# Patient Record
Sex: Male | Born: 1950
Health system: Southern US, Community
[De-identification: ages and names within clinical notes are randomized; demographics above are authoritative.]

## PROBLEM LIST (undated history)

## (undated) DIAGNOSIS — C3490 Malignant neoplasm of unspecified part of unspecified bronchus or lung: Secondary | ICD-10-CM

## (undated) DIAGNOSIS — T8859XA Other complications of anesthesia, initial encounter: Secondary | ICD-10-CM

## (undated) DIAGNOSIS — F1910 Other psychoactive substance abuse, uncomplicated: Secondary | ICD-10-CM

## (undated) DIAGNOSIS — I10 Essential (primary) hypertension: Secondary | ICD-10-CM

## (undated) DIAGNOSIS — T4145XA Adverse effect of unspecified anesthetic, initial encounter: Secondary | ICD-10-CM

## (undated) HISTORY — PX: BACK SURGERY: SHX140

## (undated) HISTORY — PX: ARM DEBRIDEMENT: SHX890

## (undated) HISTORY — DX: Malignant neoplasm of unspecified part of unspecified bronchus or lung: C34.90

---
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
## 2015-12-25 ENCOUNTER — Emergency Department
Admission: EM | Admit: 2015-12-25 | Discharge: 2015-12-25 | Disposition: A | Discharge status: Home / Self Care | Payer: Self-pay | Attending: Emergency Medicine | Admitting: Emergency Medicine

## 2015-12-25 ENCOUNTER — Encounter: Payer: Self-pay | Admitting: Emergency Medicine

## 2015-12-25 ENCOUNTER — Emergency Department: Payer: Self-pay

## 2015-12-25 DIAGNOSIS — F17200 Nicotine dependence, unspecified, uncomplicated: Secondary | ICD-10-CM | POA: Insufficient documentation

## 2015-12-25 DIAGNOSIS — L03011 Cellulitis of right finger: Secondary | ICD-10-CM | POA: Insufficient documentation

## 2015-12-25 DIAGNOSIS — R609 Edema, unspecified: Secondary | ICD-10-CM

## 2015-12-25 LAB — CBC WITH DIFFERENTIAL/PLATELET
BASOS ABS: 0 10*3/uL (ref 0–0.1)
BASOS PCT: 0 %
EOS PCT: 0 %
Eosinophils Absolute: 0 10*3/uL (ref 0–0.7)
HEMATOCRIT: 41.8 % (ref 40.0–52.0)
Hemoglobin: 14.8 g/dL (ref 13.0–18.0)
LYMPHS PCT: 9 %
Lymphs Abs: 1 10*3/uL (ref 1.0–3.6)
MCH: 34.6 pg — ABNORMAL HIGH (ref 26.0–34.0)
MCHC: 35.3 g/dL (ref 32.0–36.0)
MCV: 97.8 fL (ref 80.0–100.0)
MONO ABS: 0.9 10*3/uL (ref 0.2–1.0)
MONOS PCT: 8 %
NEUTROS ABS: 9.5 10*3/uL — AB (ref 1.4–6.5)
Neutrophils Relative %: 83 %
PLATELETS: 222 10*3/uL (ref 150–440)
RBC: 4.27 MIL/uL — ABNORMAL LOW (ref 4.40–5.90)
RDW: 12.4 % (ref 11.5–14.5)
WBC: 11.4 10*3/uL — ABNORMAL HIGH (ref 3.8–10.6)

## 2015-12-25 LAB — BASIC METABOLIC PANEL
ANION GAP: 7 (ref 5–15)
BUN: 11 mg/dL (ref 6–20)
CALCIUM: 9.3 mg/dL (ref 8.9–10.3)
CO2: 27 mmol/L (ref 22–32)
Chloride: 94 mmol/L — ABNORMAL LOW (ref 101–111)
Creatinine, Ser: 0.57 mg/dL — ABNORMAL LOW (ref 0.61–1.24)
GFR calc Af Amer: >60 mL/min (ref >60)
GLUCOSE: 106 mg/dL — AB (ref 65–99)
Potassium: 4.5 mmol/L (ref 3.5–5.1)
Sodium: 128 mmol/L — ABNORMAL LOW (ref 135–145)

## 2015-12-25 MED ORDER — IBUPROFEN 600 MG PO TABS: 600.0000 mg | ORAL_TABLET | Freq: Four times a day (QID) | ORAL | 0 refills | Status: DC | PRN

## 2015-12-25 MED ORDER — SODIUM CHLORIDE 0.9 % IV BOLUS (SEPSIS)
1000.0000 mL | Freq: Once | INTRAVENOUS | Status: AC
  Administered 2015-12-25: 1000 mL via INTRAVENOUS

## 2015-12-25 MED ORDER — CLINDAMYCIN PHOSPHATE 600 MG/50ML IV SOLN
600.0000 mg | Freq: Once | INTRAVENOUS | Status: AC
  Administered 2015-12-25: 600 mg via INTRAVENOUS

## 2015-12-25 MED ORDER — CLINDAMYCIN HCL 300 MG PO CAPS: 300.0000 mg | ORAL_CAPSULE | Freq: Three times a day (TID) | ORAL | 0 refills | Status: DC

## 2015-12-25 MED ORDER — CEPHALEXIN 500 MG PO CAPS: 500.0000 mg | ORAL_CAPSULE | Freq: Three times a day (TID) | ORAL | 0 refills | Status: AC

## 2015-12-25 MED ORDER — CLINDAMYCIN PHOSPHATE 600 MG/50ML IV SOLN
INTRAVENOUS | Status: AC
  Administered 2015-12-25: 600 mg via INTRAVENOUS
  Filled 2015-12-25: qty 50

## 2015-12-25 NOTE — ED Provider Notes (Signed)
[facility] Emergency Department Provider Note  ____________________________________________  Time seen: Approximately 10:19 AM  I have reviewed the triage vital signs and the nursing notes.   HISTORY  Chief Complaint Finger Injury    HPI Scott Jordan is a 65 y.o. male, NAD, presents to the emergency department with 1 week history of a painful, swollen, and red right middle finger.  Patient states that roughly 2 months ago he was stuck by a cactus needle at the end of the right middle digit and believes he may not have gotten the needle out.  Over the last week, he noticed that the site of where the cactus stuck him was draining pus and blood. Swelling, redness and pain about the finger increased over the last few days to the point it was impacting his work and his supervisor sent him to the ED for evaluation. Patient states that the pain decreases with elevation of his right hand.  He has tried icing, heating, and rinsing with salt water without relief of symptoms.  He reports decreased ROM of the right middle digit due to swelling, with full ROM of the surrounding digits and hand.  He denies fever, chills, body aches, abdominal pain, nausea, vomiting, numbness, weakness, tingling, chest pain, palpitations, nor shortness of breath. States that he works with his hands and has had multiple minor injuries to his hands and fingers.   History reviewed. No pertinent past medical history.  There are no active problems to display for this patient.   History reviewed. No pertinent surgical history.  Prior to Admission medications   Medication Sig Start Date End Date Taking? Authorizing Provider  cephALEXin (KEFLEX) 500 MG capsule Take 1 capsule (500 mg total) by mouth 3 (three) times daily. 12/25/15 01/04/16  Marqual Mi L Arletta Lumadue, PA-C  clindamycin (CLEOCIN) 300 MG capsule Take 1 capsule (300 mg total) by mouth 3 (three) times daily. 12/25/15   Monica Zahler L Dalanie Kisner, PA-C  ibuprofen  (ADVIL,MOTRIN) 600 MG tablet Take 1 tablet (600 mg total) by mouth every 6 (six) hours as needed. 12/25/15   Kace Hartje L Nafisa Olds, PA-C    Allergies Review of patient's allergies indicates no known allergies.  No family history on file.  Social History Social History  Substance Use Topics  . Smoking status: Current Every Day Smoker  . Smokeless tobacco: Never Used  . Alcohol use Not on file     Review of Systems  Constitutional: No fever/chills, Fatigue Cardiovascular: No chest pain. No palpitations. Respiratory: No shortness of breath.  Gastrointestinal:  No abdominal pain, nausea, vomiting Musculoskeletal: Positive for pain of the right middle digit causing decreased range of motion.  Negative for right shoulder, elbow, wrist, hand pain. Skin: Positive for redness, swelling, skin sores of right middle phalanx. No rash. Neurological: Negative for numbness, weakness, tingling. 10-point ROS otherwise negative.  ____________________________________________   PHYSICAL EXAM:  VITAL SIGNS: ED Triage Vitals  Enc Vitals Group     BP 12/25/15 0949 (!) 173/110     Pulse Rate 12/25/15 0949 63     Resp 12/25/15 0949 17     Temp 12/25/15 0949 98.4 F (36.9 C)     Temp Source 12/25/15 0949 Oral     SpO2 12/25/15 0949 99 %     Weight 12/25/15 0942 155 lb (70.3 kg)     Height 12/25/15 0942 '6\' 2"'$  (1.88 m)     Head Circumference --      Peak Flow --      Pain  Score 12/25/15 0942 10     Pain Loc --      Pain Edu? --      Excl. in [place]? --      Constitutional: Alert and oriented. Well appearing and in no acute distress. Eyes: Conjunctiva without icterus or injection bilaterally. Neck: Supple with full range of motion. Hematological/Lymphatic/Immunilogical: Tender, enlarged, mobile lymph node of right, medial elbow. No cervical, supraclavicular lymphadenopathy Cardiovascular: Good peripheral circulation with 2+ pulses noted in the right upper extremity. Capillary refill is brisk in all  digits of right hand. Respiratory: Normal respiratory effort without tachypnea or retractions.  Musculoskeletal: Tenderness on palpation of right middle digit without crepitus or bony abnormalities.  Decreased ROM of right middle digit due to pain and swelling. Full range of motion of the right middle MCP joint. Full range of motion of the right wrist and hand without pain. Full range of motion of the right elbow and shoulder without pain or difficulty. Neurologic:  Normal speech and language. No gross focal neurologic deficits are appreciated. Gait and posture are normal. Skin:  Skin of right middle digit is erythematous and edematous with 2 small hypopigmented areas near the distal phalangeal joint. Open wound with pus drainage on dorsal surface of distal right middle digit. No rash.   Psychiatric: Mood and affect are normal. Speech and behavior are normal. Patient exhibits appropriate insight and judgement.   ____________________________________________   LABS (all labs ordered are listed, but only abnormal results are displayed)  Labs Reviewed  BASIC METABOLIC PANEL - Abnormal; Notable for the following:       Result Value   Sodium 128 (*)    Chloride 94 (*)    Glucose, Bld 106 (*)    Creatinine, Ser 0.57 (*)    All other components within normal limits  CBC WITH DIFFERENTIAL/PLATELET - Abnormal; Notable for the following:    WBC 11.4 (*)    RBC 4.27 (*)    MCH 34.6 (*)    Neutro Abs 9.5 (*)    All other components within normal limits  AEROBIC CULTURE (SUPERFICIAL SPECIMEN)   ____________________________________________  EKG  None ____________________________________________  RADIOLOGY I have personally viewed and evaluated these images (plain radiographs) as part of my medical decision making, as well as reviewing the written report by the radiologist.  Dg Finger Middle Right  Result Date: 12/25/2015 CLINICAL DATA:  Right middle finger stuck with a cactus plant months  ago. Red and swollen. EXAM: RIGHT MIDDLE FINGER 2+V COMPARISON:  None. FINDINGS: There is no evidence of fracture or dislocation. There is mild osteoarthritis of the third DIP joint. Severe soft tissue swelling around the third DIP joint. IMPRESSION: Severe soft tissue swelling around the third DIP joint. No acute osseous injury of the right third digit. Electronically Signed   By: Kathreen Devoid   On: 12/25/2015 10:44    ____________________________________________    PROCEDURES  Procedure(s) performed: None   Procedures   Medications  clindamycin (CLEOCIN) IVPB 600 mg (0 mg Intravenous Stopped 12/25/15 1126)  sodium chloride 0.9 % bolus 1,000 mL (0 mLs Intravenous Stopped 12/25/15 1258)     ____________________________________________   INITIAL IMPRESSION / ASSESSMENT AND PLAN / ED COURSE  Pertinent labs & imaging results that were available during my care of the patient were reviewed by me and considered in my medical decision making (see chart for details).  Clinical Course    Patient's diagnosis is consistent with cellulitis of right middle finger. Wound culture was taken  prior to start of IV antibiotics and sent to the lab.  Patient will be discharged home with prescriptions for Keflex, clindamycin, ibuprofen to take as directed. Patient is to keep the digit elevated and limit any strong gripping with the right hand. Patient is to follow up with [facility] in 48 hours for wound recheck. Patient states that he has been seen at a medical practice and [facility]. Her first follow-up with them. I did discuss that he needs to have reevaluation in a medical facility on Sunday or Monday of this week to ensure that he is improving. Discussion was had with the patient that if he is not improving or he notes any worsening that he will need to see a hand surgeon for further treatment and he verbalized understanding of such.  Patient is given ED precautions to return to the  ED for any worsening or new symptoms.    ____________________________________________  FINAL CLINICAL IMPRESSION(S) / ED DIAGNOSES  Final diagnoses:  Swelling  Cellulitis of middle finger, right      NEW MEDICATIONS STARTED DURING THIS VISIT:  Discharge Medication List as of 12/25/2015 12:46 PM    START taking these medications   Details  cephALEXin (KEFLEX) 500 MG capsule Take 1 capsule (500 mg total) by mouth 3 (three) times daily., Starting Fri 12/25/2015, Until Mon 01/04/2016, Print    clindamycin (CLEOCIN) 300 MG capsule Take 1 capsule (300 mg total) by mouth 3 (three) times daily., Starting Fri 12/25/2015, Print    ibuprofen (ADVIL,MOTRIN) 600 MG tablet Take 1 tablet (600 mg total) by mouth every 6 (six) hours as needed., Starting Fri 12/25/2015, Print             Judithe Modest [place], PA-C 12/25/15 1416    Schuyler Amor, MD 12/25/15 1534

## 2015-12-25 NOTE — ED Notes (Signed)
Patient presents to the ED with pain and swelling to middle finger of his right hand.  Patient states he had a cactus thorn stuck in finger x 1 month.  Patient states he has attempted to remove thorn but he has not been able to fully remove it.  Finger appears very reddened and swollen.

## 2015-12-25 NOTE — ED Triage Notes (Signed)
Pain and swelling to right middle finger.  Pt states he got a cactus stuck in it recently

## 2015-12-28 LAB — AEROBIC CULTURE W GRAM STAIN (SUPERFICIAL SPECIMEN): Special Requests: NORMAL

## 2019-01-10 DIAGNOSIS — I4891 Unspecified atrial fibrillation: Secondary | ICD-10-CM

## 2019-01-10 HISTORY — DX: Unspecified atrial fibrillation: I48.91

## 2019-01-13 ENCOUNTER — Encounter: Payer: Self-pay | Admitting: Emergency Medicine

## 2019-01-13 ENCOUNTER — Other Ambulatory Visit: Payer: Self-pay

## 2019-01-13 ENCOUNTER — Observation Stay
Admission: EM | Admit: 2019-01-13 | Discharge: 2019-01-15 | Disposition: A | Discharge status: Home / Self Care | Payer: Medicare Other | Attending: Internal Medicine | Admitting: Internal Medicine

## 2019-01-13 ENCOUNTER — Emergency Department: Payer: Medicare Other

## 2019-01-13 DIAGNOSIS — I10 Essential (primary) hypertension: Secondary | ICD-10-CM | POA: Diagnosis not present

## 2019-01-13 DIAGNOSIS — T401X1A Poisoning by heroin, accidental (unintentional), initial encounter: Principal | ICD-10-CM

## 2019-01-13 DIAGNOSIS — I2489 Other forms of acute ischemic heart disease: Secondary | ICD-10-CM

## 2019-01-13 DIAGNOSIS — F17210 Nicotine dependence, cigarettes, uncomplicated: Secondary | ICD-10-CM | POA: Insufficient documentation

## 2019-01-13 DIAGNOSIS — I110 Hypertensive heart disease with heart failure: Secondary | ICD-10-CM | POA: Insufficient documentation

## 2019-01-13 DIAGNOSIS — Z7982 Long term (current) use of aspirin: Secondary | ICD-10-CM | POA: Diagnosis not present

## 2019-01-13 DIAGNOSIS — I509 Heart failure, unspecified: Secondary | ICD-10-CM | POA: Diagnosis not present

## 2019-01-13 DIAGNOSIS — Z23 Encounter for immunization: Secondary | ICD-10-CM | POA: Insufficient documentation

## 2019-01-13 DIAGNOSIS — I248 Other forms of acute ischemic heart disease: Secondary | ICD-10-CM | POA: Diagnosis not present

## 2019-01-13 DIAGNOSIS — Z681 Body mass index (BMI) 19 or less, adult: Secondary | ICD-10-CM | POA: Insufficient documentation

## 2019-01-13 DIAGNOSIS — E440 Moderate protein-calorie malnutrition: Secondary | ICD-10-CM | POA: Insufficient documentation

## 2019-01-13 DIAGNOSIS — I4892 Unspecified atrial flutter: Secondary | ICD-10-CM | POA: Diagnosis not present

## 2019-01-13 DIAGNOSIS — R000 Tachycardia, unspecified: Secondary | ICD-10-CM | POA: Diagnosis not present

## 2019-01-13 DIAGNOSIS — R0689 Other abnormalities of breathing: Secondary | ICD-10-CM | POA: Diagnosis not present

## 2019-01-13 DIAGNOSIS — T50904A Poisoning by unspecified drugs, medicaments and biological substances, undetermined, initial encounter: Secondary | ICD-10-CM | POA: Diagnosis not present

## 2019-01-13 DIAGNOSIS — I340 Nonrheumatic mitral (valve) insufficiency: Secondary | ICD-10-CM | POA: Insufficient documentation

## 2019-01-13 DIAGNOSIS — R778 Other specified abnormalities of plasma proteins: Secondary | ICD-10-CM | POA: Diagnosis not present

## 2019-01-13 DIAGNOSIS — Z7151 Drug abuse counseling and surveillance of drug abuser: Secondary | ICD-10-CM | POA: Insufficient documentation

## 2019-01-13 DIAGNOSIS — Z20828 Contact with and (suspected) exposure to other viral communicable diseases: Secondary | ICD-10-CM | POA: Diagnosis not present

## 2019-01-13 DIAGNOSIS — F1910 Other psychoactive substance abuse, uncomplicated: Secondary | ICD-10-CM | POA: Diagnosis not present

## 2019-01-13 DIAGNOSIS — T887XXA Unspecified adverse effect of drug or medicament, initial encounter: Secondary | ICD-10-CM | POA: Diagnosis not present

## 2019-01-13 HISTORY — DX: Other psychoactive substance abuse, uncomplicated: F19.10

## 2019-01-13 LAB — CBC
HCT: 40.6 % (ref 39.0–52.0)
Hemoglobin: 14 g/dL (ref 13.0–17.0)
MCH: 34.5 pg — ABNORMAL HIGH (ref 26.0–34.0)
MCHC: 34.5 g/dL (ref 30.0–36.0)
MCV: 100 fL (ref 80.0–100.0)
Platelets: 242 10*3/uL (ref 150–400)
RBC: 4.06 MIL/uL — ABNORMAL LOW (ref 4.22–5.81)
RDW: 13.2 % (ref 11.5–15.5)
WBC: 8.7 10*3/uL (ref 4.0–10.5)
nRBC: 0 % (ref 0.0–0.2)

## 2019-01-13 LAB — BASIC METABOLIC PANEL
Anion gap: 12 (ref 5–15)
BUN: 20 mg/dL (ref 8–23)
CO2: 23 mmol/L (ref 22–32)
Calcium: 9 mg/dL (ref 8.9–10.3)
Chloride: 99 mmol/L (ref 98–111)
Creatinine, Ser: 0.74 mg/dL (ref 0.61–1.24)
GFR calc Af Amer: >60 mL/min (ref >60)
GFR calc non Af Amer: >60 mL/min (ref >60)
Glucose, Bld: 139 mg/dL — ABNORMAL HIGH (ref 70–99)
Potassium: 3.6 mmol/L (ref 3.5–5.1)
Sodium: 134 mmol/L — ABNORMAL LOW (ref 135–145)

## 2019-01-13 LAB — TROPONIN I (HIGH SENSITIVITY): Troponin I (High Sensitivity): 30 ng/L — ABNORMAL HIGH (ref <18)

## 2019-01-13 MED ORDER — SODIUM CHLORIDE 0.9 % IV BOLUS
1000.0000 mL | Freq: Once | INTRAVENOUS | Status: AC
Start: 2019-01-13 — End: 2019-01-13
  Administered 2019-01-13: 22:00:00 1000 mL via INTRAVENOUS

## 2019-01-13 MED ORDER — ASPIRIN 81 MG PO CHEW
324.0000 mg | CHEWABLE_TABLET | Freq: Once | ORAL | Status: AC
  Administered 2019-01-14: 324 mg via ORAL
  Filled 2019-01-13: qty 4

## 2019-01-13 MED ORDER — METOPROLOL SUCCINATE ER 50 MG PO TB24
25.0000 mg | ORAL_TABLET | Freq: Once | ORAL | Status: AC
  Administered 2019-01-14: 25 mg via ORAL
  Filled 2019-01-13: qty 1

## 2019-01-13 MED ORDER — METOPROLOL SUCCINATE ER 25 MG PO TB24: 25.0000 mg | ORAL_TABLET | Freq: Every day | ORAL | 0 refills | Status: DC

## 2019-01-13 MED ORDER — ASPIRIN EC 81 MG PO TBEC: 81.0000 mg | DELAYED_RELEASE_TABLET | Freq: Every day | ORAL | 0 refills | Status: AC

## 2019-01-13 MED ORDER — NALOXONE HCL 4 MG/0.1ML NA LIQD: NASAL | 0 refills | Status: DC

## 2019-01-13 MED ORDER — METOPROLOL TARTRATE 5 MG/5ML IV SOLN
5.0000 mg | Freq: Once | INTRAVENOUS | Status: AC
  Administered 2019-01-13: 5 mg via INTRAVENOUS
  Filled 2019-01-13: qty 5

## 2019-01-13 MED ORDER — SODIUM CHLORIDE 0.9 % IV BOLUS
1000.0000 mL | Freq: Once | INTRAVENOUS | Status: AC
  Administered 2019-01-13: 1000 mL via INTRAVENOUS

## 2019-01-13 NOTE — Discharge Instructions (Addendum)
Opioid Overdose Opioids are drugs that are often used to treat pain. Opioids include illegal drugs, such as heroin, as well as prescription pain medicines, such as codeine, morphine, hydrocodone, oxycodone, and fentanyl. An opioid overdose happens when you take too much of an opioid. An overdose may be intentional or accidental and can happen with any type of opioid. The effects of an overdose can be mild, dangerous, or even deadly. Opioid overdose is a medical emergency. What are the causes? This condition may be caused by:  Taking too much of an opioid on purpose.  Taking too much of an opioid by accident.  Using two or more substances that contain opioids at the same time.  Taking an opioid with a substance that affects your heart, breathing, or blood pressure. These include alcohol, tranquilizers, sleeping pills, illegal drugs, and some over-the-counter medicines. This condition may also happen due to an error made by:  A health care provider who prescribes a medicine.  The pharmacist who fills the prescription order. What increases the risk? This condition is more likely in:  Children. They may be attracted to colorful pills. Because of a child's small size, even a small amount of a drug can be dangerous.  Older people. They may be taking many different drugs. Older people may have difficulty reading labels or remembering when they last took their medicine. They may also be more sensitive to the effects of opioids.  People with chronic medical conditions, especially heart, liver, kidney, or neurological diseases.  People who take an opioid for a long period of time.  People who use: ? Illegal drugs. IV heroin is especially dangerous. ? Other substances, including alcohol, while using an opioid.  People who have: ? A history of drug or alcohol abuse. ? Certain mental health conditions. ? A history of previous drug overdoses.  People who take opioids that are not  prescribed for them. What are the signs or symptoms? Symptoms of this condition depend on the type of opioid and the amount that was taken. Common symptoms include:  Sleepiness or difficulty waking from sleep.  Decrease in attention.  Confusion.  Slurred speech.  Slowed breathing and a slow pulse (bradycardia).  Nausea and vomiting.  Abnormally small pupils. Signs and symptoms that require emergency treatment include:  Cold, clammy, and pale skin.  Blue lips and fingernails.  Vomiting.  Gurgling sounds in the throat.  A pulse that is very slow or difficult to detect.  Breathing that is very irregular, slow, noisy, or difficult to detect.  Limp body.  Inability to respond to speech or be awakened from sleep (stupor).  Seizures. How is this diagnosed? This condition is diagnosed based on your symptoms and medical history. It is important to tell your health care provider:  About all of the opioids that you took.  When you took the opioids.  Whether you were drinking alcohol or using marijuana, cocaine, or other drugs. Your health care provider will do a physical exam. This exam may include:  Checking and monitoring your heart rate and rhythm, breathing rate, temperature, and blood pressure (vital signs).  Measuring oxygen levels in your blood.  Checking for abnormally small pupils. You may also have blood tests or urine tests. You may have X-rays if you are having severe breathing problems. How is this treated? This condition requires immediate medical treatment and hospitalization. Treatment is given in the hospital intensive care (ICU) setting. Supporting your vital signs and your breathing is the first step  in treating an opioid overdose. Treatment may also include:  Giving salts and minerals (electrolytes) along with fluids through an IV.  Inserting a breathing tube (endotracheal tube) in your airway to help you breathe if you cannot breathe on your own or  you are in danger of not being able to breathe on your own.  Giving oxygen through a small tube under your nose.  Passing a tube through your nose and into your stomach (nasogastric tube, or NG tube) to empty your stomach.  Giving medicines that: ? Increase your blood pressure. ? Relieve nausea and vomiting. ? Relieve abdominal pain and cramping. ? Reverse the effects of the opioid (naloxone).  Monitoring your heart and oxygen levels.  Ongoing counseling and mental health support if you intentionally overdosed or used an illegal drug. Follow these instructions at home:  Medicines  Take over-the-counter and prescription medicines only as told by your health care provider.  Always ask your health care provider about possible side effects and interactions of any new medicine that you start taking.  Keep a list of all the medicines that you take, including over-the-counter medicines. Bring this list with you to all your medical visits. General instructions  Drink enough fluid to keep your urine pale yellow.  Keep all follow-up visits as told by your health care provider. This is important. How is this prevented?  Read the drug inserts that come with your opioid pain medicines.  Take medicines only as told by your health care provider. Do not take more medicine than you are told. Do not take medicines more frequently than you are told.  Do not drink alcohol or take sedatives when taking opioids.  Do not use illegal or recreational drugs, including cocaine, ecstasy, and marijuana.  Do not take opioid medicines that are not prescribed for you.  Store all medicines in safety containers that are out of the reach of children.  Get help if you are struggling with: ? Alcohol or drug use. ? Depression or another mental health problem. ? Thoughts of hurting yourself or another person.  Keep the phone number of your local poison control center near your phone or in your mobile phone.  In the [place] the hotline of the [facility] is 2895481735.  If you were prescribed naloxone, make sure you understand how to take it. Contact a health care provider if you:  Need help understanding how to take your pain medicines.  Feel your medicines are too strong.  Are concerned that your pain medicines are not working well for your pain.  Develop new symptoms or side effects when you are taking medicines. Get help right away if:  You or someone else is having symptoms of an opioid overdose. Get help even if you are not sure.  You have serious thoughts about hurting yourself or others.  You have: ? Chest pain. ? Difficulty breathing. ? A loss of consciousness. These symptoms may represent a serious problem that is an emergency. Do not wait to see if the symptoms will go away. Get medical help right away. Call your local emergency services (911 in the [place] Do not drive yourself to the hospital. If you ever feel like you may hurt yourself or others, or have thoughts about taking your own life, get help right away. You can go to your nearest emergency department or call:  Your local emergency services (911 in the [place]  A suicide crisis helpline, such as the [facility]  at 3215916281. This is open 24 hours a day. Summary  Opioids are drugs that are often used to treat pain. Opioids include illegal drugs, such as heroin, as well as prescription pain medicines.  An opioid overdose happens when you take too much of an opioid.  Overdoses can be intentional or accidental.  Opioid overdose is very dangerous. It is a life-threatening emergency.  If you or someone you know is experiencing an opioid overdose, get help right away. This information is not intended to replace advice given to you by your health care provider. Make sure you discuss any questions you have with your health care provider. Document Released: 05/05/2004  Document Revised: 03/15/2018 Document Reviewed: 03/15/2018 Elsevier Patient Education  [employer]. It was so nice to meet you during this hospitalization!  You came into the hospital with a drug overdose. We gave you some narcan, which caused your heart rate to beat very fast. We started a medication called diltiazem to help with your heart rate.  -Please take diltiazem (cardizem) 180mg  daily  -Please take aspirin 81mg  daily  You should follow-up with the heart doctor in 1-2 weeks.  Take care, Dr. Brett Albino

## 2019-01-13 NOTE — ED Provider Notes (Addendum)
[facility] Emergency Department Provider Note  ____________________________________________   First MD Initiated Contact with Patient 01/13/19 2106     (approximate)  I have reviewed the triage vital signs and the nursing notes.  History  Chief Complaint Drug Overdose    HPI Scott Jordan is a 68 y.o. male with history of substance abuse, otherwise with no known medical history, who presents to the emergency department for a heroin overdose.  Per report, patient was with friends earlier this evening drinking and using heroin, when he became unresponsive. On EMS arrival he was apneic, and required bagging.  Strong pulse throughout.  Given 2 mg of intranasal Narcan with good response.  On arrival to the emergency department he is awake and alert, noted to be tachycardic, but has no acute complaints.  He denies any chest pain, shortness of breath, nausea, vomiting.  He denies using heroin with the intent of self-harm.  He denies any history of arrhythmia.   Past Medical Hx Past Medical History:  Diagnosis Date  . Substance abuse ([place])     Problem List There are no active problems to display for this patient.   Past Surgical Hx Past Surgical History:  Procedure Laterality Date  . BACK SURGERY      Medications Prior to Admission medications   Medication Sig Start Date End Date Taking? Authorizing Provider  aspirin 81 MG chewable tablet Chew by mouth daily.   Yes [provider]    Allergies Patient has no known allergies.  Family Hx History reviewed. No pertinent family history.  Social Hx Social History   Tobacco Use  . Smoking status: Current Every Day Smoker    Packs/day: 1.00    Types: Cigarettes  . Smokeless tobacco: Never Used  Substance Use Topics  . Alcohol use: Yes  . Drug use: Yes    Comment: heroin     Review of Systems  Constitutional: Negative for fever, chills. Eyes: Negative for visual changes. ENT: Negative  for sore throat. Cardiovascular: Negative for chest pain. Respiratory: Negative for shortness of breath. Gastrointestinal: Negative for nausea, vomiting.  Genitourinary: Negative for dysuria. Musculoskeletal: Negative for leg swelling. Skin: Negative for rash. Neurological: Negative for for headaches.   Physical Exam  Vital Signs: ED Triage Vitals  Enc Vitals Group     BP 01/13/19 2130 (!) 204/111     Pulse Rate 01/13/19 2105 (!) 135     Resp 01/13/19 2105 17     Temp 01/13/19 2105 (!) 97.4 F (36.3 C)     Temp Source 01/13/19 2105 Oral     SpO2 01/13/19 2105 99 %     Weight 01/13/19 2112 148 lb (67.1 kg)     Height 01/13/19 2112 6\' 3"  (1.905 m)     Head Circumference --      Peak Flow --      Pain Score --      Pain Loc --      Pain Edu? --      Excl. in [place]? --     Constitutional: Alert and oriented. Appears older than stated age.  Head: Normocephalic. Atraumatic. Eyes: Conjunctivae clear. Sclera anicteric. Nose: No congestion. No rhinorrhea. Mouth/Throat: Mucous membranes are slightly dry.  Neck: No stridor.   Cardiovascular: Tachycardic, irregular. Extremities well perfused. Respiratory: Normal respiratory effort.  Lungs CTAB. Gastrointestinal: Soft. Non-tender. Non-distended.  Musculoskeletal: No lower extremity edema. No deformities. Neurologic:  Normal speech and language. No gross focal neurologic deficits are appreciated.  Skin: Skin is warm, dry and intact. No rash noted. Psychiatric: Mood and affect are appropriate for situation.  EKG  Personally reviewed.   Initial EKG: Rate: 130s Rhythm: atrial flutter Axis: leftward Intervals: prolonged QTc Atrial flutter with RVR  Repeat after 5 mg IV metoprolol: Rate: 100s Rhythm: atrial flutter Axis: leftward Intervals: prolonged QTc Non-specific ST changes, more evident in inferior leads (though changes even within the lead itself), likely related to underlying flutter Atrial flutter with improvement in  HR   Radiology  XR: IMPRESSION:  Mild cardiomegaly.    Procedures  Procedure(s) performed (including critical care):  .Critical Care Performed by: Lilia Pro., MD Authorized by: Lilia Pro., MD   Critical care provider statement:    Critical care time (minutes):  45   Critical care was necessary to treat or prevent imminent or life-threatening deterioration of the following conditions: atrial flutter w/ RVR.   Critical care was time spent personally by me on the following activities:  Discussions with consultants, evaluation of patient's response to treatment, examination of patient, ordering and performing treatments and interventions, ordering and review of laboratory studies, ordering and review of radiographic studies, pulse oximetry, re-evaluation of patient's condition, obtaining history from patient or surrogate and review of old charts     Initial Impression / Assessment and Plan / ED Course  68 y.o. male who presents to the ED after heroin overdose, required IN Narcan with EMS. Arrives awake and alert in no respiratory distress, found to be in new onset atrial flutter. Asymptomatic. As above.  HDS. Will give IV fluids and dose of metoprolol and reassess. Basic labs.   After 5 mg IV metoprolol, good response in the heart rate to the 90s/low 100s.  Remains hemodynamically stable.  Discussed with cardiology.  Will initiate on metoprolol succinate and daily aspirin and plan for follow-up in the cardiology clinic.  Will elect for aspirin anticoagulation for now, given his history of non-compliance and substance use, he is not an appropriate candidate for Eliquis or other oral anticoagulation at this time. Labs without significant derangements. HS troponin minimally elevated, likely 2/2 to his overdose/apnea and tachycardia.   We will plan to observe for approximately 4 hours to ensure no further respiratory depression, if he remains stable we will plan for discharge with  metoprolol, aspirin, and cardiology follow-up.   Final Clinical Impression(s) / ED Diagnosis  Final diagnoses:  New onset atrial flutter ([place])  Accidental overdose of heroin, initial encounter [facility])       Note:  This document was prepared using Dragon voice recognition software and may include unintentional dictation errors.   Lilia Pro., MD 01/14/19 [id number]    Lilia Pro., MD 01/14/19 [phone]

## 2019-01-13 NOTE — ED Notes (Signed)
Dr Joan Mayans at bedside; pt chatty; denies pain

## 2019-01-13 NOTE — ED Notes (Signed)
Pt's niece at bedside; she tells pt she doesn't want to 2 uncles in 74 days; pt lost his brother in July; says he has been depressed, but not suicidal, since then; admits to drinking too much and using more heroin than usual;

## 2019-01-13 NOTE — ED Triage Notes (Signed)
EMS pt from home following a heroin overdose; EMS reports pt was apneic when they arrived; awakened only after Narcan intranasally; pt arrives awake and alert; talking in complete coherent sentences; pt says he's never overdosed before and is grateful for the help of the paramedics;

## 2019-01-14 ENCOUNTER — Observation Stay
Admit: 2019-01-14 | Discharge: 2019-01-14 | Disposition: A | Discharge status: Home / Self Care | Payer: Medicare Other | Attending: Internal Medicine | Admitting: Internal Medicine

## 2019-01-14 DIAGNOSIS — I10 Essential (primary) hypertension: Secondary | ICD-10-CM | POA: Diagnosis not present

## 2019-01-14 DIAGNOSIS — R778 Other specified abnormalities of plasma proteins: Secondary | ICD-10-CM | POA: Diagnosis present

## 2019-01-14 DIAGNOSIS — I4891 Unspecified atrial fibrillation: Secondary | ICD-10-CM | POA: Diagnosis not present

## 2019-01-14 DIAGNOSIS — I4892 Unspecified atrial flutter: Secondary | ICD-10-CM | POA: Diagnosis not present

## 2019-01-14 LAB — HIV ANTIBODY (ROUTINE TESTING W REFLEX): HIV Screen 4th Generation wRfx: NONREACTIVE

## 2019-01-14 LAB — ECHOCARDIOGRAM COMPLETE
Height: 75 in
Weight: 2230.4 oz

## 2019-01-14 LAB — SARS CORONAVIRUS 2 (TAT 6-24 HRS): SARS Coronavirus 2: NEGATIVE

## 2019-01-14 LAB — TROPONIN I (HIGH SENSITIVITY): Troponin I (High Sensitivity): 43 ng/L — ABNORMAL HIGH (ref <18)

## 2019-01-14 LAB — TSH: TSH: 2.285 u[IU]/mL (ref 0.350–4.500)

## 2019-01-14 LAB — MAGNESIUM: Magnesium: 2.1 mg/dL (ref 1.7–2.4)

## 2019-01-14 LAB — PHOSPHORUS: Phosphorus: 3.1 mg/dL (ref 2.5–4.6)

## 2019-01-14 MED ORDER — THIAMINE HCL 100 MG/ML IJ SOLN: 100.0000 mg | Freq: Every day | INTRAMUSCULAR | Status: DC

## 2019-01-14 MED ORDER — LORAZEPAM 2 MG/ML IJ SOLN
0.0000 mg | Freq: Four times a day (QID) | INTRAMUSCULAR | Status: DC
  Administered 2019-01-14: 1 mg via INTRAVENOUS
  Filled 2019-01-14: qty 1

## 2019-01-14 MED ORDER — INFLUENZA VAC A&B SA ADJ QUAD 0.5 ML IM PRSY
0.5000 mL | PREFILLED_SYRINGE | INTRAMUSCULAR | Status: AC
  Administered 2019-01-15: 0.5 mL via INTRAMUSCULAR
  Filled 2019-01-14: qty 0.5

## 2019-01-14 MED ORDER — SODIUM CHLORIDE 0.9 % IV SOLN
INTRAVENOUS | Status: DC
  Administered 2019-01-14: 16:00:00 via INTRAVENOUS
  Administered 2019-01-14: 1000 mL via INTRAVENOUS
  Administered 2019-01-15: 02:00:00 via INTRAVENOUS

## 2019-01-14 MED ORDER — ONDANSETRON HCL 4 MG/2ML IJ SOLN
4.0000 mg | Freq: Once | INTRAMUSCULAR | Status: AC
  Administered 2019-01-14: 4 mg via INTRAVENOUS
  Filled 2019-01-14: qty 2

## 2019-01-14 MED ORDER — LORAZEPAM 2 MG/ML IJ SOLN: 1.0000 mg | INTRAMUSCULAR | Status: DC | PRN

## 2019-01-14 MED ORDER — ENSURE ENLIVE PO LIQD
237.0000 mL | Freq: Two times a day (BID) | ORAL | Status: DC
  Administered 2019-01-15: 237 mL via ORAL

## 2019-01-14 MED ORDER — DILTIAZEM HCL 25 MG/5ML IV SOLN
15.0000 mg | Freq: Once | INTRAVENOUS | Status: AC
  Administered 2019-01-14: 15 mg via INTRAVENOUS
  Filled 2019-01-14: qty 5

## 2019-01-14 MED ORDER — ONDANSETRON HCL 4 MG PO TABS: 4.0000 mg | ORAL_TABLET | Freq: Four times a day (QID) | ORAL | Status: DC | PRN

## 2019-01-14 MED ORDER — VITAMIN B-1 100 MG PO TABS
100.0000 mg | ORAL_TABLET | Freq: Every day | ORAL | Status: DC
  Administered 2019-01-14 – 2019-01-15 (×2): 100 mg via ORAL
  Filled 2019-01-14 (×2): qty 1

## 2019-01-14 MED ORDER — DILTIAZEM HCL ER COATED BEADS 180 MG PO CP24
180.0000 mg | ORAL_CAPSULE | Freq: Once | ORAL | Status: AC
  Administered 2019-01-14: 180 mg via ORAL
  Filled 2019-01-14: qty 1

## 2019-01-14 MED ORDER — DILTIAZEM HCL ER COATED BEADS 180 MG PO CP24
180.0000 mg | ORAL_CAPSULE | Freq: Every day | ORAL | Status: DC
  Administered 2019-01-15: 180 mg via ORAL
  Filled 2019-01-14: qty 1

## 2019-01-14 MED ORDER — ACETAMINOPHEN 650 MG RE SUPP: 650.0000 mg | Freq: Four times a day (QID) | RECTAL | Status: DC | PRN

## 2019-01-14 MED ORDER — ACETAMINOPHEN 325 MG PO TABS
650.0000 mg | ORAL_TABLET | Freq: Four times a day (QID) | ORAL | Status: DC | PRN
  Administered 2019-01-15: 650 mg via ORAL
  Filled 2019-01-14: qty 2

## 2019-01-14 MED ORDER — LORAZEPAM 1 MG PO TABS
1.0000 mg | ORAL_TABLET | ORAL | Status: DC | PRN
  Administered 2019-01-15: 1 mg via ORAL
  Filled 2019-01-14: qty 1

## 2019-01-14 MED ORDER — ADULT MULTIVITAMIN W/MINERALS CH
1.0000 | ORAL_TABLET | Freq: Every day | ORAL | Status: DC
  Administered 2019-01-14 – 2019-01-15 (×2): 1 via ORAL
  Filled 2019-01-14 (×2): qty 1

## 2019-01-14 MED ORDER — LORAZEPAM 2 MG/ML IJ SOLN: 0.0000 mg | Freq: Two times a day (BID) | INTRAMUSCULAR | Status: DC

## 2019-01-14 MED ORDER — FOLIC ACID 1 MG PO TABS
1.0000 mg | ORAL_TABLET | Freq: Every day | ORAL | Status: DC
  Administered 2019-01-14 – 2019-01-15 (×2): 1 mg via ORAL
  Filled 2019-01-14 (×2): qty 1

## 2019-01-14 MED ORDER — ONDANSETRON HCL 4 MG/2ML IJ SOLN: 4.0000 mg | Freq: Four times a day (QID) | INTRAMUSCULAR | Status: DC | PRN

## 2019-01-14 MED ORDER — ENOXAPARIN SODIUM 40 MG/0.4ML ~~LOC~~ SOLN
40.0000 mg | SUBCUTANEOUS | Status: DC
  Administered 2019-01-14 – 2019-01-15 (×2): 40 mg via SUBCUTANEOUS
  Filled 2019-01-14 (×2): qty 0.4

## 2019-01-14 MED ORDER — DOCUSATE SODIUM 100 MG PO CAPS
100.0000 mg | ORAL_CAPSULE | Freq: Two times a day (BID) | ORAL | Status: DC
  Administered 2019-01-14 – 2019-01-15 (×2): 100 mg via ORAL
  Filled 2019-01-14 (×2): qty 1

## 2019-01-14 MED ORDER — LABETALOL HCL 5 MG/ML IV SOLN: 10.0000 mg | Freq: Four times a day (QID) | INTRAVENOUS | Status: DC | PRN

## 2019-01-14 MED ORDER — ASPIRIN 81 MG PO CHEW
81.0000 mg | CHEWABLE_TABLET | Freq: Every day | ORAL | Status: DC
  Administered 2019-01-14 – 2019-01-15 (×2): 81 mg via ORAL
  Filled 2019-01-14 (×2): qty 1

## 2019-01-14 MED ORDER — MAGNESIUM SULFATE 2 GM/50ML IV SOLN
2.0000 g | Freq: Once | INTRAVENOUS | Status: AC
  Administered 2019-01-14: 2 g via INTRAVENOUS
  Filled 2019-01-14: qty 50

## 2019-01-14 MED ORDER — PNEUMOCOCCAL VAC POLYVALENT 25 MCG/0.5ML IJ INJ
0.5000 mL | INJECTION | INTRAMUSCULAR | Status: AC
  Administered 2019-01-15: 0.5 mL via INTRAMUSCULAR
  Filled 2019-01-14: qty 0.5

## 2019-01-14 MED ORDER — NICOTINE 21 MG/24HR TD PT24
21.0000 mg | MEDICATED_PATCH | Freq: Every day | TRANSDERMAL | Status: DC
  Administered 2019-01-14 – 2019-01-15 (×2): 21 mg via TRANSDERMAL
  Filled 2019-01-14 (×2): qty 1

## 2019-01-14 NOTE — ED Notes (Signed)
In to draw repeat troponin; pt became nauseated and vomited 500cc;s liquid; MD aware and verbal order given

## 2019-01-14 NOTE — ED Notes (Signed)
Discussed CIWA and opiate withdrawal scale results with Dr Alfred Levins; acknowledged; no new orders given

## 2019-01-14 NOTE — Care Management Obs Status (Signed)
MEDICARE OBSERVATION STATUS NOTIFICATION   Patient Details  Name: Scott Jordan MRN: [id number] Date of Birth: 11-Jun-1950   Medicare Observation Status Notification Given:  Yes    Elza Rafter, RN 01/14/2019, 3:51 PM

## 2019-01-14 NOTE — ED Notes (Signed)
EKG ST with RBBB

## 2019-01-14 NOTE — ED Notes (Signed)
Attempted to call report on this pt. The RN explained that she was still getting report from night shift and to give her 5 mins and that she would call back.

## 2019-01-14 NOTE — ED Notes (Signed)
Pt resting in bed; given more ice chips; watching tv; no complaints

## 2019-01-14 NOTE — ED Provider Notes (Addendum)
-----------------------------------------   1:02 AM on 01/14/2019 -----------------------------------------   Blood pressure (!) 167/123, pulse (!) 130, temperature (!) 97.4 F (36.3 C), temperature source Oral, resp. rate 19, height 6\' 3"  (1.905 m), weight 67.1 kg, SpO2 95 %.  Assuming care from Dr. Joan Mayans of [place] is a 68 y.o. male with a chief complaint of Drug Overdose .    Please refer to H&P by previous MD for further details.  Patient remains in flutter with RVR after 5mg  of IV metoprolol and 25mg  of PO toprol. BP elevated. Will try IV cardizem and IV magnesium for rate control. No signs of withdrawal. Patient remains well appearing.  _________________________ 3:30 AM on 01/14/2019 -----------------------------------------  HR better controlled with cardizem, currently in the 90s. Repeat troponin went up from 30 to 43, will admit to hospitalist   ED ECG REPORT I, Rudene Re, the attending physician, personally viewed and interpreted this ECG.  Atrial flutter, rate of 69, prolonged QTC, left axis deviation, no STE. No prior for comparison   CRITICAL CARE Performed by: Rudene Re  ?  Total critical care time: 30 min  Critical care time was exclusive of separately billable procedures and treating other patients.  Critical care was necessary to treat or prevent imminent or life-threatening deterioration.  Critical care was time spent personally by me on the following activities: development of treatment plan with patient and/or surrogate as well as nursing, discussions with consultants, evaluation of patient's response to treatment, examination of patient, obtaining history from patient or surrogate, ordering and performing treatments and interventions, ordering and review of laboratory studies, ordering and review of radiographic studies, pulse oximetry and re-evaluation of patient's condition.         Alfred Levins, [place], MD 01/14/19 [place], [place], MD 01/14/19 [phone]

## 2019-01-14 NOTE — ED Notes (Signed)
Pt given urinal upon request

## 2019-01-14 NOTE — ED Notes (Signed)
EKG Atrial Flutter rate 133

## 2019-01-14 NOTE — Plan of Care (Signed)
  Problem: Education: Goal: Knowledge of General Education information will improve Description: Including pain rating scale, medication(s)/side effects and non-pharmacologic comfort measures 01/14/2019 1613 by Aubery Lapping, RN Outcome: Progressing 01/14/2019 1613 by Aubery Lapping, RN Outcome: Progressing   Problem: Health Behavior/Discharge Planning: Goal: Ability to manage health-related needs will improve 01/14/2019 1613 by Aubery Lapping, RN Outcome: Progressing 01/14/2019 1613 by Aubery Lapping, RN Outcome: Progressing   Problem: Clinical Measurements: Goal: Ability to maintain clinical measurements within normal limits will improve 01/14/2019 1613 by Aubery Lapping, RN Outcome: Progressing 01/14/2019 1613 by Aubery Lapping, RN Outcome: Progressing Goal: Will remain free from infection 01/14/2019 1613 by Aubery Lapping, RN Outcome: Progressing 01/14/2019 1613 by Aubery Lapping, RN Outcome: Progressing Goal: Diagnostic test results will improve 01/14/2019 1613 by Aubery Lapping, RN Outcome: Progressing 01/14/2019 1613 by Aubery Lapping, RN Outcome: Progressing Goal: Respiratory complications will improve 01/14/2019 1613 by Aubery Lapping, RN Outcome: Progressing 01/14/2019 1613 by Aubery Lapping, RN Outcome: Progressing Goal: Cardiovascular complication will be avoided 01/14/2019 1613 by Aubery Lapping, RN Outcome: Progressing 01/14/2019 1613 by Aubery Lapping, RN Outcome: Progressing   Problem: Activity: Goal: Risk for activity intolerance will decrease 01/14/2019 1613 by Aubery Lapping, RN Outcome: Progressing 01/14/2019 1613 by Aubery Lapping, RN Outcome: Progressing   Problem: Nutrition: Goal: Adequate nutrition will be maintained 01/14/2019 1613 by Aubery Lapping, RN Outcome: Progressing 01/14/2019 1613 by Aubery Lapping, RN Outcome: Progressing   Problem: Coping: Goal: Level  of anxiety will decrease 01/14/2019 1613 by Aubery Lapping, RN Outcome: Progressing 01/14/2019 1613 by Aubery Lapping, RN Outcome: Progressing   Problem: Elimination: Goal: Will not experience complications related to bowel motility 01/14/2019 1613 by Aubery Lapping, RN Outcome: Progressing 01/14/2019 1613 by Aubery Lapping, RN Outcome: Progressing Goal: Will not experience complications related to urinary retention 01/14/2019 1613 by Aubery Lapping, RN Outcome: Progressing 01/14/2019 1613 by Aubery Lapping, RN Outcome: Progressing   Problem: Pain Managment: Goal: General experience of comfort will improve 01/14/2019 1613 by Aubery Lapping, RN Outcome: Progressing 01/14/2019 1613 by Aubery Lapping, RN Outcome: Progressing   Problem: Safety: Goal: Ability to remain free from injury will improve 01/14/2019 1613 by Aubery Lapping, RN Outcome: Progressing 01/14/2019 1613 by Aubery Lapping, RN Outcome: Progressing   Problem: Skin Integrity: Goal: Risk for impaired skin integrity will decrease 01/14/2019 1613 by Aubery Lapping, RN Outcome: Progressing 01/14/2019 1613 by Aubery Lapping, RN Outcome: Progressing

## 2019-01-14 NOTE — H&P (Addendum)
Scott Jordan is an 68 y.o. male.   Chief Complaint: Drug overdose HPI: The patient with past medical history of substance abuse who has not seen doctors in years presents to the emergency department following a drug overdose.  He admits to drinking alcohol and using heroin.  He reportedly became unresponsive which prompted his friends to call EMS.  He was found to be apneic and given Narcan 2 mg IV.  The patient never lost a pulse and was found to be in atrial flutter with rapid ventricular rate upon arrival to the emergency department.  The patient denied chest pain but admitted to some palpitations.  He was placed on a Cardizem drip and given a dose of Cardizem orally.  His heart rate slowed but laboratory evaluation showed elevated troponins which prompted the emergency department staff to call the hospitalist service for admission.  Past Medical History:  Diagnosis Date  . Substance abuse [facility])     Past Surgical History:  Procedure Laterality Date  . BACK SURGERY      History reviewed. No pertinent family history.  Hypertension in various family members  Social History:  reports that he has been smoking cigarettes. He has been smoking about 1.00 pack per day. He has never used smokeless tobacco. He reports current alcohol use. He reports current drug use.  Allergies: No Known Allergies  Prior to Admission medications   Medication Sig Start Date End Date Taking? Authorizing Provider  aspirin 81 MG chewable tablet Chew 81 mg by mouth daily.    Yes [provider]  aspirin EC 81 MG tablet Take 1 tablet (81 mg total) by mouth daily. 01/13/19 03/14/19  Lilia Pro., MD  metoprolol succinate (TOPROL XL) 25 MG 24 hr tablet Take 1 tablet (25 mg total) by mouth daily. 01/13/19 03/14/19  Lilia Pro., MD  naloxone [facility]) nasal spray 4 mg/0.1 mL Spray in the nares for opiate overdose as needed. If you use this you MUST call EMS. 01/13/19   Lilia Pro., MD     Results for orders  placed or performed during the hospital encounter of 01/13/19 (from the past 48 hour(s))  Basic metabolic panel     Status: Abnormal   Collection Time: 01/13/19 10:26 PM  Result Value Ref Range   Sodium 134 (L) 135 - 145 mmol/L   Potassium 3.6 3.5 - 5.1 mmol/L   Chloride 99 98 - 111 mmol/L   CO2 23 22 - 32 mmol/L   Glucose, Bld 139 (H) 70 - 99 mg/dL   BUN 20 8 - 23 mg/dL   Creatinine, Ser 0.74 0.61 - 1.24 mg/dL   Calcium 9.0 8.9 - 10.3 mg/dL   GFR calc non Af Amer >60 >60 mL/min   GFR calc Af Amer >60 >60 mL/min   Anion gap 12 5 - 15    Comment: Performed at [facility], [address] [place], [place] [address]  CBC     Status: Abnormal   Collection Time: 01/13/19 10:26 PM  Result Value Ref Range   WBC 8.7 4.0 - 10.5 K/uL   RBC 4.06 (L) 4.22 - 5.81 MIL/uL   Hemoglobin 14.0 13.0 - 17.0 g/dL   HCT 40.6 39.0 - 52.0 %   MCV 100.0 80.0 - 100.0 fL   MCH 34.5 (H) 26.0 - 34.0 pg   MCHC 34.5 30.0 - 36.0 g/dL   RDW 13.2 11.5 - 15.5 %   Platelets 242 150 - 400 K/uL  nRBC 0.0 0.0 - 0.2 %    Comment: Performed at [facility], [place], [place] [address]  Troponin I (High Sensitivity)     Status: Abnormal   Collection Time: 01/13/19 10:26 PM  Result Value Ref Range   Troponin I (High Sensitivity) 30 (H) <18 ng/L    Comment: (NOTE) Elevated high sensitivity troponin I (hsTnI) values and significant  changes across serial measurements may suggest ACS but many other  chronic and acute conditions are known to elevate hsTnI results.  Refer to the "Links" section for chest pain algorithms and additional  guidance. Performed at [facility], [place], [place] [address]   Troponin I (High Sensitivity)     Status: Abnormal   Collection Time: 01/14/19  2:00 AM  Result Value Ref Range   Troponin I (High Sensitivity) 43 (H) <18 ng/L    Comment: (NOTE) Elevated high sensitivity troponin I (hsTnI) values and significant  changes  across serial measurements may suggest ACS but many other  chronic and acute conditions are known to elevate hsTnI results.  Refer to the "Links" section for chest pain algorithms and additional  guidance. Performed at [facility], [address] [place], [place] [address]    Dg Chest Port 1 View  Result Date: 01/13/2019 CLINICAL DATA:  Tachycardia EXAM: PORTABLE CHEST 1 VIEW COMPARISON:  None. FINDINGS: there is mild cardiomegaly. Both lungs are clear. The visualized skeletal structures are unremarkable. IMPRESSION: Mild cardiomegaly. Electronically Signed   By: Prudencio Pair M.D.   On: 01/13/2019 23:16    Review of Systems  Constitutional: Negative for chills and fever.  HENT: Negative for sore throat and tinnitus.   Eyes: Negative for blurred vision and redness.  Respiratory: Positive for cough. Negative for shortness of breath.   Cardiovascular: Negative for chest pain, palpitations, orthopnea and PND.  Gastrointestinal: Negative for abdominal pain, diarrhea, nausea and vomiting.  Genitourinary: Negative for dysuria, frequency and urgency.  Musculoskeletal: Negative for joint pain and myalgias.  Skin: Negative for rash.       No lesions  Neurological: Negative for speech change, focal weakness and weakness.  Endo/Heme/Allergies: Does not bruise/bleed easily.       No temperature intolerance  Psychiatric/Behavioral: Negative for depression and suicidal ideas.    Blood pressure (!) 166/87, pulse 82, temperature (!) 97.4 F (36.3 C), temperature source Oral, resp. rate 16, height 6\' 3"  (1.905 m), weight 67.1 kg, SpO2 94 %. Physical Exam  Vitals reviewed. Constitutional: He is oriented to person, place, and time. He appears well-developed and well-nourished. No distress.  HENT:  Head: Normocephalic and atraumatic.  Mouth/Throat: Oropharynx is clear and moist.  Eyes: Pupils are equal, round, and reactive to light. Conjunctivae and EOM are normal. No scleral icterus.   Neck: Normal range of motion. Neck supple. No JVD present. No tracheal deviation present. No thyromegaly present.  Cardiovascular: Normal rate, regular rhythm and normal heart sounds. Exam reveals no gallop and no friction rub.  No murmur heard. Respiratory: Effort normal and breath sounds normal. No respiratory distress.  GI: Soft. Bowel sounds are normal. He exhibits no distension. There is no abdominal tenderness.  Genitourinary:    Genitourinary Comments: Deferred   Musculoskeletal: Normal range of motion.        General: No edema.  Lymphadenopathy:    He has no cervical adenopathy.  Neurological: He is alert and oriented to person, place, and time. No cranial nerve deficit.  Skin: Skin  is warm and dry. No rash noted. No erythema.  Psychiatric: He has a normal mood and affect. His behavior is normal. Judgment and thought content normal.     Assessment/Plan This is a 68 year old male admitted for elevated troponin. 1.  Elevated troponin: Secondary to demand ischemia following prolonged tachycardia.  EKG showing atrial fibrillation without evidence of ischemia.  Continue to follow cardiac biomarkers.  Monitor telemetry.  Echocardiogram ordered.  Continue aspirin.  Consult cardiology. 2.  Atrial flutter: Rate controlled; continue Cardizem drip.  Plan as above. 3.  Hypertension: This is a new diagnosis; at some point the patient was taking metoprolol as an outpatient.  Now that he has calcium channel blocker load will defer to cardiology for optimal management of persistently elevated blood pressure. 4.  Alcohol abuse: CIWA scale.  Last drink was less than 24 hours ago.  Patient's drink of choice is small liquor. 5.  Tobacco abuse: NicoDerm patch while hospitalized 6.  DVT prophylaxis: Lovenox 7.  GI prophylaxis: None Patient is a full code.  Time spent on admission orders and patient care approximately 45 minutes  Harrie Foreman, MD 01/14/2019, 7:05 AM

## 2019-01-14 NOTE — Progress Notes (Signed)
Patient admitted early this morning.  Seen and examined by me later in the morning.  Please see H&P for further details.  Patient states he is overall feeling okay this morning.  He denies any chest pain, shortness of breath, or palpitations.  On exam, he is in no acute distress.  He has an irregularly irregular rhythm with a normal rate.  Lungs are clear to auscultation bilaterally.  New onset A. flutter with RVR-precipitated by Narcan -Received a dose of IV diltiazem in the ED, will continue diltiazem 180 mg daily -Cardiology consulted -ECHO ordered -Cardiac monitoring  Elevated troponin-likely due to demand ischemia in the setting of A. flutter with RVR.  No active chest pain. -Troponin 30 > 43 -ECHO ordered -Cardiology following  Hypertension- BP remains elevated today. Opiate and alcohol withdrawal likely contributing. -Started on diltiazem 180mg  daily -IV labetalol prn  Polysubstance abuse with heroin, alcohol, and tobacco -Continue CIWA -Nicotine patch ordered  Hyman Bible, MD

## 2019-01-14 NOTE — TOC Initial Note (Signed)
Transition of Care [facility]) - Initial/Assessment Note    Patient Details  Name: Scott Jordan MRN: [id number] Date of Birth: [birth year]  Transition of Care [facility]) CM/SW Contact:    Elza Rafter, RN Phone Number: 01/14/2019, 3:58 PM  Clinical Narrative:       From home alone.  Admitted with heroin overdose.  No PCP, Obtains medications at [facility] in [place].  Patient has [facility] Medicare.  MOON letter presented.  Will provide a list of PCP in the [place] area.  He states he knows who he wants in [address] just can't remember the name but he will find out and let this RNCM know so I can make follow up appointment.    Will also Provide substance abuse information.            Expected Discharge Plan: Home/Self Care Barriers to Discharge: Continued Medical Work up   Patient Goals and CMS Choice        Expected Discharge Plan and Services Expected Discharge Plan: Home/Self Care   Discharge Planning Services: CM Consult   Living arrangements for the past 2 months: Apartment                                      Prior Living Arrangements/Services Living arrangements for the past 2 months: Apartment Lives with:: Self                   Activities of Daily Living Home Assistive Devices/Equipment: None ADL Screening (condition at time of admission) Patient's cognitive ability adequate to safely complete daily activities?: Yes Is the patient deaf or have difficulty hearing?: No Does the patient have difficulty seeing, even when wearing glasses/contacts?: No Does the patient have difficulty concentrating, remembering, or making decisions?: No Patient able to express need for assistance with ADLs?: Yes Does the patient have difficulty dressing or bathing?: No Independently performs ADLs?: Yes (appropriate for developmental age) Does the patient have difficulty walking or climbing stairs?: No Weakness of Legs: None Weakness of Arms/Hands: None  Permission Sought/Granted                   Emotional Assessment Appearance:: Appears stated age Attitude/Demeanor/Rapport: Gracious Affect (typically observed): Accepting Orientation: : Oriented to Self, Oriented to Place, Oriented to  Time, Oriented to Situation Alcohol / Substance Use: Illicit Drugs Psych Involvement: No (comment)  Admission diagnosis:  Demand ischemia ([place]) [I24.8] Accidental overdose of heroin, initial encounter ([place]) [T40.1X1A] New onset atrial flutter ([place]) [I48.92] Patient Active Problem List   Diagnosis Date Noted  . Elevated troponin 01/14/2019   PCP:  Patient, No Pcp Per Pharmacy:   [id number] [address], [address] [place] [address] Phone: [phone] Fax: [phone]     Social Determinants of Health (SDOH) Interventions    Readmission Risk Interventions No flowsheet data found.

## 2019-01-14 NOTE — Consult Note (Signed)
Cardiology Consultation Note    Patient ID: Scott Jordan, MRN: [id number], DOB/AGE: February 29, 1951 68 y.o. Admit date: 01/13/2019   Date of Consult: 01/14/2019 Primary Physician: Patient, No Pcp Per Primary Cardiologist: NOne  Chief Complaint: afib Reason for Consultation: afib Requesting MD: Dr. Brett Albino  HPI: Scott Jordan is a 68 y.o. male with history of polysubstance abuse including IV heroin, alcohol, ethanol who presented to the emergency room with complaints that his friends found him unresponsive.  Patient denies any chest pain.  He was noted to be in atrial fibrillation rapid ventricular response.  He has no prior history of this.  He complains of left shoulder discomfort.  He is unaware of any trauma that may have occurred.  He has ruled out for myocardial infarction.  Serum troponins high-sensitivity were 30 and 43.  Telemetry currently shows transient sinus rhythm/sinus tachycardia followed by recurrent atrial fibrillation with rapid ventricular rate Scott Jordan.  He is apparently treated with aspirin and metoprolol succinate as an outpatient.  On admission he was continued with aspirin and started on thiamine and multivitamins IV.  He denies any chest pain.  He again has difficulty raising his left arm over his head but is unaware of any trauma that may have occurred resulting in this.  He has no primary care physician.  He does not have regular medical care.  Past Medical History:  Diagnosis Date  . Substance abuse [facility])       Surgical History:  Past Surgical History:  Procedure Laterality Date  . BACK SURGERY       Home Meds: Prior to Admission medications   Medication Sig Start Date End Date Taking? Authorizing Provider  aspirin 81 MG chewable tablet Chew 81 mg by mouth daily.    Yes [provider]  aspirin EC 81 MG tablet Take 1 tablet (81 mg total) by mouth daily. 01/13/19 03/14/19  Lilia Pro., MD  metoprolol succinate (TOPROL XL) 25 MG 24 hr tablet Take 1 tablet (25 mg  total) by mouth daily. 01/13/19 03/14/19  Lilia Pro., MD  naloxone [facility]) nasal spray 4 mg/0.1 mL Spray in the nares for opiate overdose as needed. If you use this you MUST call EMS. 01/13/19   Lilia Pro., MD    Inpatient Medications:  . aspirin  81 mg Oral Daily  . docusate sodium  100 mg Oral BID  . enoxaparin (LOVENOX) injection  40 mg Subcutaneous Q24H  . folic acid  1 mg Oral Daily  . LORazepam  0-4 mg Intravenous Q6H   Followed by  . [START ON 01/16/2019] LORazepam  0-4 mg Intravenous Q12H  . multivitamin with minerals  1 tablet Oral Daily  . nicotine  21 mg Transdermal Daily  . thiamine  100 mg Oral Daily   Or  . thiamine  100 mg Intravenous Daily   . sodium chloride 100 mL/hr at 01/14/19 2993    Allergies: No Known Allergies  Social History   Socioeconomic History  . Marital status: Single    Spouse name: Not on file  . Number of children: Not on file  . Years of education: Not on file  . Highest education level: Not on file  Occupational History  . Not on file  Social Needs  . Financial resource strain: Not on file  . Food insecurity    Worry: Not on file    Inability: Not on file  . Transportation needs    Medical: Not on file  Non-medical: Not on file  Tobacco Use  . Smoking status: Current Every Day Smoker    Packs/day: 1.00    Types: Cigarettes  . Smokeless tobacco: Never Used  Substance and Sexual Activity  . Alcohol use: Yes  . Drug use: Yes    Comment: heroin  . Sexual activity: Not on file  Lifestyle  . Physical activity    Days per week: Not on file    Minutes per session: Not on file  . Stress: Not on file  Relationships  . Social Herbalist on phone: Not on file    Gets together: Not on file    Attends religious service: Not on file    Active member of club or organization: Not on file    Attends meetings of clubs or organizations: Not on file    Relationship status: Not on file  . Intimate partner violence     Fear of current or ex partner: Not on file    Emotionally abused: Not on file    Physically abused: Not on file    Forced sexual activity: Not on file  Other Topics Concern  . Not on file  Social History Narrative  . Not on file     History reviewed. No pertinent family history.   Review of Systems: A 12-system review of systems was performed and is negative except as noted in the HPI.  Labs: No results for input(s): CKTOTAL, CKMB, TROPONINI in the last 72 hours. Lab Results  Component Value Date   WBC 8.7 01/13/2019   HGB 14.0 01/13/2019   HCT 40.6 01/13/2019   MCV 100.0 01/13/2019   PLT 242 01/13/2019    Recent Labs  Lab 01/13/19 2226  NA 134*  K 3.6  CL 99  CO2 23  BUN 20  CREATININE 0.74  CALCIUM 9.0  GLUCOSE 139*   No results found for: CHOL, HDL, LDLCALC, TRIG No results found for: DDIMER  Radiology/Studies:  Dg Chest Port 1 View  Result Date: 01/13/2019 CLINICAL DATA:  Tachycardia EXAM: PORTABLE CHEST 1 VIEW COMPARISON:  None. FINDINGS: there is mild cardiomegaly. Both lungs are clear. The visualized skeletal structures are unremarkable. IMPRESSION: Mild cardiomegaly. Electronically Signed   By: Prudencio Pair M.D.   On: 01/13/2019 23:16    Wt Readings from Last 3 Encounters:  01/13/19 67.1 kg  12/25/15 70.3 kg    EKG: Initial atrial fibrillation with rapid ventricular response.  Physical Exam:  Blood pressure (!) 179/94, pulse 69, temperature 98.3 F (36.8 C), temperature source Oral, resp. rate 19, height 6\' 3"  (1.905 m), weight 67.1 kg, SpO2 98 %. Body mass index is 18.5 kg/m. General: Well developed, well nourished, in no acute distress. Head: Normocephalic, atraumatic, sclera non-icteric, no xanthomas, nares are without discharge.  Neck: Negative for carotid bruits. JVD not elevated. Lungs: Clear bilaterally to auscultation without wheezes, rales, or rhonchi. Breathing is unlabored. Heart: Irregular regular rhythm Abdomen: Soft, non-tender,  non-distended with normoactive bowel sounds. No hepatomegaly. No rebound/guarding. No obvious abdominal masses. Msk:  Strength and tone appear normal for age. Extremities: No clubbing or cyanosis. No edema.  Distal pedal pulses are 2+ and equal bilaterally. Neuro: Alert and oriented X 3. No facial asymmetry. No focal deficit. Moves all extremities spontaneously. Psych:  Responds to questions appropriately with a normal affect.     Assessment and Plan  68 year old male with history of polysubstance abuse including IV heroin and alcohol who was admitted after a heroin overdose.  He was noted to be unresponsive by his friends and was brought to the emergency room.  He was noted to be in atrial fibrillation with rapid ventricular response.  Serum troponin high-sensitivity was mildly elevated at 43.  He currently complains of no chest pain.  There were no ischemic changes on his electrocardiogram.  He is hemodynamically stable.  Would continue with Cardizem following rate.  Not a candidate for anticoagulation at present given social situation.  We will follow closely for alcohol and narcotic withdrawal as patient has a strong history of this.  Will review echo when available.  Will follow heart rate.  Ivin Booty MD 01/14/2019, 8:36 AM Pager: [phone]

## 2019-01-14 NOTE — ED Notes (Signed)
Dr Alfred Levins in to check on pt

## 2019-01-14 NOTE — ED Notes (Addendum)
Attempted to call report for a second time on this pt. The RN on the floor explained that the bed were getting switched to a different bed. This RN explain that we were on the way up with the pt and that everything was in the chart and if she had any question. Charge RN made aware.

## 2019-01-14 NOTE — ED Notes (Signed)
Pt says he is having trouble lifting his left arm at the shoulder; says his arm feels weak; Dr Joan Mayans notified of same and acknowledged

## 2019-01-14 NOTE — ED Notes (Signed)
In to answer pt's call bell; accidentally pushed the button; pt a little relaxed after ativan;

## 2019-01-14 NOTE — Progress Notes (Signed)
Initial Nutrition Assessment  DOCUMENTATION CODES:   Non-severe (moderate) malnutrition in context of social or environmental circumstances, Underweight  INTERVENTION:  Monitor magnesium, potassium, and phosphorus daily for at least 3 days, MD to replete as needed, as pt is at risk for refeeding syndrome given history of polysubstance abuse.  -Ensure Enlive po BID, each supplement provides 350 kcal and 20 grams of protein (chocolate with a cup of ice)  -Magic cup TID with meals, each supplement provides 290 kcal and 9 grams of protein (chocolate)  Continue MVI with minerals daily  NUTRITION DIAGNOSIS:   Moderate Malnutrition related to social / environmental circumstances as evidenced by moderate muscle depletion, moderate fat depletion, energy intake < 75% for > or equal to 3 months.  GOAL:   Patient will meet greater than or equal to 90% of their needs  MONITOR:   PO intake, Supplement acceptance, Labs, Weight trends, I & O's, Skin  REASON FOR ASSESSMENT:   Malnutrition Screening Tool    ASSESSMENT:  68 year old male with history of polysubstance abuse including IV heroin and EtOH who presented to ED s/p friends of patient finding him unresponsive following a drug overdose   Patient finishing his second container of chocolate Magic Cup and reports feeling pretty good this morning at RD visit. Patient endorses good appetite and intake, eating 100% of meals at this time. Prior to admission, patient stated that he had been going to a church food bank once a month for food assistance and recalls 2 meals/day and snacks (assorted cereals; pancakes for breakfast, occasional sandwich or chips for lunch, vegetable soup; chicken; beans for dinner) Patient recalled drinking Ensure a few years ago when he reports taking care of his mother. He stated that she would have them delivered by the case and he would drink 1-2 every day. Patient agreeable to receiving chocolate Ensure during  admission, and requested chocolate Magic Cup with every meal.  Patient reports struggling with depression, recently having a tough time s/p losing his brother. Patient stated that he had not used heroin in a long time, but could not get over his loss of his brother and "bought a bag from someone he did not know and did it all instead of sampling it like he should have" Patient reports that he wishes to get help and "was too old to let this habit still have him"   RD unable to complete full physical exam; Patient reported that he needed to use the bedside urinal. RD observed moderate/severe fat and muscle wasting to upper body during interview. Given noted fat/muscle depletions as well as patient reported dietary recall meeting less than 75% of needs, patient meets criteria for moderate malnutrition in the context of social/environmental circumstances. Providing patient ONS to assist with calorie and protein needs.   Current wt 63.2 kg (147.6 lb) Limited wt history for review; 70.3 kg (154.7 lb) in September 2017  Medications reviewed and include: colace, ativan, MVI, thiamine, folic acid  Labs: Mg/K/Phos - WNL 10/04 Na 134 (L)  NUTRITION - FOCUSED PHYSICAL EXAM:    Most Recent Value  Orbital Region  Moderate depletion  Upper Arm Region  Moderate depletion  Thoracic and Lumbar Region  Unable to assess  Buccal Region  Severe depletion  Temple Region  Moderate depletion  Clavicle Bone Region  Severe depletion  Clavicle and Acromion Bone Region  Moderate depletion  Scapular Bone Region  Unable to assess  Dorsal Hand  Moderate depletion  Patellar Region  Unable  to assess  Anterior Thigh Region  Unable to assess  Posterior Calf Region  Unable to assess  Edema (RD Assessment)  None  Hair  Reviewed  Eyes  Reviewed  Mouth  Reviewed  Skin  Reviewed [ecchymosis,  bilateral,  forearms]  Nails  Reviewed       Diet Order:   Diet Order            Diet Heart Room service appropriate? Yes;  Fluid consistency: Thin  Diet effective now              EDUCATION NEEDS:   No education needs have been identified at this time  Skin:  Skin Assessment: Reviewed RN Assessment  Last BM:  PTA  Height:   Ht Readings from Last 1 Encounters:  01/14/19 6\' 3"  (1.905 m)    Weight:   Wt Readings from Last 1 Encounters:  01/14/19 63.2 kg    Ideal Body Weight:  89.1 kg  BMI:  Body mass index is 17.42 kg/m.  Estimated Nutritional Needs:   Kcal:  1900-2100  Protein:  95-105  Fluid:  >/= 1.9 L/day   Lajuan Lines, RD, LDN Clinical Nutrition  [phone] After Hours/Weekend Pager: [phone]

## 2019-01-14 NOTE — ED Notes (Signed)
In to let pt know a bed has been assigned but he will have to stay in the ED until after 7am; pt verbalized understanding; call bell in reach; side rails up

## 2019-01-14 NOTE — Progress Notes (Signed)
Pt arrived to room 244.  Oriented to room and safety measures.  Bed low, brakes locked, call bell within reach.

## 2019-01-14 NOTE — Progress Notes (Signed)
*  PRELIMINARY RESULTS* Echocardiogram 2D Echocardiogram has been performed.  Sherrie Sport 01/14/2019, 2:36 PM

## 2019-01-14 NOTE — ED Notes (Signed)
EKG Atrial Flutter rate 102

## 2019-01-14 NOTE — ED Notes (Signed)
EKG Atrial Flutter rate 69

## 2019-01-14 NOTE — ED Notes (Signed)
Pt says he usually drinks at least a 40 oz beer, sometimes 2, every other day or say; uses heroin "maybe twice in a week, sometimes 3"; pt admits to also doing a "bump" of cocaine last night with a friend;

## 2019-01-15 ENCOUNTER — Other Ambulatory Visit: Payer: Self-pay | Admitting: Internal Medicine

## 2019-01-15 DIAGNOSIS — R778 Other specified abnormalities of plasma proteins: Secondary | ICD-10-CM | POA: Diagnosis not present

## 2019-01-15 DIAGNOSIS — I4892 Unspecified atrial flutter: Secondary | ICD-10-CM | POA: Diagnosis not present

## 2019-01-15 DIAGNOSIS — I10 Essential (primary) hypertension: Secondary | ICD-10-CM | POA: Diagnosis not present

## 2019-01-15 DIAGNOSIS — E440 Moderate protein-calorie malnutrition: Secondary | ICD-10-CM | POA: Insufficient documentation

## 2019-01-15 LAB — CBC
HCT: 32.9 % — ABNORMAL LOW (ref 39.0–52.0)
Hemoglobin: 11.2 g/dL — ABNORMAL LOW (ref 13.0–17.0)
MCH: 34 pg (ref 26.0–34.0)
MCHC: 34 g/dL (ref 30.0–36.0)
MCV: 100 fL (ref 80.0–100.0)
Platelets: 163 10*3/uL (ref 150–400)
RBC: 3.29 MIL/uL — ABNORMAL LOW (ref 4.22–5.81)
RDW: 13 % (ref 11.5–15.5)
WBC: 6.2 10*3/uL (ref 4.0–10.5)
nRBC: 0 % (ref 0.0–0.2)

## 2019-01-15 LAB — BASIC METABOLIC PANEL
Anion gap: 5 (ref 5–15)
BUN: 12 mg/dL (ref 8–23)
CO2: 28 mmol/L (ref 22–32)
Calcium: 8.6 mg/dL — ABNORMAL LOW (ref 8.9–10.3)
Chloride: 100 mmol/L (ref 98–111)
Creatinine, Ser: 0.64 mg/dL (ref 0.61–1.24)
GFR calc Af Amer: >60 mL/min (ref >60)
GFR calc non Af Amer: >60 mL/min (ref >60)
Glucose, Bld: 109 mg/dL — ABNORMAL HIGH (ref 70–99)
Potassium: 3.7 mmol/L (ref 3.5–5.1)
Sodium: 133 mmol/L — ABNORMAL LOW (ref 135–145)

## 2019-01-15 MED ORDER — DILTIAZEM HCL ER COATED BEADS 180 MG PO CP24: 180.0000 mg | ORAL_CAPSULE | Freq: Every day | ORAL | 0 refills | Status: DC

## 2019-01-15 NOTE — TOC Transition Note (Signed)
Transition of Care [facility]) - CM/SW Discharge Note   Patient Details  Name: DENSIL OTTEY MRN: [id number] Date of Birth: [birth year]  [facility] CM/SW Contact:  Elza Rafter, RN Phone Number: 01/15/2019, 11:33 AM   Clinical Narrative:  Substance abuse resources given to patient.  Offered to make PCP appointment as he does not have a PCP.  He states he can't do that till he gets home and finds out who the doctor is-encouraged him to make appointment asap.  No further needs identified.       Final next level of care: Home/Self Care Barriers to Discharge: Continued Medical Work up   Patient Goals and CMS Choice        Discharge Placement                       Discharge Plan and Services   Discharge Planning Services: CM Consult                                 Social Determinants of Health (SDOH) Interventions     Readmission Risk Interventions No flowsheet data found.

## 2019-01-15 NOTE — Progress Notes (Signed)
Patient Name: Scott Jordan Date of Encounter: 01/15/2019  Hospital Problem List     Active Problems:   Elevated troponin   Malnutrition of moderate degree    Patient Profile     Atrial fibrillation rate has been controlled and appears to release currently being in sinus rhythm.  Hemodynamically stable.  Chads score is 1 for age greater than 30.  Subjective   Feels better.  Anxious to go home.  No chest pain or shortness of breath.  Inpatient Medications    . aspirin  81 mg Oral Daily  . diltiazem  180 mg Oral Daily  . docusate sodium  100 mg Oral BID  . enoxaparin (LOVENOX) injection  40 mg Subcutaneous Q24H  . feeding supplement (ENSURE ENLIVE)  237 mL Oral BID BM  . folic acid  1 mg Oral Daily  . influenza vaccine adjuvanted  0.5 mL Intramuscular Tomorrow-1000  . LORazepam  0-4 mg Intravenous Q6H   Followed by  . [START ON 01/16/2019] LORazepam  0-4 mg Intravenous Q12H  . multivitamin with minerals  1 tablet Oral Daily  . nicotine  21 mg Transdermal Daily  . pneumococcal 23 valent vaccine  0.5 mL Intramuscular Tomorrow-1000  . thiamine  100 mg Oral Daily   Or  . thiamine  100 mg Intravenous Daily    Vital Signs    Vitals:   01/14/19 2014 01/14/19 2100 01/15/19 0459 01/15/19 0529  BP: 136/88  (!) 153/84   Pulse: (!) 58 (!) 58 (!) 46 63  Resp: 20  20   Temp: 98 F (36.7 C)  (!) 97.5 F (36.4 C)   TempSrc: Oral  Oral   SpO2: 98%  98%   Weight:   63.4 kg   Height:        Intake/Output Summary (Last 24 hours) at 01/15/2019 0753 Last data filed at 01/15/2019 0502 Gross per 24 hour  Intake 2761.89 ml  Output 2870 ml  Net -108.11 ml   Filed Weights   01/13/19 2112 01/14/19 0834 01/15/19 0459  Weight: 67.1 kg 63.2 kg 63.4 kg    Physical Exam    GEN: Well nourished, well developed, in no acute distress.  HEENT: normal.  Neck: Supple, no JVD, carotid bruits, or masses. Cardiac: RRR, no murmurs, rubs, or gallops. No clubbing, cyanosis, edema.   Radials/DP/PT 2+ and equal bilaterally.  Respiratory:  Respirations regular and unlabored, clear to auscultation bilaterally. GI: Soft, nontender, nondistended, BS + x 4. MS: no deformity or atrophy. Skin: warm and dry, no rash. Neuro:  Strength and sensation are intact. Psych: Normal affect.  Labs    CBC Recent Labs    01/13/19 2226 01/15/19 0443  WBC 8.7 6.2  HGB 14.0 11.2*  HCT 40.6 32.9*  MCV 100.0 100.0  PLT 242 308   Basic Metabolic Panel Recent Labs    01/13/19 2226 01/14/19 0821 01/15/19 0443  NA 134*  --  133*  K 3.6  --  3.7  CL 99  --  100  CO2 23  --  28  GLUCOSE 139*  --  109*  BUN 20  --  12  CREATININE 0.74  --  0.64  CALCIUM 9.0  --  8.6*  MG  --  2.1  --   PHOS  --  3.1  --    Liver Function Tests No results for input(s): AST, ALT, ALKPHOS, BILITOT, PROT, ALBUMIN in the last 72 hours. No results for input(s): LIPASE, AMYLASE in the  last 72 hours. Cardiac Enzymes No results for input(s): CKTOTAL, CKMB, CKMBINDEX, TROPONINI in the last 72 hours. BNP No results for input(s): BNP in the last 72 hours. D-Dimer No results for input(s): DDIMER in the last 72 hours. Hemoglobin A1C No results for input(s): HGBA1C in the last 72 hours. Fasting Lipid Panel No results for input(s): CHOL, HDL, LDLCALC, TRIG, CHOLHDL, LDLDIRECT in the last 72 hours. Thyroid Function Tests Recent Labs    01/14/19 0821  TSH 2.285    Telemetry    Sinus rhythm  ECG    Atrial fibrillation with rapid ventricular response  Radiology    Dg Chest Port 1 View  Result Date: 01/13/2019 CLINICAL DATA:  Tachycardia EXAM: PORTABLE CHEST 1 VIEW COMPARISON:  None. FINDINGS: there is mild cardiomegaly. Both lungs are clear. The visualized skeletal structures are unremarkable. IMPRESSION: Mild cardiomegaly. Electronically Signed   By: Prudencio Pair M.D.   On: 01/13/2019 23:16    Assessment & Plan    Atrial fibrillation-likely secondary to polysubstance use.  Appears to have  converted back to sinus rhythm.  Chads score is 1 for age greater than 59.  Relative high risk for anticoagulation anyway.  Would continue with Tiazac 180 mg daily for rate control.  Would defer anticoagulation for now.  Continue with enteric-coated aspirin for antiplatelet therapy.  Echo showed preserved LV function with left atrial enlargement.  Ejection fraction around 50%.  No evidence of heart failure.  No further cardiac evaluation required at present. December discharge on current regimen.  We will be happy to follow as an outpatient if desired.  Polysubstance abuse-counseled at importance of cessation.  Signed, Javier Docker  MD 01/15/2019, 7:53 AM  Pager: [phone]

## 2019-01-15 NOTE — Plan of Care (Signed)
Discharge instructions provided to pt.  All questions addressed.  Understanding verified through teach back.  Awaiting transportation home via POV.   Problem: Malnutrition  (NI-5.2) Goal: Food and/or nutrient delivery Description: Individualized approach for food/nutrient provision. Outcome: Completed/Met   Problem: Education: Goal: Knowledge of General Education information will improve Description: Including pain rating scale, medication(s)/side effects and non-pharmacologic comfort measures Outcome: Completed/Met   Problem: Health Behavior/Discharge Planning: Goal: Ability to manage health-related needs will improve Outcome: Completed/Met   Problem: Clinical Measurements: Goal: Ability to maintain clinical measurements within normal limits will improve Outcome: Completed/Met Goal: Will remain free from infection Outcome: Completed/Met Goal: Diagnostic test results will improve Outcome: Completed/Met Goal: Respiratory complications will improve Outcome: Completed/Met Goal: Cardiovascular complication will be avoided Outcome: Completed/Met   Problem: Activity: Goal: Risk for activity intolerance will decrease Outcome: Completed/Met   Problem: Nutrition: Goal: Adequate nutrition will be maintained Outcome: Completed/Met   Problem: Coping: Goal: Level of anxiety will decrease Outcome: Completed/Met   Problem: Elimination: Goal: Will not experience complications related to bowel motility Outcome: Completed/Met Goal: Will not experience complications related to urinary retention Outcome: Completed/Met   Problem: Pain Managment: Goal: General experience of comfort will improve Outcome: Completed/Met   Problem: Safety: Goal: Ability to remain free from injury will improve Outcome: Completed/Met   Problem: Skin Integrity: Goal: Risk for impaired skin integrity will decrease Outcome: Completed/Met

## 2019-01-15 NOTE — Plan of Care (Signed)
  Problem: Education: Goal: Knowledge of General Education information will improve Description: Including pain rating scale, medication(s)/side effects and non-pharmacologic comfort measures Outcome: Progressing   Problem: Clinical Measurements: Goal: Respiratory complications will improve Outcome: Progressing Note: On room air   Problem: Activity: Goal: Risk for activity intolerance will decrease Outcome: Progressing   Problem: Nutrition: Goal: Adequate nutrition will be maintained Outcome: Progressing   Problem: Coping: Goal: Level of anxiety will decrease Outcome: Progressing   Problem: Elimination: Goal: Will not experience complications related to urinary retention Outcome: Progressing   Problem: Pain Managment: Goal: General experience of comfort will improve Outcome: Progressing Note: Complained of a headache once, treated with tylenol   Problem: Safety: Goal: Ability to remain free from injury will improve Outcome: Progressing   Problem: Skin Integrity: Goal: Risk for impaired skin integrity will decrease Outcome: Progressing

## 2019-01-15 NOTE — Discharge Summary (Signed)
[facility] NAME: Scott Jordan    MR#:  [id number]  DATE OF BIRTH:  10-03-1950  DATE OF ADMISSION:  01/13/2019   ADMITTING PHYSICIAN: Harrie Foreman, MD  DATE OF DISCHARGE: 01/15/19  PRIMARY CARE PHYSICIAN: Patient, No Pcp Per   ADMISSION DIAGNOSIS:  Demand ischemia ([place]) [I24.8] Accidental overdose of heroin, initial encounter ([place]) [T40.1X1A] New onset atrial flutter ([place]) [I48.92] DISCHARGE DIAGNOSIS:  Active Problems:   Elevated troponin   Malnutrition of moderate degree  SECONDARY DIAGNOSIS:   Past Medical History:  Diagnosis Date  . Substance abuse [facility]    HOSPITAL COURSE:   Scott Jordan is a 68 year old male who presented to the ED with unresponsiveness in the setting of a heroin overdose.  He was given Narcan 2 mg IV by EMS and then subsequently went into atrial flutter with rapid ventricular response on arrival to the ED.  He was given a dose of IV diltiazem.  He was admitted for further management.  New onset A. flutter with RVR- precipitated by narcan and polysubstance abuse/withdrawal. -Received a dose of IV diltiazem and was then transitioned to po dilitazem 146m daily -Started on aspirin 81 mg daily -Cardiology did not recommend any anticoagulation at this time due to a CHADSVASc score of 1 -Patient will follow-up with cardiology as an outpatient  New onset congestive heart failure with mid-range ejection fraction -ECHO with EF 45-50% -Follow-up with cardiology  Elevated troponin-likely due to demand ischemia in the setting of A. flutter with RVR.  No active chest pain. -Troponin 30 > 43  Hypertension- patient had some elevated blood pressures this admission. Opiate and alcohol withdrawal likely contributing. -Started on diltiazem 1860mdaily -Needs close BP follow-up as an outpatient  Polysubstance abuse with heroin, alcohol, and tobacco -Substance cessation counseling performed by me this admission   Moderate malnutrition- likely due to drug and alcohol abuse -Seen by dietitian this admission  DISCHARGE CONDITIONS:  New onset atrial flutter New onset congestive heart failure with midrange ejection fraction Hypertension Polysubstance abuse heroin, alcohol, and tobacco CONSULTS OBTAINED:  Treatment Team:  FaTeodoro SprayMD DRUG ALLERGIES:  No Known Allergies DISCHARGE MEDICATIONS:   Allergies as of 01/15/2019   No Known Allergies     Medication List    TAKE these medications   aspirin 81 MG chewable tablet Chew 81 mg by mouth daily. What changed: Another medication with the same name was added. Make sure you understand how and when to take each.   aspirin EC 81 MG tablet Take 1 tablet (81 mg total) by mouth daily. What changed: You were already taking a medication with the same name, and this prescription was added. Make sure you understand how and when to take each.   diltiazem 180 MG 24 hr capsule Commonly known as: CARDIZEM CD Take 1 capsule (180 mg total) by mouth daily. Start taking on: January 16, 2019   naloxone 4 MG/0.1ML Liqd nasal spray kit Commonly known as: NARCAN Spray in the nares for opiate overdose as needed. If you use this you MUST call EMS.        DISCHARGE INSTRUCTIONS:  1.  Follow-up with PCP in 5 days 2.  Follow-up with cardiology in 1-2 weeks 3.  Take diltiazem 180 mg p.o. daily 4.  Take aspirin 81 mg daily DIET:  Cardiac diet DISCHARGE CONDITION:  Stable ACTIVITY:  Activity as tolerated OXYGEN:  Home Oxygen: No.  Oxygen Delivery: room air DISCHARGE LOCATION:  home   If you experience worsening of your admission symptoms, develop shortness of breath, life threatening emergency, suicidal or homicidal thoughts you must seek medical attention immediately by calling 911 or calling your MD immediately  if symptoms less severe.  You Must read complete instructions/literature along with all the possible adverse reactions/side effects  for all the Medicines you take and that have been prescribed to you. Take any new Medicines after you have completely understood and accpet all the possible adverse reactions/side effects.   Please note  You were cared for by a hospitalist during your hospital stay. If you have any questions about your discharge medications or the care you received while you were in the hospital after you are discharged, you can call the unit and asked to speak with the hospitalist on call if the hospitalist that took care of you is not available. Once you are discharged, your primary care physician will handle any further medical issues. Please note that NO REFILLS for any discharge medications will be authorized once you are discharged, as it is imperative that you return to your primary care physician (or establish a relationship with a primary care physician if you do not have one) for your aftercare needs so that they can reassess your need for medications and monitor your lab values.    On the day of Discharge:  VITAL SIGNS:  Blood pressure (!) 154/107, pulse (!) 46, temperature 97.8 F (36.6 C), temperature source Oral, resp. rate 19, height _0  (1.905 m), weight 63.4 kg, SpO2 98 %. PHYSICAL EXAMINATION:  GENERAL:  68 y.o.-year-old patient lying in the bed with no acute distress.  Thin appearing. EYES: Pupils equal, round, reactive to light and accommodation. No scleral icterus. Extraocular muscles intact.  HEENT: Head atraumatic, normocephalic. Oropharynx and nasopharynx clear.  NECK:  Supple, no jugular venous distention. No thyroid enlargement, no tenderness.  LUNGS: Normal breath sounds bilaterally, no wheezing, rales,rhonchi or crepitation. No use of accessory muscles of respiration.  CARDIOVASCULAR: Irregularly irregular rhythm, regular rate, S1, S2 normal. No murmurs, rubs, or gallops.  ABDOMEN: Soft, non-tender, non-distended. Bowel sounds present. No organomegaly or mass.  EXTREMITIES: No pedal  edema, cyanosis, or clubbing.  NEUROLOGIC: Cranial nerves II through XII are intact. Muscle strength 5/5 in all extremities. Sensation intact. Gait not checked.  PSYCHIATRIC: The patient is alert and oriented x 3.  SKIN: No obvious rash, lesion, or ulcer.  DATA REVIEW:   CBC Recent Labs  Lab 01/15/19 0443  WBC 6.2  HGB 11.2*  HCT 32.9*  PLT 163    Chemistries  Recent Labs  Lab 01/14/19 0821 01/15/19 0443  NA  --  133*  K  --  3.7  CL  --  100  CO2  --  28  GLUCOSE  --  109*  BUN  --  12  CREATININE  --  0.64  CALCIUM  --  8.6*  MG 2.1  --      Microbiology Results  Results for orders placed or performed during the hospital encounter of 01/13/19  SARS CORONAVIRUS 2 (TAT 6-24 HRS) Nasopharyngeal Nasopharyngeal Swab     Status: None   Collection Time: 01/14/19  5:58 AM   Specimen: Nasopharyngeal Swab  Result Value Ref Range Status   SARS Coronavirus 2 NEGATIVE NEGATIVE Final    Comment: (NOTE) SARS-CoV-2 target nucleic acids are NOT DETECTED. The SARS-CoV-2 RNA is generally detectable in upper and lower respiratory specimens during the acute phase of infection. Negative results  do not preclude SARS-CoV-2 infection, do not rule out co-infections with other pathogens, and should not be used as the sole basis for treatment or other patient management decisions. Negative results must be combined with clinical observations, patient history, and epidemiological information. The expected result is Negative. Fact Sheet for Patients: SugarRoll.be Fact Sheet for Healthcare Providers: https://www.woods-mathews.com/ This test is not yet approved or cleared by the [place] FDA and  has been authorized for detection and/or diagnosis of SARS-CoV-2 by FDA under an Emergency Use Authorization (EUA). This EUA will remain  in effect (meaning this test can be used) for the duration of the COVID-19 declaration under Section 56 4(b)(1) of  the Act, 21 U.S.C. section 360bbb-3(b)(1), unless the authorization is terminated or revoked sooner. Performed at [facility], [place] [address] [place], [place] [address]     RADIOLOGY:  No results found.   Management plans discussed with the patient, family and they are in agreement.  CODE STATUS: Full Code   TOTAL TIME TAKING CARE OF THIS PATIENT: 40 minutes.    Berna Spare Kyliyah Stirn M.D on 01/15/2019 at 12:42 PM  Between 7am to 6pm - Pager - [phone]  After 6pm go to [email or web address] - Proofreader  [facility]  Office  [phone]  CC: Primary care physician; Patient, No Pcp Per   Note: This dictation was prepared with Dragon dictation along with smaller phrase technology. Any transcriptional errors that result from this process are unintentional.

## 2019-04-12 DIAGNOSIS — C801 Malignant (primary) neoplasm, unspecified: Secondary | ICD-10-CM

## 2019-04-12 HISTORY — DX: Malignant (primary) neoplasm, unspecified: C80.1

## 2019-06-18 DIAGNOSIS — I10 Essential (primary) hypertension: Secondary | ICD-10-CM | POA: Diagnosis not present

## 2019-06-18 DIAGNOSIS — I483 Typical atrial flutter: Secondary | ICD-10-CM | POA: Diagnosis not present

## 2019-06-18 DIAGNOSIS — R778 Other specified abnormalities of plasma proteins: Secondary | ICD-10-CM | POA: Diagnosis not present

## 2019-06-18 DIAGNOSIS — I4891 Unspecified atrial fibrillation: Secondary | ICD-10-CM | POA: Diagnosis not present

## 2019-06-18 DIAGNOSIS — F1911 Other psychoactive substance abuse, in remission: Secondary | ICD-10-CM | POA: Insufficient documentation

## 2019-07-02 DIAGNOSIS — I483 Typical atrial flutter: Secondary | ICD-10-CM | POA: Diagnosis not present

## 2019-07-02 DIAGNOSIS — I10 Essential (primary) hypertension: Secondary | ICD-10-CM | POA: Diagnosis not present

## 2019-07-02 DIAGNOSIS — E440 Moderate protein-calorie malnutrition: Secondary | ICD-10-CM | POA: Diagnosis not present

## 2019-07-21 DIAGNOSIS — I517 Cardiomegaly: Secondary | ICD-10-CM | POA: Diagnosis not present

## 2019-07-21 DIAGNOSIS — E440 Moderate protein-calorie malnutrition: Secondary | ICD-10-CM | POA: Diagnosis not present

## 2019-07-21 DIAGNOSIS — J189 Pneumonia, unspecified organism: Secondary | ICD-10-CM | POA: Diagnosis not present

## 2019-07-21 DIAGNOSIS — I5022 Chronic systolic (congestive) heart failure: Secondary | ICD-10-CM | POA: Diagnosis not present

## 2019-07-21 DIAGNOSIS — I5020 Unspecified systolic (congestive) heart failure: Secondary | ICD-10-CM | POA: Diagnosis not present

## 2019-07-21 DIAGNOSIS — F1010 Alcohol abuse, uncomplicated: Secondary | ICD-10-CM | POA: Insufficient documentation

## 2019-07-21 DIAGNOSIS — I483 Typical atrial flutter: Secondary | ICD-10-CM | POA: Diagnosis not present

## 2019-07-21 DIAGNOSIS — R946 Abnormal results of thyroid function studies: Secondary | ICD-10-CM | POA: Diagnosis not present

## 2019-07-21 DIAGNOSIS — Z20822 Contact with and (suspected) exposure to covid-19: Secondary | ICD-10-CM | POA: Diagnosis not present

## 2019-07-21 DIAGNOSIS — I4892 Unspecified atrial flutter: Secondary | ICD-10-CM | POA: Diagnosis not present

## 2019-07-21 DIAGNOSIS — Z7901 Long term (current) use of anticoagulants: Secondary | ICD-10-CM | POA: Diagnosis not present

## 2019-07-21 DIAGNOSIS — R42 Dizziness and giddiness: Secondary | ICD-10-CM | POA: Diagnosis not present

## 2019-07-21 DIAGNOSIS — J90 Pleural effusion, not elsewhere classified: Secondary | ICD-10-CM | POA: Diagnosis not present

## 2019-07-21 DIAGNOSIS — R0602 Shortness of breath: Secondary | ICD-10-CM | POA: Diagnosis not present

## 2019-07-21 DIAGNOSIS — E871 Hypo-osmolality and hyponatremia: Secondary | ICD-10-CM | POA: Diagnosis not present

## 2019-07-21 DIAGNOSIS — I248 Other forms of acute ischemic heart disease: Secondary | ICD-10-CM | POA: Diagnosis not present

## 2019-07-21 DIAGNOSIS — I110 Hypertensive heart disease with heart failure: Secondary | ICD-10-CM | POA: Diagnosis not present

## 2019-07-29 ENCOUNTER — Other Ambulatory Visit: Payer: Self-pay | Admitting: Specialist

## 2019-07-29 DIAGNOSIS — R05 Cough: Secondary | ICD-10-CM | POA: Diagnosis not present

## 2019-07-29 DIAGNOSIS — R0600 Dyspnea, unspecified: Secondary | ICD-10-CM | POA: Diagnosis not present

## 2019-07-29 DIAGNOSIS — J90 Pleural effusion, not elsewhere classified: Secondary | ICD-10-CM | POA: Diagnosis not present

## 2019-07-29 DIAGNOSIS — F17218 Nicotine dependence, cigarettes, with other nicotine-induced disorders: Secondary | ICD-10-CM | POA: Diagnosis not present

## 2019-07-30 ENCOUNTER — Other Ambulatory Visit: Payer: Self-pay | Admitting: Specialist

## 2019-07-30 DIAGNOSIS — J90 Pleural effusion, not elsewhere classified: Secondary | ICD-10-CM

## 2019-07-31 ENCOUNTER — Ambulatory Visit: Payer: Medicare Other

## 2019-08-02 ENCOUNTER — Ambulatory Visit
Admission: RE | Admit: 2019-08-02 | Discharge: 2019-08-02 | Disposition: A | Discharge status: Home / Self Care | Payer: Medicare Other | Source: Ambulatory Visit | Attending: Specialist | Admitting: Specialist

## 2019-08-02 ENCOUNTER — Ambulatory Visit
Admission: RE | Admit: 2019-08-02 | Discharge: 2019-08-02 | Disposition: A | Discharge status: Home / Self Care | Payer: Medicare Other | Source: Ambulatory Visit | Attending: Radiology | Admitting: Radiology

## 2019-08-02 ENCOUNTER — Other Ambulatory Visit
Admission: RE | Admit: 2019-08-02 | Discharge: 2019-08-02 | Disposition: A | Discharge status: Home / Self Care | Payer: Medicare Other | Source: Ambulatory Visit | Attending: Specialist | Admitting: Specialist

## 2019-08-02 ENCOUNTER — Other Ambulatory Visit: Payer: Self-pay

## 2019-08-02 DIAGNOSIS — J90 Pleural effusion, not elsewhere classified: Secondary | ICD-10-CM | POA: Insufficient documentation

## 2019-08-02 DIAGNOSIS — Z20822 Contact with and (suspected) exposure to covid-19: Secondary | ICD-10-CM | POA: Insufficient documentation

## 2019-08-02 DIAGNOSIS — Z9889 Other specified postprocedural states: Secondary | ICD-10-CM

## 2019-08-02 LAB — GLUCOSE, PLEURAL OR PERITONEAL FLUID: Glucose, Fluid: 136 mg/dL

## 2019-08-02 LAB — RESPIRATORY PANEL BY RT PCR (FLU A&B, COVID)
Influenza A by PCR: NEGATIVE
Influenza B by PCR: NEGATIVE
SARS Coronavirus 2 by RT PCR: NEGATIVE

## 2019-08-02 LAB — LACTATE DEHYDROGENASE, PLEURAL OR PERITONEAL FLUID: LD, Fluid: 198 U/L — ABNORMAL HIGH (ref 3–23)

## 2019-08-02 LAB — PROTEIN, PLEURAL OR PERITONEAL FLUID: Total protein, fluid: 4.5 g/dL

## 2019-08-02 LAB — AMYLASE, PLEURAL OR PERITONEAL FLUID: Amylase, Fluid: 27 U/L

## 2019-08-02 LAB — BODY FLUID CELL COUNT WITH DIFFERENTIAL
Eos, Fluid: 0 %
Lymphs, Fluid: 75 %
Monocyte-Macrophage-Serous Fluid: 20 %
Neutrophil Count, Fluid: 5 %
Total Nucleated Cell Count, Fluid: 2106 cu mm

## 2019-08-02 NOTE — Procedures (Signed)
Ultrasound-guided diagnostic and therapeutic left thoracentesis performed yielding 900 cc of blood-tinged fluid. No immediate complications. Follow-up chest x-ray pending. The fluid was sent to the lab for preordered studies. EBL none.

## 2019-08-04 LAB — ACID FAST SMEAR (AFB, MYCOBACTERIA): Acid Fast Smear: NEGATIVE

## 2019-08-05 LAB — BODY FLUID CULTURE
Culture: NO GROWTH
Gram Stain: NONE SEEN

## 2019-08-06 LAB — CYTOLOGY - NON PAP

## 2019-08-07 LAB — CHOLESTEROL, BODY FLUID: Cholesterol, Fluid: 88 mg/dL

## 2019-08-09 ENCOUNTER — Ambulatory Visit: Payer: Medicare Other | Attending: Specialist

## 2019-08-17 ENCOUNTER — Encounter: Payer: Self-pay | Admitting: Emergency Medicine

## 2019-08-17 ENCOUNTER — Emergency Department: Payer: Medicare Other

## 2019-08-17 ENCOUNTER — Other Ambulatory Visit: Payer: Self-pay

## 2019-08-17 ENCOUNTER — Inpatient Hospital Stay
Admission: EM | Admit: 2019-08-17 | Discharge: 2019-08-23 | DRG: 180 | Disposition: A | Discharge status: Home / Self Care | Payer: Medicare Other | Attending: Hospitalist | Admitting: Hospitalist

## 2019-08-17 DIAGNOSIS — C779 Secondary and unspecified malignant neoplasm of lymph node, unspecified: Secondary | ICD-10-CM | POA: Diagnosis not present

## 2019-08-17 DIAGNOSIS — C3432 Malignant neoplasm of lower lobe, left bronchus or lung: Secondary | ICD-10-CM | POA: Diagnosis not present

## 2019-08-17 DIAGNOSIS — Z743 Need for continuous supervision: Secondary | ICD-10-CM | POA: Diagnosis not present

## 2019-08-17 DIAGNOSIS — F1190 Opioid use, unspecified, uncomplicated: Secondary | ICD-10-CM | POA: Diagnosis present

## 2019-08-17 DIAGNOSIS — E860 Dehydration: Secondary | ICD-10-CM | POA: Diagnosis present

## 2019-08-17 DIAGNOSIS — Z8249 Family history of ischemic heart disease and other diseases of the circulatory system: Secondary | ICD-10-CM

## 2019-08-17 DIAGNOSIS — J189 Pneumonia, unspecified organism: Secondary | ICD-10-CM | POA: Diagnosis not present

## 2019-08-17 DIAGNOSIS — J432 Centrilobular emphysema: Secondary | ICD-10-CM | POA: Diagnosis not present

## 2019-08-17 DIAGNOSIS — R0602 Shortness of breath: Secondary | ICD-10-CM | POA: Diagnosis not present

## 2019-08-17 DIAGNOSIS — R0603 Acute respiratory distress: Secondary | ICD-10-CM | POA: Diagnosis not present

## 2019-08-17 DIAGNOSIS — Z7689 Persons encountering health services in other specified circumstances: Secondary | ICD-10-CM | POA: Diagnosis not present

## 2019-08-17 DIAGNOSIS — J910 Malignant pleural effusion: Secondary | ICD-10-CM | POA: Diagnosis present

## 2019-08-17 DIAGNOSIS — Z800 Family history of malignant neoplasm of digestive organs: Secondary | ICD-10-CM | POA: Diagnosis not present

## 2019-08-17 DIAGNOSIS — I4819 Other persistent atrial fibrillation: Secondary | ICD-10-CM | POA: Diagnosis not present

## 2019-08-17 DIAGNOSIS — E871 Hypo-osmolality and hyponatremia: Secondary | ICD-10-CM

## 2019-08-17 DIAGNOSIS — C801 Malignant (primary) neoplasm, unspecified: Secondary | ICD-10-CM | POA: Diagnosis not present

## 2019-08-17 DIAGNOSIS — I10 Essential (primary) hypertension: Secondary | ICD-10-CM | POA: Diagnosis not present

## 2019-08-17 DIAGNOSIS — Z681 Body mass index (BMI) 19 or less, adult: Secondary | ICD-10-CM

## 2019-08-17 DIAGNOSIS — E43 Unspecified severe protein-calorie malnutrition: Secondary | ICD-10-CM | POA: Diagnosis not present

## 2019-08-17 DIAGNOSIS — Z4682 Encounter for fitting and adjustment of non-vascular catheter: Secondary | ICD-10-CM | POA: Diagnosis not present

## 2019-08-17 DIAGNOSIS — J441 Chronic obstructive pulmonary disease with (acute) exacerbation: Secondary | ICD-10-CM | POA: Diagnosis not present

## 2019-08-17 DIAGNOSIS — I4891 Unspecified atrial fibrillation: Secondary | ICD-10-CM | POA: Diagnosis not present

## 2019-08-17 DIAGNOSIS — F17210 Nicotine dependence, cigarettes, uncomplicated: Secondary | ICD-10-CM | POA: Diagnosis present

## 2019-08-17 DIAGNOSIS — Z20822 Contact with and (suspected) exposure to covid-19: Secondary | ICD-10-CM | POA: Diagnosis present

## 2019-08-17 DIAGNOSIS — J9383 Other pneumothorax: Secondary | ICD-10-CM | POA: Diagnosis not present

## 2019-08-17 DIAGNOSIS — Z801 Family history of malignant neoplasm of trachea, bronchus and lung: Secondary | ICD-10-CM | POA: Diagnosis not present

## 2019-08-17 DIAGNOSIS — R918 Other nonspecific abnormal finding of lung field: Secondary | ICD-10-CM | POA: Diagnosis not present

## 2019-08-17 DIAGNOSIS — R52 Pain, unspecified: Secondary | ICD-10-CM | POA: Diagnosis not present

## 2019-08-17 DIAGNOSIS — E875 Hyperkalemia: Secondary | ICD-10-CM | POA: Diagnosis not present

## 2019-08-17 DIAGNOSIS — J90 Pleural effusion, not elsewhere classified: Secondary | ICD-10-CM | POA: Diagnosis not present

## 2019-08-17 DIAGNOSIS — R0600 Dyspnea, unspecified: Secondary | ICD-10-CM

## 2019-08-17 DIAGNOSIS — E878 Other disorders of electrolyte and fluid balance, not elsewhere classified: Secondary | ICD-10-CM | POA: Diagnosis present

## 2019-08-17 DIAGNOSIS — E222 Syndrome of inappropriate secretion of antidiuretic hormone: Secondary | ICD-10-CM | POA: Diagnosis not present

## 2019-08-17 DIAGNOSIS — I483 Typical atrial flutter: Secondary | ICD-10-CM | POA: Diagnosis present

## 2019-08-17 DIAGNOSIS — Z7982 Long term (current) use of aspirin: Secondary | ICD-10-CM

## 2019-08-17 DIAGNOSIS — J439 Emphysema, unspecified: Secondary | ICD-10-CM | POA: Diagnosis not present

## 2019-08-17 DIAGNOSIS — J188 Other pneumonia, unspecified organism: Secondary | ICD-10-CM | POA: Diagnosis not present

## 2019-08-17 LAB — POC SARS CORONAVIRUS 2 AG: SARS Coronavirus 2 Ag: NEGATIVE

## 2019-08-17 LAB — CBC
HCT: 41.3 % (ref 39.0–52.0)
Hemoglobin: 15.1 g/dL (ref 13.0–17.0)
MCH: 34.7 pg — ABNORMAL HIGH (ref 26.0–34.0)
MCHC: 36.6 g/dL — ABNORMAL HIGH (ref 30.0–36.0)
MCV: 94.9 fL (ref 80.0–100.0)
Platelets: 197 10*3/uL (ref 150–400)
RBC: 4.35 MIL/uL (ref 4.22–5.81)
RDW: 11.9 % (ref 11.5–15.5)
WBC: 9.4 10*3/uL (ref 4.0–10.5)
nRBC: 0 % (ref 0.0–0.2)

## 2019-08-17 LAB — TROPONIN I (HIGH SENSITIVITY): Troponin I (High Sensitivity): 19 ng/L — ABNORMAL HIGH (ref <18)

## 2019-08-17 LAB — COMPREHENSIVE METABOLIC PANEL
ALT: 21 U/L (ref 0–44)
AST: 33 U/L (ref 15–41)
Albumin: 3.8 g/dL (ref 3.5–5.0)
Alkaline Phosphatase: 87 U/L (ref 38–126)
Anion gap: 11 (ref 5–15)
BUN: 7 mg/dL — ABNORMAL LOW (ref 8–23)
CO2: 25 mmol/L (ref 22–32)
Calcium: 8.9 mg/dL (ref 8.9–10.3)
Chloride: 84 mmol/L — ABNORMAL LOW (ref 98–111)
Creatinine, Ser: 0.53 mg/dL — ABNORMAL LOW (ref 0.61–1.24)
GFR calc Af Amer: >60 mL/min (ref >60)
GFR calc non Af Amer: >60 mL/min (ref >60)
Glucose, Bld: 127 mg/dL — ABNORMAL HIGH (ref 70–99)
Potassium: 4.5 mmol/L (ref 3.5–5.1)
Sodium: 120 mmol/L — ABNORMAL LOW (ref 135–145)
Total Bilirubin: 0.8 mg/dL (ref 0.3–1.2)
Total Protein: 7.9 g/dL (ref 6.5–8.1)

## 2019-08-17 LAB — TSH: TSH: 0.858 u[IU]/mL (ref 0.350–4.500)

## 2019-08-17 LAB — BRAIN NATRIURETIC PEPTIDE: B Natriuretic Peptide: 103 pg/mL — ABNORMAL HIGH (ref 0.0–100.0)

## 2019-08-17 MED ORDER — IOHEXOL 300 MG/ML  SOLN
75.0000 mL | Freq: Once | INTRAMUSCULAR | Status: AC | PRN
  Administered 2019-08-17: 75 mL via INTRAVENOUS

## 2019-08-17 MED ORDER — GUAIFENESIN ER 600 MG PO TB12
600.0000 mg | ORAL_TABLET | Freq: Two times a day (BID) | ORAL | Status: DC
  Administered 2019-08-17 – 2019-08-23 (×11): 600 mg via ORAL
  Filled 2019-08-17 (×12): qty 1

## 2019-08-17 MED ORDER — MAGNESIUM HYDROXIDE 400 MG/5ML PO SUSP: 30.0000 mL | Freq: Every day | ORAL | Status: DC | PRN

## 2019-08-17 MED ORDER — SODIUM CHLORIDE 0.9 % IV SOLN
500.0000 mg | INTRAVENOUS | Status: AC
  Administered 2019-08-18 – 2019-08-21 (×5): 500 mg via INTRAVENOUS
  Filled 2019-08-17 (×7): qty 500

## 2019-08-17 MED ORDER — SODIUM CHLORIDE 0.9 % IV SOLN
2.0000 g | INTRAVENOUS | Status: AC
  Administered 2019-08-17 – 2019-08-21 (×5): 2 g via INTRAVENOUS
  Filled 2019-08-17: qty 20
  Filled 2019-08-17 (×3): qty 2
  Filled 2019-08-17 (×2): qty 20
  Filled 2019-08-17: qty 2
  Filled 2019-08-17: qty 20

## 2019-08-17 MED ORDER — SODIUM CHLORIDE 0.9 % IV SOLN: Freq: Once | INTRAVENOUS | Status: AC

## 2019-08-17 MED ORDER — HYDROCOD POLST-CPM POLST ER 10-8 MG/5ML PO SUER: 5.0000 mL | Freq: Two times a day (BID) | ORAL | Status: DC | PRN

## 2019-08-17 MED ORDER — ONDANSETRON HCL 4 MG PO TABS: 4.0000 mg | ORAL_TABLET | Freq: Four times a day (QID) | ORAL | Status: DC | PRN

## 2019-08-17 MED ORDER — TRAZODONE HCL 50 MG PO TABS
25.0000 mg | ORAL_TABLET | Freq: Every evening | ORAL | Status: DC | PRN
  Administered 2019-08-20 – 2019-08-22 (×3): 25 mg via ORAL
  Filled 2019-08-17 (×3): qty 1

## 2019-08-17 MED ORDER — DILTIAZEM HCL ER COATED BEADS 120 MG PO CP24
240.0000 mg | ORAL_CAPSULE | Freq: Every day | ORAL | Status: DC
  Administered 2019-08-18 – 2019-08-23 (×6): 240 mg via ORAL
  Filled 2019-08-17: qty 1
  Filled 2019-08-17: qty 2
  Filled 2019-08-17 (×3): qty 1
  Filled 2019-08-17 (×2): qty 2
  Filled 2019-08-17: qty 1

## 2019-08-17 MED ORDER — APIXABAN 5 MG PO TABS
5.0000 mg | ORAL_TABLET | Freq: Two times a day (BID) | ORAL | Status: DC
  Administered 2019-08-17: 5 mg via ORAL
  Filled 2019-08-17: qty 1

## 2019-08-17 MED ORDER — ENOXAPARIN SODIUM 40 MG/0.4ML ~~LOC~~ SOLN: 40.0000 mg | SUBCUTANEOUS | Status: DC

## 2019-08-17 MED ORDER — IPRATROPIUM-ALBUTEROL 0.5-2.5 (3) MG/3ML IN SOLN: 3.0000 mL | Freq: Four times a day (QID) | RESPIRATORY_TRACT | Status: DC

## 2019-08-17 MED ORDER — IPRATROPIUM-ALBUTEROL 0.5-2.5 (3) MG/3ML IN SOLN: 3.0000 mL | RESPIRATORY_TRACT | Status: DC | PRN

## 2019-08-17 MED ORDER — SODIUM CHLORIDE 0.9 % IV SOLN: INTRAVENOUS | Status: DC

## 2019-08-17 MED ORDER — ACETAMINOPHEN 325 MG PO TABS: 650.0000 mg | ORAL_TABLET | Freq: Four times a day (QID) | ORAL | Status: DC | PRN

## 2019-08-17 MED ORDER — ONDANSETRON HCL 4 MG/2ML IJ SOLN
4.0000 mg | Freq: Four times a day (QID) | INTRAMUSCULAR | Status: DC | PRN
  Administered 2019-08-18: 4 mg via INTRAVENOUS
  Filled 2019-08-17: qty 2

## 2019-08-17 MED ORDER — LOSARTAN POTASSIUM 25 MG PO TABS
25.0000 mg | ORAL_TABLET | Freq: Every day | ORAL | Status: DC
  Administered 2019-08-18 – 2019-08-23 (×6): 25 mg via ORAL
  Filled 2019-08-17 (×6): qty 1

## 2019-08-17 MED ORDER — ACETAMINOPHEN 650 MG RE SUPP: 650.0000 mg | Freq: Four times a day (QID) | RECTAL | Status: DC | PRN

## 2019-08-17 NOTE — H&P (Addendum)
[facility] NAME: Scott Jordan    MR#:  [id number]  DATE OF BIRTH:  January 30, 1951  DATE OF ADMISSION:  08/17/2019  PRIMARY CARE PHYSICIAN: Patient, No Pcp Per   REQUESTING/REFERRING PHYSICIAN: Harvest Dark, MD  CHIEF COMPLAINT:   Chief Complaint  Patient presents with  . Shortness of Breath    HISTORY OF PRESENT ILLNESS:  Scott Jordan  is a 69 y.o. male with a known history of ongoing tobacco abuse, atrial fibrillation and substance abuse, who presented to the emergency room with acute onset of worsening dyspnea lately with associated dry cough and mild wheezing.  About a month ago he had thoracentesis of left pleural effusion and 2 weeks ago he had it again for recurrence. No reported fever or chills.  He has lost more than 20 pounds over the last 6 months.  No nausea or vomiting or abdominal pain.  No headache or dizziness or blurred vision.  No dysuria, oliguria or hematuria or flank pain.  Upon presentation to the emergency room, heart rate was 105 with otherwise normal vital signs.  Labs revealed hyponatremia 120 and hypochloremia of 84 and BNP was about 103.  High-sensitivity troponin I was 19 and later 21 CBC was unremarkable.  COVID-19 antigen came back negative.  EKG showed atrial flutter/fibrillation with a rate of 89 with low voltage QRS. Chest x-ray showed progressive left basilar consolidation and effusion.  Chest CTA showed the following: 1. 6.3 cm x 3.4 cm x 4.1 cm heterogeneous left lower lobe lung mass, worrisome for malignancy. 2. Moderate to marked severity left lower lobe consolidation. 3. Large left pleural effusion. 4. Enlarged, necrotic para-aortic and aortocaval lymph nodes within the chest and abdomen, consistent with metastatic disease.  The patient was given hydration with IV normal saline.  He will be admitted to a medical monitored bed for further evaluation and management. PAST MEDICAL HISTORY:   Past Medical History:   Diagnosis Date  . A-fib ([place]) 01/10/2019   pt st this was dx by Dr. Ubaldo Glassing  . Substance abuse ([place])     PAST SURGICAL HISTORY:   Past Surgical History:  Procedure Laterality Date  . BACK SURGERY      SOCIAL HISTORY:   Social History   Tobacco Use  . Smoking status: Current Every Day Smoker    Packs/day: 0.50    Types: Cigarettes  . Smokeless tobacco: Never Used  . Tobacco comment: pt st he cut back in order to breath better  Substance Use Topics  . Alcohol use: Yes    FAMILY HISTORY:   Positive coronary artery disease in his brother.  Both parents and his brother had cancer.  DRUG ALLERGIES:  No Known Allergies  REVIEW OF SYSTEMS:   ROS As per history of present illness. All pertinent systems were reviewed above. Constitutional,  HEENT, cardiovascular, respiratory, GI, GU, musculoskeletal, neuro, psychiatric, endocrine,  integumentary and hematologic systems were reviewed and are otherwise  negative/unremarkable except for positive findings mentioned above in the HPI.   MEDICATIONS AT HOME:   Prior to Admission medications   Medication Sig Start Date End Date Taking? Authorizing Provider  diltiazem (CARDIZEM CD) 240 MG 24 hr capsule Take 240 mg by mouth daily. 07/24/19  Yes [provider]  ELIQUIS 5 MG TABS tablet Take 5 mg by mouth 2 (two) times daily. 08/01/19  Yes [provider]  losartan (COZAAR) 25 MG tablet Take 25 mg by mouth daily. 07/24/19  Yes  [provider]      VITAL SIGNS:  Pulse (!) 105, temperature (!) 97.4 F (36.3 C), temperature source Oral, resp. rate 16, height 6\' 2"  (1.88 m), weight 64.4 kg, SpO2 96 %.  PHYSICAL EXAMINATION:  Physical Exam  GENERAL:  69 y.o.-year-old Caucasian male patient lying in the bed with mild respiratory distress with conversational dyspnea. EYES: Pupils equal, round, reactive to light and accommodation. No scleral icterus. Extraocular muscles intact.  HEENT: Head atraumatic,  normocephalic. Oropharynx and nasopharynx clear.  NECK:  Supple, no jugular venous distention. No thyroid enlargement, no tenderness.  LUNGS: Diminished left basal and midlung zone breath sounds.   CARDIOVASCULAR: Regular rate and rhythm, S1, S2 normal. No murmurs, rubs, or gallops.  ABDOMEN: Soft, nondistended, nontender. Bowel sounds present. No organomegaly or mass.  EXTREMITIES: No pedal edema, cyanosis, or clubbing.  NEUROLOGIC: Cranial nerves II through XII are intact. Muscle strength 5/5 in all extremities. Sensation intact. Gait not checked.  PSYCHIATRIC: The patient is alert and oriented x 3.  Normal affect and good eye contact. SKIN: No obvious rash, lesion, or ulcer.   LABORATORY PANEL:   CBC Recent Labs  Lab 08/17/19 1921  WBC 9.4  HGB 15.1  HCT 41.3  PLT 197   ------------------------------------------------------------------------------------------------------------------  Chemistries  Recent Labs  Lab 08/17/19 1921  NA 120*  K 4.5  CL 84*  CO2 25  GLUCOSE 127*  BUN 7*  CREATININE 0.53*  CALCIUM 8.9  AST 33  ALT 21  ALKPHOS 87  BILITOT 0.8   ------------------------------------------------------------------------------------------------------------------  Cardiac Enzymes No results for input(s): TROPONINI in the last 168 hours. ------------------------------------------------------------------------------------------------------------------  RADIOLOGY:  DG Chest 2 View  Result Date: 08/17/2019 CLINICAL DATA:  Short of breath, recent diagnosis of pneumonia, recent thoracentesis EXAM: CHEST - 2 VIEW COMPARISON:  08/02/2019 FINDINGS: Frontal and lateral views of the chest demonstrate increased left pleural effusion and left basilar consolidation. Right chest is clear. No pneumothorax. Cardiac silhouette is obscured by the consolidation and effusion at the left lung base. No acute bony abnormalities. IMPRESSION: 1. Progressive left basilar consolidation and  effusion. Electronically Signed   By: Randa Ngo M.D.   On: 08/17/2019 19:46   CT Chest W Contrast  Result Date: 08/17/2019 CLINICAL DATA:  Shortness of breath. EXAM: CT CHEST WITH CONTRAST TECHNIQUE: Multidetector CT imaging of the chest was performed during intravenous contrast administration. CONTRAST:  78mL OMNIPAQUE IOHEXOL 300 MG/ML  SOLN COMPARISON:  June 11, 2004 FINDINGS: Cardiovascular: There is mild calcification of the aortic arch. The pulmonary arteries are limited in evaluation secondary to suboptimal opacification with intravenous contrast. No intraluminal filling defects are identified. Normal heart size. No pericardial effusion. Marked severity coronary artery calcification is seen Mediastinum/Nodes: Multiple subcentimeter pretracheal lymph nodes are seen. Lungs/Pleura: There is mild emphysematous lung disease. Moderate to marked severity consolidation is seen throughout the left lower lobe. A 6.3 cm x 3.4 cm x 4.1 cm heterogeneous low-attenuation soft tissue mass is seen within the posteromedial aspect of the left lung base (axial CT image 144, CT series number 2/coronal re-formatted image 97, CT series number 5). There is a large left pleural effusion. No pneumothorax is identified. Upper Abdomen: Enlarged, necrotic lymph nodes are seen within the para-aortic and aortocaval regions. A 3.9 cm x 2.9 cm and 2.2 cm x 1.8 cm heterogeneous low-attenuation soft tissue masses are seen in between the medial aspect of the inferior vena cava and distal aspect of the descending thoracic aorta (axial CT images 137 through 135,  CT series number 2). A 1.9 cm x 1.6 cm cystic appearing areas seen within the anterior aspect of the mid right kidney. Musculoskeletal: No chest wall abnormality. No acute or significant osseous findings. IMPRESSION: 1. 6.3 cm x 3.4 cm x 4.1 cm heterogeneous left lower lobe lung mass, worrisome for malignancy. 2. Moderate to marked severity left lower lobe consolidation. 3. Large  left pleural effusion. 4. Enlarged, necrotic para-aortic and aortocaval lymph nodes within the chest and abdomen, consistent with metastatic disease. Aortic Atherosclerosis (ICD10-I70.0). Electronically Signed   By: Virgina Norfolk M.D.   On: 08/17/2019 21:14      IMPRESSION AND PLAN:   1.  Recurrent left large pleural effusion likely malignant, with left lung mass concerning for malignancy especially given thoracic and abdominal necrotic lymphadenopathy concerning for metastatic disease. -The patient will be admitted to a progressive unit bed. -Interventional radiology consultation will be obtained for left guided thoracentesis in a.m. -Pulmonary consultation will be obtained. -I notified Dr. Patsey Berthold about the patient.  She is covering Dr. Raul Del with [facility] over the weekend.  2.  Left basal pneumonia likely postobstructive. -We will place the patient IV Rocephin and Zithromax. -Mucolytic therapy will be provided. -Duo nebs on a scheduled him as needed basis will be utilized.  3.  Hyponatremia and hypochloremia. -This could be related to paraneoplastic syndrome. -We will obtain hyponatremia work-up and place the patient on hydration with IV normal saline.  3.  Chronic atrial fibrillation. -We will continue Cardizem CD and Eliquis.  4.  Hypertension. -We will continue Cozaar.  5.  Tobacco abuse. -The patient was counseled for smoking cessation and will receive further counseling here.  6.  DVT prophylaxis. -Subcutaneous Lovenox.   All the records are reviewed and case discussed with ED provider. The plan of care was discussed in details with the patient (and family). I answered all questions. The patient agreed to proceed with the above mentioned plan. Further management will depend upon hospital course.   CODE STATUS: Full code  Status is: Inpatient  Remains inpatient appropriate because:Persistent severe electrolyte disturbances, Ongoing diagnostic testing  needed not appropriate for outpatient work up, Unsafe d/c plan, IV treatments appropriate due to intensity of illness or inability to take PO and Inpatient level of care appropriate due to severity of illness with suspected metastatic disease with left lung mass and left basal pneumonia requiring IV antibiotics with large pleural effusion requiring guided thoracentesis.   Dispo: The patient is from: Home              Anticipated d/c is to: Home              Anticipated d/c date is: 2 days              Patient currently is not medically stable to d/c.   TOTAL TIME TAKING CARE OF THIS PATIENT: 55 minutes.    Christel Mormon M.D on 08/17/2019 at 9:51 PM  [facility]   From 7 PM-7 AM, contact night-coverage [email or web address]  CC: Primary care physician; Patient, No Pcp Per   Note: This dictation was prepared with Dragon dictation along with smaller phrase technology. Any transcriptional errors that result from this process are unintentional.

## 2019-08-17 NOTE — ED Notes (Signed)
Pt taken to Xray.

## 2019-08-17 NOTE — ED Provider Notes (Signed)
[facility] Provider Note  Time seen: 7:48 PM  I have reviewed the triage vital signs and the nursing notes.   HISTORY  Chief Complaint Shortness of Breath   HPI Scott Jordan is a 69 y.o. male with a past medical history of atrial fibrillation, substance abuse, presents to the emergency department for shortness of breath.  Patient states approximately 2 weeks ago he had to have a pleurocentesis performed to remove fluid from the left lung.  States several weeks before that had to have it performed for the first time.  Patient states over the past for 5 days he has had progressive increase in his shortness of breath.  States significant shortness of breath with any exertion.  Patient does not wear oxygen at baseline currently satting 92 to 93% on room air, will easily desat with exertion.  Patient denies any chest pain.  Denies any fever.   Past Medical History:  Diagnosis Date  . A-fib ([place]) 01/10/2019   pt st this was dx by Dr. Ubaldo Jordan  . Substance abuse [facility])     Patient Active Problem List   Diagnosis Date Noted  . Malnutrition of moderate degree 01/15/2019  . Elevated troponin 01/14/2019    Past Surgical History:  Procedure Laterality Date  . BACK SURGERY      Prior to Admission medications   Medication Sig Start Date End Date Taking? Authorizing Provider  aspirin 81 MG chewable tablet Chew 81 mg by mouth daily.     [provider]  diltiazem (CARDIZEM CD) 180 MG 24 hr capsule Take 1 capsule (180 mg total) by mouth daily. 01/16/19   Scott, Pete Pelt, MD  naloxone [facility]) nasal spray 4 mg/0.1 mL Spray in the nares for opiate overdose as needed. If you use this you MUST call EMS. 01/13/19   Scott Jordan., MD    No Known Allergies  History reviewed. No pertinent family history.  Social History Social History   Tobacco Use  . Smoking status: Current Every Day Smoker    Packs/day: 0.50    Types: Cigarettes  .  Smokeless tobacco: Never Used  . Tobacco comment: pt st he cut back in order to breath better  Substance Use Topics  . Alcohol use: Yes  . Drug use: Yes    Comment: heroin    Review of Systems Constitutional: Negative for fever. Cardiovascular: Negative for chest pain. Respiratory: Positive for shortness of breath. Gastrointestinal: Negative for abdominal pain, vomiting and diarrhea. Musculoskeletal: Negative for musculoskeletal complaints Neurological: Negative for headache All other ROS negative  ____________________________________________   PHYSICAL EXAM:  VITAL SIGNS: ED Triage Vitals  Enc Vitals Group     BP --      Pulse Rate 08/17/19 1906 (!) 105     Resp 08/17/19 1906 16     Temp 08/17/19 1906 (!) 97.4 F (36.3 C)     Temp Source 08/17/19 1906 Oral     SpO2 08/17/19 1906 98 %     Weight 08/17/19 1916 142 lb (64.4 kg)     Height 08/17/19 1916 6\' 2"  (1.88 m)     Head Circumference --      Peak Flow --      Pain Score 08/17/19 1916 0     Pain Loc --      Pain Edu? --      Excl. in [place]? --     Constitutional: Alert and oriented. Well appearing and in no distress.  Eyes: Normal exam ENT      Head: Normocephalic and atraumatic.      Mouth/Throat: Mucous membranes are moist. Cardiovascular: Normal rate, regular rhythm around 100 bpm. Respiratory: Normal respiratory effort without tachypnea nor retractions.  Decreased left-sided breath sounds. Gastrointestinal: Soft and nontender. No distention.   Musculoskeletal: Nontender with normal range of motion in all extremities. Neurologic:  Normal speech and language. No gross focal neurologic deficits Skin:  Skin is warm, dry and intact.  Psychiatric: Mood and affect are normal.   ____________________________________________    EKG  EKG viewed and interpreted by myself appears show atrial flutter 89 bpm with a narrow QRS, normal axis, largely normal intervals with nonspecific ST  changes.  ____________________________________________    RADIOLOGY  Left-sided consolidation and effusion.  ____________________________________________   INITIAL IMPRESSION / ASSESSMENT AND PLAN / ED COURSE  Pertinent labs & imaging results that were available during my care of the patient were reviewed by me and considered in my medical decision making (see chart for details).   Patient presents to the emergency department for worsening shortness of breath over the past 4 to 5 days.  History of a left pleural effusion drained twice now.  Patient has diminished breath sounds on examination suspect pleural effusion versus pneumothorax.  Differential would also include ACS, pulmonary edema, pneumonia.  We will check labs, chest x-ray and continue to closely monitor.  No concerning findings on EKG.  Patient's chest x-ray shows left basilar consolidation and effusion.  Given the patient's chest x-ray findings we will proceed with CT imaging to further evaluate.  Patient's lab work has resulted showing significant hyponatremia at 120.  We will start on a normal saline infusion.  I spoke to the hospitalist they will be admitting to their service.  CT is pending at this time.  Scott Jordan was evaluated in Emergency Department on 08/17/2019 for the symptoms described in the history of present illness. He was evaluated in the context of the global COVID-19 pandemic, which necessitated consideration that the patient might be at risk for infection with the SARS-CoV-2 virus that causes COVID-19. Institutional protocols and algorithms that pertain to the evaluation of patients at risk for COVID-19 are in a state of rapid change based on information released by regulatory bodies including the CDC and federal and state organizations. These policies and algorithms were followed during the patient's care in the ED.  ____________________________________________   FINAL CLINICAL IMPRESSION(S) / ED  DIAGNOSES  Shortness of breath Pleural effusion Hyponatremia   Harvest Dark, MD 08/17/19 2101

## 2019-08-17 NOTE — ED Triage Notes (Signed)
Pt from home to ED via [facility] for sx of SHOB./ 2 weeks ago pt went to a [facility] related facility in [place], [place] where he was dx with pneumonia and removed 2L of fluid from the left lung. Pt st they told him his right lung was "clear". Pt st over the last 3-4 days his breathing has worsened.   EMS VS:  97.5 T 87 HR 120/84 BP 93% on 4LNC [place]- 34 Cbg: 177  Pt stating on RA here 94-96% Pt appears labored in breathing

## 2019-08-18 ENCOUNTER — Inpatient Hospital Stay: Payer: Medicare Other

## 2019-08-18 DIAGNOSIS — J90 Pleural effusion, not elsewhere classified: Secondary | ICD-10-CM

## 2019-08-18 DIAGNOSIS — E871 Hypo-osmolality and hyponatremia: Secondary | ICD-10-CM

## 2019-08-18 DIAGNOSIS — R0600 Dyspnea, unspecified: Secondary | ICD-10-CM

## 2019-08-18 DIAGNOSIS — R918 Other nonspecific abnormal finding of lung field: Secondary | ICD-10-CM

## 2019-08-18 LAB — MRSA PCR SCREENING: MRSA by PCR: NEGATIVE

## 2019-08-18 LAB — EXPECTORATED SPUTUM ASSESSMENT W GRAM STAIN, RFLX TO RESP C

## 2019-08-18 LAB — BASIC METABOLIC PANEL
Anion gap: 8 (ref 5–15)
Anion gap: 7 (ref 5–15)
Anion gap: 7 (ref 5–15)
BUN: 7 mg/dL — ABNORMAL LOW (ref 8–23)
BUN: 9 mg/dL (ref 8–23)
BUN: 12 mg/dL (ref 8–23)
CO2: 23 mmol/L (ref 22–32)
CO2: 28 mmol/L (ref 22–32)
CO2: 27 mmol/L (ref 22–32)
Calcium: 8.3 mg/dL — ABNORMAL LOW (ref 8.9–10.3)
Calcium: 8.8 mg/dL — ABNORMAL LOW (ref 8.9–10.3)
Calcium: 8 mg/dL — ABNORMAL LOW (ref 8.9–10.3)
Chloride: 91 mmol/L — ABNORMAL LOW (ref 98–111)
Chloride: 92 mmol/L — ABNORMAL LOW (ref 98–111)
Chloride: 92 mmol/L — ABNORMAL LOW (ref 98–111)
Creatinine, Ser: 0.6 mg/dL — ABNORMAL LOW (ref 0.61–1.24)
Creatinine, Ser: 0.67 mg/dL (ref 0.61–1.24)
Creatinine, Ser: 0.8 mg/dL (ref 0.61–1.24)
GFR calc Af Amer: >60 mL/min (ref >60)
GFR calc Af Amer: >60 mL/min (ref >60)
GFR calc Af Amer: >60 mL/min (ref >60)
GFR calc non Af Amer: >60 mL/min (ref >60)
GFR calc non Af Amer: >60 mL/min (ref >60)
GFR calc non Af Amer: >60 mL/min (ref >60)
Glucose, Bld: 119 mg/dL — ABNORMAL HIGH (ref 70–99)
Glucose, Bld: 112 mg/dL — ABNORMAL HIGH (ref 70–99)
Glucose, Bld: 94 mg/dL (ref 70–99)
Potassium: 5.2 mmol/L — ABNORMAL HIGH (ref 3.5–5.1)
Potassium: 5.6 mmol/L — ABNORMAL HIGH (ref 3.5–5.1)
Potassium: 4 mmol/L (ref 3.5–5.1)
Sodium: 122 mmol/L — ABNORMAL LOW (ref 135–145)
Sodium: 127 mmol/L — ABNORMAL LOW (ref 135–145)
Sodium: 126 mmol/L — ABNORMAL LOW (ref 135–145)

## 2019-08-18 LAB — CBC
HCT: 40.6 % (ref 39.0–52.0)
Hemoglobin: 14.5 g/dL (ref 13.0–17.0)
MCH: 34.6 pg — ABNORMAL HIGH (ref 26.0–34.0)
MCHC: 35.7 g/dL (ref 30.0–36.0)
MCV: 96.9 fL (ref 80.0–100.0)
Platelets: 181 10*3/uL (ref 150–400)
RBC: 4.19 MIL/uL — ABNORMAL LOW (ref 4.22–5.81)
RDW: 12 % (ref 11.5–15.5)
WBC: 11.3 10*3/uL — ABNORMAL HIGH (ref 4.0–10.5)
nRBC: 0 % (ref 0.0–0.2)

## 2019-08-18 LAB — NA AND K (SODIUM & POTASSIUM), RAND UR
Potassium Urine: 19 mmol/L
Sodium, Ur: <10 mmol/L

## 2019-08-18 LAB — STREP PNEUMONIAE URINARY ANTIGEN: Strep Pneumo Urinary Antigen: NEGATIVE

## 2019-08-18 LAB — OSMOLALITY, URINE: Osmolality, Ur: 191 mOsm/kg — ABNORMAL LOW (ref 300–900)

## 2019-08-18 LAB — OSMOLALITY: Osmolality: 258 mOsm/kg — ABNORMAL LOW (ref 275–295)

## 2019-08-18 LAB — RESPIRATORY PANEL BY RT PCR (FLU A&B, COVID)
Influenza A by PCR: NEGATIVE
Influenza B by PCR: NEGATIVE
SARS Coronavirus 2 by RT PCR: NEGATIVE

## 2019-08-18 LAB — HIV ANTIBODY (ROUTINE TESTING W REFLEX): HIV Screen 4th Generation wRfx: NONREACTIVE

## 2019-08-18 LAB — TROPONIN I (HIGH SENSITIVITY): Troponin I (High Sensitivity): 21 ng/L — ABNORMAL HIGH (ref <18)

## 2019-08-18 MED ORDER — FENTANYL CITRATE (PF) 100 MCG/2ML IJ SOLN
50.0000 ug | Freq: Once | INTRAMUSCULAR | Status: AC
  Administered 2019-08-18: 50 ug via INTRAVENOUS
  Filled 2019-08-18: qty 2

## 2019-08-18 MED ORDER — MIDAZOLAM HCL 2 MG/2ML IJ SOLN
2.0000 mg | Freq: Once | INTRAMUSCULAR | Status: AC
  Administered 2019-08-18: 2 mg via INTRAVENOUS
  Filled 2019-08-18: qty 2

## 2019-08-18 MED ORDER — TRAMADOL HCL 50 MG PO TABS
50.0000 mg | ORAL_TABLET | Freq: Four times a day (QID) | ORAL | Status: DC | PRN
  Administered 2019-08-18 – 2019-08-22 (×9): 50 mg via ORAL
  Filled 2019-08-18 (×11): qty 1

## 2019-08-18 MED ORDER — FENTANYL CITRATE (PF) 100 MCG/2ML IJ SOLN
50.0000 ug | Freq: Once | INTRAMUSCULAR | Status: AC
  Administered 2019-08-18: 50 ug via INTRAVENOUS

## 2019-08-18 MED ORDER — FENTANYL CITRATE (PF) 100 MCG/2ML IJ SOLN
50.0000 ug | INTRAMUSCULAR | Status: DC | PRN
  Administered 2019-08-18 – 2019-08-23 (×22): 50 ug via INTRAVENOUS
  Filled 2019-08-18 (×23): qty 2

## 2019-08-18 MED ORDER — THIAMINE HCL 100 MG/ML IJ SOLN
100.0000 mg | Freq: Every day | INTRAMUSCULAR | Status: DC
  Administered 2019-08-18 – 2019-08-21 (×4): 100 mg via INTRAVENOUS
  Filled 2019-08-18 (×4): qty 2

## 2019-08-18 MED ORDER — DILTIAZEM HCL 60 MG PO TABS
30.0000 mg | ORAL_TABLET | Freq: Once | ORAL | Status: AC
  Administered 2019-08-18: 30 mg via ORAL
  Filled 2019-08-18: qty 1

## 2019-08-18 MED ORDER — SODIUM ZIRCONIUM CYCLOSILICATE 5 G PO PACK
10.0000 g | PACK | Freq: Every day | ORAL | Status: DC
  Administered 2019-08-18: 10 g via ORAL
  Filled 2019-08-18 (×2): qty 2

## 2019-08-18 MED ORDER — ORAL CARE MOUTH RINSE
15.0000 mL | Freq: Two times a day (BID) | OROMUCOSAL | Status: DC
  Administered 2019-08-19 – 2019-08-22 (×6): 15 mL via OROMUCOSAL

## 2019-08-18 MED ORDER — IPRATROPIUM-ALBUTEROL 0.5-2.5 (3) MG/3ML IN SOLN
3.0000 mL | Freq: Four times a day (QID) | RESPIRATORY_TRACT | Status: DC
  Administered 2019-08-18 – 2019-08-19 (×6): 3 mL via RESPIRATORY_TRACT
  Filled 2019-08-18 (×5): qty 3

## 2019-08-18 MED ORDER — CHLORHEXIDINE GLUCONATE CLOTH 2 % EX PADS
6.0000 | MEDICATED_PAD | Freq: Every day | CUTANEOUS | Status: DC
  Administered 2019-08-18 – 2019-08-23 (×6): 6 via TOPICAL

## 2019-08-18 NOTE — Consult Note (Signed)
Reason for Consult: Left pleural effusion Referring Physician: Lorella Nimrod, MD  Scott Jordan is an 69 y.o. male.  HPI: Patient is a 69 year old current smoker (1 pack/day) who presented with a complaint of increasing shortness of breath and a recurrent left pleural effusion.  Patient has had 2 thoracenteses previously for the left pleural effusion which has recurred pretty quickly.  The patient was apparently admitted at [facility] around 21 July 2019 with the diagnosis of typical atrial flutter and pneumonia.  He underwent thoracentesis at that time primarily lymphocytic predominant exudative effusion.  Cytology was negative.  The patient continues to have issues with shortness of breath he was referred from [facility] to Dr. Wallene Huh at [facility] for further evaluation and post hospital pulmonary evaluation.  He saw Dr. Raul Del on 19 April and at that time it was evident that the effusion had recurred.  The patient was referred for a second thoracentesis went on 23 April.  Results were similar cytology was negative.  Fluid was described as bloody.  The fluid continues to be lymphocytic predominant by cell count on differential.  The patient has had a QuantiFERON gold that has been negative.  Other laboratory data has been negative.  The patient got relief of his shortness of breath after the 2 thoracentesis.  Yesterday however he presented to the emergency room at [facility] doing dyspnea.  Patient has a cough that is for the most part nonproductive and also noted some wheezing.  He has not had any fevers, chills or sweats.  He has lost over 20 pounds of weight in the last 6 months.  He has no appetite.  A chest CT was performed and showed large left pleural effusion there is also significant style adenopathy and abdominal adenopathy consistent with metastatic disease.  There is a apparent left lower lobe lung mass however most of this is drowned by effusion.  Laboratory data is significant  for marked hyponatremia with a sodium of 120.  The patient voices no other complaint.   Past Medical History:  Diagnosis Date  . A-fib ([place]) 01/10/2019   pt st this was dx by Dr. Ubaldo Glassing  . Substance abuse [facility])     Past Surgical History:  Procedure Laterality Date  . BACK SURGERY      History reviewed. No pertinent family history.  Social History:  reports that he has been smoking cigarettes. He has been smoking about 0.50 packs per day. He has never used smokeless tobacco. He reports current alcohol use. He reports current drug use.  Patient uses heroin.  Dates no recent use.  He drinks daily up to a sixpack.  No prior history of DTs.  Last drink yesterday at noon.  Works as a Youth worker, has worked in [employer] as well.  Allergies: No Known Allergies  Medications: I have reviewed the patient's current medications.  Results for orders placed or performed during the hospital encounter of 08/17/19 (from the past 48 hour(s))  CBC     Status: Abnormal   Collection Time: 08/17/19  7:21 PM  Result Value Ref Range   WBC 9.4 4.0 - 10.5 K/uL   RBC 4.35 4.22 - 5.81 MIL/uL   Hemoglobin 15.1 13.0 - 17.0 g/dL   HCT 41.3 39.0 - 52.0 %   MCV 94.9 80.0 - 100.0 fL   MCH 34.7 (H) 26.0 - 34.0 pg   MCHC 36.6 (H) 30.0 - 36.0 g/dL   RDW 11.9 11.5 - 15.5 %   Platelets  197 150 - 400 K/uL   nRBC 0.0 0.0 - 0.2 %    Comment: Performed at [facility], [address] [place], [place] [address]  Comprehensive metabolic panel     Status: Abnormal   Collection Time: 08/17/19  7:21 PM  Result Value Ref Range   Sodium 120 (L) 135 - 145 mmol/L   Potassium 4.5 3.5 - 5.1 mmol/L   Chloride 84 (L) 98 - 111 mmol/L   CO2 25 22 - 32 mmol/L   Glucose, Bld 127 (H) 70 - 99 mg/dL    Comment: Glucose reference range applies only to samples taken after fasting for at least 8 hours.   BUN 7 (L) 8 - 23 mg/dL   Creatinine, Ser 0.53 (L) 0.61 - 1.24 mg/dL   Calcium 8.9 8.9 - 10.3 mg/dL   Total  Protein 7.9 6.5 - 8.1 g/dL   Albumin 3.8 3.5 - 5.0 g/dL   AST 33 15 - 41 U/L   ALT 21 0 - 44 U/L   Alkaline Phosphatase 87 38 - 126 U/L   Total Bilirubin 0.8 0.3 - 1.2 mg/dL   GFR calc non Af Amer >60 >60 mL/min   GFR calc Af Amer >60 >60 mL/min   Anion gap 11 5 - 15    Comment: Performed at [facility], [place], [place] [address]  Troponin I (High Sensitivity)     Status: Abnormal   Collection Time: 08/17/19  7:21 PM  Result Value Ref Range   Troponin I (High Sensitivity) 19 (H) <18 ng/L    Comment: (NOTE) Elevated high sensitivity troponin I (hsTnI) values and significant  changes across serial measurements may suggest ACS but many other  chronic and acute conditions are known to elevate hsTnI results.  Refer to the "Links" section for chest pain algorithms and additional  guidance. Performed at [facility], [address] [place], [place] [address]   Brain natriuretic peptide     Status: Abnormal   Collection Time: 08/17/19  7:21 PM  Result Value Ref Range   B Natriuretic Peptide 103.0 (H) 0.0 - 100.0 pg/mL    Comment: Performed at [facility], [address] [place], [place] [address]  TSH     Status: None   Collection Time: 08/17/19  7:21 PM  Result Value Ref Range   TSH 0.858 0.350 - 4.500 uIU/mL    Comment: Performed by a 3rd Generation assay with a functional sensitivity of <=0.01 uIU/mL. Performed at [facility], [place], [place] [address]   POC SARS Coronavirus 2 Ag     Status: None   Collection Time: 08/17/19  8:35 PM  Result Value Ref Range   SARS Coronavirus 2 Ag NEGATIVE NEGATIVE    Comment: (NOTE) SARS-CoV-2 antigen NOT DETECTED.  Negative results are presumptive.  Negative results do not preclude SARS-CoV-2 infection and should not be used as the sole basis for treatment or other patient management decisions, including infection  control decisions, particularly in the presence of  clinical signs and  symptoms consistent with COVID-19, or in those who have been in contact with the virus.  Negative results must be combined with clinical observations, patient history, and epidemiological information. The expected result is Negative. Fact Sheet for Patients: PodPark.tn Fact Sheet for Healthcare Providers: GiftContent.is This test is not yet approved or cleared by the [place] FDA and  has been authorized for detection and/or diagnosis of SARS-CoV-2 by FDA  under an Emergency Use Authorization (EUA).  This EUA will remain in effect (meaning this test can be used) for the duration of  the COVID-19 de claration under Section 564(b)(1) of the Act, 21 U.S.C. section 360bbb-3(b)(1), unless the authorization is terminated or revoked sooner.   Troponin I (High Sensitivity)     Status: Abnormal   Collection Time: 08/17/19 10:59 PM  Result Value Ref Range   Troponin I (High Sensitivity) 21 (H) <18 ng/L    Comment: (NOTE) Elevated high sensitivity troponin I (hsTnI) values and significant  changes across serial measurements may suggest ACS but many other  chronic and acute conditions are known to elevate hsTnI results.  Refer to the "Links" section for chest pain algorithms and additional  guidance. Performed at [facility], [address] [place], [place] [address]   HIV Antibody (routine testing w rflx)     Status: None   Collection Time: 08/17/19 10:59 PM  Result Value Ref Range   HIV Screen 4th Generation wRfx Non Reactive Non Reactive    Comment: Performed at [facility], [place] [address], [place], [place] [address]  Culture, sputum-assessment     Status: None   Collection Time: 08/17/19 10:59 PM   Specimen: Expectorated Sputum  Result Value Ref Range   Specimen Description EXPECTORATED SPUTUM    Special Requests NONE    Sputum evaluation      Sputum specimen not acceptable for  testing.  Please recollect.   NOTIFIED Reyes Ivan 08/18/19 AT 0240 HS Performed at [facility], [address] [place], [place] [address]    Report Status 08/18/2019 FINAL   Strep pneumoniae urinary antigen     Status: None   Collection Time: 08/17/19 10:59 PM  Result Value Ref Range   Strep Pneumo Urinary Antigen NEGATIVE NEGATIVE    Comment:        Infection due to S. pneumoniae cannot be absolutely ruled out since the antigen present may be below the detection limit of the test.   Osmolality, urine     Status: Abnormal   Collection Time: 08/17/19 10:59 PM  Result Value Ref Range   Osmolality, Ur 191 (L) 300 - 900 mOsm/kg    Comment: Performed at [facility], [address] [place], [place] [address]  Osmolality     Status: Abnormal   Collection Time: 08/17/19 10:59 PM  Result Value Ref Range   Osmolality 258 (L) 275 - 295 mOsm/kg    Comment: Performed at [facility], [address] [place], [place] [address]  Na and K (sodium & potassium), rand urine     Status: None   Collection Time: 08/17/19 10:59 PM  Result Value Ref Range   Sodium, Ur <10 mmol/L   Potassium Urine 19 mmol/L    Comment: Performed at [facility], [address] [place],  [address]  Respiratory Panel by RT PCR (Flu A&B, Covid) - Nasopharyngeal Swab     Status: None   Collection Time: 08/18/19 12:06 AM   Specimen: Nasopharyngeal Swab  Result Value Ref Range   SARS Coronavirus 2 by RT PCR NEGATIVE NEGATIVE    Comment: (NOTE) SARS-CoV-2 target nucleic acids are NOT DETECTED. The SARS-CoV-2 RNA is generally detectable in upper respiratoy specimens during the acute phase of infection. The lowest concentration of SARS-CoV-2 viral copies this assay can detect is 131 copies/mL. A negative result does not preclude SARS-Cov-2 infection and should not be used as the sole basis  for treatment or other patient management decisions. A negative result may occur  with  improper specimen collection/handling, submission of specimen other than nasopharyngeal swab, presence of viral mutation(s) within the areas targeted by this assay, and inadequate number of viral copies (<131 copies/mL). A negative result must be combined with clinical observations, patient history, and epidemiological information. The expected result is Negative. Fact Sheet for Patients:  PinkCheek.be Fact Sheet for Healthcare Providers:  GravelBags.it This test is not yet ap proved or cleared by the [place] FDA and  has been authorized for detection and/or diagnosis of SARS-CoV-2 by FDA under an Emergency Use Authorization (EUA). This EUA will remain  in effect (meaning this test can be used) for the duration of the COVID-19 declaration under Section 564(b)(1) of the Act, 21 U.S.C. section 360bbb-3(b)(1), unless the authorization is terminated or revoked sooner.    Influenza A by PCR NEGATIVE NEGATIVE   Influenza B by PCR NEGATIVE NEGATIVE    Comment: (NOTE) The Xpert Xpress SARS-CoV-2/FLU/RSV assay is intended as an aid in  the diagnosis of influenza from Nasopharyngeal swab specimens and  should not be used as a sole basis for treatment. Nasal washings and  aspirates are unacceptable for Xpert Xpress SARS-CoV-2/FLU/RSV  testing. Fact Sheet for Patients: PinkCheek.be Fact Sheet for Healthcare Providers: GravelBags.it This test is not yet approved or cleared by the [place] FDA and  has been authorized for detection and/or diagnosis of SARS-CoV-2 by  FDA under an Emergency Use Authorization (EUA). This EUA will remain  in effect (meaning this test can be used) for the duration of the  Covid-19 declaration under Section 564(b)(1) of the Act, 21  U.S.C. section 360bbb-3(b)(1), unless the authorization is  terminated or revoked. Performed at [facility], [address] [place], [place] [address]   Basic metabolic panel     Status: Abnormal   Collection Time: 08/18/19  3:59 AM  Result Value Ref Range   Sodium 122 (L) 135 - 145 mmol/L   Potassium 5.2 (H) 3.5 - 5.1 mmol/L    Comment: HEMOLYSIS AT THIS LEVEL MAY AFFECT RESULT   Chloride 91 (L) 98 - 111 mmol/L   CO2 23 22 - 32 mmol/L   Glucose, Bld 119 (H) 70 - 99 mg/dL    Comment: Glucose reference range applies only to samples taken after fasting for at least 8 hours.   BUN 7 (L) 8 - 23 mg/dL   Creatinine, Ser 0.60 (L) 0.61 - 1.24 mg/dL   Calcium 8.3 (L) 8.9 - 10.3 mg/dL   GFR calc non Af Amer >60 >60 mL/min   GFR calc Af Amer >60 >60 mL/min   Anion gap 8 5 - 15    Comment: Performed at [facility], [address] [place], [place] [address]  CBC     Status: Abnormal   Collection Time: 08/18/19  3:59 AM  Result Value Ref Range   WBC 11.3 (H) 4.0 - 10.5 K/uL   RBC 4.19 (L) 4.22 - 5.81 MIL/uL   Hemoglobin 14.5 13.0 - 17.0 g/dL   HCT 40.6 39.0 - 52.0 %   MCV 96.9 80.0 - 100.0 fL   MCH 34.6 (H) 26.0 - 34.0 pg   MCHC 35.7 30.0 - 36.0 g/dL   RDW 12.0 11.5 - 15.5 %   Platelets 181 150 - 400 K/uL   nRBC 0.0 0.0 - 0.2 %    Comment: Performed at [facility], [address], [place], [place]  [address]    DG Chest 2 View  Result Date: 08/17/2019 CLINICAL DATA:  Short of breath, recent diagnosis of pneumonia, recent thoracentesis EXAM: CHEST - 2 VIEW COMPARISON:  08/02/2019 FINDINGS: Frontal and lateral views of the chest demonstrate increased left pleural effusion and left basilar consolidation. Right chest is clear. No pneumothorax. Cardiac silhouette is obscured by the consolidation and effusion at the left lung base. No acute bony abnormalities. IMPRESSION: 1. Progressive left basilar consolidation and effusion. Electronically Signed   By: Randa Ngo M.D.   On: 08/17/2019 19:46   CT Chest W Contrast  Result Date: 08/17/2019 CLINICAL DATA:   Shortness of breath. EXAM: CT CHEST WITH CONTRAST TECHNIQUE: Multidetector CT imaging of the chest was performed during intravenous contrast administration. CONTRAST:  32mL OMNIPAQUE IOHEXOL 300 MG/ML  SOLN COMPARISON:  June 11, 2004 FINDINGS: Cardiovascular: There is mild calcification of the aortic arch. The pulmonary arteries are limited in evaluation secondary to suboptimal opacification with intravenous contrast. No intraluminal filling defects are identified. Normal heart size. No pericardial effusion. Marked severity coronary artery calcification is seen Mediastinum/Nodes: Multiple subcentimeter pretracheal lymph nodes are seen. Lungs/Pleura: There is mild emphysematous lung disease. Moderate to marked severity consolidation is seen throughout the left lower lobe. A 6.3 cm x 3.4 cm x 4.1 cm heterogeneous low-attenuation soft tissue mass is seen within the posteromedial aspect of the left lung base (axial CT image 144, CT series number 2/coronal re-formatted image 97, CT series number 5). There is a large left pleural effusion. No pneumothorax is identified. Upper Abdomen: Enlarged, necrotic lymph nodes are seen within the para-aortic and aortocaval regions. A 3.9 cm x 2.9 cm and 2.2 cm x 1.8 cm heterogeneous low-attenuation soft tissue masses are seen in between the medial aspect of the inferior vena cava and distal aspect of the descending thoracic aorta (axial CT images 137 through 135, CT series number 2). A 1.9 cm x 1.6 cm cystic appearing areas seen within the anterior aspect of the mid right kidney. Musculoskeletal: No chest wall abnormality. No acute or significant osseous findings. IMPRESSION: 1. 6.3 cm x 3.4 cm x 4.1 cm heterogeneous left lower lobe lung mass, worrisome for malignancy. 2. Moderate to marked severity left lower lobe consolidation. 3. Large left pleural effusion. 4. Enlarged, necrotic para-aortic and aortocaval lymph nodes within the chest and abdomen, consistent with metastatic  disease. Aortic Atherosclerosis (ICD10-I70.0). Electronically Signed   By: Virgina Norfolk M.D.   On: 08/17/2019 21:14    Review of Systems  A 10 point review of systems was performed and it is as noted above otherwise negative.  Blood pressure (!) 152/103, pulse 76, temperature 97.6 F (36.4 C), temperature source Oral, resp. rate 20, height 6\' 2"  (1.88 m), weight 62.9 kg, SpO2 92 %. Physical Exam GENERAL: Cachectic gentleman HEAD: Normocephalic, atraumatic.  EYES: Pupils equal, round, reactive to light.  No scleral icterus.  MOUTH: Terrible dentition, dentures upper, fractured teeth lower. NECK: Supple. No thyromegaly.  Trachea midline. No JVD.  PULMONARY: Coarse breath sounds upper lung zones, diminished breath sounds left mid and lower lung zone.  Dullness to percussion left base. CARDIOVASCULAR: S1 and S2. Irregular rate and rhythm.  No rubs murmurs gallops heard. GASTROINTESTINAL: Scaphoid abdomen, nondistended, normoactive bowel sounds, no tenderness, no masses. MUSCULOSKELETAL: No joint deformity, query mild clubbing, no edema.  NEUROLOGIC: No overt focal deficit, speech is fluent. SKIN: Intact,warm,dry. PSYCH: Appears anxious sometimes impulsive at times.   Assessment/Plan:  Large recurrent left pleural effusion Lung mass  Mediastinal and abdominal adenopathy Malignancy until proven otherwise Suspect "lymphocytes" in the pleural effusion may be actually small cell cancer Hyponatremia increases suspicion for small cell cancer Recommend placement of left chest tube due to recurrent nature of the effusion Patient has been on Eliquis so will use small bore tube. 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 Derrill Kay, MD [facility] 08/18/2019, 11:42 AM    *This note was dictated using voice recognition software/Dragon.  Despite best efforts to proofread, errors can occur which can change the meaning.  Any change was purely unintentional.

## 2019-08-18 NOTE — Progress Notes (Signed)
PROGRESS NOTE    Scott Jordan  SWF:[id number] DOB: 06/19/1950 DOA: 08/17/2019 PCP: Patient, No Pcp Per   Brief Narrative:  Scott Jordan  is a 70 y.o. male with a known history of ongoing tobacco abuse, atrial fibrillation and substance abuse, who presented to the emergency room with acute onset of worsening dyspnea lately with associated dry cough and mild wheezing.  About a month ago he had thoracentesis of left pleural effusion and 2 weeks ago he had it again for recurrence. No reported fever or chills.  He has lost more than 20 pounds over the last 6 months.  Prior thoracentesis results were without any malignant cells.  CTA with a suspicious lung mass and multiple enlarged necrotic lymph nodes consistent with metastatic disease.  Hyponatremia consistent with SIADH.  Subjective: Patient continued to have dyspnea.  He is a lifetime smoker and drinks regularly.  Assessment & Plan:   Active Problems:   Malignant pleural effusion  Recurrent left large pleural effusion likely malignant, with left lung mass concerning for malignancy especially given thoracic and abdominal necrotic lymphadenopathy concerning for metastatic disease. Pulmonary was consulted-they are planning a chest tube placement and moving him to stepdown. -Patient will need biopsy for a tissue diagnosis. -Suspecting small cell carcinoma due to concurrent hyponatremia. -Continue supportive care.  Left basal pneumonia likely postobstructive. -Continue Rocephin and Zithromax. -Continue mucolytic therapy and supportive care.  Hyponatremia.  Labs are more consistent with SIADH.  Consistent with para neoplastic syndrome. -We will continue with normal saline as he also appears dehydrated-goal correction is 8-10, current sodium of 122.  If fail to respond he will need salt tablets. -Continue to monitor  Chronic atrial fibrillation.  Currently rate well controlled. -Continue home dose of Cardizem. -Eliquis is being held due to  upcoming procedures with chest tube and biopsy.  Hypertension.  Pressure mildly elevated. -Continue with Cozaar.  Alcohol use.  Patient drinks on a regular basis and appears little shaky when seen today although denies any other symptoms. -Place him on CIWA protocol-we will add Ativan if needed.  Tobacco abuse.  Consulting was provided. -Do not want a nicotine patch at this time-can be ordered if needed.  Objective: Vitals:   08/18/19 0949 08/18/19 1127 08/18/19 1200 08/18/19 1300  BP: (!) 113/99 (!) 152/103 (!) 151/89 (!) 153/99  Pulse: 76  65 67  Resp: 14 20 (!) 28 (!) 23  Temp: 98.5 F (36.9 C) 97.6 F (36.4 C)    TempSrc: Oral Oral    SpO2: 92% 92% 97% 99%  Weight:  62.9 kg    Height:  6\' 2"  (1.88 m)      Intake/Output Summary (Last 24 hours) at 08/18/2019 1353 Last data filed at 08/18/2019 1300 Gross per 24 hour  Intake 1482.11 ml  Output 300 ml  Net 1182.11 ml   Filed Weights   08/17/19 1916 08/18/19 1127  Weight: 64.4 kg 62.9 kg    Examination:  General exam: Emaciated gentleman, appears calm and comfortable  Respiratory system: Decreased breath sounds at left base, respiratory effort normal. Cardiovascular system: Irregularly irregular with normal rate. No JVD, murmurs, rubs, gallops or clicks. Gastrointestinal system: Soft, nontender, nondistended, bowel sounds positive. Central nervous system: Alert and oriented. No focal neurological deficits. Extremities: No edema, no cyanosis, pulses intact and symmetrical. Psychiatry: Judgement and insight appear normal.   DVT prophylaxis: Lovenox Code Status: Full Family Communication: Discussed with patient Disposition Plan:  Status is: Inpatient  Remains inpatient appropriate because:Inpatient level of care  appropriate due to severity of illness   Dispo: The patient is from: Home              Anticipated d/c is to: To be determined              Anticipated d/c date is: 2 days              Patient currently is  not medically stable to d/c.  Patient with metastatic lung disease, needs tissue diagnosis and the plan moving forward before discharge.  Consultants:   Pulmonary  IR  Procedures:  Antimicrobials:  Rocephin Zithromax  Data Reviewed: I have personally reviewed following labs and imaging studies  CBC: Recent Labs  Lab 08/17/19 1921 08/18/19 0359  WBC 9.4 11.3*  HGB 15.1 14.5  HCT 41.3 40.6  MCV 94.9 96.9  PLT 197 762   Basic Metabolic Panel: Recent Labs  Lab 08/17/19 1921 08/18/19 0359  NA 120* 122*  K 4.5 5.2*  CL 84* 91*  CO2 25 23  GLUCOSE 127* 119*  BUN 7* 7*  CREATININE 0.53* 0.60*  CALCIUM 8.9 8.3*   GFR: Estimated Creatinine Clearance: 79.7 mL/min (A) (by C-G formula based on SCr of 0.6 mg/dL (L)). Liver Function Tests: Recent Labs  Lab 08/17/19 1921  AST 33  ALT 21  ALKPHOS 87  BILITOT 0.8  PROT 7.9  ALBUMIN 3.8   No results for input(s): LIPASE, AMYLASE in the last 168 hours. No results for input(s): AMMONIA in the last 168 hours. Coagulation Profile: No results for input(s): INR, PROTIME in the last 168 hours. Cardiac Enzymes: No results for input(s): CKTOTAL, CKMB, CKMBINDEX, TROPONINI in the last 168 hours. BNP (last 3 results) No results for input(s): PROBNP in the last 8760 hours. HbA1C: No results for input(s): HGBA1C in the last 72 hours. CBG: No results for input(s): GLUCAP in the last 168 hours. Lipid Profile: No results for input(s): CHOL, HDL, LDLCALC, TRIG, CHOLHDL, LDLDIRECT in the last 72 hours. Thyroid Function Tests: Recent Labs    08/17/19 1921  TSH 0.858   Anemia Panel: No results for input(s): VITAMINB12, FOLATE, FERRITIN, TIBC, IRON, RETICCTPCT in the last 72 hours. Sepsis Labs: No results for input(s): PROCALCITON, LATICACIDVEN in the last 168 hours.  Recent Results (from the past 240 hour(s))  Culture, sputum-assessment     Status: None   Collection Time: 08/17/19 10:59 PM   Specimen: Expectorated Sputum   Result Value Ref Range Status   Specimen Description EXPECTORATED SPUTUM  Final   Special Requests NONE  Final   Sputum evaluation   Final    Sputum specimen not acceptable for testing.  Please recollect.   NOTIFIED Scott Jordan 08/18/19 AT 0240 HS Performed at [facility], [address] [place], [place] [address]    Report Status 08/18/2019 FINAL  Final  Respiratory Panel by RT PCR (Flu A&B, Covid) - Nasopharyngeal Swab     Status: None   Collection Time: 08/18/19 12:06 AM   Specimen: Nasopharyngeal Swab  Result Value Ref Range Status   SARS Coronavirus 2 by RT PCR NEGATIVE NEGATIVE Final    Comment: (NOTE) SARS-CoV-2 target nucleic acids are NOT DETECTED. The SARS-CoV-2 RNA is generally detectable in upper respiratoy specimens during the acute phase of infection. The lowest concentration of SARS-CoV-2 viral copies this assay can detect is 131 copies/mL. A negative result does not preclude SARS-Cov-2 infection and should not be used as the sole basis for treatment or other patient management decisions.  A negative result may occur with  improper specimen collection/handling, submission of specimen other than nasopharyngeal swab, presence of viral mutation(s) within the areas targeted by this assay, and inadequate number of viral copies (<131 copies/mL). A negative result must be combined with clinical observations, patient history, and epidemiological information. The expected result is Negative. Fact Sheet for Patients:  PinkCheek.be Fact Sheet for Healthcare Providers:  GravelBags.it This test is not yet ap proved or cleared by the [place] FDA and  has been authorized for detection and/or diagnosis of SARS-CoV-2 by FDA under an Emergency Use Authorization (EUA). This EUA will remain  in effect (meaning this test can be used) for the duration of the COVID-19 declaration under Section 564(b)(1) of the  Act, 21 U.S.C. section 360bbb-3(b)(1), unless the authorization is terminated or revoked sooner.    Influenza A by PCR NEGATIVE NEGATIVE Final   Influenza B by PCR NEGATIVE NEGATIVE Final    Comment: (NOTE) The Xpert Xpress SARS-CoV-2/FLU/RSV assay is intended as an aid in  the diagnosis of influenza from Nasopharyngeal swab specimens and  should not be used as a sole basis for treatment. Nasal washings and  aspirates are unacceptable for Xpert Xpress SARS-CoV-2/FLU/RSV  testing. Fact Sheet for Patients: PinkCheek.be Fact Sheet for Healthcare Providers: GravelBags.it This test is not yet approved or cleared by the [place] FDA and  has been authorized for detection and/or diagnosis of SARS-CoV-2 by  FDA under an Emergency Use Authorization (EUA). This EUA will remain  in effect (meaning this test can be used) for the duration of the  Covid-19 declaration under Section 564(b)(1) of the Act, 21  U.S.C. section 360bbb-3(b)(1), unless the authorization is  terminated or revoked. Performed at [facility], [address] [place], [place] [address]   MRSA PCR Screening     Status: None   Collection Time: 08/18/19 11:32 AM   Specimen: Nasopharyngeal  Result Value Ref Range Status   MRSA by PCR NEGATIVE NEGATIVE Final    Comment:        The GeneXpert MRSA Assay (FDA approved for NASAL specimens only), is one component of a comprehensive MRSA colonization surveillance program. It is not intended to diagnose MRSA infection nor to guide or monitor treatment for MRSA infections. Performed at [facility], [address] [place], [place] [address]      Radiology Studies: DG Chest 2 View  Result Date: 08/17/2019 CLINICAL DATA:  Short of breath, recent diagnosis of pneumonia, recent thoracentesis EXAM: CHEST - 2 VIEW COMPARISON:  08/02/2019 FINDINGS: Frontal and lateral views of the chest demonstrate  increased left pleural effusion and left basilar consolidation. Right chest is clear. No pneumothorax. Cardiac silhouette is obscured by the consolidation and effusion at the left lung base. No acute bony abnormalities. IMPRESSION: 1. Progressive left basilar consolidation and effusion. Electronically Signed   By: Randa Ngo M.D.   On: 08/17/2019 19:46   CT Chest W Contrast  Result Date: 08/17/2019 CLINICAL DATA:  Shortness of breath. EXAM: CT CHEST WITH CONTRAST TECHNIQUE: Multidetector CT imaging of the chest was performed during intravenous contrast administration. CONTRAST:  41mL OMNIPAQUE IOHEXOL 300 MG/ML  SOLN COMPARISON:  June 11, 2004 FINDINGS: Cardiovascular: There is mild calcification of the aortic arch. The pulmonary arteries are limited in evaluation secondary to suboptimal opacification with intravenous contrast. No intraluminal filling defects are identified. Normal heart size. No pericardial effusion. Marked severity coronary artery calcification is seen Mediastinum/Nodes: Multiple subcentimeter pretracheal lymph nodes are seen. Lungs/Pleura:  There is mild emphysematous lung disease. Moderate to marked severity consolidation is seen throughout the left lower lobe. A 6.3 cm x 3.4 cm x 4.1 cm heterogeneous low-attenuation soft tissue mass is seen within the posteromedial aspect of the left lung base (axial CT image 144, CT series number 2/coronal re-formatted image 97, CT series number 5). There is a large left pleural effusion. No pneumothorax is identified. Upper Abdomen: Enlarged, necrotic lymph nodes are seen within the para-aortic and aortocaval regions. A 3.9 cm x 2.9 cm and 2.2 cm x 1.8 cm heterogeneous low-attenuation soft tissue masses are seen in between the medial aspect of the inferior vena cava and distal aspect of the descending thoracic aorta (axial CT images 137 through 135, CT series number 2). A 1.9 cm x 1.6 cm cystic appearing areas seen within the anterior aspect of the mid  right kidney. Musculoskeletal: No chest wall abnormality. No acute or significant osseous findings. IMPRESSION: 1. 6.3 cm x 3.4 cm x 4.1 cm heterogeneous left lower lobe lung mass, worrisome for malignancy. 2. Moderate to marked severity left lower lobe consolidation. 3. Large left pleural effusion. 4. Enlarged, necrotic para-aortic and aortocaval lymph nodes within the chest and abdomen, consistent with metastatic disease. Aortic Atherosclerosis (ICD10-I70.0). Electronically Signed   By: Virgina Norfolk M.D.   On: 08/17/2019 21:14    Scheduled Meds: . Chlorhexidine Gluconate Cloth  6 each Topical Daily  . diltiazem  240 mg Oral Daily  . fentaNYL (SUBLIMAZE) injection  50 mcg Intravenous Once  . guaiFENesin  600 mg Oral BID  . losartan  25 mg Oral Daily  . mouth rinse  15 mL Mouth Rinse BID  . midazolam  2 mg Intravenous Once   Continuous Infusions: . sodium chloride 100 mL/hr at 08/18/19 1300  . azithromycin Stopped (08/18/19 0152)  . cefTRIAXone (ROCEPHIN)  IV Stopped (08/18/19 0054)     LOS: 1 day   Time spent: 40 minutes.  Lorella Nimrod, MD [facility]  If 7PM-7AM, please contact night-coverage [email or web address]  08/18/2019, 1:53 PM   This record has been created using Systems analyst. Errors have been sought and corrected,but may not always be located. Such creation errors do not reflect on the standard of care.

## 2019-08-18 NOTE — Procedures (Signed)
Chest Tube Insertion: Indication: Recurrent LEFT pleural effusion  Consent: Signed and on chart  Risks and benefits explained in detail including risk of infection, bleeding, respiratory failure and death.  High risk of bleeding due to current anticoagulation (now held).   Hand washing performed prior to starting the procedure.   Type of Anesthesia: 1 % Lidocaine.  Patient received 2 mg Versed IV and 100 mcg fentanyl IV for the procedure.  Vital signs were monitored.  Patient was in stepdown unit.  Procedure: An active timeout was performed and correct patient, name, & ID confirmed.  After explaining risks and benefits, patient positioned correctly for chest tube placement. Patient was prepped in a sterile fashion including chlorohexadine preps, sterile drape, sterile gown and sterile gloves.  Area was anesthetized with 1% lidocaine.  Introducer needle was placed and guide wire was inserted. Using Seldinger Technique, the introducer needle was removed and a dilator was used to create an opening for insertion of chest tube.  A Wayne Pneumothorax 14 French pigtail chest tube was utilized.  The tube was sutured in place and proper dressing applied.  Ultrasound was used for localizing optimum entry point.  Findings: Large pleural effusion noted with ultrasound on the left.  Serosanguineous pleural effusion over 2 L, tube clamped after 1 L and allowed to drain, another liter drained once the tube was allowed to drain again.  The fluid then overflowed the Pleur-evac which had to be replaced.  Chest tube placed bewteen 5-7 ribs midaxiilary line.  Patient tolerated the procedure well.  Chest tube connected to: pleurovac, -20 cmH2O  Number of Attempts: 1  Complications: None  Estimated Blood Loss: Nil  Chest radiograph indicated and ordered.  Chest tube in place, no pneumothorax, partial drainage of the large pleural effusion.  Significant pleural effusion remains.  Operator: Windy Canny. Derrill Kay, MD [facility]

## 2019-08-19 ENCOUNTER — Inpatient Hospital Stay: Payer: Medicare Other

## 2019-08-19 ENCOUNTER — Inpatient Hospital Stay (HOSPITAL_COMMUNITY)
Admit: 2019-08-19 | Discharge: 2019-08-19 | Disposition: A | Discharge status: Home / Self Care | Payer: Medicare Other | Attending: Pulmonary Disease | Admitting: Pulmonary Disease

## 2019-08-19 DIAGNOSIS — E43 Unspecified severe protein-calorie malnutrition: Secondary | ICD-10-CM | POA: Insufficient documentation

## 2019-08-19 DIAGNOSIS — R0602 Shortness of breath: Secondary | ICD-10-CM

## 2019-08-19 LAB — LEGIONELLA PNEUMOPHILA SEROGP 1 UR AG: L. pneumophila Serogp 1 Ur Ag: NEGATIVE

## 2019-08-19 LAB — BASIC METABOLIC PANEL
Anion gap: 6 (ref 5–15)
Anion gap: 6 (ref 5–15)
Anion gap: 5 (ref 5–15)
BUN: 11 mg/dL (ref 8–23)
BUN: 10 mg/dL (ref 8–23)
BUN: 12 mg/dL (ref 8–23)
CO2: 27 mmol/L (ref 22–32)
CO2: 27 mmol/L (ref 22–32)
CO2: 27 mmol/L (ref 22–32)
Calcium: 8 mg/dL — ABNORMAL LOW (ref 8.9–10.3)
Calcium: 7.8 mg/dL — ABNORMAL LOW (ref 8.9–10.3)
Calcium: 7.8 mg/dL — ABNORMAL LOW (ref 8.9–10.3)
Chloride: 94 mmol/L — ABNORMAL LOW (ref 98–111)
Chloride: 94 mmol/L — ABNORMAL LOW (ref 98–111)
Chloride: 95 mmol/L — ABNORMAL LOW (ref 98–111)
Creatinine, Ser: 0.63 mg/dL (ref 0.61–1.24)
Creatinine, Ser: 0.83 mg/dL (ref 0.61–1.24)
Creatinine, Ser: 0.75 mg/dL (ref 0.61–1.24)
GFR calc Af Amer: >60 mL/min (ref >60)
GFR calc Af Amer: >60 mL/min (ref >60)
GFR calc Af Amer: >60 mL/min (ref >60)
GFR calc non Af Amer: >60 mL/min (ref >60)
GFR calc non Af Amer: >60 mL/min (ref >60)
GFR calc non Af Amer: >60 mL/min (ref >60)
Glucose, Bld: 116 mg/dL — ABNORMAL HIGH (ref 70–99)
Glucose, Bld: 112 mg/dL — ABNORMAL HIGH (ref 70–99)
Glucose, Bld: 99 mg/dL (ref 70–99)
Potassium: 4.2 mmol/L (ref 3.5–5.1)
Potassium: 3.9 mmol/L (ref 3.5–5.1)
Potassium: 4 mmol/L (ref 3.5–5.1)
Sodium: 127 mmol/L — ABNORMAL LOW (ref 135–145)
Sodium: 127 mmol/L — ABNORMAL LOW (ref 135–145)
Sodium: 127 mmol/L — ABNORMAL LOW (ref 135–145)

## 2019-08-19 LAB — PHOSPHORUS: Phosphorus: 3.3 mg/dL (ref 2.5–4.6)

## 2019-08-19 LAB — CBC WITH DIFFERENTIAL/PLATELET
Abs Immature Granulocytes: 0.18 10*3/uL — ABNORMAL HIGH (ref 0.00–0.07)
Basophils Absolute: 0.1 10*3/uL (ref 0.0–0.1)
Basophils Relative: 1 %
Eosinophils Absolute: 0.1 10*3/uL (ref 0.0–0.5)
Eosinophils Relative: 1 %
HCT: 37.2 % — ABNORMAL LOW (ref 39.0–52.0)
Hemoglobin: 12.9 g/dL — ABNORMAL LOW (ref 13.0–17.0)
Immature Granulocytes: 2 %
Lymphocytes Relative: 12 %
Lymphs Abs: 1 10*3/uL (ref 0.7–4.0)
MCH: 34.5 pg — ABNORMAL HIGH (ref 26.0–34.0)
MCHC: 34.7 g/dL (ref 30.0–36.0)
MCV: 99.5 fL (ref 80.0–100.0)
Monocytes Absolute: 0.9 10*3/uL (ref 0.1–1.0)
Monocytes Relative: 11 %
Neutro Abs: 6.2 10*3/uL (ref 1.7–7.7)
Neutrophils Relative %: 73 %
Platelets: 158 10*3/uL (ref 150–400)
RBC: 3.74 MIL/uL — ABNORMAL LOW (ref 4.22–5.81)
RDW: 12.1 % (ref 11.5–15.5)
WBC: 8.4 10*3/uL (ref 4.0–10.5)
nRBC: 0 % (ref 0.0–0.2)

## 2019-08-19 LAB — CORTISOL-AM, BLOOD: Cortisol - AM: 23.5 ug/dL — ABNORMAL HIGH (ref 6.7–22.6)

## 2019-08-19 LAB — ECHOCARDIOGRAM COMPLETE
Height: 74 in
Weight: 2218.71 oz

## 2019-08-19 LAB — MAGNESIUM: Magnesium: 1.7 mg/dL (ref 1.7–2.4)

## 2019-08-19 MED ORDER — IPRATROPIUM-ALBUTEROL 0.5-2.5 (3) MG/3ML IN SOLN
3.0000 mL | Freq: Three times a day (TID) | RESPIRATORY_TRACT | Status: DC
  Administered 2019-08-20 – 2019-08-21 (×4): 3 mL via RESPIRATORY_TRACT
  Filled 2019-08-19 (×4): qty 3

## 2019-08-19 MED ORDER — ENSURE ENLIVE PO LIQD
237.0000 mL | Freq: Three times a day (TID) | ORAL | Status: DC
  Administered 2019-08-19 – 2019-08-22 (×9): 237 mL via ORAL

## 2019-08-19 NOTE — Progress Notes (Signed)
PROGRESS NOTE    Scott Jordan  LNL:[id number] DOB: 25-May-1950 DOA: 08/17/2019 PCP: Patient, No Pcp Per   Brief Narrative:  Scott Jordan  is a 69 y.o. male with a known history of ongoing tobacco abuse, atrial fibrillation and substance abuse, who presented to the emergency room with acute onset of worsening dyspnea lately with associated dry cough and mild wheezing.  About a month ago he had thoracentesis of left pleural effusion and 2 weeks ago he had it again for recurrence. No reported fever or chills.  He has lost more than 20 pounds over the last 6 months.  Prior thoracentesis results were without any malignant cells.  CTA with a suspicious lung mass and multiple enlarged necrotic lymph nodes consistent with metastatic disease.  Hyponatremia consistent with SIADH.  Subjective: Shortness feeling little better.  Complaining of some pain around chest tube area.  Assessment & Plan:   Active Problems:   Malignant pleural effusion   Hyponatremia   Dyspnea   Pleural effusion   Recurrent pleural effusion on left   Protein-calorie malnutrition, severe  Recurrent left large pleural effusion likely malignant, with left lung mass concerning for malignancy especially given thoracic and abdominal necrotic lymphadenopathy concerning for metastatic disease. Pulmonary was consulted-thoracentesis was done yesterday with placement of chest tube, more than 2 L removed. -Patient will need biopsy for a tissue diagnosis. -Suspecting small cell carcinoma due to concurrent hyponatremia. -Continue supportive care.  Left basal pneumonia likely postobstructive. -Continue Rocephin and Zithromax. -Continue mucolytic therapy and supportive care.  Hyponatremia.  Labs are more consistent with SIADH.  Consistent with para neoplastic syndrome. -We will continue with normal saline as he also appears dehydrated-goal correction is 8-10, current sodium of 127.  If fail to respond he will need salt tablets. -Continue to  monitor  Chronic atrial fibrillation.  Currently rate well controlled. -Continue home dose of Cardizem. -Eliquis is being held due to upcoming procedures with chest tube and biopsy.  Hypertension.  Pressure mildly elevated. -Continue with Cozaar.  Alcohol use.  Patient drinks on a regular basis and appears little shaky when seen today although denies any other symptoms. -Place him on CIWA protocol-we will add Ativan if needed.  Tobacco abuse.  Consulting was provided. -Do not want a nicotine patch at this time-can be ordered if needed.  Objective: Vitals:   08/19/19 0602 08/19/19 1000 08/19/19 1300 08/19/19 1400  BP: 136/74 103/74 100/75 123/89  Pulse: 97 (!) 107 70 71  Resp: (!) 30 15 13    Temp:      TempSrc:      SpO2: 98% 100% 95% 97%  Weight:      Height:        Intake/Output Summary (Last 24 hours) at 08/19/2019 1443 Last data filed at 08/19/2019 0423 Gross per 24 hour  Intake 2133.81 ml  Output 1870 ml  Net 263.81 ml   Filed Weights   08/17/19 1916 08/18/19 1127  Weight: 64.4 kg 62.9 kg    Examination:  General exam: Emaciated gentleman, appears calm and comfortable  Respiratory system: Clear bilaterally with a patent chest tube in place, respiratory effort normal. Cardiovascular system: Irregularly irregular with normal rate. No JVD, murmurs, rubs, gallops or clicks. Gastrointestinal system: Soft, nontender, nondistended, bowel sounds positive. Central nervous system: Alert and oriented. No focal neurological deficits. Extremities: No edema, no cyanosis, pulses intact and symmetrical. Psychiatry: Judgement and insight appear normal.   DVT prophylaxis: Lovenox Code Status: Full Family Communication: Discussed with patient Disposition Plan:  Status is: Inpatient  Remains inpatient appropriate because:Inpatient level of care appropriate due to severity of illness   Dispo: The patient is from: Home              Anticipated d/c is to: To be determined               Anticipated d/c date is: 2 days              Patient currently is not medically stable to d/c.  Patient with metastatic lung disease, needs tissue diagnosis and the plan moving forward before discharge.  Consultants:   Pulmonary  IR  Procedures:  Antimicrobials:  Rocephin Zithromax  Data Reviewed: I have personally reviewed following labs and imaging studies  CBC: Recent Labs  Lab 08/17/19 1921 08/18/19 0359 08/19/19 0622  WBC 9.4 11.3* 8.4  NEUTROABS  --   --  6.2  HGB 15.1 14.5 12.9*  HCT 41.3 40.6 37.2*  MCV 94.9 96.9 99.5  PLT 197 181 161   Basic Metabolic Panel: Recent Labs  Lab 08/18/19 0359 08/18/19 1433 08/18/19 2231 08/19/19 0622 08/19/19 1359  NA 122* 127* 126* 127* 127*  K 5.2* 5.6* 4.0 4.2 3.9  CL 91* 92* 92* 94* 94*  CO2 23 28 27 27 27   GLUCOSE 119* 112* 94 116* 112*  BUN 7* 9 12 11 10   CREATININE 0.60* 0.67 0.80 0.63 0.83  CALCIUM 8.3* 8.8* 8.0* 8.0* 7.8*  MG  --   --   --  1.7  --   PHOS  --   --   --  3.3  --    GFR: Estimated Creatinine Clearance: 76.8 mL/min (by C-G formula based on SCr of 0.83 mg/dL). Liver Function Tests: Recent Labs  Lab 08/17/19 1921  AST 33  ALT 21  ALKPHOS 87  BILITOT 0.8  PROT 7.9  ALBUMIN 3.8   No results for input(s): LIPASE, AMYLASE in the last 168 hours. No results for input(s): AMMONIA in the last 168 hours. Coagulation Profile: No results for input(s): INR, PROTIME in the last 168 hours. Cardiac Enzymes: No results for input(s): CKTOTAL, CKMB, CKMBINDEX, TROPONINI in the last 168 hours. BNP (last 3 results) No results for input(s): PROBNP in the last 8760 hours. HbA1C: No results for input(s): HGBA1C in the last 72 hours. CBG: No results for input(s): GLUCAP in the last 168 hours. Lipid Profile: No results for input(s): CHOL, HDL, LDLCALC, TRIG, CHOLHDL, LDLDIRECT in the last 72 hours. Thyroid Function Tests: Recent Labs    08/17/19 1921  TSH 0.858   Anemia Panel: No results for  input(s): VITAMINB12, FOLATE, FERRITIN, TIBC, IRON, RETICCTPCT in the last 72 hours. Sepsis Labs: No results for input(s): PROCALCITON, LATICACIDVEN in the last 168 hours.  Recent Results (from the past 240 hour(s))  Culture, sputum-assessment     Status: None   Collection Time: 08/17/19 10:59 PM   Specimen: Expectorated Sputum  Result Value Ref Range Status   Specimen Description EXPECTORATED SPUTUM  Final   Special Requests NONE  Final   Sputum evaluation   Final    Sputum specimen not acceptable for testing.  Please recollect.   NOTIFIED Reyes Ivan 08/18/19 AT 0240 HS Performed at [facility], [address] [place], [place] [address]    Report Status 08/18/2019 FINAL  Final  Respiratory Panel by RT PCR (Flu A&B, Covid) - Nasopharyngeal Swab     Status: None   Collection Time: 08/18/19 12:06 AM   Specimen:  Nasopharyngeal Swab  Result Value Ref Range Status   SARS Coronavirus 2 by RT PCR NEGATIVE NEGATIVE Final    Comment: (NOTE) SARS-CoV-2 target nucleic acids are NOT DETECTED. The SARS-CoV-2 RNA is generally detectable in upper respiratoy specimens during the acute phase of infection. The lowest concentration of SARS-CoV-2 viral copies this assay can detect is 131 copies/mL. A negative result does not preclude SARS-Cov-2 infection and should not be used as the sole basis for treatment or other patient management decisions. A negative result may occur with  improper specimen collection/handling, submission of specimen other than nasopharyngeal swab, presence of viral mutation(s) within the areas targeted by this assay, and inadequate number of viral copies (<131 copies/mL). A negative result must be combined with clinical observations, patient history, and epidemiological information. The expected result is Negative. Fact Sheet for Patients:  PinkCheek.be Fact Sheet for Healthcare Providers:   GravelBags.it This test is not yet ap proved or cleared by the [place] FDA and  has been authorized for detection and/or diagnosis of SARS-CoV-2 by FDA under an Emergency Use Authorization (EUA). This EUA will remain  in effect (meaning this test can be used) for the duration of the COVID-19 declaration under Section 564(b)(1) of the Act, 21 U.S.C. section 360bbb-3(b)(1), unless the authorization is terminated or revoked sooner.    Influenza A by PCR NEGATIVE NEGATIVE Final   Influenza B by PCR NEGATIVE NEGATIVE Final    Comment: (NOTE) The Xpert Xpress SARS-CoV-2/FLU/RSV assay is intended as an aid in  the diagnosis of influenza from Nasopharyngeal swab specimens and  should not be used as a sole basis for treatment. Nasal washings and  aspirates are unacceptable for Xpert Xpress SARS-CoV-2/FLU/RSV  testing. Fact Sheet for Patients: PinkCheek.be Fact Sheet for Healthcare Providers: GravelBags.it This test is not yet approved or cleared by the [place] FDA and  has been authorized for detection and/or diagnosis of SARS-CoV-2 by  FDA under an Emergency Use Authorization (EUA). This EUA will remain  in effect (meaning this test can be used) for the duration of the  Covid-19 declaration under Section 564(b)(1) of the Act, 21  U.S.C. section 360bbb-3(b)(1), unless the authorization is  terminated or revoked. Performed at [facility], [address] [place], [place] [address]   MRSA PCR Screening     Status: None   Collection Time: 08/18/19 11:32 AM   Specimen: Nasopharyngeal  Result Value Ref Range Status   MRSA by PCR NEGATIVE NEGATIVE Final    Comment:        The GeneXpert MRSA Assay (FDA approved for NASAL specimens only), is one component of a comprehensive MRSA colonization surveillance program. It is not intended to diagnose MRSA infection nor to guide or monitor  treatment for MRSA infections. Performed at [facility], [address] [place], [place] [address]      Radiology Studies: DG Chest 2 View  Result Date: 08/17/2019 CLINICAL DATA:  Short of breath, recent diagnosis of pneumonia, recent thoracentesis EXAM: CHEST - 2 VIEW COMPARISON:  08/02/2019 FINDINGS: Frontal and lateral views of the chest demonstrate increased left pleural effusion and left basilar consolidation. Right chest is clear. No pneumothorax. Cardiac silhouette is obscured by the consolidation and effusion at the left lung base. No acute bony abnormalities. IMPRESSION: 1. Progressive left basilar consolidation and effusion. Electronically Signed   By: Randa Ngo M.D.   On: 08/17/2019 19:46   CT CHEST WO CONTRAST  Result Date: 08/19/2019 CLINICAL DATA:  Inpatient. Status  post left pleural chest tube placement. Follow-up left lower lobe lung mass. EXAM: CT CHEST WITHOUT CONTRAST TECHNIQUE: Multidetector CT imaging of the chest was performed following the standard protocol without IV contrast. COMPARISON:  08/17/2019 chest CT. 08/18/2019 chest radiograph. FINDINGS: Cardiovascular: Normal heart size. No significant pericardial effusion/thickening. Three-vessel coronary atherosclerosis. Atherosclerotic nonaneurysmal thoracic aorta. Normal caliber pulmonary arteries. Mediastinum/Nodes: No discrete thyroid nodules. Unremarkable esophagus. No axillary adenopathy. Stable enlarged 1.0 cm left prevascular node (series 2/image 79) at the level of the pulmonic trunk. Stable enlarged 2.0 cm subcarinal node (series 2/image 79). Stable mild AP window adenopathy up to 1.0 cm (series 2/image 64). Stable enlarged 1.9 cm left infrahilar node (series 2/image 89). Stable enlarged 1.6 cm lower right paraesophageal node (series 2/image 128). No discrete right hilar adenopathy on these noncontrast images. Lungs/Pleura: No right pneumothorax. No right pleural effusion. Interval placement of left pleural  pigtail drain terminating in the peripheral mid to lower left pleural space. Small residual left hydropneumothorax with minimal apical pneumothorax component. Irregular nodular dependent basilar left pleural thickening, unchanged. Poorly delineated 6.7 x 4.0 cm medial basilar left lower lobe lung mass (series 2/image 121). Moderate centrilobular and paraseptal emphysema. Lingular 1.0 cm solid pulmonary nodule (series 3/image 108). Interlobular septal thickening and patchy ground-glass opacity throughout lingula and left lower lobe with residual moderate atelectasis at the left lung base. Upper abdomen: Enlarged 2.0 cm gastrohepatic ligament node (series 2/image 152). Enlarged 1.7 cm left para-aortic node (series 2/image 165). Musculoskeletal: No aggressive appearing focal osseous lesions. Mild thoracic spondylosis. IMPRESSION: 1. Interval placement of left pleural pigtail drain. Small residual left hydropneumothorax with minimal apical pneumothorax component. 2. Poorly delineated 6.7 x 4.0 cm medial basilar left lower lobe lung mass, highly suspicious for primary bronchogenic carcinoma. 3. Lingular 1.0 cm solid pulmonary nodule, suspicious for ipsilateral metastasis. 4. Interlobular septal thickening and patchy ground-glass opacity throughout the lingula and left lower lobe with residual moderate atelectasis at the left lung base. 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 5. Ipsilateral infrahilar, subcarinal, AP window, prevascular and upper retroperitoneal lymphadenopathy compatible with metastatic disease. PET-CT suggested for complete staging evaluation. 6. Three-vessel coronary atherosclerosis. 7. Aortic Atherosclerosis (ICD10-I70.0) and Emphysema (ICD10-J43.9). Electronically Signed   By: Ilona Sorrel M.D.   On: 08/19/2019 08:08   CT Chest W Contrast  Result Date: 08/17/2019 CLINICAL DATA:  Shortness of breath. EXAM: CT CHEST WITH CONTRAST TECHNIQUE: Multidetector CT imaging of the chest was  performed during intravenous contrast administration. CONTRAST:  23mL OMNIPAQUE IOHEXOL 300 MG/ML  SOLN COMPARISON:  June 11, 2004 FINDINGS: Cardiovascular: There is mild calcification of the aortic arch. The pulmonary arteries are limited in evaluation secondary to suboptimal opacification with intravenous contrast. No intraluminal filling defects are identified. Normal heart size. No pericardial effusion. Marked severity coronary artery calcification is seen Mediastinum/Nodes: Multiple subcentimeter pretracheal lymph nodes are seen. Lungs/Pleura: There is mild emphysematous lung disease. Moderate to marked severity consolidation is seen throughout the left lower lobe. A 6.3 cm x 3.4 cm x 4.1 cm heterogeneous low-attenuation soft tissue mass is seen within the posteromedial aspect of the left lung base (axial CT image 144, CT series number 2/coronal re-formatted image 97, CT series number 5). There is a large left pleural effusion. No pneumothorax is identified. Upper Abdomen: Enlarged, necrotic lymph nodes are seen within the para-aortic and aortocaval regions. A 3.9 cm x 2.9 cm and 2.2 cm x 1.8 cm heterogeneous low-attenuation soft tissue masses are seen in between the medial aspect of the inferior vena  cava and distal aspect of the descending thoracic aorta (axial CT images 137 through 135, CT series number 2). A 1.9 cm x 1.6 cm cystic appearing areas seen within the anterior aspect of the mid right kidney. Musculoskeletal: No chest wall abnormality. No acute or significant osseous findings. IMPRESSION: 1. 6.3 cm x 3.4 cm x 4.1 cm heterogeneous left lower lobe lung mass, worrisome for malignancy. 2. Moderate to marked severity left lower lobe consolidation. 3. Large left pleural effusion. 4. Enlarged, necrotic para-aortic and aortocaval lymph nodes within the chest and abdomen, consistent with metastatic disease. Aortic Atherosclerosis (ICD10-I70.0). Electronically Signed   By: Virgina Norfolk M.D.   On:  08/17/2019 21:14   DG Chest Port 1 View  Result Date: 08/18/2019 CLINICAL DATA:  Left-sided chest tube placement. EXAM: PORTABLE CHEST 1 VIEW COMPARISON:  Aug 17, 2019 FINDINGS: Since the prior study there is been interval placement of a left-sided chest tube. Its distal tip is seen overlying the lateral aspect of the mid left lung. There is a large, stable left pleural effusion. No pneumothorax is identified. The heart size and mediastinal contours are within normal limits. Degenerative changes seen within the mid to lower thoracic spine. IMPRESSION: 1. Interval placement of a left-sided chest tube. 2. Large, stable left pleural effusion. 3. No pneumothorax. Electronically Signed   By: Virgina Norfolk M.D.   On: 08/18/2019 16:36   ECHOCARDIOGRAM COMPLETE  Result Date: 08/19/2019    ECHOCARDIOGRAM REPORT   Patient Name:   ODIES DESA Date of Exam: 08/19/2019 Medical Rec #:  [id number]    Height:       74.0 in Accession #:    [id number]   Weight:       138.7 lb Date of Birth:  Jan 01, 1951     BSA:          1.860 m Patient Age:    84 years     BP:           136/74 mmHg Patient Gender: M            HR:           97 bpm. Exam Location:  [facility] Procedure: 2D Echo, Color Doppler and Cardiac Doppler Indications:     Dyspnea 786.09  History:         Patient has prior history of Echocardiogram examinations, most                  recent 01/14/2019. Arrythmias:Atrial Fibrillation.  Sonographer:     Sherrie Sport RDCS (AE) Referring Phys:  [id number] Tyler Pita Diagnosing Phys: Kathlyn Sacramento MD  Sonographer Comments: No apical window. Large bandage in apical space. IMPRESSIONS  1. Left ventricular ejection fraction, by estimation, is 50 to 55%. The left ventricle has low normal function. Left ventricular endocardial border not optimally defined to evaluate regional wall motion. There is moderate left ventricular hypertrophy. Left ventricular diastolic function could not be evaluated.  2. Right ventricular systolic function is  normal. The right ventricular size is normal. Tricuspid regurgitation signal is inadequate for assessing PA pressure.  3. The mitral valve is normal in structure. No evidence of mitral valve regurgitation. No evidence of mitral stenosis.  4. The aortic valve is normal in structure. Aortic valve regurgitation is not visualized. Mild aortic valve sclerosis is present, with no evidence of aortic valve stenosis.  5. Very limited study due to lack of apical images. FINDINGS  Left Ventricle: Left ventricular ejection fraction, by estimation,  is 50 to 55%. The left ventricle has low normal function. Left ventricular endocardial border not optimally defined to evaluate regional wall motion. The left ventricular internal cavity  size was normal in size. There is moderate left ventricular hypertrophy. Left ventricular diastolic function could not be evaluated. Right Ventricle: The right ventricular size is normal. No increase in right ventricular wall thickness. Right ventricular systolic function is normal. Tricuspid regurgitation signal is inadequate for assessing PA pressure. The tricuspid regurgitant velocity is 2.52 m/s, and with an assumed right atrial pressure of 10 mmHg, the estimated right ventricular systolic pressure is 10.1 mmHg. Left Atrium: Left atrial size was normal in size. Right Atrium: Right atrial size was normal in size. Pericardium: There is no evidence of pericardial effusion. Mitral Valve: The mitral valve is normal in structure. Normal mobility of the mitral valve leaflets. No evidence of mitral valve regurgitation. No evidence of mitral valve stenosis. Tricuspid Valve: The tricuspid valve is normal in structure. Tricuspid valve regurgitation is trivial. No evidence of tricuspid stenosis. Aortic Valve: The aortic valve is normal in structure. Aortic valve regurgitation is not visualized. Mild aortic valve sclerosis is present, with no evidence of aortic valve stenosis. Pulmonic Valve: The pulmonic  valve was normal in structure. Pulmonic valve regurgitation is not visualized. No evidence of pulmonic stenosis. Aorta: The aortic root is normal in size and structure. Venous: The inferior vena cava was not well visualized. IAS/Shunts: No atrial level shunt detected by color flow Doppler.  LEFT VENTRICLE PLAX 2D LVIDd:         3.12 cm LVIDs:         2.21 cm LV PW:         1.21 cm LV IVS:        1.38 cm LVOT diam:     2.00 cm LVOT Area:     3.14 cm  LEFT ATRIUM         Index LA diam:    3.40 cm 1.83 cm/m                        PULMONIC VALVE AORTA                 PV Vmax:        0.38 m/s Ao Root diam: 3.10 cm PV Peak grad:   0.6 mmHg                       RVOT Peak grad: 2 mmHg  TRICUSPID VALVE TR Peak grad:   25.4 mmHg TR Vmax:        252.00 cm/s  SHUNTS Systemic Diam: 2.00 cm Kathlyn Sacramento MD Electronically signed by Kathlyn Sacramento MD Signature Date/Time: 08/19/2019/12:17:53 PM    Final     Scheduled Meds: . Chlorhexidine Gluconate Cloth  6 each Topical Daily  . diltiazem  240 mg Oral Daily  . feeding supplement (ENSURE ENLIVE)  237 mL Oral TID BM  . guaiFENesin  600 mg Oral BID  . ipratropium-albuterol  3 mL Nebulization Q6H  . losartan  25 mg Oral Daily  . mouth rinse  15 mL Mouth Rinse BID  . sodium zirconium cyclosilicate  10 g Oral Daily  . thiamine injection  100 mg Intravenous Daily   Continuous Infusions: . sodium chloride 100 mL/hr at 08/19/19 1113  . azithromycin Stopped (08/19/19 0109)  . cefTRIAXone (ROCEPHIN)  IV Stopped (08/18/19 2354)     LOS: 2 days  Time spent: 40 minutes.  Lorella Nimrod, MD [facility]  If 7PM-7AM, please contact night-coverage [email or web address]  08/19/2019, 2:43 PM   This record has been created using Systems analyst. Errors have been sought and corrected,but may not always be located. Such creation errors do not reflect on the standard of care.

## 2019-08-19 NOTE — Progress Notes (Signed)
[facility] for Electrolyte Monitoring and Replacement   Recent Labs: Potassium (mmol/L)  Date Value  08/19/2019 4.2   Magnesium (mg/dL)  Date Value  08/19/2019 1.7   Calcium (mg/dL)  Date Value  08/19/2019 8.0 (L)   Albumin (g/dL)  Date Value  08/17/2019 3.8   Phosphorus (mg/dL)  Date Value  08/19/2019 3.3   Sodium (mmol/L)  Date Value  08/19/2019 127 (L)     Assessment: 69 year old male presented with SOB. Patient with recurrent left large pleural effusion with mass concerning for malignancy. Patient is malnourished and at risk for refeeding syndrome. Pharmacy consulted for electrolytes.  Goal of Therapy:  Electrolytes WNL, K ~ 4 and Mg ~ 2  Plan:  No electrolyte replacement needed today. Will follow up all electrolytes with morning labs.  Tawnya Crook ,PharmD Clinical Pharmacist 08/19/2019 1:24 PM

## 2019-08-19 NOTE — Progress Notes (Signed)
Initial Nutrition Assessment  DOCUMENTATION CODES:   Severe malnutrition in context of chronic illness, Underweight  INTERVENTION:  Provide Ensure Enlive po TID, each supplement provides 350 kcal and 20 grams of protein.  Reviewed "High-Calorie, High-Protein Nutrition Therapy" and "High-Calorie, High-Protein Recipes" handouts from the Academy of [employer] and Dietetics with patient. Encouraged patient to eat small, frequent meals throughout the day. Discussed choosing foods and beverages that are calorie- and protein-dense.  Monitor magnesium, potassium, and phosphorus daily for at least 3 days, MD to replete as needed, as pt is at risk for refeeding syndrome given severe malnutrition.  NUTRITION DIAGNOSIS:   Severe Malnutrition related to chronic illness(recurrent left large pleural effusion and left lung mass concerning for malignancy, inadequate oral intake) as evidenced by severe fat depletion, severe muscle depletion.  GOAL:   Patient will meet greater than or equal to 90% of their needs  MONITOR:   PO intake, Supplement acceptance, Labs, Weight trends, I & O's  REASON FOR ASSESSMENT:   Malnutrition Screening Tool    ASSESSMENT:   69 year old male with PMHx of A-fib, substance abuse, EtOH use admitted with recurrent left large pleural effusion likely malignant s/p placement of left chest tube on 5/9, with left lung mass concerning for malignancy, PNA, hyponatremia consistent with SIADH.   Met with patient at bedside. He reports he has had a poor PO intake for several months now. He reports anorexia (absence of hunger) and occasional nausea, abdominal pain, or bloating. He reports he often goes up to 3 days without having anything to eat. If he does eat he reports it is only one meal. He may have a frozen meal or something else easy to make. Patient reports he has been eating fairly well here. He was still eating breakfast at time of RD assessment. No meal documentation  available at this time. Patient is amenable to drinking Ensure to help meet calorie/protein needs. He reports he used to drink 2 per day at home.  Patient reports his UBW was 155 lbs (70.5 kg). According to chart the last time he weighed near this was in 2017. He was 70.3 kg on 12/25/2015. Limited weight history after that until a weight of 63.4 kg on 01/15/2019. He is now 62.9 kg (138.67 lbs).  Medications reviewed and include: Lokelma, thiamine 100 mg daily IV, NS at 100 mL/hr, azithromycin, ceftriaxone.  Labs reviewed: Sodium 127, Chloride 94. Potassium, Magnesium, and Phosphorus WNL.  NUTRITION - FOCUSED PHYSICAL EXAM:    Most Recent Value  Orbital Region  Severe depletion  Upper Arm Region  Severe depletion  Thoracic and Lumbar Region  Severe depletion  Buccal Region  Severe depletion  Temple Region  Severe depletion  Clavicle Bone Region  Severe depletion  Clavicle and Acromion Bone Region  Severe depletion  Scapular Bone Region  Severe depletion  Dorsal Hand  Severe depletion  Patellar Region  Severe depletion  Anterior Thigh Region  Severe depletion  Posterior Calf Region  Severe depletion  Edema (RD Assessment)  None  Hair  Reviewed  Eyes  Reviewed  Mouth  Reviewed  Skin  Reviewed  Nails  Reviewed     Diet Order:   Diet Order            Diet regular Room service appropriate? Yes; Fluid consistency: Thin  Diet effective now             EDUCATION NEEDS:   Education needs have been addressed  Skin:  Skin Assessment: Reviewed  RN Assessment  Last BM:  08/17/2019  Height:   Ht Readings from Last 1 Encounters:  08/18/19 6' 2"  (1.88 m)   Weight:   Wt Readings from Last 1 Encounters:  08/18/19 62.9 kg   Ideal Body Weight:  86.4 kg  BMI:  Body mass index is 17.8 kg/m.  Estimated Nutritional Needs:   Kcal:  1900-2100  Protein:  95-105 grams  Fluid:  1.9-2.1 L/day  Jacklynn Barnacle, MS, RD, LDN Pager number available on Amion

## 2019-08-19 NOTE — Progress Notes (Signed)
*  PRELIMINARY RESULTS* Echocardiogram 2D Echocardiogram has been performed.  Scott Jordan 08/19/2019, 10:06 AM

## 2019-08-20 DIAGNOSIS — J188 Other pneumonia, unspecified organism: Secondary | ICD-10-CM

## 2019-08-20 DIAGNOSIS — Z801 Family history of malignant neoplasm of trachea, bronchus and lung: Secondary | ICD-10-CM

## 2019-08-20 DIAGNOSIS — Z800 Family history of malignant neoplasm of digestive organs: Secondary | ICD-10-CM

## 2019-08-20 DIAGNOSIS — F17210 Nicotine dependence, cigarettes, uncomplicated: Secondary | ICD-10-CM

## 2019-08-20 LAB — BASIC METABOLIC PANEL
Anion gap: 6 (ref 5–15)
BUN: 9 mg/dL (ref 8–23)
CO2: 27 mmol/L (ref 22–32)
Calcium: 7.9 mg/dL — ABNORMAL LOW (ref 8.9–10.3)
Chloride: 96 mmol/L — ABNORMAL LOW (ref 98–111)
Creatinine, Ser: 0.64 mg/dL (ref 0.61–1.24)
GFR calc Af Amer: >60 mL/min (ref >60)
GFR calc non Af Amer: >60 mL/min (ref >60)
Glucose, Bld: 113 mg/dL — ABNORMAL HIGH (ref 70–99)
Potassium: 4.1 mmol/L (ref 3.5–5.1)
Sodium: 129 mmol/L — ABNORMAL LOW (ref 135–145)

## 2019-08-20 LAB — MAGNESIUM: Magnesium: 1.7 mg/dL (ref 1.7–2.4)

## 2019-08-20 LAB — PHOSPHORUS: Phosphorus: 2.6 mg/dL (ref 2.5–4.6)

## 2019-08-20 MED ORDER — MAGNESIUM SULFATE 2 GM/50ML IV SOLN
2.0000 g | Freq: Once | INTRAVENOUS | Status: AC
  Administered 2019-08-20: 2 g via INTRAVENOUS
  Filled 2019-08-20: qty 50

## 2019-08-20 NOTE — TOC Initial Note (Addendum)
Transition of Care [facility]) - Initial/Assessment Note    Patient Details  Name: Scott Jordan MRN: [id number] Date of Birth: September 16, 1950  [facility] CM/SW Contact:    Magnus Ivan, LCSW Phone Number: 08/20/2019, 11:04 AM  Clinical Narrative:                CSW received consult for medication assistance. CSW spoke with patient at bedside. Explained CSW role. Patient reported he lives alone and friends provide transportation. Patient reported he is on a limited income and would like to see about applying for Medicaid. Patient was agreeable to CSW sending patient's information to [employer] for follow up regarding Medicaid application process. Patient reported he thinks he has Medicare Part D. Patient reported he was having trouble affording Eliquis and is hoping to be switched to something more affordable, CSW will follow for medication/coupon needs prior to discharge depending on what medications patient is discharged on. Patient also asked about transportation resources for appointments when his friends are unavailable. CSW provided [facility] List. Patient reported he will reach out to them about getting this arranged. Patient denied additional needs at this time. CSW will continue to follow.   5/12: Received follow up from [employer]. Per Suanne Marker, since patient has Medicare and is not looking for SNF placement at this time, they will not help with Medicaid application process at this time. She reported patient should have friend or family assist with the Medicaid process. CSW updated RNCM Stephanie.   Expected Discharge Plan: Home/Self Care Barriers to Discharge: Continued Medical Work up   Patient Goals and CMS Choice Patient states their goals for this hospitalization and ongoing recovery are:: to return home      Expected Discharge Plan and Services Expected Discharge Plan: Home/Self Care       Living arrangements for the past 2  months: Single Family Home                                      Prior Living Arrangements/Services Living arrangements for the past 2 months: Single Family Home Lives with:: Self Patient language and need for interpreter reviewed:: Yes Do you feel safe going back to the place where you live?: Yes      Need for Family Participation in Patient Care: Yes (Comment) Care giver support system in place?: Yes (comment)   Criminal Activity/Legal Involvement Pertinent to Current Situation/Hospitalization: No - Comment as needed  Activities of Daily Living Home Assistive Devices/Equipment: Eyeglasses ADL Screening (condition at time of admission) Patient's cognitive ability adequate to safely complete daily activities?: Yes Is the patient deaf or have difficulty hearing?: No Does the patient have difficulty seeing, even when wearing glasses/contacts?: No Does the patient have difficulty concentrating, remembering, or making decisions?: No Patient able to express need for assistance with ADLs?: Yes Does the patient have difficulty dressing or bathing?: No Independently performs ADLs?: Yes (appropriate for developmental age) Does the patient have difficulty walking or climbing stairs?: No Weakness of Legs: None Weakness of Arms/Hands: None  Permission Sought/Granted   Permission granted to share information with : Yes, Verbal Permission Granted     Permission granted to share info w AGENCY: [employer] [facility]        Emotional Assessment Appearance:: Appears stated age Attitude/Demeanor/Rapport: Engaged Affect (typically observed): Calm Orientation: : Oriented to Self, Oriented to Place, Oriented to  Time, Oriented  to Situation   Psych Involvement: No (comment)  Admission diagnosis:  Hyponatremia [E87.1] Pleural effusion [J90] Malignant pleural effusion [J91.0] Recurrent pleural effusion on left [J90] Dyspnea, unspecified type [R06.00] Patient Active Problem List    Diagnosis Date Noted  . Protein-calorie malnutrition, severe 08/19/2019  . Hyponatremia   . Dyspnea   . Pleural effusion   . Recurrent pleural effusion on left   . Malignant pleural effusion 08/17/2019  . Malnutrition of moderate degree 01/15/2019  . Elevated troponin 01/14/2019   PCP:  Patient, No Pcp Per Pharmacy:   [id number] [address], [address] [place] [address] Phone: [phone] Fax: [phone]     Social Determinants of Health (SDOH) Interventions    Readmission Risk Interventions No flowsheet data found.

## 2019-08-20 NOTE — Consult Note (Signed)
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  He was seen by primary pulmonologist April 2021 with thoracentesis done for moderate pleural effusion. The fluid was lymphocytic exudate by LDH. Cytology was negative.  He was tested for potential TB with GOLD quantiferon which was negative. He was brought to hospital with respiratory distress, hypoantremia and recurrence of pleural effusion which required chest tube draiange. During my eval he had serosang fluid draining actively. Patient is clinically impormved. We discussed possibility of malignancy and will make additional plans for biopsy of possible LLL mass post resolution of overying fluid and repeat cytologic evaluation.   PAST MEDICAL HISTORY   Past Medical History:  Diagnosis Date  . A-fib ([place]) 01/10/2019   pt st this was dx by Dr. Ubaldo Glassing  . Substance abuse ([place])      SURGICAL HISTORY   Past Surgical History:  Procedure Laterality Date  . BACK SURGERY       FAMILY HISTORY   History reviewed. No pertinent family history.   SOCIAL HISTORY   Social History   Tobacco Use  . Smoking status: Current Every Day Smoker    Packs/day: 0.50    Types: Cigarettes  . Smokeless tobacco: Never Used  . Tobacco comment: pt st he cut back in order to breath better  Substance Use Topics  . Alcohol use: Yes  . Drug use: Yes    Comment: heroin     MEDICATIONS    Home Medication:    Current Medication:  Current Facility-Administered Medications:  .  0.9 %  sodium chloride infusion, , Intravenous, Continuous, Amin, Soundra Pilon, MD, Last Rate: 100 mL/hr at 08/20/19 1028, Rate Verify at 08/20/19 1028 .   acetaminophen (TYLENOL) tablet 650 mg, 650 mg, Oral, Q6H PRN **OR** acetaminophen (TYLENOL) suppository 650 mg, 650 mg, Rectal, Q6H PRN, Lorella Nimrod, MD .  azithromycin (ZITHROMAX) 500 mg in sodium chloride 0.9 % 250 mL IVPB, 500 mg, Intravenous, Q24H, Lorella Nimrod, MD, Stopped at 08/19/19 2212 .  cefTRIAXone (ROCEPHIN) 2 g in sodium chloride 0.9 % 100 mL IVPB, 2 g, Intravenous, Q24H, Lorella Nimrod, MD, Stopped at 08/19/19 2250 .  Chlorhexidine Gluconate Cloth 2 % PADS 6 each, 6 each, Topical, Daily, Tyler Pita, MD, 6 each at 08/19/19 [phone]  chlorpheniramine-HYDROcodone (TUSSIONEX) 10-8 MG/5ML suspension 5 mL, 5 mL, Oral, Q12H PRN, Lorella Nimrod, MD .  diltiazem (CARDIZEM CD) 24 hr capsule 240 mg, 240 mg, Oral, Daily, Lorella Nimrod, MD, 240 mg at 08/20/19 1013 .  feeding supplement (ENSURE ENLIVE) (ENSURE ENLIVE) liquid 237 mL, 237 mL, Oral, TID BM, Lorella Nimrod, MD, 237 mL at 08/19/19 2000 .  fentaNYL (SUBLIMAZE) injection 50 mcg, 50 mcg, Intravenous, Q2H PRN, Tyler Pita, MD, 50 mcg at 08/20/19 0820 .  guaiFENesin (MUCINEX) 12 hr tablet 600 mg, 600 mg, Oral, BID, Lorella Nimrod, MD, 600 mg at 08/20/19 0821 .  ipratropium-albuterol (DUONEB) 0.5-2.5 (3) MG/3ML nebulizer solution 3 mL, 3 mL, Nebulization, TID, Lorella Nimrod, MD, 3 mL at 08/20/19 0814 .  losartan (COZAAR)  tablet 25 mg, 25 mg, Oral, Daily, Lorella Nimrod, MD, 25 mg at 08/20/19 0821 .  magnesium hydroxide (MILK OF MAGNESIA) suspension 30 mL, 30 mL, Oral, Daily PRN, Lorella Nimrod, MD .  MEDLINE mouth rinse, 15 mL, Mouth Rinse, BID, Lorella Nimrod, MD, 15 mL at 08/20/19 0822 .  ondansetron (ZOFRAN) tablet 4 mg, 4 mg, Oral, Q6H PRN **OR** ondansetron (ZOFRAN) injection 4 mg, 4 mg, Intravenous, Q6H PRN, Lorella Nimrod, MD, 4 mg at 08/18/19 0522 .  thiamine (B-1) injection 100 mg, 100 mg, Intravenous, Daily, Tyler Pita, MD, 100 mg at 08/20/19 0821 .  traMADol (ULTRAM) tablet 50 mg, 50 mg, Oral, Q6H PRN, Tyler Pita, MD, 50 mg at 08/20/19 1013 .  traZODone (DESYREL) tablet 25 mg, 25 mg, Oral, QHS PRN, Lorella Nimrod, MD    ALLERGIES   Patient has no known allergies.     REVIEW OF SYSTEMS    Review of Systems:  Gen:  Denies  fever, sweats, chills weigh loss  HEENT: Denies blurred vision, double vision, ear pain, eye pain, hearing loss, nose bleeds, sore throat Cardiac:  No dizziness, chest pain or heaviness, chest tightness,edema Resp:   Denies cough or sputum porduction, shortness of breath,wheezing, hemoptysis,  Gi: Denies swallowing difficulty, stomach pain, nausea or vomiting, diarrhea, constipation, bowel incontinence Gu:  Denies bladder incontinence, burning urine Ext:   Denies Joint pain, stiffness or swelling Skin: Denies  skin rash, easy bruising or bleeding or hives Endoc:  Denies polyuria, polydipsia , polyphagia or weight change Psych:   Denies depression, insomnia or hallucinations   Other:  All other systems negative   VS: BP 118/78   Pulse (!) 116   Temp 97.9 F (36.6 C)   Resp 16   Ht 6\' 2"  (1.88 m)   Wt 62.9 kg   SpO2 96%   BMI 17.80 kg/m      PHYSICAL EXAM    GENERAL:NAD, no fevers, chills, no weakness no fatigue HEAD: Normocephalic, atraumatic.  EYES: Pupils equal, round, reactive to light. Extraocular muscles intact. No scleral icterus.  MOUTH: Moist mucosal membrane. Dentition intact. No abscess noted.  EAR, NOSE, THROAT: Clear without exudates. No external lesions.  NECK: Supple. No thyromegaly. No nodules. No JVD.  PULMONARY: mild rhocnhi bilaterally  CARDIOVASCULAR: S1 and S2. Regular rate and rhythm. No murmurs, rubs, or gallops. No edema. Pedal pulses 2+ bilaterally.  GASTROINTESTINAL: Soft, nontender, nondistended. No masses. Positive bowel sounds. No hepatosplenomegaly.  MUSCULOSKELETAL: No swelling, clubbing, or edema. Range of motion full in all extremities.  NEUROLOGIC: Cranial nerves II through XII are intact. No gross focal neurological  deficits. Sensation intact. Reflexes intact.  SKIN: No ulceration, lesions, rashes, or cyanosis. Skin warm and dry. Turgor intact.  PSYCHIATRIC: Mood, affect within normal limits. The patient is awake, alert and oriented x 3. Insight, judgment intact.       IMAGING    DG Chest 1 View  Result Date: 08/02/2019 CLINICAL DATA:  Left pleural effusion, post thoracentesis EXAM: CHEST  1 VIEW COMPARISON:  07/22/2019 FINDINGS: No pneumothorax. Trace residual left effusion suspected. Patchy consolidation/atelectasis at the left lung base as before. Right lung clear. Heart size and mediastinal contours are within normal limits. Aortic Atherosclerosis (ICD10-170.0). Visualized bones unremarkable. IMPRESSION: No pneumothorax post thoracentesis. Electronically Signed   By: Lucrezia Europe M.D.   On: 08/02/2019 15:04   DG Chest 2 View  Result Date: 08/17/2019 CLINICAL DATA:  Short of breath, recent diagnosis of pneumonia, recent thoracentesis EXAM: CHEST -  2 VIEW COMPARISON:  08/02/2019 FINDINGS: Frontal and lateral views of the chest demonstrate increased left pleural effusion and left basilar consolidation. Right chest is clear. No pneumothorax. Cardiac silhouette is obscured by the consolidation and effusion at the left lung base. No acute bony abnormalities. IMPRESSION: 1. Progressive left basilar consolidation and effusion. Electronically Signed   By: Randa Ngo M.D.   On: 08/17/2019 19:46   CT CHEST WO CONTRAST  Result Date: 08/19/2019 CLINICAL DATA:  Inpatient. Status post left pleural chest tube placement. Follow-up left lower lobe lung mass. EXAM: CT CHEST WITHOUT CONTRAST TECHNIQUE: Multidetector CT imaging of the chest was performed following the standard protocol without IV contrast. COMPARISON:  08/17/2019 chest CT. 08/18/2019 chest radiograph. FINDINGS: Cardiovascular: Normal heart size. No significant pericardial effusion/thickening. Three-vessel coronary atherosclerosis. Atherosclerotic  nonaneurysmal thoracic aorta. Normal caliber pulmonary arteries. Mediastinum/Nodes: No discrete thyroid nodules. Unremarkable esophagus. No axillary adenopathy. Stable enlarged 1.0 cm left prevascular node (series 2/image 79) at the level of the pulmonic trunk. Stable enlarged 2.0 cm subcarinal node (series 2/image 79). Stable mild AP window adenopathy up to 1.0 cm (series 2/image 64). Stable enlarged 1.9 cm left infrahilar node (series 2/image 89). Stable enlarged 1.6 cm lower right paraesophageal node (series 2/image 128). No discrete right hilar adenopathy on these noncontrast images. Lungs/Pleura: No right pneumothorax. No right pleural effusion. Interval placement of left pleural pigtail drain terminating in the peripheral mid to lower left pleural space. Small residual left hydropneumothorax with minimal apical pneumothorax component. Irregular nodular dependent basilar left pleural thickening, unchanged. Poorly delineated 6.7 x 4.0 cm medial basilar left lower lobe lung mass (series 2/image 121). Moderate centrilobular and paraseptal emphysema. Lingular 1.0 cm solid pulmonary nodule (series 3/image 108). Interlobular septal thickening and patchy ground-glass opacity throughout lingula and left lower lobe with residual moderate atelectasis at the left lung base. Upper abdomen: Enlarged 2.0 cm gastrohepatic ligament node (series 2/image 152). Enlarged 1.7 cm left para-aortic node (series 2/image 165). Musculoskeletal: No aggressive appearing focal osseous lesions. Mild thoracic spondylosis. IMPRESSION: 1. Interval placement of left pleural pigtail drain. Small residual left hydropneumothorax with minimal apical pneumothorax component. 2. Poorly delineated 6.7 x 4.0 cm medial basilar left lower lobe lung mass, highly suspicious for primary bronchogenic carcinoma. 3. Lingular 1.0 cm solid pulmonary nodule, suspicious for ipsilateral metastasis. 4. Interlobular septal thickening and patchy ground-glass opacity  throughout the lingula and left lower lobe with residual moderate atelectasis at the left lung base. 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 5. Ipsilateral infrahilar, subcarinal, AP window, prevascular and upper retroperitoneal lymphadenopathy compatible with metastatic disease. PET-CT suggested for complete staging evaluation. 6. Three-vessel coronary atherosclerosis. 7. Aortic Atherosclerosis (ICD10-I70.0) and Emphysema (ICD10-J43.9). Electronically Signed   By: Ilona Sorrel M.D.   On: 08/19/2019 08:08   CT Chest W Contrast  Result Date: 08/17/2019 CLINICAL DATA:  Shortness of breath. EXAM: CT CHEST WITH CONTRAST TECHNIQUE: Multidetector CT imaging of the chest was performed during intravenous contrast administration. CONTRAST:  25mL OMNIPAQUE IOHEXOL 300 MG/ML  SOLN COMPARISON:  June 11, 2004 FINDINGS: Cardiovascular: There is mild calcification of the aortic arch. The pulmonary arteries are limited in evaluation secondary to suboptimal opacification with intravenous contrast. No intraluminal filling defects are identified. Normal heart size. No pericardial effusion. Marked severity coronary artery calcification is seen Mediastinum/Nodes: Multiple subcentimeter pretracheal lymph nodes are seen. Lungs/Pleura: There is mild emphysematous lung disease. Moderate to marked severity consolidation is seen throughout the left lower lobe. A 6.3 cm x 3.4 cm x 4.1 cm heterogeneous  low-attenuation soft tissue mass is seen within the posteromedial aspect of the left lung base (axial CT image 144, CT series number 2/coronal re-formatted image 97, CT series number 5). There is a large left pleural effusion. No pneumothorax is identified. Upper Abdomen: Enlarged, necrotic lymph nodes are seen within the para-aortic and aortocaval regions. A 3.9 cm x 2.9 cm and 2.2 cm x 1.8 cm heterogeneous low-attenuation soft tissue masses are seen in between the medial aspect of the inferior vena cava and distal aspect of the  descending thoracic aorta (axial CT images 137 through 135, CT series number 2). A 1.9 cm x 1.6 cm cystic appearing areas seen within the anterior aspect of the mid right kidney. Musculoskeletal: No chest wall abnormality. No acute or significant osseous findings. IMPRESSION: 1. 6.3 cm x 3.4 cm x 4.1 cm heterogeneous left lower lobe lung mass, worrisome for malignancy. 2. Moderate to marked severity left lower lobe consolidation. 3. Large left pleural effusion. 4. Enlarged, necrotic para-aortic and aortocaval lymph nodes within the chest and abdomen, consistent with metastatic disease. Aortic Atherosclerosis (ICD10-I70.0). Electronically Signed   By: Virgina Norfolk M.D.   On: 08/17/2019 21:14   DG Chest Port 1 View  Result Date: 08/18/2019 CLINICAL DATA:  Left-sided chest tube placement. EXAM: PORTABLE CHEST 1 VIEW COMPARISON:  Aug 17, 2019 FINDINGS: Since the prior study there is been interval placement of a left-sided chest tube. Its distal tip is seen overlying the lateral aspect of the mid left lung. There is a large, stable left pleural effusion. No pneumothorax is identified. The heart size and mediastinal contours are within normal limits. Degenerative changes seen within the mid to lower thoracic spine. IMPRESSION: 1. Interval placement of a left-sided chest tube. 2. Large, stable left pleural effusion. 3. No pneumothorax. Electronically Signed   By: Virgina Norfolk M.D.   On: 08/18/2019 16:36   ECHOCARDIOGRAM COMPLETE  Result Date: 08/19/2019    ECHOCARDIOGRAM REPORT   Patient Name:   KATLIN CISZEWSKI Date of Exam: 08/19/2019 Medical Rec #:  [id number]    Height:       74.0 in Accession #:    [id number]   Weight:       138.7 lb Date of Birth:  October 07, 1950     BSA:          1.860 m Patient Age:    14 years     BP:           136/74 mmHg Patient Gender: M            HR:           97 bpm. Exam Location:  [facility] Procedure: 2D Echo, Color Doppler and Cardiac Doppler Indications:     Dyspnea 786.09  History:          Patient has prior history of Echocardiogram examinations, most                  recent 01/14/2019. Arrythmias:Atrial Fibrillation.  Sonographer:     Sherrie Sport RDCS (AE) Referring Phys:  [id number] Tyler Pita Diagnosing Phys: Kathlyn Sacramento MD  Sonographer Comments: No apical window. Large bandage in apical space. IMPRESSIONS  1. Left ventricular ejection fraction, by estimation, is 50 to 55%. The left ventricle has low normal function. Left ventricular endocardial border not optimally defined to evaluate regional wall motion. There is moderate left ventricular hypertrophy. Left ventricular diastolic function could not be evaluated.  2. Right ventricular systolic function is normal. The  right ventricular size is normal. Tricuspid regurgitation signal is inadequate for assessing PA pressure.  3. The mitral valve is normal in structure. No evidence of mitral valve regurgitation. No evidence of mitral stenosis.  4. The aortic valve is normal in structure. Aortic valve regurgitation is not visualized. Mild aortic valve sclerosis is present, with no evidence of aortic valve stenosis.  5. Very limited study due to lack of apical images. FINDINGS  Left Ventricle: Left ventricular ejection fraction, by estimation, is 50 to 55%. The left ventricle has low normal function. Left ventricular endocardial border not optimally defined to evaluate regional wall motion. The left ventricular internal cavity  size was normal in size. There is moderate left ventricular hypertrophy. Left ventricular diastolic function could not be evaluated. Right Ventricle: The right ventricular size is normal. No increase in right ventricular wall thickness. Right ventricular systolic function is normal. Tricuspid regurgitation signal is inadequate for assessing PA pressure. The tricuspid regurgitant velocity is 2.52 m/s, and with an assumed right atrial pressure of 10 mmHg, the estimated right ventricular systolic pressure is 90.3 mmHg. Left  Atrium: Left atrial size was normal in size. Right Atrium: Right atrial size was normal in size. Pericardium: There is no evidence of pericardial effusion. Mitral Valve: The mitral valve is normal in structure. Normal mobility of the mitral valve leaflets. No evidence of mitral valve regurgitation. No evidence of mitral valve stenosis. Tricuspid Valve: The tricuspid valve is normal in structure. Tricuspid valve regurgitation is trivial. No evidence of tricuspid stenosis. Aortic Valve: The aortic valve is normal in structure. Aortic valve regurgitation is not visualized. Mild aortic valve sclerosis is present, with no evidence of aortic valve stenosis. Pulmonic Valve: The pulmonic valve was normal in structure. Pulmonic valve regurgitation is not visualized. No evidence of pulmonic stenosis. Aorta: The aortic root is normal in size and structure. Venous: The inferior vena cava was not well visualized. IAS/Shunts: No atrial level shunt detected by color flow Doppler.  LEFT VENTRICLE PLAX 2D LVIDd:         3.12 cm LVIDs:         2.21 cm LV PW:         1.21 cm LV IVS:        1.38 cm LVOT diam:     2.00 cm LVOT Area:     3.14 cm  LEFT ATRIUM         Index LA diam:    3.40 cm 1.83 cm/m                        PULMONIC VALVE AORTA                 PV Vmax:        0.38 m/s Ao Root diam: 3.10 cm PV Peak grad:   0.6 mmHg                       RVOT Peak grad: 2 mmHg  TRICUSPID VALVE TR Peak grad:   25.4 mmHg TR Vmax:        252.00 cm/s  SHUNTS Systemic Diam: 2.00 cm Kathlyn Sacramento MD Electronically signed by Kathlyn Sacramento MD Signature Date/Time: 08/19/2019/12:17:53 PM    Final    US THORACENTESIS ASP PLEURAL SPACE W/IMG GUIDE  Result Date: 08/02/2019 INDICATION: Patient with prior history of substance abuse, CHF, pneumonia, dyspnea, left pleural effusion; request received for diagnostic and therapeutic left thoracentesis. EXAM: ULTRASOUND GUIDED  DIAGNOSTIC AND THERAPEUTIC LEFT THORACENTESIS MEDICATIONS: None COMPLICATIONS:  None immediate. PROCEDURE: An ultrasound guided thoracentesis was thoroughly discussed with the patient and questions answered. The benefits, risks, alternatives and complications were also discussed. The patient understands and wishes to proceed with the procedure. Written consent was obtained. Ultrasound was performed to localize and mark an adequate pocket of fluid in the left chest. The area was then prepped and draped in the normal sterile fashion. 1% Lidocaine was used for local anesthesia. Under ultrasound guidance a 6 Fr Safe-T-Centesis catheter was introduced. Thoracentesis was performed. The catheter was removed and a dressing applied. FINDINGS: A total of approximately 900 cc of blood-tinged fluid was removed. Samples were sent to the laboratory as requested by the clinical team. IMPRESSION: Successful ultrasound guided diagnostic and therapeutic left thoracentesis yielding 900 cc of pleural fluid. Follow-up chest x-ray revealed no pneumothorax. Read by: Rowe Robert, PA-C Electronically Signed   By: Lucrezia Europe M.D.   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   - lifelong smoking patient with hyponatremia and constitutional symptoms   -s/p repeat drainage of effusion with chest tube and fluid studies  - pending cytology  - discussed with patient possibility of biopsy he is agreeable , will discuss further -consider oncology evaluation for additional recommendations regarding malignancy   LLL  pneumonia  -present on admission  -potentially due to post obstrucitve process -continue zithromax rocephin -MRSA pCR is negative -Bronchopulmonary hygiene   COPD 3D with advanced centrilobular emphysema  -continue duoNEB  - agree with mucinex  - IS and flutter valve for BPH please     Thank you for allowing me to participate in the care of this patient.   Patient/Family are satisfied with care plan and all  questions have been answered.  This document was prepared using Dragon voice recognition software and may include unintentional dictation errors.     Ottie Glazier, M.D.  Division of [place]

## 2019-08-20 NOTE — Progress Notes (Signed)
Brief cross cover note Evaluated for transfer due to ICU bed need He remains hemodynamically stable. No complications from chest tube. Minimal to no withdrawal symptoms. Sodium level unchanged and asymptomatic.  He is stable to go to [place] with cardiac monitoring

## 2019-08-20 NOTE — Progress Notes (Signed)
[place] for Electrolyte Monitoring and Replacement   Recent Labs: Potassium (mmol/L)  Date Value  08/20/2019 4.1   Magnesium (mg/dL)  Date Value  08/20/2019 1.7   Calcium (mg/dL)  Date Value  08/20/2019 7.9 (L)   Albumin (g/dL)  Date Value  08/17/2019 3.8   Phosphorus (mg/dL)  Date Value  08/20/2019 2.6   Sodium (mmol/L)  Date Value  08/20/2019 129 (L)     Assessment: 69 year old male presented with SOB. Patient with recurrent left large pleural effusion with mass concerning for malignancy. Patient is malnourished and at risk for refeeding syndrome. Pharmacy consulted for electrolytes.  Goal of Therapy:  Electrolytes WNL, K ~ 4 and Mg ~ 2  Plan:  No electrolyte replacement needed today. Will follow up all electrolytes with morning labs.  Durward Fortes ,PharmD Clinical Pharmacist 08/20/2019 11:46 AM

## 2019-08-20 NOTE — Progress Notes (Signed)
Pt in no acute distress at this time. A&Ox4. VSS though ST 130s with movement. Chest tube intact, serosanguinous output. Report called to [facility] RN.

## 2019-08-20 NOTE — Progress Notes (Signed)
PROGRESS NOTE    Scott Jordan  NOB:[id number] DOB: Oct 18, 1950 DOA: 08/17/2019 PCP: Patient, No Pcp Per   Brief Narrative:  Scott Jordan  is a 69 y.o. male with a known history of ongoing tobacco abuse, atrial fibrillation and substance abuse, who presented to the emergency room with acute onset of worsening dyspnea lately with associated dry cough and mild wheezing.  About a month ago he had thoracentesis of left pleural effusion and 2 weeks ago he had it again for recurrence. No reported fever or chills.  He has lost more than 20 pounds over the last 6 months.  Prior thoracentesis results were without any malignant cells.  CTA with a suspicious lung mass and multiple enlarged necrotic lymph nodes consistent with metastatic disease.  Hyponatremia consistent with SIADH.  Subjective: Patient has no new complaints today.  Seems little frustrated regarding plans moving forward.  Assessment & Plan:   Active Problems:   Malignant pleural effusion   Hyponatremia   Dyspnea   Pleural effusion   Recurrent pleural effusion on left   Protein-calorie malnutrition, severe  Recurrent left large pleural effusion likely malignant, with left lung mass concerning for malignancy especially given thoracic and abdominal necrotic lymphadenopathy concerning for metastatic disease. Pulmonary was consulted-thoracentesis was done yesterday with placement of chest tube, more than 2 L removed. -Patient will need biopsy for a tissue diagnosis-pulmonary wants to wait for cytology results from current pleural fluid, prior cytology results were negative.  CT chest very suggestive of malignancy. -Suspecting small cell carcinoma due to concurrent hyponatremia. -Consult oncology for their recommendations.  Sent message to Dr. Mike Gip. -Continue supportive care.  Left basal pneumonia likely postobstructive. -Continue Rocephin and Zithromax. -Continue mucolytic therapy and supportive care.  Hyponatremia.  Labs are more  consistent with SIADH.  Consistent with para neoplastic syndrome. Patient received normal saline for the past 2 days due to concern of dehydration. Continue IV fluid as patient is eating and drinking okay. -currently slowly improving, sodium today was 129.  - If fail to respond he will need salt tablets. -Continue to monitor  Chronic atrial fibrillation.  Currently rate well controlled. -Continue home dose of Cardizem. -Eliquis is being held due to upcoming procedures with chest tube and biopsy.  Hypertension.  Pressure mildly elevated. -Continue with Cozaar.  Alcohol use.  Patient drinks on a regular basis.  CIWA score remains 0. -Place him on CIWA protocol-we will add Ativan if needed.  Tobacco abuse.  Consulting was provided. -Do not want a nicotine patch at this time-can be ordered if needed.  Objective: Vitals:   08/20/19 0800 08/20/19 1000 08/20/19 1130 08/20/19 1208  BP: (!) 144/94 118/78  122/88  Pulse: (!) 109 (!) 116 (!) 102 (!) 104  Resp: 18 16 (!) 21   Temp: 97.9 F (36.6 C)  98.1 F (36.7 C) 97.7 F (36.5 C)  TempSrc:   Oral   SpO2: 97% 96% 97% 98%  Weight:      Height:        Intake/Output Summary (Last 24 hours) at 08/20/2019 1255 Last data filed at 08/20/2019 1028 Gross per 24 hour  Intake 3451.34 ml  Output 1590 ml  Net 1861.34 ml   Filed Weights   08/17/19 1916 08/18/19 1127  Weight: 64.4 kg 62.9 kg    Examination:  General exam: Emaciated gentleman, appears calm and comfortable  Respiratory system: Mildly decreased breath sounds at left base with a patent chest tube in place, respiratory effort normal. Cardiovascular system: Irregularly  irregular with normal rate. No JVD, murmurs, rubs, gallops or clicks. Gastrointestinal system: Soft, nontender, nondistended, bowel sounds positive. Central nervous system: Alert and oriented. No focal neurological deficits. Extremities: No edema, no cyanosis, pulses intact and symmetrical. Psychiatry: Judgement  and insight appear normal.   DVT prophylaxis: Lovenox Code Status: Full Family Communication: Updated the niece on phone. Disposition Plan:  Status is: Inpatient  Remains inpatient appropriate because:Inpatient level of care appropriate due to severity of illness   Dispo: The patient is from: Home              Anticipated d/c is to: To be determined              Anticipated d/c date is: 2 days              Patient currently is not medically stable to d/c.  Patient with metastatic lung disease, needs tissue diagnosis and the plan moving forward before discharge.  Consultants:   Pulmonary  IR  Oncology  Procedures:  Antimicrobials:  Rocephin Zithromax  Data Reviewed: I have personally reviewed following labs and imaging studies  CBC: Recent Labs  Lab 08/17/19 1921 08/18/19 0359 08/19/19 0622  WBC 9.4 11.3* 8.4  NEUTROABS  --   --  6.2  HGB 15.1 14.5 12.9*  HCT 41.3 40.6 37.2*  MCV 94.9 96.9 99.5  PLT 197 181 295   Basic Metabolic Panel: Recent Labs  Lab 08/18/19 2231 08/19/19 0622 08/19/19 1359 08/19/19 2221 08/20/19 0628  NA 126* 127* 127* 127* 129*  K 4.0 4.2 3.9 4.0 4.1  CL 92* 94* 94* 95* 96*  CO2 27 27 27 27 27   GLUCOSE 94 116* 112* 99 113*  BUN 12 11 10 12 9   CREATININE 0.80 0.63 0.83 0.75 0.64  CALCIUM 8.0* 8.0* 7.8* 7.8* 7.9*  MG  --  1.7  --   --  1.7  PHOS  --  3.3  --   --  2.6   GFR: Estimated Creatinine Clearance: 79.7 mL/min (by C-G formula based on SCr of 0.64 mg/dL). Liver Function Tests: Recent Labs  Lab 08/17/19 1921  AST 33  ALT 21  ALKPHOS 87  BILITOT 0.8  PROT 7.9  ALBUMIN 3.8   No results for input(s): LIPASE, AMYLASE in the last 168 hours. No results for input(s): AMMONIA in the last 168 hours. Coagulation Profile: No results for input(s): INR, PROTIME in the last 168 hours. Cardiac Enzymes: No results for input(s): CKTOTAL, CKMB, CKMBINDEX, TROPONINI in the last 168 hours. BNP (last 3 results) No results for  input(s): PROBNP in the last 8760 hours. HbA1C: No results for input(s): HGBA1C in the last 72 hours. CBG: No results for input(s): GLUCAP in the last 168 hours. Lipid Profile: No results for input(s): CHOL, HDL, LDLCALC, TRIG, CHOLHDL, LDLDIRECT in the last 72 hours. Thyroid Function Tests: Recent Labs    08/17/19 1921  TSH 0.858   Anemia Panel: No results for input(s): VITAMINB12, FOLATE, FERRITIN, TIBC, IRON, RETICCTPCT in the last 72 hours. Sepsis Labs: No results for input(s): PROCALCITON, LATICACIDVEN in the last 168 hours.  Recent Results (from the past 240 hour(s))  Culture, sputum-assessment     Status: None   Collection Time: 08/17/19 10:59 PM   Specimen: Expectorated Sputum  Result Value Ref Range Status   Specimen Description EXPECTORATED SPUTUM  Final   Special Requests NONE  Final   Sputum evaluation   Final    Sputum specimen not acceptable for testing.  Please recollect.   NOTIFIED Scott Jordan 08/18/19 AT 0240 HS Performed at [facility], [address] [place], [place] [address]    Report Status 08/18/2019 FINAL  Final  Respiratory Panel by RT PCR (Flu A&B, Covid) - Nasopharyngeal Swab     Status: None   Collection Time: 08/18/19 12:06 AM   Specimen: Nasopharyngeal Swab  Result Value Ref Range Status   SARS Coronavirus 2 by RT PCR NEGATIVE NEGATIVE Final    Comment: (NOTE) SARS-CoV-2 target nucleic acids are NOT DETECTED. The SARS-CoV-2 RNA is generally detectable in upper respiratoy specimens during the acute phase of infection. The lowest concentration of SARS-CoV-2 viral copies this assay can detect is 131 copies/mL. A negative result does not preclude SARS-Cov-2 infection and should not be used as the sole basis for treatment or other patient management decisions. A negative result may occur with  improper specimen collection/handling, submission of specimen other than nasopharyngeal swab, presence of viral mutation(s) within the areas  targeted by this assay, and inadequate number of viral copies (<131 copies/mL). A negative result must be combined with clinical observations, patient history, and epidemiological information. The expected result is Negative. Fact Sheet for Patients:  PinkCheek.be Fact Sheet for Healthcare Providers:  GravelBags.it This test is not yet ap proved or cleared by the [place] FDA and  has been authorized for detection and/or diagnosis of SARS-CoV-2 by FDA under an Emergency Use Authorization (EUA). This EUA will remain  in effect (meaning this test can be used) for the duration of the COVID-19 declaration under Section 564(b)(1) of the Act, 21 U.S.C. section 360bbb-3(b)(1), unless the authorization is terminated or revoked sooner.    Influenza A by PCR NEGATIVE NEGATIVE Final   Influenza B by PCR NEGATIVE NEGATIVE Final    Comment: (NOTE) The Xpert Xpress SARS-CoV-2/FLU/RSV assay is intended as an aid in  the diagnosis of influenza from Nasopharyngeal swab specimens and  should not be used as a sole basis for treatment. Nasal washings and  aspirates are unacceptable for Xpert Xpress SARS-CoV-2/FLU/RSV  testing. Fact Sheet for Patients: PinkCheek.be Fact Sheet for Healthcare Providers: GravelBags.it This test is not yet approved or cleared by the [place] FDA and  has been authorized for detection and/or diagnosis of SARS-CoV-2 by  FDA under an Emergency Use Authorization (EUA). This EUA will remain  in effect (meaning this test can be used) for the duration of the  Covid-19 declaration under Section 564(b)(1) of the Act, 21  U.S.C. section 360bbb-3(b)(1), unless the authorization is  terminated or revoked. Performed at [facility], [address] [place], [place] [address]   MRSA PCR Screening     Status: None   Collection Time: 08/18/19 11:32 AM    Specimen: Nasopharyngeal  Result Value Ref Range Status   MRSA by PCR NEGATIVE NEGATIVE Final    Comment:        The GeneXpert MRSA Assay (FDA approved for NASAL specimens only), is one component of a comprehensive MRSA colonization surveillance program. It is not intended to diagnose MRSA infection nor to guide or monitor treatment for MRSA infections. Performed at [facility], [address], [place], [place] [address]      Radiology Studies: CT CHEST WO CONTRAST  Result Date: 08/19/2019 CLINICAL DATA:  Inpatient. Status post left pleural chest tube placement. Follow-up left lower lobe lung mass. EXAM: CT CHEST WITHOUT CONTRAST TECHNIQUE: Multidetector CT imaging of the chest was performed following the standard protocol without IV contrast.  COMPARISON:  08/17/2019 chest CT. 08/18/2019 chest radiograph. FINDINGS: Cardiovascular: Normal heart size. No significant pericardial effusion/thickening. Three-vessel coronary atherosclerosis. Atherosclerotic nonaneurysmal thoracic aorta. Normal caliber pulmonary arteries. Mediastinum/Nodes: No discrete thyroid nodules. Unremarkable esophagus. No axillary adenopathy. Stable enlarged 1.0 cm left prevascular node (series 2/image 79) at the level of the pulmonic trunk. Stable enlarged 2.0 cm subcarinal node (series 2/image 79). Stable mild AP window adenopathy up to 1.0 cm (series 2/image 64). Stable enlarged 1.9 cm left infrahilar node (series 2/image 89). Stable enlarged 1.6 cm lower right paraesophageal node (series 2/image 128). No discrete right hilar adenopathy on these noncontrast images. Lungs/Pleura: No right pneumothorax. No right pleural effusion. Interval placement of left pleural pigtail drain terminating in the peripheral mid to lower left pleural space. Small residual left hydropneumothorax with minimal apical pneumothorax component. Irregular nodular dependent basilar left pleural thickening, unchanged. Poorly delineated 6.7 x  4.0 cm medial basilar left lower lobe lung mass (series 2/image 121). Moderate centrilobular and paraseptal emphysema. Lingular 1.0 cm solid pulmonary nodule (series 3/image 108). Interlobular septal thickening and patchy ground-glass opacity throughout lingula and left lower lobe with residual moderate atelectasis at the left lung base. Upper abdomen: Enlarged 2.0 cm gastrohepatic ligament node (series 2/image 152). Enlarged 1.7 cm left para-aortic node (series 2/image 165). Musculoskeletal: No aggressive appearing focal osseous lesions. Mild thoracic spondylosis. IMPRESSION: 1. Interval placement of left pleural pigtail drain. Small residual left hydropneumothorax with minimal apical pneumothorax component. 2. Poorly delineated 6.7 x 4.0 cm medial basilar left lower lobe lung mass, highly suspicious for primary bronchogenic carcinoma. 3. Lingular 1.0 cm solid pulmonary nodule, suspicious for ipsilateral metastasis. 4. Interlobular septal thickening and patchy ground-glass opacity throughout the lingula and left lower lobe with residual moderate atelectasis at the left lung base. 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 5. Ipsilateral infrahilar, subcarinal, AP window, prevascular and upper retroperitoneal lymphadenopathy compatible with metastatic disease. PET-CT suggested for complete staging evaluation. 6. Three-vessel coronary atherosclerosis. 7. Aortic Atherosclerosis (ICD10-I70.0) and Emphysema (ICD10-J43.9). Electronically Signed   By: Ilona Sorrel M.D.   On: 08/19/2019 08:08   DG Chest Port 1 View  Result Date: 08/18/2019 CLINICAL DATA:  Left-sided chest tube placement. EXAM: PORTABLE CHEST 1 VIEW COMPARISON:  Aug 17, 2019 FINDINGS: Since the prior study there is been interval placement of a left-sided chest tube. Its distal tip is seen overlying the lateral aspect of the mid left lung. There is a large, stable left pleural effusion. No pneumothorax is identified. The heart size and mediastinal  contours are within normal limits. Degenerative changes seen within the mid to lower thoracic spine. IMPRESSION: 1. Interval placement of a left-sided chest tube. 2. Large, stable left pleural effusion. 3. No pneumothorax. Electronically Signed   By: Virgina Norfolk M.D.   On: 08/18/2019 16:36   ECHOCARDIOGRAM COMPLETE  Result Date: 08/19/2019    ECHOCARDIOGRAM REPORT   Patient Name:   Scott Jordan Date of Exam: 08/19/2019 Medical Rec #:  [id number]    Height:       74.0 in Accession #:    [id number]   Weight:       138.7 lb Date of Birth:  1950/12/28     BSA:          1.860 m Patient Age:    45 years     BP:           136/74 mmHg Patient Gender: M            HR:  97 bpm. Exam Location:  [facility] Procedure: 2D Echo, Color Doppler and Cardiac Doppler Indications:     Dyspnea 786.09  History:         Patient has prior history of Echocardiogram examinations, most                  recent 01/14/2019. Arrythmias:Atrial Fibrillation.  Sonographer:     Sherrie Sport RDCS (AE) Referring Phys:  [id number] [address] Pita Diagnosing Phys: Kathlyn Sacramento MD  Sonographer Comments: No apical window. Large bandage in apical space. IMPRESSIONS  1. Left ventricular ejection fraction, by estimation, is 50 to 55%. The left ventricle has low normal function. Left ventricular endocardial border not optimally defined to evaluate regional wall motion. There is moderate left ventricular hypertrophy. Left ventricular diastolic function could not be evaluated.  2. Right ventricular systolic function is normal. The right ventricular size is normal. Tricuspid regurgitation signal is inadequate for assessing PA pressure.  3. The mitral valve is normal in structure. No evidence of mitral valve regurgitation. No evidence of mitral stenosis.  4. The aortic valve is normal in structure. Aortic valve regurgitation is not visualized. Mild aortic valve sclerosis is present, with no evidence of aortic valve stenosis.  5. Very limited study due to lack  of apical images. FINDINGS  Left Ventricle: Left ventricular ejection fraction, by estimation, is 50 to 55%. The left ventricle has low normal function. Left ventricular endocardial border not optimally defined to evaluate regional wall motion. The left ventricular internal cavity  size was normal in size. There is moderate left ventricular hypertrophy. Left ventricular diastolic function could not be evaluated. Right Ventricle: The right ventricular size is normal. No increase in right ventricular wall thickness. Right ventricular systolic function is normal. Tricuspid regurgitation signal is inadequate for assessing PA pressure. The tricuspid regurgitant velocity is 2.52 m/s, and with an assumed right atrial pressure of 10 mmHg, the estimated right ventricular systolic pressure is 45.8 mmHg. Left Atrium: Left atrial size was normal in size. Right Atrium: Right atrial size was normal in size. Pericardium: There is no evidence of pericardial effusion. Mitral Valve: The mitral valve is normal in structure. Normal mobility of the mitral valve leaflets. No evidence of mitral valve regurgitation. No evidence of mitral valve stenosis. Tricuspid Valve: The tricuspid valve is normal in structure. Tricuspid valve regurgitation is trivial. No evidence of tricuspid stenosis. Aortic Valve: The aortic valve is normal in structure. Aortic valve regurgitation is not visualized. Mild aortic valve sclerosis is present, with no evidence of aortic valve stenosis. Pulmonic Valve: The pulmonic valve was normal in structure. Pulmonic valve regurgitation is not visualized. No evidence of pulmonic stenosis. Aorta: The aortic root is normal in size and structure. Venous: The inferior vena cava was not well visualized. IAS/Shunts: No atrial level shunt detected by color flow Doppler.  LEFT VENTRICLE PLAX 2D LVIDd:         3.12 cm LVIDs:         2.21 cm LV PW:         1.21 cm LV IVS:        1.38 cm LVOT diam:     2.00 cm LVOT Area:     3.14  cm  LEFT ATRIUM         Index LA diam:    3.40 cm 1.83 cm/m                        PULMONIC VALVE AORTA  PV Vmax:        0.38 m/s Ao Root diam: 3.10 cm PV Peak grad:   0.6 mmHg                       RVOT Peak grad: 2 mmHg  TRICUSPID VALVE TR Peak grad:   25.4 mmHg TR Vmax:        252.00 cm/s  SHUNTS Systemic Diam: 2.00 cm Kathlyn Sacramento MD Electronically signed by Kathlyn Sacramento MD Signature Date/Time: 08/19/2019/12:17:53 PM    Final     Scheduled Meds: . Chlorhexidine Gluconate Cloth  6 each Topical Daily  . diltiazem  240 mg Oral Daily  . feeding supplement (ENSURE ENLIVE)  237 mL Oral TID BM  . guaiFENesin  600 mg Oral BID  . ipratropium-albuterol  3 mL Nebulization TID  . losartan  25 mg Oral Daily  . mouth rinse  15 mL Mouth Rinse BID  . thiamine injection  100 mg Intravenous Daily   Continuous Infusions: . sodium chloride 100 mL/hr at 08/20/19 1028  . azithromycin Stopped (08/19/19 2212)  . cefTRIAXone (ROCEPHIN)  IV Stopped (08/19/19 2250)     LOS: 3 days   Time spent: 40 minutes.  Lorella Nimrod, MD [facility]  If 7PM-7AM, please contact night-coverage [email or web address]  08/20/2019, 12:55 PM   This record has been created using Systems analyst. Errors have been sought and corrected,but may not always be located. Such creation errors do not reflect on the standard of care.

## 2019-08-20 NOTE — Consult Note (Signed)
[facility]  Date of admission:  08/17/2019  Inpatient day:  08/20/2019  Consulting physician:  Dr Lorella Nimrod   Reason for Consultation:  Suspecting lung malignancy  Chief Complaint: Scott Jordan is a 69 y.o. male with a smoking history and recurrent left sided pleural effusion who was admitted with a post-obstructive pneumonia and hyponatremia.  HPI: The patient notes a greater than 40-pack-year smoking history.  He began smoking at age 69.  He has been smoking 1 pack a day since that time although in the past 2 to 3 weeks he is cut back to 1 pack a week secondary to increased shortness of breath and cough.  He notes a decrease in appetite over the past 6 months.  He has lost about 15-20 pounds.  The patient was admitted to [facility] on 07/21/2019 with atrial flutter and pneumonia.  He had a left-sided pleural effusion.  Thoracentesis revealed a lymphocytic predominant exudative effusion.  Cytology was negative.  He was seen by Dr. Raul Del on 07/29/2019 with the recurrent effusion.  Thoracentesis on 08/02/2019 revealed similar revealed a negative cytology.  Fluid was bloody.  And was noted to have lymphocytes predominate on differential.  QuantiFERON gold was negative.  The patient presented to [facility] ER on 08/17/2019.  He noted increased shortness of breath and a nonproductive cough.  He denies any fever chills or sweats.  Chest CT revealed a 6.3 x 3.4 x 4.1 cm heterogeneous left lower lobe lung mass worrisome for malignancy.  There was moderate to marked severe left lower lobe consolidation as well as a large left pleural effusion.  There were enlarged necrotic periaortic and aortocaval lymph nodes within the abdomen and pelvis.  There was a 3.9 x 2.9 cmand a 2.2 x 1.8 cm low-attenuation soft tissue mass in between the medial aspect of the inferior vena cava and distal aspect of the descending thoracic aorta.  He underwent left chest tube placement on 08/18/2019.   Pleural fluid cytology is pending; results will be available on 08/22/2019  Chest CT without contrast on 08/19/2019 revealed the left pleural pigtail drain.  There was a small residual left hydropneumothorax.  There was a 6.7 x 4 cm medial basilar left lower lobe lung mass suspicious for primary bronchogenic carcinoma.  There was a lingular 1.0 cm solid pulmonary nodule suspicious for ipsilateral metastasis.  There was intralobular septal thickening and patchy groundglass opacity throughout the lingula and the left lower lobe with moderate residual atelectasis of the left lung base.  A component of lymphangitic carcinomatosis was suspected.  There was ipsilateral infra hilar, subcarinal, AP window, prevascular and upper retroperitoneal adenopathy compatible with metastatic disease.  Symptomatically, he notes that he has been breathing better since pigtail catheter placement.  He notes being dizzy if he gets up too quickly.  He has had no falls.  He denies any headache, visual changes, focal numbness, weakness balance or coordination issues.  The patient has a family history of malignancy.  His father died of lung cancer.  He was a heavy smoker.  His mother and maternal aunt died with colon cancer.  His brother died of liver cancer.  There has been some genetic testing in the family (niece will obtain report).   Past Medical History:  Diagnosis Date  . A-fib ([place]) 01/10/2019   pt st this was dx by Dr. Ubaldo Glassing  . Substance abuse [facility])     Past Surgical History:  Procedure Laterality Date  . BACK SURGERY  History reviewed. No pertinent family history.  Social History:  reports that he has been smoking cigarettes. He has been smoking about 0.50 packs per day. He has never used smokeless tobacco. He reports current alcohol use. He reports current drug use.  The patient denies any exposure to radiation or toxins.  The patient lives in [place].  He is alone today.  At the end of our conversation, he  called his niece for me to discuss his current medical condition and plans.  He would like his niece to be his medical power of attorney.   Allergies: No Known Allergies  Medications Prior to Admission  Medication Sig Dispense Refill  . diltiazem (CARDIZEM CD) 240 MG 24 hr capsule Take 240 mg by mouth daily.    Marland Kitchen ELIQUIS 5 MG TABS tablet Take 5 mg by mouth 2 (two) times daily.    Marland Kitchen losartan (COZAAR) 25 MG tablet Take 25 mg by mouth daily.      Review of Systems: GENERAL:  Fstigue.  No fevers, sweats.  Weight loss of 20 pounds in the past 6 months. PERFORMANCE STATUS (ECOG):  2 HEENT:  No visual changes, runny nose, sore throat, mouth sores or tenderness. Lungs: "Can't catch breath"- improved s/p pigtail; drain. Dry cough.  No hemoptysis. Cardiac:  No chest pain, palpitations, orthopnea, or PND. GI:  Poor appetite.  No nausea, vomiting, diarrhea, constipation, melena or hematochezia. GU:  No urgency, frequency, dysuria, or hematuria. Musculoskeletal:  No back pain.  No joint pain.  No muscle tenderness. Extremities:  No pain or swelling. Skin:  No rashes or skin changes. Neuro:  Dizzy if gets up quickly.  No falls.  No headache, numbness or weakness, balance or coordination issues. Endocrine:  No diabetes, thyroid issues, hot flashes or night sweats. Psych:  No mood changes, depression or anxiety. Pain:  No focal pain. Review of systems:  All other systems reviewed and found to be negative.  Physical Exam:  Blood pressure (!) 146/95, pulse 64, temperature 98.1 F (36.7 C), temperature source Oral, resp. rate 20, height 6\' 2"  (1.88 m), weight 138 lb 10.7 oz (62.9 kg), SpO2 97 %.  GENERAL:  Thin gentleman sitting comfortably on the medical unit in no acute distress. MENTAL STATUS:  Alert and oriented to person, place and time. HEAD:  Thick hair.  Graying beard.  Normocephalic, atraumatic, face symmetric, no Cushingoid features. EYES:  Blue eyes.  Pupils equal round and reactive to  light and accomodation.  No conjunctivitis or scleral icterus. ENT:  Oropharynx clear without lesion.  Tongue normal.  Upper dentures.  Mucous membranes moist.  RESPIRATORY:  Coarse breath sounds bilaterally on deep inspiration.  Decreased breath sounds left base.  No wheezes.  Left sided chest tube in place. CARDIOVASCULAR:  Regular rate and rhythm without murmur, rub or gallop. ABDOMEN:  Soft, non-tender, with active bowel sounds, and no hepatosplenomegaly.  No masses. SKIN:  No rashes, ulcers or lesions. EXTREMITIES: No edema, no skin discoloration or tenderness.  No palpable cords. LYMPH NODES: No palpable cervical, supraclavicular, axillary or inguinal adenopathy  NEUROLOGICAL: Unremarkable. PSYCH:  Appropriate.   Results for orders placed or performed during the hospital encounter of 08/17/19 (from the past 48 hour(s))  Basic metabolic panel     Status: Abnormal   Collection Time: 08/18/19 10:31 PM  Result Value Ref Range   Sodium 126 (L) 135 - 145 mmol/L   Potassium 4.0 3.5 - 5.1 mmol/L   Chloride 92 (L) 98 - 111 mmol/L  CO2 27 22 - 32 mmol/L   Glucose, Bld 94 70 - 99 mg/dL    Comment: Glucose reference range applies only to samples taken after fasting for at least 8 hours.   BUN 12 8 - 23 mg/dL   Creatinine, Ser 0.80 0.61 - 1.24 mg/dL   Calcium 8.0 (L) 8.9 - 10.3 mg/dL   GFR calc non Af Amer >60 >60 mL/min   GFR calc Af Amer >60 >60 mL/min   Anion gap 7 5 - 15    Comment: Performed at [facility], [address] [place], [place] [address]  Basic metabolic panel     Status: Abnormal   Collection Time: 08/19/19  6:22 AM  Result Value Ref Range   Sodium 127 (L) 135 - 145 mmol/L   Potassium 4.2 3.5 - 5.1 mmol/L   Chloride 94 (L) 98 - 111 mmol/L   CO2 27 22 - 32 mmol/L   Glucose, Bld 116 (H) 70 - 99 mg/dL    Comment: Glucose reference range applies only to samples taken after fasting for at least 8 hours.   BUN 11 8 - 23 mg/dL   Creatinine, Ser 0.63 0.61 - 1.24  mg/dL   Calcium 8.0 (L) 8.9 - 10.3 mg/dL   GFR calc non Af Amer >60 >60 mL/min   GFR calc Af Amer >60 >60 mL/min   Anion gap 6 5 - 15    Comment: Performed at [facility], [address] [place], [place] [address]  CBC with Differential/Platelet     Status: Abnormal   Collection Time: 08/19/19  6:22 AM  Result Value Ref Range   WBC 8.4 4.0 - 10.5 K/uL   RBC 3.74 (L) 4.22 - 5.81 MIL/uL   Hemoglobin 12.9 (L) 13.0 - 17.0 g/dL   HCT 37.2 (L) 39.0 - 52.0 %   MCV 99.5 80.0 - 100.0 fL   MCH 34.5 (H) 26.0 - 34.0 pg   MCHC 34.7 30.0 - 36.0 g/dL   RDW 12.1 11.5 - 15.5 %   Platelets 158 150 - 400 K/uL   nRBC 0.0 0.0 - 0.2 %   Neutrophils Relative % 73 %   Neutro Abs 6.2 1.7 - 7.7 K/uL   Lymphocytes Relative 12 %   Lymphs Abs 1.0 0.7 - 4.0 K/uL   Monocytes Relative 11 %   Monocytes Absolute 0.9 0.1 - 1.0 K/uL   Eosinophils Relative 1 %   Eosinophils Absolute 0.1 0.0 - 0.5 K/uL   Basophils Relative 1 %   Basophils Absolute 0.1 0.0 - 0.1 K/uL   Immature Granulocytes 2 %   Abs Immature Granulocytes 0.18 (H) 0.00 - 0.07 K/uL    Comment: Performed at [facility], [address] [place], [place] [address]  Magnesium     Status: None   Collection Time: 08/19/19  6:22 AM  Result Value Ref Range   Magnesium 1.7 1.7 - 2.4 mg/dL    Comment: Performed at [facility], [address] [place], [place] [address]  Phosphorus     Status: None   Collection Time: 08/19/19  6:22 AM  Result Value Ref Range   Phosphorus 3.3 2.5 - 4.6 mg/dL    Comment: Performed at [facility], [address], [place],  [address]  Basic metabolic panel     Status: Abnormal   Collection Time: 08/19/19  1:59 PM  Result Value Ref Range   Sodium 127 (L) 135 - 145 mmol/L   Potassium  3.9 3.5 - 5.1 mmol/L   Chloride 94 (L) 98 - 111 mmol/L   CO2 27 22 - 32 mmol/L   Glucose, Bld 112 (H) 70 - 99 mg/dL    Comment: Glucose reference range applies only to samples taken after  fasting for at least 8 hours.   BUN 10 8 - 23 mg/dL   Creatinine, Ser 0.83 0.61 - 1.24 mg/dL   Calcium 7.8 (L) 8.9 - 10.3 mg/dL   GFR calc non Af Amer >60 >60 mL/min   GFR calc Af Amer >60 >60 mL/min   Anion gap 6 5 - 15    Comment: Performed at [facility], [address] [place], [place] [address]  Basic metabolic panel     Status: Abnormal   Collection Time: 08/19/19 10:21 PM  Result Value Ref Range   Sodium 127 (L) 135 - 145 mmol/L   Potassium 4.0 3.5 - 5.1 mmol/L   Chloride 95 (L) 98 - 111 mmol/L   CO2 27 22 - 32 mmol/L   Glucose, Bld 99 70 - 99 mg/dL    Comment: Glucose reference range applies only to samples taken after fasting for at least 8 hours.   BUN 12 8 - 23 mg/dL   Creatinine, Ser 0.75 0.61 - 1.24 mg/dL   Calcium 7.8 (L) 8.9 - 10.3 mg/dL   GFR calc non Af Amer >60 >60 mL/min   GFR calc Af Amer >60 >60 mL/min   Anion gap 5 5 - 15    Comment: Performed at [facility], [address], [place], [place] [address]  Magnesium     Status: None   Collection Time: 08/20/19  6:28 AM  Result Value Ref Range   Magnesium 1.7 1.7 - 2.4 mg/dL    Comment: Performed at [facility], [address] [place], [place] [address]  Phosphorus     Status: None   Collection Time: 08/20/19  6:28 AM  Result Value Ref Range   Phosphorus 2.6 2.5 - 4.6 mg/dL    Comment: Performed at [facility], [address] [place], [place] [address]  Basic metabolic panel     Status: Abnormal   Collection Time: 08/20/19  6:28 AM  Result Value Ref Range   Sodium 129 (L) 135 - 145 mmol/L   Potassium 4.1 3.5 - 5.1 mmol/L   Chloride 96 (L) 98 - 111 mmol/L   CO2 27 22 - 32 mmol/L   Glucose, Bld 113 (H) 70 - 99 mg/dL    Comment: Glucose reference range applies only to samples taken after fasting for at least 8 hours.   BUN 9 8 - 23 mg/dL   Creatinine, Ser 0.64 0.61 - 1.24 mg/dL   Calcium 7.9 (L) 8.9 - 10.3 mg/dL   GFR calc non Af Amer >60 >60 mL/min   GFR calc  Af Amer >60 >60 mL/min   Anion gap 6 5 - 15    Comment: Performed at [facility], [address] [place], [place] [address]   CT CHEST WO CONTRAST  Result Date: 08/19/2019 CLINICAL DATA:  Inpatient. Status post left pleural chest tube placement. Follow-up left lower lobe lung mass. EXAM: CT CHEST WITHOUT CONTRAST TECHNIQUE: Multidetector CT imaging of the chest was performed following the standard protocol without IV contrast. COMPARISON:  08/17/2019 chest CT. 08/18/2019 chest radiograph. FINDINGS: Cardiovascular: Normal heart size. No significant pericardial effusion/thickening. Three-vessel coronary atherosclerosis. Atherosclerotic nonaneurysmal thoracic aorta. Normal caliber pulmonary arteries. Mediastinum/Nodes: No discrete thyroid  nodules. Unremarkable esophagus. No axillary adenopathy. Stable enlarged 1.0 cm left prevascular node (series 2/image 79) at the level of the pulmonic trunk. Stable enlarged 2.0 cm subcarinal node (series 2/image 79). Stable mild AP window adenopathy up to 1.0 cm (series 2/image 64). Stable enlarged 1.9 cm left infrahilar node (series 2/image 89). Stable enlarged 1.6 cm lower right paraesophageal node (series 2/image 128). No discrete right hilar adenopathy on these noncontrast images. Lungs/Pleura: No right pneumothorax. No right pleural effusion. Interval placement of left pleural pigtail drain terminating in the peripheral mid to lower left pleural space. Small residual left hydropneumothorax with minimal apical pneumothorax component. Irregular nodular dependent basilar left pleural thickening, unchanged. Poorly delineated 6.7 x 4.0 cm medial basilar left lower lobe lung mass (series 2/image 121). Moderate centrilobular and paraseptal emphysema. Lingular 1.0 cm solid pulmonary nodule (series 3/image 108). Interlobular septal thickening and patchy ground-glass opacity throughout lingula and left lower lobe with residual moderate atelectasis at the left lung base.  Upper abdomen: Enlarged 2.0 cm gastrohepatic ligament node (series 2/image 152). Enlarged 1.7 cm left para-aortic node (series 2/image 165). Musculoskeletal: No aggressive appearing focal osseous lesions. Mild thoracic spondylosis. IMPRESSION: 1. Interval placement of left pleural pigtail drain. Small residual left hydropneumothorax with minimal apical pneumothorax component. 2. Poorly delineated 6.7 x 4.0 cm medial basilar left lower lobe lung mass, highly suspicious for primary bronchogenic carcinoma. 3. Lingular 1.0 cm solid pulmonary nodule, suspicious for ipsilateral metastasis. 4. Interlobular septal thickening and patchy ground-glass opacity throughout the lingula and left lower lobe with residual moderate atelectasis at the left lung base. 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 5. Ipsilateral infrahilar, subcarinal, AP window, prevascular and upper retroperitoneal lymphadenopathy compatible with metastatic disease. PET-CT suggested for complete staging evaluation. 6. Three-vessel coronary atherosclerosis. 7. Aortic Atherosclerosis (ICD10-I70.0) and Emphysema (ICD10-J43.9). Electronically Signed   By: Ilona Sorrel M.D.   On: 08/19/2019 08:08   ECHOCARDIOGRAM COMPLETE  Result Date: 08/19/2019    ECHOCARDIOGRAM REPORT   Patient Name:   VINEET KINNEY Date of Exam: 08/19/2019 Medical Rec #:  [id number]    Height:       74.0 in Accession #:    [id number]   Weight:       138.7 lb Date of Birth:  [birth year]     BSA:          1.860 m Patient Age:    55 years     BP:           136/74 mmHg Patient Gender: M            HR:           97 bpm. Exam Location:  [facility] Procedure: 2D Echo, Color Doppler and Cardiac Doppler Indications:     Dyspnea 786.09  History:         Patient has prior history of Echocardiogram examinations, most                  recent 01/14/2019. Arrythmias:Atrial Fibrillation.  Sonographer:     Sherrie Sport RDCS (AE) Referring Phys:  [id number] [address] Diagnosing Phys: Kathlyn Sacramento MD   Sonographer Comments: No apical window. Large bandage in apical space. IMPRESSIONS  1. Left ventricular ejection fraction, by estimation, is 50 to 55%. The left ventricle has low normal function. Left ventricular endocardial border not optimally defined to evaluate regional wall motion. There is moderate left ventricular hypertrophy. Left ventricular diastolic function could not be evaluated.  2. Right ventricular systolic function is  normal. The right ventricular size is normal. Tricuspid regurgitation signal is inadequate for assessing PA pressure.  3. The mitral valve is normal in structure. No evidence of mitral valve regurgitation. No evidence of mitral stenosis.  4. The aortic valve is normal in structure. Aortic valve regurgitation is not visualized. Mild aortic valve sclerosis is present, with no evidence of aortic valve stenosis.  5. Very limited study due to lack of apical images. FINDINGS  Left Ventricle: Left ventricular ejection fraction, by estimation, is 50 to 55%. The left ventricle has low normal function. Left ventricular endocardial border not optimally defined to evaluate regional wall motion. The left ventricular internal cavity  size was normal in size. There is moderate left ventricular hypertrophy. Left ventricular diastolic function could not be evaluated. Right Ventricle: The right ventricular size is normal. No increase in right ventricular wall thickness. Right ventricular systolic function is normal. Tricuspid regurgitation signal is inadequate for assessing PA pressure. The tricuspid regurgitant velocity is 2.52 m/s, and with an assumed right atrial pressure of 10 mmHg, the estimated right ventricular systolic pressure is 16.1 mmHg. Left Atrium: Left atrial size was normal in size. Right Atrium: Right atrial size was normal in size. Pericardium: There is no evidence of pericardial effusion. Mitral Valve: The mitral valve is normal in structure. Normal mobility of the mitral valve  leaflets. No evidence of mitral valve regurgitation. No evidence of mitral valve stenosis. Tricuspid Valve: The tricuspid valve is normal in structure. Tricuspid valve regurgitation is trivial. No evidence of tricuspid stenosis. Aortic Valve: The aortic valve is normal in structure. Aortic valve regurgitation is not visualized. Mild aortic valve sclerosis is present, with no evidence of aortic valve stenosis. Pulmonic Valve: The pulmonic valve was normal in structure. Pulmonic valve regurgitation is not visualized. No evidence of pulmonic stenosis. Aorta: The aortic root is normal in size and structure. Venous: The inferior vena cava was not well visualized. IAS/Shunts: No atrial level shunt detected by color flow Doppler.  LEFT VENTRICLE PLAX 2D LVIDd:         3.12 cm LVIDs:         2.21 cm LV PW:         1.21 cm LV IVS:        1.38 cm LVOT diam:     2.00 cm LVOT Area:     3.14 cm  LEFT ATRIUM         Index LA diam:    3.40 cm 1.83 cm/m                        PULMONIC VALVE AORTA                 PV Vmax:        0.38 m/s Ao Root diam: 3.10 cm PV Peak grad:   0.6 mmHg                       RVOT Peak grad: 2 mmHg  TRICUSPID VALVE TR Peak grad:   25.4 mmHg TR Vmax:        252.00 cm/s  SHUNTS Systemic Diam: 2.00 cm Kathlyn Sacramento MD Electronically signed by Kathlyn Sacramento MD Signature Date/Time: 08/19/2019/12:17:53 PM    Final     Assessment:  The patient is a 69 y.o.  gentleman with a > 40 pack year smoking history of recurrent left sided pleural effusion.  He has undergone thoracentesis x 2 in the  past month.  He is s/p pigtail drain.  Initial cytology x 2 was negative (lymphocytic exudative effusion).  Current cytology reveals malignant cells- special stains pending.   Chest CT without contrast on 08/19/2019 revealed the left pleural pigtail drain.  There was a small residual left hydropneumothorax.  There was a 6.7 x 4 cm medial basilar left lower lobe lung mass suspicious for primary bronchogenic carcinoma.   There was a lingular 1.0 cm solid pulmonary nodule suspicious for ipsilateral metastasis.  There was intralobular septal thickening and patchy groundglass opacity throughout the lingula and the left lower lobe with moderate residual atelectasis of the left lung base.  A component of lymphangitic carcinomatosis was suspected.  There was ipsilateral infra hilar, subcarinal, AP window, prevascular and upper retroperitoneal adenopathy c/w metastatic disease.  He has hyponatremia suspicious for small cell lung cancer  He has a significant family history of malignancy.  A family member has had genetic testing (unknown + result).  Symptomatically, he is breathing better since the drain was placed.  Plan:   1.   Left lower lobe lung mass and recurrent left sided effusion  Imaging studies personally reviewed.  Agree with radiology interpretation.  Discuss concern for lung cancer with patient and his niece.  Clinical stage T3N2M1 (stage IV).  Discussed preliminary + cytology with patient.  Special stains will be available on 08/22/2019.  Anticipate head MRI to r/o CNS metastasis.  Anticipate PET scan as an outpatient or abdomen and pelvis CT to complete staging.  Discuss treatment based on type of lung cancer. 2.   Post-obstructive pneumonia  Patient currently on ceftriaxone and azithromycin. 3.   Hyponatremia  Sodium has improved from 120 to 129.  Etiology likely secondary to SIADH. 4.   Family history of malignancy  Niece will try to obtain family records of prior genetic testing.  Patient will likely undergo genetic testing as an outpatient. 5.   Disposition  Patient would like niece to become his medical power of attorney.  Thank you for allowing me to participate in DAILYN KEMPNER 's care.  I will follow him closely with you while hospitalized and after discharge in the outpatient department.   Lequita Asal, MD  08/20/2019, 9:32 PM

## 2019-08-21 ENCOUNTER — Inpatient Hospital Stay: Payer: Medicare Other

## 2019-08-21 LAB — BASIC METABOLIC PANEL
Anion gap: 4 — ABNORMAL LOW (ref 5–15)
BUN: 7 mg/dL — ABNORMAL LOW (ref 8–23)
CO2: 28 mmol/L (ref 22–32)
Calcium: 8 mg/dL — ABNORMAL LOW (ref 8.9–10.3)
Chloride: 97 mmol/L — ABNORMAL LOW (ref 98–111)
Creatinine, Ser: 0.67 mg/dL (ref 0.61–1.24)
GFR calc Af Amer: >60 mL/min (ref >60)
GFR calc non Af Amer: >60 mL/min (ref >60)
Glucose, Bld: 117 mg/dL — ABNORMAL HIGH (ref 70–99)
Potassium: 4.3 mmol/L (ref 3.5–5.1)
Sodium: 129 mmol/L — ABNORMAL LOW (ref 135–145)

## 2019-08-21 LAB — CBC
HCT: 34.2 % — ABNORMAL LOW (ref 39.0–52.0)
Hemoglobin: 11.9 g/dL — ABNORMAL LOW (ref 13.0–17.0)
MCH: 34.2 pg — ABNORMAL HIGH (ref 26.0–34.0)
MCHC: 34.8 g/dL (ref 30.0–36.0)
MCV: 98.3 fL (ref 80.0–100.0)
Platelets: 171 10*3/uL (ref 150–400)
RBC: 3.48 MIL/uL — ABNORMAL LOW (ref 4.22–5.81)
RDW: 11.9 % (ref 11.5–15.5)
WBC: 7.2 10*3/uL (ref 4.0–10.5)
nRBC: 0 % (ref 0.0–0.2)

## 2019-08-21 LAB — MAGNESIUM: Magnesium: 1.8 mg/dL (ref 1.7–2.4)

## 2019-08-21 LAB — PHOSPHORUS: Phosphorus: 2.9 mg/dL (ref 2.5–4.6)

## 2019-08-21 MED ORDER — LABETALOL HCL 5 MG/ML IV SOLN
10.0000 mg | Freq: Four times a day (QID) | INTRAVENOUS | Status: DC | PRN
  Administered 2019-08-22: 10 mg via INTRAVENOUS
  Filled 2019-08-21: qty 4

## 2019-08-21 MED ORDER — GADOBUTROL 1 MMOL/ML IV SOLN
6.0000 mL | Freq: Once | INTRAVENOUS | Status: AC | PRN
  Administered 2019-08-21: 6 mL via INTRAVENOUS

## 2019-08-21 MED ORDER — THIAMINE HCL 100 MG PO TABS
100.0000 mg | ORAL_TABLET | Freq: Every day | ORAL | Status: DC
  Administered 2019-08-22 – 2019-08-23 (×2): 100 mg via ORAL
  Filled 2019-08-21 (×2): qty 1

## 2019-08-21 MED ORDER — MAGNESIUM SULFATE 2 GM/50ML IV SOLN
2.0000 g | Freq: Once | INTRAVENOUS | Status: AC
  Administered 2019-08-21: 2 g via INTRAVENOUS
  Filled 2019-08-21: qty 50

## 2019-08-21 MED ORDER — IPRATROPIUM-ALBUTEROL 0.5-2.5 (3) MG/3ML IN SOLN: 3.0000 mL | Freq: Four times a day (QID) | RESPIRATORY_TRACT | Status: DC | PRN

## 2019-08-21 NOTE — Care Management Important Message (Signed)
Important Message  Patient Details  Name: Scott Jordan MRN: [id number] Date of Birth: Jun 10, 1950   Medicare Important Message Given:  Yes     Dannette Barbara 08/21/2019, 11:13 AM

## 2019-08-21 NOTE — Progress Notes (Signed)
   08/21/19 1000  Clinical Encounter Type  Visited With Patient  Visit Type Initial  Referral From Nurse  Consult/Referral To Chaplain  Chaplain visited with patient in response to a chat sent by a nurse, regarding an AD. Chaplain took patient an AD and he said his niece would need to get it notarized. Chaplain explained that it can be notarized here if we are able to find two witnesses. Chaplain left AD with patient. Patient later sent message to chaplain that his niece would be here at 62. Chaplain will try to locate two witnesses for that time.

## 2019-08-21 NOTE — Progress Notes (Signed)
PROGRESS NOTE    Scott Jordan  DTO:[id number] DOB: 11-10-1950 DOA: 08/17/2019 PCP: Patient, No Pcp Per   Brief Narrative:  Scott Jordan  is a 69 y.o. male with a known history of ongoing tobacco abuse, atrial fibrillation and substance abuse, who presented to the emergency room with acute onset of worsening dyspnea lately with associated dry cough and mild wheezing.  About a month ago he had thoracentesis of left pleural effusion and 2 weeks ago he had it again for recurrence. No reported fever or chills.  He has lost more than 20 pounds over the last 6 months.  Prior thoracentesis results were without any malignant cells.  CTA with a suspicious lung mass and multiple enlarged necrotic lymph nodes consistent with metastatic disease.  Hyponatremia consistent with SIADH.  Subjective: Pt reported dyspnea on exertion.  No fever, chest pain, abdominal pain, N/V/D, dysuria.  Chest tube continued to drain ~300 ml per day.   Assessment & Plan:   Active Problems:   Malignant pleural effusion   Hyponatremia   Dyspnea   Pleural effusion   Recurrent pleural effusion on left   Protein-calorie malnutrition, severe  Left lower lobe lung mass and recurrent left sided effusion --thoracic and abdominal necrotic lymphadenopathy concerning for metastatic disease.  Oncology consulted.   --Pulmonary was consulted-thoracentesis was done with placement of chest tube, more than 2 L removed. PLAN: --MRI brain today, per Onc rec. --Anticipate PET scan as an outpatient or abdomen and pelvis CT to complete staging. --Decision of pleurodesis vs placing a pleuravac per pulm eval.  Left basal pneumonia likely postobstructive. -Continue Rocephin and Zithromax for a total of 5 days -Continue mucolytic therapy and supportive care.  Hyponatremia.   Labs are more consistent with SIADH.  Consistent with para neoplastic syndrome. Patient received normal saline for the past 2 days due to concern of dehydration. --Hold IV  fluid as patient is eating and drinking   Chronic atrial fibrillation.  Currently rate well controlled. -Continue home dose of Cardizem. -Eliquis is being held due to upcoming procedures with chest tube and biopsy.  Hypertension.  Pressure mildly elevated. -Continue with Cozaar.  Alcohol use.  Patient drinks on a regular basis.  CIWA score remains 0. -Place him on CIWA protocol-we will add Ativan if needed.  Tobacco abuse.  Consulting was provided. -Do not want a nicotine patch at this time-can be ordered if needed.   Objective: Vitals:   08/20/19 1942 08/20/19 2041 08/21/19 0424 08/21/19 0844  BP:  (!) 146/95 130/87   Pulse:  64 91 (!) 102  Resp:  20 18 18   Temp:  98.1 F (36.7 C) 98.1 F (36.7 C)   TempSrc:  Oral    SpO2: 97% 97% 92% 92%  Weight:      Height:        Intake/Output Summary (Last 24 hours) at 08/21/2019 1832 Last data filed at 08/21/2019 1600 Gross per 24 hour  Intake 590 ml  Output 1900 ml  Net -1310 ml   Filed Weights   08/17/19 1916 08/18/19 1127  Weight: 64.4 kg 62.9 kg    Examination:  Constitutional: NAD, AAOx3 HEENT: conjunctivae and lids normal, EOMI CV: RRR no M,R,G. Distal pulses +2.  No cyanosis.   RESP: CTA B/L, normal respiratory effort  GI: +BS, NTND Extremities: No effusions, edema, or tenderness in BLE.  LLE larger than RLE which is baseline. SKIN: warm, dry and intact Neuro: II - XII grossly intact.  Sensation intact Psych: Normal  mood and affect.  Appropriate judgement and reason    DVT prophylaxis: Lovenox Code Status: Full Family Communication:  Disposition Plan: home Status is: Inpatient  Remains inpatient appropriate because:Inpatient level of care appropriate due to severity of illness   Dispo: The patient is from: Home              Anticipated d/c is to: Home              Anticipated d/c date is: 2 days              Patient currently is not medically stable to d/c.  Patient with metastatic lung disease, with  recurrent pleural effusion currently with a chest tube.  Pulm to decide on pleurodesis vs placing a pleuravac prior to discharge.   Consultants:   Pulmonary  IR  Oncology  Procedures:  Antimicrobials:  Rocephin Zithromax  Data Reviewed: I have personally reviewed following labs and imaging studies  CBC: Recent Labs  Lab 08/17/19 1921 08/18/19 0359 08/19/19 0622 08/21/19 0449  WBC 9.4 11.3* 8.4 7.2  NEUTROABS  --   --  6.2  --   HGB 15.1 14.5 12.9* 11.9*  HCT 41.3 40.6 37.2* 34.2*  MCV 94.9 96.9 99.5 98.3  PLT 197 181 158 170   Basic Metabolic Panel: Recent Labs  Lab 08/19/19 0622 08/19/19 1359 08/19/19 2221 08/20/19 0628 08/21/19 0449  NA 127* 127* 127* 129* 129*  K 4.2 3.9 4.0 4.1 4.3  CL 94* 94* 95* 96* 97*  CO2 27 27 27 27 28   GLUCOSE 116* 112* 99 113* 117*  BUN 11 10 12 9  7*  CREATININE 0.63 0.83 0.75 0.64 0.67  CALCIUM 8.0* 7.8* 7.8* 7.9* 8.0*  MG 1.7  --   --  1.7 1.8  PHOS 3.3  --   --  2.6 2.9   GFR: Estimated Creatinine Clearance: 79.7 mL/min (by C-G formula based on SCr of 0.67 mg/dL). Liver Function Tests: Recent Labs  Lab 08/17/19 1921  AST 33  ALT 21  ALKPHOS 87  BILITOT 0.8  PROT 7.9  ALBUMIN 3.8   No results for input(s): LIPASE, AMYLASE in the last 168 hours. No results for input(s): AMMONIA in the last 168 hours. Coagulation Profile: No results for input(s): INR, PROTIME in the last 168 hours. Cardiac Enzymes: No results for input(s): CKTOTAL, CKMB, CKMBINDEX, TROPONINI in the last 168 hours. BNP (last 3 results) No results for input(s): PROBNP in the last 8760 hours. HbA1C: No results for input(s): HGBA1C in the last 72 hours. CBG: No results for input(s): GLUCAP in the last 168 hours. Lipid Profile: No results for input(s): CHOL, HDL, LDLCALC, TRIG, CHOLHDL, LDLDIRECT in the last 72 hours. Thyroid Function Tests: No results for input(s): TSH, T4TOTAL, FREET4, T3FREE, THYROIDAB in the last 72 hours. Anemia Panel: No  results for input(s): VITAMINB12, FOLATE, FERRITIN, TIBC, IRON, RETICCTPCT in the last 72 hours. Sepsis Labs: No results for input(s): PROCALCITON, LATICACIDVEN in the last 168 hours.  Recent Results (from the past 240 hour(s))  Culture, sputum-assessment     Status: None   Collection Time: 08/17/19 10:59 PM   Specimen: Expectorated Sputum  Result Value Ref Range Status   Specimen Description EXPECTORATED SPUTUM  Final   Special Requests NONE  Final   Sputum evaluation   Final    Sputum specimen not acceptable for testing.  Please recollect.   NOTIFIED KATIE FERGUSON 08/18/19 AT 0240 HS Performed at [facility], [address],  [place], [place] [address]    Report Status 08/18/2019 FINAL  Final  Respiratory Panel by RT PCR (Flu A&B, Covid) - Nasopharyngeal Swab     Status: None   Collection Time: 08/18/19 12:06 AM   Specimen: Nasopharyngeal Swab  Result Value Ref Range Status   SARS Coronavirus 2 by RT PCR NEGATIVE NEGATIVE Final    Comment: (NOTE) SARS-CoV-2 target nucleic acids are NOT DETECTED. The SARS-CoV-2 RNA is generally detectable in upper respiratoy specimens during the acute phase of infection. The lowest concentration of SARS-CoV-2 viral copies this assay can detect is 131 copies/mL. A negative result does not preclude SARS-Cov-2 infection and should not be used as the sole basis for treatment or other patient management decisions. A negative result may occur with  improper specimen collection/handling, submission of specimen other than nasopharyngeal swab, presence of viral mutation(s) within the areas targeted by this assay, and inadequate number of viral copies (<131 copies/mL). A negative result must be combined with clinical observations, patient history, and epidemiological information. The expected result is Negative. Fact Sheet for Patients:  PinkCheek.be Fact Sheet for Healthcare Providers:   GravelBags.it This test is not yet ap proved or cleared by the [place] FDA and  has been authorized for detection and/or diagnosis of SARS-CoV-2 by FDA under an Emergency Use Authorization (EUA). This EUA will remain  in effect (meaning this test can be used) for the duration of the COVID-19 declaration under Section 564(b)(1) of the Act, 21 U.S.C. section 360bbb-3(b)(1), unless the authorization is terminated or revoked sooner.    Influenza A by PCR NEGATIVE NEGATIVE Final   Influenza B by PCR NEGATIVE NEGATIVE Final    Comment: (NOTE) The Xpert Xpress SARS-CoV-2/FLU/RSV assay is intended as an aid in  the diagnosis of influenza from Nasopharyngeal swab specimens and  should not be used as a sole basis for treatment. Nasal washings and  aspirates are unacceptable for Xpert Xpress SARS-CoV-2/FLU/RSV  testing. Fact Sheet for Patients: PinkCheek.be Fact Sheet for Healthcare Providers: GravelBags.it This test is not yet approved or cleared by the [place] FDA and  has been authorized for detection and/or diagnosis of SARS-CoV-2 by  FDA under an Emergency Use Authorization (EUA). This EUA will remain  in effect (meaning this test can be used) for the duration of the  Covid-19 declaration under Section 564(b)(1) of the Act, 21  U.S.C. section 360bbb-3(b)(1), unless the authorization is  terminated or revoked. Performed at [facility], [address] [place], [place] [address]   MRSA PCR Screening     Status: None   Collection Time: 08/18/19 11:32 AM   Specimen: Nasopharyngeal  Result Value Ref Range Status   MRSA by PCR NEGATIVE NEGATIVE Final    Comment:        The GeneXpert MRSA Assay (FDA approved for NASAL specimens only), is one component of a comprehensive MRSA colonization surveillance program. It is not intended to diagnose MRSA infection nor to guide or monitor  treatment for MRSA infections. Performed at [facility], [address], [place], [place] [address]      Radiology Studies: MR BRAIN W WO CONTRAST  Result Date: 08/21/2019 CLINICAL DATA:  Cancer of unknown primary, staging. EXAM: MRI HEAD WITHOUT AND WITH CONTRAST TECHNIQUE: Multiplanar, multiecho pulse sequences of the brain and surrounding structures were obtained without and with intravenous contrast. CONTRAST:  75mL GADAVIST GADOBUTROL 1 MMOL/ML IV SOLN COMPARISON:  No pertinent prior studies available for comparison. FINDINGS: Brain: Mild intermittent motion degradation.  Moderate scattered T2/FLAIR hyperintensity within the cerebral white matter is nonspecific, but consistent with chronic small vessel ischemic disease. Chronic lacunar infarcts within the left corona radiata and left thalamus. Cerebral volume is normal. No abnormal intracranial enhancement is demonstrated to suggest intracranial metastatic disease. There is no acute infarct. No evidence of intracranial mass. No chronic intracranial blood products. No extra-axial fluid collection. No midline shift. Vascular: Expected proximal arterial flow voids. Skull and upper cervical spine: No focal marrow lesion. Sinuses/Orbits: Visualized orbits show no acute finding. Small left maxillary sinus mucous retention cyst. Mild ethmoid sinus mucosal thickening. Trace right mastoid effusion. IMPRESSION: 1. Mildly motion degraded examination. 2. No evidence of intracranial metastatic disease. 3. Moderate chronic small vessel ischemic changes within the cerebral white matter. Chronic lacunar infarcts within the left corona radiata and left thalamus. 4. Mild ethmoid sinus mucosal thickening. Small left maxillary sinus mucous retention cysts. 5. Trace right mastoid effusion Electronically Signed   By: Kellie Simmering DO   On: 08/21/2019 14:49    Scheduled Meds: . Chlorhexidine Gluconate Cloth  6 each Topical Daily  . diltiazem  240 mg Oral Daily   . feeding supplement (ENSURE ENLIVE)  237 mL Oral TID BM  . guaiFENesin  600 mg Oral BID  . losartan  25 mg Oral Daily  . mouth rinse  15 mL Mouth Rinse BID  . [START ON 08/22/2019] thiamine  100 mg Oral Daily   Continuous Infusions: . azithromycin Stopped (08/20/19 2358)  . cefTRIAXone (ROCEPHIN)  IV Stopped (08/20/19 2327)     LOS: 4 days    Enzo Bi, MD [facility]  If 7PM-7AM, please contact night-coverage [email or web address]  08/21/2019, 6:32 PM   This record has been created using Dragon voice recognition software. Errors have been sought and corrected,but may not always be located. Such creation errors do not reflect on the standard of care.

## 2019-08-21 NOTE — Progress Notes (Signed)
[facility] for Electrolyte Monitoring and Replacement   Recent Labs: Potassium (mmol/L)  Date Value  08/21/2019 4.3   Magnesium (mg/dL)  Date Value  08/21/2019 1.8   Calcium (mg/dL)  Date Value  08/21/2019 8.0 (L)   Albumin (g/dL)  Date Value  08/17/2019 3.8   Phosphorus (mg/dL)  Date Value  08/21/2019 2.9   Sodium (mmol/L)  Date Value  08/21/2019 129 (L)     Assessment: 69 year old male presented with SOB. Patient with recurrent left large pleural effusion with mass concerning for malignancy. Patient is malnourished and at risk for refeeding syndrome. Pharmacy consulted for electrolytes.  Goal of Therapy:  Electrolytes WNL, K ~ 4 and Mg ~ 2  Plan:  Mg: 1.8. Will replace with 2gm IV x 1 dose.  No additional electrolyte replacement needed at this time. Will follow up all electrolytes with morning labs and continue to replace as needed.  Pernell Dupre, PharmD, BCPS Clinical Pharmacist 08/21/2019 7:14 AM

## 2019-08-21 NOTE — Progress Notes (Signed)
Pulmonary Medicine   monary Medicine      Date: 08/21/2019,   MRN# [id number] Scott Jordan [birth year]     AdmissionWeight: 64.4 kg                 CurrentWeight: 62.9 kg      CHIEF COMPLAINT:   Left malignant pleural effusion   HISTORY OF PRESENT ILLNESS   See consult note and [place] notes (pulmonary). S/p two negative ( for malignancy) thoracentesis. Came in here to the hospital for sob and large left effusion, pig tail placed. Fluid bloody, + malignant cells. Final type pending. Presently less sob, worried and eager to know. Oncology has seen.     PAST MEDICAL HISTORY   Past Medical History:  Diagnosis Date  . A-fib ([place]) 01/10/2019   pt st this was dx by Dr. Ubaldo Glassing  . Substance abuse ([place])      SURGICAL HISTORY   Past Surgical History:  Procedure Laterality Date  . BACK SURGERY       FAMILY HISTORY   History reviewed. No pertinent family history.   SOCIAL HISTORY   Social History   Tobacco Use  . Smoking status: Current Every Day Smoker    Packs/day: 0.50    Types: Cigarettes  . Smokeless tobacco: Never Used  . Tobacco comment: pt st he cut back in order to breath better  Substance Use Topics  . Alcohol use: Yes  . Drug use: Yes    Comment: heroin     MEDICATIONS    Home Medication:    Current Medication:  Current Facility-Administered Medications:  .  acetaminophen (TYLENOL) tablet 650 mg, 650 mg, Oral, Q6H PRN **OR** acetaminophen (TYLENOL) suppository 650 mg, 650 mg, Rectal, Q6H PRN, Lorella Nimrod, MD .  azithromycin (ZITHROMAX) 500 mg in sodium chloride 0.9 % 250 mL IVPB, 500 mg, Intravenous, Q24H, Lorella Nimrod, MD, Stopped at 08/20/19 2358 .  cefTRIAXone (ROCEPHIN) 2 g in sodium chloride 0.9 % 100 mL IVPB, 2 g, Intravenous, Q24H, Lorella Nimrod, MD, Stopped at 08/20/19 2327 .  Chlorhexidine Gluconate Cloth 2 % PADS 6 each, 6 each, Topical, Daily, Tyler Pita, MD, 6 each at 08/21/19 0810 .  chlorpheniramine-HYDROcodone  (TUSSIONEX) 10-8 MG/5ML suspension 5 mL, 5 mL, Oral, Q12H PRN, Lorella Nimrod, MD .  diltiazem (CARDIZEM CD) 24 hr capsule 240 mg, 240 mg, Oral, Daily, Lorella Nimrod, MD, 240 mg at 08/21/19 0802 .  feeding supplement (ENSURE ENLIVE) (ENSURE ENLIVE) liquid 237 mL, 237 mL, Oral, TID BM, Amin, Sumayya, MD, 237 mL at 08/21/19 1441 .  fentaNYL (SUBLIMAZE) injection 50 mcg, 50 mcg, Intravenous, Q2H PRN, Tyler Pita, MD, 50 mcg at 08/21/19 1436 .  guaiFENesin (MUCINEX) 12 hr tablet 600 mg, 600 mg, Oral, BID, Lorella Nimrod, MD, 600 mg at 08/21/19 0802 .  ipratropium-albuterol (DUONEB) 0.5-2.5 (3) MG/3ML nebulizer solution 3 mL, 3 mL, Nebulization, Q6H PRN, Enzo Bi, MD .  losartan (COZAAR) tablet 25 mg, 25 mg, Oral, Daily, Lorella Nimrod, MD, 25 mg at 08/21/19 0802 .  magnesium hydroxide (MILK OF MAGNESIA) suspension 30 mL, 30 mL, Oral, Daily PRN, Lorella Nimrod, MD .  MEDLINE mouth rinse, 15 mL, Mouth Rinse, BID, Lorella Nimrod, MD, 15 mL at 08/21/19 0811 .  ondansetron (ZOFRAN) tablet 4 mg, 4 mg, Oral, Q6H PRN **OR** ondansetron (ZOFRAN) injection 4 mg, 4 mg, Intravenous, Q6H PRN, Lorella Nimrod, MD, 4 mg at 08/18/19 0522 .  [START ON 08/22/2019] thiamine tablet 100 mg, 100 mg,  Oral, Daily, Hallaji, Sheema M, RPH .  traMADol (ULTRAM) tablet 50 mg, 50 mg, Oral, Q6H PRN, Tyler Pita, MD, 50 mg at 08/21/19 1006 .  traZODone (DESYREL) tablet 25 mg, 25 mg, Oral, QHS PRN, Lorella Nimrod, MD, 25 mg at 08/20/19 2047    ALLERGIES   Patient has no known allergies.     REVIEW OF SYSTEMS    Review of Systems:  Gen:  Denies  fever, sweats, chills weigh loss  HEENT: Denies blurred vision, double vision, ear pain, eye pain, hearing loss, nose bleeds, sore throat Cardiac:  No dizziness, chest pain or heaviness, chest tightness,edema Resp:   Denies cough or sputum porduction, less shortness of breath, no wheezing, hemoptysis,  Gi: Denies swallowing difficulty, stomach pain, nausea or vomiting,  diarrhea, constipation, bowel incontinence Gu:  Denies bladder incontinence, burning urine Ext:   Denies Joint pain, stiffness or swelling Skin: Denies  skin rash, easy bruising or bleeding or hives Endoc:  Denies polyuria, polydipsia , polyphagia or weight change Psych:   Denies depression, insomnia or hallucinations   Other:  All other systems negative   VS: BP 130/87 (BP Location: Left Arm)   Pulse (!) 102   Temp 98.1 F (36.7 C)   Resp 18   Ht 6\' 2"  (1.88 m)   Wt 62.9 kg   SpO2 92%   BMI 17.80 kg/m      PHYSICAL EXAM    GENERAL:NAD, no fevers, chills, no weakness no fatigue HEAD: Normocephalic, atraumatic.  EYES: Pupils equal, round, reactive to light. Extraocular muscles intact. No scleral icterus.  MOUTH: Moist mucosal membrane. Dentition intact. No abscess noted.  EAR, NOSE, THROAT: Clear without exudates. No external lesions.  NECK: Supple. No thyromegaly. No nodules. No JVD.  PULMONARY: Diffuse coarse rhonchi right sided -wheezes, left chest tobe, draining bloody effusion.  CARDIOVASCULAR: S1 and S2. Regular rate and rhythm. No murmurs, rubs, or gallops. No edema. Pedal pulses 2+ bilaterally.  GASTROINTESTINAL: Soft, nontender, nondistended. No masses. Positive bowel sounds. No hepatosplenomegaly.  MUSCULOSKELETAL: No swelling, clubbing, or edema. Range of motion full in all extremities.  NEUROLOGIC: Cranial nerves II through XII are intact. No gross focal neurological deficits. Sensation intact. Reflexes intact.  SKIN: No ulceration, lesions, rashes, or cyanosis. Skin warm and dry. Turgor intact.  PSYCHIATRIC: Mood, affect within normal limits. The patient is awake, alert and oriented x 3. Insight, judgment intact.       IMAGING    DG Chest 1 View  Result Date: 08/02/2019 CLINICAL DATA:  Left pleural effusion, post thoracentesis EXAM: CHEST  1 VIEW COMPARISON:  07/22/2019 FINDINGS: No pneumothorax. Trace residual left effusion suspected. Patchy  consolidation/atelectasis at the left lung base as before. Right lung clear. Heart size and mediastinal contours are within normal limits. Aortic Atherosclerosis (ICD10-170.0). Visualized bones unremarkable. IMPRESSION: No pneumothorax post thoracentesis. Electronically Signed   By: Lucrezia Europe M.D.   On: 08/02/2019 15:04   DG Chest 2 View  Result Date: 08/17/2019 CLINICAL DATA:  Short of breath, recent diagnosis of pneumonia, recent thoracentesis EXAM: CHEST - 2 VIEW COMPARISON:  08/02/2019 FINDINGS: Frontal and lateral views of the chest demonstrate increased left pleural effusion and left basilar consolidation. Right chest is clear. No pneumothorax. Cardiac silhouette is obscured by the consolidation and effusion at the left lung base. No acute bony abnormalities. IMPRESSION: 1. Progressive left basilar consolidation and effusion. Electronically Signed   By: Randa Ngo M.D.   On: 08/17/2019 19:46   CT CHEST WO CONTRAST  Result Date: 08/19/2019 CLINICAL DATA:  Inpatient. Status post left pleural chest tube placement. Follow-up left lower lobe lung mass. EXAM: CT CHEST WITHOUT CONTRAST TECHNIQUE: Multidetector CT imaging of the chest was performed following the standard protocol without IV contrast. COMPARISON:  08/17/2019 chest CT. 08/18/2019 chest radiograph. FINDINGS: Cardiovascular: Normal heart size. No significant pericardial effusion/thickening. Three-vessel coronary atherosclerosis. Atherosclerotic nonaneurysmal thoracic aorta. Normal caliber pulmonary arteries. Mediastinum/Nodes: No discrete thyroid nodules. Unremarkable esophagus. No axillary adenopathy. Stable enlarged 1.0 cm left prevascular node (series 2/image 79) at the level of the pulmonic trunk. Stable enlarged 2.0 cm subcarinal node (series 2/image 79). Stable mild AP window adenopathy up to 1.0 cm (series 2/image 64). Stable enlarged 1.9 cm left infrahilar node (series 2/image 89). Stable enlarged 1.6 cm lower right paraesophageal node  (series 2/image 128). No discrete right hilar adenopathy on these noncontrast images. Lungs/Pleura: No right pneumothorax. No right pleural effusion. Interval placement of left pleural pigtail drain terminating in the peripheral mid to lower left pleural space. Small residual left hydropneumothorax with minimal apical pneumothorax component. Irregular nodular dependent basilar left pleural thickening, unchanged. Poorly delineated 6.7 x 4.0 cm medial basilar left lower lobe lung mass (series 2/image 121). Moderate centrilobular and paraseptal emphysema. Lingular 1.0 cm solid pulmonary nodule (series 3/image 108). Interlobular septal thickening and patchy ground-glass opacity throughout lingula and left lower lobe with residual moderate atelectasis at the left lung base. Upper abdomen: Enlarged 2.0 cm gastrohepatic ligament node (series 2/image 152). Enlarged 1.7 cm left para-aortic node (series 2/image 165). Musculoskeletal: No aggressive appearing focal osseous lesions. Mild thoracic spondylosis. IMPRESSION: 1. Interval placement of left pleural pigtail drain. Small residual left hydropneumothorax with minimal apical pneumothorax component. 2. Poorly delineated 6.7 x 4.0 cm medial basilar left lower lobe lung mass, highly suspicious for primary bronchogenic carcinoma. 3. Lingular 1.0 cm solid pulmonary nodule, suspicious for ipsilateral metastasis. 4. Interlobular septal thickening and patchy ground-glass opacity throughout the lingula and left lower lobe with residual moderate atelectasis at the left lung base. 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 5. Ipsilateral infrahilar, subcarinal, AP window, prevascular and upper retroperitoneal lymphadenopathy compatible with metastatic disease. PET-CT suggested for complete staging evaluation. 6. Three-vessel coronary atherosclerosis. 7. Aortic Atherosclerosis (ICD10-I70.0) and Emphysema (ICD10-J43.9). Electronically Signed   By: Ilona Sorrel M.D.   On:  08/19/2019 08:08   CT Chest W Contrast  Result Date: 08/17/2019 CLINICAL DATA:  Shortness of breath. EXAM: CT CHEST WITH CONTRAST TECHNIQUE: Multidetector CT imaging of the chest was performed during intravenous contrast administration. CONTRAST:  20mL OMNIPAQUE IOHEXOL 300 MG/ML  SOLN COMPARISON:  June 11, 2004 FINDINGS: Cardiovascular: There is mild calcification of the aortic arch. The pulmonary arteries are limited in evaluation secondary to suboptimal opacification with intravenous contrast. No intraluminal filling defects are identified. Normal heart size. No pericardial effusion. Marked severity coronary artery calcification is seen Mediastinum/Nodes: Multiple subcentimeter pretracheal lymph nodes are seen. Lungs/Pleura: There is mild emphysematous lung disease. Moderate to marked severity consolidation is seen throughout the left lower lobe. A 6.3 cm x 3.4 cm x 4.1 cm heterogeneous low-attenuation soft tissue mass is seen within the posteromedial aspect of the left lung base (axial CT image 144, CT series number 2/coronal re-formatted image 97, CT series number 5). There is a large left pleural effusion. No pneumothorax is identified. Upper Abdomen: Enlarged, necrotic lymph nodes are seen within the para-aortic and aortocaval regions. A 3.9 cm x 2.9 cm and 2.2 cm x 1.8 cm heterogeneous low-attenuation soft tissue masses are seen in  between the medial aspect of the inferior vena cava and distal aspect of the descending thoracic aorta (axial CT images 137 through 135, CT series number 2). A 1.9 cm x 1.6 cm cystic appearing areas seen within the anterior aspect of the mid right kidney. Musculoskeletal: No chest wall abnormality. No acute or significant osseous findings. IMPRESSION: 1. 6.3 cm x 3.4 cm x 4.1 cm heterogeneous left lower lobe lung mass, worrisome for malignancy. 2. Moderate to marked severity left lower lobe consolidation. 3. Large left pleural effusion. 4. Enlarged, necrotic para-aortic and  aortocaval lymph nodes within the chest and abdomen, consistent with metastatic disease. Aortic Atherosclerosis (ICD10-I70.0). Electronically Signed   By: Virgina Norfolk M.D.   On: 08/17/2019 21:14   MR BRAIN W WO CONTRAST  Result Date: 08/21/2019 CLINICAL DATA:  Cancer of unknown primary, staging. EXAM: MRI HEAD WITHOUT AND WITH CONTRAST TECHNIQUE: Multiplanar, multiecho pulse sequences of the brain and surrounding structures were obtained without and with intravenous contrast. CONTRAST:  52mL GADAVIST GADOBUTROL 1 MMOL/ML IV SOLN COMPARISON:  No pertinent prior studies available for comparison. FINDINGS: Brain: Mild intermittent motion degradation. Moderate scattered T2/FLAIR hyperintensity within the cerebral white matter is nonspecific, but consistent with chronic small vessel ischemic disease. Chronic lacunar infarcts within the left corona radiata and left thalamus. Cerebral volume is normal. No abnormal intracranial enhancement is demonstrated to suggest intracranial metastatic disease. There is no acute infarct. No evidence of intracranial mass. No chronic intracranial blood products. No extra-axial fluid collection. No midline shift. Vascular: Expected proximal arterial flow voids. Skull and upper cervical spine: No focal marrow lesion. Sinuses/Orbits: Visualized orbits show no acute finding. Small left maxillary sinus mucous retention cyst. Mild ethmoid sinus mucosal thickening. Trace right mastoid effusion. IMPRESSION: 1. Mildly motion degraded examination. 2. No evidence of intracranial metastatic disease. 3. Moderate chronic small vessel ischemic changes within the cerebral white matter. Chronic lacunar infarcts within the left corona radiata and left thalamus. 4. Mild ethmoid sinus mucosal thickening. Small left maxillary sinus mucous retention cysts. 5. Trace right mastoid effusion Electronically Signed   By: Kellie Simmering DO   On: 08/21/2019 14:49   DG Chest Port 1 View  Result Date:  08/18/2019 CLINICAL DATA:  Left-sided chest tube placement. EXAM: PORTABLE CHEST 1 VIEW COMPARISON:  Aug 17, 2019 FINDINGS: Since the prior study there is been interval placement of a left-sided chest tube. Its distal tip is seen overlying the lateral aspect of the mid left lung. There is a large, stable left pleural effusion. No pneumothorax is identified. The heart size and mediastinal contours are within normal limits. Degenerative changes seen within the mid to lower thoracic spine. IMPRESSION: 1. Interval placement of a left-sided chest tube. 2. Large, stable left pleural effusion. 3. No pneumothorax. Electronically Signed   By: Virgina Norfolk M.D.   On: 08/18/2019 16:36   ECHOCARDIOGRAM COMPLETE  Result Date: 08/19/2019    ECHOCARDIOGRAM REPORT   Patient Name:   ZOEY GILKESON Date of Exam: 08/19/2019 Medical Rec #:  [id number]    Height:       74.0 in Accession #:    [id number]   Weight:       138.7 lb Date of Birth:  Oct 06, 1950     BSA:          1.860 m Patient Age:    19 years     BP:           136/74 mmHg Patient Gender: M  HR:           97 bpm. Exam Location:  [facility] Procedure: 2D Echo, Color Doppler and Cardiac Doppler Indications:     Dyspnea 786.09  History:         Patient has prior history of Echocardiogram examinations, most                  recent 01/14/2019. Arrythmias:Atrial Fibrillation.  Sonographer:     Sherrie Sport RDCS (AE) Referring Phys:  [id number] Tyler Pita Diagnosing Phys: Kathlyn Sacramento MD  Sonographer Comments: No apical window. Large bandage in apical space. IMPRESSIONS  1. Left ventricular ejection fraction, by estimation, is 50 to 55%. The left ventricle has low normal function. Left ventricular endocardial border not optimally defined to evaluate regional wall motion. There is moderate left ventricular hypertrophy. Left ventricular diastolic function could not be evaluated.  2. Right ventricular systolic function is normal. The right ventricular size is normal. Tricuspid  regurgitation signal is inadequate for assessing PA pressure.  3. The mitral valve is normal in structure. No evidence of mitral valve regurgitation. No evidence of mitral stenosis.  4. The aortic valve is normal in structure. Aortic valve regurgitation is not visualized. Mild aortic valve sclerosis is present, with no evidence of aortic valve stenosis.  5. Very limited study due to lack of apical images. FINDINGS  Left Ventricle: Left ventricular ejection fraction, by estimation, is 50 to 55%. The left ventricle has low normal function. Left ventricular endocardial border not optimally defined to evaluate regional wall motion. The left ventricular internal cavity  size was normal in size. There is moderate left ventricular hypertrophy. Left ventricular diastolic function could not be evaluated. Right Ventricle: The right ventricular size is normal. No increase in right ventricular wall thickness. Right ventricular systolic function is normal. Tricuspid regurgitation signal is inadequate for assessing PA pressure. The tricuspid regurgitant velocity is 2.52 m/s, and with an assumed right atrial pressure of 10 mmHg, the estimated right ventricular systolic pressure is 76.2 mmHg. Left Atrium: Left atrial size was normal in size. Right Atrium: Right atrial size was normal in size. Pericardium: There is no evidence of pericardial effusion. Mitral Valve: The mitral valve is normal in structure. Normal mobility of the mitral valve leaflets. No evidence of mitral valve regurgitation. No evidence of mitral valve stenosis. Tricuspid Valve: The tricuspid valve is normal in structure. Tricuspid valve regurgitation is trivial. No evidence of tricuspid stenosis. Aortic Valve: The aortic valve is normal in structure. Aortic valve regurgitation is not visualized. Mild aortic valve sclerosis is present, with no evidence of aortic valve stenosis. Pulmonic Valve: The pulmonic valve was normal in structure. Pulmonic valve regurgitation  is not visualized. No evidence of pulmonic stenosis. Aorta: The aortic root is normal in size and structure. Venous: The inferior vena cava was not well visualized. IAS/Shunts: No atrial level shunt detected by color flow Doppler.  LEFT VENTRICLE PLAX 2D LVIDd:         3.12 cm LVIDs:         2.21 cm LV PW:         1.21 cm LV IVS:        1.38 cm LVOT diam:     2.00 cm LVOT Area:     3.14 cm  LEFT ATRIUM         Index LA diam:    3.40 cm 1.83 cm/m  PULMONIC VALVE AORTA                 PV Vmax:        0.38 m/s Ao Root diam: 3.10 cm PV Peak grad:   0.6 mmHg                       RVOT Peak grad: 2 mmHg  TRICUSPID VALVE TR Peak grad:   25.4 mmHg TR Vmax:        252.00 cm/s  SHUNTS Systemic Diam: 2.00 cm Kathlyn Sacramento MD Electronically signed by Kathlyn Sacramento MD Signature Date/Time: 08/19/2019/12:17:53 PM    Final    US THORACENTESIS ASP PLEURAL SPACE W/IMG GUIDE  Result Date: 08/02/2019 INDICATION: Patient with prior history of substance abuse, CHF, pneumonia, dyspnea, left pleural effusion; request received for diagnostic and therapeutic left thoracentesis. EXAM: ULTRASOUND GUIDED DIAGNOSTIC AND THERAPEUTIC LEFT THORACENTESIS MEDICATIONS: None COMPLICATIONS: None immediate. PROCEDURE: An ultrasound guided thoracentesis was thoroughly discussed with the patient and questions answered. The benefits, risks, alternatives and complications were also discussed. The patient understands and wishes to proceed with the procedure. Written consent was obtained. Ultrasound was performed to localize and mark an adequate pocket of fluid in the left chest. The area was then prepped and draped in the normal sterile fashion. 1% Lidocaine was used for local anesthesia. Under ultrasound guidance a 6 Fr Safe-T-Centesis catheter was introduced. Thoracentesis was performed. The catheter was removed and a dressing applied. FINDINGS: A total of approximately 900 cc of blood-tinged fluid was removed. Samples were sent  to the laboratory as requested by the clinical team. IMPRESSION: Successful ultrasound guided diagnostic and therapeutic left thoracentesis yielding 900 cc of pleural fluid. Follow-up chest x-ray revealed no pneumothorax. Read by: Rowe Robert, PA-C Electronically Signed   By: Lucrezia Europe M.D.   On: 08/02/2019 15:08      ASSESSMENT/PLAN   Here with copd, stable thus far on present regimen, left malignant effusion, metastatic dz, still draining.  -awaiting final tumor type -question base on prognosis and the rate of drainage will decide on whether we consider pleurodesis vs placing a pleuravac. Will see tomorrow     Thank you for allowing me to participate in the care of this patient.   Patient/Family are satisfied with care plan and all questions have been answered.  This document was prepared using Dragon voice recognition software and may include unintentional dictation errors.     Wallene Huh, M.D.  Division of [place]

## 2019-08-22 DIAGNOSIS — C3432 Malignant neoplasm of lower lobe, left bronchus or lung: Principal | ICD-10-CM

## 2019-08-22 LAB — MAGNESIUM: Magnesium: 1.9 mg/dL (ref 1.7–2.4)

## 2019-08-22 LAB — CBC
HCT: 34.8 % — ABNORMAL LOW (ref 39.0–52.0)
Hemoglobin: 12.4 g/dL — ABNORMAL LOW (ref 13.0–17.0)
MCH: 34.1 pg — ABNORMAL HIGH (ref 26.0–34.0)
MCHC: 35.6 g/dL (ref 30.0–36.0)
MCV: 95.6 fL (ref 80.0–100.0)
Platelets: 193 10*3/uL (ref 150–400)
RBC: 3.64 MIL/uL — ABNORMAL LOW (ref 4.22–5.81)
RDW: 11.9 % (ref 11.5–15.5)
WBC: 6.6 10*3/uL (ref 4.0–10.5)
nRBC: 0 % (ref 0.0–0.2)

## 2019-08-22 LAB — BASIC METABOLIC PANEL
Anion gap: 8 (ref 5–15)
BUN: 6 mg/dL — ABNORMAL LOW (ref 8–23)
CO2: 25 mmol/L (ref 22–32)
Calcium: 8 mg/dL — ABNORMAL LOW (ref 8.9–10.3)
Chloride: 99 mmol/L (ref 98–111)
Creatinine, Ser: 0.56 mg/dL — ABNORMAL LOW (ref 0.61–1.24)
GFR calc Af Amer: >60 mL/min (ref >60)
GFR calc non Af Amer: >60 mL/min (ref >60)
Glucose, Bld: 107 mg/dL — ABNORMAL HIGH (ref 70–99)
Potassium: 3.7 mmol/L (ref 3.5–5.1)
Sodium: 132 mmol/L — ABNORMAL LOW (ref 135–145)

## 2019-08-22 LAB — CYTOLOGY - NON PAP

## 2019-08-22 MED ORDER — POTASSIUM CHLORIDE CRYS ER 20 MEQ PO TBCR
40.0000 meq | EXTENDED_RELEASE_TABLET | ORAL | Status: AC
  Administered 2019-08-22 (×2): 40 meq via ORAL
  Filled 2019-08-22 (×2): qty 2

## 2019-08-22 MED ORDER — SENNOSIDES-DOCUSATE SODIUM 8.6-50 MG PO TABS
1.0000 | ORAL_TABLET | Freq: Two times a day (BID) | ORAL | Status: DC
  Administered 2019-08-22 – 2019-08-23 (×3): 1 via ORAL
  Filled 2019-08-22 (×3): qty 1

## 2019-08-22 MED ORDER — MAGNESIUM SULFATE 2 GM/50ML IV SOLN
2.0000 g | Freq: Once | INTRAVENOUS | Status: AC
  Administered 2019-08-22: 2 g via INTRAVENOUS
  Filled 2019-08-22: qty 50

## 2019-08-22 NOTE — Progress Notes (Signed)
Ch received referral from [place] to have pt.'s AD witnessed, notarized; pt. sitting up in bed when [facility] arrived w/necessary parties.  [place] facilitated document being finalized; made copies; added copy to chart and brought copy to MR for scanning.  After returning copies, Pt. shared he has recently received news that the fluid coming from his PEG tube contains signs of cancer; pt. awaits news re: nature of cancer.  'I hope I've caught it early' he shared.  No further needs expressed at this time.  CH remains available as needed.  CH entered pt.'s HCA into EPIC per AD scanned into [facility].  Pt. would not like life-prolonging treatment in any of the three scenarios listed in #1 on Living Will (incurable disease resulting in imminent death, likely permanent unconsciousness, advanced dementia) BUT does want to receive tube feeding in these situations (#2).  HCA given permission to make decisions that differ from Living Will.

## 2019-08-22 NOTE — Progress Notes (Signed)
PROGRESS NOTE    Scott Jordan  YJE:[id number] DOB: 1950/10/06 DOA: 08/17/2019 PCP: Patient, No Pcp Per   Brief Narrative:  Scott Jordan  is a 69 y.o. male with a known history of ongoing tobacco abuse, atrial fibrillation and substance abuse, who presented to the emergency room with acute onset of worsening dyspnea lately with associated dry cough and mild wheezing.  About a month ago he had thoracentesis of left pleural effusion and 2 weeks ago he had it again for recurrence. No reported fever or chills.  He has lost more than 20 pounds over the last 6 months.  Prior thoracentesis results were without any malignant cells.  CTA with a suspicious lung mass and multiple enlarged necrotic lymph nodes consistent with metastatic disease.  Hyponatremia consistent with SIADH.  Subjective: Pt felt anxious about his cancer diagnosis.  Trying to eat more, drinking Ensure.  No fever, abdominal pain, N/V/D, dysuria, increased swelling.   Assessment & Plan:   Active Problems:   Malignant pleural effusion   Hyponatremia   Dyspnea   Pleural effusion   Recurrent pleural effusion on left   Protein-calorie malnutrition, severe  # Left lower lobe lung mass with cytology confirming small cell carcinoma # recurrent malignant left sided effusion --thoracic and abdominal necrotic lymphadenopathy concerning for metastatic disease.  Oncology consulted.   --Pulmonary was consulted-thoracentesis was done with placement of chest tube, more than 2 L removed, cytology returned positive for small cell. --MRI brain without mets PLAN: --Anticipate PET scan as an outpatient or abdomen and pelvis CT to complete staging. --Management of malignant pleural effusion, per Onc and Pulm.  Left basal pneumonia likely postobstructive. -finished 5 days of Rocephin and Zithromax -Continue mucolytic therapy and supportive care.  Hyponatremia 2/2 SIADH Labs are more consistent with SIADH.  Consistent with para neoplastic  syndrome. Patient received normal saline for the past 2 days due to concern of dehydration. --Hold IV fluid as patient is eating and drinking   Chronic atrial fibrillation.  Currently rate well controlled. -Continue home dose of Cardizem. -Eliquis is being held due to upcoming procedures with chest tube and biopsy.  Hypertension.  Pressure mildly elevated. -Continue with Cozaar.  Alcohol use.  Patient drinks on a regular basis.  CIWA score remains 0. -Place him on CIWA protocol-we will add Ativan if needed.  Tobacco abuse.  Consulting was provided. -Do not want a nicotine patch at this time-can be ordered if needed.   Objective: Vitals:   08/21/19 2103 08/21/19 2107 08/22/19 0313 08/22/19 1144  BP: (!) 169/93 (!) 152/97 138/89 127/81  Pulse: 77 89 83 79  Resp:   20 16  Temp: 98.2 F (36.8 C)  97.9 F (36.6 C) 98.2 F (36.8 C)  TempSrc: Oral  Oral Oral  SpO2: 92%  93% 94%  Weight:      Height:        Intake/Output Summary (Last 24 hours) at 08/22/2019 1733 Last data filed at 08/22/2019 1700 Gross per 24 hour  Intake 840 ml  Output 2575 ml  Net -1735 ml   Filed Weights   08/17/19 1916 08/18/19 1127  Weight: 64.4 kg 62.9 kg    Examination:  Constitutional: NAD, AAOx3 HEENT: conjunctivae and lids normal, EOMI CV: RRR no M,R,G. Distal pulses +2.  No cyanosis.   RESP: CTA B/L, normal respiratory effort  GI: +BS, NTND Extremities: No effusions, edema, or tenderness in BLE.  LLE larger than RLE which is baseline. SKIN: warm, dry and intact Neuro:  II - XII grossly intact.  Sensation intact Psych: Normal mood and affect.  Appropriate judgement and reason    DVT prophylaxis: Lovenox Code Status: Full Family Communication:  Disposition Plan: home Status is: Inpatient  Remains inpatient appropriate because:Inpatient level of care appropriate due to severity of illness   Dispo: The patient is from: Home              Anticipated d/c is to: Home               Anticipated d/c date is: 1-2 days              Patient currently is not medically stable to d/c.  Patient with metastatic lung disease, with recurrent pleural effusion currently with a chest tube.  Pulm and Onc to decide on management of this malignant pleural effusion prior to discharge.   Consultants:   Pulmonary  IR  Oncology  Procedures:  Antimicrobials:  Rocephin Zithromax  Data Reviewed: I have personally reviewed following labs and imaging studies  CBC: Recent Labs  Lab 08/17/19 1921 08/18/19 0359 08/19/19 0622 08/21/19 0449 08/22/19 0542  WBC 9.4 11.3* 8.4 7.2 6.6  NEUTROABS  --   --  6.2  --   --   HGB 15.1 14.5 12.9* 11.9* 12.4*  HCT 41.3 40.6 37.2* 34.2* 34.8*  MCV 94.9 96.9 99.5 98.3 95.6  PLT 197 181 158 171 193   Basic Metabolic Panel: Recent Labs  Lab 08/19/19 0622 08/19/19 0622 08/19/19 1359 08/19/19 2221 08/20/19 0628 08/21/19 0449 08/22/19 0542  NA 127*   < > 127* 127* 129* 129* 132*  K 4.2   < > 3.9 4.0 4.1 4.3 3.7  CL 94*   < > 94* 95* 96* 97* 99  CO2 27   < > 27 27 27 28 25   GLUCOSE 116*   < > 112* 99 113* 117* 107*  BUN 11   < > 10 12 9  7* 6*  CREATININE 0.63   < > 0.83 0.75 0.64 0.67 0.56*  CALCIUM 8.0*   < > 7.8* 7.8* 7.9* 8.0* 8.0*  MG 1.7  --   --   --  1.7 1.8 1.9  PHOS 3.3  --   --   --  2.6 2.9  --    < > = values in this interval not displayed.   GFR: Estimated Creatinine Clearance: 79.7 mL/min (A) (by C-G formula based on SCr of 0.56 mg/dL (L)). Liver Function Tests: Recent Labs  Lab 08/17/19 1921  AST 33  ALT 21  ALKPHOS 87  BILITOT 0.8  PROT 7.9  ALBUMIN 3.8   No results for input(s): LIPASE, AMYLASE in the last 168 hours. No results for input(s): AMMONIA in the last 168 hours. Coagulation Profile: No results for input(s): INR, PROTIME in the last 168 hours. Cardiac Enzymes: No results for input(s): CKTOTAL, CKMB, CKMBINDEX, TROPONINI in the last 168 hours. BNP (last 3 results) No results for input(s): PROBNP  in the last 8760 hours. HbA1C: No results for input(s): HGBA1C in the last 72 hours. CBG: No results for input(s): GLUCAP in the last 168 hours. Lipid Profile: No results for input(s): CHOL, HDL, LDLCALC, TRIG, CHOLHDL, LDLDIRECT in the last 72 hours. Thyroid Function Tests: No results for input(s): TSH, T4TOTAL, FREET4, T3FREE, THYROIDAB in the last 72 hours. Anemia Panel: No results for input(s): VITAMINB12, FOLATE, FERRITIN, TIBC, IRON, RETICCTPCT in the last 72 hours. Sepsis Labs: No results for input(s): PROCALCITON, LATICACIDVEN in the  last 168 hours.  Recent Results (from the past 240 hour(s))  Culture, sputum-assessment     Status: None   Collection Time: 08/17/19 10:59 PM   Specimen: Expectorated Sputum  Result Value Ref Range Status   Specimen Description EXPECTORATED SPUTUM  Final   Special Requests NONE  Final   Sputum evaluation   Final    Sputum specimen not acceptable for testing.  Please recollect.   NOTIFIED Reyes Ivan 08/18/19 AT 0240 HS Performed at [facility], [address] [place], [place] [address]    Report Status 08/18/2019 FINAL  Final  Respiratory Panel by RT PCR (Flu A&B, Covid) - Nasopharyngeal Swab     Status: None   Collection Time: 08/18/19 12:06 AM   Specimen: Nasopharyngeal Swab  Result Value Ref Range Status   SARS Coronavirus 2 by RT PCR NEGATIVE NEGATIVE Final    Comment: (NOTE) SARS-CoV-2 target nucleic acids are NOT DETECTED. The SARS-CoV-2 RNA is generally detectable in upper respiratoy specimens during the acute phase of infection. The lowest concentration of SARS-CoV-2 viral copies this assay can detect is 131 copies/mL. A negative result does not preclude SARS-Cov-2 infection and should not be used as the sole basis for treatment or other patient management decisions. A negative result may occur with  improper specimen collection/handling, submission of specimen other than nasopharyngeal swab, presence of viral  mutation(s) within the areas targeted by this assay, and inadequate number of viral copies (<131 copies/mL). A negative result must be combined with clinical observations, patient history, and epidemiological information. The expected result is Negative. Fact Sheet for Patients:  PinkCheek.be Fact Sheet for Healthcare Providers:  GravelBags.it This test is not yet ap proved or cleared by the [place] FDA and  has been authorized for detection and/or diagnosis of SARS-CoV-2 by FDA under an Emergency Use Authorization (EUA). This EUA will remain  in effect (meaning this test can be used) for the duration of the COVID-19 declaration under Section 564(b)(1) of the Act, 21 U.S.C. section 360bbb-3(b)(1), unless the authorization is terminated or revoked sooner.    Influenza A by PCR NEGATIVE NEGATIVE Final   Influenza B by PCR NEGATIVE NEGATIVE Final    Comment: (NOTE) The Xpert Xpress SARS-CoV-2/FLU/RSV assay is intended as an aid in  the diagnosis of influenza from Nasopharyngeal swab specimens and  should not be used as a sole basis for treatment. Nasal washings and  aspirates are unacceptable for Xpert Xpress SARS-CoV-2/FLU/RSV  testing. Fact Sheet for Patients: PinkCheek.be Fact Sheet for Healthcare Providers: GravelBags.it This test is not yet approved or cleared by the [place] FDA and  has been authorized for detection and/or diagnosis of SARS-CoV-2 by  FDA under an Emergency Use Authorization (EUA). This EUA will remain  in effect (meaning this test can be used) for the duration of the  Covid-19 declaration under Section 564(b)(1) of the Act, 21  U.S.C. section 360bbb-3(b)(1), unless the authorization is  terminated or revoked. Performed at [facility], [address] [place],  [address]   MRSA PCR Screening     Status: None    Collection Time: 08/18/19 11:32 AM   Specimen: Nasopharyngeal  Result Value Ref Range Status   MRSA by PCR NEGATIVE NEGATIVE Final    Comment:        The GeneXpert MRSA Assay (FDA approved for NASAL specimens only), is one component of a comprehensive MRSA colonization surveillance program. It is not intended to diagnose MRSA infection nor to guide or  monitor treatment for MRSA infections. Performed at [facility], [address], [place], [place] [address]      Radiology Studies: MR BRAIN W WO CONTRAST  Result Date: 08/21/2019 CLINICAL DATA:  Cancer of unknown primary, staging. EXAM: MRI HEAD WITHOUT AND WITH CONTRAST TECHNIQUE: Multiplanar, multiecho pulse sequences of the brain and surrounding structures were obtained without and with intravenous contrast. CONTRAST:  75mL GADAVIST GADOBUTROL 1 MMOL/ML IV SOLN COMPARISON:  No pertinent prior studies available for comparison. FINDINGS: Brain: Mild intermittent motion degradation. Moderate scattered T2/FLAIR hyperintensity within the cerebral white matter is nonspecific, but consistent with chronic small vessel ischemic disease. Chronic lacunar infarcts within the left corona radiata and left thalamus. Cerebral volume is normal. No abnormal intracranial enhancement is demonstrated to suggest intracranial metastatic disease. There is no acute infarct. No evidence of intracranial mass. No chronic intracranial blood products. No extra-axial fluid collection. No midline shift. Vascular: Expected proximal arterial flow voids. Skull and upper cervical spine: No focal marrow lesion. Sinuses/Orbits: Visualized orbits show no acute finding. Small left maxillary sinus mucous retention cyst. Mild ethmoid sinus mucosal thickening. Trace right mastoid effusion. IMPRESSION: 1. Mildly motion degraded examination. 2. No evidence of intracranial metastatic disease. 3. Moderate chronic small vessel ischemic changes within the cerebral white matter.  Chronic lacunar infarcts within the left corona radiata and left thalamus. 4. Mild ethmoid sinus mucosal thickening. Small left maxillary sinus mucous retention cysts. 5. Trace right mastoid effusion Electronically Signed   By: Kellie Simmering DO   On: 08/21/2019 14:49    Scheduled Meds: . Chlorhexidine Gluconate Cloth  6 each Topical Daily  . diltiazem  240 mg Oral Daily  . feeding supplement (ENSURE ENLIVE)  237 mL Oral TID BM  . guaiFENesin  600 mg Oral BID  . losartan  25 mg Oral Daily  . mouth rinse  15 mL Mouth Rinse BID  . senna-docusate  1 tablet Oral BID  . thiamine  100 mg Oral Daily   Continuous Infusions:    LOS: 5 days    Enzo Bi, MD [facility]  If 7PM-7AM, please contact night-coverage [email or web address]  08/22/2019, 5:33 PM   This record has been created using Systems analyst. Errors have been sought and corrected,but may not always be located. Such creation errors do not reflect on the standard of care.

## 2019-08-22 NOTE — Consult Note (Signed)
Patient ID: Scott Jordan, male   DOB: 1950/12/13, 69 y.o.   MRN: [id number]  Chief Complaint  Patient presents with  . Shortness of Breath    Referred By Dr. Claudette Stapler Reason for Referral management of left malignant pleural effusion  HPI Location, Quality, Duration, Severity, Timing, Context, Modifying Factors, Associated Signs and Symptoms.  Scott Jordan is a 69 y.o. male.  His problems began several weeks ago when he began experiencing increasing shortness of breath.  He was ultimately brought into our institution where a percutaneous drain was placed into a large left pleural effusion.  He has had a CT scan of the chest performed subsequently which revealed a left lower lobe mass but minimal pleural fluid.  According to the notes the fluid was positive for a malignancy and he is currently being evaluated by oncology for considerations of therapy.  At the present time there is no final pathology report available for review.  The patient states that he does live alone.  He has limited social support to assist him.  He states that he does feel significantly better since the drain has been placed.   Past Medical History:  Diagnosis Date  . A-fib ([place]) 01/10/2019   pt st this was dx by Dr. Ubaldo Glassing  . Substance abuse [facility])     Past Surgical History:  Procedure Laterality Date  . BACK SURGERY      History reviewed. No pertinent family history.  Social History Social History   Tobacco Use  . Smoking status: Current Every Day Smoker    Packs/day: 0.50    Types: Cigarettes  . Smokeless tobacco: Never Used  . Tobacco comment: pt st he cut back in order to breath better  Substance Use Topics  . Alcohol use: Yes  . Drug use: Yes    Comment: heroin    No Known Allergies  Current Facility-Administered Medications  Medication Dose Route Frequency Provider Last Rate Last Admin  . acetaminophen (TYLENOL) tablet 650 mg  650 mg Oral Q6H PRN Lorella Nimrod, MD       Or  .  acetaminophen (TYLENOL) suppository 650 mg  650 mg Rectal Q6H PRN Lorella Nimrod, MD      . Chlorhexidine Gluconate Cloth 2 % PADS 6 each  6 each Topical Daily Tyler Pita, MD   6 each at 08/22/19 [phone]  . chlorpheniramine-HYDROcodone (TUSSIONEX) 10-8 MG/5ML suspension 5 mL  5 mL Oral Q12H PRN Lorella Nimrod, MD      . diltiazem (CARDIZEM CD) 24 hr capsule 240 mg  240 mg Oral Daily Lorella Nimrod, MD   240 mg at 08/22/19 0843  . feeding supplement (ENSURE ENLIVE) (ENSURE ENLIVE) liquid 237 mL  237 mL Oral TID BM Lorella Nimrod, MD   237 mL at 08/22/19 0851  . fentaNYL (SUBLIMAZE) injection 50 mcg  50 mcg Intravenous Q2H PRN Tyler Pita, MD   50 mcg at 08/22/19 0845  . guaiFENesin (MUCINEX) 12 hr tablet 600 mg  600 mg Oral BID Lorella Nimrod, MD   600 mg at 08/22/19 0844  . ipratropium-albuterol (DUONEB) 0.5-2.5 (3) MG/3ML nebulizer solution 3 mL  3 mL Nebulization Q6H PRN Enzo Bi, MD      . labetalol (NORMODYNE) injection 10 mg  10 mg Intravenous Q6H PRN Lang Snow, NP      . losartan (COZAAR) tablet 25 mg  25 mg Oral Daily Lorella Nimrod, MD   25 mg at 08/22/19 0844  . magnesium hydroxide (  MILK OF MAGNESIA) suspension 30 mL  30 mL Oral Daily PRN Lorella Nimrod, MD      . magnesium sulfate IVPB 2 g 50 mL  2 g Intravenous Once Rito Ehrlich A, RPH      . MEDLINE mouth rinse  15 mL Mouth Rinse BID Lorella Nimrod, MD   15 mL at 08/22/19 0851  . ondansetron (ZOFRAN) tablet 4 mg  4 mg Oral Q6H PRN Lorella Nimrod, MD       Or  . ondansetron (ZOFRAN) injection 4 mg  4 mg Intravenous Q6H PRN Lorella Nimrod, MD   4 mg at 08/18/19 0522  . potassium chloride SA (KLOR-CON) CR tablet 40 mEq  40 mEq Oral Q4H Nazari, Walid A, RPH      . senna-docusate (Senokot-S) tablet 1 tablet  1 tablet Oral BID Enzo Bi, MD      . thiamine tablet 100 mg  100 mg Oral Daily Pernell Dupre, RPH   100 mg at 08/22/19 0844  . traMADol (ULTRAM) tablet 50 mg  50 mg Oral Q6H PRN Tyler Pita, MD   50 mg at  08/22/19 0844  . traZODone (DESYREL) tablet 25 mg  25 mg Oral QHS PRN Lorella Nimrod, MD   25 mg at 08/21/19 2050      Review of Systems A complete review of systems was asked and was negative except for the following positive findings shortness of breath which has improved with chest tube insertion  Blood pressure 138/89, pulse 83, temperature 97.9 F (36.6 C), temperature source Oral, resp. rate 20, height 6\' 2"  (1.88 m), weight 62.9 kg, SpO2 93 %.  Physical Exam CONSTITUTIONAL:  Pleasant, well-developed, well-nourished, and in no acute distress. EYES: Pupils equal and reactive to light, Sclera non-icteric EARS, NOSE, MOUTH AND THROAT:  The oropharynx was clear..  Oral mucosa pink and moist. LYMPH NODES:  Lymph nodes in the neck and axillae were normal RESPIRATORY:  Lungs were diminished on the left.  Normal respiratory effort without pathologic use of accessory muscles of respiration CARDIOVASCULAR: Heart was regular without murmurs.  There were no carotid bruits. GI: The abdomen was soft, nontender, and slightly distended. There were no palpable masses. There was no hepatosplenomegaly. There were normal bowel sounds in all quadrants. GU:  Rectal deferred.   MUSCULOSKELETAL:  Normal muscle strength and tone.  No clubbing or cyanosis.   SKIN:  There were no pathologic skin lesions.  There were no nodules on palpation. NEUROLOGIC:  Sensation is normal.  Cranial nerves are grossly intact. PSYCH:  Oriented to person, place and time.  Mood and affect are normal.  Data Reviewed Chest x-rays and CT scans  I have personally reviewed the patient's imaging, laboratory findings and medical records.    Assessment    At the present time I do not appreciate an air leak from the tube.  There was 275 cc of drainage yesterday.  Final pathology is still pending     Plan    I had a opportunity to review with the patient some of the management options for his malignant pleural effusion (presumed  awaiting final pathology).  These may include thoracoscopic talc insufflation; doxycycline pleurodesis through the existing chest tube; insertion of a permanent Pleurx catheter.  An intraoperative procedure may also be accompanied by Port-A-Cath insertion if needed.  At the present time the patient would like to wait until Dr. Mike Gip and her team discussed with him the final pathology and he can make decisions as  to how he would like to proceed.  We did discuss the fact that he lives alone and that he may find it difficult to manage all aspects of catheter care by himself.       Nestor Lewandowsky, MD 08/22/2019, 9:25 AM

## 2019-08-22 NOTE — ACP (Advance Care Planning) (Signed)
[facility] entered pt.'s HCA into EPIC per AD scanned into [facility].  Pt. would not like life-prolonging treatment in any of the three scenarios listed in #1 on Living Will (incurable disease resulting in imminent death, likely permanent unconsciousness, advanced dementia) BUT does want to receive tube feeding in these situations (#2).  HCA given permission to make decisions that differ from Living Will.

## 2019-08-22 NOTE — Progress Notes (Signed)
[place] for Electrolyte Monitoring and Replacement   Recent Labs: Potassium (mmol/L)  Date Value  08/22/2019 3.7   Magnesium (mg/dL)  Date Value  08/22/2019 1.9   Calcium (mg/dL)  Date Value  08/22/2019 8.0 (L)   Albumin (g/dL)  Date Value  08/17/2019 3.8   Phosphorus (mg/dL)  Date Value  08/21/2019 2.9   Sodium (mmol/L)  Date Value  08/22/2019 132 (L)     Assessment: 69 year old male presented with SOB. Patient with recurrent left large pleural effusion with mass concerning for malignancy. Patient is malnourished and at risk for refeeding syndrome. Pharmacy consulted for electrolytes.  Goal of Therapy:  Electrolytes WNL, K ~ 4 and Mg ~ 2  Plan:  KK 3.7, Mg: 1.9. Will replace with KCL 38mEq PO x 1 dose and Magnesium Sulfate 2gm IV x 1 dose due to patient being at risk of refeeding. No additional electrolyte replacement needed at this time. Will follow up all electrolytes with morning labs and continue to replace as needed.  Pearla Dubonnet, PharmD Clinical Pharmacist 08/22/2019 9:12 AM

## 2019-08-22 NOTE — Progress Notes (Signed)
[facility] Hematology/Oncology Progress Note  Date of admission: 08/17/2019  Hospital day:  08/22/2019  Chief Complaint: Scott Jordan is a 69 y.o. male with a smoking history and recurrent left sided pleural effusion who was admitted with a post-obstructive pneumonia and hyponatremia.  Subjective:  The patient mild chronic shortness of breath.  Social History: The patient is alone today.  Allergies: No Known Allergies  Scheduled Medications: . Chlorhexidine Gluconate Cloth  6 each Topical Daily  . diltiazem  240 mg Oral Daily  . feeding supplement (ENSURE ENLIVE)  237 mL Oral TID BM  . guaiFENesin  600 mg Oral BID  . losartan  25 mg Oral Daily  . mouth rinse  15 mL Mouth Rinse BID  . senna-docusate  1 tablet Oral BID  . thiamine  100 mg Oral Daily    Review of Systems: GENERAL:  Feels "ok".  No fevers, sweats .  Weight loss of 20 pounds in the past 6 months. PERFORMANCE STATUS (ECOG):  2 HEENT:  No visual changes, runny nose, sore throat, mouth sores or tenderness. Lungs: Shortness of breath, slightly improved s/p pigtail drain.  Dry cough.  No hemoptysis. Cardiac:  No chest pain, palpitations, orthopnea, or PND. GI:  Appetite is fair.  No nausea, vomiting, diarrhea, constipation, melena or hematochezia. GU:  No urgency, frequency, dysuria, or hematuria. Musculoskeletal:  No back pain.  No joint pain.  No muscle tenderness. Extremities:  No pain or swelling. Skin:  No rashes or skin changes. Neuro:  No headache, numbness or weakness, balance or coordination issues. Endocrine:  No diabetes, thyroid issues, hot flashes or night sweats. Psych:  No mood changes, depression or anxiety. Pain:  No focal pain. Review of systems:  All other systems reviewed and found to be negative.  Physical Exam: Blood pressure (!) 141/103, pulse 74, temperature 98.1 F (36.7 C), temperature source Oral, resp. rate 18, height 6' 2"  (1.88 m), weight 138 lb 10.7 oz (62.9 kg),  SpO2 95 %.  GENERAL:  Thin gentleman sitting comfortably on the medical unit in no acute distress. MENTAL STATUS:  Alert and oriented to person, place and time. HEAD:  Thick hair.  Graying beard. Normocephalic, atraumatic, face symmetric, no Cushingoid features. EYES:  Blue eyes.  Pupils equal round and reactive to light and accomodation.  No conjunctivitis or scleral icterus. ENT:  Oropharynx clear without lesion.  Tongue normal. Mucous membranes moist.  RESPIRATORY:  Decreased breath sounds left base.  Coarse breath sounds.  No wheezes. CARDIOVASCULAR:  Regular rate and rhythm without murmur, rub or gallop. ABDOMEN:  Soft, non-tender, with active bowel sounds, and no hepatosplenomegaly.  No masses. SKIN:  No rashes, ulcers or lesions. EXTREMITIES: No edema, no skin discoloration or tenderness.  No palpable cords. NEUROLOGICAL: Unremarkable. PSYCH:  Appropriate.   Results for orders placed or performed during the hospital encounter of 08/17/19 (from the past 48 hour(s))  Basic metabolic panel     Status: Abnormal   Collection Time: 08/21/19  4:49 AM  Result Value Ref Range   Sodium 129 (L) 135 - 145 mmol/L   Potassium 4.3 3.5 - 5.1 mmol/L   Chloride 97 (L) 98 - 111 mmol/L   CO2 28 22 - 32 mmol/L   Glucose, Bld 117 (H) 70 - 99 mg/dL    Comment: Glucose reference range applies only to samples taken after fasting for at least 8 hours.   BUN 7 (L) 8 - 23 mg/dL   Creatinine, Ser 0.67 0.61 -  1.24 mg/dL   Calcium 8.0 (L) 8.9 - 10.3 mg/dL   GFR calc non Af Amer >60 >60 mL/min   GFR calc Af Amer >60 >60 mL/min   Anion gap 4 (L) 5 - 15    Comment: Performed at [facility], [address] [place], [place] [address]  CBC     Status: Abnormal   Collection Time: 08/21/19  4:49 AM  Result Value Ref Range   WBC 7.2 4.0 - 10.5 K/uL   RBC 3.48 (L) 4.22 - 5.81 MIL/uL   Hemoglobin 11.9 (L) 13.0 - 17.0 g/dL   HCT 34.2 (L) 39.0 - 52.0 %   MCV 98.3 80.0 - 100.0 fL   MCH 34.2 (H) 26.0 -  34.0 pg   MCHC 34.8 30.0 - 36.0 g/dL   RDW 11.9 11.5 - 15.5 %   Platelets 171 150 - 400 K/uL   nRBC 0.0 0.0 - 0.2 %    Comment: Performed at [facility], [address], [place], [place] [address]  Magnesium     Status: None   Collection Time: 08/21/19  4:49 AM  Result Value Ref Range   Magnesium 1.8 1.7 - 2.4 mg/dL    Comment: Performed at [facility], [address] [place], [place] [address]  Phosphorus     Status: None   Collection Time: 08/21/19  4:49 AM  Result Value Ref Range   Phosphorus 2.9 2.5 - 4.6 mg/dL    Comment: Performed at [facility], [address] [place], [place] [address]  Basic metabolic panel     Status: Abnormal   Collection Time: 08/22/19  5:42 AM  Result Value Ref Range   Sodium 132 (L) 135 - 145 mmol/L   Potassium 3.7 3.5 - 5.1 mmol/L   Chloride 99 98 - 111 mmol/L   CO2 25 22 - 32 mmol/L   Glucose, Bld 107 (H) 70 - 99 mg/dL    Comment: Glucose reference range applies only to samples taken after fasting for at least 8 hours.   BUN 6 (L) 8 - 23 mg/dL   Creatinine, Ser 0.56 (L) 0.61 - 1.24 mg/dL   Calcium 8.0 (L) 8.9 - 10.3 mg/dL   GFR calc non Af Amer >60 >60 mL/min   GFR calc Af Amer >60 >60 mL/min   Anion gap 8 5 - 15    Comment: Performed at [facility], [address] [place], [place] [address]  Magnesium     Status: None   Collection Time: 08/22/19  5:42 AM  Result Value Ref Range   Magnesium 1.9 1.7 - 2.4 mg/dL    Comment: Performed at [facility], [address] [place], [place] [address]  CBC     Status: Abnormal   Collection Time: 08/22/19  5:42 AM  Result Value Ref Range   WBC 6.6 4.0 - 10.5 K/uL   RBC 3.64 (L) 4.22 - 5.81 MIL/uL   Hemoglobin 12.4 (L) 13.0 - 17.0 g/dL   HCT 34.8 (L) 39.0 - 52.0 %   MCV 95.6 80.0 - 100.0 fL   MCH 34.1 (H) 26.0 - 34.0 pg   MCHC 35.6 30.0 - 36.0 g/dL   RDW 11.9 11.5 - 15.5 %   Platelets 193 150 - 400 K/uL   nRBC 0.0 0.0 - 0.2 %    Comment:  Performed at [facility], [address] [place], [place] [address]   MR BRAIN W WO CONTRAST  Result Date:  08/21/2019 CLINICAL DATA:  Cancer of unknown primary, staging. EXAM: MRI HEAD WITHOUT AND WITH CONTRAST TECHNIQUE: Multiplanar, multiecho pulse sequences of the brain and surrounding structures were obtained without and with intravenous contrast. CONTRAST:  69m GADAVIST GADOBUTROL 1 MMOL/ML IV SOLN COMPARISON:  No pertinent prior studies available for comparison. FINDINGS: Brain: Mild intermittent motion degradation. Moderate scattered T2/FLAIR hyperintensity within the cerebral white matter is nonspecific, but consistent with chronic small vessel ischemic disease. Chronic lacunar infarcts within the left corona radiata and left thalamus. Cerebral volume is normal. No abnormal intracranial enhancement is demonstrated to suggest intracranial metastatic disease. There is no acute infarct. No evidence of intracranial mass. No chronic intracranial blood products. No extra-axial fluid collection. No midline shift. Vascular: Expected proximal arterial flow voids. Skull and upper cervical spine: No focal marrow lesion. Sinuses/Orbits: Visualized orbits show no acute finding. Small left maxillary sinus mucous retention cyst. Mild ethmoid sinus mucosal thickening. Trace right mastoid effusion. IMPRESSION: 1. Mildly motion degraded examination. 2. No evidence of intracranial metastatic disease. 3. Moderate chronic small vessel ischemic changes within the cerebral white matter. Chronic lacunar infarcts within the left corona radiata and left thalamus. 4. Mild ethmoid sinus mucosal thickening. Small left maxillary sinus mucous retention cysts. 5. Trace right mastoid effusion Electronically Signed   By: KKellie SimmeringDO   On: 08/21/2019 14:49    Assessment:  Scott WAREHIMEis a 69y.o. male with metastatic small cell lung cancer.  He has a > 40 pack year smoking.  He presented with a recurrent left sided  pleural effusion.  He has undergone thoracentesis x 2 in the past month.  He is s/p pigtail drain.  Initial cytology x 2 was negative (lymphocytic exudative effusion).  Cytology on 08/18/2019 confirmed small cell lung cancer.   Chest CT without contrast on 08/19/2019 revealed the left pleural pigtail drain.  There was a small residual left hydropneumothorax.  There was a 6.7 x 4 cm medial basilar left lower lobe lung mass suspicious for primary bronchogenic carcinoma.  There was a lingular 1.0 cm solid pulmonary nodule suspicious for ipsilateral metastasis.  There was intralobular septal thickening and patchy groundglass opacity throughout the lingula and the left lower lobe with moderate residual atelectasis of the left lung base.  A component of lymphangitic carcinomatosis was suspected.  There was ipsilateral infra hilar, subcarinal, AP window, prevascular and upper retroperitoneal adenopathy c/w metastatic disease.  Head MRI on 08/21/2019 revealed no evidence of metastatic disease.  He has hyponatremia likely secondary to SIADH.  He has a significant family history of malignancy.  A family member has had genetic testing (unknown + result).  Symptomatically, he notes chronic mild shortness of breath.  Plan:   1.   Metastatic small cell lung cancer  Clinical stage T3N2M1 (stage IV).             Pleural fluid cytology confirmed small cell lung cancer.             Head MRI was negative for CNS metastasis.             Review plans for outpatient PET scan next week for a baseline prior to initiation of treatment.  Discuss port-a-cath placement.     Patient in agreement.  Discuss plans for treatment:   Carboplatin and etoposide + PDL-1 antibody (atezolizumab or durvalumab) x 4 cycles followed by maintenance PD-L1 antibody.   Several questions asked and answered.  I will contact TNunzio Cobbs([phone], his power of attorney,  to review.  Anticipate resolution of recurrent pleural fluid  with treatment.   Consider keeping temporary Pleur-X catheter per pulmonary medicine. 2.   Post-obstructive pneumonia             Patient currently on ceftriaxone and azithromycin.  Anticipate switch to Augmentin to complete outpatient antibiotic course. 3.   Hyponatremia             Sodium has improved from 120 to 132.             Etiology likely secondary to SIADH. 4.   Family history of malignancy             [name], his niece, will try to obtain family records of prior genetic testing.             Patient will undergo genetic testing as an outpatient. 5.   Disposition  Anticipate discharge home tomorrow.  Will arrange follow-up in the oncology clinic next week.   Lequita Asal, MD  08/22/2019, 9:43 PM

## 2019-08-22 NOTE — TOC Progression Note (Signed)
Transition of Care [facility]) - Progression Note    Patient Details  Name: Scott Jordan MRN: [id number] Date of Birth: April 25, 1950  Transition of [facility]) CM/SW Contact  Beverly Sessions, RN Phone Number: 08/22/2019, 9:12 AM  Clinical Narrative:    RNCM received message that patient's niece is requesting a call.  Patient has given permission for [facility] to speak with niece.  RNCM has left voicemail for niece natasha.    Expected Discharge Plan: Home/Self Care Barriers to Discharge: Continued Medical Work up  Expected Discharge Plan and Services Expected Discharge Plan: Home/Self Care       Living arrangements for the past 2 months: Single Family Home                                       Social Determinants of Health (SDOH) Interventions    Readmission Risk Interventions No flowsheet data found.

## 2019-08-22 NOTE — Progress Notes (Signed)
Date: 08/22/2019,   MRN# [id number] Scott Jordan 1950-10-26 Code Status:     Code Status Orders  (From admission, onward)         Start     Ordered   08/17/19 2139  Full code  Continuous     08/17/19 2147        Code Status History    Date Active Date Inactive Code Status Order ID Comments User Context   01/14/2019 0452 01/15/2019 1535 Full Code [id number]  Harrie Foreman, MD ED   Advance Care Planning Activity     Hosp day:@LENGTHOFSTAYDAYS @ Referring MD: @ATDPROV @     PCP:      AdmissionWeight: 64.4 kg                 CurrentWeight: 62.9 kg   HPI: In bed, comfortable, awaiting final cytology report. Appreciate t -surg input. Patient aware of options as discussed yesterday. He want to wait on the final path report before deciding on definitive procedure. Today, no new complaints.   PMHX:   Past Medical History:  Diagnosis Date  . A-fib ([facility]) 01/10/2019   pt st this was dx by Dr. Ubaldo Glassing  . Substance abuse [facility])    Surgical Hx:  Past Surgical History:  Procedure Laterality Date  . BACK SURGERY     Family Hx:  History reviewed. No pertinent family history. Social Hx:   Social History   Tobacco Use  . Smoking status: Current Every Day Smoker    Packs/day: 0.50    Types: Cigarettes  . Smokeless tobacco: Never Used  . Tobacco comment: pt st he cut back in order to breath better  Substance Use Topics  . Alcohol use: Yes  . Drug use: Yes    Comment: heroin   Medication:    Home Medication:    Current Medication: @CURMEDTAB @   Allergies:  Patient has no known allergies.  Review of Systems: Gen:  Denies  fever, sweats, chills HEENT: Denies blurred vision, double vision, ear pain, eye pain, hearing loss, nose bleeds, sore throat Cvc:  No dizziness, chest pain or heaviness Resp:   occassional cough, sob with activity, no worse.  Gi: Denies swallowing difficulty, stomach pain, nausea or vomiting, diarrhea, constipation, bowel incontinence Gu:  Denies  bladder incontinence, burning urine Ext:   No Joint pain, stiffness or swelling Skin: No skin rash, easy bruising or bleeding or hives Endoc:  No polyuria, polydipsia , polyphagia or weight change Psych: No depression, insomnia or hallucinations  Other:  All other systems negative  Physical Examination:   VS: BP 127/81   Pulse 79   Temp 98.2 F (36.8 C) (Oral)   Resp 16   Ht 6\' 2"  (1.88 m)   Wt 62.9 kg   SpO2 94%   BMI 17.80 kg/m   General Appearance: No distress, facial hair  Neuro: without focal findings, mental status, speech normal, alert and oriented, cranial nerves 2-12 intact, reflexes normal and symmetric, sensation grossly normal  HEENT: PERRLA, EOM intact, no ptosis, no other lesions noticed Pulmonary:Rare  wheezing, No rales  Occasional; sputum production. No blood. Left pig tail in place   Cardiovascular:  Normal S1,S2.  No m/r/g.  Abdominal aorta pulsation normal.    Abdomen:Benign, Soft, non-tender, No masses, hepatosplenomegaly, No lymphadenopathy Endoc: No evident thyromegaly, no signs of acromegaly or Cushing features Skin:   warm, no rashes, no ecchymosis  Extremities: normal, no cyanosis, clubbing, no edema, warm with normal capillary refill. Psych. Alert, fair  insight   Labs results:   Recent Labs    08/19/19 2221 08/20/19 0628 08/21/19 0449 08/22/19 0542  HGB  --   --  11.9* 12.4*  HCT  --   --  34.2* 34.8*  MCV  --   --  98.3 95.6  WBC  --   --  7.2 6.6  BUN 12 9 7* 6*  CREATININE 0.75 0.64 0.67 0.56*  GLUCOSE 99 113* 117* 107*  CALCIUM 7.8* 7.9* 8.0* 8.0*  ,    No results for input(s): PH in the last 72 hours.  Invalid input(s): PCO2, PO2, BASEEXCESS, BASEDEFICITE, TFT  Culture results:     Rad results:   MR BRAIN W WO CONTRAST  Result Date: 08/21/2019 CLINICAL DATA:  Cancer of unknown primary, staging. EXAM: MRI HEAD WITHOUT AND WITH CONTRAST TECHNIQUE: Multiplanar, multiecho pulse sequences of the brain and surrounding structures  were obtained without and with intravenous contrast. CONTRAST:  18mL GADAVIST GADOBUTROL 1 MMOL/ML IV SOLN COMPARISON:  No pertinent prior studies available for comparison. FINDINGS: Brain: Mild intermittent motion degradation. Moderate scattered T2/FLAIR hyperintensity within the cerebral white matter is nonspecific, but consistent with chronic small vessel ischemic disease. Chronic lacunar infarcts within the left corona radiata and left thalamus. Cerebral volume is normal. No abnormal intracranial enhancement is demonstrated to suggest intracranial metastatic disease. There is no acute infarct. No evidence of intracranial mass. No chronic intracranial blood products. No extra-axial fluid collection. No midline shift. Vascular: Expected proximal arterial flow voids. Skull and upper cervical spine: No focal marrow lesion. Sinuses/Orbits: Visualized orbits show no acute finding. Small left maxillary sinus mucous retention cyst. Mild ethmoid sinus mucosal thickening. Trace right mastoid effusion. IMPRESSION: 1. Mildly motion degraded examination. 2. No evidence of intracranial metastatic disease. 3. Moderate chronic small vessel ischemic changes within the cerebral white matter. Chronic lacunar infarcts within the left corona radiata and left thalamus. 4. Mild ethmoid sinus mucosal thickening. Small left maxillary sinus mucous retention cysts. 5. Trace right mastoid effusion Electronically Signed   By: Kellie Simmering DO   On: 08/21/2019 14:49    Assessment and Plan: Hx of copd, presumed  left malignant effusion, metastatic dz, still draining. Awaiting final tumor type To decide on pleurodesis ( thorascopically vs chemically) vs placing a pleurac catheter. following  I have personally obtained a history, examined the patient, evaluated laboratory and imaging results, formulated the assessment and plan and placed orders.  The Patient requires high complexity decision making for assessment and support, frequent  evaluation and titration of therapies, application of advanced monitoring technologies and extensive interpretation of multiple databases.   Lavine Hargrove,M.D. Pulmonary & Critical care Medicine [facility]

## 2019-08-23 ENCOUNTER — Other Ambulatory Visit: Payer: Self-pay | Admitting: Cardiothoracic Surgery

## 2019-08-23 ENCOUNTER — Telehealth: Payer: Self-pay | Admitting: Cardiothoracic Surgery

## 2019-08-23 ENCOUNTER — Other Ambulatory Visit: Payer: Self-pay | Admitting: Hematology and Oncology

## 2019-08-23 DIAGNOSIS — C3490 Malignant neoplasm of unspecified part of unspecified bronchus or lung: Secondary | ICD-10-CM

## 2019-08-23 DIAGNOSIS — E222 Syndrome of inappropriate secretion of antidiuretic hormone: Secondary | ICD-10-CM

## 2019-08-23 LAB — PHOSPHORUS: Phosphorus: 3.7 mg/dL (ref 2.5–4.6)

## 2019-08-23 LAB — BASIC METABOLIC PANEL
Anion gap: 6 (ref 5–15)
BUN: 7 mg/dL — ABNORMAL LOW (ref 8–23)
CO2: 27 mmol/L (ref 22–32)
Calcium: 8.5 mg/dL — ABNORMAL LOW (ref 8.9–10.3)
Chloride: 98 mmol/L (ref 98–111)
Creatinine, Ser: 0.69 mg/dL (ref 0.61–1.24)
GFR calc Af Amer: >60 mL/min (ref >60)
GFR calc non Af Amer: >60 mL/min (ref >60)
Glucose, Bld: 115 mg/dL — ABNORMAL HIGH (ref 70–99)
Potassium: 4.4 mmol/L (ref 3.5–5.1)
Sodium: 131 mmol/L — ABNORMAL LOW (ref 135–145)

## 2019-08-23 LAB — CBC
HCT: 33.7 % — ABNORMAL LOW (ref 39.0–52.0)
Hemoglobin: 12.2 g/dL — ABNORMAL LOW (ref 13.0–17.0)
MCH: 34.9 pg — ABNORMAL HIGH (ref 26.0–34.0)
MCHC: 36.2 g/dL — ABNORMAL HIGH (ref 30.0–36.0)
MCV: 96.3 fL (ref 80.0–100.0)
Platelets: 205 10*3/uL (ref 150–400)
RBC: 3.5 MIL/uL — ABNORMAL LOW (ref 4.22–5.81)
RDW: 11.9 % (ref 11.5–15.5)
WBC: 6 10*3/uL (ref 4.0–10.5)
nRBC: 0 % (ref 0.0–0.2)

## 2019-08-23 LAB — MAGNESIUM: Magnesium: 1.7 mg/dL (ref 1.7–2.4)

## 2019-08-23 MED ORDER — DILTIAZEM HCL ER COATED BEADS 240 MG PO CP24: 240.0000 mg | ORAL_CAPSULE | Freq: Every day | ORAL | 2 refills | Status: DC

## 2019-08-23 MED ORDER — LOSARTAN POTASSIUM 25 MG PO TABS: 25.0000 mg | ORAL_TABLET | Freq: Every day | ORAL | 2 refills | Status: DC

## 2019-08-23 MED ORDER — ELIQUIS 5 MG PO TABS: ORAL_TABLET | ORAL | Status: DC

## 2019-08-23 MED ORDER — ENSURE ENLIVE PO LIQD: 237.0000 mL | Freq: Three times a day (TID) | ORAL | 12 refills | Status: AC

## 2019-08-23 MED ORDER — KETOROLAC TROMETHAMINE 15 MG/ML IJ SOLN
15.0000 mg | Freq: Once | INTRAMUSCULAR | Status: AC
  Administered 2019-08-23: 15 mg via INTRAVENOUS
  Filled 2019-08-23: qty 1

## 2019-08-23 MED ORDER — MAGNESIUM SULFATE 2 GM/50ML IV SOLN
2.0000 g | Freq: Once | INTRAVENOUS | Status: AC
  Administered 2019-08-23: 2 g via INTRAVENOUS
  Filled 2019-08-23: qty 50

## 2019-08-23 NOTE — Plan of Care (Signed)
Discharge teaching with patient who verbalized understanding of teaching.  Patient is in stable condition and has all belongings.

## 2019-08-23 NOTE — Care Management Important Message (Signed)
Important Message  Patient Details  Name: Scott Jordan MRN: [id number] Date of Birth: Jun 02, 1950   Medicare Important Message Given:  Yes     Dannette Barbara 08/23/2019, 1:37 PM

## 2019-08-23 NOTE — Plan of Care (Signed)
Continuing with plan of care. 

## 2019-08-23 NOTE — Progress Notes (Signed)
START ON PATHWAY REGIMEN - Small Cell Lung     Cycles 1 through 4, every 21 days:     Atezolizumab      Carboplatin      Etoposide    Cycles 5 and beyond, every 21 days:     Atezolizumab   **Always confirm dose/schedule in your pharmacy ordering system**  Patient Characteristics: Newly Diagnosed, Preoperative or Nonsurgical Candidate (Clinical Staging), First Line, Extensive Stage Therapeutic Status: Newly Diagnosed, Preoperative or Nonsurgical Candidate (Clinical Staging) AJCC T Category: cT3 AJCC N Category: cN2 AJCC M Category: cM1a AJCC 8 Stage Grouping: IVA Stage Classification: Extensive Intent of Therapy: Non-Curative / Palliative Intent, Discussed with Patient

## 2019-08-23 NOTE — Progress Notes (Signed)
[facility] for Electrolyte Monitoring and Replacement   Recent Labs: Potassium (mmol/L)  Date Value  08/23/2019 4.4   Magnesium (mg/dL)  Date Value  08/23/2019 1.7   Calcium (mg/dL)  Date Value  08/23/2019 8.5 (L)   Albumin (g/dL)  Date Value  08/17/2019 3.8   Phosphorus (mg/dL)  Date Value  08/23/2019 3.7   Sodium (mmol/L)  Date Value  08/23/2019 131 (L)     Assessment: 69 year old male presented with SOB. Patient with recurrent left large pleural effusion with mass concerning for malignancy. Patient is malnourished and at risk for refeeding syndrome. Pharmacy consulted for electrolytes.  Goal of Therapy:  Electrolytes WNL, K ~ 4 and Mg ~ 2  Plan:  K 4.4, Mg: 1.7 Phos 3.7  Scr 0.69.  Will replace with Magnesium Sulfate 2gm IV x 1 dose.  Will follow up all electrolytes with morning labs and continue to replace as needed.  Noralee Space, PharmD Clinical Pharmacist 08/23/2019 9:58 AM

## 2019-08-23 NOTE — Telephone Encounter (Signed)
Outgoing call is made, mailbox full, could not leave a message.  Please advise patient of Pre-Admission date/time, COVID Testing date and Surgery date.  Per Dr. Genevive Bi states patient currently in-patient, will be discharged today (08/23/19)  Surgery Date: 08/29/19 Preadmission Testing Date: 08/26/19 (phone 1p-5p) Covid Testing Date: 08/27/19 - patient advised to go to the [facility] ([place] between 8a-1p  Patient also needs to call [phone], between 1-3:00pm the day before surgery, to find out what time to arrive for surgery.

## 2019-08-23 NOTE — Discharge Summary (Addendum)
Physician Discharge Summary   Scott Jordan  male DOB: 1950/11/12  KTG:[id number]  PCP: Patient, No Pcp Per  Admit date: 08/17/2019 Discharge date: 08/23/2019  Admitted From: home Disposition:  home  CODE STATUS: Full code  Discharge Instructions    Diet - low sodium heart healthy   Complete by: As directed    Discharge instructions   Complete by: As directed    You will see cancer doctor Dr. Mike Gip in her clinic on Monday 08/26/19.  Dr. Genevive Bi will arrange for you to have a port placed for the cancer treatment infusion.  Please hold blood thinner until further instruction from your cancer doctor or lung doctor.  Try to eat protein-rich foods.   Dr. Enzo Bi - -   Increase activity slowly   Complete by: As directed        Hospital Course:  For full details, please see H&P, progress notes, consult notes and ancillary notes.  Briefly,  DaleMillsis a69 y.o.Caucasian malewith a known history of ongoing tobacco abuse, atrial fibrillation and substance abuse,whopresented to the emergency room with acute onset of worsening dyspnea latelywith associated dry cough and mild wheezing. About a month ago he had thoracentesis of left pleural effusion and2 weeks ago he had it again for recurrence. No reported fever or chills.He has lost more than 20 pounds over the last 6 months.  Prior thoracentesis results were without any malignant cells.  CTA on presentation showed a suspicious lung mass and multiple enlarged necrotic lymph nodes consistent with metastatic disease.    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  Oncology consulted.  Pulmonary was consulted and performed thoracentesis with placement of chest tube; more than 2 L pleural effusion removed, cytology returned positive for small cell carcinoma.  MRI brain  without mets  Pt will follow up with oncology as outpatient for cancer treatment.  Chest tube was removed prior to discharge as malignant pleural effusion is expected to improve after cancer treatment, and if pleural effusion recurs, pt can have outpatient thoracentesis.  Anticipate PET scan as an outpatient or abdomen and pelvis CT to complete staging.  Port placement with Dr. Genevive Bi will be arranged as outpatient procedure.    Left basal pneumonia likely postobstructive. Finished 5 days of Rocephin and Zithromax.  Since pt's respiratory status was stable without symptoms of PNA, pt was not discharged on more oral abx.  Hyponatremia 2/2 dehydration SIADH ruled out Na 120 on presentation.  Serum osm 258 (dilute), urine osm 191 (dilute), urine Na<10 (low), which were not consistent with SIADH.  Pt received IVF on initial presentation which improved Na.  With improved oral intake, pt's Na improved gradually to 131 on the day of discharge.     Chronic atrial fibrillation.  Currently rate controlled. Continued home dose of Cardizem.  Eliquis held due to procedures with chest tube and biopsy, and continued to be held on discharge due to bleeding risk of cancer and malignant pleural effusion.  Hypertension.  Continued with Cozaar and Dilt.  Alcohol use.   Patient drinks on a regular basis.  CIWA score remained 0.  Tobacco abuse.   Consulting was provided.  Pt did not want a nicotine patch at this time.   Discharge Diagnoses:  Active Problems:   Malignant pleural effusion   Hyponatremia   Dyspnea   Pleural effusion   Recurrent pleural effusion  on left   Protein-calorie malnutrition, severe    Discharge Instructions:  Allergies as of 08/23/2019   No Known Allergies     Medication List    TAKE these medications   diltiazem 240 MG 24 hr capsule Commonly known as: CARDIZEM CD Take 1 capsule (240 mg total) by mouth daily.   Eliquis 5 MG Tabs tablet Generic drug: apixaban Hold  this blood thinner until outpatient oncology or pulmonary followup. What changed:   how much to take  how to take this  when to take this  additional instructions   feeding supplement (ENSURE ENLIVE) Liqd Take 237 mLs by mouth 3 (three) times daily between meals.   losartan 25 MG tablet Commonly known as: COZAAR Take 1 tablet (25 mg total) by mouth daily.         No Known Allergies   The results of significant diagnostics from this hospitalization (including imaging, microbiology, ancillary and laboratory) are listed below for reference.   Consultations:   Procedures/Studies: DG Chest 1 View  Result Date: 08/02/2019 CLINICAL DATA:  Left pleural effusion, post thoracentesis EXAM: CHEST  1 VIEW COMPARISON:  07/22/2019 FINDINGS: No pneumothorax. Trace residual left effusion suspected. Patchy consolidation/atelectasis at the left lung base as before. Right lung clear. Heart size and mediastinal contours are within normal limits. Aortic Atherosclerosis (ICD10-170.0). Visualized bones unremarkable. IMPRESSION: No pneumothorax post thoracentesis. Electronically Signed   By: Lucrezia Europe M.D.   On: 08/02/2019 15:04   DG Chest 2 View  Result Date: 08/17/2019 CLINICAL DATA:  Short of breath, recent diagnosis of pneumonia, recent thoracentesis EXAM: CHEST - 2 VIEW COMPARISON:  08/02/2019 FINDINGS: Frontal and lateral views of the chest demonstrate increased left pleural effusion and left basilar consolidation. Right chest is clear. No pneumothorax. Cardiac silhouette is obscured by the consolidation and effusion at the left lung base. No acute bony abnormalities. IMPRESSION: 1. Progressive left basilar consolidation and effusion. Electronically Signed   By: Randa Ngo M.D.   On: 08/17/2019 19:46   CT CHEST WO CONTRAST  Result Date: 08/19/2019 CLINICAL DATA:  Inpatient. Status post left pleural chest tube placement. Follow-up left lower lobe lung mass. EXAM: CT CHEST WITHOUT CONTRAST  TECHNIQUE: Multidetector CT imaging of the chest was performed following the standard protocol without IV contrast. COMPARISON:  08/17/2019 chest CT. 08/18/2019 chest radiograph. FINDINGS: Cardiovascular: Normal heart size. No significant pericardial effusion/thickening. Three-vessel coronary atherosclerosis. Atherosclerotic nonaneurysmal thoracic aorta. Normal caliber pulmonary arteries. Mediastinum/Nodes: No discrete thyroid nodules. Unremarkable esophagus. No axillary adenopathy. Stable enlarged 1.0 cm left prevascular node (series 2/image 79) at the level of the pulmonic trunk. Stable enlarged 2.0 cm subcarinal node (series 2/image 79). Stable mild AP window adenopathy up to 1.0 cm (series 2/image 64). Stable enlarged 1.9 cm left infrahilar node (series 2/image 89). Stable enlarged 1.6 cm lower right paraesophageal node (series 2/image 128). No discrete right hilar adenopathy on these noncontrast images. Lungs/Pleura: No right pneumothorax. No right pleural effusion. Interval placement of left pleural pigtail drain terminating in the peripheral mid to lower left pleural space. Small residual left hydropneumothorax with minimal apical pneumothorax component. Irregular nodular dependent basilar left pleural thickening, unchanged. Poorly delineated 6.7 x 4.0 cm medial basilar left lower lobe lung mass (series 2/image 121). Moderate centrilobular and paraseptal emphysema. Lingular 1.0 cm solid pulmonary nodule (series 3/image 108). Interlobular septal thickening and patchy ground-glass opacity throughout lingula and left lower lobe with residual moderate atelectasis at the left lung base. Upper abdomen: Enlarged 2.0 cm gastrohepatic ligament  node (series 2/image 152). Enlarged 1.7 cm left para-aortic node (series 2/image 165). Musculoskeletal: No aggressive appearing focal osseous lesions. Mild thoracic spondylosis. IMPRESSION: 1. Interval placement of left pleural pigtail drain. Small residual left  hydropneumothorax with minimal apical pneumothorax component. 2. Poorly delineated 6.7 x 4.0 cm medial basilar left lower lobe lung mass, highly suspicious for primary bronchogenic carcinoma. 3. Lingular 1.0 cm solid pulmonary nodule, suspicious for ipsilateral metastasis. 4. Interlobular septal thickening and patchy ground-glass opacity throughout the lingula and left lower lobe with residual moderate atelectasis at the left lung base. 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 5. Ipsilateral infrahilar, subcarinal, AP window, prevascular and upper retroperitoneal lymphadenopathy compatible with metastatic disease. PET-CT suggested for complete staging evaluation. 6. Three-vessel coronary atherosclerosis. 7. Aortic Atherosclerosis (ICD10-I70.0) and Emphysema (ICD10-J43.9). Electronically Signed   By: Ilona Sorrel M.D.   On: 08/19/2019 08:08   CT Chest W Contrast  Result Date: 08/17/2019 CLINICAL DATA:  Shortness of breath. EXAM: CT CHEST WITH CONTRAST TECHNIQUE: Multidetector CT imaging of the chest was performed during intravenous contrast administration. CONTRAST:  49mL OMNIPAQUE IOHEXOL 300 MG/ML  SOLN COMPARISON:  June 11, 2004 FINDINGS: Cardiovascular: There is mild calcification of the aortic arch. The pulmonary arteries are limited in evaluation secondary to suboptimal opacification with intravenous contrast. No intraluminal filling defects are identified. Normal heart size. No pericardial effusion. Marked severity coronary artery calcification is seen Mediastinum/Nodes: Multiple subcentimeter pretracheal lymph nodes are seen. Lungs/Pleura: There is mild emphysematous lung disease. Moderate to marked severity consolidation is seen throughout the left lower lobe. A 6.3 cm x 3.4 cm x 4.1 cm heterogeneous low-attenuation soft tissue mass is seen within the posteromedial aspect of the left lung base (axial CT image 144, CT series number 2/coronal re-formatted image 97, CT series number 5). There is a  large left pleural effusion. No pneumothorax is identified. Upper Abdomen: Enlarged, necrotic lymph nodes are seen within the para-aortic and aortocaval regions. A 3.9 cm x 2.9 cm and 2.2 cm x 1.8 cm heterogeneous low-attenuation soft tissue masses are seen in between the medial aspect of the inferior vena cava and distal aspect of the descending thoracic aorta (axial CT images 137 through 135, CT series number 2). A 1.9 cm x 1.6 cm cystic appearing areas seen within the anterior aspect of the mid right kidney. Musculoskeletal: No chest wall abnormality. No acute or significant osseous findings. IMPRESSION: 1. 6.3 cm x 3.4 cm x 4.1 cm heterogeneous left lower lobe lung mass, worrisome for malignancy. 2. Moderate to marked severity left lower lobe consolidation. 3. Large left pleural effusion. 4. Enlarged, necrotic para-aortic and aortocaval lymph nodes within the chest and abdomen, consistent with metastatic disease. Aortic Atherosclerosis (ICD10-I70.0). Electronically Signed   By: Virgina Norfolk M.D.   On: 08/17/2019 21:14   MR BRAIN W WO CONTRAST  Result Date: 08/21/2019 CLINICAL DATA:  Cancer of unknown primary, staging. EXAM: MRI HEAD WITHOUT AND WITH CONTRAST TECHNIQUE: Multiplanar, multiecho pulse sequences of the brain and surrounding structures were obtained without and with intravenous contrast. CONTRAST:  87mL GADAVIST GADOBUTROL 1 MMOL/ML IV SOLN COMPARISON:  No pertinent prior studies available for comparison. FINDINGS: Brain: Mild intermittent motion degradation. Moderate scattered T2/FLAIR hyperintensity within the cerebral white matter is nonspecific, but consistent with chronic small vessel ischemic disease. Chronic lacunar infarcts within the left corona radiata and left thalamus. Cerebral volume is normal. No abnormal intracranial enhancement is demonstrated to suggest intracranial metastatic disease. There is no acute infarct. No evidence of intracranial mass. No  chronic intracranial blood  products. No extra-axial fluid collection. No midline shift. Vascular: Expected proximal arterial flow voids. Skull and upper cervical spine: No focal marrow lesion. Sinuses/Orbits: Visualized orbits show no acute finding. Small left maxillary sinus mucous retention cyst. Mild ethmoid sinus mucosal thickening. Trace right mastoid effusion. IMPRESSION: 1. Mildly motion degraded examination. 2. No evidence of intracranial metastatic disease. 3. Moderate chronic small vessel ischemic changes within the cerebral white matter. Chronic lacunar infarcts within the left corona radiata and left thalamus. 4. Mild ethmoid sinus mucosal thickening. Small left maxillary sinus mucous retention cysts. 5. Trace right mastoid effusion Electronically Signed   By: Kellie Simmering DO   On: 08/21/2019 14:49   DG Chest Port 1 View  Result Date: 08/18/2019 CLINICAL DATA:  Left-sided chest tube placement. EXAM: PORTABLE CHEST 1 VIEW COMPARISON:  Aug 17, 2019 FINDINGS: Since the prior study there is been interval placement of a left-sided chest tube. Its distal tip is seen overlying the lateral aspect of the mid left lung. There is a large, stable left pleural effusion. No pneumothorax is identified. The heart size and mediastinal contours are within normal limits. Degenerative changes seen within the mid to lower thoracic spine. IMPRESSION: 1. Interval placement of a left-sided chest tube. 2. Large, stable left pleural effusion. 3. No pneumothorax. Electronically Signed   By: Virgina Norfolk M.D.   On: 08/18/2019 16:36   ECHOCARDIOGRAM COMPLETE  Result Date: 08/19/2019    ECHOCARDIOGRAM REPORT   Patient Name:   Scott Jordan Date of Exam: 08/19/2019 Medical Rec #:  [id number]    Height:       74.0 in Accession #:    [id number]   Weight:       138.7 lb Date of Birth:  Feb 14, 1951     BSA:          1.860 m Patient Age:    12 years     BP:           136/74 mmHg Patient Gender: M            HR:           97 bpm. Exam Location:  [facility] Procedure:  2D Echo, Color Doppler and Cardiac Doppler Indications:     Dyspnea 786.09  History:         Patient has prior history of Echocardiogram examinations, most                  recent 01/14/2019. Arrythmias:Atrial Fibrillation.  Sonographer:     Sherrie Sport RDCS (AE) Referring Phys:  [id number] Tyler Pita Diagnosing Phys: Kathlyn Sacramento MD  Sonographer Comments: No apical window. Large bandage in apical space. IMPRESSIONS  1. Left ventricular ejection fraction, by estimation, is 50 to 55%. The left ventricle has low normal function. Left ventricular endocardial border not optimally defined to evaluate regional wall motion. There is moderate left ventricular hypertrophy. Left ventricular diastolic function could not be evaluated.  2. Right ventricular systolic function is normal. The right ventricular size is normal. Tricuspid regurgitation signal is inadequate for assessing PA pressure.  3. The mitral valve is normal in structure. No evidence of mitral valve regurgitation. No evidence of mitral stenosis.  4. The aortic valve is normal in structure. Aortic valve regurgitation is not visualized. Mild aortic valve sclerosis is present, with no evidence of aortic valve stenosis.  5. Very limited study due to lack of apical images. FINDINGS  Left Ventricle: Left ventricular ejection fraction, by  estimation, is 50 to 55%. The left ventricle has low normal function. Left ventricular endocardial border not optimally defined to evaluate regional wall motion. The left ventricular internal cavity  size was normal in size. There is moderate left ventricular hypertrophy. Left ventricular diastolic function could not be evaluated. Right Ventricle: The right ventricular size is normal. No increase in right ventricular wall thickness. Right ventricular systolic function is normal. Tricuspid regurgitation signal is inadequate for assessing PA pressure. The tricuspid regurgitant velocity is 2.52 m/s, and with an assumed right atrial  pressure of 10 mmHg, the estimated right ventricular systolic pressure is 56.4 mmHg. Left Atrium: Left atrial size was normal in size. Right Atrium: Right atrial size was normal in size. Pericardium: There is no evidence of pericardial effusion. Mitral Valve: The mitral valve is normal in structure. Normal mobility of the mitral valve leaflets. No evidence of mitral valve regurgitation. No evidence of mitral valve stenosis. Tricuspid Valve: The tricuspid valve is normal in structure. Tricuspid valve regurgitation is trivial. No evidence of tricuspid stenosis. Aortic Valve: The aortic valve is normal in structure. Aortic valve regurgitation is not visualized. Mild aortic valve sclerosis is present, with no evidence of aortic valve stenosis. Pulmonic Valve: The pulmonic valve was normal in structure. Pulmonic valve regurgitation is not visualized. No evidence of pulmonic stenosis. Aorta: The aortic root is normal in size and structure. Venous: The inferior vena cava was not well visualized. IAS/Shunts: No atrial level shunt detected by color flow Doppler.  LEFT VENTRICLE PLAX 2D LVIDd:         3.12 cm LVIDs:         2.21 cm LV PW:         1.21 cm LV IVS:        1.38 cm LVOT diam:     2.00 cm LVOT Area:     3.14 cm  LEFT ATRIUM         Index LA diam:    3.40 cm 1.83 cm/m                        PULMONIC VALVE AORTA                 PV Vmax:        0.38 m/s Ao Root diam: 3.10 cm PV Peak grad:   0.6 mmHg                       RVOT Peak grad: 2 mmHg  TRICUSPID VALVE TR Peak grad:   25.4 mmHg TR Vmax:        252.00 cm/s  SHUNTS Systemic Diam: 2.00 cm Kathlyn Sacramento MD Electronically signed by Kathlyn Sacramento MD Signature Date/Time: 08/19/2019/12:17:53 PM    Final    US THORACENTESIS ASP PLEURAL SPACE W/IMG GUIDE  Result Date: 08/02/2019 INDICATION: Patient with prior history of substance abuse, CHF, pneumonia, dyspnea, left pleural effusion; request received for diagnostic and therapeutic left thoracentesis. EXAM:  ULTRASOUND GUIDED DIAGNOSTIC AND THERAPEUTIC LEFT THORACENTESIS MEDICATIONS: None COMPLICATIONS: None immediate. PROCEDURE: An ultrasound guided thoracentesis was thoroughly discussed with the patient and questions answered. The benefits, risks, alternatives and complications were also discussed. The patient understands and wishes to proceed with the procedure. Written consent was obtained. Ultrasound was performed to localize and mark an adequate pocket of fluid in the left chest. The area was then prepped and draped in the normal sterile fashion. 1% Lidocaine was used for local  anesthesia. Under ultrasound guidance a 6 Fr Safe-T-Centesis catheter was introduced. Thoracentesis was performed. The catheter was removed and a dressing applied. FINDINGS: A total of approximately 900 cc of blood-tinged fluid was removed. Samples were sent to the laboratory as requested by the clinical team. IMPRESSION: Successful ultrasound guided diagnostic and therapeutic left thoracentesis yielding 900 cc of pleural fluid. Follow-up chest x-ray revealed no pneumothorax. Read by: Rowe Robert, PA-C Electronically Signed   By: Lucrezia Europe M.D.   On: 08/02/2019 15:08      Labs: BNP (last 3 results) Recent Labs    08/17/19 1921  BNP 557.3*   Basic Metabolic Panel: Recent Labs  Lab 08/19/19 0622 08/19/19 1359 08/19/19 2221 08/20/19 0628 08/21/19 0449 08/22/19 0542 08/23/19 0444  NA 127*   < > 127* 129* 129* 132* 131*  K 4.2   < > 4.0 4.1 4.3 3.7 4.4  CL 94*   < > 95* 96* 97* 99 98  CO2 27   < > 27 27 28 25 27   GLUCOSE 116*   < > 99 113* 117* 107* 115*  BUN 11   < > 12 9 7* 6* 7*  CREATININE 0.63   < > 0.75 0.64 0.67 0.56* 0.69  CALCIUM 8.0*   < > 7.8* 7.9* 8.0* 8.0* 8.5*  MG 1.7  --   --  1.7 1.8 1.9 1.7  PHOS 3.3  --   --  2.6 2.9  --  3.7   < > = values in this interval not displayed.   Liver Function Tests: Recent Labs  Lab 08/17/19 1921  AST 33  ALT 21  ALKPHOS 87  BILITOT 0.8  PROT 7.9   ALBUMIN 3.8   No results for input(s): LIPASE, AMYLASE in the last 168 hours. No results for input(s): AMMONIA in the last 168 hours. CBC: Recent Labs  Lab 08/18/19 0359 08/19/19 0622 08/21/19 0449 08/22/19 0542 08/23/19 0444  WBC 11.3* 8.4 7.2 6.6 6.0  NEUTROABS  --  6.2  --   --   --   HGB 14.5 12.9* 11.9* 12.4* 12.2*  HCT 40.6 37.2* 34.2* 34.8* 33.7*  MCV 96.9 99.5 98.3 95.6 96.3  PLT 181 158 171 193 205   Cardiac Enzymes: No results for input(s): CKTOTAL, CKMB, CKMBINDEX, TROPONINI in the last 168 hours. BNP: Invalid input(s): POCBNP CBG: No results for input(s): GLUCAP in the last 168 hours. D-Dimer No results for input(s): DDIMER in the last 72 hours. Hgb A1c No results for input(s): HGBA1C in the last 72 hours. Lipid Profile No results for input(s): CHOL, HDL, LDLCALC, TRIG, CHOLHDL, LDLDIRECT in the last 72 hours. Thyroid function studies No results for input(s): TSH, T4TOTAL, T3FREE, THYROIDAB in the last 72 hours.  Invalid input(s): FREET3 Anemia work up No results for input(s): VITAMINB12, FOLATE, FERRITIN, TIBC, IRON, RETICCTPCT in the last 72 hours. Urinalysis No results found for: COLORURINE, APPEARANCEUR, [place], [place], [place], [place], [place], [place], PROTEINUR, UROBILINOGEN, NITRITE, LEUKOCYTESUR Sepsis Labs Invalid input(s): PROCALCITONIN,  WBC,  LACTICIDVEN Microbiology Recent Results (from the past 240 hour(s))  Culture, sputum-assessment     Status: None   Collection Time: 08/17/19 10:59 PM   Specimen: Expectorated Sputum  Result Value Ref Range Status   Specimen Description EXPECTORATED SPUTUM  Final   Special Requests NONE  Final   Sputum evaluation   Final    Sputum specimen not acceptable for testing.  Please recollect.   NOTIFIED KATIE FERGUSON 08/18/19 AT 0240 HS Performed at [facility]  [facility], [place], [place] [address]    Report Status 08/18/2019 FINAL  Final  Respiratory Panel by RT PCR (Flu A&B,  Covid) - Nasopharyngeal Swab     Status: None   Collection Time: 08/18/19 12:06 AM   Specimen: Nasopharyngeal Swab  Result Value Ref Range Status   SARS Coronavirus 2 by RT PCR NEGATIVE NEGATIVE Final    Comment: (NOTE) SARS-CoV-2 target nucleic acids are NOT DETECTED. The SARS-CoV-2 RNA is generally detectable in upper respiratoy specimens during the acute phase of infection. The lowest concentration of SARS-CoV-2 viral copies this assay can detect is 131 copies/mL. A negative result does not preclude SARS-Cov-2 infection and should not be used as the sole basis for treatment or other patient management decisions. A negative result may occur with  improper specimen collection/handling, submission of specimen other than nasopharyngeal swab, presence of viral mutation(s) within the areas targeted by this assay, and inadequate number of viral copies (<131 copies/mL). A negative result must be combined with clinical observations, patient history, and epidemiological information. The expected result is Negative. Fact Sheet for Patients:  PinkCheek.be Fact Sheet for Healthcare Providers:  GravelBags.it This test is not yet ap proved or cleared by the [place] FDA and  has been authorized for detection and/or diagnosis of SARS-CoV-2 by FDA under an Emergency Use Authorization (EUA). This EUA will remain  in effect (meaning this test can be used) for the duration of the COVID-19 declaration under Section 564(b)(1) of the Act, 21 U.S.C. section 360bbb-3(b)(1), unless the authorization is terminated or revoked sooner.    Influenza A by PCR NEGATIVE NEGATIVE Final   Influenza B by PCR NEGATIVE NEGATIVE Final    Comment: (NOTE) The Xpert Xpress SARS-CoV-2/FLU/RSV assay is intended as an aid in  the diagnosis of influenza from Nasopharyngeal swab specimens and  should not be used as a sole basis for treatment. Nasal washings and   aspirates are unacceptable for Xpert Xpress SARS-CoV-2/FLU/RSV  testing. Fact Sheet for Patients: PinkCheek.be Fact Sheet for Healthcare Providers: GravelBags.it This test is not yet approved or cleared by the [place] FDA and  has been authorized for detection and/or diagnosis of SARS-CoV-2 by  FDA under an Emergency Use Authorization (EUA). This EUA will remain  in effect (meaning this test can be used) for the duration of the  Covid-19 declaration under Section 564(b)(1) of the Act, 21  U.S.C. section 360bbb-3(b)(1), unless the authorization is  terminated or revoked. Performed at [facility], [address] [place], [place] [address]   MRSA PCR Screening     Status: None   Collection Time: 08/18/19 11:32 AM   Specimen: Nasopharyngeal  Result Value Ref Range Status   MRSA by PCR NEGATIVE NEGATIVE Final    Comment:        The GeneXpert MRSA Assay (FDA approved for NASAL specimens only), is one component of a comprehensive MRSA colonization surveillance program. It is not intended to diagnose MRSA infection nor to guide or monitor treatment for MRSA infections. Performed at [facility], [address] [place], [place] [address]      Total time spend on discharging this patient, including the last patient exam, discussing the hospital stay, instructions for ongoing care as it relates to all pertinent caregivers, as well as preparing the medical discharge records, prescriptions, and/or referrals as applicable, is 50 minutes.    Enzo Bi, MD  [facility] 08/23/2019, 11:57 AM  If 7PM-7AM, please contact night-coverage

## 2019-08-23 NOTE — Progress Notes (Signed)
Date: 08/23/2019,   MRN# [id number] Scott Jordan 1951-04-10 Code Status:     Code Status Orders  (From admission, onward)         Start     Ordered   08/17/19 2139  Full code  Continuous     08/17/19 2147        Code Status History    Date Active Date Inactive Code Status Order ID Comments User Context   01/14/2019 0452 01/15/2019 1535 Full Code [id number]  Harrie Foreman, MD ED   Advance Care Planning Activity     Hosp day:@LENGTHOFSTAYDAYS @ Referring MD: @ATDPROV @     PCP:  HPI: Pleural effusion is positive for small cell cancer. No new sxs, pig tail removed, to be d/c today. portacath to be placed next week chemo planned. Will be following effusion. Final plan to be decided.        PMHX:   Past Medical History:  Diagnosis Date  . A-fib ([place]) 01/10/2019   pt st this was dx by Dr. Ubaldo Glassing  . Substance abuse [facility])    Surgical Hx:  Past Surgical History:  Procedure Laterality Date  . BACK SURGERY     Family Hx:  History reviewed. No pertinent family history. Social Hx:   Social History   Tobacco Use  . Smoking status: Current Every Day Smoker    Packs/day: 0.50    Types: Cigarettes  . Smokeless tobacco: Never Used  . Tobacco comment: pt st he cut back in order to breath better  Substance Use Topics  . Alcohol use: Yes  . Drug use: Yes    Comment: heroin   Medication:    Home Medication:  Current Outpatient Rx  . Order #: [id number] Class: Normal  . Order #: [id number] Class: No Print  . Order #: [id number] Class: OTC  . Order #: [id number] Class: Normal    Current Medication: @CURMEDTAB @   Allergies:  Patient has no known allergies.  Review of Systems: Gen:  Denies  fever, sweats, chills HEENT: Denies blurred vision, double vision, ear pain, eye pain, hearing loss, nose bleeds, sore throat Cvc:  No dizziness, chest pain or heaviness Resp:  Dyspnea better,ready to go home  Gi: Denies swallowing difficulty, stomach pain, nausea or vomiting, diarrhea,  constipation, bowel incontinence Gu:  Denies bladder incontinence, burning urine Ext:   No Joint pain, stiffness or swelling Skin: No skin rash, easy bruising or bleeding or hives Endoc:  No polyuria, polydipsia , polyphagia or weight change Psych: No depression, insomnia or hallucinations  Other:  All other systems negative  Physical Examination:   VS: BP 133/89 (BP Location: Right Arm)   Pulse 68   Temp 97.9 F (36.6 C) (Oral)   Resp 18   Ht 6\' 2"  (1.88 m)   Wt 62.9 kg   SpO2 94%   BMI 17.80 kg/m   General Appearance: No distress  Neuro: without focal findings, mental status, speech normal, alert and oriented, cranial nerves 2-12 intact, reflexes normal and symmetric, sensation grossly normal  HEENT: PERRLA, EOM intact, no ptosis, no other lesions noticed, Mallampati: Pulmonary:.No wheezing, No rales  Tube out Cardiovascular:  Normal S1,S2.  No m/r/g.  Abdominal aorta pulsation normal.    Abdomen:Benign, Soft, non-tender, No masses, hepatosplenomegaly, No lymphadenopathy Endoc: No evident thyromegaly, no signs of acromegaly or Cushing features Skin:   warm, no rashes, no ecchymosis  Extremities: normal, no cyanosis, clubbing, no edema, warm with normal capillary refill. Other findings:   Labs results:   Recent  Labs    08/21/19 0449 08/22/19 0542 08/23/19 0444  HGB 11.9* 12.4* 12.2*  HCT 34.2* 34.8* 33.7*  MCV 98.3 95.6 96.3  WBC 7.2 6.6 6.0  BUN 7* 6* 7*  CREATININE 0.67 0.56* 0.69  GLUCOSE 117* 107* 115*  CALCIUM 8.0* 8.0* 8.5*  ,       Assessment and Plan:  Hx of copd, presumed  left small cell effusion, metastatic dz Home Repeat cxr mid week Portacath next week Following oncology recs  To decide on pleurodesis ( thorascopically vs chemically) vs placing a pleurac catheter. following  I have personally obtained a history, examined the patient, evaluated laboratory and imaging results, formulated the assessment and plan and placed orders.  The Patient  requires high complexity decision making for assessment and support, frequent evaluation and titration of therapies, application of advanced monitoring technologies and extensive interpretation of multiple databases.   Herbon Fleming,M.D. Pulmonary & Critical care Medicine [facility]

## 2019-08-23 NOTE — Progress Notes (Signed)
Patient ID: Scott Jordan, male   DOB: 1951/02/12, 69 y.o.   MRN: [id number]   A long discussion regarding the management of his left pleural effusion and his need for IV access.    There is no air leak on the chest tube and drainage was less than 100 cc  I removed his chest tube without incident  Lungs are clear except for the left base Heart reg  I discussed with him the need for [facility] A Cath placement and reviewed the risks - bleeding, infection, pneumothorax and death. He would like to proceed and we will schedule this for next week.  All questions answered and dressing care discussed.

## 2019-08-23 NOTE — TOC Transition Note (Signed)
Transition of Care [facility]) - CM/SW Discharge Note   Patient Details  Name: MATTHAN SLEDGE MRN: [id number] Date of Birth: 04/10/1951  Transition of Care [facility]) CM/SW Contact:  Beverly Sessions, RN Phone Number: 08/23/2019, 3:07 PM   Clinical Narrative:    Patient to discharge home today  Chest tube has been removed prior to discharge Patient will have portacath placed outpatient next week  Per MD no indication for home health or outpatient palliative at discharge  Niece Ronny Bacon will pick up patient at discharge, and has been updated.  She would also like and update from Dr Mike Gip.  MD notified.   Niece to transport to follow up appointments next week  Patient will not be taking Eliquis at discharge, and will follow up with his MD at discharge    Final next level of care: Home/Self Care Barriers to Discharge: No Barriers Identified   Patient Goals and CMS Choice Patient states their goals for this hospitalization and ongoing recovery are:: to return home      Discharge Placement                       Discharge Plan and Services                                     Social Determinants of Health (SDOH) Interventions     Readmission Risk Interventions No flowsheet data found.

## 2019-08-23 NOTE — Progress Notes (Addendum)
Discharge instructions discussed with niece.  New oncology appointment added to discharge paperwork.

## 2019-08-23 NOTE — H&P (View-Only) (Signed)
Patient ID: Scott Jordan, male   DOB: Apr 02, 1951, 69 y.o.   MRN: [id number]   A long discussion regarding the management of his left pleural effusion and his need for IV access.    There is no air leak on the chest tube and drainage was less than 100 cc  I removed his chest tube without incident  Lungs are clear except for the left base Heart reg  I discussed with him the need for [facility] A Cath placement and reviewed the risks - bleeding, infection, pneumothorax and death. He would like to proceed and we will schedule this for next week.  All questions answered and dressing care discussed.

## 2019-08-25 NOTE — Progress Notes (Signed)
[facility]  [address], [address] [place], [place] [address] Phone: [phone]  Fax: [phone]   Clinic Day:  08/26/2019  Referring physician: No ref. provider found  Chief Complaint: Scott Jordan is a 69 y.o. male with extensive stage small cell lung cancer who is seen for assessment after recent hospitalization.  HPI:  The patient has a 40-pack-year smoking history. He began smoking at age 37. He had been smoking 1 pack a day since that time although in the past 2 to 3 weeks he cut back to 1 pack a week secondary to increased shortness of breath and cough.  He noted a decrease in appetite over the past 6 months.  He has lost about 15-20 pounds.  He was admitted to [facility] on 07/21/2019 with atrial flutter and pneumonia.  He had a left-sided pleural effusion.  Thoracentesis revealed a lymphocytic predominant exudative effusion.  Cytology was negative.  He was seen by Dr. Raul Del on 07/29/2019 with the recurrent effusion.  Thoracentesis on 08/02/2019 revealed similar revealed a negative cytology.  Fluid was bloody and was noted to have lymphocyte predominance on differential.  QuantiFERON gold was negative.  He presented to [facility] ER on 08/17/2019. He was admitted to [facility] from 08/17/2019 to 08/23/2019 with post-obstructive pneumonia and hyponatremia. He noted increased shortness of breath and a nonproductive cough.  He denies any fever chills or sweats.  Chest CT on 08/17/2019 revealed a 6.3 x 3.4 x 4.1 cm heterogeneous left lower lobe lung mass worrisome for malignancy.  There was moderate to marked severe left lower lobe consolidation as well as a large left pleural effusion.  There were enlarged necrotic periaortic and aortocaval lymph nodes within the abdomen and pelvis.  There was a 3.9 x 2.9 cm and a 2.2 x 1.8 cm low-attenuation soft tissue mass in between the medial aspect of the inferior vena cava and distal aspect of the descending thoracic  aorta.  He underwent left chest tube placement on 08/18/2019.  Pleural fluid cytology on 08/18/2019 was positive for small cell carcinoma.   Echo on 08/19/2019 revealed an EF of 50-55%.   Chest CT without contrast on 08/19/2019 revealed the left pleural pigtail drain.  There was a small residual left hydropneumothorax.  There was a 6.7 x 4 cm medial basilar left lower lobe lung mass suspicious for primary bronchogenic carcinoma.  There was a lingular 1.0 cm solid pulmonary nodule suspicious for ipsilateral metastasis.  There was intralobular septal thickening and patchy groundglass opacity throughout the lingula and the left lower lobe with moderate residual atelectasis of the left lung base.  A component of lymphangitic carcinomatosis was suspected.  There was ipsilateral infra hilar, subcarinal, AP window, prevascular and upper retroperitoneal adenopathy compatible with metastatic disease.  Pigtail catheter was removed with planned outpatient CXR this week.  He is scheduled for port-a-cath placement with Dr Genevive Bi on 08/29/2019.  Symptomatically, he feels "tired". He denies any abdominal symptoms. He denies any neurological symptoms.  He is able to perform his activities of daily living (ADLs); his niece notes has a hard time in the shower.  He has a shower chair. He is not on eEiquis or aspirin. He is drinking 2 Ensure per day. He describes a little left foot and ankle swelling.   He has chronic shortness of breath and feels like the fluid is beginning to build up in his lungs. Shortness of breath concerns him. He has some left lower chest/side pain which he relates  to the fluid.  He is not taking any pain medication, but would like something for pain when he needs it. He has trouble sleeping at night. He is coughing up white thick phlegm. His pulmonologist is Dr. Raul Del; Dr. Ubaldo Glassing is his cardiologist.  His abdomen is tender after pulling a muscle when trying to get out of the hospital bed.   I  discussed risk and benefits of treatment plan. The patient states that he needs transportation.    Past Medical History:  Diagnosis Date  . A-fib ([place]) 01/10/2019   pt st this was dx by Dr. Ubaldo Glassing  . Substance abuse [facility])     Past Surgical History:  Procedure Laterality Date  . BACK SURGERY      No family history on file.  Social History:  reports that he has been smoking cigarettes. He has been smoking about 0.50 packs per day. He has never used smokeless tobacco. He reports current alcohol use. He reports current drug use. The patient denies any exposure to radiation or toxins.  The patient lives in [place]. He has family friends staying with him to help care for his needs. His niece , Scott Jordan [phone]), is his medical power of attorney. The patient is accompanied by Aniceto Boss via [place] today.  Allergies: No Known Allergies  Current Medications: Current Outpatient Medications  Medication Sig Dispense Refill  . diltiazem (CARDIZEM CD) 240 MG 24 hr capsule Take 1 capsule (240 mg total) by mouth daily. 30 capsule 2  . ELIQUIS 5 MG TABS tablet Hold this blood thinner until outpatient oncology or pulmonary followup. 60 tablet   . feeding supplement, ENSURE ENLIVE, (ENSURE ENLIVE) LIQD Take 237 mLs by mouth 3 (three) times daily between meals. 237 mL 12  . losartan (COZAAR) 25 MG tablet Take 1 tablet (25 mg total) by mouth daily. 30 tablet 2   No current facility-administered medications for this visit.    Review of Systems  Constitutional: Positive for malaise/fatigue. Negative for chills, diaphoresis, fever and weight loss.       Feels "tired". Performing ADLs.  HENT: Negative for congestion, ear discharge, ear pain, hearing loss, nosebleeds, sinus pain, sore throat and tinnitus.   Eyes: Negative.  Negative for blurred vision, double vision and photophobia.  Respiratory: Positive for cough, sputum production (thick white phlegm) and shortness of breath (chronic). Negative for  hemoptysis.   Cardiovascular: Positive for leg swelling (left foot and ankle). Negative for chest pain and palpitations.  Gastrointestinal: Positive for abdominal pain. Negative for blood in stool, constipation, diarrhea, heartburn, melena, nausea and vomiting.       Drinking 2 ensure per day.  Genitourinary: Positive for flank pain (upper left side related to pleural fluid). Negative for dysuria, frequency, hematuria and urgency.  Musculoskeletal: Negative for back pain, joint pain, myalgias and neck pain.  Skin: Negative for itching and rash.  Neurological: Negative for dizziness, tingling, sensory change, weakness and headaches.  Endo/Heme/Allergies: Does not bruise/bleed easily.  Psychiatric/Behavioral: Negative for depression and memory loss. The patient is nervous/anxious (related to shortness of breath) and has insomnia.   All other systems reviewed and are negative.  Performance status (ECOG):  2  Vitals Blood pressure 115/84, pulse (!) 143, temperature 98.2 F (36.8 C), temperature source Tympanic, weight 142 lb 15.5 oz (64.8 kg), SpO2 95 %.   Physical Exam  Constitutional: He is oriented to person, place, and time. He appears well-developed and well-nourished.  Thin, chronically fatigued appearing gentleman sitting comfortably in wheelchair in  no acute distress.  HENT:  Head: Normocephalic and atraumatic.  Mouth/Throat: Oropharynx is clear and moist. No oropharyngeal exudate.  Cap.  Brown hair.  Graying goatee.  Mask.  Eyes: Pupils are equal, round, and reactive to light. Conjunctivae and EOM are normal. No scleral icterus.  Blue eyes.  Neck: No JVD present.  Cardiovascular: Normal rate, regular rhythm and normal heart sounds.  No murmur heard. Pulmonary/Chest: Effort normal and breath sounds normal. No respiratory distress. He has no wheezes. He has no rales. He exhibits no tenderness.  Decreased breath sounds at left base. Fine crackles above dullness on deep inspiration.   Abdominal: Soft. Bowel sounds are normal. He exhibits no distension and no mass. There is abdominal tenderness. There is no rebound and no guarding.  Musculoskeletal:        General: Edema (1+ left ankle) present. No tenderness. Normal range of motion.     Cervical back: Normal range of motion and neck supple.  Lymphadenopathy:       Head (right side): No preauricular, no posterior auricular and no occipital adenopathy present.       Head (left side): No preauricular, no posterior auricular and no occipital adenopathy present.    He has no cervical adenopathy.    He has no axillary adenopathy.       Right: No inguinal and no supraclavicular adenopathy present.       Left: No inguinal and no supraclavicular adenopathy present.  Neurological: He is alert and oriented to person, place, and time.  Skin: Skin is warm and dry. No rash noted. He is not diaphoretic. No erythema. No pallor.  Psychiatric: He has a normal mood and affect. His behavior is normal. Judgment and thought content normal.  Nursing note and vitals reviewed.   No visits with results within 3 Day(s) from this visit.  Latest known visit with results is:  No results displayed because visit has over 200 results.      Assessment:  Scott Jordan is a 69 y.o. male with extensive stage small cell lung cancer.  He has a >40 pack year smoking.  He presented with a recurrent left sided pleural effusion. He has undergone thoracentesis x 2 in the past month. He is s/p pigtail drain. Initial cytology x 2 was negative (lymphocytic exudative effusion). Pleural fluid cytologyon 08/18/2019 confirmed small cell lung cancer.   Chest CT without contraston 08/19/2019 revealed the left pleural pigtail drain. There was a small residual left hydropneumothorax. There was a 6.7 x 4 cm medial basilar left lower lobe lung masssuspicious for primary bronchogenic carcinoma. There was a lingular 1.0 cm solid pulmonary nodulesuspicious for  ipsilateral metastasis. There was intralobular septal thickening and patchy groundglass opacity throughout the lingula and the left lower lobe with moderate residual atelectasis of the left lung base. A component of lymphangitic carcinomatosis was suspected. There was ipsilateral infra hilar, subcarinal, AP window, prevascular and upper retroperitoneal adenopathyc/wmetastatic disease.  Head MRI on 08/21/2019 revealed no evidence of metastatic disease.  He has hyponatremia likely secondary to SIADH.  He has a significant family history of malignancy. A family member has had genetic testing (negative for Lynch syndrome).  Symptomatically, he has chronic shortness of breath.  He has dullness at the left base.  Plan: 1.   Metastatic small cell lung cancer             Clinical stage T3N2M1 (stage IV). Pleural fluid cytology confirmed small cell lung cancer. Head MRI was negative for CNS  metastasis. Review plans for outpatient PET scan this week for a baseline imaging prior to initiation of treatment.             Discuss port-a-cath placement on 08/29/2019.               Discuss plans for treatment:                         Carboplatin and etoposide + PDL-1 antibody (atezolizumab) x 4 cycles followed by [employer].                         Response rates 60%.  Median OS 12.3 months.  One year survival 51.7%.  Median PFS 5.2 months.   Potential side effects reviewed.  Information provided.   Treatment is palliative.  Patient wishes to pursue treatment.  Schedule chemotherapy class. 2.Post-obstructive pneumonia Patient treated during his hospitalization with ceftriaxone and azithromycin.             Discuss planned phone follow-up with Dr Raul Del, patient's pulmonologist, re: Augmentin to complete outpatient antibiotic course. 3.Left pleural effusion  Patient is s/p thoracentesis x 2 followed by pig tail drainage in  hospital.  Anticipate weekly thoracentesis until treatment initiated.  Patient to contact our clinic or Dr Gust Brooms clinic if thoracentesis needed. 4.   Hyponatremia Sodium improved from 120 to 131 during hospitalization. Etiology felt secondary to SIADH.  Patient to drink fluids with electrolytes and restrict free water.  Anticipate improvement with treatment. 5. Cancer-related pain  Patient notes left lower chest pain related to fluid.  Rx:  Lortab 5/325 1 tablet po q 6 hours prn pain (dis:#30). 6.   History of atrial fibrillation  Patient followed by Dr Ubaldo Glassing and was on Eliquis.  Contact Dr Ubaldo Glassing regarding plan for anticoagulation. 7. Patient requires transportation assistance via [employer].  Please schedule. 8.   Schedule PET scan this week. 9.   Chemotherapy class this week (05/21/20221). 10.   RN to call Dr Bethanne Ginger office as well as Dr Gust Brooms office. 11.   Tumor board on 08/29/2019. 12.   RTC on 09/02/2019 for MD assessment, labs (CBC with diff, CMP, Mg, CEA), and cycle #1 carboplatin, etoposide, and Tecentriq.  I discussed the assessment and treatment plan with the patient.  The patient was provided an opportunity to ask questions and all were answered.  The patient agreed with the plan and demonstrated an understanding of the instructions.  The patient was advised to call back if the symptoms worsen or if the condition fails to improve as anticipated.  I provided 30 minutes of face-to-face time during this encounter and > 50% was spent counseling as documented under my assessment and plan.  An additional 15 minutes were spent reviewing his chart (Epic and Care Everywhere) including notes, labs, and imaging studies, contacting other providers (Dr Ubaldo Glassing and Dr Raul Del) and writing his chemotherapy.    Alverna Fawley C. Mike Gip, MD, PhD    08/26/2019, 10:56 AM  I, Selena Batten, am acting as scribe for [place]. Mike Gip, MD, PhD.  I, Kayleigh Broadwell C.  Mike Gip, MD, have reviewed the above documentation for accuracy and completeness, and I agree with the above.

## 2019-08-26 ENCOUNTER — Encounter: Payer: Self-pay | Admitting: Hematology and Oncology

## 2019-08-26 ENCOUNTER — Other Ambulatory Visit: Payer: Self-pay | Admitting: Hematology and Oncology

## 2019-08-26 ENCOUNTER — Other Ambulatory Visit: Payer: Self-pay

## 2019-08-26 ENCOUNTER — Inpatient Hospital Stay: Payer: Medicare Other | Attending: Hematology and Oncology | Admitting: Hematology and Oncology

## 2019-08-26 ENCOUNTER — Encounter
Admit: 2019-08-26 | Discharge: 2019-08-26 | Disposition: A | Discharge status: Home / Self Care | Payer: Medicare Other | Attending: Cardiothoracic Surgery | Admitting: Cardiothoracic Surgery

## 2019-08-26 VITALS — BP 115/84 | HR 143 | Temp 98.2°F | Wt 143.0 lb

## 2019-08-26 DIAGNOSIS — C3432 Malignant neoplasm of lower lobe, left bronchus or lung: Secondary | ICD-10-CM | POA: Diagnosis not present

## 2019-08-26 DIAGNOSIS — J90 Pleural effusion, not elsewhere classified: Secondary | ICD-10-CM

## 2019-08-26 DIAGNOSIS — I4891 Unspecified atrial fibrillation: Secondary | ICD-10-CM | POA: Diagnosis not present

## 2019-08-26 DIAGNOSIS — G893 Neoplasm related pain (acute) (chronic): Secondary | ICD-10-CM | POA: Diagnosis not present

## 2019-08-26 DIAGNOSIS — I509 Heart failure, unspecified: Secondary | ICD-10-CM | POA: Diagnosis not present

## 2019-08-26 DIAGNOSIS — Z7189 Other specified counseling: Secondary | ICD-10-CM | POA: Diagnosis not present

## 2019-08-26 DIAGNOSIS — E871 Hypo-osmolality and hyponatremia: Secondary | ICD-10-CM | POA: Diagnosis not present

## 2019-08-26 DIAGNOSIS — Z5189 Encounter for other specified aftercare: Secondary | ICD-10-CM | POA: Insufficient documentation

## 2019-08-26 DIAGNOSIS — J189 Pneumonia, unspecified organism: Secondary | ICD-10-CM | POA: Insufficient documentation

## 2019-08-26 DIAGNOSIS — I110 Hypertensive heart disease with heart failure: Secondary | ICD-10-CM | POA: Diagnosis not present

## 2019-08-26 DIAGNOSIS — Z79899 Other long term (current) drug therapy: Secondary | ICD-10-CM | POA: Insufficient documentation

## 2019-08-26 DIAGNOSIS — C3490 Malignant neoplasm of unspecified part of unspecified bronchus or lung: Secondary | ICD-10-CM | POA: Diagnosis not present

## 2019-08-26 DIAGNOSIS — Z5112 Encounter for antineoplastic immunotherapy: Secondary | ICD-10-CM | POA: Diagnosis not present

## 2019-08-26 DIAGNOSIS — Z7901 Long term (current) use of anticoagulants: Secondary | ICD-10-CM | POA: Diagnosis not present

## 2019-08-26 DIAGNOSIS — F17210 Nicotine dependence, cigarettes, uncomplicated: Secondary | ICD-10-CM | POA: Insufficient documentation

## 2019-08-26 DIAGNOSIS — Z5111 Encounter for antineoplastic chemotherapy: Secondary | ICD-10-CM | POA: Insufficient documentation

## 2019-08-26 HISTORY — DX: Essential (primary) hypertension: I10

## 2019-08-26 HISTORY — DX: Other complications of anesthesia, initial encounter: T88.59XA

## 2019-08-26 MED ORDER — HYDROCODONE-ACETAMINOPHEN 5-325 MG PO TABS: 1.0000 | ORAL_TABLET | Freq: Four times a day (QID) | ORAL | 0 refills | Status: DC | PRN

## 2019-08-26 MED ORDER — LIDOCAINE-PRILOCAINE 2.5-2.5 % EX CREA: TOPICAL_CREAM | CUTANEOUS | 3 refills | Status: DC

## 2019-08-26 MED ORDER — ONDANSETRON HCL 8 MG PO TABS: 8.0000 mg | ORAL_TABLET | Freq: Three times a day (TID) | ORAL | 1 refills | Status: DC | PRN

## 2019-08-26 NOTE — Patient Instructions (Signed)
COVID TESTING Date:Aug 27, 2019  Testing site:  [facility] [address] Thru Hours:  1:93 am - 1:00 pm Once you are tested, you are asked to stay quarantined (avoiding public places) until after your surgery.   Your procedure is scheduled on: Aug 29, 2019 THURSDAY Report to Day Surgery on the 2nd floor of the [facility]. To find out your arrival time, please call [phone] between 1PM - 3PM on: Aug 28, 2019  REMEMBER: Instructions that are not followed completely may result in serious medical risk, up to and including death; or upon the discretion of your surgeon and anesthesiologist your surgery may need to be rescheduled.  Do not eat food after midnight the night before surgery.  No gum chewing, lozengers or hard candies.  You may however, drink CLEAR liquids up to 2 hours before you are scheduled to arrive for your surgery. Do not drink anything within 2 hours of your scheduled arrival time.  Clear liquids include: - water   Do NOT drink anything that is not on this list.  Type 1 and Type 2 diabetics should only drink water.   TAKE THESE MEDICATIONS THE MORNING OF SURGERY WITH A SIP OF WATER: DILTIAZEM  Follow recommendations from Cardiologist, Pulmonologist or PCP regarding stopping Aspirin, Coumadin, Plavix, Eliquis, Pradaxa, or Pletal. STOP ELIQUIS. PER MD  Stop Anti-inflammatories (NSAIDS) such as Advil, Aleve, Ibuprofen, Motrin, Naproxen, Naprosyn and Aspirin based products such as Excedrin, Goodys Powder, BC Powder. (May take Tylenol or Acetaminophen if needed.)  Stop ANY OVER THE COUNTER supplements until after surgery. (May continue Vitamin D, Vitamin B, and multivitamin.)  No Alcohol for 24 hours before or after surgery.  No Smoking including e-cigarettes for 24 hours prior to surgery.  No chewable tobacco products for at least 6 hours prior to surgery.  No nicotine patches on the day of surgery.  Do not use any  "recreational" drugs for at least a week prior to your surgery.  Please be advised that the combination of cocaine and anesthesia may have negative outcomes, up to and including death. If you test positive for cocaine, your surgery will be cancelled.  On the morning of surgery brush your teeth with toothpaste and water, you may rinse your mouth with mouthwash if you wish. Do not swallow any toothpaste or mouthwash.  Do not wear jewelry, make-up, hairpins, clips or nail polish.  Do not wear lotions, powders, or perfumes AFTERSHAVE OR DEODORANT  Do not shave 48 hours prior to surgery.   Contact lenses, hearing aids and dentures may not be worn into surgery.  Do not bring valuables to the hospital. [facility] is not responsible for any missing/lost belongings or valuables.   Use CHG  wipes as directed on instruction sheet.  Notify your doctor if there is any change in your medical condition (cold, fever, infection).  Wear comfortable clothing (specific to your surgery type) to the hospital.  Plan for stool softeners for home use; pain medications have a tendency to cause constipation. You can also help prevent constipation by eating foods high in fiber such as fruits and vegetables and drinking plenty of fluids as your diet allows.  After surgery, you can help prevent lung complications by doing breathing exercises.  Take deep breaths and cough every 1-2 hours. Your doctor may order a device called an Incentive Spirometer to help you take deep breaths. When coughing or sneezing, hold a pillow firmly against your incision with both hands.  This is called "splinting." Doing this helps protect your incision. It also decreases belly discomfort.  If you are being discharged the day of surgery, you will not be allowed to drive home. You will need a responsible adult (18 years or older) to drive you home and stay with you that night.   If you are taking public transportation, you will need to  have a responsible adult (18 years or older) with you. Please confirm with your physician that it is acceptable to use public transportation.   Please call the [facility] at [phone] if you have any questions about these instructions.  Visitation Policy:  Patients undergoing a surgery or procedure may have one family member or support person with them as long as that person is not COVID-19 positive or experiencing its symptoms.  That person may remain in the waiting area during the procedure.  Children under 16 years of age may have both parents or legal guardians with them during their hospital stay.   Inpatient Visitation Update:  Two designated support people may visit a patient during visiting hours 7 am to 8 pm. It must be the same two designated people for the duration of the patient stay. The visitors may come and go during the day, and there is no switching out to have different visitors. A mask must be worn at all times, including in the patient room.

## 2019-08-26 NOTE — Progress Notes (Signed)
Pharmacist Chemotherapy Monitoring - Initial Assessment    Anticipated start date: 09/02/19   Regimen:  . Are orders appropriate based on the patient's diagnosis, regimen, and cycle? Yes . Does the plan date match the patient's scheduled date? Yes . Is the sequencing of drugs appropriate? Yes . Are the premedications appropriate for the patient's regimen? Yes . Prior Authorization for treatment is: Pending o If applicable, is the correct biosimilar selected based on the patient's insurance? not applicable  Organ Function and Labs: Marland Kitchen Are dose adjustments needed based on the patient's renal function, hepatic function, or hematologic function? No . Are appropriate labs ordered prior to the start of patient's treatment? Yes . Other organ system assessment, if indicated: N/A . The following baseline labs, if indicated, have been ordered: atezolizumab: baseline TSH +/- T4  Dose Assessment: . Are the drug doses appropriate? Yes . Are the following correct: o Drug concentrations Yes o IV fluid compatible with drug Yes o Administration routes Yes o Timing of therapy Yes . If applicable, does the patient have documented access for treatment and/or plans for port-a-cath placement? not applicable . If applicable, have lifetime cumulative doses been properly documented and assessed? not applicable Lifetime Dose Tracking  No doses have been documented on this patient for the following tracked chemicals: Doxorubicin, Epirubicin, Idarubicin, Daunorubicin, Mitoxantrone, Bleomycin, Oxaliplatin, Carboplatin, Liposomal Doxorubicin  o   Toxicity Monitoring/Prevention: . The patient has the following take home antiemetics prescribed: Ondansetron, Prochlorperazine and Dexamethasone . The patient has the following take home medications prescribed: N/A . Medication allergies and previous infusion related reactions, if applicable, have been reviewed and addressed. Yes . The patient's current medication list  has been assessed for drug-drug interactions with their chemotherapy regimen. no significant drug-drug interactions were identified on review.  Order Review: . Are the treatment plan orders signed? Yes . Is the patient scheduled to see a provider prior to their treatment? Yes  I verify that I have reviewed each item in the above checklist and answered each question accordingly.  Zack Crager K 08/26/2019 1:30 PM

## 2019-08-26 NOTE — Progress Notes (Signed)
Pt in for follow up, states still short of breath, states having pain in left lung side, states "still has fluid".  Pt niece is on ipad in conference room for visit.

## 2019-08-26 NOTE — Patient Instructions (Signed)
Carboplatin injection What is this medicine? CARBOPLATIN (KAR boe pla tin) is a chemotherapy drug. It targets fast dividing cells, like cancer cells, and causes these cells to die. This medicine is used to treat ovarian cancer and many other cancers. This medicine may be used for other purposes; ask your health care provider or pharmacist if you have questions. COMMON BRAND NAME(S): Paraplatin What should I tell my health care provider before I take this medicine? They need to know if you have any of these conditions:  blood disorders  hearing problems  kidney disease  recent or ongoing radiation therapy  an unusual or allergic reaction to carboplatin, cisplatin, other chemotherapy, other medicines, foods, dyes, or preservatives  pregnant or trying to get pregnant  breast-feeding How should I use this medicine? This drug is usually given as an infusion into a vein. It is administered in a hospital or clinic by a specially trained health care professional. Talk to your pediatrician regarding the use of this medicine in children. Special care may be needed. Overdosage: If you think you have taken too much of this medicine contact a poison control center or emergency room at once. NOTE: This medicine is only for you. Do not share this medicine with others. What if I miss a dose? It is important not to miss a dose. Call your doctor or health care professional if you are unable to keep an appointment. What may interact with this medicine?  medicines for seizures  medicines to increase blood counts like filgrastim, pegfilgrastim, sargramostim  some antibiotics like amikacin, gentamicin, neomycin, streptomycin, tobramycin  vaccines Talk to your doctor or health care professional before taking any of these medicines:  acetaminophen  aspirin  ibuprofen  ketoprofen  naproxen This list may not describe all possible interactions. Give your health care provider a list of all the  medicines, herbs, non-prescription drugs, or dietary supplements you use. Also tell them if you smoke, drink alcohol, or use illegal drugs. Some items may interact with your medicine. What should I watch for while using this medicine? Your condition will be monitored carefully while you are receiving this medicine. You will need important blood work done while you are taking this medicine. This drug may make you feel generally unwell. This is not uncommon, as chemotherapy can affect healthy cells as well as cancer cells. Report any side effects. Continue your course of treatment even though you feel ill unless your doctor tells you to stop. In some cases, you may be given additional medicines to help with side effects. Follow all directions for their use. Call your doctor or health care professional for advice if you get a fever, chills or sore throat, or other symptoms of a cold or flu. Do not treat yourself. This drug decreases your body's ability to fight infections. Try to avoid being around people who are sick. This medicine may increase your risk to bruise or bleed. Call your doctor or health care professional if you notice any unusual bleeding. Be careful brushing and flossing your teeth or using a toothpick because you may get an infection or bleed more easily. If you have any dental work done, tell your dentist you are receiving this medicine. Avoid taking products that contain aspirin, acetaminophen, ibuprofen, naproxen, or ketoprofen unless instructed by your doctor. These medicines may hide a fever. Do not become pregnant while taking this medicine. Women should inform their doctor if they wish to become pregnant or think they might be pregnant. There is a  potential for serious side effects to an unborn child. Talk to your health care professional or pharmacist for more information. Do not breast-feed an infant while taking this medicine. What side effects may I notice from receiving this  medicine? Side effects that you should report to your doctor or health care professional as soon as possible:  allergic reactions like skin rash, itching or hives, swelling of the face, lips, or tongue  signs of infection - fever or chills, cough, sore throat, pain or difficulty passing urine  signs of decreased platelets or bleeding - bruising, pinpoint red spots on the skin, black, tarry stools, nosebleeds  signs of decreased red blood cells - unusually weak or tired, fainting spells, lightheadedness  breathing problems  changes in hearing  changes in vision  chest pain  high blood pressure  low blood counts - This drug may decrease the number of white blood cells, red blood cells and platelets. You may be at increased risk for infections and bleeding.  nausea and vomiting  pain, swelling, redness or irritation at the injection site  pain, tingling, numbness in the hands or feet  problems with balance, talking, walking  trouble passing urine or change in the amount of urine Side effects that usually do not require medical attention (report to your doctor or health care professional if they continue or are bothersome):  hair loss  loss of appetite  metallic taste in the mouth or changes in taste This list may not describe all possible side effects. Call your doctor for medical advice about side effects. You may report side effects to FDA at 1-800-FDA-1088. Where should I keep my medicine? This drug is given in a hospital or clinic and will not be stored at home. NOTE: This sheet is a summary. It may not cover all possible information. If you have questions about this medicine, talk to your doctor, pharmacist, or health care provider.  2020 Elsevier/Gold Standard (2007-07-03 14:38:05)   Etoposide, VP-16 injection What is this medicine? ETOPOSIDE, VP-16 (e toe POE side) is a chemotherapy drug. It is used to treat testicular cancer, lung cancer, and other cancers. This  medicine may be used for other purposes; ask your health care provider or pharmacist if you have questions. COMMON BRAND NAME(S): Etopophos, Toposar, VePesid What should I tell my health care provider before I take this medicine? They need to know if you have any of these conditions:  infection  kidney disease  liver disease  low blood counts, like low white cell, platelet, or red cell counts  an unusual or allergic reaction to etoposide, other medicines, foods, dyes, or preservatives  pregnant or trying to get pregnant  breast-feeding How should I use this medicine? This medicine is for infusion into a vein. It is administered in a hospital or clinic by a specially trained health care professional. Talk to your pediatrician regarding the use of this medicine in children. Special care may be needed. Overdosage: If you think you have taken too much of this medicine contact a poison control center or emergency room at once. NOTE: This medicine is only for you. Do not share this medicine with others. What if I miss a dose? It is important not to miss your dose. Call your doctor or health care professional if you are unable to keep an appointment. What may interact with this medicine? This medicine may interact with the following medications:  warfarin This list may not describe all possible interactions. Give your health care provider  a list of all the medicines, herbs, non-prescription drugs, or dietary supplements you use. Also tell them if you smoke, drink alcohol, or use illegal drugs. Some items may interact with your medicine. What should I watch for while using this medicine? Visit your doctor for checks on your progress. This drug may make you feel generally unwell. This is not uncommon, as chemotherapy can affect healthy cells as well as cancer cells. Report any side effects. Continue your course of treatment even though you feel ill unless your doctor tells you to stop. In some  cases, you may be given additional medicines to help with side effects. Follow all directions for their use. Call your doctor or health care professional for advice if you get a fever, chills or sore throat, or other symptoms of a cold or flu. Do not treat yourself. This drug decreases your body's ability to fight infections. Try to avoid being around people who are sick. This medicine may increase your risk to bruise or bleed. Call your doctor or health care professional if you notice any unusual bleeding. Talk to your doctor about your risk of cancer. You may be more at risk for certain types of cancers if you take this medicine. Do not become pregnant while taking this medicine or for at least 6 months after stopping it. Women should inform their doctor if they wish to become pregnant or think they might be pregnant. Women of child-bearing potential will need to have a negative pregnancy test before starting this medicine. There is a potential for serious side effects to an unborn child. Talk to your health care professional or pharmacist for more information. Do not breast-feed an infant while taking this medicine. Men must use a latex condom during sexual contact with a woman while taking this medicine and for at least 4 months after stopping it. A latex condom is needed even if you have had a vasectomy. Contact your doctor right away if your partner becomes pregnant. Do not donate sperm while taking this medicine and for at least 4 months after you stop taking this medicine. Men should inform their doctors if they wish to father a child. This medicine may lower sperm counts. What side effects may I notice from receiving this medicine? Side effects that you should report to your doctor or health care professional as soon as possible:  allergic reactions like skin rash, itching or hives, swelling of the face, lips, or tongue  low blood counts - this medicine may decrease the number of white blood  cells, red blood cells, and platelets. You may be at increased risk for infections and bleeding  nausea, vomiting  redness, blistering, peeling or loosening of the skin, including inside the mouth  signs and symptoms of infection like fever; chills; cough; sore throat; pain or trouble passing urine  signs and symptoms of low red blood cells or anemia such as unusually weak or tired; feeling faint or lightheaded; falls; breathing problems  unusual bruising or bleeding Side effects that usually do not require medical attention (report to your doctor or health care professional if they continue or are bothersome):  changes in taste  diarrhea  hair loss  loss of appetite  mouth sores This list may not describe all possible side effects. Call your doctor for medical advice about side effects. You may report side effects to FDA at 1-800-FDA-1088. Where should I keep my medicine? This drug is given in a hospital or clinic and will not be stored  at home. NOTE: This sheet is a summary. It may not cover all possible information. If you have questions about this medicine, talk to your doctor, pharmacist, or health care provider.  2020 Elsevier/Gold Standard (2018-05-23 16:57:15)   Atezolizumab injection What is this medicine? ATEZOLIZUMAB (a te zoe LIZ ue mab) is a monoclonal antibody. It is used to treat bladder cancer (urothelial cancer), liver cancer, lung cancer, breast cancer, and melanoma. This medicine may be used for other purposes; ask your health care provider or pharmacist if you have questions. COMMON BRAND NAME(S): Tecentriq What should I tell my health care provider before I take this medicine? They need to know if you have any of these conditions:  diabetes  immune system problems  infection  inflammatory bowel disease  liver disease  lung or breathing disease  lupus  nervous system problems like myasthenia gravis or Guillain-Barre syndrome  organ  transplant  an unusual or allergic reaction to atezolizumab, other medicines, foods, dyes, or preservatives  pregnant or trying to get pregnant  breast-feeding How should I use this medicine? This medicine is for infusion into a vein. It is given by a health care professional in a hospital or clinic setting. A special MedGuide will be given to you before each treatment. Be sure to read this information carefully each time. Talk to your pediatrician regarding the use of this medicine in children. Special care may be needed. Overdosage: If you think you have taken too much of this medicine contact a poison control center or emergency room at once. NOTE: This medicine is only for you. Do not share this medicine with others. What if I miss a dose? It is important not to miss your dose. Call your doctor or health care professional if you are unable to keep an appointment. What may interact with this medicine? Interactions have not been studied. This list may not describe all possible interactions. Give your health care provider a list of all the medicines, herbs, non-prescription drugs, or dietary supplements you use. Also tell them if you smoke, drink alcohol, or use illegal drugs. Some items may interact with your medicine. What should I watch for while using this medicine? Your condition will be monitored carefully while you are receiving this medicine. You may need blood work done while you are taking this medicine. Do not become pregnant while taking this medicine or for at least 5 months after stopping it. Women should inform their doctor if they wish to become pregnant or think they might be pregnant. There is a potential for serious side effects to an unborn child. Talk to your health care professional or pharmacist for more information. Do not breast-feed an infant while taking this medicine or for at least 5 months after the last dose. What side effects may I notice from receiving this  medicine? Side effects that you should report to your doctor or health care professional as soon as possible:  allergic reactions like skin rash, itching or hives, swelling of the face, lips, or tongue  black, tarry stools  bloody or watery diarrhea  breathing problems  changes in vision  chest pain or chest tightness  chills  facial flushing  fever  headache  signs and symptoms of high blood sugar such as dizziness; dry mouth; dry skin; fruity breath; nausea; stomach pain; increased hunger or thirst; increased urination  signs and symptoms of liver injury like dark yellow or brown urine; general ill feeling or flu-like symptoms; light-colored stools; loss of appetite;  nausea; right upper belly pain; unusually weak or tired; yellowing of the eyes or skin  stomach pain  trouble passing urine or change in the amount of urine Side effects that usually do not require medical attention (report to your doctor or health care professional if they continue or are bothersome):  bone pain  cough  diarrhea  joint pain  muscle pain  muscle weakness  swelling of arms or legs  tiredness  weight loss This list may not describe all possible side effects. Call your doctor for medical advice about side effects. You may report side effects to FDA at 1-800-FDA-1088. Where should I keep my medicine? This drug is given in a hospital or clinic and will not be stored at home. NOTE: This sheet is a summary. It may not cover all possible information. If you have questions about this medicine, talk to your doctor, pharmacist, or health care provider.  2020 Elsevier/Gold Standard (2018-11-16 13:11:14)

## 2019-08-26 NOTE — Telephone Encounter (Signed)
Spoke with niece, Ronny Bacon and she is aware of all dates relating to her uncle's surgical procedure for 08/29/19 and voices understanding.

## 2019-08-27 ENCOUNTER — Other Ambulatory Visit: Payer: Self-pay | Admitting: Hematology and Oncology

## 2019-08-27 ENCOUNTER — Other Ambulatory Visit
Admit: 2019-08-27 | Discharge: 2019-08-27 | Disposition: A | Discharge status: Home / Self Care | Payer: Medicare Other | Attending: Cardiothoracic Surgery | Admitting: Cardiothoracic Surgery

## 2019-08-27 ENCOUNTER — Encounter: Payer: Self-pay | Admitting: *Deleted

## 2019-08-27 ENCOUNTER — Ambulatory Visit: Payer: Medicare Other

## 2019-08-27 DIAGNOSIS — Z01812 Encounter for preprocedural laboratory examination: Secondary | ICD-10-CM | POA: Diagnosis not present

## 2019-08-27 DIAGNOSIS — J910 Malignant pleural effusion: Secondary | ICD-10-CM

## 2019-08-27 DIAGNOSIS — Z20822 Contact with and (suspected) exposure to covid-19: Secondary | ICD-10-CM | POA: Insufficient documentation

## 2019-08-27 LAB — SARS CORONAVIRUS 2 (TAT 6-24 HRS): SARS Coronavirus 2: NEGATIVE

## 2019-08-27 NOTE — Progress Notes (Signed)
  Oncology Nurse Navigator Documentation  Navigator Location: [facility] (08/27/19 1200) Referral Date to RadOnc/MedOnc: 08/21/19 (08/27/19 1200) )Navigator Encounter Type: Appt/Treatment Plan Review (08/27/19 1200)   Abnormal Finding Date: 08/17/19 (08/27/19 1200) Confirmed Diagnosis Date: 08/22/19 (08/27/19 1200)                 Treatment Phase: Pre-Tx/Tx Discussion (08/27/19 1200) Barriers/Navigation Needs: Coordination of Care (08/27/19 1200)   Interventions: None Required (08/27/19 1200)            Acuity: Level 2-Minimal Needs (1-2 Barriers Identified) (08/27/19 1200)         Time Spent with Patient: 30 (08/27/19 1200)

## 2019-08-28 ENCOUNTER — Inpatient Hospital Stay (HOSPITAL_BASED_OUTPATIENT_CLINIC_OR_DEPARTMENT_OTHER): Payer: Medicare Other | Admitting: Oncology

## 2019-08-28 ENCOUNTER — Other Ambulatory Visit: Payer: Self-pay | Admitting: Hematology and Oncology

## 2019-08-28 ENCOUNTER — Other Ambulatory Visit: Payer: Self-pay

## 2019-08-28 ENCOUNTER — Telehealth: Payer: Self-pay | Admitting: Hematology and Oncology

## 2019-08-28 ENCOUNTER — Ambulatory Visit
Admission: RE | Admit: 2019-08-28 | Discharge: 2019-08-28 | Disposition: A | Discharge status: Home / Self Care | Payer: Medicare Other | Source: Ambulatory Visit | Attending: Hematology and Oncology | Admitting: Hematology and Oncology

## 2019-08-28 VITALS — BP 121/82 | HR 69 | Temp 97.8°F | Resp 22

## 2019-08-28 DIAGNOSIS — J189 Pneumonia, unspecified organism: Secondary | ICD-10-CM | POA: Diagnosis not present

## 2019-08-28 DIAGNOSIS — J90 Pleural effusion, not elsewhere classified: Secondary | ICD-10-CM | POA: Diagnosis not present

## 2019-08-28 DIAGNOSIS — I509 Heart failure, unspecified: Secondary | ICD-10-CM | POA: Diagnosis not present

## 2019-08-28 DIAGNOSIS — J910 Malignant pleural effusion: Secondary | ICD-10-CM

## 2019-08-28 DIAGNOSIS — Z5111 Encounter for antineoplastic chemotherapy: Secondary | ICD-10-CM | POA: Diagnosis not present

## 2019-08-28 DIAGNOSIS — Z79899 Other long term (current) drug therapy: Secondary | ICD-10-CM | POA: Diagnosis not present

## 2019-08-28 DIAGNOSIS — R0602 Shortness of breath: Secondary | ICD-10-CM

## 2019-08-28 DIAGNOSIS — I110 Hypertensive heart disease with heart failure: Secondary | ICD-10-CM | POA: Diagnosis not present

## 2019-08-28 DIAGNOSIS — F17210 Nicotine dependence, cigarettes, uncomplicated: Secondary | ICD-10-CM | POA: Diagnosis not present

## 2019-08-28 DIAGNOSIS — Z5189 Encounter for other specified aftercare: Secondary | ICD-10-CM | POA: Diagnosis not present

## 2019-08-28 DIAGNOSIS — I4891 Unspecified atrial fibrillation: Secondary | ICD-10-CM | POA: Diagnosis not present

## 2019-08-28 DIAGNOSIS — G893 Neoplasm related pain (acute) (chronic): Secondary | ICD-10-CM | POA: Diagnosis not present

## 2019-08-28 DIAGNOSIS — C3490 Malignant neoplasm of unspecified part of unspecified bronchus or lung: Secondary | ICD-10-CM | POA: Diagnosis not present

## 2019-08-28 DIAGNOSIS — Z5112 Encounter for antineoplastic immunotherapy: Secondary | ICD-10-CM | POA: Diagnosis not present

## 2019-08-28 DIAGNOSIS — C3432 Malignant neoplasm of lower lobe, left bronchus or lung: Secondary | ICD-10-CM | POA: Diagnosis not present

## 2019-08-28 DIAGNOSIS — Z7901 Long term (current) use of anticoagulants: Secondary | ICD-10-CM | POA: Diagnosis not present

## 2019-08-28 DIAGNOSIS — E871 Hypo-osmolality and hyponatremia: Secondary | ICD-10-CM | POA: Diagnosis not present

## 2019-08-28 NOTE — Progress Notes (Signed)
Symptom Management Consult note [facility]  [phone] [phone]  Patient Care Team: Patient, No Pcp Per as PCP - General (General Practice) Telford Nab, RN as Registered Nurse Lequita Asal, MD as Referring Physician (Hematology and Oncology) Erby Pian, MD as Referring Physician (Specialist) Teodoro Spray, MD as Consulting Physician (Cardiology)   Name of the patient: Scott Jordan  [id number]  28-Apr-1950   Date of visit: 08/28/2019   Diagnosis- 1. Shortness of breath - EKG 12-Lead; Future   Chief complaint/ Reason for visit-small cell lung cancer  Heme/Onc history:  Oncology History  Small cell lung cancer ([place])  08/23/2019 Initial Diagnosis   Small cell lung cancer ([place])   08/28/2019 -  Chemotherapy   The patient had palonosetron (ALOXI) injection 0.25 mg, 0.25 mg, Intravenous,  Once, 0 of 4 cycles pegfilgrastim-cbqv (UDENYCA) injection 6 mg, 6 mg, Subcutaneous, Once, 0 of 4 cycles CARBOplatin (PARAPLATIN) in sodium chloride 0.9 % 100 mL chemo infusion, , Intravenous,  Once, 0 of 4 cycles etoposide (VEPESID) 180 mg in sodium chloride 0.9 % 500 mL chemo infusion, 100 mg/m2 = 180 mg, Intravenous,  Once, 0 of 4 cycles fosaprepitant (EMEND) 150 mg in sodium chloride 0.9 % 145 mL IVPB, 150 mg, Intravenous,  Once, 0 of 4 cycles atezolizumab (TECENTRIQ) 1,200 mg in sodium chloride 0.9 % 250 mL chemo infusion, 1,200 mg, Intravenous, Once, 0 of 8 cycles  for chemotherapy treatment.      Interval history-Scott Jordan is a 69 year old male with past medical history significant for A. fib, hypertension and substance abuse who was recently diagnosed with stage IV lung cancer.  He is status post several thoracentesis due to recurrent non malignant pleural effusion.  CT imaging showed a left lower lobe mass worrisome for malignancy.  Had a chest tube placed on 08/18/2019.  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    He was evaluated by Dr. Mike Gip on 08/26/2019 to review most recent head MRI and to discuss plans for treatment.  Plan is to initiate carbo/etoposide and Tecentriq on 09/02/2019.  He will have a port placed on 08/29/2019.  PET scan will be scheduled for 08/29/2019 as well.  Today, patient's niece called clinic to report that Mr. Pattison was having shortness of breath with exertion and hypoxia.  Had ultrasound this morning which was negative for recurrent pleural effusion.  He has been checking his oxygen saturations on a home oxygen machine which was reading in the mid to upper 80's.  He denies a cough or fever.  Shortness of breath is mainly with exertion.  He is not wearing oxygen. He denies chest pain.  ECOG FS:1 - Symptomatic but completely ambulatory  Review of systems- Review of Systems  Constitutional: Positive for malaise/fatigue. Negative for chills, fever and weight loss.  HENT: Negative for congestion, ear pain and tinnitus.   Eyes: Negative.  Negative for blurred vision and double vision.  Respiratory: Positive for shortness of breath. Negative for cough and sputum production.   Cardiovascular: Negative.  Negative for chest pain, palpitations and leg swelling.  Gastrointestinal: Negative.  Negative for abdominal pain, constipation, diarrhea, nausea and vomiting.  Genitourinary: Negative for dysuria, frequency and urgency.  Musculoskeletal: Negative for back pain and falls.  Skin: Negative.  Negative for rash.  Neurological: Negative.  Negative for weakness and headaches.  Endo/Heme/Allergies: Negative.  Does not bruise/bleed easily.  Psychiatric/Behavioral: Negative.  Negative for depression. The patient is not nervous/anxious and does  not have insomnia.      Current treatment-we will begin treatment with carbo/etoposide and Tecentriq on 09/02/2019.  No Known Allergies   Past Medical History:  Diagnosis Date  . A-fib ([place]) 01/10/2019   pt st this was dx by Dr. Ubaldo Glassing  . Cancer  ([place]) 2021   LUNG  . Complication of anesthesia    DIFFICULTY WAKING UP AFTER SURGERY- 20 YRS AGO  . Hypertension   . Substance abuse [facility])      Past Surgical History:  Procedure Laterality Date  . ARM DEBRIDEMENT Left    INCISION AND DEBRIDEMENT LOWER ARM -20 YRS AGO  . BACK SURGERY      Social History   Socioeconomic History  . Marital status: Single    Spouse name: Not on file  . Number of children: Not on file  . Years of education: Not on file  . Highest education level: Not on file  Occupational History  . Not on file  Tobacco Use  . Smoking status: Current Every Day Smoker    Packs/day: 0.50    Types: Cigarettes  . Smokeless tobacco: Never Used  . Tobacco comment: STATES HE STOP LAST WEEK  Substance and Sexual Activity  . Alcohol use: Yes    Comment: 84 OZ IN WEEK  . Drug use: Yes    Types: Heroin    Comment: heroin WITHIN PAST YEAR  . Sexual activity: Not on file  Other Topics Concern  . Not on file  Social History Narrative  . Not on file   Social Determinants of Health   Financial Resource Strain:   . Difficulty of Paying Living Expenses:   Food Insecurity:   . Worried About Charity fundraiser in the Last Year:   . Arboriculturist in the Last Year:   Transportation Needs:   . Film/video editor (Medical):   Marland Kitchen Lack of Transportation (Non-Medical):   Physical Activity:   . Days of Exercise per Week:   . Minutes of Exercise per Session:   Stress:   . Feeling of Stress :   Social Connections:   . Frequency of Communication with Friends and Family:   . Frequency of Social Gatherings with Friends and Family:   . Attends Religious Services:   . Active Member of Clubs or Organizations:   . Attends Archivist Meetings:   Marland Kitchen Marital Status:   Intimate Partner Violence:   . Fear of Current or Ex-Partner:   . Emotionally Abused:   Marland Kitchen Physically Abused:   . Sexually Abused:     No family history on file.   Current Outpatient  Medications:  .  diltiazem (CARDIZEM CD) 240 MG 24 hr capsule, Take 1 capsule (240 mg total) by mouth daily., Disp: 30 capsule, Rfl: 2 .  ELIQUIS 5 MG TABS tablet, Hold this blood thinner until outpatient oncology or pulmonary followup., Disp: 60 tablet, Rfl:  .  feeding supplement, ENSURE ENLIVE, (ENSURE ENLIVE) LIQD, Take 237 mLs by mouth 3 (three) times daily between meals., Disp: 237 mL, Rfl: 12 .  HYDROcodone-acetaminophen (NORCO) 5-325 MG tablet, Take 1 tablet by mouth every 6 (six) hours as needed for moderate pain., Disp: 30 tablet, Rfl: 0 .  lidocaine-prilocaine (EMLA) cream, Apply to affected area once, Disp: 30 g, Rfl: 3 .  losartan (COZAAR) 25 MG tablet, Take 1 tablet (25 mg total) by mouth daily., Disp: 30 tablet, Rfl: 2 .  ondansetron (ZOFRAN) 8 MG tablet, Take 1 tablet (  8 mg total) by mouth every 8 (eight) hours as needed for refractory nausea / vomiting. Start on day 3 after carboplatin chemo., Disp: 30 tablet, Rfl: 1  Physical exam:  Vitals:   08/28/19 1329  BP: 121/82  Pulse: 69  Resp: (!) 22  Temp: 97.8 F (36.6 C)  TempSrc: Tympanic  SpO2: 96%   Physical Exam Constitutional:      Appearance: Normal appearance.  HENT:     Head: Normocephalic and atraumatic.  Eyes:     Pupils: Pupils are equal, round, and reactive to light.  Cardiovascular:     Rate and Rhythm: Normal rate and regular rhythm.     Heart sounds: Normal heart sounds. No murmur.  Pulmonary:     Effort: Pulmonary effort is normal.     Breath sounds: Normal breath sounds. No wheezing.  Abdominal:     General: Bowel sounds are normal. There is no distension.     Palpations: Abdomen is soft.     Tenderness: There is no abdominal tenderness.  Musculoskeletal:        General: Normal range of motion.     Cervical back: Normal range of motion.  Skin:    General: Skin is warm and dry.     Findings: No rash.  Neurological:     Mental Status: He is alert and oriented to person, place, and time.   Psychiatric:        Judgment: Judgment normal.      CMP Latest Ref Rng & Units 08/23/2019  Glucose 70 - 99 mg/dL 115(H)  BUN 8 - 23 mg/dL 7(L)  Creatinine 0.61 - 1.24 mg/dL 0.69  Sodium 135 - 145 mmol/L 131(L)  Potassium 3.5 - 5.1 mmol/L 4.4  Chloride 98 - 111 mmol/L 98  CO2 22 - 32 mmol/L 27  Calcium 8.9 - 10.3 mg/dL 8.5(L)  Total Protein 6.5 - 8.1 g/dL -  Total Bilirubin 0.3 - 1.2 mg/dL -  Alkaline Phos 38 - 126 U/L -  AST 15 - 41 U/L -  ALT 0 - 44 U/L -   CBC Latest Ref Rng & Units 08/23/2019  WBC 4.0 - 10.5 K/uL 6.0  Hemoglobin 13.0 - 17.0 g/dL 12.2(L)  Hematocrit 39.0 - 52.0 % 33.7(L)  Platelets 150 - 400 K/uL 205    No images are attached to the encounter.  DG Chest 1 View  Result Date: 08/02/2019 CLINICAL DATA:  Left pleural effusion, post thoracentesis EXAM: CHEST  1 VIEW COMPARISON:  07/22/2019 FINDINGS: No pneumothorax. Trace residual left effusion suspected. Patchy consolidation/atelectasis at the left lung base as before. Right lung clear. Heart size and mediastinal contours are within normal limits. Aortic Atherosclerosis (ICD10-170.0). Visualized bones unremarkable. IMPRESSION: No pneumothorax post thoracentesis. Electronically Signed   By: Lucrezia Europe M.D.   On: 08/02/2019 15:04   DG Chest 2 View  Result Date: 08/17/2019 CLINICAL DATA:  Short of breath, recent diagnosis of pneumonia, recent thoracentesis EXAM: CHEST - 2 VIEW COMPARISON:  08/02/2019 FINDINGS: Frontal and lateral views of the chest demonstrate increased left pleural effusion and left basilar consolidation. Right chest is clear. No pneumothorax. Cardiac silhouette is obscured by the consolidation and effusion at the left lung base. No acute bony abnormalities. IMPRESSION: 1. Progressive left basilar consolidation and effusion. Electronically Signed   By: Randa Ngo M.D.   On: 08/17/2019 19:46   CT CHEST WO CONTRAST  Result Date: 08/19/2019 CLINICAL DATA:  Inpatient. Status post left pleural chest  tube placement. Follow-up left lower  lobe lung mass. EXAM: CT CHEST WITHOUT CONTRAST TECHNIQUE: Multidetector CT imaging of the chest was performed following the standard protocol without IV contrast. COMPARISON:  08/17/2019 chest CT. 08/18/2019 chest radiograph. FINDINGS: Cardiovascular: Normal heart size. No significant pericardial effusion/thickening. Three-vessel coronary atherosclerosis. Atherosclerotic nonaneurysmal thoracic aorta. Normal caliber pulmonary arteries. Mediastinum/Nodes: No discrete thyroid nodules. Unremarkable esophagus. No axillary adenopathy. Stable enlarged 1.0 cm left prevascular node (series 2/image 79) at the level of the pulmonic trunk. Stable enlarged 2.0 cm subcarinal node (series 2/image 79). Stable mild AP window adenopathy up to 1.0 cm (series 2/image 64). Stable enlarged 1.9 cm left infrahilar node (series 2/image 89). Stable enlarged 1.6 cm lower right paraesophageal node (series 2/image 128). No discrete right hilar adenopathy on these noncontrast images. Lungs/Pleura: No right pneumothorax. No right pleural effusion. Interval placement of left pleural pigtail drain terminating in the peripheral mid to lower left pleural space. Small residual left hydropneumothorax with minimal apical pneumothorax component. Irregular nodular dependent basilar left pleural thickening, unchanged. Poorly delineated 6.7 x 4.0 cm medial basilar left lower lobe lung mass (series 2/image 121). Moderate centrilobular and paraseptal emphysema. Lingular 1.0 cm solid pulmonary nodule (series 3/image 108). Interlobular septal thickening and patchy ground-glass opacity throughout lingula and left lower lobe with residual moderate atelectasis at the left lung base. Upper abdomen: Enlarged 2.0 cm gastrohepatic ligament node (series 2/image 152). Enlarged 1.7 cm left para-aortic node (series 2/image 165). Musculoskeletal: No aggressive appearing focal osseous lesions. Mild thoracic spondylosis. IMPRESSION: 1.  Interval placement of left pleural pigtail drain. Small residual left hydropneumothorax with minimal apical pneumothorax component. 2. Poorly delineated 6.7 x 4.0 cm medial basilar left lower lobe lung mass, highly suspicious for primary bronchogenic carcinoma. 3. Lingular 1.0 cm solid pulmonary nodule, suspicious for ipsilateral metastasis. 4. Interlobular septal thickening and patchy ground-glass opacity throughout the lingula and left lower lobe with residual moderate atelectasis at the left lung base. 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 5. Ipsilateral infrahilar, subcarinal, AP window, prevascular and upper retroperitoneal lymphadenopathy compatible with metastatic disease. PET-CT suggested for complete staging evaluation. 6. Three-vessel coronary atherosclerosis. 7. Aortic Atherosclerosis (ICD10-I70.0) and Emphysema (ICD10-J43.9). Electronically Signed   By: Ilona Sorrel M.D.   On: 08/19/2019 08:08   CT Chest W Contrast  Result Date: 08/17/2019 CLINICAL DATA:  Shortness of breath. EXAM: CT CHEST WITH CONTRAST TECHNIQUE: Multidetector CT imaging of the chest was performed during intravenous contrast administration. CONTRAST:  44mL OMNIPAQUE IOHEXOL 300 MG/ML  SOLN COMPARISON:  June 11, 2004 FINDINGS: Cardiovascular: There is mild calcification of the aortic arch. The pulmonary arteries are limited in evaluation secondary to suboptimal opacification with intravenous contrast. No intraluminal filling defects are identified. Normal heart size. No pericardial effusion. Marked severity coronary artery calcification is seen Mediastinum/Nodes: Multiple subcentimeter pretracheal lymph nodes are seen. Lungs/Pleura: There is mild emphysematous lung disease. Moderate to marked severity consolidation is seen throughout the left lower lobe. A 6.3 cm x 3.4 cm x 4.1 cm heterogeneous low-attenuation soft tissue mass is seen within the posteromedial aspect of the left lung base (axial CT image 144, CT series  number 2/coronal re-formatted image 97, CT series number 5). There is a large left pleural effusion. No pneumothorax is identified. Upper Abdomen: Enlarged, necrotic lymph nodes are seen within the para-aortic and aortocaval regions. A 3.9 cm x 2.9 cm and 2.2 cm x 1.8 cm heterogeneous low-attenuation soft tissue masses are seen in between the medial aspect of the inferior vena cava and distal aspect of the descending thoracic aorta (  axial CT images 137 through 135, CT series number 2). A 1.9 cm x 1.6 cm cystic appearing areas seen within the anterior aspect of the mid right kidney. Musculoskeletal: No chest wall abnormality. No acute or significant osseous findings. IMPRESSION: 1. 6.3 cm x 3.4 cm x 4.1 cm heterogeneous left lower lobe lung mass, worrisome for malignancy. 2. Moderate to marked severity left lower lobe consolidation. 3. Large left pleural effusion. 4. Enlarged, necrotic para-aortic and aortocaval lymph nodes within the chest and abdomen, consistent with metastatic disease. Aortic Atherosclerosis (ICD10-I70.0). Electronically Signed   By: Virgina Norfolk M.D.   On: 08/17/2019 21:14   MR BRAIN W WO CONTRAST  Result Date: 08/21/2019 CLINICAL DATA:  Cancer of unknown primary, staging. EXAM: MRI HEAD WITHOUT AND WITH CONTRAST TECHNIQUE: Multiplanar, multiecho pulse sequences of the brain and surrounding structures were obtained without and with intravenous contrast. CONTRAST:  8mL GADAVIST GADOBUTROL 1 MMOL/ML IV SOLN COMPARISON:  No pertinent prior studies available for comparison. FINDINGS: Brain: Mild intermittent motion degradation. Moderate scattered T2/FLAIR hyperintensity within the cerebral white matter is nonspecific, but consistent with chronic small vessel ischemic disease. Chronic lacunar infarcts within the left corona radiata and left thalamus. Cerebral volume is normal. No abnormal intracranial enhancement is demonstrated to suggest intracranial metastatic disease. There is no acute  infarct. No evidence of intracranial mass. No chronic intracranial blood products. No extra-axial fluid collection. No midline shift. Vascular: Expected proximal arterial flow voids. Skull and upper cervical spine: No focal marrow lesion. Sinuses/Orbits: Visualized orbits show no acute finding. Small left maxillary sinus mucous retention cyst. Mild ethmoid sinus mucosal thickening. Trace right mastoid effusion. IMPRESSION: 1. Mildly motion degraded examination. 2. No evidence of intracranial metastatic disease. 3. Moderate chronic small vessel ischemic changes within the cerebral white matter. Chronic lacunar infarcts within the left corona radiata and left thalamus. 4. Mild ethmoid sinus mucosal thickening. Small left maxillary sinus mucous retention cysts. 5. Trace right mastoid effusion Electronically Signed   By: Kellie Simmering DO   On: 08/21/2019 14:49   [place] CHEST (PLEURAL EFFUSION)  Result Date: 08/28/2019 CLINICAL DATA:  LUNG CANCER, PLEURAL EFFUSION, RECENT LEFT PLEURX CATHETER REMOVAL EXAM: CHEST ULTRASOUND COMPARISON:  08/19/2019 FINDINGS: ultrasound performed of the left chest. only a trace left effusion is noted with left lower lobe consolidation evident. there is not enough fluid to warrant therapeutic thoracentesis. procedure not performed. IMPRESSION: trace left pleural effusion. left lower lobe consolidation. Electronically Signed   By: Jerilynn Mages.  Shick M.D.   On: 08/28/2019 10:29   DG Chest Port 1 View  Result Date: 08/18/2019 CLINICAL DATA:  Left-sided chest tube placement. EXAM: PORTABLE CHEST 1 VIEW COMPARISON:  Aug 17, 2019 FINDINGS: Since the prior study there is been interval placement of a left-sided chest tube. Its distal tip is seen overlying the lateral aspect of the mid left lung. There is a large, stable left pleural effusion. No pneumothorax is identified. The heart size and mediastinal contours are within normal limits. Degenerative changes seen within the mid to lower thoracic spine.  IMPRESSION: 1. Interval placement of a left-sided chest tube. 2. Large, stable left pleural effusion. 3. No pneumothorax. Electronically Signed   By: Virgina Norfolk M.D.   On: 08/18/2019 16:36   ECHOCARDIOGRAM COMPLETE  Result Date: 08/19/2019    ECHOCARDIOGRAM REPORT   Patient Name:   SAVVA BEAMER Date of Exam: 08/19/2019 Medical Rec #:  [id number]    Height:       74.0 in Accession #:  [id number]   Weight:       138.7 lb Date of Birth:  1950-12-20     BSA:          1.860 m Patient Age:    50 years     BP:           136/74 mmHg Patient Gender: M            HR:           97 bpm. Exam Location:  [facility] Procedure: 2D Echo, Color Doppler and Cardiac Doppler Indications:     Dyspnea 786.09  History:         Patient has prior history of Echocardiogram examinations, most                  recent 01/14/2019. Arrythmias:Atrial Fibrillation.  Sonographer:     Sherrie Sport RDCS (AE) Referring Phys:  [id number] Tyler Pita Diagnosing Phys: Kathlyn Sacramento MD  Sonographer Comments: No apical window. Large bandage in apical space. IMPRESSIONS  1. Left ventricular ejection fraction, by estimation, is 50 to 55%. The left ventricle has low normal function. Left ventricular endocardial border not optimally defined to evaluate regional wall motion. There is moderate left ventricular hypertrophy. Left ventricular diastolic function could not be evaluated.  2. Right ventricular systolic function is normal. The right ventricular size is normal. Tricuspid regurgitation signal is inadequate for assessing PA pressure.  3. The mitral valve is normal in structure. No evidence of mitral valve regurgitation. No evidence of mitral stenosis.  4. The aortic valve is normal in structure. Aortic valve regurgitation is not visualized. Mild aortic valve sclerosis is present, with no evidence of aortic valve stenosis.  5. Very limited study due to lack of apical images. FINDINGS  Left Ventricle: Left ventricular ejection fraction, by estimation, is 50  to 55%. The left ventricle has low normal function. Left ventricular endocardial border not optimally defined to evaluate regional wall motion. The left ventricular internal cavity  size was normal in size. There is moderate left ventricular hypertrophy. Left ventricular diastolic function could not be evaluated. Right Ventricle: The right ventricular size is normal. No increase in right ventricular wall thickness. Right ventricular systolic function is normal. Tricuspid regurgitation signal is inadequate for assessing PA pressure. The tricuspid regurgitant velocity is 2.52 m/s, and with an assumed right atrial pressure of 10 mmHg, the estimated right ventricular systolic pressure is 17.5 mmHg. Left Atrium: Left atrial size was normal in size. Right Atrium: Right atrial size was normal in size. Pericardium: There is no evidence of pericardial effusion. Mitral Valve: The mitral valve is normal in structure. Normal mobility of the mitral valve leaflets. No evidence of mitral valve regurgitation. No evidence of mitral valve stenosis. Tricuspid Valve: The tricuspid valve is normal in structure. Tricuspid valve regurgitation is trivial. No evidence of tricuspid stenosis. Aortic Valve: The aortic valve is normal in structure. Aortic valve regurgitation is not visualized. Mild aortic valve sclerosis is present, with no evidence of aortic valve stenosis. Pulmonic Valve: The pulmonic valve was normal in structure. Pulmonic valve regurgitation is not visualized. No evidence of pulmonic stenosis. Aorta: The aortic root is normal in size and structure. Venous: The inferior vena cava was not well visualized. IAS/Shunts: No atrial level shunt detected by color flow Doppler.  LEFT VENTRICLE PLAX 2D LVIDd:         3.12 cm LVIDs:         2.21 cm LV PW:  1.21 cm LV IVS:        1.38 cm LVOT diam:     2.00 cm LVOT Area:     3.14 cm  LEFT ATRIUM         Index LA diam:    3.40 cm 1.83 cm/m                        PULMONIC VALVE  AORTA                 PV Vmax:        0.38 m/s Ao Root diam: 3.10 cm PV Peak grad:   0.6 mmHg                       RVOT Peak grad: 2 mmHg  TRICUSPID VALVE TR Peak grad:   25.4 mmHg TR Vmax:        252.00 cm/s  SHUNTS Systemic Diam: 2.00 cm Kathlyn Sacramento MD Electronically signed by Kathlyn Sacramento MD Signature Date/Time: 08/19/2019/12:17:53 PM    Final    US THORACENTESIS ASP PLEURAL SPACE W/IMG GUIDE  Result Date: 08/02/2019 INDICATION: Patient with prior history of substance abuse, CHF, pneumonia, dyspnea, left pleural effusion; request received for diagnostic and therapeutic left thoracentesis. EXAM: ULTRASOUND GUIDED DIAGNOSTIC AND THERAPEUTIC LEFT THORACENTESIS MEDICATIONS: None COMPLICATIONS: None immediate. PROCEDURE: An ultrasound guided thoracentesis was thoroughly discussed with the patient and questions answered. The benefits, risks, alternatives and complications were also discussed. The patient understands and wishes to proceed with the procedure. Written consent was obtained. Ultrasound was performed to localize and mark an adequate pocket of fluid in the left chest. The area was then prepped and draped in the normal sterile fashion. 1% Lidocaine was used for local anesthesia. Under ultrasound guidance a 6 Fr Safe-T-Centesis catheter was introduced. Thoracentesis was performed. The catheter was removed and a dressing applied. FINDINGS: A total of approximately 900 cc of blood-tinged fluid was removed. Samples were sent to the laboratory as requested by the clinical team. IMPRESSION: Successful ultrasound guided diagnostic and therapeutic left thoracentesis yielding 900 cc of pleural fluid. Follow-up chest x-ray revealed no pneumothorax. Read by: Rowe Robert, PA-C Electronically Signed   By: Lucrezia Europe M.D.   On: 08/02/2019 15:08    Assessment and plan- Patient is a 69 y.o. male who presents to symptom management for concerns of shortness of breath and subjective hypoxia.  On assessment,  patient's vital signs are stable.  Oxygen saturation maintained at 95 to 96% even with ambulation.  Ultrasound this morning was negative for pleural effusion.  Given his history of A. fib/a flutter will get EKG.  EKG reveals rate controlled a flutter.  He is currently not on anticoagulation.  He is scheduled to have his port placed tomorrow.  Dr. Ubaldo Glassing is his cardiologist.   Spoke with Dr. Ubaldo Glassing about his EKG. per Dr. Ubaldo Glassing, patient okay to remain off of Eliquis until his port is placed tomorrow.  Given he is rate controlled and is maintaining his oxygen saturations, he can remain outpatient and restart anticoagulation tomorrow.  Unclear etiology of shortness of breath with exertion.  Given he is not hypoxic, denies chest pain and no shortness of breath at rest less likely a pulmonary embolus but related to malignancy and abnormal heart rhythm.  If he develops any worsening of the symptoms, patient to report directly to the emergency department for further evaluation.  Plan: Stat EKG- Rate controlled Aflutter O2  saturations: maintained 95-96% on RA Spoke to Dr. Ubaldo Glassing in cardiology- ok to stay outpatient VSS  Disposition: RTC tomorrow for [facility] placement.  Mr. Derego will be directed by cardiology to restart his anticoagulation. RTC on 09/02/2019 for lab work, MD assessment and cycle 1 of chemotherapy.  Visit Diagnosis 1. Shortness of breath     Patient expressed understanding and was in agreement with this plan. He also understands that He can call clinic at any time with any questions, concerns, or complaints.   Greater than 50% was spent in counseling and coordination of care with this patient including but not limited to discussion of the relevant topics above (See A&P) including, but not limited to diagnosis and management of acute and chronic medical conditions.   Thank you for allowing me to participate in the care of this very pleasant patient.    Jacquelin Hawking, NP [place] [facility] [address] Cell - [phone] Pager- [phone] 08/28/2019 1:32 PM

## 2019-08-28 NOTE — Telephone Encounter (Signed)
Ronny Bacon (Niece) called. He went to get fluid drained off his lungs but Xray showed he didn't have any fluid. His SATS have dropped to 80 this morning and his niece is very concerned.I called Hassan Rowan (Triage) and she conferenced in [place]. [place] Tye Savoy is going to give him a call.

## 2019-08-28 NOTE — Telephone Encounter (Signed)
  Please follow-up regarding this patient.    He should be seen in symptom management if his oxygen sats are in the 80s.   Lequita Asal, MD

## 2019-08-29 ENCOUNTER — Ambulatory Visit: Payer: Medicare Other

## 2019-08-29 ENCOUNTER — Ambulatory Visit
Admission: RE | Admit: 2019-08-29 | Discharge: 2019-08-29 | Disposition: A | Discharge status: Home / Self Care | Payer: Medicare Other | Source: Home / Self Care | Attending: Cardiothoracic Surgery | Admitting: Cardiothoracic Surgery

## 2019-08-29 ENCOUNTER — Encounter
Admission: RE | Admit: 2019-08-29 | Discharge: 2019-08-29 | Disposition: A | Discharge status: Home / Self Care | Payer: Medicare Other | Source: Ambulatory Visit | Attending: Hematology and Oncology | Admitting: Hematology and Oncology

## 2019-08-29 ENCOUNTER — Ambulatory Visit: Payer: Medicare Other | Admitting: Anesthesiology

## 2019-08-29 ENCOUNTER — Other Ambulatory Visit: Payer: Self-pay

## 2019-08-29 ENCOUNTER — Encounter
Admission: RE | Disposition: A | Discharge status: Home / Self Care | Payer: Self-pay | Source: Home / Self Care | Attending: Cardiothoracic Surgery

## 2019-08-29 ENCOUNTER — Ambulatory Visit
Admission: RE | Admit: 2019-08-29 | Discharge: 2019-08-29 | Disposition: A | Discharge status: Home / Self Care | Payer: Medicare Other | Attending: Cardiothoracic Surgery | Admitting: Cardiothoracic Surgery

## 2019-08-29 ENCOUNTER — Encounter: Payer: Self-pay | Admitting: Cardiothoracic Surgery

## 2019-08-29 ENCOUNTER — Other Ambulatory Visit: Payer: Medicare Other

## 2019-08-29 DIAGNOSIS — Z87891 Personal history of nicotine dependence: Secondary | ICD-10-CM | POA: Diagnosis not present

## 2019-08-29 DIAGNOSIS — J90 Pleural effusion, not elsewhere classified: Secondary | ICD-10-CM

## 2019-08-29 DIAGNOSIS — I10 Essential (primary) hypertension: Secondary | ICD-10-CM | POA: Diagnosis not present

## 2019-08-29 DIAGNOSIS — C3432 Malignant neoplasm of lower lobe, left bronchus or lung: Secondary | ICD-10-CM | POA: Insufficient documentation

## 2019-08-29 DIAGNOSIS — J910 Malignant pleural effusion: Secondary | ICD-10-CM | POA: Insufficient documentation

## 2019-08-29 DIAGNOSIS — I4892 Unspecified atrial flutter: Secondary | ICD-10-CM | POA: Insufficient documentation

## 2019-08-29 DIAGNOSIS — E222 Syndrome of inappropriate secretion of antidiuretic hormone: Secondary | ICD-10-CM

## 2019-08-29 DIAGNOSIS — Z01810 Encounter for preprocedural cardiovascular examination: Secondary | ICD-10-CM | POA: Diagnosis not present

## 2019-08-29 DIAGNOSIS — I4891 Unspecified atrial fibrillation: Secondary | ICD-10-CM | POA: Insufficient documentation

## 2019-08-29 DIAGNOSIS — Z79899 Other long term (current) drug therapy: Secondary | ICD-10-CM | POA: Diagnosis not present

## 2019-08-29 DIAGNOSIS — E871 Hypo-osmolality and hyponatremia: Secondary | ICD-10-CM | POA: Diagnosis not present

## 2019-08-29 DIAGNOSIS — Z452 Encounter for adjustment and management of vascular access device: Secondary | ICD-10-CM | POA: Diagnosis not present

## 2019-08-29 DIAGNOSIS — C3490 Malignant neoplasm of unspecified part of unspecified bronchus or lung: Secondary | ICD-10-CM | POA: Diagnosis not present

## 2019-08-29 HISTORY — PX: PORTACATH PLACEMENT: SHX2246

## 2019-08-29 LAB — URINE DRUG SCREEN, QUALITATIVE (ARMC ONLY)
Amphetamines, Ur Screen: NOT DETECTED
Barbiturates, Ur Screen: NOT DETECTED
Benzodiazepine, Ur Scrn: NOT DETECTED
Cannabinoid 50 Ng, Ur ~~LOC~~: NOT DETECTED
Cocaine Metabolite,Ur ~~LOC~~: NOT DETECTED
MDMA (Ecstasy)Ur Screen: NOT DETECTED
Methadone Scn, Ur: NOT DETECTED
Opiate, Ur Screen: POSITIVE — AB
Phencyclidine (PCP) Ur S: NOT DETECTED
Tricyclic, Ur Screen: NOT DETECTED

## 2019-08-29 LAB — GLUCOSE, CAPILLARY: Glucose-Capillary: 100 mg/dL — ABNORMAL HIGH (ref 70–99)

## 2019-08-29 SURGERY — INSERTION, TUNNELED CENTRAL VENOUS DEVICE, WITH PORT
Anesthesia: General | Laterality: Left

## 2019-08-29 MED ORDER — LIDOCAINE HCL 1 % IJ SOLN
INTRAMUSCULAR | Status: DC | PRN
  Administered 2019-08-29: 12 mL

## 2019-08-29 MED ORDER — CHLORHEXIDINE GLUCONATE CLOTH 2 % EX PADS: 6.0000 | MEDICATED_PAD | Freq: Once | CUTANEOUS | Status: DC

## 2019-08-29 MED ORDER — FENTANYL CITRATE (PF) 100 MCG/2ML IJ SOLN
INTRAMUSCULAR | Status: AC
  Filled 2019-08-29: qty 2

## 2019-08-29 MED ORDER — MIDAZOLAM HCL 2 MG/2ML IJ SOLN
INTRAMUSCULAR | Status: DC | PRN
  Administered 2019-08-29: 2 mg via INTRAVENOUS

## 2019-08-29 MED ORDER — FENTANYL CITRATE (PF) 100 MCG/2ML IJ SOLN: 25.0000 ug | INTRAMUSCULAR | Status: DC | PRN

## 2019-08-29 MED ORDER — EPHEDRINE SULFATE 50 MG/ML IJ SOLN
INTRAMUSCULAR | Status: DC | PRN
  Administered 2019-08-29: 5 mg via INTRAVENOUS
  Administered 2019-08-29: 10 mg via INTRAVENOUS

## 2019-08-29 MED ORDER — ONDANSETRON HCL 4 MG/2ML IJ SOLN
INTRAMUSCULAR | Status: AC
  Filled 2019-08-29: qty 2

## 2019-08-29 MED ORDER — DEXAMETHASONE SODIUM PHOSPHATE 10 MG/ML IJ SOLN
INTRAMUSCULAR | Status: DC | PRN
  Administered 2019-08-29: 10 mg via INTRAVENOUS

## 2019-08-29 MED ORDER — FAMOTIDINE 20 MG PO TABS: 20.0000 mg | ORAL_TABLET | Freq: Once | ORAL | Status: AC

## 2019-08-29 MED ORDER — LIDOCAINE HCL (PF) 1 % IJ SOLN
INTRAMUSCULAR | Status: AC
  Filled 2019-08-29: qty 30

## 2019-08-29 MED ORDER — FLUDEOXYGLUCOSE F - 18 (FDG) INJECTION
7.8600 | Freq: Once | INTRAVENOUS | Status: AC | PRN
  Administered 2019-08-29: 7.86 via INTRAVENOUS

## 2019-08-29 MED ORDER — ONDANSETRON HCL 4 MG/2ML IJ SOLN
INTRAMUSCULAR | Status: DC | PRN
  Administered 2019-08-29: 4 mg via INTRAVENOUS

## 2019-08-29 MED ORDER — LIDOCAINE HCL (CARDIAC) PF 100 MG/5ML IV SOSY
PREFILLED_SYRINGE | INTRAVENOUS | Status: DC | PRN
  Administered 2019-08-29: 50 mg via INTRAVENOUS

## 2019-08-29 MED ORDER — LACTATED RINGERS IV SOLN: INTRAVENOUS | Status: DC

## 2019-08-29 MED ORDER — FAMOTIDINE 20 MG PO TABS
ORAL_TABLET | ORAL | Status: AC
  Administered 2019-08-29: 20 mg via ORAL
  Filled 2019-08-29: qty 1

## 2019-08-29 MED ORDER — PROPOFOL 10 MG/ML IV BOLUS
INTRAVENOUS | Status: DC | PRN
  Administered 2019-08-29: 200 mg via INTRAVENOUS

## 2019-08-29 MED ORDER — OXYCODONE HCL 5 MG/5ML PO SOLN: 5.0000 mg | Freq: Once | ORAL | Status: DC | PRN

## 2019-08-29 MED ORDER — CEFAZOLIN SODIUM-DEXTROSE 2-4 GM/100ML-% IV SOLN
INTRAVENOUS | Status: AC
  Filled 2019-08-29: qty 100

## 2019-08-29 MED ORDER — DEXAMETHASONE SODIUM PHOSPHATE 10 MG/ML IJ SOLN
INTRAMUSCULAR | Status: AC
  Filled 2019-08-29: qty 1

## 2019-08-29 MED ORDER — HEPARIN SODIUM (PORCINE) 5000 UNIT/ML IJ SOLN
INTRAMUSCULAR | Status: AC
  Filled 2019-08-29: qty 1

## 2019-08-29 MED ORDER — SODIUM CHLORIDE (PF) 0.9 % IJ SOLN
INTRAMUSCULAR | Status: AC
  Filled 2019-08-29: qty 50

## 2019-08-29 MED ORDER — PROPOFOL 10 MG/ML IV BOLUS
INTRAVENOUS | Status: AC
  Filled 2019-08-29: qty 20

## 2019-08-29 MED ORDER — LIDOCAINE HCL (PF) 2 % IJ SOLN
INTRAMUSCULAR | Status: AC
  Filled 2019-08-29: qty 5

## 2019-08-29 MED ORDER — CEFAZOLIN SODIUM-DEXTROSE 2-4 GM/100ML-% IV SOLN
2.0000 g | INTRAVENOUS | Status: AC
  Administered 2019-08-29: 2 g via INTRAVENOUS

## 2019-08-29 MED ORDER — PHENYLEPHRINE HCL (PRESSORS) 10 MG/ML IV SOLN
INTRAVENOUS | Status: DC | PRN
  Administered 2019-08-29 (×4): 100 ug via INTRAVENOUS
  Administered 2019-08-29 (×2): 150 ug via INTRAVENOUS
  Administered 2019-08-29 (×3): 100 ug via INTRAVENOUS

## 2019-08-29 MED ORDER — FENTANYL CITRATE (PF) 100 MCG/2ML IJ SOLN
INTRAMUSCULAR | Status: DC | PRN
  Administered 2019-08-29: 25 ug via INTRAVENOUS

## 2019-08-29 MED ORDER — SODIUM CHLORIDE 0.9 % IV SOLN
INTRAVENOUS | Status: DC | PRN
  Administered 2019-08-29: 10 mL via INTRAMUSCULAR

## 2019-08-29 MED ORDER — MIDAZOLAM HCL 2 MG/2ML IJ SOLN
INTRAMUSCULAR | Status: AC
  Filled 2019-08-29: qty 2

## 2019-08-29 MED ORDER — OXYCODONE HCL 5 MG PO TABS: 5.0000 mg | ORAL_TABLET | Freq: Once | ORAL | Status: DC | PRN

## 2019-08-29 SURGICAL SUPPLY — 48 items
BAG DECANTER FOR FLEXI CONT (MISCELLANEOUS) ×2 IMPLANT
BLADE SURG 15 STRL LF DISP TIS (BLADE) ×1 IMPLANT
BLADE SURG 15 STRL SS (BLADE) ×1
BLADE SURG SZ11 CARB STEEL (BLADE) ×2 IMPLANT
CANISTER SUCT 1200ML W/VALVE (MISCELLANEOUS) ×2 IMPLANT
CHLORAPREP W/TINT 26 (MISCELLANEOUS) ×2 IMPLANT
COVER LIGHT HANDLE STERIS (MISCELLANEOUS) ×4 IMPLANT
DRAIN CHEST DRY SUCT SGL (MISCELLANEOUS) ×2 IMPLANT
DRAPE C-ARM XRAY 36X54 (DRAPES) ×2 IMPLANT
DRSG TEGADERM 2-3/8X2-3/4 SM (GAUZE/BANDAGES/DRESSINGS) ×2 IMPLANT
DRSG TEGADERM 4X4.75 (GAUZE/BANDAGES/DRESSINGS) ×2 IMPLANT
DRSG TELFA 4X3 1S NADH ST (GAUZE/BANDAGES/DRESSINGS) ×2 IMPLANT
DRSG VASELINE 3X18 (GAUZE/BANDAGES/DRESSINGS) ×2 IMPLANT
ELECT CAUTERY BLADE TIP 2.5 (TIP) ×2
ELECT REM PT RETURN 9FT ADLT (ELECTROSURGICAL) ×2
ELECTRODE CAUTERY BLDE TIP 2.5 (TIP) ×1 IMPLANT
ELECTRODE REM PT RTRN 9FT ADLT (ELECTROSURGICAL) ×1 IMPLANT
GAUZE 4X4 16PLY RFD (DISPOSABLE) ×2 IMPLANT
GAUZE SPONGE 4X4 12PLY STRL (GAUZE/BANDAGES/DRESSINGS) ×2 IMPLANT
GLOVE SURG SYN 7.5  E (GLOVE) ×1
GLOVE SURG SYN 7.5 E (GLOVE) ×1 IMPLANT
GOWN STRL REUS W/ TWL LRG LVL3 (GOWN DISPOSABLE) ×2 IMPLANT
GOWN STRL REUS W/TWL LRG LVL3 (GOWN DISPOSABLE) ×2
IV NS 500ML (IV SOLUTION) ×1
IV NS 500ML BAXH (IV SOLUTION) ×1 IMPLANT
KIT TURNOVER KIT A (KITS) ×2 IMPLANT
LABEL OR SOLS (LABEL) ×2 IMPLANT
MARKER SKIN DUAL TIP RULER LAB (MISCELLANEOUS) ×2 IMPLANT
NEEDLE FILTER BLUNT 18X 1/2SAF (NEEDLE) ×1
NEEDLE FILTER BLUNT 18X1 1/2 (NEEDLE) ×1 IMPLANT
NEEDLE HYPO 22GX1.5 SAFETY (NEEDLE) ×4 IMPLANT
NS IRRIG 500ML POUR BTL (IV SOLUTION) ×2 IMPLANT
PACK PORT-A-CATH (MISCELLANEOUS) ×2 IMPLANT
SET CHEST THAL QUICK 24 (MISCELLANEOUS) ×2 IMPLANT
STRIP CLOSURE SKIN 1/4X4 (GAUZE/BANDAGES/DRESSINGS) IMPLANT
SUT ETHILON 4-0 (SUTURE) ×2
SUT ETHILON 4-0 FS2 18XMFL BLK (SUTURE) ×2
SUT PROLENE 2 0 SH DA (SUTURE) ×4 IMPLANT
SUT VIC AB 2-0 SH 27 (SUTURE) ×1
SUT VIC AB 2-0 SH 27XBRD (SUTURE) ×1 IMPLANT
SUT VIC AB 3-0 SH 27 (SUTURE) ×1
SUT VIC AB 3-0 SH 27X BRD (SUTURE) ×1 IMPLANT
SUTURE ETHLN 4-0 FS2 18XMF BLK (SUTURE) ×2 IMPLANT
SYR 10ML SLIP (SYRINGE) ×2 IMPLANT
SYR 3ML LL SCALE MARK (SYRINGE) ×2 IMPLANT
TAPE CLOTH 3X10 WHT NS LF (GAUZE/BANDAGES/DRESSINGS) IMPLANT
TAPE TRANSPORE STRL 2 31045 (GAUZE/BANDAGES/DRESSINGS) ×2 IMPLANT
TRAY WAYNE PNEUMOTHORAX 14X18 (TRAY / TRAY PROCEDURE) ×2 IMPLANT

## 2019-08-29 NOTE — Transfer of Care (Signed)
Immediate Anesthesia Transfer of Care Note  Patient: Scott Jordan  Procedure(s) Performed: INSERTION PORT-A-CATH (Left )  Patient Location: PACU  Anesthesia Type:General  Level of Consciousness: awake and alert   Airway & Oxygen Therapy: Patient Spontanous Breathing and Patient connected to face mask oxygen  Post-op Assessment: Report given to RN, Post -op Vital signs reviewed and stable and Patient moving all extremities  Post vital signs: Reviewed and stable  Last Vitals:  Vitals Value Taken Time  BP 116/76 08/29/19 1354  Temp    Pulse 79 08/29/19 1357  Resp 17 08/29/19 1357  SpO2 99 % 08/29/19 1357  Vitals shown include unvalidated device data.  Last Pain:  Vitals:   08/29/19 1002  TempSrc: Temporal  PainSc: 0-No pain         Complications: No apparent anesthesia complications

## 2019-08-29 NOTE — Progress Notes (Signed)
[facility]  [address] [place], [place] [address] Phone: [phone]  Fax: [phone]   Clinic Day:  09/02/2019  Referring physician: No ref. provider found  Chief Complaint: Scott Jordan is a 69 y.o. male with extensive stage small cell lung cancer who is seen for assessment prior to cycle #1 carboplatin, etoposide, and Tecentriq.   HPI:  The patient was last seen in the medical oncology clinic on 08/26/2019 for an initial assessment after his hospitalization. At that time, he had chronic shortness of breath. He had dullness at the left base. We discussed a future visit with Dr. Ubaldo Glassing regarding plan for anticoagulation.  PET scan was scheduled.  We discussed plan for chemotherapy.    He was seen in the symptom management clinic on 08/28/2019 by Faythe Casa, NP. Patient's oxygen sats were 95-96%.  EKG revealed atrial flutter.  Imaging revealed trace left pleural effusion and left lower lobe consolidation.    PET scan on 05/20/20201 revealed markedly hypermetabolic left lower lobe pulmonary lesion (SUV 7.9) c/w the patient's known malignancy. There was associated hypermetabolic nodal metastases in the mediastinum and left hilum with metastatic involvement of left pleural space. Additional sites included upper abdominal hypermetabolic lymphadenopathy and scattered hypermetabolic bone metastases (left acetabulum, sacrum, and L5). There was interval decrease in left pleural effusion with areas of loculation.  Port-a-cath was placed on 08/29/2019.  He attended chemotherapy class on 08/30/2019.  During the interim, he was doing "alright". He states that his port placement and chemotherapy class went well. Reports he is having left ankle edema and pain starting 2-3 days ago. Pain is primarily in left hip radiating to thigh area. His breathing comes and goes. He is eating well. He felt nauseous the other day trying the get his phlegm out. He is coughing up  white thick phlegm. He has nausea medication that he is taking as needed.    Physical exam revealed thick thrush. The patient notes that his throat is sore when her swallows. His sodium was 129 today, I directed the patient to drink more fluids containing electrolytes. Patient consented to treatment today. Patient notes needing transportation for each visit.    Past Medical History:  Diagnosis Date  . A-fib ([place]) 01/10/2019   pt st this was dx by Dr. Ubaldo Glassing  . Cancer ([place]) 2021   LUNG  . Complication of anesthesia    DIFFICULTY WAKING UP AFTER SURGERY- 20 YRS AGO  . Hypertension   . Substance abuse [facility])     Past Surgical History:  Procedure Laterality Date  . ARM DEBRIDEMENT Left    INCISION AND DEBRIDEMENT LOWER ARM -20 YRS AGO  . BACK SURGERY    . PORTACATH PLACEMENT Left 08/29/2019   Procedure: INSERTION PORT-A-CATH;  Surgeon: Nestor Lewandowsky, MD;  Location: [facility] ORS;  Service: General;  Laterality: Left;    No family history on file.  Social History:  reports that he has been smoking cigarettes. He has been smoking about 0.50 packs per day. He has never used smokeless tobacco. He reports current alcohol use. He reports current drug use. Drug: Heroin. The patient denies any exposure to radiation or toxins.  The patient lives in [place]. He has family friends staying with him to help care for his needs. His niece , [name] [phone]), is his medical power of attorney. The patient is accompanied by Aniceto Boss via [place] today.  Allergies: No Known Allergies  Current Medications: Current Outpatient Medications  Medication Sig Dispense  Refill  . diltiazem (CARDIZEM CD) 240 MG 24 hr capsule Take 1 capsule (240 mg total) by mouth daily. 30 capsule 2  . ELIQUIS 5 MG TABS tablet Take 5 mg by mouth 2 (two) times daily.    Marland Kitchen escitalopram (LEXAPRO) 10 MG tablet Take 1 tablet (10 mg total) by mouth daily. 90 tablet 0  . feeding supplement, ENSURE ENLIVE, (ENSURE ENLIVE) LIQD Take 237 mLs  by mouth 3 (three) times daily between meals. 237 mL 12  . lidocaine-prilocaine (EMLA) cream Apply to affected area once 30 g 3  . losartan (COZAAR) 25 MG tablet Take 1 tablet (25 mg total) by mouth daily. 30 tablet 2  . ondansetron (ZOFRAN) 8 MG tablet Take 1 tablet (8 mg total) by mouth every 8 (eight) hours as needed for refractory nausea / vomiting. Start on day 3 after carboplatin chemo. 30 tablet 1   No current facility-administered medications for this visit.    Review of Systems  Constitutional: Negative for chills, diaphoresis, fever, malaise/fatigue and weight loss (up 5 lbs).       Doing "alright".  HENT: Positive for sore throat (thrush). Negative for congestion, ear discharge, ear pain, hearing loss, nosebleeds, sinus pain and tinnitus.   Eyes: Negative.  Negative for blurred vision, double vision and photophobia.  Respiratory: Positive for cough, sputum production (thick white phlegm) and shortness of breath (chronic). Negative for hemoptysis.   Cardiovascular: Positive for leg swelling (left ankle). Negative for chest pain and palpitations.  Gastrointestinal: Negative for abdominal pain, blood in stool, constipation, diarrhea, heartburn, melena, nausea (a few days ago) and vomiting.       Drinking 2 ensure per day.  Genitourinary: Negative for dysuria, flank pain, frequency, hematuria and urgency.  Musculoskeletal: Positive for myalgias (left ankle). Negative for back pain, joint pain and neck pain.  Skin: Negative for itching and rash.  Neurological: Negative for dizziness, tingling, sensory change, weakness and headaches.  Endo/Heme/Allergies: Does not bruise/bleed easily.  Psychiatric/Behavioral: Negative for depression and memory loss. The patient is not nervous/anxious and does not have insomnia.   All other systems reviewed and are negative.  Performance status (ECOG):  2  Vitals Blood pressure 138/90, pulse 100, temperature (!) 97.1 F (36.2 C), temperature source  Tympanic, resp. rate 16, weight 147 lb 2.5 oz (66.7 kg), SpO2 94 %.   Physical Exam  Constitutional: He is oriented to person, place, and time. He appears well-developed and well-nourished.  Thin, chronically fatigued appearing gentleman sitting comfortably in wheelchair in no acute distress.  HENT:  Head: Normocephalic and atraumatic.  Mouth/Throat: Oropharynx is clear and moist. No oropharyngeal exudate.  Cap.  Brown hair.  Graying goatee.  Mask. Trush, thick.  Eyes: Pupils are equal, round, and reactive to light. Conjunctivae and EOM are normal. No scleral icterus.  Blue eyes.  Neck: No JVD present.  Cardiovascular: Normal rate, regular rhythm and normal heart sounds.  No murmur heard. Pulmonary/Chest: Effort normal and breath sounds normal. No respiratory distress. He has no wheezes. He has no rales. He exhibits no tenderness.  Fluid in lungs.  Abdominal: Soft. Bowel sounds are normal. He exhibits no distension and no mass. There is no abdominal tenderness. There is no rebound and no guarding.  Musculoskeletal:        General: Edema (1-2+, left ankle) present. No tenderness. Normal range of motion.     Cervical back: Normal range of motion and neck supple.  Lymphadenopathy:       Head (right side):  No preauricular, no posterior auricular and no occipital adenopathy present.       Head (left side): No preauricular, no posterior auricular and no occipital adenopathy present.    He has no cervical adenopathy.    He has no axillary adenopathy.       Right: No supraclavicular adenopathy present.       Left: No supraclavicular adenopathy present.  Neurological: He is alert and oriented to person, place, and time.  Skin: Skin is warm and dry. No rash noted. He is not diaphoretic. No erythema. No pallor.  Psychiatric: He has a normal mood and affect. His behavior is normal. Judgment and thought content normal.  Nursing note and vitals reviewed.   Infusion on 09/02/2019  Component Date  Value Ref Range Status  . WBC 09/02/2019 8.5  4.0 - 10.5 K/uL Final  . RBC 09/02/2019 3.49* 4.22 - 5.81 MIL/uL Final  . Hemoglobin 09/02/2019 11.7* 13.0 - 17.0 g/dL Final  . HCT 09/02/2019 34.1* 39.0 - 52.0 % Final  . MCV 09/02/2019 97.7  80.0 - 100.0 fL Final  . MCH 09/02/2019 33.5  26.0 - 34.0 pg Final  . MCHC 09/02/2019 34.3  30.0 - 36.0 g/dL Final  . RDW 09/02/2019 12.3  11.5 - 15.5 % Final  . Platelets 09/02/2019 310  150 - 400 K/uL Final  . nRBC 09/02/2019 0.0  0.0 - 0.2 % Final  . Neutrophils Relative % 09/02/2019 70  % Final  . Neutro Abs 09/02/2019 5.9  1.7 - 7.7 K/uL Final  . Lymphocytes Relative 09/02/2019 16  % Final  . Lymphs Abs 09/02/2019 1.3  0.7 - 4.0 K/uL Final  . Monocytes Relative 09/02/2019 11  % Final  . Monocytes Absolute 09/02/2019 0.9  0.1 - 1.0 K/uL Final  . Eosinophils Relative 09/02/2019 1  % Final  . Eosinophils Absolute 09/02/2019 0.1  0.0 - 0.5 K/uL Final  . Basophils Relative 09/02/2019 0  % Final  . Basophils Absolute 09/02/2019 0.0  0.0 - 0.1 K/uL Final  . Immature Granulocytes 09/02/2019 2  % Final  . Abs Immature Granulocytes 09/02/2019 0.19* 0.00 - 0.07 K/uL Final   Performed at [facility], [address], [place], [place] [address]    Assessment:  Scott Jordan is a 69 y.o. male with extensive stage small cell lung cancer.  He has a >40 pack year smoking.  He presented with a recurrent left sided pleural effusion. He has undergone thoracentesis x 2 in the past month. He is s/p pigtail drain. Initial cytology x 2 was negative (lymphocytic exudative effusion). Pleural fluid cytologyon 08/18/2019 confirmed small cell lung cancer.   Chest CT without contraston 08/19/2019 revealed the left pleural pigtail drain. There was a small residual left hydropneumothorax. There was a 6.7 x 4 cm medial basilar left lower lobe lung masssuspicious for primary bronchogenic carcinoma. There was a lingular 1.0 cm solid pulmonary nodulesuspicious  for ipsilateral metastasis. There was intralobular septal thickening and patchy groundglass opacity throughout the lingula and the left lower lobe with moderate residual atelectasis of the left lung base. A component of lymphangitic carcinomatosis was suspected. There was ipsilateral infra hilar, subcarinal, AP window, prevascular and upper retroperitoneal adenopathyc/wmetastatic disease.  Head MRI on 08/21/2019 revealed no evidence of metastatic disease.  PET scan on 08/29/2019 revealed markedly hypermetabolic left lower lobe pulmonary lesion (SUV 7.9) c/w the patient's known malignancy. There was associated hypermetabolic nodal metastases in the mediastinum and left hilum with metastatic involvement of left  pleural space. Additional sites included upper abdominal hypermetabolic lymphadenopathy and scattered hypermetabolic bone metastases (left acetabulum, sacrum, and L5). There was interval decrease in left pleural effusion with areas of loculation.  He has hyponatremia likely secondary to SIADH.  He has a significant family history of malignancy. A family member has had genetic testing (negative for Lynch syndrome).  Symptomatically, he feels alright.  He has thrush and ankle edema.  Plan: 1.   Labs today: CBC with diff, CMP, Mg, CEA.  2.   Metastatic small cell lung cancer             Clinical stage T3N2M1 (stage IV). Pleural fluid cytology confirmed small cell lung cancer. Head MRI was negative for CNS metastasis. PET scan on 09/04/2019 was personally reviewed.  Agree with radiology interpretation.    He has a left lower lobe primary with nodal disease in the mediastinum as well as scattered bones  Review treatment plan:                         Carboplatin and etoposide + PDL-1 antibody (atezolizumab) x 4 cycles followed by [employer].   Treatment is palliative.     Patient consents to treatment.  Labs reviewed.  Day 1 of cycle #1  carboplatin, etoposide and Tecentriq.  Discuss symptom management.  He has antiemetics and pain medications at home to use on a prn bases.  Interventions are adequate.    3.Post-obstructive pneumonia Patient treated during his hospitalization with ceftriaxone and azithromycin.             Continue to monitor. 4.Left pleural effusion  Patient is s/p thoracentesis x 2 followed by pig tail drainage in hospital.  Patient to contact clinic if pleural fluid reaccumulate's and thoracentesis needed 5.   Bone metastasis   Multiple bones involved on PET scan.  Plain films of the left hip to ensure bone integrity as it is a weightbearing bone.  Samara Deist.  Information on Xgeva provided. 6.   Hyponatremia Sodium 129. Etiology secondary to SIADH.  Continue fluids with electrolytes.  Limit free water. 7. Cancer-related pain  Left lower chest wall pain is well controlled.  Pain controlled. 8.   History of atrial fibrillation  He is on Eliquis per Dr. Ubaldo Glassing.  Watch counts during nadir.  Maintain platelet count > 50,000 9. Thrush  Rx:  Nystatin suspension. 10.   RTC on Tuesday and Wednesday this week for etoposide. 11.   Labs on 09/04/2019- BMP. 12.   RTC on Thursday this week for brief MD assessment and Ud[place]a. 13.   RTC on 09/11/2019 for MD assessment, labs (CBC with diff, BMP), and +/- Xgeva. 14.   Ensure patient has Lucianne Lei transport for each visit.  I discussed the assessment and treatment plan with the patient.  The patient was provided an opportunity to ask questions and all were answered.  The patient agreed with the plan and demonstrated an understanding of the instructions.  The patient was advised to call back if the symptoms worsen or if the condition fails to improve as anticipated.    Carri Spillers C. Mike Gip, MD, PhD    09/02/2019, 9:08 AM  I, Selena Batten, am acting as scribe for [place]. Mike Gip, MD, PhD.  I, Glinda Natzke C. Mike Gip,  MD, have reviewed the above documentation for accuracy and completeness, and I agree with the above.

## 2019-08-29 NOTE — Patient Instructions (Signed)
Atezolizumab injection What is this medicine? ATEZOLIZUMAB (a te zoe LIZ ue mab) is a monoclonal antibody. It is used to treat bladder cancer (urothelial cancer), liver cancer, lung cancer, breast cancer, and melanoma. This medicine may be used for other purposes; ask your health care provider or pharmacist if you have questions. COMMON BRAND NAME(S): Tecentriq What should I tell my health care provider before I take this medicine? They need to know if you have any of these conditions:  diabetes  immune system problems  infection  inflammatory bowel disease  liver disease  lung or breathing disease  lupus  nervous system problems like myasthenia gravis or Guillain-Barre syndrome  organ transplant  an unusual or allergic reaction to atezolizumab, other medicines, foods, dyes, or preservatives  pregnant or trying to get pregnant  breast-feeding How should I use this medicine? This medicine is for infusion into a vein. It is given by a health care professional in a hospital or clinic setting. A special MedGuide will be given to you before each treatment. Be sure to read this information carefully each time. Talk to your pediatrician regarding the use of this medicine in children. Special care may be needed. Overdosage: If you think you have taken too much of this medicine contact a poison control center or emergency room at once. NOTE: This medicine is only for you. Do not share this medicine with others. What if I miss a dose? It is important not to miss your dose. Call your doctor or health care professional if you are unable to keep an appointment. What may interact with this medicine? Interactions have not been studied. This list may not describe all possible interactions. Give your health care provider a list of all the medicines, herbs, non-prescription drugs, or dietary supplements you use. Also tell them if you smoke, drink alcohol, or use illegal drugs. Some items may  interact with your medicine. What should I watch for while using this medicine? Your condition will be monitored carefully while you are receiving this medicine. You may need blood work done while you are taking this medicine. Do not become pregnant while taking this medicine or for at least 5 months after stopping it. Women should inform their doctor if they wish to become pregnant or think they might be pregnant. There is a potential for serious side effects to an unborn child. Talk to your health care professional or pharmacist for more information. Do not breast-feed an infant while taking this medicine or for at least 5 months after the last dose. What side effects may I notice from receiving this medicine? Side effects that you should report to your doctor or health care professional as soon as possible:  allergic reactions like skin rash, itching or hives, swelling of the face, lips, or tongue  black, tarry stools  bloody or watery diarrhea  breathing problems  changes in vision  chest pain or chest tightness  chills  facial flushing  fever  headache  signs and symptoms of high blood sugar such as dizziness; dry mouth; dry skin; fruity breath; nausea; stomach pain; increased hunger or thirst; increased urination  signs and symptoms of liver injury like dark yellow or brown urine; general ill feeling or flu-like symptoms; light-colored stools; loss of appetite; nausea; right upper belly pain; unusually weak or tired; yellowing of the eyes or skin  stomach pain  trouble passing urine or change in the amount of urine Side effects that usually do not require medical attention (report to  your doctor or health care professional if they continue or are bothersome):  bone pain  cough  diarrhea  joint pain  muscle pain  muscle weakness  swelling of arms or legs  tiredness  weight loss This list may not describe all possible side effects. Call your doctor for  medical advice about side effects. You may report side effects to FDA at 1-800-FDA-1088. Where should I keep my medicine? This drug is given in a hospital or clinic and will not be stored at home. NOTE: This sheet is a summary. It may not cover all possible information. If you have questions about this medicine, talk to your doctor, pharmacist, or health care provider.  2020 Elsevier/Gold Standard (2018-11-16 13:11:14) Etoposide, VP-16 injection What is this medicine? ETOPOSIDE, VP-16 (e toe POE side) is a chemotherapy drug. It is used to treat testicular cancer, lung cancer, and other cancers. This medicine may be used for other purposes; ask your health care provider or pharmacist if you have questions. COMMON BRAND NAME(S): Etopophos, Toposar, VePesid What should I tell my health care provider before I take this medicine? They need to know if you have any of these conditions:  infection  kidney disease  liver disease  low blood counts, like low white cell, platelet, or red cell counts  an unusual or allergic reaction to etoposide, other medicines, foods, dyes, or preservatives  pregnant or trying to get pregnant  breast-feeding How should I use this medicine? This medicine is for infusion into a vein. It is administered in a hospital or clinic by a specially trained health care professional. Talk to your pediatrician regarding the use of this medicine in children. Special care may be needed. Overdosage: If you think you have taken too much of this medicine contact a poison control center or emergency room at once. NOTE: This medicine is only for you. Do not share this medicine with others. What if I miss a dose? It is important not to miss your dose. Call your doctor or health care professional if you are unable to keep an appointment. What may interact with this medicine? This medicine may interact with the following medications:  warfarin This list may not describe all  possible interactions. Give your health care provider a list of all the medicines, herbs, non-prescription drugs, or dietary supplements you use. Also tell them if you smoke, drink alcohol, or use illegal drugs. Some items may interact with your medicine. What should I watch for while using this medicine? Visit your doctor for checks on your progress. This drug may make you feel generally unwell. This is not uncommon, as chemotherapy can affect healthy cells as well as cancer cells. Report any side effects. Continue your course of treatment even though you feel ill unless your doctor tells you to stop. In some cases, you may be given additional medicines to help with side effects. Follow all directions for their use. Call your doctor or health care professional for advice if you get a fever, chills or sore throat, or other symptoms of a cold or flu. Do not treat yourself. This drug decreases your body's ability to fight infections. Try to avoid being around people who are sick. This medicine may increase your risk to bruise or bleed. Call your doctor or health care professional if you notice any unusual bleeding. Talk to your doctor about your risk of cancer. You may be more at risk for certain types of cancers if you take this medicine. Do not become  pregnant while taking this medicine or for at least 6 months after stopping it. Women should inform their doctor if they wish to become pregnant or think they might be pregnant. Women of child-bearing potential will need to have a negative pregnancy test before starting this medicine. There is a potential for serious side effects to an unborn child. Talk to your health care professional or pharmacist for more information. Do not breast-feed an infant while taking this medicine. Men must use a latex condom during sexual contact with a woman while taking this medicine and for at least 4 months after stopping it. A latex condom is needed even if you have had a  vasectomy. Contact your doctor right away if your partner becomes pregnant. Do not donate sperm while taking this medicine and for at least 4 months after you stop taking this medicine. Men should inform their doctors if they wish to father a child. This medicine may lower sperm counts. What side effects may I notice from receiving this medicine? Side effects that you should report to your doctor or health care professional as soon as possible:  allergic reactions like skin rash, itching or hives, swelling of the face, lips, or tongue  low blood counts - this medicine may decrease the number of white blood cells, red blood cells, and platelets. You may be at increased risk for infections and bleeding  nausea, vomiting  redness, blistering, peeling or loosening of the skin, including inside the mouth  signs and symptoms of infection like fever; chills; cough; sore throat; pain or trouble passing urine  signs and symptoms of low red blood cells or anemia such as unusually weak or tired; feeling faint or lightheaded; falls; breathing problems  unusual bruising or bleeding Side effects that usually do not require medical attention (report to your doctor or health care professional if they continue or are bothersome):  changes in taste  diarrhea  hair loss  loss of appetite  mouth sores This list may not describe all possible side effects. Call your doctor for medical advice about side effects. You may report side effects to FDA at 1-800-FDA-1088. Where should I keep my medicine? This drug is given in a hospital or clinic and will not be stored at home. NOTE: This sheet is a summary. It may not cover all possible information. If you have questions about this medicine, talk to your doctor, pharmacist, or health care provider.  2020 Elsevier/Gold Standard (2018-05-23 16:57:15) Carboplatin injection What is this medicine? CARBOPLATIN (KAR boe pla tin) is a chemotherapy drug. It targets  fast dividing cells, like cancer cells, and causes these cells to die. This medicine is used to treat ovarian cancer and many other cancers. This medicine may be used for other purposes; ask your health care provider or pharmacist if you have questions. COMMON BRAND NAME(S): Paraplatin What should I tell my health care provider before I take this medicine? They need to know if you have any of these conditions:  blood disorders  hearing problems  kidney disease  recent or ongoing radiation therapy  an unusual or allergic reaction to carboplatin, cisplatin, other chemotherapy, other medicines, foods, dyes, or preservatives  pregnant or trying to get pregnant  breast-feeding How should I use this medicine? This drug is usually given as an infusion into a vein. It is administered in a hospital or clinic by a specially trained health care professional. Talk to your pediatrician regarding the use of this medicine in children. Special care may  be needed. Overdosage: If you think you have taken too much of this medicine contact a poison control center or emergency room at once. NOTE: This medicine is only for you. Do not share this medicine with others. What if I miss a dose? It is important not to miss a dose. Call your doctor or health care professional if you are unable to keep an appointment. What may interact with this medicine?  medicines for seizures  medicines to increase blood counts like filgrastim, pegfilgrastim, sargramostim  some antibiotics like amikacin, gentamicin, neomycin, streptomycin, tobramycin  vaccines Talk to your doctor or health care professional before taking any of these medicines:  acetaminophen  aspirin  ibuprofen  ketoprofen  naproxen This list may not describe all possible interactions. Give your health care provider a list of all the medicines, herbs, non-prescription drugs, or dietary supplements you use. Also tell them if you smoke, drink  alcohol, or use illegal drugs. Some items may interact with your medicine. What should I watch for while using this medicine? Your condition will be monitored carefully while you are receiving this medicine. You will need important blood work done while you are taking this medicine. This drug may make you feel generally unwell. This is not uncommon, as chemotherapy can affect healthy cells as well as cancer cells. Report any side effects. Continue your course of treatment even though you feel ill unless your doctor tells you to stop. In some cases, you may be given additional medicines to help with side effects. Follow all directions for their use. Call your doctor or health care professional for advice if you get a fever, chills or sore throat, or other symptoms of a cold or flu. Do not treat yourself. This drug decreases your body's ability to fight infections. Try to avoid being around people who are sick. This medicine may increase your risk to bruise or bleed. Call your doctor or health care professional if you notice any unusual bleeding. Be careful brushing and flossing your teeth or using a toothpick because you may get an infection or bleed more easily. If you have any dental work done, tell your dentist you are receiving this medicine. Avoid taking products that contain aspirin, acetaminophen, ibuprofen, naproxen, or ketoprofen unless instructed by your doctor. These medicines may hide a fever. Do not become pregnant while taking this medicine. Women should inform their doctor if they wish to become pregnant or think they might be pregnant. There is a potential for serious side effects to an unborn child. Talk to your health care professional or pharmacist for more information. Do not breast-feed an infant while taking this medicine. What side effects may I notice from receiving this medicine? Side effects that you should report to your doctor or health care professional as soon as  possible:  allergic reactions like skin rash, itching or hives, swelling of the face, lips, or tongue  signs of infection - fever or chills, cough, sore throat, pain or difficulty passing urine  signs of decreased platelets or bleeding - bruising, pinpoint red spots on the skin, black, tarry stools, nosebleeds  signs of decreased red blood cells - unusually weak or tired, fainting spells, lightheadedness  breathing problems  changes in hearing  changes in vision  chest pain  high blood pressure  low blood counts - This drug may decrease the number of white blood cells, red blood cells and platelets. You may be at increased risk for infections and bleeding.  nausea and vomiting  pain, swelling, redness or irritation at the injection site  pain, tingling, numbness in the hands or feet  problems with balance, talking, walking  trouble passing urine or change in the amount of urine Side effects that usually do not require medical attention (report to your doctor or health care professional if they continue or are bothersome):  hair loss  loss of appetite  metallic taste in the mouth or changes in taste This list may not describe all possible side effects. Call your doctor for medical advice about side effects. You may report side effects to FDA at 1-800-FDA-1088. Where should I keep my medicine? This drug is given in a hospital or clinic and will not be stored at home. NOTE: This sheet is a summary. It may not cover all possible information. If you have questions about this medicine, talk to your doctor, pharmacist, or health care provider.  2020 Elsevier/Gold Standard (2007-07-03 14:38:05)

## 2019-08-29 NOTE — Progress Notes (Signed)
Tumor Board Documentation  Scott Jordan was presented by Dr Mike Gip at our Tumor Board on 08/29/2019, which included representatives from medical oncology, radiation oncology, navigation, pathology, radiology, surgical, internal medicine, pharmacy, palliative care, pulmonology, research.  Scott Jordan currently presents as a new patient, for new positive pathology, for [place] with history of the following treatments: surgical intervention(s), active survellience.  Additionally, we reviewed previous medical and familial history, history of present illness, and recent lab results along with all available histopathologic and imaging studies. The tumor board considered available treatment options and made the following recommendations: Chemotherapy, Immunotherapy    The following procedures/referrals were also placed: No orders of the defined types were placed in this encounter.   Clinical Trial Status: not discussed   Staging used: Clinical Stage  AJCC Staging: T: 3 N: 2 M: 1 Group: Clinical Stage 4 Small Cell Lung Cancer   National site-specific guidelines   were discussed with respect to the case.  Tumor board is a meeting of clinicians from various specialty areas who evaluate and discuss patients for whom a multidisciplinary approach is being considered. Final determinations in the plan of care are those of the provider(s). The responsibility for follow up of recommendations given during tumor board is that of the provider.   Today's extended care, comprehensive team conference, Scott Jordan was not present for the discussion and was not examined.   Multidisciplinary Tumor Board is a multidisciplinary case peer review process.  Decisions discussed in the Multidisciplinary Tumor Board reflect the opinions of the specialists present at the conference without having examined the patient.  Ultimately, treatment and diagnostic decisions rest with the primary provider(s) and the patient.

## 2019-08-29 NOTE — Discharge Instructions (Signed)
Implanted [facility] Guide An implanted port is a device that is placed under the skin. It is usually placed in the chest. The device can be used to give IV medicine, to take blood, or for dialysis. You may have an implanted port if:  You need IV medicine that would be irritating to the small veins in your hands or arms.  You need IV medicines, such as antibiotics, for a long period of time.  You need IV nutrition for a long period of time.  You need dialysis. Having a port means that your health care provider will not need to use the veins in your arms for these procedures. You may have fewer limitations when using a port than you would if you used other types of long-term IVs, and you will likely be able to return to normal activities after your incision heals. An implanted port has two main parts:  Reservoir. The reservoir is the part where a needle is inserted to give medicines or draw blood. The reservoir is round. After it is placed, it appears as a small, raised area under your skin.  Catheter. The catheter is a thin, flexible tube that connects the reservoir to a vein. Medicine that is inserted into the reservoir goes into the catheter and then into the vein. How is my port accessed? To access your port:  A numbing cream may be placed on the skin over the port site.  Your health care provider will put on a mask and sterile gloves.  The skin over your port will be cleaned carefully with a germ-killing soap and allowed to dry.  Your health care provider will gently pinch the port and insert a needle into it.  Your health care provider will check for a blood return to make sure the port is in the vein and is not clogged.  If your port needs to remain accessed to get medicine continuously (constant infusion), your health care provider will place a clear bandage (dressing) over the needle site. The dressing and needle will need to be changed every week, or as told by your health care  provider. What is flushing? Flushing helps keep the port from getting clogged. Follow instructions from your health care provider about how and when to flush the port. Ports are usually flushed with saline solution or a medicine called heparin. The need for flushing will depend on how the port is used:  If the port is only used from time to time to give medicines or draw blood, the port may need to be flushed: ? Before and after medicines have been given. ? Before and after blood has been drawn. ? As part of routine maintenance. Flushing may be recommended every 4-6 weeks.  If a constant infusion is running, the port may not need to be flushed.  Throw away any syringes in a disposal container that is meant for sharp items (sharps container). You can buy a sharps container from a pharmacy, or you can make one by using an empty hard plastic bottle with a cover. How long will my port stay implanted? The port can stay in for as long as your health care provider thinks it is needed. When it is time for the port to come out, a surgery will be done to remove it. The surgery will be similar to the procedure that was done to put the port in. Follow these instructions at home:   Flush your port as told by your health care provider.  If you need an infusion over several days, follow instructions from your health care provider about how to take care of your port site. Make sure you: ? Wash your hands with soap and water before you change your dressing. If soap and water are not available, use alcohol-based hand sanitizer. ? Change your dressing as told by your health care provider. ? Place any used dressings or infusion bags into a plastic bag. Throw that bag in the trash. ? Keep the dressing that covers the needle clean and dry. Do not get it wet. ? Do not use scissors or sharp objects near the tube. ? Keep the tube clamped, unless it is being used.  Check your port site every day for signs of  infection. Check for: ? Redness, swelling, or pain. ? Fluid or blood. ? Pus or a bad smell.  Protect the skin around the port site. ? Avoid wearing bra straps that rub or irritate the site. ? Protect the skin around your port from seat belts. Place a soft pad over your chest if needed.  Bathe or shower as told by your health care provider. The site may get wet as long as you are not actively receiving an infusion.  Return to your normal activities as told by your health care provider. Ask your health care provider what activities are safe for you.  Carry a medical alert card or wear a medical alert bracelet at all times. This will let health care providers know that you have an implanted port in case of an emergency. Get help right away if:  You have redness, swelling, or pain at the port site.  You have fluid or blood coming from your port site.  You have pus or a bad smell coming from the port site.  You have a fever. Summary  Implanted ports are usually placed in the chest for long-term IV access.  Follow instructions from your health care provider about flushing the port and changing bandages (dressings).  Take care of the area around your port by avoiding clothing that puts pressure on the area, and by watching for signs of infection.  Protect the skin around your port from seat belts. Place a soft pad over your chest if needed.  Get help right away if you have a fever or you have redness, swelling, pain, drainage, or a bad smell at the port site. This information is not intended to replace advice given to you by your health care provider. Make sure you discuss any questions you have with your health care provider. Document Revised: 07/20/2018 Document Reviewed: 04/30/2016 Elsevier Patient Education  2020 [place]   1) The drugs that you were given will stay in your system until tomorrow so for the next 24 hours you should  not:  A) Drive an automobile B) Make any legal decisions C) Drink any alcoholic beverage   2) You may resume regular meals tomorrow.  Today it is better to start with liquids and gradually work up to solid foods.  You may eat anything you prefer, but it is better to start with liquids, then soup and crackers, and gradually work up to solid foods.   3) Please notify your doctor immediately if you have any unusual bleeding, trouble breathing, redness and pain at the surgery site, drainage, fever, or pain not relieved by medication.    4) Additional Instructions:        Please contact your physician with any  problems or Same Day Surgery at [phone], Monday through Friday 6 am to 4 pm, or [place] at [facility] number at [phone].

## 2019-08-29 NOTE — Progress Notes (Signed)
PT verbalizes d/c understanding, pt ambulatory, VS. Pt instructed to take off dressing after 48 hrs per Dr. Genevive Bi

## 2019-08-29 NOTE — Interval H&P Note (Signed)
History and Physical Interval Note:  08/29/2019 12:29 PM  Scott Jordan  has presented today for surgery, with the diagnosis of Lung cancer.  The various methods of treatment have been discussed with the patient and family. After consideration of risks, benefits and other options for treatment, the patient has consented to  Procedure(s): INSERTION PORT-A-CATH (N/A) as a surgical intervention.  The patient's history has been reviewed, patient examined, no change in status, stable for surgery.  I have reviewed the patient's chart and labs.  Questions were answered to the patient's satisfaction.     Nestor Lewandowsky

## 2019-08-29 NOTE — Anesthesia Procedure Notes (Signed)
Procedure Name: LMA Insertion Date/Time: 08/29/2019 12:46 PM Performed by: Hedda Slade, CRNA Pre-anesthesia Checklist: Patient identified, Patient being monitored, Timeout performed, Emergency Drugs available and Suction available Patient Re-evaluated:Patient Re-evaluated prior to induction Oxygen Delivery Method: Circle system utilized Preoxygenation: Pre-oxygenation with 100% oxygen Induction Type: IV induction Ventilation: Mask ventilation without difficulty LMA: LMA inserted LMA Size: 4.0 Tube type: Oral Number of attempts: 1 Placement Confirmation: positive ETCO2 and breath sounds checked- equal and bilateral Tube secured with: Tape Dental Injury: Teeth and Oropharynx as per pre-operative assessment  Comments: #4 Ambu Aurostraight LMA inserted by Reece Agar, SRNA under supervision of CRNA and MDA

## 2019-08-29 NOTE — Op Note (Signed)
08/29/2019  2:08 PM  PATIENT:  Scott Jordan  69 y.o. male  PRE-OPERATIVE DIAGNOSIS:  Lung cancer  POST-OPERATIVE DIAGNOSIS:  Lung cancer  PROCEDURE:  Procedure(s): INSERTION PORT-A-CATH (Left)  SURGEON:  Surgeon(s) and Role:    * Nestor Lewandowsky, MD - Primary  ASSISTANTS:    ANESTHESIA:   LMA  DICTATION:   The patient was brought to the operating suite and placed in the supine position. LMA was established.. The patient was then prepped and draped in usual sterile fashion. The left subclavian vein was percutaneously catheterized. A wire was placed into the venous system under fluoroscopic guidance. An appropriate site was selected on the chest wall and a Port-A-Cath pocket was created. The catheter was tunneled from the port site up to the insertion site. The catheter was then inserted through a peel-away sheath and positioned at the appropriate level in the superior vena cava. The catheter was then assembled and aspirated and flushed easily. It was then secured to the anterior chest wall with interrupted Prolene sutures. The catheter was flushed one last time and the wounds were then closed. The subcutaneous tissues were closed with running absorbable sutures and the skin with nylon. Sterile dressings were applied. Patient was then transported to the recovery room in stable condition.   Nestor Lewandowsky, MD

## 2019-08-29 NOTE — Anesthesia Preprocedure Evaluation (Signed)
Anesthesia Evaluation  Patient identified by MRN, date of birth, ID band Patient awake    Reviewed: Allergy & Precautions, H&P , NPO status , Patient's Chart, lab work & pertinent test results  History of Anesthesia Complications Negative for: history of anesthetic complications  Airway Mallampati: III  TM Distance: >3 FB Neck ROM: full    Dental  (+) Chipped, Poor Dentition, Missing, Edentulous Upper   Pulmonary shortness of breath, COPD, Current Smoker,    Pulmonary exam normal        Cardiovascular Exercise Tolerance: Good hypertension, (-) angina(-) DOE + dysrhythmias Atrial Fibrillation      Neuro/Psych negative neurological ROS  negative psych ROS   GI/Hepatic negative GI ROS, Neg liver ROS, neg GERD  ,  Endo/Other  negative endocrine ROS  Renal/GU      Musculoskeletal   Abdominal   Peds  Hematology negative hematology ROS (+)   Anesthesia Other Findings Past Medical History: 01/10/2019: A-fib [facility])     Comment:  pt st this was dx by Dr. Ubaldo Glassing 2021: Cancer [facility])     Comment:  LUNG No date: Complication of anesthesia     Comment:  DIFFICULTY WAKING UP AFTER SURGERY- 26 YRS AGO No date: Hypertension No date: Substance abuse ([place])  Past Surgical History: No date: ARM DEBRIDEMENT; Left     Comment:  INCISION AND DEBRIDEMENT LOWER ARM -20 YRS AGO No date: BACK SURGERY  BMI    Body Mass Index: 18.23 kg/m      Reproductive/Obstetrics negative OB ROS                             Anesthesia Physical Anesthesia Plan  ASA: III  Anesthesia Plan: General LMA   Post-op Pain Management:    Induction: Intravenous  PONV Risk Score and Plan: Dexamethasone, Ondansetron, Midazolam and Treatment may vary due to age or medical condition  Airway Management Planned: LMA  Additional Equipment:   Intra-op Plan:   Post-operative Plan: Extubation in OR  Informed Consent: I have  reviewed the patients History and Physical, chart, labs and discussed the procedure including the risks, benefits and alternatives for the proposed anesthesia with the patient or authorized representative who has indicated his/her understanding and acceptance.     Dental Advisory Given  Plan Discussed with: Anesthesiologist, CRNA and Surgeon  Anesthesia Plan Comments: (Patient consented for risks of anesthesia including but not limited to:  - adverse reactions to medications - damage to eyes, teeth, lips or other oral mucosa - nerve damage due to positioning  - sore throat or hoarseness - Damage to heart, brain, nerves, lungs or loss of life  Patient voiced understanding.)        Anesthesia Quick Evaluation

## 2019-08-30 ENCOUNTER — Inpatient Hospital Stay (HOSPITAL_BASED_OUTPATIENT_CLINIC_OR_DEPARTMENT_OTHER): Payer: Medicare Other | Admitting: Oncology

## 2019-08-30 ENCOUNTER — Encounter: Payer: Self-pay | Admitting: *Deleted

## 2019-08-30 ENCOUNTER — Inpatient Hospital Stay: Payer: Medicare Other

## 2019-08-30 DIAGNOSIS — Z7901 Long term (current) use of anticoagulants: Secondary | ICD-10-CM | POA: Diagnosis not present

## 2019-08-30 DIAGNOSIS — G893 Neoplasm related pain (acute) (chronic): Secondary | ICD-10-CM | POA: Diagnosis not present

## 2019-08-30 DIAGNOSIS — I509 Heart failure, unspecified: Secondary | ICD-10-CM | POA: Diagnosis not present

## 2019-08-30 DIAGNOSIS — F17210 Nicotine dependence, cigarettes, uncomplicated: Secondary | ICD-10-CM | POA: Diagnosis not present

## 2019-08-30 DIAGNOSIS — Z5189 Encounter for other specified aftercare: Secondary | ICD-10-CM | POA: Diagnosis not present

## 2019-08-30 DIAGNOSIS — J90 Pleural effusion, not elsewhere classified: Secondary | ICD-10-CM | POA: Diagnosis not present

## 2019-08-30 DIAGNOSIS — I4891 Unspecified atrial fibrillation: Secondary | ICD-10-CM | POA: Diagnosis not present

## 2019-08-30 DIAGNOSIS — J189 Pneumonia, unspecified organism: Secondary | ICD-10-CM | POA: Diagnosis not present

## 2019-08-30 DIAGNOSIS — C3432 Malignant neoplasm of lower lobe, left bronchus or lung: Secondary | ICD-10-CM | POA: Diagnosis not present

## 2019-08-30 DIAGNOSIS — I110 Hypertensive heart disease with heart failure: Secondary | ICD-10-CM | POA: Diagnosis not present

## 2019-08-30 DIAGNOSIS — E871 Hypo-osmolality and hyponatremia: Secondary | ICD-10-CM | POA: Diagnosis not present

## 2019-08-30 DIAGNOSIS — Z79899 Other long term (current) drug therapy: Secondary | ICD-10-CM | POA: Diagnosis not present

## 2019-08-30 DIAGNOSIS — Z5112 Encounter for antineoplastic immunotherapy: Secondary | ICD-10-CM | POA: Diagnosis not present

## 2019-08-30 DIAGNOSIS — Z5111 Encounter for antineoplastic chemotherapy: Secondary | ICD-10-CM | POA: Diagnosis not present

## 2019-08-30 MED ORDER — ESCITALOPRAM OXALATE 10 MG PO TABS: 10.0000 mg | ORAL_TABLET | Freq: Every day | ORAL | 0 refills | Status: DC

## 2019-08-30 NOTE — Progress Notes (Signed)
Scott Jordan  [phone] [phone]  Patient Care Team: Patient, No Pcp Per as PCP - General ([place]) Telford Nab, RN as Registered Nurse Lequita Asal, MD as Referring Physician (Hematology and Oncology) Erby Pian, MD as Referring Physician (Specialist) Teodoro Spray, MD as Consulting Physician (Cardiology)   Name of the patient: Scott Jordan  [id number]  20-Aug-1950   Date of visit: 08/30/19  Diagnosis- Lung cancer   Chief complaint/Reason for visit- Initial Meeting for [facility], preparing for starting chemotherapy  Heme/Onc history:  Oncology History  Small cell lung cancer ([place])  08/23/2019 Initial Diagnosis   Small cell lung cancer ([place])   08/28/2019 -  Chemotherapy   The patient had palonosetron (ALOXI) injection 0.25 mg, 0.25 mg, Intravenous,  Once, 0 of 4 cycles pegfilgrastim-cbqv (UDENYCA) injection 6 mg, 6 mg, Subcutaneous, Once, 0 of 4 cycles CARBOplatin (PARAPLATIN) in sodium chloride 0.9 % 100 mL chemo infusion, , Intravenous,  Once, 0 of 4 cycles etoposide (VEPESID) 180 mg in sodium chloride 0.9 % 500 mL chemo infusion, 100 mg/m2 = 180 mg, Intravenous,  Once, 0 of 4 cycles fosaprepitant (EMEND) 150 mg in sodium chloride 0.9 % 145 mL IVPB, 150 mg, Intravenous,  Once, 0 of 4 cycles atezolizumab (TECENTRIQ) 1,200 mg in sodium chloride 0.9 % 250 mL chemo infusion, 1,200 mg, Intravenous, Once, 0 of 8 cycles  for chemotherapy treatment.      Interval history-Scott Jordan is a 69 year old male who presents to chemo care clinic today for initial meeting in preparation for starting chemotherapy. I introduced the chemo care clinic and we discussed that the role of the clinic is to assist those who are at an increased risk of emergency room visits and/or complications during the course of chemotherapy treatment. We discussed that the increased risk takes into account factors  such as age, performance status, and co-morbidities. We also discussed that for some, this might include barriers to care such as not having a primary care provider, lack of insurance/transportation, or not being able to afford medications. We discussed that the goal of the program is to help prevent unplanned ER visits and help reduce complications during chemotherapy. We do this by discussing specific risk factors to each individual and identifying ways that we can help improve these risk factors and reduce barriers to care.   ECOG FS:1 - Symptomatic but completely ambulatory  Review of systems- Review of Systems  Constitutional: Negative.  Negative for chills, fever, malaise/fatigue and weight loss.  HENT: Negative for congestion, ear pain and tinnitus.   Eyes: Negative.  Negative for blurred vision and double vision.  Respiratory: Positive for sputum production and shortness of breath. Negative for cough.   Cardiovascular: Negative.  Negative for chest pain, palpitations and leg swelling.  Gastrointestinal: Negative.  Negative for abdominal pain, constipation, diarrhea, nausea and vomiting.  Genitourinary: Negative for dysuria, frequency and urgency.  Musculoskeletal: Negative for back pain and falls.  Skin: Negative.  Negative for rash.  Neurological: Negative.  Negative for weakness and headaches.  Endo/Heme/Allergies: Negative.  Does not bruise/bleed easily.  Psychiatric/Behavioral: Negative.  Negative for depression. The patient is not nervous/anxious and does not have insomnia.      Current treatment-carbo etoposide and Tecentriq beginning on 09/02/2019  No Known Allergies  Past Medical History:  Diagnosis Date  . A-fib ([place]) 01/10/2019   pt st this was dx by Dr. Ubaldo Glassing  . Cancer ([place]) 2021  LUNG  . Complication of anesthesia    DIFFICULTY WAKING UP AFTER SURGERY- 20 YRS AGO  . Hypertension   . Substance abuse [facility])     Past Surgical History:  Procedure Laterality Date  .  ARM DEBRIDEMENT Left    INCISION AND DEBRIDEMENT LOWER ARM -20 YRS AGO  . BACK SURGERY    . PORTACATH PLACEMENT Left 08/29/2019   Procedure: INSERTION PORT-A-CATH;  Surgeon: Nestor Lewandowsky, MD;  Location: [facility] ORS;  Service: General;  Laterality: Left;    Social History   Socioeconomic History  . Marital status: Single    Spouse name: Not on file  . Number of children: Not on file  . Years of education: Not on file  . Highest education level: Not on file  Occupational History  . Not on file  Tobacco Use  . Smoking status: Current Every Day Smoker    Packs/day: 0.50    Types: Cigarettes  . Smokeless tobacco: Never Used  . Tobacco comment: STATES HE STOP LAST WEEK  Substance and Sexual Activity  . Alcohol use: Yes    Comment: 84 OZ IN WEEK  . Drug use: Yes    Types: Heroin    Comment: heroin WITHIN PAST YEAR  . Sexual activity: Not on file  Other Topics Concern  . Not on file  Social History Narrative  . Not on file   Social Determinants of Health   Financial Resource Strain:   . Difficulty of Paying Living Expenses:   Food Insecurity:   . Worried About Charity fundraiser in the Last Year:   . Arboriculturist in the Last Year:   Transportation Needs:   . Film/video editor (Medical):   Marland Kitchen Lack of Transportation (Non-Medical):   Physical Activity:   . Days of Exercise per Week:   . Minutes of Exercise per Session:   Stress:   . Feeling of Stress :   Social Connections:   . Frequency of Communication with Friends and Family:   . Frequency of Social Gatherings with Friends and Family:   . Attends Religious Services:   . Active Member of Clubs or Organizations:   . Attends Archivist Meetings:   Marland Kitchen Marital Status:   Intimate Partner Violence:   . Fear of Current or Ex-Partner:   . Emotionally Abused:   Marland Kitchen Physically Abused:   . Sexually Abused:     No family history on file.   Current Outpatient Medications:  .  diltiazem (CARDIZEM CD) 240 MG 24  hr capsule, Take 1 capsule (240 mg total) by mouth daily., Disp: 30 capsule, Rfl: 2 .  escitalopram (LEXAPRO) 10 MG tablet, Take 1 tablet (10 mg total) by mouth daily., Disp: 90 tablet, Rfl: 0 .  feeding supplement, ENSURE ENLIVE, (ENSURE ENLIVE) LIQD, Take 237 mLs by mouth 3 (three) times daily between meals., Disp: 237 mL, Rfl: 12 .  HYDROcodone-acetaminophen (NORCO) 5-325 MG tablet, Take 1 tablet by mouth every 6 (six) hours as needed for moderate pain., Disp: 30 tablet, Rfl: 0 .  lidocaine-prilocaine (EMLA) cream, Apply to affected area once, Disp: 30 g, Rfl: 3 .  losartan (COZAAR) 25 MG tablet, Take 1 tablet (25 mg total) by mouth daily., Disp: 30 tablet, Rfl: 2 .  ondansetron (ZOFRAN) 8 MG tablet, Take 1 tablet (8 mg total) by mouth every 8 (eight) hours as needed for refractory nausea / vomiting. Start on day 3 after carboplatin chemo., Disp: 30 tablet, Rfl: 1  Physical exam: There were no vitals filed for this visit. Physical Exam Constitutional:      Appearance: Normal appearance.  HENT:     Head: Normocephalic and atraumatic.  Eyes:     Pupils: Pupils are equal, round, and reactive to light.  Cardiovascular:     Rate and Rhythm: Normal rate and regular rhythm.     Heart sounds: Normal heart sounds. No murmur.  Pulmonary:     Effort: Pulmonary effort is normal.     Breath sounds: Normal breath sounds. No wheezing.  Abdominal:     General: Bowel sounds are normal. There is no distension.     Palpations: Abdomen is soft.     Tenderness: There is no abdominal tenderness.  Musculoskeletal:        General: Normal range of motion.     Cervical back: Normal range of motion.  Skin:    General: Skin is warm and dry.     Findings: No rash.  Neurological:     Mental Status: He is alert and oriented to person, place, and time.  Psychiatric:        Judgment: Judgment normal.      CMP Latest Ref Rng & Units 08/23/2019  Glucose 70 - 99 mg/dL 115(H)  BUN 8 - 23 mg/dL 7(L)   Creatinine 0.61 - 1.24 mg/dL 0.69  Sodium 135 - 145 mmol/L 131(L)  Potassium 3.5 - 5.1 mmol/L 4.4  Chloride 98 - 111 mmol/L 98  CO2 22 - 32 mmol/L 27  Calcium 8.9 - 10.3 mg/dL 8.5(L)  Total Protein 6.5 - 8.1 g/dL -  Total Bilirubin 0.3 - 1.2 mg/dL -  Alkaline Phos 38 - 126 U/L -  AST 15 - 41 U/L -  ALT 0 - 44 U/L -   CBC Latest Ref Rng & Units 08/23/2019  WBC 4.0 - 10.5 K/uL 6.0  Hemoglobin 13.0 - 17.0 g/dL 12.2(L)  Hematocrit 39.0 - 52.0 % 33.7(L)  Platelets 150 - 400 K/uL 205    No images are attached to the encounter.  DG Chest 1 View  Result Date: 08/30/2019 CLINICAL DATA:  Port-A-Cath insertion. EXAM: CHEST  1 VIEW COMPARISON:  PET-CT 08/29/2019.  Chest x-ray 08/18/2019. FINDINGS: Port-A-Cath noted with tip over cavoatrial junction. Interim removal of left chest tube. Stable cardiomegaly. Persistent left base atelectasis/infiltrate/mass. Reference made to prior PET-CT of 08/29/2019 for further discussion. Interim improvement of left pleural effusion. No pneumothorax. IMPRESSION: 1. Interim placement of Port-A-Cath, its tip is over the cavoatrial junction. Interim removal of the left chest tube. Improved left pleural effusion. No pneumothorax. 2. Left base atelectasis/infiltrate/mass. Reference made to prior PET-CT of 09/06/2019 for further discussion. Electronically Signed   By: Marcello Moores  Register   On: 08/30/2019 07:02   DG Chest 1 View  Result Date: 08/02/2019 CLINICAL DATA:  Left pleural effusion, post thoracentesis EXAM: CHEST  1 VIEW COMPARISON:  07/22/2019 FINDINGS: No pneumothorax. Trace residual left effusion suspected. Patchy consolidation/atelectasis at the left lung base as before. Right lung clear. Heart size and mediastinal contours are within normal limits. Aortic Atherosclerosis (ICD10-170.0). Visualized bones unremarkable. IMPRESSION: No pneumothorax post thoracentesis. Electronically Signed   By: Lucrezia Europe M.D.   On: 08/02/2019 15:04   DG Chest 2 View  Result  Date: 08/17/2019 CLINICAL DATA:  Short of breath, recent diagnosis of pneumonia, recent thoracentesis EXAM: CHEST - 2 VIEW COMPARISON:  08/02/2019 FINDINGS: Frontal and lateral views of the chest demonstrate increased left pleural effusion and left basilar consolidation. Right chest  is clear. No pneumothorax. Cardiac silhouette is obscured by the consolidation and effusion at the left lung base. No acute bony abnormalities. IMPRESSION: 1. Progressive left basilar consolidation and effusion. Electronically Signed   By: Randa Ngo M.D.   On: 08/17/2019 19:46   CT CHEST WO CONTRAST  Result Date: 08/19/2019 CLINICAL DATA:  Inpatient. Status post left pleural chest tube placement. Follow-up left lower lobe lung mass. EXAM: CT CHEST WITHOUT CONTRAST TECHNIQUE: Multidetector CT imaging of the chest was performed following the standard protocol without IV contrast. COMPARISON:  08/17/2019 chest CT. 08/18/2019 chest radiograph. FINDINGS: Cardiovascular: Normal heart size. No significant pericardial effusion/thickening. Three-vessel coronary atherosclerosis. Atherosclerotic nonaneurysmal thoracic aorta. Normal caliber pulmonary arteries. Mediastinum/Nodes: No discrete thyroid nodules. Unremarkable esophagus. No axillary adenopathy. Stable enlarged 1.0 cm left prevascular node (series 2/image 79) at the level of the pulmonic trunk. Stable enlarged 2.0 cm subcarinal node (series 2/image 79). Stable mild AP window adenopathy up to 1.0 cm (series 2/image 64). Stable enlarged 1.9 cm left infrahilar node (series 2/image 89). Stable enlarged 1.6 cm lower right paraesophageal node (series 2/image 128). No discrete right hilar adenopathy on these noncontrast images. Lungs/Pleura: No right pneumothorax. No right pleural effusion. Interval placement of left pleural pigtail drain terminating in the peripheral mid to lower left pleural space. Small residual left hydropneumothorax with minimal apical pneumothorax component.  Irregular nodular dependent basilar left pleural thickening, unchanged. Poorly delineated 6.7 x 4.0 cm medial basilar left lower lobe lung mass (series 2/image 121). Moderate centrilobular and paraseptal emphysema. Lingular 1.0 cm solid pulmonary nodule (series 3/image 108). Interlobular septal thickening and patchy ground-glass opacity throughout lingula and left lower lobe with residual moderate atelectasis at the left lung base. Upper abdomen: Enlarged 2.0 cm gastrohepatic ligament node (series 2/image 152). Enlarged 1.7 cm left para-aortic node (series 2/image 165). Musculoskeletal: No aggressive appearing focal osseous lesions. Mild thoracic spondylosis. IMPRESSION: 1. Interval placement of left pleural pigtail drain. Small residual left hydropneumothorax with minimal apical pneumothorax component. 2. Poorly delineated 6.7 x 4.0 cm medial basilar left lower lobe lung mass, highly suspicious for primary bronchogenic carcinoma. 3. Lingular 1.0 cm solid pulmonary nodule, suspicious for ipsilateral metastasis. 4. Interlobular septal thickening and patchy ground-glass opacity throughout the lingula and left lower lobe with residual moderate atelectasis at the left lung base. 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 5. Ipsilateral infrahilar, subcarinal, AP window, prevascular and upper retroperitoneal lymphadenopathy compatible with metastatic disease. PET-CT suggested for complete staging evaluation. 6. Three-vessel coronary atherosclerosis. 7. Aortic Atherosclerosis (ICD10-I70.0) and Emphysema (ICD10-J43.9). Electronically Signed   By: Ilona Sorrel M.D.   On: 08/19/2019 08:08   CT Chest W Contrast  Result Date: 08/17/2019 CLINICAL DATA:  Shortness of breath. EXAM: CT CHEST WITH CONTRAST TECHNIQUE: Multidetector CT imaging of the chest was performed during intravenous contrast administration. CONTRAST:  12mL OMNIPAQUE IOHEXOL 300 MG/ML  SOLN COMPARISON:  June 11, 2004 FINDINGS: Cardiovascular: There  is mild calcification of the aortic arch. The pulmonary arteries are limited in evaluation secondary to suboptimal opacification with intravenous contrast. No intraluminal filling defects are identified. Normal heart size. No pericardial effusion. Marked severity coronary artery calcification is seen Mediastinum/Nodes: Multiple subcentimeter pretracheal lymph nodes are seen. Lungs/Pleura: There is mild emphysematous lung disease. Moderate to marked severity consolidation is seen throughout the left lower lobe. A 6.3 cm x 3.4 cm x 4.1 cm heterogeneous low-attenuation soft tissue mass is seen within the posteromedial aspect of the left lung base (axial CT image 144, CT series number 2/coronal re-formatted  image 97, CT series number 5). There is a large left pleural effusion. No pneumothorax is identified. Upper Abdomen: Enlarged, necrotic lymph nodes are seen within the para-aortic and aortocaval regions. A 3.9 cm x 2.9 cm and 2.2 cm x 1.8 cm heterogeneous low-attenuation soft tissue masses are seen in between the medial aspect of the inferior vena cava and distal aspect of the descending thoracic aorta (axial CT images 137 through 135, CT series number 2). A 1.9 cm x 1.6 cm cystic appearing areas seen within the anterior aspect of the mid right kidney. Musculoskeletal: No chest wall abnormality. No acute or significant osseous findings. IMPRESSION: 1. 6.3 cm x 3.4 cm x 4.1 cm heterogeneous left lower lobe lung mass, worrisome for malignancy. 2. Moderate to marked severity left lower lobe consolidation. 3. Large left pleural effusion. 4. Enlarged, necrotic para-aortic and aortocaval lymph nodes within the chest and abdomen, consistent with metastatic disease. Aortic Atherosclerosis (ICD10-I70.0). Electronically Signed   By: Virgina Norfolk M.D.   On: 08/17/2019 21:14   MR BRAIN W WO CONTRAST  Result Date: 08/21/2019 CLINICAL DATA:  Cancer of unknown primary, staging. EXAM: MRI HEAD WITHOUT AND WITH CONTRAST  TECHNIQUE: Multiplanar, multiecho pulse sequences of the brain and surrounding structures were obtained without and with intravenous contrast. CONTRAST:  71mL GADAVIST GADOBUTROL 1 MMOL/ML IV SOLN COMPARISON:  No pertinent prior studies available for comparison. FINDINGS: Brain: Mild intermittent motion degradation. Moderate scattered T2/FLAIR hyperintensity within the cerebral white matter is nonspecific, but consistent with chronic small vessel ischemic disease. Chronic lacunar infarcts within the left corona radiata and left thalamus. Cerebral volume is normal. No abnormal intracranial enhancement is demonstrated to suggest intracranial metastatic disease. There is no acute infarct. No evidence of intracranial mass. No chronic intracranial blood products. No extra-axial fluid collection. No midline shift. Vascular: Expected proximal arterial flow voids. Skull and upper cervical spine: No focal marrow lesion. Sinuses/Orbits: Visualized orbits show no acute finding. Small left maxillary sinus mucous retention cyst. Mild ethmoid sinus mucosal thickening. Trace right mastoid effusion. IMPRESSION: 1. Mildly motion degraded examination. 2. No evidence of intracranial metastatic disease. 3. Moderate chronic small vessel ischemic changes within the cerebral white matter. Chronic lacunar infarcts within the left corona radiata and left thalamus. 4. Mild ethmoid sinus mucosal thickening. Small left maxillary sinus mucous retention cysts. 5. Trace right mastoid effusion Electronically Signed   By: Kellie Simmering DO   On: 08/21/2019 14:49   [place] CHEST (PLEURAL EFFUSION)  Result Date: 08/28/2019 CLINICAL DATA:  LUNG CANCER, PLEURAL EFFUSION, RECENT LEFT PLEURX CATHETER REMOVAL EXAM: CHEST ULTRASOUND COMPARISON:  08/19/2019 FINDINGS: ultrasound performed of the left chest. only a trace left effusion is noted with left lower lobe consolidation evident. there is not enough fluid to warrant therapeutic thoracentesis. procedure not  performed. IMPRESSION: trace left pleural effusion. left lower lobe consolidation. Electronically Signed   By: Jerilynn Mages.  Shick M.D.   On: 08/28/2019 10:29   NM PET Image Initial (PI) Skull Base To Thigh  Result Date: 08/29/2019 CLINICAL DATA:  Initial treatment strategy for small-cell lung cancer. EXAM: NUCLEAR MEDICINE PET SKULL BASE TO THIGH TECHNIQUE: 7.9 mCi F-18 FDG was injected intravenously. Full-ring PET imaging was performed from the skull base to thigh after the radiotracer. CT data was obtained and used for attenuation correction and anatomic localization. Fasting blood glucose: 100 mg/dl COMPARISON:  CT chest 08/19/2019. FINDINGS: Mediastinal blood pool activity: SUV max 1.9 Liver activity: SUV max NA NECK: No hypermetabolic lymph nodes in the neck. Incidental  CT findings: none CHEST: Diffuse uptake identified in the left lower lobe pulmonary mass, most concentrated in the paraspinal base with SUV max = 7.9. Hypermetabolic mediastinal and left hilar lymphadenopathy associated. 1.9 cm short axis subcarinal node demonstrates SUV max = 6.6. Left hilar hypermetabolic metastases demonstrate SUV max = 4.5 Hypermetabolic left pleural nodules and plaques are evident. Index 15 mm left pleural plaque (87/3) demonstrates SUV max = 2.9. Lower right para-aortic lymphadenopathy demonstrates SUV max = 6.3. Incidental CT findings: Interval decrease in left pleural effusion with evidence of loculated components on today's study. Emphysema noted in the lungs bilaterally. ABDOMEN/PELVIS: No abnormal hypermetabolic activity within the liver, pancreas, adrenal glands, or spleen. Metastatic lymphadenopathy noted upper abdomen. Index 19 mm short axis left upper abdominal node on image 150/3 demonstrates SUV max = 4.6. Incidental CT findings: There is abdominal aortic atherosclerosis without aneurysm. Small right renal cyst noted small volume intraperitoneal free fluid. SKELETON: Scattered hypermetabolic bone metastases noted.  Left acetabular lesion demonstrates SUV max = 5.4. Hypermetabolic sacral lesion demonstrates SUV max = 3.4. L5 lesion demonstrates SUV max = 3.0. Incidental CT findings: Degenerative changes noted lumbar spine. IMPRESSION: 1. Markedly hypermetabolic left lower lobe pulmonary lesion consistent with patient's known malignancy. There is associated hypermetabolic nodal metastases in the mediastinum and left hilum with metastatic involvement of left pleural space. Additional sites include upper abdominal hypermetabolic lymphadenopathy and scattered hypermetabolic bone metastases. 2. Interval decrease in left pleural effusion with areas of loculation on today's study. 3.  Aortic Atherosclerois (ICD10-170.0) 4.  Emphysema. (IFO27-X41.9) Electronically Signed   By: Misty Stanley M.D.   On: 08/29/2019 10:37   DG Chest Port 1 View  Result Date: 08/18/2019 CLINICAL DATA:  Left-sided chest tube placement. EXAM: PORTABLE CHEST 1 VIEW COMPARISON:  Aug 17, 2019 FINDINGS: Since the prior study there is been interval placement of a left-sided chest tube. Its distal tip is seen overlying the lateral aspect of the mid left lung. There is a large, stable left pleural effusion. No pneumothorax is identified. The heart size and mediastinal contours are within normal limits. Degenerative changes seen within the mid to lower thoracic spine. IMPRESSION: 1. Interval placement of a left-sided chest tube. 2. Large, stable left pleural effusion. 3. No pneumothorax. Electronically Signed   By: Virgina Norfolk M.D.   On: 08/18/2019 16:36   DG C-Arm 1-60 Min-No Report  Result Date: 08/29/2019 Fluoroscopy was utilized by the requesting physician.  No radiographic interpretation.   ECHOCARDIOGRAM COMPLETE  Result Date: 08/19/2019    ECHOCARDIOGRAM REPORT   Patient Name:   FINNEAS MATHE Date of Exam: 08/19/2019 Medical Rec #:  [id number]    Height:       74.0 in Accession #:    [id number]   Weight:       138.7 lb Date of Birth:  28-Oct-1950      BSA:          1.860 m Patient Age:    73 years     BP:           136/74 mmHg Patient Gender: M            HR:           97 bpm. Exam Location:  [facility] Procedure: 2D Echo, Color Doppler and Cardiac Doppler Indications:     Dyspnea 786.09  History:         Patient has prior history of Echocardiogram examinations, most  recent 01/14/2019. Arrythmias:Atrial Fibrillation.  Sonographer:     Sherrie Sport RDCS (AE) Referring Phys:  [id number] [address]ita Diagnosing Phys: Kathlyn Sacramento MD  Sonographer Comments: No apical window. Large bandage in apical space. IMPRESSIONS  1. Left ventricular ejection fraction, by estimation, is 50 to 55%. The left ventricle has low normal function. Left ventricular endocardial border not optimally defined to evaluate regional wall motion. There is moderate left ventricular hypertrophy. Left ventricular diastolic function could not be evaluated.  2. Right ventricular systolic function is normal. The right ventricular size is normal. Tricuspid regurgitation signal is inadequate for assessing PA pressure.  3. The mitral valve is normal in structure. No evidence of mitral valve regurgitation. No evidence of mitral stenosis.  4. The aortic valve is normal in structure. Aortic valve regurgitation is not visualized. Mild aortic valve sclerosis is present, with no evidence of aortic valve stenosis.  5. Very limited study due to lack of apical images. FINDINGS  Left Ventricle: Left ventricular ejection fraction, by estimation, is 50 to 55%. The left ventricle has low normal function. Left ventricular endocardial border not optimally defined to evaluate regional wall motion. The left ventricular internal cavity  size was normal in size. There is moderate left ventricular hypertrophy. Left ventricular diastolic function could not be evaluated. Right Ventricle: The right ventricular size is normal. No increase in right ventricular wall thickness. Right ventricular systolic function is  normal. Tricuspid regurgitation signal is inadequate for assessing PA pressure. The tricuspid regurgitant velocity is 2.52 m/s, and with an assumed right atrial pressure of 10 mmHg, the estimated right ventricular systolic pressure is 27.7 mmHg. Left Atrium: Left atrial size was normal in size. Right Atrium: Right atrial size was normal in size. Pericardium: There is no evidence of pericardial effusion. Mitral Valve: The mitral valve is normal in structure. Normal mobility of the mitral valve leaflets. No evidence of mitral valve regurgitation. No evidence of mitral valve stenosis. Tricuspid Valve: The tricuspid valve is normal in structure. Tricuspid valve regurgitation is trivial. No evidence of tricuspid stenosis. Aortic Valve: The aortic valve is normal in structure. Aortic valve regurgitation is not visualized. Mild aortic valve sclerosis is present, with no evidence of aortic valve stenosis. Pulmonic Valve: The pulmonic valve was normal in structure. Pulmonic valve regurgitation is not visualized. No evidence of pulmonic stenosis. Aorta: The aortic root is normal in size and structure. Venous: The inferior vena cava was not well visualized. IAS/Shunts: No atrial level shunt detected by color flow Doppler.  LEFT VENTRICLE PLAX 2D LVIDd:         3.12 cm LVIDs:         2.21 cm LV PW:         1.21 cm LV IVS:        1.38 cm LVOT diam:     2.00 cm LVOT Area:     3.14 cm  LEFT ATRIUM         Index LA diam:    3.40 cm 1.83 cm/m                        PULMONIC VALVE AORTA                 PV Vmax:        0.38 m/s Ao Root diam: 3.10 cm PV Peak grad:   0.6 mmHg  RVOT Peak grad: 2 mmHg  TRICUSPID VALVE TR Peak grad:   25.4 mmHg TR Vmax:        252.00 cm/s  SHUNTS Systemic Diam: 2.00 cm Kathlyn Sacramento MD Electronically signed by Kathlyn Sacramento MD Signature Date/Time: 08/19/2019/12:17:53 PM    Final    US THORACENTESIS ASP PLEURAL SPACE W/IMG GUIDE  Result Date: 08/02/2019 INDICATION: Patient with  prior history of substance abuse, CHF, pneumonia, dyspnea, left pleural effusion; request received for diagnostic and therapeutic left thoracentesis. EXAM: ULTRASOUND GUIDED DIAGNOSTIC AND THERAPEUTIC LEFT THORACENTESIS MEDICATIONS: None COMPLICATIONS: None immediate. PROCEDURE: An ultrasound guided thoracentesis was thoroughly discussed with the patient and questions answered. The benefits, risks, alternatives and complications were also discussed. The patient understands and wishes to proceed with the procedure. Written consent was obtained. Ultrasound was performed to localize and mark an adequate pocket of fluid in the left chest. The area was then prepped and draped in the normal sterile fashion. 1% Lidocaine was used for local anesthesia. Under ultrasound guidance a 6 Fr Safe-T-Centesis catheter was introduced. Thoracentesis was performed. The catheter was removed and a dressing applied. FINDINGS: A total of approximately 900 cc of blood-tinged fluid was removed. Samples were sent to the laboratory as requested by the clinical team. IMPRESSION: Successful ultrasound guided diagnostic and therapeutic left thoracentesis yielding 900 cc of pleural fluid. Follow-up chest x-ray revealed no pneumothorax. Read by: Rowe Robert, PA-C Electronically Signed   By: Lucrezia Europe M.D.   On: 08/02/2019 15:08     Assessment and plan- Patient is a 69 y.o. male who presents to [facility] for initial meeting in preparation for starting chemotherapy for the treatment of lung cancer.   1. HPI: Mr. Proehl is a 69 year old male with past medical history significant for A. fib, hypertension and substance abuse who was recently diagnosed with stage IV lung cancer.  He is status post several thoracentesis due to recurrent nonmalignant pleural effusion.  CT imaging showed a left lower lobe mass worrisome for malignancy.  Had a chest tube placed on 08/18/2019.  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  MRI of  brain was negative for malignancy.  Plan is to initiate carbo/etoposide and Tecentriq on 09/02/2019.  He had his port placed yesterday.  2. Chemo Care Clinic/High Risk for ER/Hospitalization during chemotherapy- We discussed the role of the chemo care clinic and identified patient specific risk factors. I discussed that patient was identified as high risk primarily based on: stage of disease.   Patient has past medical history positive for: Past Medical History:  Diagnosis Date  . A-fib ([place]) 01/10/2019   pt st this was dx by Dr. Ubaldo Glassing  . Cancer ([place]) 2021   LUNG  . Complication of anesthesia    DIFFICULTY WAKING UP AFTER SURGERY- 20 YRS AGO  . Hypertension   . Substance abuse ([place])     Patient has past surgical history positive for: Past Surgical History:  Procedure Laterality Date  . ARM DEBRIDEMENT Left    INCISION AND DEBRIDEMENT LOWER ARM -20 YRS AGO  . BACK SURGERY    . PORTACATH PLACEMENT Left 08/29/2019   Procedure: INSERTION PORT-A-CATH;  Surgeon: Nestor Lewandowsky, MD;  Location: [facility] ORS;  Service: General;  Laterality: Left;    Based on our high risk symptom management report; this patient has a high risk of ED utilization.  The percentage below indicates how "at risk "  this patient based on the factors in this table within one year.  General Risk Score: 6  Values used to calculate this score:   Points  Metrics      1        Age: 34      3        Hospital Admissions: 5      2        ED Visits: 2      0        Has Chronic Obstructive Pulmonary Disease: No      0        Has Diabetes: No      0        Has Congestive Heart Failure: No      0        Has liver disease: No      0        Has Depression: No      0        Current PCP: No Pcp Per Patient      0        Has Medicaid: No   3. We discussed that social determinants of health may have significant impacts on health and outcomes for cancer patients.  Today we discussed specific social determinants of performance  status, alcohol use, depression, financial needs, food insecurity, housing, interpersonal violence, social connections, stress, tobacco use, and transportation.    After lengthy discussion the following were identified as areas of need:   Patient has transportation needs.  We have already reached out to scheduling to have this managed for him.  Patient's daughter asked about something for anxiety and depression.  I reached out to [employer], NP who recommends starting him on Lexapro 10 mg daily given his cardiac history.  Prescription called into pharmacy.  Patient also has questions about when to begin his Eliquis back.  He was instructed to hold given his port needed to be placed yesterday.  He was not given instructions to restart.  I have reached out to Dr. Ubaldo Glassing to see if he can restart.  Outpatient services: We discussed options including home based and outpatient services, DME and care program. We discusssed that patients who participate in regular physical activity report fewer negative impacts of cancer and treatments and report less fatigue.   Financial Concerns: We discussed that living with cancer can create tremendous financial burden.  We discussed options for assistance. I asked that if assistance is needed in affording medications or paying bills to please let us know so that we can provide assistance. We discussed options for food including social services, [employer] ($50 every 2 weeks) and onsite food pantry.  We will also notify [facility] to see if cancer center can provide additional support.  Referral to Social work: Introduced Education officer, museum Elease Etienne and the services he can provide such as support with [employer], cell phone and gas vouchers.   Support groups: We discussed options for support groups at the cancer center. If interested, please notify nurse navigator to enroll. We discussed options for managing stress including healthy eating, exercise as well  as participating in no charge counseling services at the cancer center and support groups.  If these are of interest, patient can notify either myself or primary nursing team.We discussed options for management including medications and referral to quit Smart program  Transportation: We discussed options for transportation including acta, paratransit, bus routes, link transit, taxi/uber/lyft, and cancer center [place].  I have notified primary oncology team who will  help assist with arranging Lucianne Lei transportation for appointments when/if needed. We also discussed options for transportation on short notice/acute visits.  Palliative care services: We have palliative care services available in the cancer center to discuss goals of care and advanced care planning.  Please let us know if you have any questions or would like to speak to our palliative nurse practitioner.  Symptom Management Clinic: We discussed our symptom management clinic which is available for acute concerns while receiving treatment such as nausea, vomiting or diarrhea.  We can be reached via telephone at [phone] or through my chart.  We are available for virtual or in person visits on the same day from 830 to 4 PM Monday through Friday. He denies needing specific assistance at this time and He will be followed by Dr. Mike Gip clinical team.  Plan: Discussed symptom management clinic. Discussed palliative care services. Discussed resources that are available here at the cancer center. Transportation arranged. Discussed medications and new prescriptions to begin treatment such as anti-nausea or steroids.  Patients needs help paying for his medications. He needs a refill on his Eliquis. Dr. Ubaldo Glassing initiated eliquis. Dr. Genevive Bi held eliquis d/t port and chest tube placement. Dr. Genevive Bi has asked him to re-start his eliquis on Sunday.   Disposition: RTC on 09/02/2019 for lab work, MD assessment and cycle 1 of carbo/etoposide plus Tecentriq.  Visit  Diagnosis 1. Recurrent pleural effusion on left     Patient expressed understanding and was in agreement with this plan. He also understands that He can call clinic at any time with any questions, concerns, or complaints.   Greater than 50% was spent in counseling and coordination of care with this patient including but not limited to discussion of the relevant topics above (See A&P) including, but not limited to diagnosis and management of acute and chronic medical conditions.   [facility]  CC: Dr. Mike Gip

## 2019-08-30 NOTE — Progress Notes (Signed)
  Oncology Nurse Navigator Documentation  Navigator Location: [facility] (08/30/19 1000)   )Navigator Encounter Type: Lobby (08/30/19 1000)                     Patient Visit Type: (Chemo class) (08/30/19 1000)   Barriers/Navigation Needs: Education (08/30/19 1000) Education: Newly Diagnosed Cancer Education;Understanding Cancer/ Treatment Options (08/30/19 1000) Interventions: Education (08/30/19 1000)     Education Method: Verbal;Written (08/30/19 1000)         met with patient during chemo class to introduce to navigator services. Resources given regarding diagnosis and supportive services available. Contact info given and instructed to call with any further questions or needs. Pt verbalized understanding. Nothing further needed at this time. Will follow up during chemo visit next week.       Time Spent with Patient: 30 (08/30/19 1000)

## 2019-08-30 NOTE — Anesthesia Postprocedure Evaluation (Signed)
Anesthesia Post Note  Patient: Scott Jordan  Procedure(s) Performed: INSERTION PORT-A-CATH (Left )  Patient location during evaluation: PACU Anesthesia Type: General Level of consciousness: awake and alert Pain management: pain level controlled Vital Signs Assessment: post-procedure vital signs reviewed and stable Respiratory status: spontaneous breathing, nonlabored ventilation, respiratory function stable and patient connected to nasal cannula oxygen Cardiovascular status: blood pressure returned to baseline and stable Postop Assessment: no apparent nausea or vomiting Anesthetic complications: no     Last Vitals:  Vitals:   08/29/19 1422 08/29/19 1432  BP: (!) 148/97 (!) 143/81  Pulse: (!) 54 65  Resp: 20 18  Temp:  (!) 36.2 C  SpO2: 96% 95%    Last Pain:  Vitals:   08/29/19 1432  TempSrc: Temporal  PainSc: 0-No pain                 Precious Haws Lynnae Ludemann

## 2019-09-02 ENCOUNTER — Encounter: Payer: Self-pay | Admitting: Hematology and Oncology

## 2019-09-02 ENCOUNTER — Inpatient Hospital Stay (HOSPITAL_BASED_OUTPATIENT_CLINIC_OR_DEPARTMENT_OTHER): Payer: Medicare Other | Admitting: Hematology and Oncology

## 2019-09-02 ENCOUNTER — Other Ambulatory Visit: Payer: Self-pay

## 2019-09-02 ENCOUNTER — Inpatient Hospital Stay: Payer: Medicare Other

## 2019-09-02 ENCOUNTER — Telehealth: Payer: Self-pay | Admitting: *Deleted

## 2019-09-02 VITALS — BP 153/92 | HR 88 | Temp 97.6°F | Resp 18

## 2019-09-02 VITALS — BP 138/90 | HR 100 | Temp 97.1°F | Resp 16 | Wt 147.2 lb

## 2019-09-02 DIAGNOSIS — E871 Hypo-osmolality and hyponatremia: Secondary | ICD-10-CM

## 2019-09-02 DIAGNOSIS — Z5189 Encounter for other specified aftercare: Secondary | ICD-10-CM | POA: Diagnosis not present

## 2019-09-02 DIAGNOSIS — J90 Pleural effusion, not elsewhere classified: Secondary | ICD-10-CM | POA: Diagnosis not present

## 2019-09-02 DIAGNOSIS — C3432 Malignant neoplasm of lower lobe, left bronchus or lung: Secondary | ICD-10-CM | POA: Diagnosis not present

## 2019-09-02 DIAGNOSIS — Z79899 Other long term (current) drug therapy: Secondary | ICD-10-CM | POA: Diagnosis not present

## 2019-09-02 DIAGNOSIS — Z5112 Encounter for antineoplastic immunotherapy: Secondary | ICD-10-CM | POA: Diagnosis not present

## 2019-09-02 DIAGNOSIS — C3490 Malignant neoplasm of unspecified part of unspecified bronchus or lung: Secondary | ICD-10-CM | POA: Diagnosis not present

## 2019-09-02 DIAGNOSIS — J910 Malignant pleural effusion: Secondary | ICD-10-CM

## 2019-09-02 DIAGNOSIS — Z7189 Other specified counseling: Secondary | ICD-10-CM

## 2019-09-02 DIAGNOSIS — Z5111 Encounter for antineoplastic chemotherapy: Secondary | ICD-10-CM | POA: Diagnosis not present

## 2019-09-02 DIAGNOSIS — I110 Hypertensive heart disease with heart failure: Secondary | ICD-10-CM | POA: Diagnosis not present

## 2019-09-02 DIAGNOSIS — F17210 Nicotine dependence, cigarettes, uncomplicated: Secondary | ICD-10-CM | POA: Diagnosis not present

## 2019-09-02 DIAGNOSIS — J189 Pneumonia, unspecified organism: Secondary | ICD-10-CM | POA: Diagnosis not present

## 2019-09-02 DIAGNOSIS — I509 Heart failure, unspecified: Secondary | ICD-10-CM | POA: Diagnosis not present

## 2019-09-02 DIAGNOSIS — C7951 Secondary malignant neoplasm of bone: Secondary | ICD-10-CM | POA: Insufficient documentation

## 2019-09-02 DIAGNOSIS — I4891 Unspecified atrial fibrillation: Secondary | ICD-10-CM | POA: Diagnosis not present

## 2019-09-02 DIAGNOSIS — Z7901 Long term (current) use of anticoagulants: Secondary | ICD-10-CM | POA: Diagnosis not present

## 2019-09-02 DIAGNOSIS — G893 Neoplasm related pain (acute) (chronic): Secondary | ICD-10-CM | POA: Diagnosis not present

## 2019-09-02 LAB — CBC WITH DIFFERENTIAL/PLATELET
Abs Immature Granulocytes: 0.19 10*3/uL — ABNORMAL HIGH (ref 0.00–0.07)
Basophils Absolute: 0 10*3/uL (ref 0.0–0.1)
Basophils Relative: 0 %
Eosinophils Absolute: 0.1 10*3/uL (ref 0.0–0.5)
Eosinophils Relative: 1 %
HCT: 34.1 % — ABNORMAL LOW (ref 39.0–52.0)
Hemoglobin: 11.7 g/dL — ABNORMAL LOW (ref 13.0–17.0)
Immature Granulocytes: 2 %
Lymphocytes Relative: 16 %
Lymphs Abs: 1.3 10*3/uL (ref 0.7–4.0)
MCH: 33.5 pg (ref 26.0–34.0)
MCHC: 34.3 g/dL (ref 30.0–36.0)
MCV: 97.7 fL (ref 80.0–100.0)
Monocytes Absolute: 0.9 10*3/uL (ref 0.1–1.0)
Monocytes Relative: 11 %
Neutro Abs: 5.9 10*3/uL (ref 1.7–7.7)
Neutrophils Relative %: 70 %
Platelets: 310 10*3/uL (ref 150–400)
RBC: 3.49 MIL/uL — ABNORMAL LOW (ref 4.22–5.81)
RDW: 12.3 % (ref 11.5–15.5)
WBC: 8.5 10*3/uL (ref 4.0–10.5)
nRBC: 0 % (ref 0.0–0.2)

## 2019-09-02 LAB — COMPREHENSIVE METABOLIC PANEL
ALT: 15 U/L (ref 0–44)
AST: 25 U/L (ref 15–41)
Albumin: 3.3 g/dL — ABNORMAL LOW (ref 3.5–5.0)
Alkaline Phosphatase: 65 U/L (ref 38–126)
Anion gap: 7 (ref 5–15)
BUN: 8 mg/dL (ref 8–23)
CO2: 28 mmol/L (ref 22–32)
Calcium: 8.5 mg/dL — ABNORMAL LOW (ref 8.9–10.3)
Chloride: 94 mmol/L — ABNORMAL LOW (ref 98–111)
Creatinine, Ser: 0.57 mg/dL — ABNORMAL LOW (ref 0.61–1.24)
GFR calc Af Amer: >60 mL/min (ref >60)
GFR calc non Af Amer: >60 mL/min (ref >60)
Glucose, Bld: 107 mg/dL — ABNORMAL HIGH (ref 70–99)
Potassium: 4.2 mmol/L (ref 3.5–5.1)
Sodium: 129 mmol/L — ABNORMAL LOW (ref 135–145)
Total Bilirubin: 0.6 mg/dL (ref 0.3–1.2)
Total Protein: 6.7 g/dL (ref 6.5–8.1)

## 2019-09-02 LAB — MAGNESIUM: Magnesium: 1.8 mg/dL (ref 1.7–2.4)

## 2019-09-02 MED ORDER — PALONOSETRON HCL INJECTION 0.25 MG/5ML
0.2500 mg | Freq: Once | INTRAVENOUS | Status: AC
  Administered 2019-09-02: 0.25 mg via INTRAVENOUS
  Filled 2019-09-02: qty 5

## 2019-09-02 MED ORDER — SODIUM CHLORIDE 0.9 % IV SOLN
Freq: Once | INTRAVENOUS | Status: AC
  Filled 2019-09-02: qty 250

## 2019-09-02 MED ORDER — SODIUM CHLORIDE 0.9 % IV SOLN
1200.0000 mg | Freq: Once | INTRAVENOUS | Status: AC
  Administered 2019-09-02: 1200 mg via INTRAVENOUS
  Filled 2019-09-02: qty 20

## 2019-09-02 MED ORDER — NYSTATIN 100000 UNIT/ML MT SUSP: 5.0000 mL | Freq: Four times a day (QID) | OROMUCOSAL | 0 refills | Status: DC

## 2019-09-02 MED ORDER — HYDROCODONE-ACETAMINOPHEN 5-325 MG PO TABS: 1.0000 | ORAL_TABLET | Freq: Four times a day (QID) | ORAL | 0 refills | Status: DC | PRN

## 2019-09-02 MED ORDER — SODIUM CHLORIDE 0.9 % IV SOLN
100.0000 mg/m2 | Freq: Once | INTRAVENOUS | Status: DC
  Filled 2019-09-02: qty 9

## 2019-09-02 MED ORDER — SODIUM CHLORIDE 0.9 % IV SOLN
444.0000 mg | Freq: Once | INTRAVENOUS | Status: AC
  Administered 2019-09-02: 440 mg via INTRAVENOUS
  Filled 2019-09-02: qty 44

## 2019-09-02 MED ORDER — SODIUM CHLORIDE 0.9 % IV SOLN
10.0000 mg | Freq: Once | INTRAVENOUS | Status: AC
  Administered 2019-09-02: 10 mg via INTRAVENOUS
  Filled 2019-09-02: qty 10

## 2019-09-02 MED ORDER — HEPARIN SOD (PORK) LOCK FLUSH 100 UNIT/ML IV SOLN
500.0000 [IU] | Freq: Once | INTRAVENOUS | Status: AC | PRN
  Administered 2019-09-02: 500 [IU]
  Filled 2019-09-02: qty 5

## 2019-09-02 MED ORDER — SODIUM CHLORIDE 0.9 % IV SOLN
75.0000 mg/m2 | Freq: Once | INTRAVENOUS | Status: AC
  Administered 2019-09-02: 140 mg via INTRAVENOUS
  Filled 2019-09-02: qty 7

## 2019-09-02 MED ORDER — SODIUM CHLORIDE 0.9 % IV SOLN
150.0000 mg | Freq: Once | INTRAVENOUS | Status: AC
  Administered 2019-09-02: 150 mg via INTRAVENOUS
  Filled 2019-09-02: qty 5

## 2019-09-02 NOTE — Telephone Encounter (Signed)
RN received a message from Scott Abide NP that she has gotten patients eliquis called into [facility]. I informed patient that he may pick this prescription up today at no charge to him. He states he does know where [facility] is located. He will have his niece pick that prescription up.

## 2019-09-02 NOTE — Patient Instructions (Signed)
Denosumab injection What is this medicine? DENOSUMAB (den oh sue mab) slows bone breakdown. Prolia is used to treat osteoporosis in women after menopause and in men, and in people who are taking corticosteroids for 6 months or more. Xgeva is used to treat a high calcium level due to cancer and to prevent bone fractures and other bone problems caused by multiple myeloma or cancer bone metastases. Xgeva is also used to treat giant cell tumor of the bone. This medicine may be used for other purposes; ask your health care provider or pharmacist if you have questions. COMMON BRAND NAME(S): Prolia, XGEVA What should I tell my health care provider before I take this medicine? They need to know if you have any of these conditions:  dental disease  having surgery or tooth extraction  infection  kidney disease  low levels of calcium or Vitamin D in the blood  malnutrition  on hemodialysis  skin conditions or sensitivity  thyroid or parathyroid disease  an unusual reaction to denosumab, other medicines, foods, dyes, or preservatives  pregnant or trying to get pregnant  breast-feeding How should I use this medicine? This medicine is for injection under the skin. It is given by a health care professional in a hospital or clinic setting. A special MedGuide will be given to you before each treatment. Be sure to read this information carefully each time. For Prolia, talk to your pediatrician regarding the use of this medicine in children. Special care may be needed. For Xgeva, talk to your pediatrician regarding the use of this medicine in children. While this drug may be prescribed for children as young as 13 years for selected conditions, precautions do apply. Overdosage: If you think you have taken too much of this medicine contact a poison control center or emergency room at once. NOTE: This medicine is only for you. Do not share this medicine with others. What if I miss a dose? It is  important not to miss your dose. Call your doctor or health care professional if you are unable to keep an appointment. What may interact with this medicine? Do not take this medicine with any of the following medications:  other medicines containing denosumab This medicine may also interact with the following medications:  medicines that lower your chance of fighting infection  steroid medicines like prednisone or cortisone This list may not describe all possible interactions. Give your health care provider a list of all the medicines, herbs, non-prescription drugs, or dietary supplements you use. Also tell them if you smoke, drink alcohol, or use illegal drugs. Some items may interact with your medicine. What should I watch for while using this medicine? Visit your doctor or health care professional for regular checks on your progress. Your doctor or health care professional may order blood tests and other tests to see how you are doing. Call your doctor or health care professional for advice if you get a fever, chills or sore throat, or other symptoms of a cold or flu. Do not treat yourself. This drug may decrease your body's ability to fight infection. Try to avoid being around people who are sick. You should make sure you get enough calcium and vitamin D while you are taking this medicine, unless your doctor tells you not to. Discuss the foods you eat and the vitamins you take with your health care professional. See your dentist regularly. Brush and floss your teeth as directed. Before you have any dental work done, tell your dentist you are   receiving this medicine. Do not become pregnant while taking this medicine or for 5 months after stopping it. Talk with your doctor or health care professional about your birth control options while taking this medicine. Women should inform their doctor if they wish to become pregnant or think they might be pregnant. There is a potential for serious side  effects to an unborn child. Talk to your health care professional or pharmacist for more information. What side effects may I notice from receiving this medicine? Side effects that you should report to your doctor or health care professional as soon as possible:  allergic reactions like skin rash, itching or hives, swelling of the face, lips, or tongue  bone pain  breathing problems  dizziness  jaw pain, especially after dental work  redness, blistering, peeling of the skin  signs and symptoms of infection like fever or chills; cough; sore throat; pain or trouble passing urine  signs of low calcium like fast heartbeat, muscle cramps or muscle pain; pain, tingling, numbness in the hands or feet; seizures  unusual bleeding or bruising  unusually weak or tired Side effects that usually do not require medical attention (report to your doctor or health care professional if they continue or are bothersome):  constipation  diarrhea  headache  joint pain  loss of appetite  muscle pain  runny nose  tiredness  upset stomach This list may not describe all possible side effects. Call your doctor for medical advice about side effects. You may report side effects to FDA at 1-800-FDA-1088. Where should I keep my medicine? This medicine is only given in a clinic, doctor's office, or other health care setting and will not be stored at home. NOTE: This sheet is a summary. It may not cover all possible information. If you have questions about this medicine, talk to your doctor, pharmacist, or health care provider.  2020 Elsevier/Gold Standard (2017-08-04 16:10:44)

## 2019-09-02 NOTE — Progress Notes (Signed)
Patient here for follow up. Reports he is having LLE edema and pain starting 2-3 days ago. Pain is primarily in left hip/thigh area. Denies any other concerns.

## 2019-09-03 ENCOUNTER — Inpatient Hospital Stay: Payer: Medicare Other

## 2019-09-03 ENCOUNTER — Telehealth: Payer: Self-pay

## 2019-09-03 ENCOUNTER — Encounter: Payer: Self-pay | Admitting: *Deleted

## 2019-09-03 VITALS — BP 129/75 | HR 86 | Temp 98.0°F | Resp 20

## 2019-09-03 DIAGNOSIS — I110 Hypertensive heart disease with heart failure: Secondary | ICD-10-CM | POA: Diagnosis not present

## 2019-09-03 DIAGNOSIS — C3432 Malignant neoplasm of lower lobe, left bronchus or lung: Secondary | ICD-10-CM | POA: Diagnosis not present

## 2019-09-03 DIAGNOSIS — I4891 Unspecified atrial fibrillation: Secondary | ICD-10-CM | POA: Diagnosis not present

## 2019-09-03 DIAGNOSIS — E871 Hypo-osmolality and hyponatremia: Secondary | ICD-10-CM | POA: Diagnosis not present

## 2019-09-03 DIAGNOSIS — Z7901 Long term (current) use of anticoagulants: Secondary | ICD-10-CM | POA: Diagnosis not present

## 2019-09-03 DIAGNOSIS — G893 Neoplasm related pain (acute) (chronic): Secondary | ICD-10-CM | POA: Diagnosis not present

## 2019-09-03 DIAGNOSIS — C3490 Malignant neoplasm of unspecified part of unspecified bronchus or lung: Secondary | ICD-10-CM

## 2019-09-03 DIAGNOSIS — I509 Heart failure, unspecified: Secondary | ICD-10-CM | POA: Diagnosis not present

## 2019-09-03 DIAGNOSIS — Z79899 Other long term (current) drug therapy: Secondary | ICD-10-CM | POA: Diagnosis not present

## 2019-09-03 DIAGNOSIS — Z5111 Encounter for antineoplastic chemotherapy: Secondary | ICD-10-CM | POA: Diagnosis not present

## 2019-09-03 DIAGNOSIS — F17210 Nicotine dependence, cigarettes, uncomplicated: Secondary | ICD-10-CM | POA: Diagnosis not present

## 2019-09-03 DIAGNOSIS — J189 Pneumonia, unspecified organism: Secondary | ICD-10-CM | POA: Diagnosis not present

## 2019-09-03 DIAGNOSIS — Z5189 Encounter for other specified aftercare: Secondary | ICD-10-CM | POA: Diagnosis not present

## 2019-09-03 DIAGNOSIS — Z5112 Encounter for antineoplastic immunotherapy: Secondary | ICD-10-CM | POA: Diagnosis not present

## 2019-09-03 DIAGNOSIS — J90 Pleural effusion, not elsewhere classified: Secondary | ICD-10-CM | POA: Diagnosis not present

## 2019-09-03 LAB — FUNGUS CULTURE WITH STAIN

## 2019-09-03 LAB — T4: T4, Total: 7.9 ug/dL (ref 4.5–12.0)

## 2019-09-03 LAB — FUNGAL ORGANISM REFLEX

## 2019-09-03 LAB — FUNGUS CULTURE RESULT

## 2019-09-03 LAB — CEA: CEA: 3.7 ng/mL (ref 0.0–4.7)

## 2019-09-03 MED ORDER — SODIUM CHLORIDE 0.9 % IV SOLN
Freq: Once | INTRAVENOUS | Status: AC
  Filled 2019-09-03: qty 250

## 2019-09-03 MED ORDER — SODIUM CHLORIDE 0.9 % IV SOLN
10.0000 mg | Freq: Once | INTRAVENOUS | Status: AC
  Administered 2019-09-03: 10 mg via INTRAVENOUS
  Filled 2019-09-03: qty 1

## 2019-09-03 MED ORDER — HEPARIN SOD (PORK) LOCK FLUSH 100 UNIT/ML IV SOLN
500.0000 [IU] | Freq: Once | INTRAVENOUS | Status: AC | PRN
  Administered 2019-09-03: 500 [IU]
  Filled 2019-09-03: qty 5

## 2019-09-03 MED ORDER — SODIUM CHLORIDE 0.9 % IV SOLN
140.0000 mg | Freq: Once | INTRAVENOUS | Status: AC
  Administered 2019-09-03: 140 mg via INTRAVENOUS
  Filled 2019-09-03: qty 7

## 2019-09-03 NOTE — Progress Notes (Signed)
Albumin 3.3. Per MD will reduce etoposide dose by 25% for albumin less than 3.5.

## 2019-09-03 NOTE — Progress Notes (Signed)
[facility]  [address], [address] [place], [place] [address] Phone: [phone]  Fax: [phone]   Clinic Day:  09/05/2019  Referring physician: No ref. provider found  Chief Complaint: Scott Jordan is a 69 y.o. male with extensive stage small cell lung cancer who is seen for assessment on day 4 of cycle #1 carboplatin, etoposide and Tecentriq.  HPI:  The patient was last seen in the medical oncology clinic on 09/02/2019. At that time, he felt "alright".   He noted left hip pain.  Exam revealed thrush.  Sodium was 129.  We discussed free water restriction and drinking fluids with electrolytes.  He began cycle #1 carboplatin, etoposide, and Tecentriq.   He received etoposide on 09/03/2019 and 09/04/2019.  Sodium was 126 on 09/04/2019.  He was drinking three 12 ounce bottles of water/day.  Sodium tablets 1 gm TID were prescribed.  Lasix 20 mg q day was provided.  Nephrology was consulted.  During the interim, the patient has felt fine. He feels tired and reports that he been sleeping more. He has no nausea or vomiting. He has not used nausea medication. He does feel anxious. He notes using Magicwash TID x 2 days and thrush significantly improved. He will use the Magic mouthwash today. He is having a hard time getting phlegm out. He started his salt tables TID as directed. His is voiding well with associated dark stool. He feels like the left ankle swelling has gone down. He keeps his legs elevated. He has cut back on water. He continues to feel short of breath on exertion.   I encouraged patient to call the clinic if he has any concerns.    Past Medical History:  Diagnosis Date  . A-fib ([place]) 01/10/2019   pt st this was dx by Dr. Ubaldo Glassing  . Cancer ([place]) 2021   LUNG  . Complication of anesthesia    DIFFICULTY WAKING UP AFTER SURGERY- 20 YRS AGO  . Hypertension   . Substance abuse [facility])     Past Surgical History:  Procedure Laterality Date  . ARM DEBRIDEMENT  Left    INCISION AND DEBRIDEMENT LOWER ARM -20 YRS AGO  . BACK SURGERY    . PORTACATH PLACEMENT Left 08/29/2019   Procedure: INSERTION PORT-A-CATH;  Surgeon: Nestor Lewandowsky, MD;  Location: [facility] ORS;  Service: General;  Laterality: Left;    History reviewed. No pertinent family history.  Social History:  reports that he has been smoking cigarettes. He has been smoking about 0.50 packs per day. He has never used smokeless tobacco. He reports current alcohol use. He reports current drug use. Drug: Heroin. The patient denies any exposure to radiation or toxins.  The patient lives in [place]. He has family friends staying with him to help care for his needs. His niece , [name] [phone]), is his medical power of attorney. The patient is alone today.  Allergies: No Known Allergies  Current Medications: Current Outpatient Medications  Medication Sig Dispense Refill  . diltiazem (CARDIZEM CD) 240 MG 24 hr capsule Take 1 capsule (240 mg total) by mouth daily. 30 capsule 2  . ELIQUIS 5 MG TABS tablet Take 5 mg by mouth 2 (two) times daily.    Marland Kitchen escitalopram (LEXAPRO) 10 MG tablet Take 1 tablet (10 mg total) by mouth daily. 90 tablet 0  . feeding supplement, ENSURE ENLIVE, (ENSURE ENLIVE) LIQD Take 237 mLs by mouth 3 (three) times daily between meals. 237 mL 12  . furosemide (LASIX)  20 MG tablet Take 1 tablet (20 mg total) by mouth daily. 20 tablet 1  . HYDROcodone-acetaminophen (NORCO/VICODIN) 5-325 MG tablet Take 1 tablet by mouth every 6 (six) hours as needed for moderate pain. 30 tablet 0  . lidocaine-prilocaine (EMLA) cream Apply to affected area once 30 g 3  . losartan (COZAAR) 25 MG tablet Take 1 tablet (25 mg total) by mouth daily. 30 tablet 2  . nystatin (MYCOSTATIN) 100000 UNIT/ML suspension Use as directed 5 mLs (500,000 Units total) in the mouth or throat 4 (four) times daily. Swish and spit 60 mL 0  . ondansetron (ZOFRAN) 8 MG tablet Take 1 tablet (8 mg total) by mouth every 8  (eight) hours as needed for refractory nausea / vomiting. Start on day 3 after carboplatin chemo. 30 tablet 1  . sodium chloride 1 g tablet Take 1 tablet (1 g total) by mouth 3 (three) times daily with meals. 30 tablet 0   No current facility-administered medications for this visit.    Review of Systems  Constitutional: Positive for malaise/fatigue. Negative for chills, diaphoresis, fever and weight loss (stable).       Feels fine.  HENT: Negative for congestion, ear discharge, ear pain, hearing loss, nosebleeds, sinus pain, sore throat and tinnitus.   Eyes: Negative.  Negative for blurred vision, double vision and photophobia.  Respiratory: Positive for cough, sputum production (thick white phlegm) and shortness of breath (chronic). Negative for hemoptysis.        Hard time coughing up phlegm.  Cardiovascular: Negative for chest pain, palpitations and leg swelling (left ankle; improving).  Gastrointestinal: Positive for melena (dark stool). Negative for abdominal pain, blood in stool, constipation, diarrhea, heartburn, nausea (a few days ago) and vomiting.       Drinking 2 ensure per day.  Genitourinary: Negative for dysuria, flank pain, frequency, hematuria and urgency.       Voiding well.  Musculoskeletal: Negative for back pain, joint pain, myalgias and neck pain.  Skin: Negative for itching and rash.  Neurological: Negative for dizziness, tingling, sensory change, weakness and headaches.  Endo/Heme/Allergies: Does not bruise/bleed easily.  Psychiatric/Behavioral: Negative for depression and memory loss. The patient is nervous/anxious. The patient does not have insomnia.   All other systems reviewed and are negative.  Performance status (ECOG):  2  Vitals Blood pressure 111/73, pulse 88, temperature (!) 96.7 F (35.9 C), temperature source Tympanic, resp. rate 16, weight 148 lb 2.4 oz (67.2 kg), SpO2 93 %.   Physical Exam Vitals and nursing note reviewed.  Constitutional:       Appearance: He is well-developed and well-nourished. He is not diaphoretic.     Comments: Thin, chronically fatigued appearing gentleman sitting comfortably in wheelchair in no acute distress.  HENT:     Head: Normocephalic and atraumatic.     Mouth/Throat:     Mouth: Oropharynx is clear and moist.     Pharynx: No oropharyngeal exudate.      Comments: Cap.  Brown hair.  Graying goatee.  Mask. Trush, 80% improved. Mask. Eyes:     General: No scleral icterus.    Extraocular Movements: EOM normal.     Conjunctiva/sclera: Conjunctivae normal.     Pupils: Pupils are equal, round, and reactive to light.     Comments: Blue eyes.  Neck:     Vascular: No JVD.  Cardiovascular:     Rate and Rhythm: Normal rate and regular rhythm.     Heart sounds: Normal heart sounds. No murmur heard.  Pulmonary:     Effort: Pulmonary effort is normal. No respiratory distress.     Breath sounds: Normal breath sounds. No wheezing or rales.     Comments: Decreased breath sounds left base. Chest:     Chest wall: No tenderness.  Abdominal:     General: Bowel sounds are normal. There is no distension.     Palpations: Abdomen is soft. There is no mass.     Tenderness: There is no abdominal tenderness. There is no guarding or rebound.  Musculoskeletal:        General: No tenderness or edema. Normal range of motion.     Cervical back: Normal range of motion and neck supple.  Lymphadenopathy:     Head:     Right side of head: No preauricular, posterior auricular or occipital adenopathy.     Left side of head: No preauricular, posterior auricular or occipital adenopathy.     Cervical: No cervical adenopathy.     Upper Body:  No axillary adenopathy present.    Right upper body: No supraclavicular adenopathy.     Left upper body: No supraclavicular adenopathy.     Lower Body: No right inguinal adenopathy. No left inguinal adenopathy.  Skin:    General: Skin is warm and dry.     Coloration: Skin is not pale.      Findings: No erythema or rash.  Neurological:     Mental Status: He is alert and oriented to person, place, and time.  Psychiatric:        Mood and Affect: Mood and affect normal.        Behavior: Behavior normal.        Thought Content: Thought content normal.        Judgment: Judgment normal.    Infusion on 09/04/2019  Component Date Value Ref Range Status  . Sodium 09/04/2019 126* 135 - 145 mmol/L Final  . Potassium 09/04/2019 4.3  3.5 - 5.1 mmol/L Final  . Chloride 09/04/2019 91* 98 - 111 mmol/L Final  . CO2 09/04/2019 25  22 - 32 mmol/L Final  . Glucose, Bld 09/04/2019 156* 70 - 99 mg/dL Final   Glucose reference range applies only to samples taken after fasting for at least 8 hours.  . BUN 09/04/2019 17  8 - 23 mg/dL Final  . Creatinine, Ser 09/04/2019 0.63  0.61 - 1.24 mg/dL Final  . Calcium 09/04/2019 8.6* 8.9 - 10.3 mg/dL Final  . GFR calc non Af Amer 09/04/2019 >60  >60 mL/min Final  . GFR calc Af Amer 09/04/2019 >60  >60 mL/min Final  . Anion gap 09/04/2019 10  5 - 15 Final   Performed at [facility], [address] [place], [place] [address]    Assessment:  Scott Jordan is a 69 y.o. male with extensive stage small cell lung cancer.  He has a >40 pack year smoking.  He presented with a recurrent left sided pleural effusion. He has undergone thoracentesis x 2 in the past month. He is s/p pigtail drain. Initial cytology x 2 was negative (lymphocytic exudative effusion). Pleural fluid cytologyon 08/18/2019 confirmed small cell lung cancer.   Chest CT without contraston 08/19/2019 revealed the left pleural pigtail drain. There was a small residual left hydropneumothorax. There was a 6.7 x 4 cm medial basilar left lower lobe lung masssuspicious for primary bronchogenic carcinoma. There was a lingular 1.0 cm solid pulmonary nodulesuspicious for ipsilateral metastasis. There was intralobular septal thickening and patchy groundglass  opacity throughout  the lingula and the left lower lobe with moderate residual atelectasis of the left lung base. A component of lymphangitic carcinomatosis was suspected. There was ipsilateral infra hilar, subcarinal, AP window, prevascular and upper retroperitoneal adenopathyc/wmetastatic disease.  Head MRI on 08/21/2019 revealed no evidence of metastatic disease.  PET scan on 05/20/20201 revealed markedly hypermetabolic left lower lobe pulmonary lesion (SUV 7.9) c/w the patient's known malignancy. There was associated hypermetabolic nodal metastases in the mediastinum and left hilum with metastatic involvement of left pleural space. Additional sites included upper abdominal hypermetabolic lymphadenopathy and scattered hypermetabolic bone metastases (left acetabulum, sacrum, and L5). There was interval decrease in left pleural effusion with areas of loculation.  He is day 4 of cycle #1 carboplatin, etoposide and Tecentriq (started 09/02/2019).  He has hyponatremia likely secondary to SIADH.  He is on salt tablets 1 gm TID.  He has a significant family history of malignancy. A family member has had genetic testing (negative for Lynch syndrome).  Symptomatically, he is doing well.  Ritta Slot is improving.  He denies any nausea or vomiting.  Plan: 1.   Labs today: CBC with diff, CMP, Mg, CEA. 2.   Metastatic small cell lung cancer             Clinical stage T3N2M1 (stage IV). Pleural fluid cytology confirmed small cell lung cancer. Head MRI was negative for CNS metastasis. Plan for treatment:                         Carboplatin and etoposide + PDL-1 antibody (atezolizumab) x 4 cycles followed by [employer].                         Response rates 60%.  Median OS 12.3 months.  One year survival 51.7%.  Median PFS 5.2 months.   Treatment is palliative.   He is day 4 of cycle #1 carboplatin, etoposide, and Tecentriq.   He has tolerated his chemotherapy  well. 2.Post-obstructive pneumonia Patient completed antibiotics.  Continue to monitor. 3.Left pleural effusion  Patient is s/p thoracentesis x 2 followed by pig tail drainage in hospital.  Patient to contact clinic if pleural fluid increases and requires thoracentesis. 4.   Hyponatremia secondary to SIADH Sodium 127. Etiology felt secondary to SIADH.  Continue fluids with electrolyes and free water restriction.  Continue salt tablets TID. 5. Cancer-related pain  Pain well controlled with Lortab. 6.   History of atrial fibrillation  Patient on Eliquis.  Ensure platelet count > 50,000 on anticoagulation.. 7. Thrush  Continue Nystatin swish and spit. 8.   RTC on 09/11/2019 for MD assessment and labs.  I discussed the assessment and treatment plan with the patient.  The patient was provided an opportunity to ask questions and all were answered.  The patient agreed with the plan and demonstrated an understanding of the instructions.  The patient was advised to call back if the symptoms worsen or if the condition fails to improve as anticipated.   Klay Sobotka C. Mike Gip, MD, PhD    09/05/2019, 2:46 PM  I, Selena Batten, am acting as scribe for [employer]. Mike Gip, MD, PhD.  I, Emmamarie Kluender C. Mike Gip, MD, have reviewed the above documentation for accuracy and completeness, and I agree with the above.

## 2019-09-03 NOTE — Telephone Encounter (Signed)
Telephone call to patient for follow up after receiving first infusion.   Patient states infusion went great.  States eating and drinking as good as he can.    Denies any nausea or vomiting.  Encouraged patient to call for any concerns or questions.

## 2019-09-04 ENCOUNTER — Inpatient Hospital Stay: Payer: Medicare Other

## 2019-09-04 ENCOUNTER — Encounter: Payer: Self-pay | Admitting: Hematology and Oncology

## 2019-09-04 ENCOUNTER — Other Ambulatory Visit: Payer: Self-pay

## 2019-09-04 VITALS — BP 136/79 | HR 96 | Temp 98.0°F | Resp 20

## 2019-09-04 DIAGNOSIS — Z5112 Encounter for antineoplastic immunotherapy: Secondary | ICD-10-CM

## 2019-09-04 DIAGNOSIS — I110 Hypertensive heart disease with heart failure: Secondary | ICD-10-CM | POA: Diagnosis not present

## 2019-09-04 DIAGNOSIS — C7951 Secondary malignant neoplasm of bone: Secondary | ICD-10-CM

## 2019-09-04 DIAGNOSIS — Z7189 Other specified counseling: Secondary | ICD-10-CM

## 2019-09-04 DIAGNOSIS — Z79899 Other long term (current) drug therapy: Secondary | ICD-10-CM | POA: Diagnosis not present

## 2019-09-04 DIAGNOSIS — J189 Pneumonia, unspecified organism: Secondary | ICD-10-CM | POA: Diagnosis not present

## 2019-09-04 DIAGNOSIS — J90 Pleural effusion, not elsewhere classified: Secondary | ICD-10-CM | POA: Diagnosis not present

## 2019-09-04 DIAGNOSIS — C3490 Malignant neoplasm of unspecified part of unspecified bronchus or lung: Secondary | ICD-10-CM

## 2019-09-04 DIAGNOSIS — E871 Hypo-osmolality and hyponatremia: Secondary | ICD-10-CM

## 2019-09-04 DIAGNOSIS — Z5111 Encounter for antineoplastic chemotherapy: Secondary | ICD-10-CM

## 2019-09-04 DIAGNOSIS — I509 Heart failure, unspecified: Secondary | ICD-10-CM | POA: Diagnosis not present

## 2019-09-04 DIAGNOSIS — Z7901 Long term (current) use of anticoagulants: Secondary | ICD-10-CM | POA: Diagnosis not present

## 2019-09-04 DIAGNOSIS — G893 Neoplasm related pain (acute) (chronic): Secondary | ICD-10-CM | POA: Diagnosis not present

## 2019-09-04 DIAGNOSIS — F17210 Nicotine dependence, cigarettes, uncomplicated: Secondary | ICD-10-CM | POA: Diagnosis not present

## 2019-09-04 DIAGNOSIS — I4891 Unspecified atrial fibrillation: Secondary | ICD-10-CM | POA: Diagnosis not present

## 2019-09-04 DIAGNOSIS — Z5189 Encounter for other specified aftercare: Secondary | ICD-10-CM | POA: Diagnosis not present

## 2019-09-04 DIAGNOSIS — C3432 Malignant neoplasm of lower lobe, left bronchus or lung: Secondary | ICD-10-CM | POA: Diagnosis not present

## 2019-09-04 DIAGNOSIS — J910 Malignant pleural effusion: Secondary | ICD-10-CM

## 2019-09-04 LAB — BASIC METABOLIC PANEL
Anion gap: 10 (ref 5–15)
BUN: 17 mg/dL (ref 8–23)
CO2: 25 mmol/L (ref 22–32)
Calcium: 8.6 mg/dL — ABNORMAL LOW (ref 8.9–10.3)
Chloride: 91 mmol/L — ABNORMAL LOW (ref 98–111)
Creatinine, Ser: 0.63 mg/dL (ref 0.61–1.24)
GFR calc Af Amer: >60 mL/min (ref >60)
GFR calc non Af Amer: >60 mL/min (ref >60)
Glucose, Bld: 156 mg/dL — ABNORMAL HIGH (ref 70–99)
Potassium: 4.3 mmol/L (ref 3.5–5.1)
Sodium: 126 mmol/L — ABNORMAL LOW (ref 135–145)

## 2019-09-04 MED ORDER — FUROSEMIDE 20 MG PO TABS
20.0000 mg | ORAL_TABLET | Freq: Every day | ORAL | 1 refills | Status: DC
Start: 2019-09-04 — End: 2020-06-03

## 2019-09-04 MED ORDER — SODIUM CHLORIDE 0.9 % IV SOLN
140.0000 mg | Freq: Once | INTRAVENOUS | Status: AC
  Administered 2019-09-04: 140 mg via INTRAVENOUS
  Filled 2019-09-04: qty 7

## 2019-09-04 MED ORDER — SODIUM CHLORIDE 0.9 % IV SOLN
10.0000 mg | Freq: Once | INTRAVENOUS | Status: AC
  Administered 2019-09-04: 10 mg via INTRAVENOUS
  Filled 2019-09-04: qty 1

## 2019-09-04 MED ORDER — SODIUM CHLORIDE 0.9 % IV SOLN
Freq: Once | INTRAVENOUS | Status: AC
  Filled 2019-09-04: qty 250

## 2019-09-04 MED ORDER — SODIUM CHLORIDE 1 G PO TABS: 1.0000 g | ORAL_TABLET | Freq: Three times a day (TID) | ORAL | 0 refills | Status: DC

## 2019-09-04 MED ORDER — HEPARIN SOD (PORK) LOCK FLUSH 100 UNIT/ML IV SOLN
500.0000 [IU] | Freq: Once | INTRAVENOUS | Status: AC | PRN
  Administered 2019-09-04: 500 [IU]
  Filled 2019-09-04: qty 5

## 2019-09-04 NOTE — Progress Notes (Signed)
  Oncology Nurse Navigator Documentation  Navigator Location: [facility]Med Onc (09/03/19 1500)   )Navigator Encounter Type: Treatment (09/03/19 1500)                   Treatment Initiated Date: 09/02/19 (09/03/19 1500) Patient Visit Type: MedOnc (09/03/19 1500) Treatment Phase: Treatment (09/03/19 1500) Barriers/Navigation Needs: No Barriers At This Time (09/03/19 1500)   Interventions: None Required (09/03/19 1500)           Contacted pt after receiving first day of chemo treatment to assess for any needs or barriers. At this time, pt does not voice any needs or concerns. No barriers identified. Pt instructed to call if has any needs or questions in the future. Pt verbalized understanding. Nothing further needed at this time.           Time Spent with Patient: 30 (09/03/19 1500)

## 2019-09-04 NOTE — Progress Notes (Signed)
Patient was called for pre assessment. Denies any pain or concerns at this time.

## 2019-09-05 ENCOUNTER — Inpatient Hospital Stay: Payer: Medicare Other

## 2019-09-05 ENCOUNTER — Encounter: Payer: Self-pay | Admitting: Hematology and Oncology

## 2019-09-05 ENCOUNTER — Inpatient Hospital Stay (HOSPITAL_BASED_OUTPATIENT_CLINIC_OR_DEPARTMENT_OTHER): Payer: Medicare Other | Admitting: Hematology and Oncology

## 2019-09-05 VITALS — BP 111/73 | HR 88 | Temp 96.7°F | Resp 16 | Wt 148.1 lb

## 2019-09-05 DIAGNOSIS — J90 Pleural effusion, not elsewhere classified: Secondary | ICD-10-CM | POA: Diagnosis not present

## 2019-09-05 DIAGNOSIS — Z5112 Encounter for antineoplastic immunotherapy: Secondary | ICD-10-CM | POA: Diagnosis not present

## 2019-09-05 DIAGNOSIS — C3490 Malignant neoplasm of unspecified part of unspecified bronchus or lung: Secondary | ICD-10-CM

## 2019-09-05 DIAGNOSIS — E871 Hypo-osmolality and hyponatremia: Secondary | ICD-10-CM | POA: Diagnosis not present

## 2019-09-05 DIAGNOSIS — I4891 Unspecified atrial fibrillation: Secondary | ICD-10-CM | POA: Diagnosis not present

## 2019-09-05 DIAGNOSIS — G893 Neoplasm related pain (acute) (chronic): Secondary | ICD-10-CM | POA: Insufficient documentation

## 2019-09-05 DIAGNOSIS — Z5189 Encounter for other specified aftercare: Secondary | ICD-10-CM | POA: Diagnosis not present

## 2019-09-05 DIAGNOSIS — Z7901 Long term (current) use of anticoagulants: Secondary | ICD-10-CM | POA: Diagnosis not present

## 2019-09-05 DIAGNOSIS — C7951 Secondary malignant neoplasm of bone: Secondary | ICD-10-CM | POA: Diagnosis not present

## 2019-09-05 DIAGNOSIS — C3432 Malignant neoplasm of lower lobe, left bronchus or lung: Secondary | ICD-10-CM | POA: Diagnosis not present

## 2019-09-05 DIAGNOSIS — I509 Heart failure, unspecified: Secondary | ICD-10-CM | POA: Diagnosis not present

## 2019-09-05 DIAGNOSIS — F17210 Nicotine dependence, cigarettes, uncomplicated: Secondary | ICD-10-CM | POA: Diagnosis not present

## 2019-09-05 DIAGNOSIS — J189 Pneumonia, unspecified organism: Secondary | ICD-10-CM | POA: Diagnosis not present

## 2019-09-05 DIAGNOSIS — Z5111 Encounter for antineoplastic chemotherapy: Secondary | ICD-10-CM | POA: Diagnosis not present

## 2019-09-05 DIAGNOSIS — Z79899 Other long term (current) drug therapy: Secondary | ICD-10-CM | POA: Diagnosis not present

## 2019-09-05 DIAGNOSIS — I110 Hypertensive heart disease with heart failure: Secondary | ICD-10-CM | POA: Diagnosis not present

## 2019-09-05 DIAGNOSIS — Z7189 Other specified counseling: Secondary | ICD-10-CM

## 2019-09-05 LAB — BASIC METABOLIC PANEL
Anion gap: 8 (ref 5–15)
BUN: 20 mg/dL (ref 8–23)
CO2: 29 mmol/L (ref 22–32)
Calcium: 8.4 mg/dL — ABNORMAL LOW (ref 8.9–10.3)
Chloride: 90 mmol/L — ABNORMAL LOW (ref 98–111)
Creatinine, Ser: 0.67 mg/dL (ref 0.61–1.24)
GFR calc Af Amer: >60 mL/min (ref >60)
GFR calc non Af Amer: >60 mL/min (ref >60)
Glucose, Bld: 170 mg/dL — ABNORMAL HIGH (ref 70–99)
Potassium: 4 mmol/L (ref 3.5–5.1)
Sodium: 127 mmol/L — ABNORMAL LOW (ref 135–145)

## 2019-09-05 MED ORDER — PEGFILGRASTIM-CBQV 6 MG/0.6ML ~~LOC~~ SOSY
6.0000 mg | PREFILLED_SYRINGE | Freq: Once | SUBCUTANEOUS | Status: AC
  Administered 2019-09-05: 6 mg via SUBCUTANEOUS

## 2019-09-06 ENCOUNTER — Encounter: Payer: Medicare Other | Admitting: Cardiothoracic Surgery

## 2019-09-10 ENCOUNTER — Ambulatory Visit (INDEPENDENT_AMBULATORY_CARE_PROVIDER_SITE_OTHER): Payer: Self-pay

## 2019-09-10 ENCOUNTER — Encounter: Payer: Self-pay | Admitting: Hematology and Oncology

## 2019-09-10 ENCOUNTER — Other Ambulatory Visit: Payer: Self-pay

## 2019-09-10 DIAGNOSIS — Z09 Encounter for follow-up examination after completed treatment for conditions other than malignant neoplasm: Secondary | ICD-10-CM

## 2019-09-10 NOTE — Progress Notes (Signed)
[facility]  [address] [place], [place] [address] Phone: [phone]  Fax: [phone]   Clinic Day:  09/11/2019  Referring physician: No ref. provider found  Chief Complaint: Scott Jordan is a 69 y.o. male with extensive stage small cell lung cancer who is seen for nadir assessment on day 10 of cycle #1 carboplatin, etoposide, and Tecentriq.   HPI: The patient was last seen in the medical oncology clinic on 09/05/2019. At that time, he felt "fine".  He was sleeping more.  He denied any nausea or vomiting.  He felt anxious. He had been using Magic wash TID 2 x 2 days; thrush had significantly improved. He continued to feel short of breath upon exertion.  He continued salt tablets (1 gm TID) for SIADH.  He had a prescription for Lasix if he noted decreased urine output or lower extremity edema.  He received Udencya.  Symptomatically, he feels 'alright' today. His cough is still persistent; he has been coughing up white phlegm. He denies any bruising and bleeding. He drinks one Ensure a day. He drinks one and a half water bottles a day. He has also been drinking orange juice. He has had diarrhea over the last couple of days; diarrhea is improving.  It has been once a day.  He has been urinating a lot. He has not needed to take the Lasix pills. He notes he had weird dreams after his [place]. He weighs himself everyday at home; before he came here today it was 140 lb. He says his day mostly consists of laying on the couch and watching TV.   Past Medical History:  Diagnosis Date  . A-fib ([place]) 01/10/2019   pt st this was dx by Dr. Ubaldo Glassing  . Cancer ([place]) 2021   LUNG  . Complication of anesthesia    DIFFICULTY WAKING UP AFTER SURGERY- 20 YRS AGO  . Hypertension   . Substance abuse [facility])     Past Surgical History:  Procedure Laterality Date  . ARM DEBRIDEMENT Left    INCISION AND DEBRIDEMENT LOWER ARM -20 YRS AGO  . BACK SURGERY    . PORTACATH PLACEMENT  Left 08/29/2019   Procedure: INSERTION PORT-A-CATH;  Surgeon: Nestor Lewandowsky, MD;  Location: [facility] ORS;  Service: General;  Laterality: Left;    History reviewed. No pertinent family history.  Social History:  reports that he has been smoking cigarettes. He has been smoking about 0.50 packs per day. He has never used smokeless tobacco. He reports current alcohol use. He reports current drug use. Drug: Heroin. The patient denies any exposure to radiation or toxins. The patient lives in [place]. He has family friends staying with him to help care for his needs.His niece , [name] [phone]), is his medical power of attorney. The patient is alone  today.  Allergies: No Known Allergies  Current Medications: Current Outpatient Medications  Medication Sig Dispense Refill  . diltiazem (CARDIZEM CD) 240 MG 24 hr capsule Take 1 capsule (240 mg total) by mouth daily. 30 capsule 2  . ELIQUIS 5 MG TABS tablet Take 5 mg by mouth 2 (two) times daily.    Marland Kitchen lidocaine-prilocaine (EMLA) cream Apply to affected area once 30 g 3  . nystatin (MYCOSTATIN) 100000 UNIT/ML suspension Use as directed 5 mLs (500,000 Units total) in the mouth or throat 4 (four) times daily. Swish and spit 60 mL 0  . sodium chloride 1 g tablet Take 1 tablet (1 g total) by mouth  3 (three) times daily with meals. 30 tablet 0  . escitalopram (LEXAPRO) 10 MG tablet Take 1 tablet (10 mg total) by mouth daily. (Patient not taking: Reported on 09/11/2019) 90 tablet 0  . feeding supplement, ENSURE ENLIVE, (ENSURE ENLIVE) LIQD Take 237 mLs by mouth 3 (three) times daily between meals. (Patient not taking: Reported on 09/11/2019) 237 mL 12  . furosemide (LASIX) 20 MG tablet Take 1 tablet (20 mg total) by mouth daily. (Patient not taking: Reported on 09/11/2019) 20 tablet 1  . HYDROcodone-acetaminophen (NORCO/VICODIN) 5-325 MG tablet Take 1 tablet by mouth every 6 (six) hours as needed for moderate pain. (Patient not taking: Reported on 09/11/2019) 30  tablet 0  . losartan (COZAAR) 25 MG tablet Take 1 tablet (25 mg total) by mouth daily. (Patient not taking: Reported on 09/11/2019) 30 tablet 2  . ondansetron (ZOFRAN) 8 MG tablet Take 1 tablet (8 mg total) by mouth every 8 (eight) hours as needed for refractory nausea / vomiting. Start on day 3 after carboplatin chemo. (Patient not taking: Reported on 09/11/2019) 30 tablet 1   No current facility-administered medications for this visit.   Facility-Administered Medications Ordered in Other Visits  Medication Dose Route Frequency Provider Last Rate Last Admin  . denosumab (XGEVA) injection 120 mg  120 mg Subcutaneous Once Lequita Asal, MD        Review of Systems  Constitutional: Positive for malaise/fatigue. Negative for chills, diaphoresis, fever and weight loss.       Feels "alright".  HENT: Negative for congestion, ear pain, nosebleeds, sinus pain and sore throat.   Eyes: Negative for blurred vision and double vision.  Respiratory: Positive for sputum production (thick white phlegm) and shortness of breath (chronic).   Cardiovascular: Negative.  Negative for chest pain, palpitations and leg swelling (left ankle; improving).  Gastrointestinal: Positive for diarrhea (once a day over the last few days). Negative for abdominal pain, blood in stool, constipation, heartburn, nausea and vomiting. Melena: dark stool.       Drinking 1 Ensure per day.   Genitourinary: Negative for dysuria, frequency and urgency.  Musculoskeletal: Negative.  Negative for back pain, falls and myalgias.  Skin: Negative.  Negative for itching and rash.  Neurological: Negative for dizziness, sensory change, focal weakness, weakness and headaches.  Endo/Heme/Allergies: Negative.  Does not bruise/bleed easily.  Psychiatric/Behavioral: Negative for depression and memory loss. The patient is not nervous/anxious and does not have insomnia.    Performance status (ECOG): 2 - Symptomatic, <50% confined to bed  Vitals  Blood pressure 111/76, pulse 88, resp. rate 16, weight 104 lb 1.6 oz (47.2 kg), SpO2 98 %.   Physical Exam  Constitutional: He is oriented to person, place, and time. He appears well-developed and well-nourished. No distress. Face mask in place.  Thin, chronically fatigued appearing gentleman sitting comfortably in wheelchair in no acute distress.   HENT:  Head: Normocephalic and atraumatic.  Left Ear: Hearing normal.  Mouth/Throat: Oropharynx is clear and moist and mucous membranes are normal. No oral lesions.  Cap.  Brown hair.  Graying goatee.   Eyes: Pupils are equal, round, and reactive to light. Conjunctivae and EOM are normal. Right eye exhibits no discharge. Left eye exhibits no discharge. No scleral icterus.  Blue eyes.   Neck: No JVD present.  Cardiovascular: Normal rate, regular rhythm, normal heart sounds and intact distal pulses. Exam reveals no gallop and no friction rub.  No murmur heard. Pulmonary/Chest: Effort normal and breath sounds normal. No respiratory  distress. He has no wheezes. He has no rhonchi. He has no rales. He exhibits no tenderness.  Abdominal: Soft. Normal appearance and bowel sounds are normal. He exhibits no distension and no mass. There is no hepatosplenomegaly. There is no abdominal tenderness. There is no rebound and no guarding.  Musculoskeletal:        General: No tenderness or edema. Normal range of motion.     Cervical back: Normal range of motion and neck supple.  Lymphadenopathy:       Head (right side): No preauricular, no posterior auricular and no occipital adenopathy present.       Head (left side): No preauricular, no posterior auricular and no occipital adenopathy present.    He has no cervical adenopathy.    He has no axillary adenopathy.       Right: No inguinal and no supraclavicular adenopathy present.       Left: No inguinal and no supraclavicular adenopathy present.  Neurological: He is alert and oriented to person, place, and time.   Skin: Skin is warm, dry and intact. No bruising, no lesion and no rash noted. He is not diaphoretic. No erythema. No pallor.  Psychiatric: He has a normal mood and affect. His behavior is normal. Judgment and thought content normal.  Nursing note and vitals reviewed.   Appointment on 09/11/2019  Component Date Value Ref Range Status  . Sodium 09/11/2019 127* 135 - 145 mmol/L Final  . Potassium 09/11/2019 3.9  3.5 - 5.1 mmol/L Final  . Chloride 09/11/2019 93* 98 - 111 mmol/L Final  . CO2 09/11/2019 25  22 - 32 mmol/L Final  . Glucose, Bld 09/11/2019 105* 70 - 99 mg/dL Final   Glucose reference range applies only to samples taken after fasting for at least 8 hours.  . BUN 09/11/2019 11  8 - 23 mg/dL Final  . Creatinine, Ser 09/11/2019 0.60* 0.61 - 1.24 mg/dL Final  . Calcium 09/11/2019 8.6* 8.9 - 10.3 mg/dL Final  . GFR calc non Af Amer 09/11/2019 >60  >60 mL/min Final  . GFR calc Af Amer 09/11/2019 >60  >60 mL/min Final  . Anion gap 09/11/2019 9  5 - 15 Final   Performed at [facility], [address], [place], [place] [address]  . WBC 09/11/2019 0.8* 4.0 - 10.5 K/uL Final   Comment: This critical result has verified and been called to Orlene Och by Memory Argue on 06 02 2021 at 1542, and has been read back.  This critical result has verified and been called to Orlene Och by Memory Argue on 06 02 2021 at 1550, and has been read back.    Marland Kitchen RBC 09/11/2019 3.65* 4.22 - 5.81 MIL/uL Final  . Hemoglobin 09/11/2019 12.4* 13.0 - 17.0 g/dL Final  . HCT 09/11/2019 34.3* 39.0 - 52.0 % Final  . MCV 09/11/2019 94.0  80.0 - 100.0 fL Final  . MCH 09/11/2019 34.0  26.0 - 34.0 pg Final  . MCHC 09/11/2019 36.2* 30.0 - 36.0 g/dL Final  . RDW 09/11/2019 11.9  11.5 - 15.5 % Final  . Platelets 09/11/2019 77* 150 - 400 K/uL Final   Comment: Immature Platelet Fraction may be clinically indicated, consider ordering this additional test [id number]   . nRBC 09/11/2019 0.0  0.0 - 0.2 %  Final   Performed at [facility], [address], [place], [place] [address]  . Neutrophils Relative % 09/11/2019 PENDING  % Incomplete  . Neutro Abs 09/11/2019 PENDING  1.7 - 7.7 K/uL Incomplete  . Band Neutrophils 09/11/2019 PENDING  % Incomplete  . Lymphocytes Relative 09/11/2019 PENDING  % Incomplete  . Lymphs Abs 09/11/2019 PENDING  0.7 - 4.0 K/uL Incomplete  . Monocytes Relative 09/11/2019 PENDING  % Incomplete  . Monocytes Absolute 09/11/2019 PENDING  0.1 - 1.0 K/uL Incomplete  . Eosinophils Relative 09/11/2019 PENDING  % Incomplete  . Eosinophils Absolute 09/11/2019 PENDING  0.0 - 0.5 K/uL Incomplete  . Basophils Relative 09/11/2019 PENDING  % Incomplete  . Basophils Absolute 09/11/2019 PENDING  0.0 - 0.1 K/uL Incomplete  . WBC Morphology 09/11/2019 PENDING   Incomplete  . RBC Morphology 09/11/2019 PENDING   Incomplete  . Smear Review 09/11/2019 PENDING   Incomplete  . Other 09/11/2019 PENDING  % Incomplete  . nRBC 09/11/2019 PENDING  0 /100 WBC Incomplete  . Metamyelocytes Relative 09/11/2019 PENDING  % Incomplete  . Myelocytes 09/11/2019 PENDING  % Incomplete  . Promyelocytes Relative 09/11/2019 PENDING  % Incomplete  . Blasts 09/11/2019 PENDING  % Incomplete  . Immature Granulocytes 09/11/2019 PENDING  % Incomplete  . Abs Immature Granulocytes 09/11/2019 PENDING  0.00 - 0.07 K/uL Incomplete    Assessment:  RITO LECOMTE is a 69 y.o. male with extensive stage small cell lung cancer. He has a >40 pack year smoking. He presented with a recurrent left sided pleural effusion. He has undergone thoracentesis x 2 in the past month. He is s/p pigtail drain. Initial cytology x 2 was negative (lymphocytic exudative effusion).Pleural fluid cytologyon 08/18/2019 confirmed small cell lung cancer.  Chest CT without contraston 08/19/2019 revealed the left pleural pigtail drain. There was a small residual left hydropneumothorax. There was a 6.7 x 4 cm medial basilar  left lower lobe lung masssuspicious for primary bronchogenic carcinoma. There was a lingular 1.0 cm solid pulmonary nodulesuspicious for ipsilateral metastasis. There was intralobular septal thickening and patchy groundglass opacity throughout the lingula and the left lower lobe with moderate residual atelectasis of the left lung base. A component of lymphangitic carcinomatosis was suspected. There was ipsilateral infra hilar, subcarinal, AP window, prevascular and upper retroperitoneal adenopathyc/wmetastatic disease.  Head MRI on 08/21/2019 revealed no evidence of metastatic disease.  PET scan on 05/20/20201 revealed markedly hypermetabolic left lower lobe pulmonary lesion (SUV 7.9) c/w the patient's known malignancy. There was associated hypermetabolic nodal metastases in the mediastinum and left hilum with metastatic involvement of left pleural space. Additional sites included upper abdominal hypermetabolic lymphadenopathy and scattered hypermetabolic bone metastases (left acetabulum, sacrum, and L5). There was interval decrease in left pleural effusion with areas of loculation.  He is day 10 of cycle #1 carboplatin, etoposide, and Tecentriq (09/02/2019) with Udencya support  He has hyponatremiasecondary to SIADH.  He began sodium chloride tablets (1 gm TID) on 09/04/2019.  He has a significant family history of malignancy. A family member has had genetic testing (negative for Lynch syndrome).  Symptomatically, he feels "alright".  Exam is stable.  WBC is 800 (ANC 100).  Platelet count is 77,000.  Sodium is 127.  Plan: 1.   Labs today: CBC with diff, BMP. 2.   Metastatic small cell lung cancer Clinical stage T3N2M1 (stage IV). Pleural fluid cytology confirmed small cell lung cancer. Head MRIwas negative forCNS metastasis. He is currently day 10 s/p cycle #1 carboplatin, etoposide and Tecentriq.   He tolerated his chemotherapy  well.              Discuss myelosuppression despite Udencya support.  Review neutropenic precuations. 3.Left  pleural effusion             Patient is s/p thoracentesis x 2 followed by pig tail drainage in hospital.             Clinically, exam dose not reveal increase pleural fluid.  Continue to monitor. 4.   Hyponatremia Sodium 127 on salt tablets 1 gm po TID. Etiology felt secondary to SIADH.             Continue free water restriction and encourage fluids with electrolytes.             Follow-up with Dr Holley Raring (nephrology consultation). 5. Cancer-related pain             Pain appears well controlled with Lortab 5/325 prn. 6.   History of atrial fibrillation             Patient followed by Dr Ubaldo Glassing and is on Eliquis.             Platelet count has decreased from 310,000 to 77,000.  Ensure platelets > 50,000. 7. Leukopenia and thrombocytopenia  WBC 800 (ANC 100) and platelets 77,000.  Patient denies fever or bleeding.  Review neutropenic precautions.  Check CBC on 09/13/2019 (prior to weekend) to ensure recovery of counts on Eliquis.  Patient instructed to hold AM Eliquis on 09/13/2019 prior to lab draw to ensure platelet count has not dropped further.  Patient requires transportation assistance via [employer]. Please schedule. 8.   No Xgeva. 9.   Consult nephrology (Dr Holley Raring) from last week- he is unaware of appt time. 10.  RTC 09/13/2019 for brief MD visit and labs (CBC with diff, BMP, ferritin, iron studies, B12, folate, type and screen). 11.  RTC on 09/23/2019 for MD assessment, labs (CBC with diff, CMP, Mg, CEA), cycle #2 carboplatin, etoposide, and Tecentriq and +/- Xgeva. 12.   Patient requires [employer] Lei transportation.  I discussed the assessment and treatment plan with the patient.  The patient was provided an opportunity to ask questions and all were answered.  The patient agreed with the plan and demonstrated an understanding of the instructions.   The patient was advised to call back if the symptoms worsen or if the condition fails to improve as anticipated.  I provided 15 minutes (4:05 PM - 4:20 PM) of face-to-face time during this this encounter and > 50% was spent counseling as documented under my assessment and plan.    Lequita Asal, MD, PhD    09/11/2019, 4:05 PM  I, Heywood Footman, am acting as Education administrator for [employer]. Mike Gip, MD, PhD.  I, Melissa C. Mike Gip, MD, have reviewed the above documentation for accuracy and completeness, and I agree with the above.

## 2019-09-10 NOTE — Patient Instructions (Signed)
Wound Care, Adult Taking care of your wound properly can help to prevent pain, infection, and scarring. It can also help your wound to heal more quickly. How to care for your wound Wound care      Follow instructions from your health care provider about how to take care of your wound. Make sure you: ? Wash your hands with soap and water before you change the bandage (dressing). If soap and water are not available, use hand sanitizer. ? Change your dressing as told by your health care provider. ? Leave stitches (sutures), skin glue, or adhesive strips in place. These skin closures may need to stay in place for 2 weeks or longer. If adhesive strip edges start to loosen and curl up, you may trim the loose edges. Do not remove adhesive strips completely unless your health care provider tells you to do that.  Check your wound area every day for signs of infection. Check for: ? Redness, swelling, or pain. ? Fluid or blood. ? Warmth. ? Pus or a bad smell.  Ask your health care provider if you should clean the wound with mild soap and water. Doing this may include: ? Using a clean towel to pat the wound dry after cleaning it. Do not rub or scrub the wound. ? Applying a cream or ointment. Do this only as told by your health care provider. ? Covering the incision with a clean dressing.  Ask your health care provider when you can leave the wound uncovered.  Keep the dressing dry until your health care provider says it can be removed. Do not take baths, swim, use a hot tub, or do anything that would put the wound underwater until your health care provider approves. Ask your health care provider if you can take showers. You may only be allowed to take sponge baths. Medicines   If you were prescribed an antibiotic medicine, cream, or ointment, take or use the antibiotic as told by your health care provider. Do not stop taking or using the antibiotic even if your condition improves.  Take  over-the-counter and prescription medicines only as told by your health care provider. If you were prescribed pain medicine, take it 30 or more minutes before you do any wound care or as told by your health care provider. General instructions  Return to your normal activities as told by your health care provider. Ask your health care provider what activities are safe.  Do not scratch or pick at the wound.  Do not use any products that contain nicotine or tobacco, such as cigarettes and e-cigarettes. These may delay wound healing. If you need help quitting, ask your health care provider.  Keep all follow-up visits as told by your health care provider. This is important.  Eat a diet that includes protein, vitamin A, vitamin C, and other nutrient-rich foods to help the wound heal. ? Foods rich in protein include meat, dairy, beans, nuts, and other sources. ? Foods rich in vitamin A include carrots and dark green, leafy vegetables. ? Foods rich in vitamin C include citrus, tomatoes, and other fruits and vegetables. ? Nutrient-rich foods have protein, carbohydrates, fat, vitamins, or minerals. Eat a variety of healthy foods including vegetables, fruits, and whole grains. Contact a health care provider if:  You received a tetanus shot and you have swelling, severe pain, redness, or bleeding at the injection site.  Your pain is not controlled with medicine.  You have redness, swelling, or pain around the wound.    You have fluid or blood coming from the wound.  Your wound feels warm to the touch.  You have pus or a bad smell coming from the wound.  You have a fever or chills.  You are nauseous or you vomit.  You are dizzy. Get help right away if:  You have a red streak going away from your wound.  The edges of the wound open up and separate.  Your wound is bleeding, and the bleeding does not stop with gentle pressure.  You have a rash.  You faint.  You have trouble  breathing. Summary  Always wash your hands with soap and water before changing your bandage (dressing).  To help with healing, eat foods that are rich in protein, vitamin A, vitamin C, and other nutrients.  Check your wound every day for signs of infection. Contact your health care provider if you suspect that your wound is infected. This information is not intended to replace advice given to you by your health care provider. Make sure you discuss any questions you have with your health care provider. Document Revised: 07/16/2018 Document Reviewed: 10/13/2015 Elsevier Patient Education  2020 [employer]  

## 2019-09-11 ENCOUNTER — Inpatient Hospital Stay: Payer: Medicare Other

## 2019-09-11 ENCOUNTER — Other Ambulatory Visit: Payer: Self-pay

## 2019-09-11 ENCOUNTER — Inpatient Hospital Stay: Payer: Medicare Other | Attending: Hematology and Oncology | Admitting: Hematology and Oncology

## 2019-09-11 ENCOUNTER — Encounter: Payer: Self-pay | Admitting: Hematology and Oncology

## 2019-09-11 VITALS — BP 111/76 | HR 88 | Resp 16 | Wt 104.1 lb

## 2019-09-11 DIAGNOSIS — T451X5A Adverse effect of antineoplastic and immunosuppressive drugs, initial encounter: Secondary | ICD-10-CM | POA: Diagnosis not present

## 2019-09-11 DIAGNOSIS — G893 Neoplasm related pain (acute) (chronic): Secondary | ICD-10-CM | POA: Diagnosis not present

## 2019-09-11 DIAGNOSIS — R110 Nausea: Secondary | ICD-10-CM | POA: Diagnosis not present

## 2019-09-11 DIAGNOSIS — C781 Secondary malignant neoplasm of mediastinum: Secondary | ICD-10-CM | POA: Diagnosis not present

## 2019-09-11 DIAGNOSIS — E222 Syndrome of inappropriate secretion of antidiuretic hormone: Secondary | ICD-10-CM | POA: Diagnosis not present

## 2019-09-11 DIAGNOSIS — J910 Malignant pleural effusion: Secondary | ICD-10-CM

## 2019-09-11 DIAGNOSIS — Z79899 Other long term (current) drug therapy: Secondary | ICD-10-CM | POA: Insufficient documentation

## 2019-09-11 DIAGNOSIS — Z7901 Long term (current) use of anticoagulants: Secondary | ICD-10-CM | POA: Diagnosis not present

## 2019-09-11 DIAGNOSIS — E860 Dehydration: Secondary | ICD-10-CM | POA: Diagnosis not present

## 2019-09-11 DIAGNOSIS — E871 Hypo-osmolality and hyponatremia: Secondary | ICD-10-CM | POA: Diagnosis not present

## 2019-09-11 DIAGNOSIS — C7951 Secondary malignant neoplasm of bone: Secondary | ICD-10-CM

## 2019-09-11 DIAGNOSIS — I10 Essential (primary) hypertension: Secondary | ICD-10-CM | POA: Insufficient documentation

## 2019-09-11 DIAGNOSIS — F17210 Nicotine dependence, cigarettes, uncomplicated: Secondary | ICD-10-CM | POA: Insufficient documentation

## 2019-09-11 DIAGNOSIS — D649 Anemia, unspecified: Secondary | ICD-10-CM | POA: Diagnosis not present

## 2019-09-11 DIAGNOSIS — Z5189 Encounter for other specified aftercare: Secondary | ICD-10-CM | POA: Diagnosis not present

## 2019-09-11 DIAGNOSIS — Z5112 Encounter for antineoplastic immunotherapy: Secondary | ICD-10-CM | POA: Diagnosis not present

## 2019-09-11 DIAGNOSIS — D701 Agranulocytosis secondary to cancer chemotherapy: Secondary | ICD-10-CM

## 2019-09-11 DIAGNOSIS — I4891 Unspecified atrial fibrillation: Secondary | ICD-10-CM | POA: Insufficient documentation

## 2019-09-11 DIAGNOSIS — J90 Pleural effusion, not elsewhere classified: Secondary | ICD-10-CM | POA: Insufficient documentation

## 2019-09-11 DIAGNOSIS — C3490 Malignant neoplasm of unspecified part of unspecified bronchus or lung: Secondary | ICD-10-CM

## 2019-09-11 DIAGNOSIS — C3432 Malignant neoplasm of lower lobe, left bronchus or lung: Secondary | ICD-10-CM | POA: Insufficient documentation

## 2019-09-11 DIAGNOSIS — Z5111 Encounter for antineoplastic chemotherapy: Secondary | ICD-10-CM | POA: Diagnosis not present

## 2019-09-11 DIAGNOSIS — Z809 Family history of malignant neoplasm, unspecified: Secondary | ICD-10-CM | POA: Diagnosis not present

## 2019-09-11 DIAGNOSIS — D696 Thrombocytopenia, unspecified: Secondary | ICD-10-CM | POA: Insufficient documentation

## 2019-09-11 LAB — CBC WITH DIFFERENTIAL/PLATELET
Abs Immature Granulocytes: 0 10*3/uL (ref 0.00–0.07)
Basophils Absolute: 0 10*3/uL (ref 0.0–0.1)
Basophils Relative: 0 %
Eosinophils Absolute: 0 10*3/uL (ref 0.0–0.5)
Eosinophils Relative: 0 %
HCT: 34.3 % — ABNORMAL LOW (ref 39.0–52.0)
Hemoglobin: 12.4 g/dL — ABNORMAL LOW (ref 13.0–17.0)
Immature Granulocytes: 0 %
Lymphocytes Relative: 73 %
Lymphs Abs: 0.6 10*3/uL — ABNORMAL LOW (ref 0.7–4.0)
MCH: 34 pg (ref 26.0–34.0)
MCHC: 36.2 g/dL — ABNORMAL HIGH (ref 30.0–36.0)
MCV: 94 fL (ref 80.0–100.0)
Monocytes Absolute: 0.1 10*3/uL (ref 0.1–1.0)
Monocytes Relative: 14 %
Neutro Abs: 0.1 10*3/uL — ABNORMAL LOW (ref 1.7–7.7)
Neutrophils Relative %: 13 %
Platelets: 77 10*3/uL — ABNORMAL LOW (ref 150–400)
RBC: 3.65 MIL/uL — ABNORMAL LOW (ref 4.22–5.81)
RDW: 11.9 % (ref 11.5–15.5)
WBC: 0.8 10*3/uL — CL (ref 4.0–10.5)
nRBC: 0 % (ref 0.0–0.2)

## 2019-09-11 LAB — BASIC METABOLIC PANEL
Anion gap: 9 (ref 5–15)
BUN: 11 mg/dL (ref 8–23)
CO2: 25 mmol/L (ref 22–32)
Calcium: 8.6 mg/dL — ABNORMAL LOW (ref 8.9–10.3)
Chloride: 93 mmol/L — ABNORMAL LOW (ref 98–111)
Creatinine, Ser: 0.6 mg/dL — ABNORMAL LOW (ref 0.61–1.24)
GFR calc Af Amer: >60 mL/min (ref >60)
GFR calc non Af Amer: >60 mL/min (ref >60)
Glucose, Bld: 105 mg/dL — ABNORMAL HIGH (ref 70–99)
Potassium: 3.9 mmol/L (ref 3.5–5.1)
Sodium: 127 mmol/L — ABNORMAL LOW (ref 135–145)

## 2019-09-11 MED ORDER — DENOSUMAB 120 MG/1.7ML ~~LOC~~ SOLN: 120.0000 mg | Freq: Once | SUBCUTANEOUS | Status: DC

## 2019-09-11 NOTE — Patient Instructions (Signed)
  Neutropenic precautions

## 2019-09-11 NOTE — Progress Notes (Signed)
Patient here for oncology follow-up appointment, complaints of fatigue, cough, and shortness of breath.

## 2019-09-13 ENCOUNTER — Inpatient Hospital Stay: Payer: Medicare Other

## 2019-09-14 ENCOUNTER — Other Ambulatory Visit: Payer: Self-pay | Admitting: Hematology and Oncology

## 2019-09-14 DIAGNOSIS — D696 Thrombocytopenia, unspecified: Secondary | ICD-10-CM

## 2019-09-15 ENCOUNTER — Telehealth: Payer: Self-pay | Admitting: Hematology and Oncology

## 2019-09-15 NOTE — Telephone Encounter (Signed)
Re:  Blood counts  I spoke to the patient yesterday on 2 separate occasions as well as today.  He was to have a CBC drawn on 09/13/2019 to assess his falling platelet count.  Eliquis was held on Friday morning prior to a CBC being drawn.  He did not make his appointment.    He was contacted on 09/14/2019 when it was realized that he did not make his appointment.  New orders were placed and a call made to the outpatient lab for a lab draw in the medical mall on Saturday, 09/14/2019.  As he came after 3 PM, registration was closed.  Apparently staff did not know what to do as registration was closed.  He went home without labs beibng drawn.  I called and contacted the patient today after speaking with the lab.  After 3 PM on the weekend, patients go to the ER to be banded prior to lab draw.  Patient was offered the ability to come today to have his lab drawn.  He remains off Eliquis.  If his platelet count has recovered, he should be on Eliquis.  He is on Eliquis because of atrial fibrillation to prevent stroke.  Patient stated that he did not want to come today.  He is aware of the risks.  He will have his blood drawn tomorrow.   Lequita Asal, MD

## 2019-09-16 ENCOUNTER — Other Ambulatory Visit: Payer: Self-pay

## 2019-09-16 ENCOUNTER — Other Ambulatory Visit: Payer: Self-pay | Admitting: Hematology and Oncology

## 2019-09-16 ENCOUNTER — Inpatient Hospital Stay: Payer: Medicare Other

## 2019-09-16 ENCOUNTER — Telehealth: Payer: Self-pay | Admitting: Hematology and Oncology

## 2019-09-16 DIAGNOSIS — E860 Dehydration: Secondary | ICD-10-CM | POA: Diagnosis not present

## 2019-09-16 DIAGNOSIS — J90 Pleural effusion, not elsewhere classified: Secondary | ICD-10-CM | POA: Diagnosis not present

## 2019-09-16 DIAGNOSIS — T451X5A Adverse effect of antineoplastic and immunosuppressive drugs, initial encounter: Secondary | ICD-10-CM | POA: Diagnosis not present

## 2019-09-16 DIAGNOSIS — D696 Thrombocytopenia, unspecified: Secondary | ICD-10-CM

## 2019-09-16 DIAGNOSIS — Z5112 Encounter for antineoplastic immunotherapy: Secondary | ICD-10-CM | POA: Diagnosis not present

## 2019-09-16 DIAGNOSIS — C3490 Malignant neoplasm of unspecified part of unspecified bronchus or lung: Secondary | ICD-10-CM

## 2019-09-16 DIAGNOSIS — J910 Malignant pleural effusion: Secondary | ICD-10-CM

## 2019-09-16 DIAGNOSIS — Z809 Family history of malignant neoplasm, unspecified: Secondary | ICD-10-CM | POA: Diagnosis not present

## 2019-09-16 DIAGNOSIS — R110 Nausea: Secondary | ICD-10-CM | POA: Diagnosis not present

## 2019-09-16 DIAGNOSIS — G893 Neoplasm related pain (acute) (chronic): Secondary | ICD-10-CM | POA: Diagnosis not present

## 2019-09-16 DIAGNOSIS — C7951 Secondary malignant neoplasm of bone: Secondary | ICD-10-CM | POA: Diagnosis not present

## 2019-09-16 DIAGNOSIS — Z79899 Other long term (current) drug therapy: Secondary | ICD-10-CM | POA: Diagnosis not present

## 2019-09-16 DIAGNOSIS — C3432 Malignant neoplasm of lower lobe, left bronchus or lung: Secondary | ICD-10-CM | POA: Diagnosis not present

## 2019-09-16 DIAGNOSIS — I10 Essential (primary) hypertension: Secondary | ICD-10-CM | POA: Diagnosis not present

## 2019-09-16 DIAGNOSIS — Z7901 Long term (current) use of anticoagulants: Secondary | ICD-10-CM | POA: Diagnosis not present

## 2019-09-16 DIAGNOSIS — Z5189 Encounter for other specified aftercare: Secondary | ICD-10-CM | POA: Diagnosis not present

## 2019-09-16 DIAGNOSIS — E222 Syndrome of inappropriate secretion of antidiuretic hormone: Secondary | ICD-10-CM | POA: Diagnosis not present

## 2019-09-16 DIAGNOSIS — Z5111 Encounter for antineoplastic chemotherapy: Secondary | ICD-10-CM | POA: Diagnosis not present

## 2019-09-16 DIAGNOSIS — C781 Secondary malignant neoplasm of mediastinum: Secondary | ICD-10-CM | POA: Diagnosis not present

## 2019-09-16 DIAGNOSIS — D649 Anemia, unspecified: Secondary | ICD-10-CM

## 2019-09-16 DIAGNOSIS — E871 Hypo-osmolality and hyponatremia: Secondary | ICD-10-CM

## 2019-09-16 DIAGNOSIS — I4891 Unspecified atrial fibrillation: Secondary | ICD-10-CM | POA: Diagnosis not present

## 2019-09-16 DIAGNOSIS — D701 Agranulocytosis secondary to cancer chemotherapy: Secondary | ICD-10-CM | POA: Diagnosis not present

## 2019-09-16 LAB — CBC WITH DIFFERENTIAL/PLATELET
Abs Immature Granulocytes: 3.56 10*3/uL — ABNORMAL HIGH (ref 0.00–0.07)
Basophils Absolute: 0 10*3/uL (ref 0.0–0.1)
Basophils Relative: 0 %
Eosinophils Absolute: 0 10*3/uL (ref 0.0–0.5)
Eosinophils Relative: 0 %
HCT: 31.9 % — ABNORMAL LOW (ref 39.0–52.0)
Hemoglobin: 11.4 g/dL — ABNORMAL LOW (ref 13.0–17.0)
Immature Granulocytes: 22 %
Lymphocytes Relative: 11 %
Lymphs Abs: 1.7 10*3/uL (ref 0.7–4.0)
MCH: 33.9 pg (ref 26.0–34.0)
MCHC: 35.7 g/dL (ref 30.0–36.0)
MCV: 94.9 fL (ref 80.0–100.0)
Monocytes Absolute: 1.9 10*3/uL — ABNORMAL HIGH (ref 0.1–1.0)
Monocytes Relative: 12 %
Neutro Abs: 9.1 10*3/uL — ABNORMAL HIGH (ref 1.7–7.7)
Neutrophils Relative %: 55 %
Platelets: 124 10*3/uL — ABNORMAL LOW (ref 150–400)
RBC: 3.36 MIL/uL — ABNORMAL LOW (ref 4.22–5.81)
RDW: 12.5 % (ref 11.5–15.5)
Smear Review: NORMAL
WBC: 16.3 10*3/uL — ABNORMAL HIGH (ref 4.0–10.5)
nRBC: 0 % (ref 0.0–0.2)

## 2019-09-16 LAB — BASIC METABOLIC PANEL
Anion gap: 7 (ref 5–15)
BUN: <5 mg/dL — ABNORMAL LOW (ref 8–23)
CO2: 27 mmol/L (ref 22–32)
Calcium: 8.4 mg/dL — ABNORMAL LOW (ref 8.9–10.3)
Chloride: 97 mmol/L — ABNORMAL LOW (ref 98–111)
Creatinine, Ser: 0.66 mg/dL (ref 0.61–1.24)
GFR calc Af Amer: >60 mL/min (ref >60)
GFR calc non Af Amer: >60 mL/min (ref >60)
Glucose, Bld: 102 mg/dL — ABNORMAL HIGH (ref 70–99)
Potassium: 3.5 mmol/L (ref 3.5–5.1)
Sodium: 131 mmol/L — ABNORMAL LOW (ref 135–145)

## 2019-09-16 LAB — IRON AND TIBC
Iron: 181 ug/dL (ref 45–182)
Saturation Ratios: 65 % — ABNORMAL HIGH (ref 17.9–39.5)
TIBC: 280 ug/dL (ref 250–450)
UIBC: 99 ug/dL

## 2019-09-16 LAB — ACID FAST CULTURE WITH REFLEXED SENSITIVITIES (MYCOBACTERIA): Acid Fast Culture: NEGATIVE

## 2019-09-16 LAB — TYPE AND SCREEN
ABO/RH(D): A POS
Antibody Screen: NEGATIVE

## 2019-09-16 LAB — FOLATE: Folate: 8.6 ng/mL (ref >5.9)

## 2019-09-16 LAB — FERRITIN: Ferritin: 910 ng/mL — ABNORMAL HIGH (ref 24–336)

## 2019-09-16 LAB — VITAMIN B12: Vitamin B-12: 673 pg/mL (ref 180–914)

## 2019-09-16 NOTE — Telephone Encounter (Signed)
Re:  Labs  Called patient about follow-up labs drawn today.  Platelet count has recovered.  He will restart Eliquis today.  WBC has improved.  He is no longer neutropenic.  Sodium was improved to 131 from 127.  Continue salt tablets.   Lequita Asal, MD

## 2019-09-17 ENCOUNTER — Other Ambulatory Visit: Payer: Medicare Other

## 2019-09-20 ENCOUNTER — Encounter: Payer: Medicare Other | Admitting: Cardiothoracic Surgery

## 2019-09-20 NOTE — Progress Notes (Signed)
[facility]  [address], [address] [place], [place] [address] Phone: [phone]  Fax: [phone]   Clinic Day:  09/23/2019  Referring physician: No ref. provider found  Chief Complaint: RHET RORKE is a 69 y.o. male with extensive stage small cell lung cancer who is seen for assessment prior to cycle #2 carboplatin, etoposide, and Tecentriq.   HPI: The patient was last seen in the medical oncology clinic on 09/11/2019. At that time, he felt "alright".  Exam was stable.  Hematocrit was 34.3, hemoglobin 12.4, platelets 77,000, WBC 800 (ANC 100). Sodium was 127. Creatinine was 0.60. Calcium was 8.6.   Eliquis was held in the AM on 09/13/2019 secondary to falling counts prior to that day's labs.  He did not returned for a follow-up CBC.  CBC on 09/16/2019 revealed a hematocrit of 31.9, hemoglobin 11.4, MCV 94.9, platelets 124,000, WBC 16,300 (ANC 9,100). Ferritin was 910 with an iron saturation of 65% and a TIBC of 280.  Vitamin B12 was 673 with folate 8.6.  Sodium was 131 and calcium 8.4.  Eliquis was restarted.   Symptomatically, he feels good. He was losing his hair, so he shaved his head. He denies any bone pain, bruising or bleeding. He feels dizzy upon standing. He has exertional shortness of breath. When walking or doing activities,  he take his time. He has a good energy level.   He scale at home reports a weight of 144 lbs. He has a good appetite. He is drinking 2 Ensure per day. His bowels are normal. His diarrhea has resolved. He feel nauseous when coughing up white phlegm. The swelling in his legs went down. He has no taken any Lasix.  He ran out of his sodium pills yesterday. He has not smoked in 6 weeks.  He is not taking any calcium supplements.    Past Medical History:  Diagnosis Date  . A-fib ([place]) 01/10/2019   pt st this was dx by Dr. Ubaldo Glassing  . Cancer ([place]) 2021   LUNG  . Complication of anesthesia    DIFFICULTY WAKING UP AFTER SURGERY- 20 YRS  AGO  . Hypertension   . Lung cancer ([place])   . Substance abuse [facility])     Past Surgical History:  Procedure Laterality Date  . ARM DEBRIDEMENT Left    INCISION AND DEBRIDEMENT LOWER ARM -20 YRS AGO  . BACK SURGERY    . PORTACATH PLACEMENT Left 08/29/2019   Procedure: INSERTION PORT-A-CATH;  Surgeon: Nestor Lewandowsky, MD;  Location: [facility] ORS;  Service: General;  Laterality: Left;    No family history on file.  Social History:  reports that he has quit smoking. His smoking use included cigarettes. He smoked 0.50 packs per day. He has never used smokeless tobacco. He reports current alcohol use. He reports previous drug use. Drug: Heroin. The patient denies any exposure to radiation or toxins. The patient lives in [place]. He has family friends staying with him to help care for his needs.His niece , [name] [phone]), is his medical power of attorney. The patient is alone today.  Allergies: No Known Allergies  Current Medications: Current Outpatient Medications  Medication Sig Dispense Refill  . diltiazem (CARDIZEM CD) 240 MG 24 hr capsule Take 1 capsule (240 mg total) by mouth daily. 30 capsule 2  . ELIQUIS 5 MG TABS tablet Take 5 mg by mouth 2 (two) times daily.    Marland Kitchen escitalopram (LEXAPRO) 10 MG tablet Take 1 tablet (10 mg total) by  mouth daily. 90 tablet 0  . feeding supplement, ENSURE ENLIVE, (ENSURE ENLIVE) LIQD Take 237 mLs by mouth 3 (three) times daily between meals. 237 mL 12  . lidocaine-prilocaine (EMLA) cream Apply to affected area once 30 g 3  . losartan (COZAAR) 25 MG tablet Take 1 tablet (25 mg total) by mouth daily. 30 tablet 2  . ondansetron (ZOFRAN) 8 MG tablet Take 1 tablet (8 mg total) by mouth every 8 (eight) hours as needed for refractory nausea / vomiting. Start on day 3 after carboplatin chemo. 30 tablet 1  . furosemide (LASIX) 20 MG tablet Take 1 tablet (20 mg total) by mouth daily. (Patient not taking: Reported on 09/11/2019) 20 tablet 1  .  HYDROcodone-acetaminophen (NORCO/VICODIN) 5-325 MG tablet Take 1 tablet by mouth every 6 (six) hours as needed for moderate pain. (Patient not taking: Reported on 09/11/2019) 30 tablet 0  . nystatin (MYCOSTATIN) 100000 UNIT/ML suspension Use as directed 5 mLs (500,000 Units total) in the mouth or throat 4 (four) times daily. Swish and spit (Patient not taking: Reported on 09/23/2019) 60 mL 0  . sodium chloride 1 g tablet Take 1 tablet (1 g total) by mouth 3 (three) times daily with meals. (Patient not taking: Reported on 09/23/2019) 30 tablet 0   No current facility-administered medications for this visit.   Facility-Administered Medications Ordered in Other Visits  Medication Dose Route Frequency Provider Last Rate Last Admin  . denosumab (XGEVA) injection 120 mg  120 mg Subcutaneous Once Lequita Asal, MD        Review of Systems  Constitutional: Negative for chills, diaphoresis, fever, malaise/fatigue and weight loss (weighs 144 lbs).       Feels good. Good energy.  HENT: Negative for congestion, ear pain, nosebleeds, sinus pain and sore throat.   Eyes: Negative for blurred vision and double vision.  Respiratory: Positive for sputum production (thick white phlegm) and shortness of breath (chronic).   Cardiovascular: Negative.  Negative for chest pain, palpitations and leg swelling (left ankle; improving).  Gastrointestinal: Positive for nausea. Negative for abdominal pain, blood in stool, constipation, diarrhea (resolved), heartburn and vomiting. Melena: dark stool.       Drinking 1 Ensure per day. Good appetite. Normal bowels.  Genitourinary: Negative for dysuria, frequency and urgency.  Musculoskeletal: Negative.  Negative for back pain, falls and myalgias.  Skin: Negative.  Negative for itching and rash.       Hair loss secondary to treatment.  Neurological: Negative for dizziness (upon standing), sensory change, focal weakness, weakness and headaches.  Endo/Heme/Allergies: Negative.   Does not bruise/bleed easily.  Psychiatric/Behavioral: Negative for depression and memory loss. The patient is not nervous/anxious and does not have insomnia.   All other systems reviewed and are negative.  Performance status (ECOG): 1-2  Vitals Blood pressure 108/70, pulse 74, temperature (!) 96 F (35.6 C), temperature source Tympanic, weight 134 lb 0.6 oz (60.8 kg), SpO2 97 %.   Physical Exam Vitals and nursing note reviewed.  Constitutional:      General: He is not in acute distress.    Appearance: Normal appearance. He is well-developed. He is not diaphoretic.     Interventions: Face mask in place.     Comments: Thin, chronically fatigued appearing gentleman sitting comfortably in wheelchair in no acute distress.   HENT:     Head: Normocephalic and atraumatic.     Comments: Shaved head. Mask. Wearing a scarf.    Left Ear: Hearing normal.  Mouth/Throat:     Mouth: No oral lesions.  Eyes:     General: No scleral icterus.       Right eye: No discharge.        Left eye: No discharge.     Conjunctiva/sclera: Conjunctivae normal.     Pupils: Pupils are equal, round, and reactive to light.     Comments: Blue eyes.   Neck:     Vascular: No JVD.  Cardiovascular:     Rate and Rhythm: Normal rate and regular rhythm.     Heart sounds: Normal heart sounds. No murmur heard.  No friction rub. No gallop.   Pulmonary:     Effort: Pulmonary effort is normal. No respiratory distress.     Breath sounds: No wheezing, rhonchi or rales.     Comments: Decreased breath sounds left base. Chest:     Chest wall: No tenderness.  Abdominal:     General: Bowel sounds are normal. There is no distension.     Palpations: Abdomen is soft. There is no mass.     Tenderness: There is no abdominal tenderness. There is no guarding or rebound.  Musculoskeletal:        General: No tenderness. Normal range of motion.     Cervical back: Normal range of motion and neck supple.  Lymphadenopathy:      Head:     Right side of head: No preauricular, posterior auricular or occipital adenopathy.     Left side of head: No preauricular, posterior auricular or occipital adenopathy.     Cervical: No cervical adenopathy.     Upper Body:     Right upper body: No supraclavicular adenopathy.     Left upper body: No supraclavicular adenopathy.     Lower Body: No right inguinal adenopathy. No left inguinal adenopathy.  Skin:    General: Skin is warm and dry.     Coloration: Skin is not pale.     Findings: No bruising, erythema, lesion or rash.  Neurological:     Mental Status: He is alert and oriented to person, place, and time.  Psychiatric:        Behavior: Behavior normal.        Thought Content: Thought content normal.        Judgment: Judgment normal.    Appointment on 09/23/2019  Component Date Value Ref Range Status  . WBC 09/23/2019 PENDING  4.0 - 10.5 K/uL Incomplete  . RBC 09/23/2019 3.32* 4.22 - 5.81 MIL/uL Final  . Hemoglobin 09/23/2019 11.2* 13.0 - 17.0 g/dL Final  . HCT 09/23/2019 31.7* 39 - 52 % Final  . MCV 09/23/2019 95.5  80.0 - 100.0 fL Final  . MCH 09/23/2019 33.7  26.0 - 34.0 pg Final  . MCHC 09/23/2019 35.3  30.0 - 36.0 g/dL Final  . RDW 09/23/2019 13.2  11.5 - 15.5 % Final  . Platelets 09/23/2019 316  150 - 400 K/uL Final   Performed at [facility], [address], [place], [place] [address]  . nRBC 09/23/2019 PENDING  0.0 - 0.2 % Incomplete  . Neutrophils Relative % 09/23/2019 PENDING  % Incomplete  . Neutro Abs 09/23/2019 PENDING  1.7 - 7.7 K/uL Incomplete  . Band Neutrophils 09/23/2019 PENDING  % Incomplete  . Lymphocytes Relative 09/23/2019 PENDING  % Incomplete  . Lymphs Abs 09/23/2019 PENDING  0.7 - 4.0 K/uL Incomplete  . Monocytes Relative 09/23/2019 PENDING  % Incomplete  . Monocytes Absolute 09/23/2019 PENDING  0 -  1 K/uL Incomplete  . Eosinophils Relative 09/23/2019 PENDING  % Incomplete  . Eosinophils Absolute 09/23/2019 PENDING  0 - 0  K/uL Incomplete  . Basophils Relative 09/23/2019 PENDING  % Incomplete  . Basophils Absolute 09/23/2019 PENDING  0 - 0 K/uL Incomplete  . WBC Morphology 09/23/2019 PENDING   Incomplete  . RBC Morphology 09/23/2019 PENDING   Incomplete  . Smear Review 09/23/2019 PENDING   Incomplete  . Other 09/23/2019 PENDING  % Incomplete  . nRBC 09/23/2019 PENDING  0 /100 WBC Incomplete  . Metamyelocytes Relative 09/23/2019 PENDING  % Incomplete  . Myelocytes 09/23/2019 PENDING  % Incomplete  . Promyelocytes Relative 09/23/2019 PENDING  % Incomplete  . Blasts 09/23/2019 PENDING  % Incomplete  . Immature Granulocytes 09/23/2019 PENDING  % Incomplete  . Abs Immature Granulocytes 09/23/2019 PENDING  0.00 - 0.07 K/uL Incomplete  . Sodium 09/23/2019 129* 135 - 145 mmol/L Final  . Potassium 09/23/2019 4.2  3.5 - 5.1 mmol/L Final  . Chloride 09/23/2019 96* 98 - 111 mmol/L Final  . CO2 09/23/2019 23  22 - 32 mmol/L Final  . Glucose, Bld 09/23/2019 105* 70 - 99 mg/dL Final   Glucose reference range applies only to samples taken after fasting for at least 8 hours.  . BUN 09/23/2019 6* 8 - 23 mg/dL Final  . Creatinine, Ser 09/23/2019 0.72  0.61 - 1.24 mg/dL Final  . Calcium 09/23/2019 8.4* 8.9 - 10.3 mg/dL Final  . Total Protein 09/23/2019 6.7  6.5 - 8.1 g/dL Final  . Albumin 09/23/2019 3.4* 3.5 - 5.0 g/dL Final  . AST 09/23/2019 20  15 - 41 U/L Final  . ALT 09/23/2019 14  0 - 44 U/L Final  . Alkaline Phosphatase 09/23/2019 96  38 - 126 U/L Final  . Total Bilirubin 09/23/2019 0.2* 0.3 - 1.2 mg/dL Final  . GFR calc non Af Amer 09/23/2019 >60  >60 mL/min Final  . GFR calc Af Amer 09/23/2019 >60  >60 mL/min Final  . Anion gap 09/23/2019 10  5 - 15 Final   Performed at [facility], [address] [place], [place] [address]  . Magnesium 09/23/2019 1.7  1.7 - 2.4 mg/dL Final   Performed at [facility], [address] [place], [place] [address]  . LDH 09/23/2019 206* 98 - 192 U/L  Final   Performed at [facility], [address], [place], [place] [address]    Assessment:  LAREN WHALING is a 69 y.o. male with extensive stage small cell lung cancer. He has a >40 pack year smoking. He presented with a recurrent left sided pleural effusion. He has undergone thoracentesis x 2 in the past month. He is s/p pigtail drain. Initial cytology x 2 was negative (lymphocytic exudative effusion).Pleural fluid cytologyon 08/18/2019 confirmed small cell lung cancer.  Chest CT without contraston 08/19/2019 revealed the left pleural pigtail drain. There was a small residual left hydropneumothorax. There was a 6.7 x 4 cm medial basilar left lower lobe lung masssuspicious for primary bronchogenic carcinoma. There was a lingular 1.0 cm solid pulmonary nodulesuspicious for ipsilateral metastasis. There was intralobular septal thickening and patchy groundglass opacity throughout the lingula and the left lower lobe with moderate residual atelectasis of the left lung base. A component of lymphangitic carcinomatosis was suspected. There was ipsilateral infra hilar, subcarinal, AP window, prevascular and upper retroperitoneal adenopathyc/wmetastatic disease.  Head MRI on 08/21/2019 revealed no evidence of metastatic disease.  PET scan on 05/20/20201 revealed  markedly hypermetabolic left lower lobe pulmonary lesion (SUV 7.9) c/w the patient's known malignancy. There was associated hypermetabolic nodal metastases in the mediastinum and left hilum with metastatic involvement of left pleural space. Additional sites included upper abdominal hypermetabolic lymphadenopathy and scattered hypermetabolic bone metastases (left acetabulum, sacrum, and L5). There was interval decrease in left pleural effusion with areas of loculation.  CEA was 3.7 on 09/02/2019.  He is s/p cycle #1 carboplatin, etoposide, and Tecentriq (09/02/2019) with Udencya support  He has  hyponatremiasecondary to SIADH.  He began sodium chloride tablets (1 gm TID) on 09/04/2019.  He has a significant family history of malignancy. A family member has had genetic testing (negative for Lynch syndrome).  Symptomatically, he feels good.  He has shortness of breath on exertion.  Exam reveals decreased breath sounds in the left base.  Plan: 1.   Labs today: CBC with diff, CMP, Mg, LDH, TSH. 2.   Metastatic small cell lung cancer Clinical stage T3N2M1 (stage IV). Pleural fluid cytology confirmed small cell lung cancer. Head MRIwas negative forCNS metastasis. He is s/p cycle #1 carboplatin, etoposide and Tecentriq.   He tolerated cycle #1 well.              Pleural fluid has not reaccumulated.  Labs reviewed.  Begin cycle #2 of carboplatin, etoposide and Tecentriq.  Discussed plan for restaging after cycle #2   Discuss symptom management.  He has antiemetics at home to use on a prn bases.  Interventions are adequate.    3.Left pleural effusion             Patient is s/p thoracentesis x 2 followed by pig tail drainage in hospital.             Clinically, he is doing well.  Exam reveals only decreased breath sounds at the left base. 4.   Hyponatremia Sodium 129 on salt tablets 1 gm po TID.  Etiology secondary to SIADH             Refill salt tablets.             Continue free water restriction; patient to drink fluids with electrolytes. 5. Cancer-related pain             Pain remains well controlled with Lortab 5/325 as needed. 6.   History of atrial fibrillation             Patient followed by Dr Ubaldo Glassing and is on Eliquis.             Platelet count is 316,000  Ensure platelets > 50,000.  Discuss close monitoring of counts. 7.   Day 1 of cycle #2 of carboplatin, etoposide, and Tecentriq. 8.   RTC on Tuesday and Wednesday for etoposide. 9.   RTC on Thursday for Udencya 10.   RTC on 06/24 for nadir  assessment, labs (CBC with diff, BMP), and +/- Xgeva. 11.   Chest, abdomen, and pelvis CT on 10/11/2019. 12.   Patient requires transportation for the clinic.   I discussed the assessment and treatment plan with the patient.  The patient was provided an opportunity to ask questions and all were answered.  The patient agreed with the plan and demonstrated an understanding of the instructions.  The patient was advised to call back if the symptoms worsen or if the condition fails to improve as anticipated.  I provided 12 minutes of face-to-face time during this this encounter and > 50% was spent counseling as documented  under my assessment and plan.    Lequita Asal, MD, PhD    09/23/2019, 9:44 AM  I, Selena Batten, am acting as scribe for [employer]. Mike Gip, MD, PhD.  I, Ellison Rieth C. Mike Gip, MD, have reviewed the above documentation for accuracy and completeness, and I agree with the above.

## 2019-09-22 ENCOUNTER — Other Ambulatory Visit: Payer: Self-pay | Admitting: Hematology and Oncology

## 2019-09-22 DIAGNOSIS — Z5112 Encounter for antineoplastic immunotherapy: Secondary | ICD-10-CM

## 2019-09-22 DIAGNOSIS — C3490 Malignant neoplasm of unspecified part of unspecified bronchus or lung: Secondary | ICD-10-CM

## 2019-09-23 ENCOUNTER — Encounter: Payer: Self-pay | Admitting: Hematology and Oncology

## 2019-09-23 ENCOUNTER — Inpatient Hospital Stay: Payer: Medicare Other

## 2019-09-23 ENCOUNTER — Inpatient Hospital Stay (HOSPITAL_BASED_OUTPATIENT_CLINIC_OR_DEPARTMENT_OTHER): Payer: Medicare Other | Admitting: Hematology and Oncology

## 2019-09-23 ENCOUNTER — Other Ambulatory Visit: Payer: Self-pay

## 2019-09-23 VITALS — BP 108/70 | HR 74 | Temp 96.0°F | Wt 134.0 lb

## 2019-09-23 DIAGNOSIS — I4891 Unspecified atrial fibrillation: Secondary | ICD-10-CM | POA: Diagnosis not present

## 2019-09-23 DIAGNOSIS — J910 Malignant pleural effusion: Secondary | ICD-10-CM | POA: Diagnosis not present

## 2019-09-23 DIAGNOSIS — I10 Essential (primary) hypertension: Secondary | ICD-10-CM | POA: Diagnosis not present

## 2019-09-23 DIAGNOSIS — E871 Hypo-osmolality and hyponatremia: Secondary | ICD-10-CM

## 2019-09-23 DIAGNOSIS — C7951 Secondary malignant neoplasm of bone: Secondary | ICD-10-CM

## 2019-09-23 DIAGNOSIS — D696 Thrombocytopenia, unspecified: Secondary | ICD-10-CM | POA: Diagnosis not present

## 2019-09-23 DIAGNOSIS — Z809 Family history of malignant neoplasm, unspecified: Secondary | ICD-10-CM | POA: Diagnosis not present

## 2019-09-23 DIAGNOSIS — D649 Anemia, unspecified: Secondary | ICD-10-CM

## 2019-09-23 DIAGNOSIS — Z5112 Encounter for antineoplastic immunotherapy: Secondary | ICD-10-CM

## 2019-09-23 DIAGNOSIS — C3490 Malignant neoplasm of unspecified part of unspecified bronchus or lung: Secondary | ICD-10-CM

## 2019-09-23 DIAGNOSIS — G893 Neoplasm related pain (acute) (chronic): Secondary | ICD-10-CM | POA: Diagnosis not present

## 2019-09-23 DIAGNOSIS — E43 Unspecified severe protein-calorie malnutrition: Secondary | ICD-10-CM | POA: Diagnosis not present

## 2019-09-23 DIAGNOSIS — R110 Nausea: Secondary | ICD-10-CM | POA: Diagnosis not present

## 2019-09-23 DIAGNOSIS — Z79899 Other long term (current) drug therapy: Secondary | ICD-10-CM | POA: Diagnosis not present

## 2019-09-23 DIAGNOSIS — J90 Pleural effusion, not elsewhere classified: Secondary | ICD-10-CM | POA: Diagnosis not present

## 2019-09-23 DIAGNOSIS — Z5189 Encounter for other specified aftercare: Secondary | ICD-10-CM | POA: Diagnosis not present

## 2019-09-23 DIAGNOSIS — Z7901 Long term (current) use of anticoagulants: Secondary | ICD-10-CM | POA: Diagnosis not present

## 2019-09-23 DIAGNOSIS — Z5111 Encounter for antineoplastic chemotherapy: Secondary | ICD-10-CM

## 2019-09-23 DIAGNOSIS — E860 Dehydration: Secondary | ICD-10-CM | POA: Diagnosis not present

## 2019-09-23 DIAGNOSIS — D701 Agranulocytosis secondary to cancer chemotherapy: Secondary | ICD-10-CM | POA: Diagnosis not present

## 2019-09-23 DIAGNOSIS — T451X5A Adverse effect of antineoplastic and immunosuppressive drugs, initial encounter: Secondary | ICD-10-CM | POA: Diagnosis not present

## 2019-09-23 DIAGNOSIS — C3432 Malignant neoplasm of lower lobe, left bronchus or lung: Secondary | ICD-10-CM | POA: Diagnosis not present

## 2019-09-23 DIAGNOSIS — E222 Syndrome of inappropriate secretion of antidiuretic hormone: Secondary | ICD-10-CM | POA: Diagnosis not present

## 2019-09-23 DIAGNOSIS — C781 Secondary malignant neoplasm of mediastinum: Secondary | ICD-10-CM | POA: Diagnosis not present

## 2019-09-23 LAB — COMPREHENSIVE METABOLIC PANEL
ALT: 14 U/L (ref 0–44)
AST: 20 U/L (ref 15–41)
Albumin: 3.4 g/dL — ABNORMAL LOW (ref 3.5–5.0)
Alkaline Phosphatase: 96 U/L (ref 38–126)
Anion gap: 10 (ref 5–15)
BUN: 6 mg/dL — ABNORMAL LOW (ref 8–23)
CO2: 23 mmol/L (ref 22–32)
Calcium: 8.4 mg/dL — ABNORMAL LOW (ref 8.9–10.3)
Chloride: 96 mmol/L — ABNORMAL LOW (ref 98–111)
Creatinine, Ser: 0.72 mg/dL (ref 0.61–1.24)
GFR calc Af Amer: >60 mL/min (ref >60)
GFR calc non Af Amer: >60 mL/min (ref >60)
Glucose, Bld: 105 mg/dL — ABNORMAL HIGH (ref 70–99)
Potassium: 4.2 mmol/L (ref 3.5–5.1)
Sodium: 129 mmol/L — ABNORMAL LOW (ref 135–145)
Total Bilirubin: 0.2 mg/dL — ABNORMAL LOW (ref 0.3–1.2)
Total Protein: 6.7 g/dL (ref 6.5–8.1)

## 2019-09-23 LAB — CBC WITH DIFFERENTIAL/PLATELET
Abs Immature Granulocytes: 1 10*3/uL — ABNORMAL HIGH (ref 0.00–0.07)
Band Neutrophils: 4 %
Basophils Absolute: 0 10*3/uL (ref 0.0–0.1)
Basophils Relative: 0 %
Eosinophils Absolute: 0 10*3/uL (ref 0.0–0.5)
Eosinophils Relative: 0 %
HCT: 31.7 % — ABNORMAL LOW (ref 39.0–52.0)
Hemoglobin: 11.2 g/dL — ABNORMAL LOW (ref 13.0–17.0)
Lymphocytes Relative: 23 %
Lymphs Abs: 2.8 10*3/uL (ref 0.7–4.0)
MCH: 33.7 pg (ref 26.0–34.0)
MCHC: 35.3 g/dL (ref 30.0–36.0)
MCV: 95.5 fL (ref 80.0–100.0)
Metamyelocytes Relative: 3 %
Monocytes Absolute: 1 10*3/uL (ref 0.1–1.0)
Monocytes Relative: 8 %
Myelocytes: 5 %
Neutro Abs: 7.5 10*3/uL (ref 1.7–7.7)
Neutrophils Relative %: 57 %
Platelets: 316 10*3/uL (ref 150–400)
RBC Morphology: NONE SEEN
RBC: 3.32 MIL/uL — ABNORMAL LOW (ref 4.22–5.81)
RDW: 13.2 % (ref 11.5–15.5)
WBC: 12.3 10*3/uL — ABNORMAL HIGH (ref 4.0–10.5)
nRBC: 0 % (ref 0.0–0.2)

## 2019-09-23 LAB — LACTATE DEHYDROGENASE: LDH: 206 U/L — ABNORMAL HIGH (ref 98–192)

## 2019-09-23 LAB — MAGNESIUM: Magnesium: 1.7 mg/dL (ref 1.7–2.4)

## 2019-09-23 LAB — TSH: TSH: 1.27 u[IU]/mL (ref 0.350–4.500)

## 2019-09-23 MED ORDER — SODIUM CHLORIDE 0.9 % IV SOLN
150.0000 mg | Freq: Once | INTRAVENOUS | Status: AC
  Administered 2019-09-23: 150 mg via INTRAVENOUS
  Filled 2019-09-23: qty 5

## 2019-09-23 MED ORDER — SODIUM CHLORIDE 0.9 % IV SOLN
Freq: Once | INTRAVENOUS | Status: AC
  Filled 2019-09-23: qty 250

## 2019-09-23 MED ORDER — SODIUM CHLORIDE 1 G PO TABS: 1.0000 g | ORAL_TABLET | Freq: Three times a day (TID) | ORAL | 1 refills | Status: DC

## 2019-09-23 MED ORDER — PALONOSETRON HCL INJECTION 0.25 MG/5ML
INTRAVENOUS | Status: AC
  Filled 2019-09-23: qty 5

## 2019-09-23 MED ORDER — DENOSUMAB 120 MG/1.7ML ~~LOC~~ SOLN: 120.0000 mg | Freq: Once | SUBCUTANEOUS | Status: DC

## 2019-09-23 MED ORDER — PALONOSETRON HCL INJECTION 0.25 MG/5ML
0.2500 mg | Freq: Once | INTRAVENOUS | Status: AC
  Administered 2019-09-23: 0.25 mg via INTRAVENOUS

## 2019-09-23 MED ORDER — SODIUM CHLORIDE 0.9 % IV SOLN
70.0000 mg/m2 | Freq: Once | INTRAVENOUS | Status: DC
  Filled 2019-09-23: qty 6.5

## 2019-09-23 MED ORDER — SODIUM CHLORIDE 0.9 % IV SOLN
1200.0000 mg | Freq: Once | INTRAVENOUS | Status: AC
  Administered 2019-09-23: 1200 mg via INTRAVENOUS
  Filled 2019-09-23: qty 20

## 2019-09-23 MED ORDER — SODIUM CHLORIDE 0.9 % IV SOLN
444.0000 mg | Freq: Once | INTRAVENOUS | Status: AC
  Administered 2019-09-23: 440 mg via INTRAVENOUS
  Filled 2019-09-23: qty 44

## 2019-09-23 MED ORDER — SODIUM CHLORIDE 0.9 % IV SOLN
120.0000 mg | Freq: Once | INTRAVENOUS | Status: AC
  Administered 2019-09-23: 120 mg via INTRAVENOUS
  Filled 2019-09-23: qty 6

## 2019-09-23 MED ORDER — HEPARIN SOD (PORK) LOCK FLUSH 100 UNIT/ML IV SOLN
500.0000 [IU] | Freq: Once | INTRAVENOUS | Status: DC
  Filled 2019-09-23: qty 5

## 2019-09-23 MED ORDER — HEPARIN SOD (PORK) LOCK FLUSH 100 UNIT/ML IV SOLN
INTRAVENOUS | Status: AC
  Filled 2019-09-23: qty 5

## 2019-09-23 MED ORDER — SODIUM CHLORIDE 0.9 % IV SOLN
10.0000 mg | Freq: Once | INTRAVENOUS | Status: AC
  Administered 2019-09-23: 10 mg via INTRAVENOUS
  Filled 2019-09-23: qty 1

## 2019-09-23 NOTE — Progress Notes (Unsigned)
09/23/2019  Hold Delton See today per MD.  T.O. Dr Hillery Hunter, PharmD

## 2019-09-24 ENCOUNTER — Inpatient Hospital Stay: Payer: Medicare Other

## 2019-09-24 VITALS — BP 107/66 | HR 62 | Temp 97.3°F | Resp 18

## 2019-09-24 DIAGNOSIS — Z7901 Long term (current) use of anticoagulants: Secondary | ICD-10-CM | POA: Diagnosis not present

## 2019-09-24 DIAGNOSIS — T451X5A Adverse effect of antineoplastic and immunosuppressive drugs, initial encounter: Secondary | ICD-10-CM | POA: Diagnosis not present

## 2019-09-24 DIAGNOSIS — C781 Secondary malignant neoplasm of mediastinum: Secondary | ICD-10-CM | POA: Diagnosis not present

## 2019-09-24 DIAGNOSIS — D696 Thrombocytopenia, unspecified: Secondary | ICD-10-CM | POA: Diagnosis not present

## 2019-09-24 DIAGNOSIS — I10 Essential (primary) hypertension: Secondary | ICD-10-CM | POA: Diagnosis not present

## 2019-09-24 DIAGNOSIS — C3432 Malignant neoplasm of lower lobe, left bronchus or lung: Secondary | ICD-10-CM | POA: Diagnosis not present

## 2019-09-24 DIAGNOSIS — Z79899 Other long term (current) drug therapy: Secondary | ICD-10-CM | POA: Diagnosis not present

## 2019-09-24 DIAGNOSIS — Z809 Family history of malignant neoplasm, unspecified: Secondary | ICD-10-CM | POA: Diagnosis not present

## 2019-09-24 DIAGNOSIS — I4891 Unspecified atrial fibrillation: Secondary | ICD-10-CM | POA: Diagnosis not present

## 2019-09-24 DIAGNOSIS — R110 Nausea: Secondary | ICD-10-CM | POA: Diagnosis not present

## 2019-09-24 DIAGNOSIS — G893 Neoplasm related pain (acute) (chronic): Secondary | ICD-10-CM | POA: Diagnosis not present

## 2019-09-24 DIAGNOSIS — D701 Agranulocytosis secondary to cancer chemotherapy: Secondary | ICD-10-CM | POA: Diagnosis not present

## 2019-09-24 DIAGNOSIS — Z5189 Encounter for other specified aftercare: Secondary | ICD-10-CM | POA: Diagnosis not present

## 2019-09-24 DIAGNOSIS — E860 Dehydration: Secondary | ICD-10-CM | POA: Diagnosis not present

## 2019-09-24 DIAGNOSIS — C3490 Malignant neoplasm of unspecified part of unspecified bronchus or lung: Secondary | ICD-10-CM

## 2019-09-24 DIAGNOSIS — C7951 Secondary malignant neoplasm of bone: Secondary | ICD-10-CM | POA: Diagnosis not present

## 2019-09-24 DIAGNOSIS — Z5111 Encounter for antineoplastic chemotherapy: Secondary | ICD-10-CM | POA: Diagnosis not present

## 2019-09-24 DIAGNOSIS — Z5112 Encounter for antineoplastic immunotherapy: Secondary | ICD-10-CM | POA: Diagnosis not present

## 2019-09-24 DIAGNOSIS — E222 Syndrome of inappropriate secretion of antidiuretic hormone: Secondary | ICD-10-CM | POA: Diagnosis not present

## 2019-09-24 DIAGNOSIS — J90 Pleural effusion, not elsewhere classified: Secondary | ICD-10-CM | POA: Diagnosis not present

## 2019-09-24 LAB — T4: T4, Total: 6.6 ug/dL (ref 4.5–12.0)

## 2019-09-24 MED ORDER — SODIUM CHLORIDE 0.9 % IV SOLN
120.0000 mg | Freq: Once | INTRAVENOUS | Status: AC
  Administered 2019-09-24: 120 mg via INTRAVENOUS
  Filled 2019-09-24: qty 6

## 2019-09-24 MED ORDER — SODIUM CHLORIDE 0.9% FLUSH
10.0000 mL | INTRAVENOUS | Status: DC | PRN
  Administered 2019-09-24: 10 mL
  Filled 2019-09-24: qty 10

## 2019-09-24 MED ORDER — SODIUM CHLORIDE 0.9 % IV SOLN
Freq: Once | INTRAVENOUS | Status: AC
  Filled 2019-09-24: qty 250

## 2019-09-24 MED ORDER — SODIUM CHLORIDE 0.9 % IV SOLN
10.0000 mg | Freq: Once | INTRAVENOUS | Status: AC
  Administered 2019-09-24: 10 mg via INTRAVENOUS
  Filled 2019-09-24: qty 10

## 2019-09-24 MED ORDER — HEPARIN SOD (PORK) LOCK FLUSH 100 UNIT/ML IV SOLN
500.0000 [IU] | Freq: Once | INTRAVENOUS | Status: AC | PRN
  Administered 2019-09-24: 500 [IU]
  Filled 2019-09-24: qty 5

## 2019-09-25 ENCOUNTER — Inpatient Hospital Stay: Payer: Medicare Other

## 2019-09-25 ENCOUNTER — Other Ambulatory Visit: Payer: Self-pay

## 2019-09-25 VITALS — BP 130/80 | HR 79 | Temp 97.6°F | Resp 20

## 2019-09-25 DIAGNOSIS — Z5112 Encounter for antineoplastic immunotherapy: Secondary | ICD-10-CM | POA: Diagnosis not present

## 2019-09-25 DIAGNOSIS — Z7901 Long term (current) use of anticoagulants: Secondary | ICD-10-CM | POA: Diagnosis not present

## 2019-09-25 DIAGNOSIS — I10 Essential (primary) hypertension: Secondary | ICD-10-CM | POA: Diagnosis not present

## 2019-09-25 DIAGNOSIS — C3432 Malignant neoplasm of lower lobe, left bronchus or lung: Secondary | ICD-10-CM | POA: Diagnosis not present

## 2019-09-25 DIAGNOSIS — Z79899 Other long term (current) drug therapy: Secondary | ICD-10-CM | POA: Diagnosis not present

## 2019-09-25 DIAGNOSIS — T451X5A Adverse effect of antineoplastic and immunosuppressive drugs, initial encounter: Secondary | ICD-10-CM | POA: Diagnosis not present

## 2019-09-25 DIAGNOSIS — J90 Pleural effusion, not elsewhere classified: Secondary | ICD-10-CM | POA: Diagnosis not present

## 2019-09-25 DIAGNOSIS — D701 Agranulocytosis secondary to cancer chemotherapy: Secondary | ICD-10-CM | POA: Diagnosis not present

## 2019-09-25 DIAGNOSIS — C7951 Secondary malignant neoplasm of bone: Secondary | ICD-10-CM | POA: Diagnosis not present

## 2019-09-25 DIAGNOSIS — R110 Nausea: Secondary | ICD-10-CM | POA: Diagnosis not present

## 2019-09-25 DIAGNOSIS — G893 Neoplasm related pain (acute) (chronic): Secondary | ICD-10-CM | POA: Diagnosis not present

## 2019-09-25 DIAGNOSIS — I4891 Unspecified atrial fibrillation: Secondary | ICD-10-CM | POA: Diagnosis not present

## 2019-09-25 DIAGNOSIS — C781 Secondary malignant neoplasm of mediastinum: Secondary | ICD-10-CM | POA: Diagnosis not present

## 2019-09-25 DIAGNOSIS — E860 Dehydration: Secondary | ICD-10-CM | POA: Diagnosis not present

## 2019-09-25 DIAGNOSIS — Z809 Family history of malignant neoplasm, unspecified: Secondary | ICD-10-CM | POA: Diagnosis not present

## 2019-09-25 DIAGNOSIS — D696 Thrombocytopenia, unspecified: Secondary | ICD-10-CM | POA: Diagnosis not present

## 2019-09-25 DIAGNOSIS — Z5189 Encounter for other specified aftercare: Secondary | ICD-10-CM | POA: Diagnosis not present

## 2019-09-25 DIAGNOSIS — C3490 Malignant neoplasm of unspecified part of unspecified bronchus or lung: Secondary | ICD-10-CM

## 2019-09-25 DIAGNOSIS — Z5111 Encounter for antineoplastic chemotherapy: Secondary | ICD-10-CM | POA: Diagnosis not present

## 2019-09-25 DIAGNOSIS — E222 Syndrome of inappropriate secretion of antidiuretic hormone: Secondary | ICD-10-CM | POA: Diagnosis not present

## 2019-09-25 MED ORDER — HEPARIN SOD (PORK) LOCK FLUSH 100 UNIT/ML IV SOLN
INTRAVENOUS | Status: AC
  Filled 2019-09-25: qty 5

## 2019-09-25 MED ORDER — HEPARIN SOD (PORK) LOCK FLUSH 100 UNIT/ML IV SOLN
500.0000 [IU] | Freq: Once | INTRAVENOUS | Status: AC | PRN
  Administered 2019-09-25: 500 [IU]
  Filled 2019-09-25: qty 5

## 2019-09-25 MED ORDER — SODIUM CHLORIDE 0.9 % IV SOLN
120.0000 mg | Freq: Once | INTRAVENOUS | Status: AC
  Administered 2019-09-25: 120 mg via INTRAVENOUS
  Filled 2019-09-25: qty 6

## 2019-09-25 MED ORDER — SODIUM CHLORIDE 0.9 % IV SOLN
Freq: Once | INTRAVENOUS | Status: AC
  Filled 2019-09-25: qty 250

## 2019-09-25 MED ORDER — DEXAMETHASONE SODIUM PHOSPHATE 10 MG/ML IJ SOLN
INTRAMUSCULAR | Status: AC
  Filled 2019-09-25: qty 1

## 2019-09-25 MED ORDER — SODIUM CHLORIDE 0.9 % IV SOLN
10.0000 mg | Freq: Once | INTRAVENOUS | Status: AC
  Administered 2019-09-25: 10 mg via INTRAVENOUS
  Filled 2019-09-25: qty 10

## 2019-09-26 ENCOUNTER — Inpatient Hospital Stay: Payer: Medicare Other

## 2019-09-26 VITALS — BP 123/84 | HR 81 | Temp 97.0°F | Resp 18

## 2019-09-26 DIAGNOSIS — E222 Syndrome of inappropriate secretion of antidiuretic hormone: Secondary | ICD-10-CM | POA: Diagnosis not present

## 2019-09-26 DIAGNOSIS — D696 Thrombocytopenia, unspecified: Secondary | ICD-10-CM | POA: Diagnosis not present

## 2019-09-26 DIAGNOSIS — D701 Agranulocytosis secondary to cancer chemotherapy: Secondary | ICD-10-CM | POA: Diagnosis not present

## 2019-09-26 DIAGNOSIS — Z809 Family history of malignant neoplasm, unspecified: Secondary | ICD-10-CM | POA: Diagnosis not present

## 2019-09-26 DIAGNOSIS — C3432 Malignant neoplasm of lower lobe, left bronchus or lung: Secondary | ICD-10-CM | POA: Diagnosis not present

## 2019-09-26 DIAGNOSIS — C781 Secondary malignant neoplasm of mediastinum: Secondary | ICD-10-CM | POA: Diagnosis not present

## 2019-09-26 DIAGNOSIS — C7951 Secondary malignant neoplasm of bone: Secondary | ICD-10-CM | POA: Diagnosis not present

## 2019-09-26 DIAGNOSIS — Z7901 Long term (current) use of anticoagulants: Secondary | ICD-10-CM | POA: Diagnosis not present

## 2019-09-26 DIAGNOSIS — G893 Neoplasm related pain (acute) (chronic): Secondary | ICD-10-CM | POA: Diagnosis not present

## 2019-09-26 DIAGNOSIS — T451X5A Adverse effect of antineoplastic and immunosuppressive drugs, initial encounter: Secondary | ICD-10-CM | POA: Diagnosis not present

## 2019-09-26 DIAGNOSIS — J90 Pleural effusion, not elsewhere classified: Secondary | ICD-10-CM | POA: Diagnosis not present

## 2019-09-26 DIAGNOSIS — C3490 Malignant neoplasm of unspecified part of unspecified bronchus or lung: Secondary | ICD-10-CM

## 2019-09-26 DIAGNOSIS — R110 Nausea: Secondary | ICD-10-CM | POA: Diagnosis not present

## 2019-09-26 DIAGNOSIS — I10 Essential (primary) hypertension: Secondary | ICD-10-CM | POA: Diagnosis not present

## 2019-09-26 DIAGNOSIS — I4891 Unspecified atrial fibrillation: Secondary | ICD-10-CM | POA: Diagnosis not present

## 2019-09-26 DIAGNOSIS — Z79899 Other long term (current) drug therapy: Secondary | ICD-10-CM | POA: Diagnosis not present

## 2019-09-26 DIAGNOSIS — E860 Dehydration: Secondary | ICD-10-CM | POA: Diagnosis not present

## 2019-09-26 DIAGNOSIS — Z5112 Encounter for antineoplastic immunotherapy: Secondary | ICD-10-CM | POA: Diagnosis not present

## 2019-09-26 DIAGNOSIS — Z5111 Encounter for antineoplastic chemotherapy: Secondary | ICD-10-CM | POA: Diagnosis not present

## 2019-09-26 DIAGNOSIS — Z5189 Encounter for other specified aftercare: Secondary | ICD-10-CM | POA: Diagnosis not present

## 2019-09-26 MED ORDER — DENOSUMAB 120 MG/1.7ML ~~LOC~~ SOLN: 120.0000 mg | Freq: Once | SUBCUTANEOUS | Status: DC

## 2019-09-26 MED ORDER — PEGFILGRASTIM-CBQV 6 MG/0.6ML ~~LOC~~ SOSY
6.0000 mg | PREFILLED_SYRINGE | Freq: Once | SUBCUTANEOUS | Status: AC
  Administered 2019-09-26: 6 mg via SUBCUTANEOUS
  Filled 2019-09-26: qty 0.6

## 2019-09-27 ENCOUNTER — Encounter: Payer: Self-pay | Admitting: Cardiothoracic Surgery

## 2019-09-27 ENCOUNTER — Ambulatory Visit (INDEPENDENT_AMBULATORY_CARE_PROVIDER_SITE_OTHER): Payer: Self-pay | Admitting: Cardiothoracic Surgery

## 2019-09-27 ENCOUNTER — Other Ambulatory Visit: Payer: Self-pay

## 2019-09-27 VITALS — BP 90/62 | HR 147 | Temp 97.7°F | Ht 74.0 in | Wt 139.2 lb

## 2019-09-27 DIAGNOSIS — Z09 Encounter for follow-up examination after completed treatment for conditions other than malignant neoplasm: Secondary | ICD-10-CM

## 2019-09-27 NOTE — Progress Notes (Signed)
No problems.  Port working without difficulties  Wounds clean and dry. Lungs decreased at left base Heart tachycardic but no murmur  Scheduled for CT in one month.  RTC prn.  Tim [employer]

## 2019-09-27 NOTE — Patient Instructions (Signed)
   Follow-up with our office as needed.  Please call and ask to speak with a nurse if you develop questions or concerns.  

## 2019-10-02 NOTE — Progress Notes (Signed)
[facility]  [address], [address] [place], [place] [address] Phone: [phone]  Fax: [phone]   Clinic Day:  10/03/2019  Referring physician: No ref. provider found  Chief Complaint: Scott Jordan is a 69 y.o. male with extensive stage small cell lung cancer who is seen for nadir assessment on day 11 of cycle #2 carboplatin, etoposide and Tecentriq.  HPI: The patient was last seen in the medical oncology clinic on 09/23/2019. At that time, he felt good.  He had exertional shortness of breath. Hematocrit was 31.7, hemoglobin 11.2, MCV 95.5, platelets 316,000, WBC 12,300 (ANC 7,500). Sodium was 129.  Calcium was 8.4. Albumin was 3.4.  He began cycle #2 carboplatin, etoposide, and Tecentriq. Delton See was held. He received Udencya on 09/26/2019.  The patient saw Dr.Oaks on 09/27/2019. He was tachycardic and lung sounds were decreased at the left base. His port was working well.  He was to follow-up as needed.  During the interim, he has had no energy. He reports that he gets lightheaded when he stands up. Denies fevers, chills, weight loss, nausea, vomiting, and diarrhea. He has his mom's potassium at home but does not take it.  The patient would like to hold off on Xgeva and does not have plans to see a dentist in the near future.    Past Medical History:  Diagnosis Date  . A-fib ([place]) 01/10/2019   pt st this was dx by Dr. Ubaldo Glassing  . Cancer ([place]) 2021   LUNG  . Complication of anesthesia    DIFFICULTY WAKING UP AFTER SURGERY- 20 YRS AGO  . Hypertension   . Lung cancer ([place])   . Substance abuse [facility])     Past Surgical History:  Procedure Laterality Date  . ARM DEBRIDEMENT Left    INCISION AND DEBRIDEMENT LOWER ARM -20 YRS AGO  . BACK SURGERY    . PORTACATH PLACEMENT Left 08/29/2019   Procedure: INSERTION PORT-A-CATH;  Surgeon: Nestor Lewandowsky, MD;  Location: [facility] ORS;  Service: General;  Laterality: Left;    No family history on file.  Social History:   reports that he has quit smoking. His smoking use included cigarettes. He smoked 0.50 packs per day. He has never used smokeless tobacco. He reports current alcohol use. He reports previous drug use. Drug: Heroin. The patient denies any exposure to radiation or toxins. The patient lives in [place]. He has family friends staying with him to help care for his needs.His niece , Scott Jordan [phone]), is his medical power of attorney. The patient is alone today.  Allergies: No Known Allergies  Current Medications: Current Outpatient Medications  Medication Sig Dispense Refill  . diltiazem (CARDIZEM CD) 240 MG 24 hr capsule Take 1 capsule (240 mg total) by mouth daily. 30 capsule 2  . ELIQUIS 5 MG TABS tablet Take 5 mg by mouth 2 (two) times daily.    Marland Kitchen escitalopram (LEXAPRO) 10 MG tablet Take 1 tablet (10 mg total) by mouth daily. 90 tablet 0  . feeding supplement, ENSURE ENLIVE, (ENSURE ENLIVE) LIQD Take 237 mLs by mouth 3 (three) times daily between meals. 237 mL 12  . furosemide (LASIX) 20 MG tablet Take 1 tablet (20 mg total) by mouth daily. 20 tablet 1  . HYDROcodone-acetaminophen (NORCO/VICODIN) 5-325 MG tablet Take 1 tablet by mouth every 6 (six) hours as needed for moderate pain. 30 tablet 0  . losartan (COZAAR) 25 MG tablet Take 1 tablet (25 mg total) by mouth daily. 30 tablet 2  .  sodium chloride 1 g tablet Take 1 tablet (1 g total) by mouth 3 (three) times daily with meals. 90 tablet 1  . lidocaine-prilocaine (EMLA) cream Apply to affected area once (Patient not taking: Reported on 10/03/2019) 30 g 3  . nystatin (MYCOSTATIN) 100000 UNIT/ML suspension Use as directed 5 mLs (500,000 Units total) in the mouth or throat 4 (four) times daily. Swish and spit (Patient not taking: Reported on 10/03/2019) 60 mL 0  . ondansetron (ZOFRAN) 8 MG tablet Take 1 tablet (8 mg total) by mouth every 8 (eight) hours as needed for refractory nausea / vomiting. Start on day 3 after carboplatin chemo.  (Patient not taking: Reported on 10/03/2019) 30 tablet 1   No current facility-administered medications for this visit.   Facility-Administered Medications Ordered in Other Visits  Medication Dose Route Frequency Provider Last Rate Last Admin  . heparin lock flush 100 unit/mL  500 Units Intravenous Once Lequita Asal, MD        Review of Systems  Constitutional: Positive for malaise/fatigue. Negative for chills, diaphoresis, fever and weight loss (up 1 lb).       Poor energy.  HENT: Negative for congestion, ear discharge, ear pain, hearing loss, nosebleeds, sinus pain, sore throat and tinnitus.   Eyes: Negative for blurred vision and double vision.  Respiratory: Positive for sputum production (thick white phlegm). Negative for cough, hemoptysis and shortness of breath.   Cardiovascular: Negative.  Negative for chest pain, palpitations and leg swelling.  Gastrointestinal: Negative for abdominal pain, blood in stool, constipation, diarrhea, heartburn, melena, nausea and vomiting.       Improved appetite. He drinks 2 Boosts per day.  Genitourinary: Negative for dysuria, frequency, hematuria and urgency.  Musculoskeletal: Negative.  Negative for back pain, joint pain, myalgias and neck pain.  Skin: Negative.  Negative for itching and rash.  Neurological: Positive for dizziness (upon standing). Negative for sensory change, focal weakness, weakness and headaches.  Endo/Heme/Allergies: Negative.  Does not bruise/bleed easily.  Psychiatric/Behavioral: Negative for depression and memory loss. The patient is not nervous/anxious and does not have insomnia.   All other systems reviewed and are negative.  Performance status (ECOG): 1-2  Vitals Blood pressure 107/72, pulse 90, temperature (!) 97.4 F (36.3 C), temperature source Tympanic, weight 135 lb 2.3 oz (61.3 kg), SpO2 100 %.   Physical Exam Vitals and nursing note reviewed.  Constitutional:      General: He is not in acute  distress.    Appearance: Normal appearance. He is well-developed. He is not diaphoretic.     Interventions: Face mask in place.     Comments: Thin, chronically fatigued appearing gentleman sitting comfortably in wheelchair in no acute distress.   HENT:     Head: Normocephalic and atraumatic.     Comments: Shaved head. Mask. Wearing a scarf.    Left Ear: Hearing normal.     Mouth/Throat:     Mouth: No oral lesions.  Eyes:     General: No scleral icterus.       Right eye: No discharge.        Left eye: No discharge.     Conjunctiva/sclera: Conjunctivae normal.     Pupils: Pupils are equal, round, and reactive to light.     Comments: Blue eyes.   Neck:     Vascular: No JVD.  Cardiovascular:     Rate and Rhythm: Normal rate and regular rhythm.     Heart sounds: Normal heart sounds. No murmur heard.  No friction rub. No gallop.   Pulmonary:     Effort: Pulmonary effort is normal. No respiratory distress.     Breath sounds: No wheezing, rhonchi or rales.     Comments: Decreased breath sounds right base (stable). Chest:     Chest wall: No tenderness.  Abdominal:     General: Bowel sounds are normal. There is no distension.     Palpations: Abdomen is soft. There is no mass.     Tenderness: There is no abdominal tenderness. There is no guarding or rebound.  Musculoskeletal:        General: No tenderness. Normal range of motion.     Cervical back: Normal range of motion and neck supple.  Lymphadenopathy:     Head:     Right side of head: No preauricular, posterior auricular or occipital adenopathy.     Left side of head: No preauricular, posterior auricular or occipital adenopathy.     Cervical: No cervical adenopathy.     Upper Body:     Right upper body: No supraclavicular adenopathy.     Left upper body: No supraclavicular adenopathy.     Lower Body: No right inguinal adenopathy. No left inguinal adenopathy.  Skin:    General: Skin is warm and dry.     Coloration: Skin is not  pale.     Findings: No bruising, erythema, lesion or rash.  Neurological:     Mental Status: He is alert and oriented to person, place, and time.  Psychiatric:        Behavior: Behavior normal.        Thought Content: Thought content normal.        Judgment: Judgment normal.    Appointment on 10/03/2019  Component Date Value Ref Range Status  . Sodium 10/03/2019 128* 135 - 145 mmol/L Final  . Potassium 10/03/2019 3.4* 3.5 - 5.1 mmol/L Final  . Chloride 10/03/2019 94* 98 - 111 mmol/L Final  . CO2 10/03/2019 25  22 - 32 mmol/L Final  . Glucose, Bld 10/03/2019 104* 70 - 99 mg/dL Final   Glucose reference range applies only to samples taken after fasting for at least 8 hours.  . BUN 10/03/2019 7* 8 - 23 mg/dL Final  . Creatinine, Ser 10/03/2019 0.52* 0.61 - 1.24 mg/dL Final  . Calcium 10/03/2019 8.8* 8.9 - 10.3 mg/dL Final  . GFR calc non Af Amer 10/03/2019 >60  >60 mL/min Final  . GFR calc Af Amer 10/03/2019 >60  >60 mL/min Final  . Anion gap 10/03/2019 9  5 - 15 Final   Performed at [facility], [address], [place], [place] [address]  . WBC 10/03/2019 2.9* 4.0 - 10.5 K/uL Final  . RBC 10/03/2019 3.01* 4.22 - 5.81 MIL/uL Final  . Hemoglobin 10/03/2019 10.0* 13.0 - 17.0 g/dL Final  . HCT 10/03/2019 28.0* 39 - 52 % Final  . MCV 10/03/2019 93.0  80.0 - 100.0 fL Final  . MCH 10/03/2019 33.2  26.0 - 34.0 pg Final  . MCHC 10/03/2019 35.7  30.0 - 36.0 g/dL Final  . RDW 10/03/2019 13.0  11.5 - 15.5 % Final  . Platelets 10/03/2019 58* 150 - 400 K/uL Final   Comment: Immature Platelet Fraction may be clinically indicated, consider ordering this additional test [id number]   . nRBC 10/03/2019 0.0  0.0 - 0.2 % Final  . Neutrophils Relative % 10/03/2019 21  % Final  . Neutro Abs 10/03/2019 0.7* 1.7 - 7.7 K/uL Final  .  Band Neutrophils 10/03/2019 3  % Final  . Lymphocytes Relative 10/03/2019 59  % Final  . Lymphs Abs 10/03/2019 1.7  0.7 - 4.0 K/uL Final  . Monocytes  Relative 10/03/2019 13  % Final  . Monocytes Absolute 10/03/2019 0.4  0 - 1 K/uL Final  . Eosinophils Relative 10/03/2019 0  % Final  . Eosinophils Absolute 10/03/2019 0.0  0 - 0 K/uL Final  . Basophils Relative 10/03/2019 3  % Final  . Basophils Absolute 10/03/2019 0.1  0 - 0 K/uL Final  . WBC Morphology 10/03/2019 Abnormal lymphocytes present   Final  . RBC Morphology 10/03/2019 NO SCHISTOCYTES SEEN   Final  . Smear Review 10/03/2019 Reviewed   Final  . Metamyelocytes Relative 10/03/2019 1  % Final  . Abs Immature Granulocytes 10/03/2019 0.00  0.00 - 0.07 K/uL Final  . Ovalocytes 10/03/2019 PRESENT   Final   Performed at [facility], [address], [place], [place] [address]    Assessment:  Scott Jordan is a 69 y.o. male with extensive stage small cell lung cancer. He has a >40 pack year smoking. He presented with a recurrent left sided pleural effusion. He has undergone thoracentesis x 2 in the past month. He is s/p pigtail drain. Initial cytology x 2 was negative (lymphocytic exudative effusion).Pleural fluid cytologyon 08/18/2019 confirmed small cell lung cancer.  Chest CT without contraston 08/19/2019 revealed the left pleural pigtail drain. There was a small residual left hydropneumothorax. There was a 6.7 x 4 cm medial basilar left lower lobe lung masssuspicious for primary bronchogenic carcinoma. There was a lingular 1.0 cm solid pulmonary nodulesuspicious for ipsilateral metastasis. There was intralobular septal thickening and patchy groundglass opacity throughout the lingula and the left lower lobe with moderate residual atelectasis of the left lung base. A component of lymphangitic carcinomatosis was suspected. There was ipsilateral infra hilar, subcarinal, AP window, prevascular and upper retroperitoneal adenopathyc/wmetastatic disease.  Head MRI on 08/21/2019 revealed no evidence of metastatic disease.  PET scan on 05/20/20201 revealed  markedly hypermetabolic left lower lobe pulmonary lesion (SUV 7.9) c/w the patient's known malignancy. There was associated hypermetabolic nodal metastases in the mediastinum and left hilum with metastatic involvement of left pleural space. Additional sites included upper abdominal hypermetabolic lymphadenopathy and scattered hypermetabolic bone metastases (left acetabulum, sacrum, and L5). There was interval decrease in left pleural effusion with areas of loculation.  CEA was 3.7 on 09/02/2019.  LDH was 206 on 09/23/2019.  He is day 11 s/p cycle #2 carboplatin, etoposide, and Tecentriq (09/02/2019- 09/23/2019) with Margarette Canada support  He has hyponatremiasecondary to SIADH.  He began sodium chloride tablets (1 gm TID) on 09/04/2019.  He has a significant family history of malignancy. A family member has had genetic testing (negative for Lynch syndrome).  Symptomatically, his energy level is decreased.  He is lightheaded when he stands up.  He denies increased shortness of breath or cough.  Exam is stable.  Hematocrit 28.0, hemoglobin 10.0, platelets 58,000, WBC 2900 (ANC 700).  Plan: 1.   Labs today:  CBC with diff, BMP. 2.   Metastatic small cell lung cancer Clinical stage T3N2M1 (stage IV). Pleural fluid cytology confirmed small cell lung cancer. Head MRIwas negative forCNS metastasis. He is currently day 10 s/p cycle #1 carboplatin, etoposide and Tecentriq.   He tolerated his chemotherapy well.              Discuss myelosuppression despite Udencya support.  Discuss plans for restaging studies with recovery  from cycle #2   Review neutropenic precautions. 3.Left pleural effusion             Patient is s/p thoracentesis x 2 followed by pig tail drainage in hospital.             Exam reveals no increase in pleural fluid.  Continue to monitor. 4.   Hyponatremia Sodium 128 on salt tablets 1 gm po TID. Etiology  felt secondary to SIADH.             Continue free water restriction and encourage fluids with electrolytes.             Continue salt tablets. 5. Cancer-related pain             Remains well controlled on Lortab 5/325 prn  Patient declines Xgeva for bone metastasis today. 6.   History of atrial fibrillation             Patient followed by Dr Ubaldo Glassing and is on Eliquis.             Platelet count has decreased from 316,000 to 58,000.  Discuss need to maintain platelets > 50,000 on anticoagulation. 7. Leukopenia and thrombocytopenia  WBC 2900 (ANC 700) and platelets 58,000.  Patient denies any bleeding.  Discuss neutropenic precautions.  Discuss cautiously continuing Eliquis with repeat counts tomorrow. 8.   Chemotherapy induced anemia  Preauth with Retacrit 9.   Dehydration  IVF 500 cc/hr x 2 hours.  Repeat orthostatics after fluids 10.   RTC tomorrow for labs (CBC with diff) and +/- IVF if patient is orthostatic. 11.   RTC on 10/15/2019 for MD assessment, labs (CBC with diff, CMP, Mg), review of CT scans and cycle #3 carboplatin, etoposide and atezolizumide.  I discussed the assessment and treatment plan with the patient.  The patient was provided an opportunity to ask questions and all were answered.  The patient agreed with the plan and demonstrated an understanding of the instructions.  The patient was advised to call back if the symptoms worsen or if the condition fails to improve as anticipated.   Lequita Asal, MD, PhD    10/03/2019, 2:51 PM  I, Mirian Mo Tufford, am acting as Education administrator for [employer]. Mike Gip, MD, PhD.  I, Abubakr Wieman C. Mike Gip, MD, have reviewed the above documentation for accuracy and completeness, and I agree with the above.

## 2019-10-03 ENCOUNTER — Encounter: Payer: Self-pay | Admitting: Hematology and Oncology

## 2019-10-03 ENCOUNTER — Inpatient Hospital Stay (HOSPITAL_BASED_OUTPATIENT_CLINIC_OR_DEPARTMENT_OTHER): Payer: Medicare Other | Admitting: Hematology and Oncology

## 2019-10-03 ENCOUNTER — Other Ambulatory Visit: Payer: Self-pay

## 2019-10-03 ENCOUNTER — Inpatient Hospital Stay: Payer: Medicare Other

## 2019-10-03 VITALS — BP 107/72 | HR 90 | Temp 97.4°F | Wt 135.1 lb

## 2019-10-03 VITALS — BP 126/80 | HR 69 | Temp 98.1°F | Resp 19

## 2019-10-03 DIAGNOSIS — E860 Dehydration: Secondary | ICD-10-CM | POA: Diagnosis not present

## 2019-10-03 DIAGNOSIS — E222 Syndrome of inappropriate secretion of antidiuretic hormone: Secondary | ICD-10-CM | POA: Diagnosis not present

## 2019-10-03 DIAGNOSIS — G893 Neoplasm related pain (acute) (chronic): Secondary | ICD-10-CM | POA: Diagnosis not present

## 2019-10-03 DIAGNOSIS — C3432 Malignant neoplasm of lower lobe, left bronchus or lung: Secondary | ICD-10-CM | POA: Diagnosis not present

## 2019-10-03 DIAGNOSIS — D701 Agranulocytosis secondary to cancer chemotherapy: Secondary | ICD-10-CM | POA: Diagnosis not present

## 2019-10-03 DIAGNOSIS — D6481 Anemia due to antineoplastic chemotherapy: Secondary | ICD-10-CM | POA: Diagnosis not present

## 2019-10-03 DIAGNOSIS — Z5111 Encounter for antineoplastic chemotherapy: Secondary | ICD-10-CM | POA: Diagnosis not present

## 2019-10-03 DIAGNOSIS — E871 Hypo-osmolality and hyponatremia: Secondary | ICD-10-CM

## 2019-10-03 DIAGNOSIS — C781 Secondary malignant neoplasm of mediastinum: Secondary | ICD-10-CM | POA: Diagnosis not present

## 2019-10-03 DIAGNOSIS — Z7901 Long term (current) use of anticoagulants: Secondary | ICD-10-CM | POA: Diagnosis not present

## 2019-10-03 DIAGNOSIS — Z5112 Encounter for antineoplastic immunotherapy: Secondary | ICD-10-CM | POA: Diagnosis not present

## 2019-10-03 DIAGNOSIS — C7951 Secondary malignant neoplasm of bone: Secondary | ICD-10-CM

## 2019-10-03 DIAGNOSIS — I4891 Unspecified atrial fibrillation: Secondary | ICD-10-CM | POA: Diagnosis not present

## 2019-10-03 DIAGNOSIS — I10 Essential (primary) hypertension: Secondary | ICD-10-CM | POA: Diagnosis not present

## 2019-10-03 DIAGNOSIS — C3490 Malignant neoplasm of unspecified part of unspecified bronchus or lung: Secondary | ICD-10-CM

## 2019-10-03 DIAGNOSIS — Z809 Family history of malignant neoplasm, unspecified: Secondary | ICD-10-CM | POA: Diagnosis not present

## 2019-10-03 DIAGNOSIS — Z7189 Other specified counseling: Secondary | ICD-10-CM

## 2019-10-03 DIAGNOSIS — T451X5A Adverse effect of antineoplastic and immunosuppressive drugs, initial encounter: Secondary | ICD-10-CM

## 2019-10-03 DIAGNOSIS — R110 Nausea: Secondary | ICD-10-CM | POA: Diagnosis not present

## 2019-10-03 DIAGNOSIS — J90 Pleural effusion, not elsewhere classified: Secondary | ICD-10-CM | POA: Diagnosis not present

## 2019-10-03 DIAGNOSIS — D696 Thrombocytopenia, unspecified: Secondary | ICD-10-CM | POA: Diagnosis not present

## 2019-10-03 DIAGNOSIS — Z79899 Other long term (current) drug therapy: Secondary | ICD-10-CM | POA: Diagnosis not present

## 2019-10-03 DIAGNOSIS — Z5189 Encounter for other specified aftercare: Secondary | ICD-10-CM | POA: Diagnosis not present

## 2019-10-03 LAB — BASIC METABOLIC PANEL
Anion gap: 9 (ref 5–15)
BUN: 7 mg/dL — ABNORMAL LOW (ref 8–23)
CO2: 25 mmol/L (ref 22–32)
Calcium: 8.8 mg/dL — ABNORMAL LOW (ref 8.9–10.3)
Chloride: 94 mmol/L — ABNORMAL LOW (ref 98–111)
Creatinine, Ser: 0.52 mg/dL — ABNORMAL LOW (ref 0.61–1.24)
GFR calc Af Amer: >60 mL/min (ref >60)
GFR calc non Af Amer: >60 mL/min (ref >60)
Glucose, Bld: 104 mg/dL — ABNORMAL HIGH (ref 70–99)
Potassium: 3.4 mmol/L — ABNORMAL LOW (ref 3.5–5.1)
Sodium: 128 mmol/L — ABNORMAL LOW (ref 135–145)

## 2019-10-03 LAB — CBC WITH DIFFERENTIAL/PLATELET
Abs Immature Granulocytes: 0 10*3/uL (ref 0.00–0.07)
Band Neutrophils: 3 %
Basophils Absolute: 0.1 10*3/uL (ref 0.0–0.1)
Basophils Relative: 3 %
Eosinophils Absolute: 0 10*3/uL (ref 0.0–0.5)
Eosinophils Relative: 0 %
HCT: 28 % — ABNORMAL LOW (ref 39.0–52.0)
Hemoglobin: 10 g/dL — ABNORMAL LOW (ref 13.0–17.0)
Lymphocytes Relative: 59 %
Lymphs Abs: 1.7 10*3/uL (ref 0.7–4.0)
MCH: 33.2 pg (ref 26.0–34.0)
MCHC: 35.7 g/dL (ref 30.0–36.0)
MCV: 93 fL (ref 80.0–100.0)
Metamyelocytes Relative: 1 %
Monocytes Absolute: 0.4 10*3/uL (ref 0.1–1.0)
Monocytes Relative: 13 %
Neutro Abs: 0.7 10*3/uL — ABNORMAL LOW (ref 1.7–7.7)
Neutrophils Relative %: 21 %
Platelets: 58 10*3/uL — ABNORMAL LOW (ref 150–400)
RBC Morphology: NONE SEEN
RBC: 3.01 MIL/uL — ABNORMAL LOW (ref 4.22–5.81)
RDW: 13 % (ref 11.5–15.5)
WBC Morphology: ABNORMAL
WBC: 2.9 10*3/uL — ABNORMAL LOW (ref 4.0–10.5)
nRBC: 0 % (ref 0.0–0.2)

## 2019-10-03 MED ORDER — HEPARIN SOD (PORK) LOCK FLUSH 100 UNIT/ML IV SOLN
500.0000 [IU] | Freq: Once | INTRAVENOUS | Status: AC
  Administered 2019-10-03: 500 [IU] via INTRAVENOUS
  Filled 2019-10-03: qty 5

## 2019-10-03 MED ORDER — SODIUM CHLORIDE 0.9 % IV SOLN
Freq: Once | INTRAVENOUS | Status: AC
  Filled 2019-10-03: qty 250

## 2019-10-03 NOTE — Patient Instructions (Signed)
  Neutropenic precautions.    Patient to contact clinic immediately if fever, chills or not feeling well.

## 2019-10-03 NOTE — Progress Notes (Signed)
The patient c/o dizziness orthostatics has been done.

## 2019-10-10 DIAGNOSIS — T451X5A Adverse effect of antineoplastic and immunosuppressive drugs, initial encounter: Secondary | ICD-10-CM | POA: Insufficient documentation

## 2019-10-10 DIAGNOSIS — D701 Agranulocytosis secondary to cancer chemotherapy: Secondary | ICD-10-CM | POA: Insufficient documentation

## 2019-10-11 ENCOUNTER — Other Ambulatory Visit: Payer: Self-pay

## 2019-10-11 ENCOUNTER — Ambulatory Visit
Admission: RE | Admit: 2019-10-11 | Discharge: 2019-10-11 | Disposition: A | Discharge status: Home / Self Care | Payer: Medicare Other | Source: Ambulatory Visit | Attending: Hematology and Oncology | Admitting: Hematology and Oncology

## 2019-10-11 DIAGNOSIS — J432 Centrilobular emphysema: Secondary | ICD-10-CM | POA: Diagnosis not present

## 2019-10-11 DIAGNOSIS — C3490 Malignant neoplasm of unspecified part of unspecified bronchus or lung: Secondary | ICD-10-CM | POA: Insufficient documentation

## 2019-10-11 DIAGNOSIS — I700 Atherosclerosis of aorta: Secondary | ICD-10-CM | POA: Diagnosis not present

## 2019-10-11 DIAGNOSIS — C3432 Malignant neoplasm of lower lobe, left bronchus or lung: Secondary | ICD-10-CM | POA: Diagnosis not present

## 2019-10-11 DIAGNOSIS — M47816 Spondylosis without myelopathy or radiculopathy, lumbar region: Secondary | ICD-10-CM | POA: Diagnosis not present

## 2019-10-11 DIAGNOSIS — N281 Cyst of kidney, acquired: Secondary | ICD-10-CM | POA: Diagnosis not present

## 2019-10-11 MED ORDER — IOHEXOL 300 MG/ML  SOLN
100.0000 mL | Freq: Once | INTRAMUSCULAR | Status: AC | PRN
  Administered 2019-10-11: 100 mL via INTRAVENOUS

## 2019-10-13 ENCOUNTER — Other Ambulatory Visit: Payer: Self-pay | Admitting: Hematology and Oncology

## 2019-10-13 DIAGNOSIS — E860 Dehydration: Secondary | ICD-10-CM | POA: Insufficient documentation

## 2019-10-13 DIAGNOSIS — D6481 Anemia due to antineoplastic chemotherapy: Secondary | ICD-10-CM | POA: Insufficient documentation

## 2019-10-13 DIAGNOSIS — D696 Thrombocytopenia, unspecified: Secondary | ICD-10-CM | POA: Insufficient documentation

## 2019-10-15 NOTE — Progress Notes (Signed)
Patient denied any problems or concerns. °

## 2019-10-16 ENCOUNTER — Inpatient Hospital Stay: Payer: Medicare Other | Attending: Hematology and Oncology | Admitting: Hematology and Oncology

## 2019-10-16 ENCOUNTER — Encounter: Payer: Self-pay | Admitting: Hematology and Oncology

## 2019-10-16 ENCOUNTER — Inpatient Hospital Stay: Payer: Medicare Other

## 2019-10-16 ENCOUNTER — Other Ambulatory Visit: Payer: Self-pay

## 2019-10-16 VITALS — BP 152/89 | HR 63 | Temp 97.0°F | Resp 18 | Ht 74.0 in | Wt 133.7 lb

## 2019-10-16 DIAGNOSIS — C7951 Secondary malignant neoplasm of bone: Secondary | ICD-10-CM | POA: Insufficient documentation

## 2019-10-16 DIAGNOSIS — Z5189 Encounter for other specified aftercare: Secondary | ICD-10-CM | POA: Insufficient documentation

## 2019-10-16 DIAGNOSIS — I4891 Unspecified atrial fibrillation: Secondary | ICD-10-CM | POA: Insufficient documentation

## 2019-10-16 DIAGNOSIS — C3432 Malignant neoplasm of lower lobe, left bronchus or lung: Secondary | ICD-10-CM | POA: Diagnosis not present

## 2019-10-16 DIAGNOSIS — T451X5A Adverse effect of antineoplastic and immunosuppressive drugs, initial encounter: Secondary | ICD-10-CM | POA: Diagnosis not present

## 2019-10-16 DIAGNOSIS — Z5111 Encounter for antineoplastic chemotherapy: Secondary | ICD-10-CM | POA: Insufficient documentation

## 2019-10-16 DIAGNOSIS — D6959 Other secondary thrombocytopenia: Secondary | ICD-10-CM | POA: Diagnosis not present

## 2019-10-16 DIAGNOSIS — I10 Essential (primary) hypertension: Secondary | ICD-10-CM | POA: Diagnosis not present

## 2019-10-16 DIAGNOSIS — C3490 Malignant neoplasm of unspecified part of unspecified bronchus or lung: Secondary | ICD-10-CM | POA: Diagnosis not present

## 2019-10-16 DIAGNOSIS — D701 Agranulocytosis secondary to cancer chemotherapy: Secondary | ICD-10-CM | POA: Insufficient documentation

## 2019-10-16 DIAGNOSIS — E871 Hypo-osmolality and hyponatremia: Secondary | ICD-10-CM

## 2019-10-16 DIAGNOSIS — D6481 Anemia due to antineoplastic chemotherapy: Secondary | ICD-10-CM | POA: Insufficient documentation

## 2019-10-16 DIAGNOSIS — Z79899 Other long term (current) drug therapy: Secondary | ICD-10-CM | POA: Diagnosis not present

## 2019-10-16 DIAGNOSIS — Z7901 Long term (current) use of anticoagulants: Secondary | ICD-10-CM | POA: Diagnosis not present

## 2019-10-16 DIAGNOSIS — Z5112 Encounter for antineoplastic immunotherapy: Secondary | ICD-10-CM | POA: Diagnosis not present

## 2019-10-16 DIAGNOSIS — G893 Neoplasm related pain (acute) (chronic): Secondary | ICD-10-CM | POA: Insufficient documentation

## 2019-10-16 DIAGNOSIS — F17210 Nicotine dependence, cigarettes, uncomplicated: Secondary | ICD-10-CM | POA: Diagnosis not present

## 2019-10-16 DIAGNOSIS — C781 Secondary malignant neoplasm of mediastinum: Secondary | ICD-10-CM | POA: Diagnosis not present

## 2019-10-16 LAB — COMPREHENSIVE METABOLIC PANEL
ALT: 12 U/L (ref 0–44)
AST: 14 U/L — ABNORMAL LOW (ref 15–41)
Albumin: 3.4 g/dL — ABNORMAL LOW (ref 3.5–5.0)
Alkaline Phosphatase: 72 U/L (ref 38–126)
Anion gap: 10 (ref 5–15)
BUN: 6 mg/dL — ABNORMAL LOW (ref 8–23)
CO2: 24 mmol/L (ref 22–32)
Calcium: 9.1 mg/dL (ref 8.9–10.3)
Chloride: 97 mmol/L — ABNORMAL LOW (ref 98–111)
Creatinine, Ser: 0.54 mg/dL — ABNORMAL LOW (ref 0.61–1.24)
GFR calc Af Amer: >60 mL/min (ref >60)
GFR calc non Af Amer: >60 mL/min (ref >60)
Glucose, Bld: 105 mg/dL — ABNORMAL HIGH (ref 70–99)
Potassium: 4 mmol/L (ref 3.5–5.1)
Sodium: 131 mmol/L — ABNORMAL LOW (ref 135–145)
Total Bilirubin: 0.5 mg/dL (ref 0.3–1.2)
Total Protein: 6.9 g/dL (ref 6.5–8.1)

## 2019-10-16 LAB — CBC WITH DIFFERENTIAL/PLATELET
Abs Immature Granulocytes: 0.65 10*3/uL — ABNORMAL HIGH (ref 0.00–0.07)
Basophils Absolute: 0.1 10*3/uL (ref 0.0–0.1)
Basophils Relative: 1 %
Eosinophils Absolute: 0 10*3/uL (ref 0.0–0.5)
Eosinophils Relative: 0 %
HCT: 28.6 % — ABNORMAL LOW (ref 39.0–52.0)
Hemoglobin: 10.2 g/dL — ABNORMAL LOW (ref 13.0–17.0)
Immature Granulocytes: 5 %
Lymphocytes Relative: 10 %
Lymphs Abs: 1.3 10*3/uL (ref 0.7–4.0)
MCH: 34.6 pg — ABNORMAL HIGH (ref 26.0–34.0)
MCHC: 35.7 g/dL (ref 30.0–36.0)
MCV: 96.9 fL (ref 80.0–100.0)
Monocytes Absolute: 1.5 10*3/uL — ABNORMAL HIGH (ref 0.1–1.0)
Monocytes Relative: 11 %
Neutro Abs: 9.9 10*3/uL — ABNORMAL HIGH (ref 1.7–7.7)
Neutrophils Relative %: 73 %
Platelets: 335 10*3/uL (ref 150–400)
RBC: 2.95 MIL/uL — ABNORMAL LOW (ref 4.22–5.81)
RDW: 16.5 % — ABNORMAL HIGH (ref 11.5–15.5)
WBC: 13.4 10*3/uL — ABNORMAL HIGH (ref 4.0–10.5)
nRBC: 0 % (ref 0.0–0.2)

## 2019-10-16 LAB — LACTATE DEHYDROGENASE: LDH: 144 U/L (ref 98–192)

## 2019-10-16 LAB — MAGNESIUM: Magnesium: 1.5 mg/dL — ABNORMAL LOW (ref 1.7–2.4)

## 2019-10-16 MED ORDER — MAGNESIUM SULFATE 2 GM/50ML IV SOLN
2.0000 g | Freq: Once | INTRAVENOUS | Status: AC
  Administered 2019-10-16: 2 g via INTRAVENOUS

## 2019-10-16 MED ORDER — HEPARIN SOD (PORK) LOCK FLUSH 100 UNIT/ML IV SOLN
500.0000 [IU] | Freq: Once | INTRAVENOUS | Status: AC
  Administered 2019-10-16: 500 [IU] via INTRAVENOUS
  Filled 2019-10-16: qty 5

## 2019-10-16 MED ORDER — SODIUM CHLORIDE 0.9 % IV SOLN
Freq: Once | INTRAVENOUS | Status: AC
  Filled 2019-10-16: qty 250

## 2019-10-16 MED ORDER — MAGNESIUM SULFATE 2 GM/50ML IV SOLN
INTRAVENOUS | Status: AC
  Filled 2019-10-16: qty 50

## 2019-10-16 NOTE — Progress Notes (Signed)
[facility]  [address] [place], [place] [address] Phone: [phone]  Fax: [phone]   Clinic Day:  10/16/2019  Referring physician: No ref. provider found  Chief Complaint: Scott Jordan is a 69 y.o. male with extensive stage small cell lung cancer who is seen for assessment prior to cycle #3 carboplatin, etoposide and atezolizumide.  HPI: The patient was last seen in the medical oncology clinic on 10/03/2019. At that time, he was day 10 of cycle #2 carboplatin, etoposide, and Tecentriq.  His energy level was decreased post chemotherapy.  He was lightheaded when he stood up.  He denied increased shortness of breath or cough.  Exam was stable. Hematocrit was 28.0, hemoglobin 10.0, MCV 93.0, platelets 58,000, WBC 2900 (ANC 700). Sodium was 128. Potassium was 3.4.  He continued salt tablets.  He received IVF.  Chest, abdomen, and pelvis CT on 10/11/2019 revealed considerable reduction in size of prior hypermetabolic lymph nodes in the chest and abdomen, compatible with response to therapy. There was notably reduced size of the left pleural effusion although some complexity or loculation persists. The previous areas of hypermetabolic nodularity along the pleural surface were not obviously nodular along the pleural surface. There was considerable reduction in size of the previously consolidated/collapsed left lower lobe, although there was still some residual indistinct opacity in this region. The previous hypermetabolic osseous metastatic lesions were relatively occult.  There was mild airway thickening was present, suggesting bronchitis or reactive airways disease. There was trace pelvic ascites, improved from prior. There was chronic deformity of the left medial clavicle likely due to an old fracture with some deformity of the left sternoclavicular joint.   Symptomatically, he feels better since starting treatment.  However, he reports that he has no energy. He  is still coughing up a thick white sputum. The patient is in a wheelchair today because he gets lightheaded if he walks too far. He is eating and drinking well and drinks 2 Boosts per day. He has not passed out or had any falls. He is currently on Eliquis.  He takes calcium and potassium.  He does not have any plans to undergo dental work so he would like to hold off on [place].  Chemotherapy is postponed today as his regimen is 3 days followed by Margarette Canada on day 4.  Day 4 falls on a Saturday (09/19/2019); he is not approved for OnPro Neulasta.   Past Medical History:  Diagnosis Date  . A-fib ([place]) 01/10/2019   pt st this was dx by Dr. Ubaldo Glassing  . Cancer ([place]) 2021   LUNG  . Complication of anesthesia    DIFFICULTY WAKING UP AFTER SURGERY- 20 YRS AGO  . Hypertension   . Lung cancer ([place])   . Substance abuse [facility])     Past Surgical History:  Procedure Laterality Date  . ARM DEBRIDEMENT Left    INCISION AND DEBRIDEMENT LOWER ARM -20 YRS AGO  . BACK SURGERY    . PORTACATH PLACEMENT Left 08/29/2019   Procedure: INSERTION PORT-A-CATH;  Surgeon: Nestor Lewandowsky, MD;  Location: [facility] ORS;  Service: General;  Laterality: Left;    History reviewed. No pertinent family history.  Social History:  reports that he has quit smoking. His smoking use included cigarettes. He smoked 0.50 packs per day. He has never used smokeless tobacco. He reports current alcohol use. He reports previous drug use. Drug: Heroin. The patient denies any exposure to radiation or toxins. The patient lives in [place]. He  has family friends staying with him to help care for his needs.His niece, [name] [phone]), is his medical power of attorney. The patient is alone today.  Allergies: No Known Allergies  Current Medications: Current Outpatient Medications  Medication Sig Dispense Refill  . diltiazem (CARDIZEM CD) 240 MG 24 hr capsule Take 1 capsule (240 mg total) by mouth daily. 30 capsule 2  . ELIQUIS 5 MG TABS  tablet Take 5 mg by mouth 2 (two) times daily.    Marland Kitchen escitalopram (LEXAPRO) 10 MG tablet Take 1 tablet (10 mg total) by mouth daily. 90 tablet 0  . feeding supplement, ENSURE ENLIVE, (ENSURE ENLIVE) LIQD Take 237 mLs by mouth 3 (three) times daily between meals. 237 mL 12  . furosemide (LASIX) 20 MG tablet Take 1 tablet (20 mg total) by mouth daily. 20 tablet 1  . HYDROcodone-acetaminophen (NORCO/VICODIN) 5-325 MG tablet Take 1 tablet by mouth every 6 (six) hours as needed for moderate pain. 30 tablet 0  . losartan (COZAAR) 25 MG tablet Take 1 tablet (25 mg total) by mouth daily. 30 tablet 2  . sodium chloride 1 g tablet Take 1 tablet (1 g total) by mouth 3 (three) times daily with meals. 90 tablet 1  . lidocaine-prilocaine (EMLA) cream Apply to affected area once (Patient not taking: Reported on 10/03/2019) 30 g 3  . nystatin (MYCOSTATIN) 100000 UNIT/ML suspension Use as directed 5 mLs (500,000 Units total) in the mouth or throat 4 (four) times daily. Swish and spit (Patient not taking: Reported on 10/03/2019) 60 mL 0  . ondansetron (ZOFRAN) 8 MG tablet Take 1 tablet (8 mg total) by mouth every 8 (eight) hours as needed for refractory nausea / vomiting. Start on day 3 after carboplatin chemo. (Patient not taking: Reported on 10/03/2019) 30 tablet 1   No current facility-administered medications for this visit.   Facility-Administered Medications Ordered in Other Visits  Medication Dose Route Frequency Provider Last Rate Last Admin  . heparin lock flush 100 unit/mL  500 Units Intravenous Once Lequita Asal, MD        Review of Systems  Constitutional: Positive for malaise/fatigue and weight loss (2 lbs). Negative for chills, diaphoresis and fever.       Feels better overall.  HENT: Negative for congestion, ear discharge, ear pain, hearing loss, nosebleeds, sinus pain, sore throat and tinnitus.   Eyes: Negative for blurred vision and double vision.  Respiratory: Positive for sputum production  (thick white sputum). Negative for cough, hemoptysis and shortness of breath.   Cardiovascular: Negative.  Negative for chest pain, palpitations and leg swelling.  Gastrointestinal: Negative for abdominal pain, blood in stool, constipation, diarrhea, heartburn, melena, nausea and vomiting.       Eating and drinking well. Drinks 2 Boosts per day.  Genitourinary: Negative for dysuria, frequency, hematuria and urgency.  Musculoskeletal: Negative.  Negative for back pain, joint pain, myalgias and neck pain.  Skin: Negative.  Negative for itching and rash.  Neurological: Positive for dizziness (if he walks too far). Negative for sensory change, focal weakness, weakness and headaches.  Endo/Heme/Allergies: Negative.  Does not bruise/bleed easily.  Psychiatric/Behavioral: Negative.  Negative for depression and memory loss. The patient is not nervous/anxious and does not have insomnia.   All other systems reviewed and are negative.  Performance status (ECOG): 1  Vitals Blood pressure (!) 155/102, pulse 63, temperature (!) 97 F (36.1 C), temperature source Tympanic, resp. rate 18, height 6\' 2"  (1.88 m), weight 133 lb 11.3 oz (  60.6 kg), SpO2 98 %.   Physical Exam Vitals and nursing note reviewed.  Constitutional:      General: He is not in acute distress.    Appearance: Normal appearance. He is well-developed. He is not diaphoretic.     Interventions: Face mask in place.     Comments: Thin, chronically fatigued appearing gentleman sitting comfortably in wheelchair in no acute distress.   HENT:     Head: Normocephalic and atraumatic.     Comments: Shaved head. Mask. Wearing a scarf.    Left Ear: Hearing normal.     Mouth/Throat:     Mouth: No oral lesions.  Eyes:     General: No scleral icterus.    Extraocular Movements: Extraocular movements intact.     Conjunctiva/sclera: Conjunctivae normal.     Pupils: Pupils are equal, round, and reactive to light.     Comments: Blue eyes.   Neck:      Vascular: No JVD.  Cardiovascular:     Rate and Rhythm: Normal rate and regular rhythm.     Heart sounds: Normal heart sounds. No murmur heard.  No friction rub. No gallop.   Pulmonary:     Effort: Pulmonary effort is normal. No respiratory distress.     Breath sounds: Normal breath sounds. No wheezing or rales.     Comments: Decreased breath sounds right base (stable). Chest:     Chest wall: No tenderness.  Abdominal:     General: Bowel sounds are normal. There is no distension.     Palpations: Abdomen is soft. There is no mass.     Tenderness: There is no abdominal tenderness. There is no guarding or rebound.  Musculoskeletal:        General: No swelling or tenderness. Normal range of motion.     Cervical back: Normal range of motion and neck supple.  Lymphadenopathy:     Head:     Right side of head: No preauricular, posterior auricular or occipital adenopathy.     Left side of head: No preauricular, posterior auricular or occipital adenopathy.     Cervical: No cervical adenopathy.     Upper Body:     Right upper body: No supraclavicular or axillary adenopathy.     Left upper body: No supraclavicular or axillary adenopathy.     Lower Body: No right inguinal adenopathy. No left inguinal adenopathy.  Skin:    General: Skin is warm and dry.     Coloration: Skin is not pale.     Findings: No bruising, erythema, lesion or rash.  Neurological:     Mental Status: He is alert and oriented to person, place, and time.  Psychiatric:        Behavior: Behavior normal.        Thought Content: Thought content normal.        Judgment: Judgment normal.    Appointment on 10/16/2019  Component Date Value Ref Range Status  . LDH 10/16/2019 144  98 - 192 U/L Final   Performed at [facility], [address] [place], [place] [address]  . Magnesium 10/16/2019 1.5* 1.7 - 2.4 mg/dL Final   Performed at [facility], [address], [place], [place] [address]  .  Sodium 10/16/2019 131* 135 - 145 mmol/L Final  . Potassium 10/16/2019 4.0  3.5 - 5.1 mmol/L Final  . Chloride 10/16/2019 97* 98 - 111 mmol/L Final  . CO2 10/16/2019 24  22 - 32 mmol/L Final  .  Glucose, Bld 10/16/2019 105* 70 - 99 mg/dL Final   Glucose reference range applies only to samples taken after fasting for at least 8 hours.  . BUN 10/16/2019 6* 8 - 23 mg/dL Final  . Creatinine, Ser 10/16/2019 0.54* 0.61 - 1.24 mg/dL Final  . Calcium 10/16/2019 9.1  8.9 - 10.3 mg/dL Final  . Total Protein 10/16/2019 6.9  6.5 - 8.1 g/dL Final  . Albumin 10/16/2019 3.4* 3.5 - 5.0 g/dL Final  . AST 10/16/2019 14* 15 - 41 U/L Final  . ALT 10/16/2019 12  0 - 44 U/L Final  . Alkaline Phosphatase 10/16/2019 72  38 - 126 U/L Final  . Total Bilirubin 10/16/2019 0.5  0.3 - 1.2 mg/dL Final  . GFR calc non Af Amer 10/16/2019 >60  >60 mL/min Final  . GFR calc Af Amer 10/16/2019 >60  >60 mL/min Final  . Anion gap 10/16/2019 10  5 - 15 Final   Performed at [facility], [address], [place], [place] [address]  . WBC 10/16/2019 13.4* 4.0 - 10.5 K/uL Final  . RBC 10/16/2019 2.95* 4.22 - 5.81 MIL/uL Final  . Hemoglobin 10/16/2019 10.2* 13.0 - 17.0 g/dL Final  . HCT 10/16/2019 28.6* 39 - 52 % Final  . MCV 10/16/2019 96.9  80.0 - 100.0 fL Final  . MCH 10/16/2019 34.6* 26.0 - 34.0 pg Final  . MCHC 10/16/2019 35.7  30.0 - 36.0 g/dL Final  . RDW 10/16/2019 16.5* 11.5 - 15.5 % Final  . Platelets 10/16/2019 335  150 - 400 K/uL Final  . nRBC 10/16/2019 0.0  0.0 - 0.2 % Final  . Neutrophils Relative % 10/16/2019 73  % Final  . Neutro Abs 10/16/2019 9.9* 1.7 - 7.7 K/uL Final  . Lymphocytes Relative 10/16/2019 10  % Final  . Lymphs Abs 10/16/2019 1.3  0.7 - 4.0 K/uL Final  . Monocytes Relative 10/16/2019 11  % Final  . Monocytes Absolute 10/16/2019 1.5* 0 - 1 K/uL Final  . Eosinophils Relative 10/16/2019 0  % Final  . Eosinophils Absolute 10/16/2019 0.0  0 - 0 K/uL Final  . Basophils Relative  10/16/2019 1  % Final  . Basophils Absolute 10/16/2019 0.1  0 - 0 K/uL Final  . Immature Granulocytes 10/16/2019 5  % Final  . Abs Immature Granulocytes 10/16/2019 0.65* 0.00 - 0.07 K/uL Final   Performed at [facility], [address] [place], [place] [address]    Assessment:  INOCENCIO ROY is a 69 y.o. male with extensive stage small cell lung cancer. He has a >40 pack year smoking. He presented with a recurrent left sided pleural effusion. He has undergone thoracentesis x 2 in the past month. He is s/p pigtail drain. Initial cytology x 2 was negative (lymphocytic exudative effusion).Pleural fluid cytologyon 08/18/2019 confirmed small cell lung cancer.  Chest CT without contraston 08/19/2019 revealed the left pleural pigtail drain. There was a small residual left hydropneumothorax. There was a 6.7 x 4 cm medial basilar left lower lobe lung masssuspicious for primary bronchogenic carcinoma. There was a lingular 1.0 cm solid pulmonary nodulesuspicious for ipsilateral metastasis. There was intralobular septal thickening and patchy groundglass opacity throughout the lingula and the left lower lobe with moderate residual atelectasis of the left lung base. A component of lymphangitic carcinomatosis was suspected. There was ipsilateral infra hilar, subcarinal, AP window, prevascular and upper retroperitoneal adenopathyc/wmetastatic disease.  Head MRI on 08/21/2019 revealed no evidence of metastatic disease.  PET scan on  05/20/20201 revealed markedly hypermetabolic left lower lobe pulmonary lesion (SUV 7.9) c/w the patient's known malignancy. There was associated hypermetabolic nodal metastases in the mediastinum and left hilum with metastatic involvement of left pleural space. Additional sites included upper abdominal hypermetabolic lymphadenopathy and scattered hypermetabolic bone metastases (left acetabulum, sacrum, and L5). There was interval decrease in left pleural  effusion with areas of loculation.  CEA was 3.7 on 09/02/2019.  LDH was 206 on 09/23/2019.  He is day 24 s/p cycle #2 carboplatin, etoposide, and Tecentriq (09/02/2019- 09/23/2019) with Udencya support  Chest, abdomen, and pelvis CT on 10/11/2019 revealed considerable reduction in size of prior hypermetabolic lymph nodes in the chest and abdomen, compatible with response to therapy. There was notably reduced size of the left pleural effusion although some complexity or loculation persists. The previous areas of hypermetabolic nodularity along the pleural surface were not obviously nodular along the pleural surface. There was considerable reduction in size of the previously consolidated/collapsed left lower lobe, although there was still some residual indistinct opacity in this region. The previous hypermetabolic osseous metastatic lesions were relatively occult.  There was mild airway thickening was present, suggesting bronchitis or reactive airways disease. There was trace pelvic ascites, improved from prior. There was chronic deformity of the left medial clavicle likely due to an old fracture with some deformity of the left sternoclavicular joint.   He has hyponatremiasecondary to SIADH.  He began sodium chloride tablets (1 gm TID) on 09/04/2019.  He has a significant family history of malignancy. A family member has had genetic testing (negative for Lynch syndrome).  Symptomatically, he is doing well.  He feels much better since initiation of chemotherapy.  Energy level remains low.  He has stable decreased breath sounds at the left base.  Plan: 1.   Labs today:  CBC with diff, CMP, Mg 2.   Metastatic small cell lung cancer Clinical stage T3N2M1 (stage IV). Pleural fluid cytology confirmed small cell lung cancer. Head MRIwas negative forCNS metastasis. He is currently day 24 s/p cycle #2 carboplatin, etoposide and Tecentriq.   He is  tolerating chemotherapy well except for fatigue.   He develops neutropenia despite Udencya.   He develops mild thrombocytopenia with chemotherapy.  Review chest, abdomen, and pelvis CT on 10/11/2019.  Images personally reviewed.  Agree with radiology interpretation.   He has had a dramatic response to treatment.   Discuss plans for restaging PET scan after 4 cycles and likely continuation of maintenance Tecentriq.  Discuss postponing chemotherapy today as 4 days needed for treatment with growth factor support.  Discuss symptom management.  He has antiemetics at home to use on a prn bases.  Interventions are adequate.    3.Left pleural effusion             Patient is s/p thoracentesis x 2 followed by pig tail drainage in hospital.             Exam reveals minimal pleural fluid.  Suspect fluid will resolve prior to next imaging. 4.   Hyponatremia Sodium 131 on salt tablets 1 gm po TID. Etiology felt secondary to SIADH.             Continue free water restriction and fluids with electrolytes.              Continue salt tablets. 5. Cancer-related pain             Pain is well controlled with Lortab 5/325 prn  Patient declines Xgeva for bone  metastasis. 6.   History of atrial fibrillation             Patient followed by Dr Ubaldo Glassing and is on Eliquis.             Platelet count decreased from 310,000 to 77,000 with cycle #1.  Platelet count decreased from 316,000 to 58,000 with cycle #2.  Maintain platelets > 50,000 on anticoagulation. 7.   Chemotherapy induced anemia  Hematocrit 28.6.  Hemoglobin 10.2.  MCV 96.9.   Ferritin 910 with an iron saturation 65% and a TIBC 280 on 09/16/2019.   B12 was 673 and folate 8.6 on 09/16/2019.   Preauth Retacrit. 8.   Hypomagnesemia  Magnesium 1.5.  Magnesium 2 gm IV today. 9.   No chemotherapy today. 10.   RTC on 10/21/2019 for NP assessment, labs (CBC with diff, CMP, Mg) and cycle #3 carboplatin, etoposide and Tecentriq.  I  discussed the assessment and treatment plan with the patient.  The patient was provided an opportunity to ask questions and all were answered.  The patient agreed with the plan and demonstrated an understanding of the instructions.  The patient was advised to call back if the symptoms worsen or if the condition fails to improve as anticipated.   Lequita Asal, MD, PhD    10/16/2019, 8:58 AM  I, Mirian Mo Tufford, am acting as Education administrator for [employer]. Mike Gip, MD, PhD.  I, Demetria Iwai C. Mike Gip, MD, have reviewed the above documentation for accuracy and completeness, and I agree with the above.

## 2019-10-16 NOTE — Progress Notes (Signed)
Increase b/p 155/102 right arm

## 2019-10-17 ENCOUNTER — Inpatient Hospital Stay: Payer: Medicare Other

## 2019-10-17 LAB — T4: T4, Total: 5.8 ug/dL (ref 4.5–12.0)

## 2019-10-18 ENCOUNTER — Other Ambulatory Visit: Payer: Self-pay | Admitting: *Deleted

## 2019-10-18 ENCOUNTER — Inpatient Hospital Stay: Payer: Medicare Other

## 2019-10-18 DIAGNOSIS — C3490 Malignant neoplasm of unspecified part of unspecified bronchus or lung: Secondary | ICD-10-CM

## 2019-10-21 ENCOUNTER — Encounter: Payer: Self-pay | Admitting: Nurse Practitioner

## 2019-10-21 ENCOUNTER — Inpatient Hospital Stay: Payer: Medicare Other | Attending: Hematology and Oncology

## 2019-10-21 ENCOUNTER — Other Ambulatory Visit: Payer: Self-pay

## 2019-10-21 ENCOUNTER — Inpatient Hospital Stay: Payer: Medicare Other

## 2019-10-21 ENCOUNTER — Inpatient Hospital Stay (HOSPITAL_BASED_OUTPATIENT_CLINIC_OR_DEPARTMENT_OTHER): Payer: Medicare Other | Admitting: Nurse Practitioner

## 2019-10-21 VITALS — BP 133/91 | HR 100 | Temp 97.9°F | Resp 20

## 2019-10-21 VITALS — BP 114/77 | HR 92 | Temp 97.9°F | Wt 132.2 lb

## 2019-10-21 DIAGNOSIS — Z79899 Other long term (current) drug therapy: Secondary | ICD-10-CM | POA: Diagnosis not present

## 2019-10-21 DIAGNOSIS — Z7901 Long term (current) use of anticoagulants: Secondary | ICD-10-CM | POA: Diagnosis not present

## 2019-10-21 DIAGNOSIS — I4891 Unspecified atrial fibrillation: Secondary | ICD-10-CM | POA: Diagnosis not present

## 2019-10-21 DIAGNOSIS — C781 Secondary malignant neoplasm of mediastinum: Secondary | ICD-10-CM | POA: Diagnosis not present

## 2019-10-21 DIAGNOSIS — E871 Hypo-osmolality and hyponatremia: Secondary | ICD-10-CM | POA: Diagnosis not present

## 2019-10-21 DIAGNOSIS — C3432 Malignant neoplasm of lower lobe, left bronchus or lung: Secondary | ICD-10-CM | POA: Diagnosis not present

## 2019-10-21 DIAGNOSIS — C7951 Secondary malignant neoplasm of bone: Secondary | ICD-10-CM | POA: Diagnosis not present

## 2019-10-21 DIAGNOSIS — T451X5A Adverse effect of antineoplastic and immunosuppressive drugs, initial encounter: Secondary | ICD-10-CM | POA: Diagnosis not present

## 2019-10-21 DIAGNOSIS — Z5111 Encounter for antineoplastic chemotherapy: Secondary | ICD-10-CM | POA: Diagnosis not present

## 2019-10-21 DIAGNOSIS — G893 Neoplasm related pain (acute) (chronic): Secondary | ICD-10-CM | POA: Diagnosis not present

## 2019-10-21 DIAGNOSIS — D6959 Other secondary thrombocytopenia: Secondary | ICD-10-CM | POA: Diagnosis not present

## 2019-10-21 DIAGNOSIS — Z5189 Encounter for other specified aftercare: Secondary | ICD-10-CM | POA: Diagnosis not present

## 2019-10-21 DIAGNOSIS — I10 Essential (primary) hypertension: Secondary | ICD-10-CM | POA: Diagnosis not present

## 2019-10-21 DIAGNOSIS — Z5112 Encounter for antineoplastic immunotherapy: Secondary | ICD-10-CM | POA: Diagnosis not present

## 2019-10-21 DIAGNOSIS — D701 Agranulocytosis secondary to cancer chemotherapy: Secondary | ICD-10-CM | POA: Diagnosis not present

## 2019-10-21 DIAGNOSIS — D6481 Anemia due to antineoplastic chemotherapy: Secondary | ICD-10-CM | POA: Diagnosis not present

## 2019-10-21 DIAGNOSIS — C3490 Malignant neoplasm of unspecified part of unspecified bronchus or lung: Secondary | ICD-10-CM

## 2019-10-21 LAB — CBC WITH DIFFERENTIAL/PLATELET
Abs Immature Granulocytes: 0.91 10*3/uL — ABNORMAL HIGH (ref 0.00–0.07)
Basophils Absolute: 0.1 10*3/uL (ref 0.0–0.1)
Basophils Relative: 1 %
Eosinophils Absolute: 0 10*3/uL (ref 0.0–0.5)
Eosinophils Relative: 0 %
HCT: 28.7 % — ABNORMAL LOW (ref 39.0–52.0)
Hemoglobin: 10.2 g/dL — ABNORMAL LOW (ref 13.0–17.0)
Immature Granulocytes: 8 %
Lymphocytes Relative: 12 %
Lymphs Abs: 1.4 10*3/uL (ref 0.7–4.0)
MCH: 33.8 pg (ref 26.0–34.0)
MCHC: 35.5 g/dL (ref 30.0–36.0)
MCV: 95 fL (ref 80.0–100.0)
Monocytes Absolute: 1.9 10*3/uL — ABNORMAL HIGH (ref 0.1–1.0)
Monocytes Relative: 16 %
Neutro Abs: 7.5 10*3/uL (ref 1.7–7.7)
Neutrophils Relative %: 63 %
Platelets: 255 10*3/uL (ref 150–400)
RBC: 3.02 MIL/uL — ABNORMAL LOW (ref 4.22–5.81)
RDW: 15.4 % (ref 11.5–15.5)
WBC: 11.9 10*3/uL — ABNORMAL HIGH (ref 4.0–10.5)
nRBC: 0 % (ref 0.0–0.2)

## 2019-10-21 LAB — COMPREHENSIVE METABOLIC PANEL
ALT: 11 U/L (ref 0–44)
AST: 15 U/L (ref 15–41)
Albumin: 3 g/dL — ABNORMAL LOW (ref 3.5–5.0)
Alkaline Phosphatase: 74 U/L (ref 38–126)
Anion gap: 9 (ref 5–15)
BUN: 8 mg/dL (ref 8–23)
CO2: 23 mmol/L (ref 22–32)
Calcium: 8.7 mg/dL — ABNORMAL LOW (ref 8.9–10.3)
Chloride: 95 mmol/L — ABNORMAL LOW (ref 98–111)
Creatinine, Ser: 0.55 mg/dL — ABNORMAL LOW (ref 0.61–1.24)
GFR calc Af Amer: >60 mL/min (ref >60)
GFR calc non Af Amer: >60 mL/min (ref >60)
Glucose, Bld: 133 mg/dL — ABNORMAL HIGH (ref 70–99)
Potassium: 3.5 mmol/L (ref 3.5–5.1)
Sodium: 127 mmol/L — ABNORMAL LOW (ref 135–145)
Total Bilirubin: 0.4 mg/dL (ref 0.3–1.2)
Total Protein: 6.9 g/dL (ref 6.5–8.1)

## 2019-10-21 LAB — MAGNESIUM: Magnesium: 1.5 mg/dL — ABNORMAL LOW (ref 1.7–2.4)

## 2019-10-21 MED ORDER — SODIUM CHLORIDE 0.9 % IV SOLN
65.0000 mg/m2 | Freq: Once | INTRAVENOUS | Status: AC
  Administered 2019-10-21: 120 mg via INTRAVENOUS
  Filled 2019-10-21: qty 6

## 2019-10-21 MED ORDER — SODIUM CHLORIDE 0.9 % IV SOLN
1200.0000 mg | Freq: Once | INTRAVENOUS | Status: AC
  Administered 2019-10-21: 1200 mg via INTRAVENOUS
  Filled 2019-10-21: qty 20

## 2019-10-21 MED ORDER — SODIUM CHLORIDE 0.9 % IV SOLN
10.0000 mg | Freq: Once | INTRAVENOUS | Status: AC
  Administered 2019-10-21: 10 mg via INTRAVENOUS
  Filled 2019-10-21: qty 10

## 2019-10-21 MED ORDER — SODIUM CHLORIDE 0.9 % IV SOLN
444.0000 mg | Freq: Once | INTRAVENOUS | Status: AC
  Administered 2019-10-21: 440 mg via INTRAVENOUS
  Filled 2019-10-21: qty 44

## 2019-10-21 MED ORDER — MAGNESIUM SULFATE 2 GM/50ML IV SOLN
2.0000 g | Freq: Once | INTRAVENOUS | Status: AC
  Administered 2019-10-21: 2 g via INTRAVENOUS
  Filled 2019-10-21: qty 50

## 2019-10-21 MED ORDER — HEPARIN SOD (PORK) LOCK FLUSH 100 UNIT/ML IV SOLN
INTRAVENOUS | Status: AC
  Filled 2019-10-21: qty 5

## 2019-10-21 MED ORDER — PALONOSETRON HCL INJECTION 0.25 MG/5ML
0.2500 mg | Freq: Once | INTRAVENOUS | Status: AC
  Administered 2019-10-21: 0.25 mg via INTRAVENOUS
  Filled 2019-10-21: qty 5

## 2019-10-21 MED ORDER — SODIUM CHLORIDE 0.9 % IV SOLN
150.0000 mg | Freq: Once | INTRAVENOUS | Status: AC
  Administered 2019-10-21: 150 mg via INTRAVENOUS
  Filled 2019-10-21: qty 5

## 2019-10-21 MED ORDER — SODIUM CHLORIDE 0.9% FLUSH
10.0000 mL | INTRAVENOUS | Status: DC | PRN
  Filled 2019-10-21: qty 10

## 2019-10-21 MED ORDER — HEPARIN SOD (PORK) LOCK FLUSH 100 UNIT/ML IV SOLN
500.0000 [IU] | Freq: Once | INTRAVENOUS | Status: AC
  Administered 2019-10-21: 500 [IU] via INTRAVENOUS
  Filled 2019-10-21: qty 5

## 2019-10-21 MED ORDER — SODIUM CHLORIDE 0.9 % IV SOLN
Freq: Once | INTRAVENOUS | Status: AC
  Filled 2019-10-21: qty 250

## 2019-10-21 NOTE — Progress Notes (Signed)
No new changes noted today 

## 2019-10-21 NOTE — Progress Notes (Signed)
[facility]  [address], [address] [place], [place] [address] Phone: [phone]  Fax: [phone]   Clinic Day:  10/21/2019  Referring physician: No ref. provider found  Chief Complaint: Scott Jordan is a 69 y.o. male with extensive stage small cell lung cancer who is seen for assessment prior to cycle #3 carboplatin, etoposide and atezolizumide.  HPI: Patient last seen by Dr. Mike Gip on 10/16/2019.  He completed cycle 2 of carbo-etoposide-Tecentriq at that time with Udenyca support.  Has some episodes of lightheadedness.  No increased shortness of breath or chest pain.  Says he tolerates chemo well without significant side effects.  He continues salt tablets.  Endorses alcohol use intermittently.  Is also received some IV fluids in the past.  Continues Lasix and fluid restriction for SIADH.  Has not seen nephrology.  Continues to use a wheelchair for mobilization > 500 feet (approximately). No falls. Drinks Boost.  Continues Eliquis, calcium, potassium.  He requests letter for his attorney today.   Past Medical History:  Diagnosis Date  . A-fib ([place]) 01/10/2019   pt st this was dx by Dr. Ubaldo Glassing  . Cancer ([place]) 2021   LUNG  . Complication of anesthesia    DIFFICULTY WAKING UP AFTER SURGERY- 20 YRS AGO  . Hypertension   . Lung cancer ([place])   . Substance abuse [facility])     Past Surgical History:  Procedure Laterality Date  . ARM DEBRIDEMENT Left    INCISION AND DEBRIDEMENT LOWER ARM -20 YRS AGO  . BACK SURGERY    . PORTACATH PLACEMENT Left 08/29/2019   Procedure: INSERTION PORT-A-CATH;  Surgeon: Nestor Lewandowsky, MD;  Location: [facility] ORS;  Service: General;  Laterality: Left;    No family history on file.  Social History:  reports that he has quit smoking. His smoking use included cigarettes. He smoked 0.50 packs per day. He has never used smokeless tobacco. He reports current alcohol use. He reports previous drug use. Drug: Heroin. The patient denies any  exposure to radiation or toxins. The patient lives in [place]. He has family friends staying with him to help care for his needs.His niece, [name] [phone]), is his medical power of attorney. The patient is alone today.  Allergies: No Known Allergies  Current Medications: Current Outpatient Medications  Medication Sig Dispense Refill  . diltiazem (CARDIZEM CD) 240 MG 24 hr capsule Take 1 capsule (240 mg total) by mouth daily. 30 capsule 2  . ELIQUIS 5 MG TABS tablet Take 5 mg by mouth 2 (two) times daily.    Marland Kitchen escitalopram (LEXAPRO) 10 MG tablet Take 1 tablet (10 mg total) by mouth daily. 90 tablet 0  . feeding supplement, ENSURE ENLIVE, (ENSURE ENLIVE) LIQD Take 237 mLs by mouth 3 (three) times daily between meals. 237 mL 12  . furosemide (LASIX) 20 MG tablet Take 1 tablet (20 mg total) by mouth daily. 20 tablet 1  . HYDROcodone-acetaminophen (NORCO/VICODIN) 5-325 MG tablet Take 1 tablet by mouth every 6 (six) hours as needed for moderate pain. 30 tablet 0  . lidocaine-prilocaine (EMLA) cream Apply to affected area once (Patient not taking: Reported on 10/03/2019) 30 g 3  . losartan (COZAAR) 25 MG tablet Take 1 tablet (25 mg total) by mouth daily. 30 tablet 2  . nystatin (MYCOSTATIN) 100000 UNIT/ML suspension Use as directed 5 mLs (500,000 Units total) in the mouth or throat 4 (four) times daily. Swish and spit (Patient not taking: Reported on 10/03/2019) 60 mL 0  .  ondansetron (ZOFRAN) 8 MG tablet Take 1 tablet (8 mg total) by mouth every 8 (eight) hours as needed for refractory nausea / vomiting. Start on day 3 after carboplatin chemo. (Patient not taking: Reported on 10/03/2019) 30 tablet 1  . sodium chloride 1 g tablet Take 1 tablet (1 g total) by mouth 3 (three) times daily with meals. 90 tablet 1   No current facility-administered medications for this visit.   Facility-Administered Medications Ordered in Other Visits  Medication Dose Route Frequency Provider Last Rate Last  Admin  . heparin lock flush 100 unit/mL  500 Units Intravenous Once Corcoran, Melissa C, MD      . heparin lock flush 100 unit/mL  500 Units Intravenous Once Corcoran, Melissa C, MD      . sodium chloride flush (NS) 0.9 % injection 10 mL  10 mL Intravenous PRN Lequita Asal, MD        Review of Systems  Constitutional: Positive for malaise/fatigue and weight loss (1 lb). Negative for chills, diaphoresis and fever.       Feels better overall.  HENT: Negative for congestion, ear discharge, ear pain, hearing loss, nosebleeds, sinus pain, sore throat and tinnitus.   Eyes: Negative for blurred vision and double vision.  Respiratory: Positive for sputum production (thick white sputum). Negative for cough, hemoptysis and shortness of breath.   Cardiovascular: Negative.  Negative for chest pain, palpitations and leg swelling.  Gastrointestinal: Negative for abdominal pain, blood in stool, constipation, diarrhea, heartburn, melena, nausea and vomiting.       Eating and drinking well. Drinks 2 Boosts per day.  Genitourinary: Negative for dysuria, frequency, hematuria and urgency.  Musculoskeletal: Negative.  Negative for back pain, joint pain, myalgias and neck pain.       Wheelchair  Skin: Negative.  Negative for itching and rash.  Neurological: Positive for dizziness (intermittent) and weakness. Negative for sensory change, focal weakness and headaches.  Endo/Heme/Allergies: Negative.  Does not bruise/bleed easily.  Psychiatric/Behavioral: Negative.  Negative for depression and memory loss. The patient is not nervous/anxious and does not have insomnia.   All other systems reviewed and are negative.  Performance status (ECOG): 1  Vitals Blood pressure 114/77, pulse 92, temperature 97.9 F (36.6 C), temperature source Tympanic, weight 132 lb 2.7 oz (59.9 kg), SpO2 97 %.   Physical Exam Vitals and nursing note reviewed.  Constitutional:      General: He is not in acute distress.     Appearance: Normal appearance. He is well-developed. He is not diaphoretic.     Interventions: Face mask in place.     Comments: Thin build  HENT:     Head: Normocephalic and atraumatic.     Comments: Shaved head. Mask.     Left Ear: Hearing normal.     Mouth/Throat:     Mouth: No oral lesions.  Eyes:     General: No scleral icterus.    Extraocular Movements: Extraocular movements intact.     Conjunctiva/sclera: Conjunctivae normal.     Comments: Blue eyes.   Neck:     Vascular: No JVD.  Cardiovascular:     Rate and Rhythm: Normal rate and regular rhythm.     Heart sounds: Normal heart sounds. No murmur heard.  No friction rub. No gallop.   Pulmonary:     Effort: Pulmonary effort is normal. No respiratory distress.     Breath sounds: Normal breath sounds. No wheezing or rales.     Comments: Decreased breath sounds  right base (stable). Chest:     Chest wall: No tenderness.  Abdominal:     General: Bowel sounds are normal. There is no distension.     Palpations: Abdomen is soft. There is no mass.     Tenderness: There is no abdominal tenderness. There is no guarding or rebound.  Musculoskeletal:        General: No swelling or tenderness. Normal range of motion.     Cervical back: Normal range of motion and neck supple.  Lymphadenopathy:     Head:     Right side of head: No preauricular, posterior auricular or occipital adenopathy.     Left side of head: No preauricular, posterior auricular or occipital adenopathy.     Cervical: No cervical adenopathy.     Upper Body:     Right upper body: No supraclavicular or axillary adenopathy.     Left upper body: No supraclavicular or axillary adenopathy.     Lower Body: No right inguinal adenopathy. No left inguinal adenopathy.  Skin:    General: Skin is warm and dry.     Coloration: Skin is not pale.     Findings: No bruising, erythema, lesion or rash.  Neurological:     Mental Status: He is alert and oriented to person, place, and  time.  Psychiatric:        Behavior: Behavior normal.        Thought Content: Thought content normal.        Judgment: Judgment normal.    Infusion on 10/21/2019  Component Date Value Ref Range Status  . Magnesium 10/21/2019 1.5* 1.7 - 2.4 mg/dL Final   Performed at [facility], [address], [place], [place] [address]  . Sodium 10/21/2019 127* 135 - 145 mmol/L Final  . Potassium 10/21/2019 3.5  3.5 - 5.1 mmol/L Final  . Chloride 10/21/2019 95* 98 - 111 mmol/L Final  . CO2 10/21/2019 23  22 - 32 mmol/L Final  . Glucose, Bld 10/21/2019 133* 70 - 99 mg/dL Final   Glucose reference range applies only to samples taken after fasting for at least 8 hours.  . BUN 10/21/2019 8  8 - 23 mg/dL Final  . Creatinine, Ser 10/21/2019 0.55* 0.61 - 1.24 mg/dL Final  . Calcium 10/21/2019 8.7* 8.9 - 10.3 mg/dL Final  . Total Protein 10/21/2019 6.9  6.5 - 8.1 g/dL Final  . Albumin 10/21/2019 3.0* 3.5 - 5.0 g/dL Final  . AST 10/21/2019 15  15 - 41 U/L Final  . ALT 10/21/2019 11  0 - 44 U/L Final  . Alkaline Phosphatase 10/21/2019 74  38 - 126 U/L Final  . Total Bilirubin 10/21/2019 0.4  0.3 - 1.2 mg/dL Final  . GFR calc non Af Amer 10/21/2019 >60  >60 mL/min Final  . GFR calc Af Amer 10/21/2019 >60  >60 mL/min Final  . Anion gap 10/21/2019 9  5 - 15 Final   Performed at [facility], [address] [place], [place] [address]  . WBC 10/21/2019 11.9* 4.0 - 10.5 K/uL Final  . RBC 10/21/2019 3.02* 4.22 - 5.81 MIL/uL Final  . Hemoglobin 10/21/2019 10.2* 13.0 - 17.0 g/dL Final  . HCT 10/21/2019 28.7* 39 - 52 % Final  . MCV 10/21/2019 95.0  80.0 - 100.0 fL Final  . MCH 10/21/2019 33.8  26.0 - 34.0 pg Final  . MCHC 10/21/2019 35.5  30.0 - 36.0 g/dL Final  . RDW 10/21/2019 15.4  11.5 - 15.5 %  Final  . Platelets 10/21/2019 255  150 - 400 K/uL Final  . nRBC 10/21/2019 0.0  0.0 - 0.2 % Final  . Neutrophils Relative % 10/21/2019 63  % Final  . Neutro Abs 10/21/2019 7.5  1.7 - 7.7  K/uL Final  . Lymphocytes Relative 10/21/2019 12  % Final  . Lymphs Abs 10/21/2019 1.4  0.7 - 4.0 K/uL Final  . Monocytes Relative 10/21/2019 16  % Final  . Monocytes Absolute 10/21/2019 1.9* 0 - 1 K/uL Final  . Eosinophils Relative 10/21/2019 0  % Final  . Eosinophils Absolute 10/21/2019 0.0  0 - 0 K/uL Final  . Basophils Relative 10/21/2019 1  % Final  . Basophils Absolute 10/21/2019 0.1  0 - 0 K/uL Final  . Immature Granulocytes 10/21/2019 8  % Final  . Abs Immature Granulocytes 10/21/2019 0.91* 0.00 - 0.07 K/uL Final   Performed at [facility], [address], [place], [place] [address]    Assessment:  Scott Jordan is a 69 y.o. male with extensive stage small cell lung cancer. He has a >40 pack year smoking. He presented with a recurrent left sided pleural effusion. He has undergone thoracentesis x 2 in the past month. He is s/p pigtail drain. Initial cytology x 2 was negative (lymphocytic exudative effusion).Pleural fluid cytologyon 08/18/2019 confirmed small cell lung cancer.  Chest CT without contraston 08/19/2019 revealed the left pleural pigtail drain. There was a small residual left hydropneumothorax. There was a 6.7 x 4 cm medial basilar left lower lobe lung masssuspicious for primary bronchogenic carcinoma. There was a lingular 1.0 cm solid pulmonary nodulesuspicious for ipsilateral metastasis. There was intralobular septal thickening and patchy groundglass opacity throughout the lingula and the left lower lobe with moderate residual atelectasis of the left lung base. A component of lymphangitic carcinomatosis was suspected. There was ipsilateral infra hilar, subcarinal, AP window, prevascular and upper retroperitoneal adenopathyc/wmetastatic disease.  Head MRI on 08/21/2019 revealed no evidence of metastatic disease.  PET scan on 05/20/20201 revealed markedly hypermetabolic left lower lobe pulmonary lesion (SUV 7.9) c/w the patient's known  malignancy. There was associated hypermetabolic nodal metastases in the mediastinum and left hilum with metastatic involvement of left pleural space. Additional sites included upper abdominal hypermetabolic lymphadenopathy and scattered hypermetabolic bone metastases (left acetabulum, sacrum, and L5). There was interval decrease in left pleural effusion with areas of loculation.  CEA was 3.7 on 09/02/2019.  LDH was 206 on 09/23/2019.  He is day 24 s/p cycle #2 carboplatin, etoposide, and Tecentriq (09/02/2019- 09/23/2019) with Udencya support  Chest, abdomen, and pelvis CT on 10/11/2019 revealed considerable reduction in size of prior hypermetabolic lymph nodes in the chest and abdomen, compatible with response to therapy. There was notably reduced size of the left pleural effusion although some complexity or loculation persists. The previous areas of hypermetabolic nodularity along the pleural surface were not obviously nodular along the pleural surface. There was considerable reduction in size of the previously consolidated/collapsed left lower lobe, although there was still some residual indistinct opacity in this region. The previous hypermetabolic osseous metastatic lesions were relatively occult.  There was mild airway thickening was present, suggesting bronchitis or reactive airways disease. There was trace pelvic ascites, improved from prior. There was chronic deformity of the left medial clavicle likely due to an old fracture with some deformity of the left sternoclavicular joint.   He has hyponatremiasecondary to SIADH.  He began sodium chloride tablets (1 gm TID) on 09/04/2019.  He has a  significant family history of malignancy. A family member has had genetic testing (negative for Lynch syndrome).  Symptomatically, he is doing well.  Tolerating chemo well without significant side effects.  Physical exam is stable.    Plan: 1.   Labs today were reviewed and discussed with patient:  CBC  with diff, CMP, Mg 2.   Metastatic small cell lung cancer - Clinical stage IV - T3N2M1.  Pleural fluid cytology confirmed small cell lung cancer.  Head MRI was negative for CNS metastasis.  -Currently status post cycle 2 of carbo-etoposide-Tecentriq.  Tolerating well without significant side effects  -Has experienced neutropenia despite Udenyca.  No neutropenic fever.  Discussed neutropenic precautions  -Has had mild thrombocytopenia with chemo.  -Last imaging on 10/11/2019 revealed dramatic response to treatment.  - Plan for PET after 4 cycles with likely continuation of maintenance Tecentriq  -Again discussed that chemo is given with palliative intent  -Labs today reviewed and acceptable for treatment  -Again reviewed symptom management 3.  Left pleural effusion  -Status post thoracentesis x 2 followed by pigtail drainage in hospital.  Imaging revealed minimal pleural fluid.  Chronically decreased breath sounds.  No worsening shortness of breath or cough.  Suspect fluid will respond to chemotherapy.  If worsening symptoms consider reimaging and possible thoracentesis with cytology 4.  Hyponatremia  - chronic but worse today. SIADH vs EtOH vs dehydration. In hospital labs not consistent with SIADH though question this in setting of small cell lung cancer. Has not seen nephrology outpatient. Continue salt tablets as prescribed. Encouraged oral intake as sodium and symptoms previously improved with iv fluids. Encouraged discontinuation of EtOH.  5.  Cancer related pain  -Currently well controlled with Lortab 5/325.  No refills requested today. 6.  Bone metastases  -Declines Xgeva due to poor dentition 7. Chemotherapy induced thrombocytopenia  -Previously platelet count 58,000  -Today 255,000  -Continue to monitor 8.  Chemotherapy-induced neutropenia  -WBC 11.9, ANC 7.5  -Afebrile  -Continue Udenyca support with treatment for the prevention of febrile neutropenias 9.   Chemotherapy-induced anemia  -Hemoglobin 10.2, hematocrit 28.7, MCV 95  -Stable  -Ferritin 910, iron saturation 65%, TIBC 280 on 09/16/2019  -B12 was 673 and folate 8.6 on 09/16/2019  -Given palliative intent of myelosuppressive chemotherapy, Retacrit was preauthorized and he will receive first dose today.  Reviewed risks of MI, stroke, VTE vs benefits. Patient agreeable with proceeding.  10.  History of atrial fibrillation  -Followed by Dr. Ubaldo Glassing and on Eliquis  -Goal to maintain platelets > 50,000 on anticoagulation.  11. Hypomagnesemia  -Magnesium 1.5 today. Stable but low despite 2 g 5 days ago.   -Plan for magnesium 2 g IV today  Disposition:  -Counts acceptable to proceed with treatment today.  - RTC as scheduled for continued follow-up with Dr. Mike Gip and consideration of cycle 4 of carboplatin-etoposide-Tecentriq. - Referral to Nephrology for hyponatremia  I discussed the assessment and treatment plan with the patient.  The patient was provided an opportunity to ask questions and all were answered.  The patient agreed with the plan and demonstrated an understanding of the instructions.  The patient was advised to call back if the symptoms worsen or if the condition fails to improve as anticipated.  Beckey Rutter, DNP, AGNP-C [place] at [facility] [phone] (clinic)

## 2019-10-22 ENCOUNTER — Inpatient Hospital Stay: Payer: Medicare Other

## 2019-10-22 ENCOUNTER — Encounter: Payer: Self-pay | Admitting: *Deleted

## 2019-10-22 VITALS — BP 141/86 | HR 85 | Temp 97.6°F | Resp 20

## 2019-10-22 DIAGNOSIS — D6959 Other secondary thrombocytopenia: Secondary | ICD-10-CM | POA: Diagnosis not present

## 2019-10-22 DIAGNOSIS — D701 Agranulocytosis secondary to cancer chemotherapy: Secondary | ICD-10-CM | POA: Diagnosis not present

## 2019-10-22 DIAGNOSIS — C3490 Malignant neoplasm of unspecified part of unspecified bronchus or lung: Secondary | ICD-10-CM

## 2019-10-22 DIAGNOSIS — C7951 Secondary malignant neoplasm of bone: Secondary | ICD-10-CM | POA: Diagnosis not present

## 2019-10-22 DIAGNOSIS — I10 Essential (primary) hypertension: Secondary | ICD-10-CM | POA: Diagnosis not present

## 2019-10-22 DIAGNOSIS — I4891 Unspecified atrial fibrillation: Secondary | ICD-10-CM | POA: Diagnosis not present

## 2019-10-22 DIAGNOSIS — Z5112 Encounter for antineoplastic immunotherapy: Secondary | ICD-10-CM | POA: Diagnosis not present

## 2019-10-22 DIAGNOSIS — Z79899 Other long term (current) drug therapy: Secondary | ICD-10-CM | POA: Diagnosis not present

## 2019-10-22 DIAGNOSIS — C781 Secondary malignant neoplasm of mediastinum: Secondary | ICD-10-CM | POA: Diagnosis not present

## 2019-10-22 DIAGNOSIS — Z5189 Encounter for other specified aftercare: Secondary | ICD-10-CM | POA: Diagnosis not present

## 2019-10-22 DIAGNOSIS — T451X5A Adverse effect of antineoplastic and immunosuppressive drugs, initial encounter: Secondary | ICD-10-CM | POA: Diagnosis not present

## 2019-10-22 DIAGNOSIS — Z7901 Long term (current) use of anticoagulants: Secondary | ICD-10-CM | POA: Diagnosis not present

## 2019-10-22 DIAGNOSIS — C3432 Malignant neoplasm of lower lobe, left bronchus or lung: Secondary | ICD-10-CM | POA: Diagnosis not present

## 2019-10-22 DIAGNOSIS — D6481 Anemia due to antineoplastic chemotherapy: Secondary | ICD-10-CM | POA: Diagnosis not present

## 2019-10-22 DIAGNOSIS — Z5111 Encounter for antineoplastic chemotherapy: Secondary | ICD-10-CM | POA: Diagnosis not present

## 2019-10-22 DIAGNOSIS — G893 Neoplasm related pain (acute) (chronic): Secondary | ICD-10-CM | POA: Diagnosis not present

## 2019-10-22 MED ORDER — SODIUM CHLORIDE 0.9 % IV SOLN
10.0000 mg | Freq: Once | INTRAVENOUS | Status: AC
  Administered 2019-10-22: 10 mg via INTRAVENOUS
  Filled 2019-10-22: qty 1

## 2019-10-22 MED ORDER — SODIUM CHLORIDE 0.9 % IV SOLN
Freq: Once | INTRAVENOUS | Status: AC
  Filled 2019-10-22: qty 250

## 2019-10-22 MED ORDER — HEPARIN SOD (PORK) LOCK FLUSH 100 UNIT/ML IV SOLN
500.0000 [IU] | Freq: Once | INTRAVENOUS | Status: AC
  Administered 2019-10-22: 500 [IU] via INTRAVENOUS
  Filled 2019-10-22: qty 5

## 2019-10-22 MED ORDER — SODIUM CHLORIDE 0.9 % IV SOLN
INTRAVENOUS | Status: DC
  Filled 2019-10-22: qty 250

## 2019-10-22 MED ORDER — SODIUM CHLORIDE 0.9 % IV SOLN
65.0000 mg/m2 | Freq: Once | INTRAVENOUS | Status: AC
  Administered 2019-10-22: 120 mg via INTRAVENOUS
  Filled 2019-10-22: qty 6

## 2019-10-23 ENCOUNTER — Other Ambulatory Visit: Payer: Self-pay

## 2019-10-23 ENCOUNTER — Inpatient Hospital Stay: Payer: Medicare Other

## 2019-10-23 VITALS — BP 152/85 | HR 94 | Temp 98.4°F | Resp 18

## 2019-10-23 DIAGNOSIS — Z79899 Other long term (current) drug therapy: Secondary | ICD-10-CM | POA: Diagnosis not present

## 2019-10-23 DIAGNOSIS — C781 Secondary malignant neoplasm of mediastinum: Secondary | ICD-10-CM | POA: Diagnosis not present

## 2019-10-23 DIAGNOSIS — C3432 Malignant neoplasm of lower lobe, left bronchus or lung: Secondary | ICD-10-CM | POA: Diagnosis not present

## 2019-10-23 DIAGNOSIS — I4891 Unspecified atrial fibrillation: Secondary | ICD-10-CM | POA: Diagnosis not present

## 2019-10-23 DIAGNOSIS — C3490 Malignant neoplasm of unspecified part of unspecified bronchus or lung: Secondary | ICD-10-CM

## 2019-10-23 DIAGNOSIS — D6481 Anemia due to antineoplastic chemotherapy: Secondary | ICD-10-CM | POA: Diagnosis not present

## 2019-10-23 DIAGNOSIS — Z5112 Encounter for antineoplastic immunotherapy: Secondary | ICD-10-CM | POA: Diagnosis not present

## 2019-10-23 DIAGNOSIS — Z7901 Long term (current) use of anticoagulants: Secondary | ICD-10-CM | POA: Diagnosis not present

## 2019-10-23 DIAGNOSIS — Z5111 Encounter for antineoplastic chemotherapy: Secondary | ICD-10-CM | POA: Diagnosis not present

## 2019-10-23 DIAGNOSIS — T451X5A Adverse effect of antineoplastic and immunosuppressive drugs, initial encounter: Secondary | ICD-10-CM | POA: Diagnosis not present

## 2019-10-23 DIAGNOSIS — G893 Neoplasm related pain (acute) (chronic): Secondary | ICD-10-CM | POA: Diagnosis not present

## 2019-10-23 DIAGNOSIS — I10 Essential (primary) hypertension: Secondary | ICD-10-CM | POA: Diagnosis not present

## 2019-10-23 DIAGNOSIS — D701 Agranulocytosis secondary to cancer chemotherapy: Secondary | ICD-10-CM | POA: Diagnosis not present

## 2019-10-23 DIAGNOSIS — C7951 Secondary malignant neoplasm of bone: Secondary | ICD-10-CM | POA: Diagnosis not present

## 2019-10-23 DIAGNOSIS — Z5189 Encounter for other specified aftercare: Secondary | ICD-10-CM | POA: Diagnosis not present

## 2019-10-23 DIAGNOSIS — D6959 Other secondary thrombocytopenia: Secondary | ICD-10-CM | POA: Diagnosis not present

## 2019-10-23 MED ORDER — SODIUM CHLORIDE 0.9 % IV SOLN
65.0000 mg/m2 | Freq: Once | INTRAVENOUS | Status: AC
  Administered 2019-10-23: 120 mg via INTRAVENOUS
  Filled 2019-10-23: qty 6

## 2019-10-23 MED ORDER — HEPARIN SOD (PORK) LOCK FLUSH 100 UNIT/ML IV SOLN
500.0000 [IU] | Freq: Once | INTRAVENOUS | Status: AC | PRN
  Administered 2019-10-23: 500 [IU]
  Filled 2019-10-23: qty 5

## 2019-10-23 MED ORDER — SODIUM CHLORIDE 0.9 % IV SOLN
10.0000 mg | Freq: Once | INTRAVENOUS | Status: AC
  Administered 2019-10-23: 10 mg via INTRAVENOUS
  Filled 2019-10-23: qty 1

## 2019-10-23 MED ORDER — SODIUM CHLORIDE 0.9 % IV SOLN
Freq: Once | INTRAVENOUS | Status: AC
  Filled 2019-10-23: qty 250

## 2019-10-23 MED ORDER — SODIUM CHLORIDE 0.9% FLUSH
10.0000 mL | INTRAVENOUS | Status: DC | PRN
  Administered 2019-10-23: 10 mL
  Filled 2019-10-23: qty 10

## 2019-10-24 ENCOUNTER — Inpatient Hospital Stay: Payer: Medicare Other

## 2019-10-24 VITALS — BP 127/73 | HR 92 | Temp 97.1°F | Resp 18

## 2019-10-24 DIAGNOSIS — C3490 Malignant neoplasm of unspecified part of unspecified bronchus or lung: Secondary | ICD-10-CM

## 2019-10-24 DIAGNOSIS — Z7901 Long term (current) use of anticoagulants: Secondary | ICD-10-CM | POA: Diagnosis not present

## 2019-10-24 DIAGNOSIS — Z5111 Encounter for antineoplastic chemotherapy: Secondary | ICD-10-CM | POA: Diagnosis not present

## 2019-10-24 DIAGNOSIS — Z79899 Other long term (current) drug therapy: Secondary | ICD-10-CM | POA: Diagnosis not present

## 2019-10-24 DIAGNOSIS — D6481 Anemia due to antineoplastic chemotherapy: Secondary | ICD-10-CM | POA: Diagnosis not present

## 2019-10-24 DIAGNOSIS — T451X5A Adverse effect of antineoplastic and immunosuppressive drugs, initial encounter: Secondary | ICD-10-CM | POA: Diagnosis not present

## 2019-10-24 DIAGNOSIS — C3432 Malignant neoplasm of lower lobe, left bronchus or lung: Secondary | ICD-10-CM | POA: Diagnosis not present

## 2019-10-24 DIAGNOSIS — D6959 Other secondary thrombocytopenia: Secondary | ICD-10-CM | POA: Diagnosis not present

## 2019-10-24 DIAGNOSIS — G893 Neoplasm related pain (acute) (chronic): Secondary | ICD-10-CM | POA: Diagnosis not present

## 2019-10-24 DIAGNOSIS — D701 Agranulocytosis secondary to cancer chemotherapy: Secondary | ICD-10-CM | POA: Diagnosis not present

## 2019-10-24 DIAGNOSIS — I10 Essential (primary) hypertension: Secondary | ICD-10-CM | POA: Diagnosis not present

## 2019-10-24 DIAGNOSIS — I4891 Unspecified atrial fibrillation: Secondary | ICD-10-CM | POA: Diagnosis not present

## 2019-10-24 DIAGNOSIS — Z5112 Encounter for antineoplastic immunotherapy: Secondary | ICD-10-CM | POA: Diagnosis not present

## 2019-10-24 DIAGNOSIS — C781 Secondary malignant neoplasm of mediastinum: Secondary | ICD-10-CM | POA: Diagnosis not present

## 2019-10-24 DIAGNOSIS — C7951 Secondary malignant neoplasm of bone: Secondary | ICD-10-CM | POA: Diagnosis not present

## 2019-10-24 DIAGNOSIS — Z5189 Encounter for other specified aftercare: Secondary | ICD-10-CM | POA: Diagnosis not present

## 2019-10-24 MED ORDER — PEGFILGRASTIM-CBQV 6 MG/0.6ML ~~LOC~~ SOSY
6.0000 mg | PREFILLED_SYRINGE | Freq: Once | SUBCUTANEOUS | Status: AC
  Administered 2019-10-24: 6 mg via SUBCUTANEOUS

## 2019-11-07 NOTE — Progress Notes (Signed)
[facility]  [address], [address] [place], [place] [address] Phone: [phone]  Fax: [phone]   Clinic Day:  11/11/2019  Referring physician: No ref. provider found  Chief Complaint: Scott Jordan is a 69 y.o. male with extensive stage small cell lung cancer who is seen for assessment prior to cycle #4 carboplatin, etoposide and Tecentriq.  HPI: The patient was last seen in the medical oncology clinic on 10/21/2019 by Beckey Rutter, NP. At that time, he was doing well. He was tolerating chemotherapy well without significant side effects. Exam was stable. Hematocrit was 28.7, hemoglobin 10.2, MCV 95.0, platelets 255,000, WBC 11,900 (ANC 7,500). Sodium was 127.  Creatinine was 0.55. Calcium was 8.7. Albumin was 3.0. Magnesium was 1.5. He received 2 g IV Magnesium and cycle #3 carboplatin, etoposide and Tecentriq with Udencya support.  He was referred to nephrology for hyponatremia.  During the interim, he has been okay. A couple of days ago, he developed some right axilla pain. His energy level has been good. He has been eating a couple of times daily and drinks 2 Boosts per day. His productive cough has improved. He denies hemoptysis. He gets short of breath on exertion. He no longer gets dizzy when he walks. He takes 3 salt tablets daily and has been eating potato chips.  The patient agrees to Retacrit today.   Past Medical History:  Diagnosis Date  . A-fib ([place]) 01/10/2019   pt st this was dx by Dr. Ubaldo Glassing  . Cancer ([place]) 2021   LUNG  . Complication of anesthesia    DIFFICULTY WAKING UP AFTER SURGERY- 20 YRS AGO  . Hypertension   . Lung cancer ([place])   . Substance abuse [facility])     Past Surgical History:  Procedure Laterality Date  . ARM DEBRIDEMENT Left    INCISION AND DEBRIDEMENT LOWER ARM -20 YRS AGO  . BACK SURGERY    . PORTACATH PLACEMENT Left 08/29/2019   Procedure: INSERTION PORT-A-CATH;  Surgeon: Nestor Lewandowsky, MD;  Location: [facility] ORS;  Service:  General;  Laterality: Left;    History reviewed. No pertinent family history.  Social History:  reports that he has quit smoking. His smoking use included cigarettes. He smoked 0.50 packs per day. He has never used smokeless tobacco. He reports current alcohol use. He reports previous drug use. Drug: Heroin. The patient denies any exposure to radiation or toxins. The patient lives in [place]. He has family friends staying with him to help care for his needs.His niece, [name] [phone]), is his medical power of attorney. The patient is alone today.  Allergies: No Known Allergies  Current Medications: Current Outpatient Medications  Medication Sig Dispense Refill  . diltiazem (CARDIZEM CD) 240 MG 24 hr capsule Take 1 capsule (240 mg total) by mouth daily. 30 capsule 2  . ELIQUIS 5 MG TABS tablet Take 5 mg by mouth 2 (two) times daily.    Marland Kitchen escitalopram (LEXAPRO) 10 MG tablet Take 1 tablet (10 mg total) by mouth daily. 90 tablet 0  . feeding supplement, ENSURE ENLIVE, (ENSURE ENLIVE) LIQD Take 237 mLs by mouth 3 (three) times daily between meals. 237 mL 12  . furosemide (LASIX) 20 MG tablet Take 1 tablet (20 mg total) by mouth daily. 20 tablet 1  . HYDROcodone-acetaminophen (NORCO/VICODIN) 5-325 MG tablet Take 1 tablet by mouth every 6 (six) hours as needed for moderate pain. 30 tablet 0  . lidocaine-prilocaine (EMLA) cream Apply to affected area once 30 g  3  . losartan (COZAAR) 25 MG tablet Take 1 tablet (25 mg total) by mouth daily. 30 tablet 2  . sodium chloride 1 g tablet Take 1 tablet (1 g total) by mouth 3 (three) times daily with meals. 90 tablet 1  . nystatin (MYCOSTATIN) 100000 UNIT/ML suspension Use as directed 5 mLs (500,000 Units total) in the mouth or throat 4 (four) times daily. Swish and spit (Patient not taking: Reported on 11/11/2019) 60 mL 0  . ondansetron (ZOFRAN) 8 MG tablet Take 1 tablet (8 mg total) by mouth every 8 (eight) hours as needed for refractory nausea /  vomiting. Start on day 3 after carboplatin chemo. (Patient not taking: Reported on 10/03/2019) 30 tablet 1   No current facility-administered medications for this visit.   Facility-Administered Medications Ordered in Other Visits  Medication Dose Route Frequency Provider Last Rate Last Admin  . heparin lock flush 100 unit/mL  500 Units Intravenous Once Laraina Sulton C, MD      . heparin lock flush 100 unit/mL  500 Units Intravenous Once Loran Fleet C, MD      . sodium chloride flush (NS) 0.9 % injection 10 mL  10 mL Intravenous PRN Lequita Asal, MD   10 mL at 11/11/19 7915    Review of Systems  Constitutional: Positive for weight loss (1 lb). Negative for chills, diaphoresis, fever and malaise/fatigue.       Feels good.  HENT: Negative for congestion, ear discharge, ear pain, hearing loss, nosebleeds, sinus pain, sore throat and tinnitus.   Eyes: Negative for blurred vision and double vision.  Respiratory: Positive for sputum production (thick white sputum, improved) and shortness of breath (on exertion). Negative for cough and hemoptysis.   Cardiovascular: Negative.  Negative for chest pain, palpitations and leg swelling.  Gastrointestinal: Negative for abdominal pain, blood in stool, constipation, diarrhea, heartburn, melena, nausea and vomiting.       Eating 2 meals daily. Drinks 2 Boosts per day.  Genitourinary: Negative for dysuria, frequency, hematuria and urgency.  Musculoskeletal: Negative.  Negative for back pain, joint pain, myalgias and neck pain.       Right axilla soreness.  Skin: Negative.  Negative for itching and rash.  Neurological: Negative for dizziness, tingling, sensory change, focal weakness, weakness and headaches.  Endo/Heme/Allergies: Negative.  Does not bruise/bleed easily.  Psychiatric/Behavioral: Negative.  Negative for depression and memory loss. The patient is not nervous/anxious and does not have insomnia.   All other systems reviewed and are  negative.  Performance status (ECOG): 1  Vitals Blood pressure 126/85, pulse 93, temperature (!) 97.2 F (36.2 C), temperature source Tympanic, height 6\' 2"  (1.88 m), weight 131 lb 15.1 oz (59.8 kg), SpO2 100 %.   Physical Exam Vitals and nursing note reviewed.  Constitutional:      General: He is not in acute distress.    Appearance: Normal appearance. He is well-developed. He is not diaphoretic.     Interventions: Face mask in place.     Comments: He is no longer in a wheelchair.  HENT:     Head: Normocephalic and atraumatic.     Comments: Shaved head. Mask.    Left Ear: Hearing normal.     Mouth/Throat:     Mouth: No oral lesions.  Eyes:     General: No scleral icterus.    Extraocular Movements: Extraocular movements intact.     Conjunctiva/sclera: Conjunctivae normal.     Pupils: Pupils are equal, round, and reactive to light.  Comments: Blue eyes.   Neck:     Vascular: No JVD.  Cardiovascular:     Rate and Rhythm: Normal rate and regular rhythm.     Heart sounds: Normal heart sounds. No murmur heard.  No friction rub. No gallop.   Pulmonary:     Effort: Pulmonary effort is normal. No respiratory distress.     Breath sounds: Normal breath sounds. No wheezing or rales.  Chest:     Chest wall: No tenderness.  Abdominal:     General: Bowel sounds are normal. There is no distension.     Palpations: Abdomen is soft. There is no mass.     Tenderness: There is no abdominal tenderness. There is no guarding or rebound.  Musculoskeletal:        General: No swelling or tenderness. Normal range of motion.     Cervical back: Normal range of motion and neck supple.  Lymphadenopathy:     Head:     Right side of head: No preauricular, posterior auricular or occipital adenopathy.     Left side of head: No preauricular, posterior auricular or occipital adenopathy.     Cervical: No cervical adenopathy.     Upper Body:     Right upper body: No supraclavicular or axillary  adenopathy.     Left upper body: No supraclavicular or axillary adenopathy.     Lower Body: No right inguinal adenopathy. No left inguinal adenopathy.  Skin:    General: Skin is warm and dry.     Coloration: Skin is not pale.     Findings: No bruising, erythema, lesion or rash.  Neurological:     Mental Status: He is alert and oriented to person, place, and time. Mental status is at baseline.  Psychiatric:        Mood and Affect: Mood normal.        Behavior: Behavior normal.        Thought Content: Thought content normal.        Judgment: Judgment normal.    Infusion on 11/11/2019  Component Date Value Ref Range Status  . WBC 11/11/2019 10.8* 4.0 - 10.5 K/uL Final  . RBC 11/11/2019 2.58* 4.22 - 5.81 MIL/uL Final  . Hemoglobin 11/11/2019 8.5* 13.0 - 17.0 g/dL Final  . HCT 11/11/2019 25.9* 39 - 52 % Final  . MCV 11/11/2019 100.4* 80.0 - 100.0 fL Final  . MCH 11/11/2019 32.9  26.0 - 34.0 pg Final  . MCHC 11/11/2019 32.8  30.0 - 36.0 g/dL Final  . RDW 11/11/2019 16.9* 11.5 - 15.5 % Final  . Platelets 11/11/2019 462* 150 - 400 K/uL Final  . nRBC 11/11/2019 0.0  0.0 - 0.2 % Final  . Neutrophils Relative % 11/11/2019 68  % Final  . Neutro Abs 11/11/2019 7.5  1.7 - 7.7 K/uL Final  . Lymphocytes Relative 11/11/2019 12  % Final  . Lymphs Abs 11/11/2019 1.3  0.7 - 4.0 K/uL Final  . Monocytes Relative 11/11/2019 10  % Final  . Monocytes Absolute 11/11/2019 1.1* 0 - 1 K/uL Final  . Eosinophils Relative 11/11/2019 0  % Final  . Eosinophils Absolute 11/11/2019 0.0  0 - 0 K/uL Final  . Basophils Relative 11/11/2019 1  % Final  . Basophils Absolute 11/11/2019 0.1  0 - 0 K/uL Final  . Immature Granulocytes 11/11/2019 9  % Final  . Abs Immature Granulocytes 11/11/2019 0.96* 0.00 - 0.07 K/uL Final   Performed at [facility], [place], [place]  [address] [place], [place] [address]  . Sodium 11/11/2019 136  135 - 145 mmol/L Final  . Potassium 11/11/2019 3.8  3.5 - 5.1 mmol/L Final  . Chloride  11/11/2019 102  98 - 111 mmol/L Final  . CO2 11/11/2019 27  22 - 32 mmol/L Final  . Glucose, Bld 11/11/2019 111* 70 - 99 mg/dL Final   Glucose reference range applies only to samples taken after fasting for at least 8 hours.  . BUN 11/11/2019 9  8 - 23 mg/dL Final  . Creatinine, Ser 11/11/2019 0.74  0.61 - 1.24 mg/dL Final  . Calcium 11/11/2019 8.7* 8.9 - 10.3 mg/dL Final  . Total Protein 11/11/2019 6.6  6.5 - 8.1 g/dL Final  . Albumin 11/11/2019 2.8* 3.5 - 5.0 g/dL Final  . AST 11/11/2019 17  15 - 41 U/L Final  . ALT 11/11/2019 12  0 - 44 U/L Final  . Alkaline Phosphatase 11/11/2019 90  38 - 126 U/L Final  . Total Bilirubin 11/11/2019 0.5  0.3 - 1.2 mg/dL Final  . GFR calc non Af Amer 11/11/2019 >60  >60 mL/min Final  . GFR calc Af Amer 11/11/2019 >60  >60 mL/min Final  . Anion gap 11/11/2019 7  5 - 15 Final   Performed at [facility], [address] [place] [place] [employer], [place] [address]    Assessment:  JENNIE BOLAR is a 69 y.o. male with extensive stage small cell lung cancer. He has a >40 pack year smoking. He presented with a recurrent left sided pleural effusion. He has undergone thoracentesis x 2 in the past month. He is s/p pigtail drain. Initial cytology x 2 was negative (lymphocytic exudative effusion).Pleural fluid cytologyon 08/18/2019 confirmed small cell lung cancer.  Chest CT without contraston 08/19/2019 revealed the left pleural pigtail drain. There was a small residual left hydropneumothorax. There was a 6.7 x 4 cm medial basilar left lower lobe lung masssuspicious for primary bronchogenic carcinoma. There was a lingular 1.0 cm solid pulmonary nodulesuspicious for ipsilateral metastasis. There was intralobular septal thickening and patchy groundglass opacity throughout the lingula and the left lower lobe with moderate residual atelectasis of the left lung base. A component of lymphangitic carcinomatosis was suspected. There was ipsilateral infra hilar,  subcarinal, AP window, prevascular and upper retroperitoneal adenopathyc/wmetastatic disease.  Head MRI on 08/21/2019 revealed no evidence of metastatic disease.  PET scan on 05/20/20201 revealed markedly hypermetabolic left lower lobe pulmonary lesion (SUV 7.9) c/w the patient's known malignancy. There was associated hypermetabolic nodal metastases in the mediastinum and left hilum with metastatic involvement of left pleural space. Additional sites included upper abdominal hypermetabolic lymphadenopathy and scattered hypermetabolic bone metastases (left acetabulum, sacrum, and L5). There was interval decrease in left pleural effusion with areas of loculation.  CEA was 3.7 on 09/02/2019.  LDH was 206 on 09/23/2019.  He is s/p 3 cycles of carboplatin, etoposide, and Tecentriq (09/02/2019- 10/21/2019) with Udencya support  Chest, abdomen, and pelvis CT on 10/11/2019 revealed considerable reduction in size of prior hypermetabolic lymph nodes in the chest and abdomen, compatible with response to therapy. There was notably reduced size of the left pleural effusion although some complexity or loculation persists. The previous areas of hypermetabolic nodularity along the pleural surface were not obviously nodular along the pleural surface. There was considerable reduction in size of the previously consolidated/collapsed left lower lobe, although there was still some residual indistinct opacity in this region. The previous hypermetabolic osseous metastatic lesions were relatively occult.  There was mild  airway thickening was present, suggesting bronchitis or reactive airways disease. There was trace pelvic ascites, improved from prior. There was chronic deformity of the left medial clavicle likely due to an old fracture with some deformity of the left sternoclavicular joint.   He has hyponatremiasecondary to SIADH.  He began sodium chloride tablets (1 gm TID) on 09/04/2019.  He has a significant family  history of malignancy. A family member has had genetic testing (negative for Lynch syndrome).  Symptomatically, he is doing well.  Exam is stable.  Hemoglobin is 8.5.  Plan: 1.   Labs today:  CBC with diff, CMP, Mg, TSH. 2.   Metastatic small cell lung cancer Clinical stage T3N2M1 (stage IV). Pleural fluid cytology confirmed small cell lung cancer. Head MRIwas negative forCNS metastasis. He is currently s/p 3 cycles of carboplatin, etoposide and Tecentriq.   He continues to tolerate chemotherapy well.   H develops transient neutropenia despite Udencia support.   He develops thrombocytopenia with each cycle.   Chest, abdomen, and pelvis CT on 10/11/2019 revealed a dramatic response to treatment.   Review plan for restaging studies after 4 cycles and likely continuation of maintenance Tecentriq.  Labs reviewed.  Begin cycle #4 carboplatin,etoposide, and Tecentriq.  Discuss symptom management.  He has antiemetics at home to use on a prn bases.  Interventions are adequate.    3.Left pleural effusion             Patient is s/p thoracentesis x 2 followed by pig tail drainage in hospital.             Fluid has not returned since initiation of therapy. 4.   Hyponatremia Sodium 137 on salt tablets 1 gm  3 x/day.   Discuss plan to taper to off.     Decrease salt tablets to 1 tablet BID.  Etiology secondary to SIADH from small cell lung cancer.  Continue free water restriction and fluids with electrolytes.  5. Cancer-related pain             Pain remains well controlled with Lortab 5/325 prn  Patient declined Xgeva for bone metastasisplat. 6.   History of atrial fibrillation             Patient followed by Dr Ubaldo Glassing and is on Eliquis.             Platelet count decreased from 310,000 to 77,000 with cycle #1.  Platelet count decreased from 316,000 to 58,000 with cycle #2.  Maintain platelets > 50,000 secondary to chronic  anticoagulation. 7.   Chemotherapy induced anemia  Hematocrit 25.9.  Hemoglobin 8.5.  MCV 100.4.   Ferritin 910 with an iron saturation 65% and a TIBC 280 on 09/16/2019.   B12 was 673 and folate 8.6 on 09/16/2019.   Retacrit today. 8.   Hypomagnesemia  Magnesium 1.5.  Magnesium 2 gm IV today. 9.   Retacrit today. 10.   Cycle #3 carboplatin, etoposide and Tecentriq today. 11.   RTC x 2 days for etoposide. 12.   RTC on 11/14/2019 for Udencya. 13.   RTC in 10 days for labs (CBC with diff, BMP, hold tube). 14.   RTC in 2 weeks for labs (CBC with diff) and +/- Retacrit. 15.   Chest, abdomen, and pelvis CT on 11/29/2019. 16.   RTC in 3 weeks for MD assessment, labs (CBC with diff, CMP, Mg, TSH), review of scans, and +/- cycle #5 carboplatin, etoposide and Tecentriq.  I discussed the assessment and treatment  plan with the patient.  The patient was provided an opportunity to ask questions and all were answered.  The patient agreed with the plan and demonstrated an understanding of the instructions.  The patient was advised to call back if the symptoms worsen or if the condition fails to improve as anticipated.   Lequita Asal, MD, PhD    11/11/2019, 9:08 AM  I, Mirian Mo Tufford, am acting as Education administrator for [employer]. Mike Gip, MD, PhD.  I, Artyom Stencel C. Mike Gip, MD, have reviewed the above documentation for accuracy and completeness, and I agree with the above.

## 2019-11-11 ENCOUNTER — Inpatient Hospital Stay: Payer: Medicare Other

## 2019-11-11 ENCOUNTER — Other Ambulatory Visit: Payer: Self-pay

## 2019-11-11 ENCOUNTER — Inpatient Hospital Stay: Payer: Medicare Other | Attending: Hematology and Oncology | Admitting: Hematology and Oncology

## 2019-11-11 ENCOUNTER — Encounter: Payer: Self-pay | Admitting: Hematology and Oncology

## 2019-11-11 VITALS — BP 126/85 | HR 93 | Temp 97.2°F | Ht 74.0 in | Wt 131.9 lb

## 2019-11-11 DIAGNOSIS — Z95828 Presence of other vascular implants and grafts: Secondary | ICD-10-CM

## 2019-11-11 DIAGNOSIS — Z5111 Encounter for antineoplastic chemotherapy: Secondary | ICD-10-CM | POA: Insufficient documentation

## 2019-11-11 DIAGNOSIS — D696 Thrombocytopenia, unspecified: Secondary | ICD-10-CM | POA: Insufficient documentation

## 2019-11-11 DIAGNOSIS — C7951 Secondary malignant neoplasm of bone: Secondary | ICD-10-CM

## 2019-11-11 DIAGNOSIS — J90 Pleural effusion, not elsewhere classified: Secondary | ICD-10-CM | POA: Diagnosis not present

## 2019-11-11 DIAGNOSIS — E871 Hypo-osmolality and hyponatremia: Secondary | ICD-10-CM | POA: Insufficient documentation

## 2019-11-11 DIAGNOSIS — D6481 Anemia due to antineoplastic chemotherapy: Secondary | ICD-10-CM | POA: Diagnosis not present

## 2019-11-11 DIAGNOSIS — I4891 Unspecified atrial fibrillation: Secondary | ICD-10-CM | POA: Insufficient documentation

## 2019-11-11 DIAGNOSIS — C781 Secondary malignant neoplasm of mediastinum: Secondary | ICD-10-CM | POA: Insufficient documentation

## 2019-11-11 DIAGNOSIS — Z5112 Encounter for antineoplastic immunotherapy: Secondary | ICD-10-CM | POA: Insufficient documentation

## 2019-11-11 DIAGNOSIS — C3490 Malignant neoplasm of unspecified part of unspecified bronchus or lung: Secondary | ICD-10-CM | POA: Insufficient documentation

## 2019-11-11 DIAGNOSIS — Z87891 Personal history of nicotine dependence: Secondary | ICD-10-CM | POA: Insufficient documentation

## 2019-11-11 DIAGNOSIS — Z7901 Long term (current) use of anticoagulants: Secondary | ICD-10-CM | POA: Insufficient documentation

## 2019-11-11 DIAGNOSIS — I10 Essential (primary) hypertension: Secondary | ICD-10-CM | POA: Diagnosis not present

## 2019-11-11 DIAGNOSIS — G893 Neoplasm related pain (acute) (chronic): Secondary | ICD-10-CM | POA: Insufficient documentation

## 2019-11-11 DIAGNOSIS — T451X5A Adverse effect of antineoplastic and immunosuppressive drugs, initial encounter: Secondary | ICD-10-CM | POA: Diagnosis not present

## 2019-11-11 DIAGNOSIS — Z7189 Other specified counseling: Secondary | ICD-10-CM

## 2019-11-11 DIAGNOSIS — Z79899 Other long term (current) drug therapy: Secondary | ICD-10-CM | POA: Diagnosis not present

## 2019-11-11 LAB — COMPREHENSIVE METABOLIC PANEL
ALT: 12 U/L (ref 0–44)
AST: 17 U/L (ref 15–41)
Albumin: 2.8 g/dL — ABNORMAL LOW (ref 3.5–5.0)
Alkaline Phosphatase: 90 U/L (ref 38–126)
Anion gap: 7 (ref 5–15)
BUN: 9 mg/dL (ref 8–23)
CO2: 27 mmol/L (ref 22–32)
Calcium: 8.7 mg/dL — ABNORMAL LOW (ref 8.9–10.3)
Chloride: 102 mmol/L (ref 98–111)
Creatinine, Ser: 0.74 mg/dL (ref 0.61–1.24)
GFR calc Af Amer: >60 mL/min (ref >60)
GFR calc non Af Amer: >60 mL/min (ref >60)
Glucose, Bld: 111 mg/dL — ABNORMAL HIGH (ref 70–99)
Potassium: 3.8 mmol/L (ref 3.5–5.1)
Sodium: 136 mmol/L (ref 135–145)
Total Bilirubin: 0.5 mg/dL (ref 0.3–1.2)
Total Protein: 6.6 g/dL (ref 6.5–8.1)

## 2019-11-11 LAB — CBC WITH DIFFERENTIAL/PLATELET
Abs Immature Granulocytes: 0.96 10*3/uL — ABNORMAL HIGH (ref 0.00–0.07)
Basophils Absolute: 0.1 10*3/uL (ref 0.0–0.1)
Basophils Relative: 1 %
Eosinophils Absolute: 0 10*3/uL (ref 0.0–0.5)
Eosinophils Relative: 0 %
HCT: 25.9 % — ABNORMAL LOW (ref 39.0–52.0)
Hemoglobin: 8.5 g/dL — ABNORMAL LOW (ref 13.0–17.0)
Immature Granulocytes: 9 %
Lymphocytes Relative: 12 %
Lymphs Abs: 1.3 10*3/uL (ref 0.7–4.0)
MCH: 32.9 pg (ref 26.0–34.0)
MCHC: 32.8 g/dL (ref 30.0–36.0)
MCV: 100.4 fL — ABNORMAL HIGH (ref 80.0–100.0)
Monocytes Absolute: 1.1 10*3/uL — ABNORMAL HIGH (ref 0.1–1.0)
Monocytes Relative: 10 %
Neutro Abs: 7.5 10*3/uL (ref 1.7–7.7)
Neutrophils Relative %: 68 %
Platelets: 462 10*3/uL — ABNORMAL HIGH (ref 150–400)
RBC: 2.58 MIL/uL — ABNORMAL LOW (ref 4.22–5.81)
RDW: 16.9 % — ABNORMAL HIGH (ref 11.5–15.5)
WBC: 10.8 10*3/uL — ABNORMAL HIGH (ref 4.0–10.5)
nRBC: 0 % (ref 0.0–0.2)

## 2019-11-11 LAB — MAGNESIUM: Magnesium: 1.5 mg/dL — ABNORMAL LOW (ref 1.7–2.4)

## 2019-11-11 MED ORDER — MAGNESIUM SULFATE 2 GM/50ML IV SOLN
2.0000 g | Freq: Once | INTRAVENOUS | Status: AC
  Administered 2019-11-11: 2 g via INTRAVENOUS
  Filled 2019-11-11: qty 50

## 2019-11-11 MED ORDER — SODIUM CHLORIDE 0.9 % IV SOLN
65.0000 mg/m2 | Freq: Once | INTRAVENOUS | Status: AC
  Administered 2019-11-11: 120 mg via INTRAVENOUS
  Filled 2019-11-11: qty 6

## 2019-11-11 MED ORDER — SODIUM CHLORIDE 0.9 % IV SOLN
150.0000 mg | Freq: Once | INTRAVENOUS | Status: AC
  Administered 2019-11-11: 150 mg via INTRAVENOUS
  Filled 2019-11-11: qty 5

## 2019-11-11 MED ORDER — EPOETIN ALFA-EPBX 10000 UNIT/ML IJ SOLN
10000.0000 [IU] | Freq: Once | INTRAMUSCULAR | Status: AC
  Administered 2019-11-11: 10000 [IU] via SUBCUTANEOUS
  Filled 2019-11-11: qty 1

## 2019-11-11 MED ORDER — PALONOSETRON HCL INJECTION 0.25 MG/5ML
0.2500 mg | Freq: Once | INTRAVENOUS | Status: AC
  Administered 2019-11-11: 0.25 mg via INTRAVENOUS
  Filled 2019-11-11: qty 5

## 2019-11-11 MED ORDER — SODIUM CHLORIDE 0.9 % IV SOLN
10.0000 mg | Freq: Once | INTRAVENOUS | Status: AC
  Administered 2019-11-11: 10 mg via INTRAVENOUS
  Filled 2019-11-11: qty 10

## 2019-11-11 MED ORDER — SODIUM CHLORIDE 0.9% FLUSH
10.0000 mL | INTRAVENOUS | Status: AC | PRN
  Administered 2019-11-11: 10 mL via INTRAVENOUS
  Filled 2019-11-11: qty 10

## 2019-11-11 MED ORDER — SODIUM CHLORIDE 0.9 % IV SOLN
Freq: Once | INTRAVENOUS | Status: AC
  Filled 2019-11-11: qty 250

## 2019-11-11 MED ORDER — SODIUM CHLORIDE 0.9 % IV SOLN
439.5000 mg | Freq: Once | INTRAVENOUS | Status: AC
  Administered 2019-11-11: 440 mg via INTRAVENOUS
  Filled 2019-11-11: qty 44

## 2019-11-11 MED ORDER — HEPARIN SOD (PORK) LOCK FLUSH 100 UNIT/ML IV SOLN
500.0000 [IU] | Freq: Once | INTRAVENOUS | Status: AC
  Administered 2019-11-11: 500 [IU] via INTRAVENOUS
  Filled 2019-11-11: qty 5

## 2019-11-11 MED ORDER — SODIUM CHLORIDE 0.9 % IV SOLN
1200.0000 mg | Freq: Once | INTRAVENOUS | Status: AC
  Administered 2019-11-11: 1200 mg via INTRAVENOUS
  Filled 2019-11-11: qty 20

## 2019-11-11 NOTE — Progress Notes (Signed)
The patient c/o right upper inner arm pain  / with new nodule.

## 2019-11-12 ENCOUNTER — Inpatient Hospital Stay: Payer: Medicare Other

## 2019-11-12 VITALS — BP 131/91 | HR 103 | Temp 96.2°F | Resp 18

## 2019-11-12 DIAGNOSIS — Z5112 Encounter for antineoplastic immunotherapy: Secondary | ICD-10-CM | POA: Diagnosis not present

## 2019-11-12 DIAGNOSIS — Z5111 Encounter for antineoplastic chemotherapy: Secondary | ICD-10-CM | POA: Diagnosis not present

## 2019-11-12 DIAGNOSIS — J90 Pleural effusion, not elsewhere classified: Secondary | ICD-10-CM | POA: Diagnosis not present

## 2019-11-12 DIAGNOSIS — C7951 Secondary malignant neoplasm of bone: Secondary | ICD-10-CM | POA: Diagnosis not present

## 2019-11-12 DIAGNOSIS — Z87891 Personal history of nicotine dependence: Secondary | ICD-10-CM | POA: Diagnosis not present

## 2019-11-12 DIAGNOSIS — Z79899 Other long term (current) drug therapy: Secondary | ICD-10-CM | POA: Diagnosis not present

## 2019-11-12 DIAGNOSIS — E871 Hypo-osmolality and hyponatremia: Secondary | ICD-10-CM | POA: Diagnosis not present

## 2019-11-12 DIAGNOSIS — C3490 Malignant neoplasm of unspecified part of unspecified bronchus or lung: Secondary | ICD-10-CM

## 2019-11-12 DIAGNOSIS — T451X5A Adverse effect of antineoplastic and immunosuppressive drugs, initial encounter: Secondary | ICD-10-CM | POA: Diagnosis not present

## 2019-11-12 DIAGNOSIS — C781 Secondary malignant neoplasm of mediastinum: Secondary | ICD-10-CM | POA: Diagnosis not present

## 2019-11-12 DIAGNOSIS — D6481 Anemia due to antineoplastic chemotherapy: Secondary | ICD-10-CM | POA: Diagnosis not present

## 2019-11-12 DIAGNOSIS — D696 Thrombocytopenia, unspecified: Secondary | ICD-10-CM | POA: Diagnosis not present

## 2019-11-12 DIAGNOSIS — I4891 Unspecified atrial fibrillation: Secondary | ICD-10-CM | POA: Diagnosis not present

## 2019-11-12 DIAGNOSIS — I10 Essential (primary) hypertension: Secondary | ICD-10-CM | POA: Diagnosis not present

## 2019-11-12 DIAGNOSIS — G893 Neoplasm related pain (acute) (chronic): Secondary | ICD-10-CM | POA: Diagnosis not present

## 2019-11-12 DIAGNOSIS — Z7901 Long term (current) use of anticoagulants: Secondary | ICD-10-CM | POA: Diagnosis not present

## 2019-11-12 LAB — T4: T4, Total: 6.6 ug/dL (ref 4.5–12.0)

## 2019-11-12 MED ORDER — HEPARIN SOD (PORK) LOCK FLUSH 100 UNIT/ML IV SOLN
500.0000 [IU] | Freq: Once | INTRAVENOUS | Status: AC | PRN
  Administered 2019-11-12: 500 [IU]
  Filled 2019-11-12: qty 5

## 2019-11-12 MED ORDER — SODIUM CHLORIDE 0.9 % IV SOLN
65.0000 mg/m2 | Freq: Once | INTRAVENOUS | Status: AC
  Administered 2019-11-12: 120 mg via INTRAVENOUS
  Filled 2019-11-12: qty 6

## 2019-11-12 MED ORDER — SODIUM CHLORIDE 0.9 % IV SOLN
Freq: Once | INTRAVENOUS | Status: AC
  Filled 2019-11-12: qty 250

## 2019-11-12 MED ORDER — SODIUM CHLORIDE 0.9 % IV SOLN
10.0000 mg | Freq: Once | INTRAVENOUS | Status: AC
  Administered 2019-11-12: 10 mg via INTRAVENOUS
  Filled 2019-11-12: qty 10

## 2019-11-13 ENCOUNTER — Other Ambulatory Visit: Payer: Self-pay

## 2019-11-13 ENCOUNTER — Inpatient Hospital Stay: Payer: Medicare Other

## 2019-11-13 VITALS — BP 122/81 | HR 98 | Temp 97.7°F | Resp 18

## 2019-11-13 DIAGNOSIS — I10 Essential (primary) hypertension: Secondary | ICD-10-CM | POA: Diagnosis not present

## 2019-11-13 DIAGNOSIS — I4891 Unspecified atrial fibrillation: Secondary | ICD-10-CM | POA: Diagnosis not present

## 2019-11-13 DIAGNOSIS — Z87891 Personal history of nicotine dependence: Secondary | ICD-10-CM | POA: Diagnosis not present

## 2019-11-13 DIAGNOSIS — Z7901 Long term (current) use of anticoagulants: Secondary | ICD-10-CM | POA: Diagnosis not present

## 2019-11-13 DIAGNOSIS — J90 Pleural effusion, not elsewhere classified: Secondary | ICD-10-CM | POA: Diagnosis not present

## 2019-11-13 DIAGNOSIS — Z5112 Encounter for antineoplastic immunotherapy: Secondary | ICD-10-CM | POA: Diagnosis not present

## 2019-11-13 DIAGNOSIS — G893 Neoplasm related pain (acute) (chronic): Secondary | ICD-10-CM | POA: Diagnosis not present

## 2019-11-13 DIAGNOSIS — D6481 Anemia due to antineoplastic chemotherapy: Secondary | ICD-10-CM | POA: Diagnosis not present

## 2019-11-13 DIAGNOSIS — T451X5A Adverse effect of antineoplastic and immunosuppressive drugs, initial encounter: Secondary | ICD-10-CM | POA: Diagnosis not present

## 2019-11-13 DIAGNOSIS — C3490 Malignant neoplasm of unspecified part of unspecified bronchus or lung: Secondary | ICD-10-CM | POA: Diagnosis not present

## 2019-11-13 DIAGNOSIS — C781 Secondary malignant neoplasm of mediastinum: Secondary | ICD-10-CM | POA: Diagnosis not present

## 2019-11-13 DIAGNOSIS — E871 Hypo-osmolality and hyponatremia: Secondary | ICD-10-CM | POA: Diagnosis not present

## 2019-11-13 DIAGNOSIS — D696 Thrombocytopenia, unspecified: Secondary | ICD-10-CM | POA: Diagnosis not present

## 2019-11-13 DIAGNOSIS — C7951 Secondary malignant neoplasm of bone: Secondary | ICD-10-CM | POA: Diagnosis not present

## 2019-11-13 DIAGNOSIS — Z79899 Other long term (current) drug therapy: Secondary | ICD-10-CM | POA: Diagnosis not present

## 2019-11-13 DIAGNOSIS — Z5111 Encounter for antineoplastic chemotherapy: Secondary | ICD-10-CM | POA: Diagnosis not present

## 2019-11-13 MED ORDER — SODIUM CHLORIDE 0.9 % IV SOLN
10.0000 mg | Freq: Once | INTRAVENOUS | Status: AC
  Administered 2019-11-13: 10 mg via INTRAVENOUS
  Filled 2019-11-13: qty 10

## 2019-11-13 MED ORDER — SODIUM CHLORIDE 0.9 % IV SOLN
65.0000 mg/m2 | Freq: Once | INTRAVENOUS | Status: AC
  Administered 2019-11-13: 120 mg via INTRAVENOUS
  Filled 2019-11-13: qty 6

## 2019-11-13 MED ORDER — SODIUM CHLORIDE 0.9 % IV SOLN
Freq: Once | INTRAVENOUS | Status: AC
  Filled 2019-11-13: qty 250

## 2019-11-13 MED ORDER — HEPARIN SOD (PORK) LOCK FLUSH 100 UNIT/ML IV SOLN
500.0000 [IU] | Freq: Once | INTRAVENOUS | Status: AC | PRN
  Administered 2019-11-13: 500 [IU]
  Filled 2019-11-13: qty 5

## 2019-11-14 ENCOUNTER — Ambulatory Visit: Payer: Medicare Other

## 2019-11-14 ENCOUNTER — Inpatient Hospital Stay: Payer: Medicare Other | Attending: Hematology and Oncology

## 2019-11-14 DIAGNOSIS — D6481 Anemia due to antineoplastic chemotherapy: Secondary | ICD-10-CM | POA: Insufficient documentation

## 2019-11-14 DIAGNOSIS — C3490 Malignant neoplasm of unspecified part of unspecified bronchus or lung: Secondary | ICD-10-CM

## 2019-11-14 DIAGNOSIS — Z5189 Encounter for other specified aftercare: Secondary | ICD-10-CM | POA: Diagnosis not present

## 2019-11-14 DIAGNOSIS — Z87891 Personal history of nicotine dependence: Secondary | ICD-10-CM | POA: Insufficient documentation

## 2019-11-14 DIAGNOSIS — D701 Agranulocytosis secondary to cancer chemotherapy: Secondary | ICD-10-CM | POA: Insufficient documentation

## 2019-11-14 DIAGNOSIS — T451X5A Adverse effect of antineoplastic and immunosuppressive drugs, initial encounter: Secondary | ICD-10-CM | POA: Diagnosis not present

## 2019-11-14 DIAGNOSIS — I10 Essential (primary) hypertension: Secondary | ICD-10-CM | POA: Diagnosis not present

## 2019-11-14 DIAGNOSIS — I4891 Unspecified atrial fibrillation: Secondary | ICD-10-CM | POA: Insufficient documentation

## 2019-11-14 DIAGNOSIS — Z79899 Other long term (current) drug therapy: Secondary | ICD-10-CM | POA: Diagnosis not present

## 2019-11-14 DIAGNOSIS — Z7901 Long term (current) use of anticoagulants: Secondary | ICD-10-CM | POA: Diagnosis not present

## 2019-11-14 MED ORDER — PEGFILGRASTIM-CBQV 6 MG/0.6ML ~~LOC~~ SOSY
6.0000 mg | PREFILLED_SYRINGE | Freq: Once | SUBCUTANEOUS | Status: AC
  Administered 2019-11-14: 6 mg via SUBCUTANEOUS
  Filled 2019-11-14: qty 0.6

## 2019-11-20 ENCOUNTER — Other Ambulatory Visit: Payer: Self-pay

## 2019-11-20 ENCOUNTER — Other Ambulatory Visit: Payer: Medicare Other

## 2019-11-20 ENCOUNTER — Telehealth: Payer: Self-pay

## 2019-11-20 ENCOUNTER — Inpatient Hospital Stay: Payer: Medicare Other

## 2019-11-20 DIAGNOSIS — Z95828 Presence of other vascular implants and grafts: Secondary | ICD-10-CM | POA: Diagnosis not present

## 2019-11-20 DIAGNOSIS — T451X5A Adverse effect of antineoplastic and immunosuppressive drugs, initial encounter: Secondary | ICD-10-CM | POA: Diagnosis not present

## 2019-11-20 DIAGNOSIS — D61810 Antineoplastic chemotherapy induced pancytopenia: Secondary | ICD-10-CM | POA: Diagnosis not present

## 2019-11-20 DIAGNOSIS — Z20822 Contact with and (suspected) exposure to covid-19: Secondary | ICD-10-CM | POA: Diagnosis not present

## 2019-11-20 DIAGNOSIS — I484 Atypical atrial flutter: Secondary | ICD-10-CM | POA: Diagnosis not present

## 2019-11-20 DIAGNOSIS — I10 Essential (primary) hypertension: Secondary | ICD-10-CM | POA: Diagnosis not present

## 2019-11-20 DIAGNOSIS — I4891 Unspecified atrial fibrillation: Secondary | ICD-10-CM | POA: Diagnosis not present

## 2019-11-20 DIAGNOSIS — Z87891 Personal history of nicotine dependence: Secondary | ICD-10-CM | POA: Diagnosis not present

## 2019-11-20 DIAGNOSIS — Z7901 Long term (current) use of anticoagulants: Secondary | ICD-10-CM | POA: Diagnosis not present

## 2019-11-20 DIAGNOSIS — T887XXA Unspecified adverse effect of drug or medicament, initial encounter: Secondary | ICD-10-CM | POA: Diagnosis not present

## 2019-11-20 DIAGNOSIS — R404 Transient alteration of awareness: Secondary | ICD-10-CM | POA: Diagnosis not present

## 2019-11-20 DIAGNOSIS — Z743 Need for continuous supervision: Secondary | ICD-10-CM | POA: Diagnosis not present

## 2019-11-20 DIAGNOSIS — D649 Anemia, unspecified: Secondary | ICD-10-CM

## 2019-11-20 DIAGNOSIS — D6481 Anemia due to antineoplastic chemotherapy: Secondary | ICD-10-CM | POA: Diagnosis not present

## 2019-11-20 DIAGNOSIS — T401X1A Poisoning by heroin, accidental (unintentional), initial encounter: Secondary | ICD-10-CM | POA: Diagnosis not present

## 2019-11-20 DIAGNOSIS — D701 Agranulocytosis secondary to cancer chemotherapy: Secondary | ICD-10-CM | POA: Diagnosis not present

## 2019-11-20 DIAGNOSIS — D61818 Other pancytopenia: Secondary | ICD-10-CM | POA: Diagnosis not present

## 2019-11-20 DIAGNOSIS — I517 Cardiomegaly: Secondary | ICD-10-CM | POA: Diagnosis not present

## 2019-11-20 DIAGNOSIS — T50904A Poisoning by unspecified drugs, medicaments and biological substances, undetermined, initial encounter: Secondary | ICD-10-CM | POA: Diagnosis not present

## 2019-11-20 DIAGNOSIS — D6959 Other secondary thrombocytopenia: Secondary | ICD-10-CM | POA: Diagnosis not present

## 2019-11-20 DIAGNOSIS — C3490 Malignant neoplasm of unspecified part of unspecified bronchus or lung: Secondary | ICD-10-CM | POA: Diagnosis not present

## 2019-11-20 DIAGNOSIS — J690 Pneumonitis due to inhalation of food and vomit: Secondary | ICD-10-CM | POA: Diagnosis not present

## 2019-11-20 DIAGNOSIS — R0902 Hypoxemia: Secondary | ICD-10-CM | POA: Diagnosis not present

## 2019-11-20 DIAGNOSIS — J90 Pleural effusion, not elsewhere classified: Secondary | ICD-10-CM | POA: Diagnosis not present

## 2019-11-20 DIAGNOSIS — R0602 Shortness of breath: Secondary | ICD-10-CM | POA: Diagnosis not present

## 2019-11-20 DIAGNOSIS — Z79899 Other long term (current) drug therapy: Secondary | ICD-10-CM | POA: Diagnosis not present

## 2019-11-20 DIAGNOSIS — D696 Thrombocytopenia, unspecified: Secondary | ICD-10-CM

## 2019-11-20 DIAGNOSIS — R4020 Unspecified coma: Secondary | ICD-10-CM | POA: Diagnosis not present

## 2019-11-20 LAB — BASIC METABOLIC PANEL
Anion gap: 8 (ref 5–15)
BUN: 12 mg/dL (ref 8–23)
CO2: 28 mmol/L (ref 22–32)
Calcium: 8.8 mg/dL — ABNORMAL LOW (ref 8.9–10.3)
Chloride: 101 mmol/L (ref 98–111)
Creatinine, Ser: 0.62 mg/dL (ref 0.61–1.24)
GFR calc Af Amer: >60 mL/min (ref >60)
GFR calc non Af Amer: >60 mL/min (ref >60)
Glucose, Bld: 132 mg/dL — ABNORMAL HIGH (ref 70–99)
Potassium: 3.6 mmol/L (ref 3.5–5.1)
Sodium: 137 mmol/L (ref 135–145)

## 2019-11-20 LAB — CBC WITH DIFFERENTIAL/PLATELET
Abs Immature Granulocytes: 0.04 10*3/uL (ref 0.00–0.07)
Basophils Absolute: 0 10*3/uL (ref 0.0–0.1)
Basophils Relative: 1 %
Eosinophils Absolute: 0 10*3/uL (ref 0.0–0.5)
Eosinophils Relative: 0 %
HCT: 22.1 % — ABNORMAL LOW (ref 39.0–52.0)
Hemoglobin: 7.4 g/dL — ABNORMAL LOW (ref 13.0–17.0)
Immature Granulocytes: 3 %
Lymphocytes Relative: 46 %
Lymphs Abs: 0.7 10*3/uL (ref 0.7–4.0)
MCH: 33 pg (ref 26.0–34.0)
MCHC: 33.5 g/dL (ref 30.0–36.0)
MCV: 98.7 fL (ref 80.0–100.0)
Monocytes Absolute: 0.1 10*3/uL (ref 0.1–1.0)
Monocytes Relative: 6 %
Neutro Abs: 0.6 10*3/uL — ABNORMAL LOW (ref 1.7–7.7)
Neutrophils Relative %: 44 %
Platelets: 97 10*3/uL — ABNORMAL LOW (ref 150–400)
RBC: 2.24 MIL/uL — ABNORMAL LOW (ref 4.22–5.81)
RDW: 15.9 % — ABNORMAL HIGH (ref 11.5–15.5)
WBC: 1.4 10*3/uL — CL (ref 4.0–10.5)
nRBC: 0 % (ref 0.0–0.2)

## 2019-11-20 LAB — SAMPLE TO BLOOD BANK

## 2019-11-20 LAB — MAGNESIUM: Magnesium: 1.6 mg/dL — ABNORMAL LOW (ref 1.7–2.4)

## 2019-11-20 NOTE — Telephone Encounter (Signed)
Patient states that he will pick up OTC magnesium 400mg  and call us if he has any diarrhea. Reviewed neutropenic precautions.  Patient declined transfusion. Advised patient of platelet levels. He states that he has only been taking eliquis once a day rather than twice a day because it makes him dizzy. He says he will start back on twice a day. Informed patient to come in for blood work on Friday to recheck platelets. He is aware that Shirlean Mylar will call to schedule. He states that his port is causing pain at the port site and it in his arm (7/10) and would like something for this.

## 2019-11-20 NOTE — Telephone Encounter (Signed)
-----   Message from Lequita Asal, MD sent at 11/20/2019 11:45 AM EDT -----  Please call patient.  Mg is low.  Magnesium 2 gm IV or he can take oral magnesium 400 mg a day (unless he has diarrhea).  Review neutropenic precautions.  Hemoglobin is 7.4.  Please offer transfusion.  Platelet count 97,000.  Last week 462,000.  He is on Eliquis.  Need platelets > 50,000 on anticoagulation.  Check CBC on Friday.  M  ----- Message ----- From: Buel Ream, [facility] [place] Sent: 11/20/2019  11:38 AM EDT To: Lequita Asal, MD

## 2019-11-21 ENCOUNTER — Telehealth: Payer: Self-pay | Admitting: *Deleted

## 2019-11-21 NOTE — Telephone Encounter (Signed)
Called pt to let him know that when he comes to [facility] clinic for labs in am He will be added on to see a NP to evaluate him after his labs are drawn. Patient said he feels great but he agreed to stay for Possible fluids or magnesium if needed

## 2019-11-22 ENCOUNTER — Inpatient Hospital Stay: Payer: Medicare Other

## 2019-11-22 ENCOUNTER — Emergency Department: Payer: Medicare Other

## 2019-11-22 ENCOUNTER — Other Ambulatory Visit: Payer: Self-pay

## 2019-11-22 ENCOUNTER — Inpatient Hospital Stay
Admission: EM | Admit: 2019-11-22 | Discharge: 2019-11-25 | DRG: 917 | Disposition: A | Discharge status: Home / Self Care | Payer: Medicare Other | Attending: Internal Medicine | Admitting: Internal Medicine

## 2019-11-22 ENCOUNTER — Inpatient Hospital Stay (HOSPITAL_BASED_OUTPATIENT_CLINIC_OR_DEPARTMENT_OTHER): Payer: Medicare Other | Admitting: Nurse Practitioner

## 2019-11-22 VITALS — BP 135/84 | HR 98 | Temp 97.8°F | Resp 20 | Wt 132.2 lb

## 2019-11-22 DIAGNOSIS — Y92009 Unspecified place in unspecified non-institutional (private) residence as the place of occurrence of the external cause: Secondary | ICD-10-CM

## 2019-11-22 DIAGNOSIS — T451X5A Adverse effect of antineoplastic and immunosuppressive drugs, initial encounter: Secondary | ICD-10-CM | POA: Diagnosis present

## 2019-11-22 DIAGNOSIS — Z95828 Presence of other vascular implants and grafts: Secondary | ICD-10-CM

## 2019-11-22 DIAGNOSIS — D701 Agranulocytosis secondary to cancer chemotherapy: Secondary | ICD-10-CM

## 2019-11-22 DIAGNOSIS — I4891 Unspecified atrial fibrillation: Secondary | ICD-10-CM | POA: Diagnosis present

## 2019-11-22 DIAGNOSIS — Z7901 Long term (current) use of anticoagulants: Secondary | ICD-10-CM

## 2019-11-22 DIAGNOSIS — D696 Thrombocytopenia, unspecified: Secondary | ICD-10-CM

## 2019-11-22 DIAGNOSIS — Z743 Need for continuous supervision: Secondary | ICD-10-CM | POA: Diagnosis not present

## 2019-11-22 DIAGNOSIS — C3490 Malignant neoplasm of unspecified part of unspecified bronchus or lung: Secondary | ICD-10-CM

## 2019-11-22 DIAGNOSIS — R404 Transient alteration of awareness: Secondary | ICD-10-CM | POA: Diagnosis not present

## 2019-11-22 DIAGNOSIS — D6959 Other secondary thrombocytopenia: Secondary | ICD-10-CM | POA: Diagnosis not present

## 2019-11-22 DIAGNOSIS — T887XXA Unspecified adverse effect of drug or medicament, initial encounter: Secondary | ICD-10-CM | POA: Diagnosis not present

## 2019-11-22 DIAGNOSIS — R4020 Unspecified coma: Secondary | ICD-10-CM | POA: Diagnosis not present

## 2019-11-22 DIAGNOSIS — D6481 Anemia due to antineoplastic chemotherapy: Secondary | ICD-10-CM

## 2019-11-22 DIAGNOSIS — J690 Pneumonitis due to inhalation of food and vomit: Secondary | ICD-10-CM | POA: Diagnosis present

## 2019-11-22 DIAGNOSIS — R000 Tachycardia, unspecified: Secondary | ICD-10-CM | POA: Diagnosis present

## 2019-11-22 DIAGNOSIS — F10239 Alcohol dependence with withdrawal, unspecified: Secondary | ICD-10-CM | POA: Diagnosis present

## 2019-11-22 DIAGNOSIS — F419 Anxiety disorder, unspecified: Secondary | ICD-10-CM | POA: Diagnosis present

## 2019-11-22 DIAGNOSIS — J90 Pleural effusion, not elsewhere classified: Secondary | ICD-10-CM | POA: Diagnosis not present

## 2019-11-22 DIAGNOSIS — I517 Cardiomegaly: Secondary | ICD-10-CM | POA: Diagnosis not present

## 2019-11-22 DIAGNOSIS — Z85118 Personal history of other malignant neoplasm of bronchus and lung: Secondary | ICD-10-CM

## 2019-11-22 DIAGNOSIS — D61810 Antineoplastic chemotherapy induced pancytopenia: Secondary | ICD-10-CM | POA: Diagnosis present

## 2019-11-22 DIAGNOSIS — T401X1A Poisoning by heroin, accidental (unintentional), initial encounter: Principal | ICD-10-CM | POA: Diagnosis present

## 2019-11-22 DIAGNOSIS — Z20822 Contact with and (suspected) exposure to covid-19: Secondary | ICD-10-CM | POA: Diagnosis present

## 2019-11-22 DIAGNOSIS — I484 Atypical atrial flutter: Secondary | ICD-10-CM | POA: Diagnosis present

## 2019-11-22 DIAGNOSIS — Y906 Blood alcohol level of 120-199 mg/100 ml: Secondary | ICD-10-CM | POA: Diagnosis present

## 2019-11-22 DIAGNOSIS — Z87891 Personal history of nicotine dependence: Secondary | ICD-10-CM

## 2019-11-22 DIAGNOSIS — T50904A Poisoning by unspecified drugs, medicaments and biological substances, undetermined, initial encounter: Secondary | ICD-10-CM | POA: Diagnosis not present

## 2019-11-22 DIAGNOSIS — Z79899 Other long term (current) drug therapy: Secondary | ICD-10-CM

## 2019-11-22 DIAGNOSIS — R0602 Shortness of breath: Secondary | ICD-10-CM | POA: Diagnosis not present

## 2019-11-22 DIAGNOSIS — R0902 Hypoxemia: Secondary | ICD-10-CM

## 2019-11-22 DIAGNOSIS — I10 Essential (primary) hypertension: Secondary | ICD-10-CM | POA: Diagnosis present

## 2019-11-22 DIAGNOSIS — D61818 Other pancytopenia: Secondary | ICD-10-CM

## 2019-11-22 LAB — COMPREHENSIVE METABOLIC PANEL
ALT: 18 U/L (ref 0–44)
AST: 27 U/L (ref 15–41)
Albumin: 3.9 g/dL (ref 3.5–5.0)
Alkaline Phosphatase: 113 U/L (ref 38–126)
Anion gap: 15 (ref 5–15)
BUN: 10 mg/dL (ref 8–23)
CO2: 23 mmol/L (ref 22–32)
Calcium: 9 mg/dL (ref 8.9–10.3)
Chloride: 98 mmol/L (ref 98–111)
Creatinine, Ser: 0.85 mg/dL (ref 0.61–1.24)
GFR calc Af Amer: >60 mL/min (ref >60)
GFR calc non Af Amer: >60 mL/min (ref >60)
Glucose, Bld: 159 mg/dL — ABNORMAL HIGH (ref 70–99)
Potassium: 3.4 mmol/L — ABNORMAL LOW (ref 3.5–5.1)
Sodium: 136 mmol/L (ref 135–145)
Total Bilirubin: 0.6 mg/dL (ref 0.3–1.2)
Total Protein: 7.5 g/dL (ref 6.5–8.1)

## 2019-11-22 LAB — CBC WITH DIFFERENTIAL/PLATELET
Abs Immature Granulocytes: 0.1 10*3/uL — ABNORMAL HIGH (ref 0.00–0.07)
Abs Immature Granulocytes: 0.62 10*3/uL — ABNORMAL HIGH (ref 0.00–0.07)
Band Neutrophils: 15 %
Basophils Absolute: 0 10*3/uL (ref 0.0–0.1)
Basophils Absolute: 0 10*3/uL (ref 0.0–0.1)
Basophils Relative: 0 %
Basophils Relative: 0 %
Eosinophils Absolute: 0 10*3/uL (ref 0.0–0.5)
Eosinophils Absolute: 0 10*3/uL (ref 0.0–0.5)
Eosinophils Relative: 0 %
Eosinophils Relative: 0 %
HCT: 22.9 % — ABNORMAL LOW (ref 39.0–52.0)
HCT: 23.6 % — ABNORMAL LOW (ref 39.0–52.0)
Hemoglobin: 7.7 g/dL — ABNORMAL LOW (ref 13.0–17.0)
Hemoglobin: 7.9 g/dL — ABNORMAL LOW (ref 13.0–17.0)
Immature Granulocytes: 7 %
Lymphocytes Relative: 45 %
Lymphocytes Relative: 33 %
Lymphs Abs: 1.1 10*3/uL (ref 0.7–4.0)
Lymphs Abs: 2.8 10*3/uL (ref 0.7–4.0)
MCH: 33.3 pg (ref 26.0–34.0)
MCH: 33.8 pg (ref 26.0–34.0)
MCHC: 33.6 g/dL (ref 30.0–36.0)
MCHC: 33.5 g/dL (ref 30.0–36.0)
MCV: 99.1 fL (ref 80.0–100.0)
MCV: 100.9 fL — ABNORMAL HIGH (ref 80.0–100.0)
Metamyelocytes Relative: 6 %
Monocytes Absolute: 0.1 10*3/uL (ref 0.1–1.0)
Monocytes Absolute: 1.3 10*3/uL — ABNORMAL HIGH (ref 0.1–1.0)
Monocytes Relative: 3 %
Monocytes Relative: 15 %
Neutro Abs: 1.1 10*3/uL — ABNORMAL LOW (ref 1.7–7.7)
Neutro Abs: 3.8 10*3/uL (ref 1.7–7.7)
Neutrophils Relative %: 31 %
Neutrophils Relative %: 45 %
Platelets: 55 10*3/uL — ABNORMAL LOW (ref 150–400)
Platelets: 58 10*3/uL — ABNORMAL LOW (ref 150–400)
RBC: 2.31 MIL/uL — ABNORMAL LOW (ref 4.22–5.81)
RBC: 2.34 MIL/uL — ABNORMAL LOW (ref 4.22–5.81)
RDW: 15.9 % — ABNORMAL HIGH (ref 11.5–15.5)
RDW: 16.7 % — ABNORMAL HIGH (ref 11.5–15.5)
Smear Review: NORMAL
WBC Morphology: INCREASED
WBC: 2.4 10*3/uL — ABNORMAL LOW (ref 4.0–10.5)
WBC: 8.6 10*3/uL (ref 4.0–10.5)
nRBC: 1.2 % — ABNORMAL HIGH (ref 0.0–0.2)
nRBC: 2.6 % — ABNORMAL HIGH (ref 0.0–0.2)

## 2019-11-22 LAB — BLOOD GAS, VENOUS
Acid-base deficit: 6.8 mmol/L — ABNORMAL HIGH (ref 0.0–2.0)
Bicarbonate: 18.5 mmol/L — ABNORMAL LOW (ref 20.0–28.0)
O2 Saturation: 74.6 %
Patient temperature: 37
pCO2, Ven: 35 mmHg — ABNORMAL LOW (ref 44.0–60.0)
pH, Ven: 7.33 (ref 7.250–7.430)
pO2, Ven: 43 mmHg (ref 32.0–45.0)

## 2019-11-22 LAB — SAMPLE TO BLOOD BANK

## 2019-11-22 LAB — ETHANOL: Alcohol, Ethyl (B): 140 mg/dL — ABNORMAL HIGH (ref <10)

## 2019-11-22 MED ORDER — ONDANSETRON HCL 4 MG/2ML IJ SOLN
4.0000 mg | Freq: Once | INTRAMUSCULAR | Status: AC
  Administered 2019-11-22: 4 mg via INTRAVENOUS
  Filled 2019-11-22: qty 2

## 2019-11-22 MED ORDER — IOHEXOL 350 MG/ML SOLN
75.0000 mL | Freq: Once | INTRAVENOUS | Status: AC | PRN
  Administered 2019-11-22: 75 mL via INTRAVENOUS

## 2019-11-22 NOTE — Progress Notes (Signed)
Symptom Management [place]  [phone] [phone]  Patient Care Team: Patient, No Pcp Per as PCP - General ([place]) Telford Nab, RN as Registered Nurse Lequita Asal, MD as Referring Physician (Hematology and Oncology) Erby Pian, MD as Referring Physician (Specialist) Teodoro Spray, MD as Consulting Physician (Cardiology) Anthonette Legato, MD as Consulting Physician (Nephrology)   Name of the patient: Scott Jordan  [id number]  02-Jan-1951   Date of visit: 11/22/19  Diagnosis-extensive stage small cell lung cancer  Chief complaint/ Reason for visit-carbo-etoposide-Tecentriq  Heme/Onc history:  Oncology History  Small cell lung cancer ([place])  08/23/2019 Initial Diagnosis   Small cell lung cancer ([place])   09/02/2019 -  Chemotherapy   The patient had palonosetron (ALOXI) injection 0.25 mg, 0.25 mg, Intravenous,  Once, 4 of 4 cycles Administration: 0.25 mg (09/02/2019), 0.25 mg (09/23/2019), 0.25 mg (10/21/2019), 0.25 mg (11/11/2019) pegfilgrastim-cbqv (UDENYCA) injection 6 mg, 6 mg, Subcutaneous, Once, 4 of 4 cycles Administration: 6 mg (09/05/2019), 6 mg (09/26/2019), 6 mg (10/24/2019), 6 mg (11/14/2019) CARBOplatin (PARAPLATIN) 440 mg in sodium chloride 0.9 % 250 mL chemo infusion, 440 mg (100 % of original dose 444 mg), Intravenous,  Once, 4 of 4 cycles Dose modification:   (original dose 444 mg, Cycle 1),   (original dose 444 mg, Cycle 2),   (original dose 444 mg, Cycle 3, Reason: Other (see comments), Comment: to sign and release),   (original dose 439.5 mg, Cycle 4) Administration: 440 mg (09/02/2019), 440 mg (09/23/2019), 440 mg (10/21/2019), 440 mg (11/11/2019) etoposide (VEPESID) 180 mg in sodium chloride 0.9 % 500 mL chemo infusion, 100 mg/m2 = 180 mg, Intravenous,  Once, 4 of 4 cycles Dose modification: 140 mg (original dose 100 mg/m2, Cycle 1, Reason: Provider Judgment), 80 mg/m2 (original dose 100 mg/m2, Cycle 2,  Reason: Provider Judgment), 70 mg/m2 (original dose 100 mg/m2, Cycle 2, Reason: Provider Judgment, Comment: slight adjustment because of pancytopenia with cycle #1), 70 mg/m2 (original dose 100 mg/m2, Cycle 2, Reason: Provider Judgment), 120 mg (original dose 100 mg/m2, Cycle 2, Reason: Provider Judgment), 50 mg/m2 (original dose 100 mg/m2, Cycle 3, Reason: Provider Judgment, Comment: tolerated 120 mg with last cycle), 60 mg/m2 (original dose 100 mg/m2, Cycle 3, Reason: Provider Judgment, Comment: 1), 65 mg/m2 (original dose 100 mg/m2, Cycle 3, Reason: Provider Judgment), 65 mg/m2 (original dose 100 mg/m2, Cycle 3, Reason: Provider Judgment, Comment: tolerated with cycle #2) Administration: 140 mg (09/03/2019), 140 mg (09/04/2019), 120 mg (09/24/2019), 120 mg (09/25/2019), 120 mg (10/21/2019), 120 mg (10/22/2019), 120 mg (10/23/2019), 120 mg (11/11/2019), 120 mg (11/12/2019), 120 mg (11/13/2019) fosaprepitant (EMEND) 150 mg in sodium chloride 0.9 % 145 mL IVPB, 150 mg, Intravenous,  Once, 4 of 4 cycles Administration: 150 mg (09/02/2019), 150 mg (09/23/2019), 150 mg (10/21/2019), 150 mg (11/11/2019) atezolizumab (TECENTRIQ) 1,200 mg in sodium chloride 0.9 % 250 mL chemo infusion, 1,200 mg, Intravenous, Once, 4 of 8 cycles Administration: 1,200 mg (09/02/2019), 1,200 mg (09/23/2019), 1,200 mg (10/21/2019), 1,200 mg (11/11/2019)  for chemotherapy treatment.      Interval history-patient with extensive stage I cancer currently receiving carbo-etoposide-Tecentriq chemotherapy presents to symptom management clinic for symptomatic anemia.  Patient received cycle 4 of treatment on 11/11/2019-11/13/2019 with Udenyca support on 11/14/2019.  [facility] labs were checked on 11/20/2019 and he was found to be neutropenic with ANC 600, thrombocytopenic with platelet count 97, and anemic with hemoglobin of 7.4.  Additionally, magnesium was decreased at 1.6.  He has been taking  magnesium supplement and declined blood transfusion and/or magnesium  transfusion.  Today, he says that he feels well, says the best that he has felt in a while.  No fevers or chills.  Energy level is good.  Appetite is good.  Denies chest pain or shortness of breath.  No nausea, vomiting, constipation, or diarrhea.  No urinary complaints.  No further specific complaints today.  He reports compliance with his Eliquis.   ECOG FS:1 - Symptomatic but completely ambulatory  Review of systems- Review of Systems  Constitutional: Negative for chills, fever, malaise/fatigue and weight loss.  HENT: Negative for hearing loss, nosebleeds, sore throat and tinnitus.   Eyes: Negative for blurred vision and double vision.  Respiratory: Negative for cough, hemoptysis, shortness of breath and wheezing.   Cardiovascular: Negative for chest pain, palpitations and leg swelling.  Gastrointestinal: Negative for abdominal pain, blood in stool, constipation, diarrhea, melena, nausea and vomiting.  Genitourinary: Negative for dysuria and urgency.  Musculoskeletal: Negative for back pain, falls, joint pain and myalgias.  Skin: Negative for itching and rash.  Neurological: Negative for dizziness, tingling, sensory change, loss of consciousness, weakness and headaches.  Endo/Heme/Allergies: Negative for environmental allergies. Does not bruise/bleed easily.  Psychiatric/Behavioral: Negative for depression. The patient is not nervous/anxious and does not have insomnia.      No Known Allergies  Past Medical History:  Diagnosis Date  . A-fib ([place]) 01/10/2019   pt st this was dx by Dr. Ubaldo Glassing  . Cancer ([place]) 2021   LUNG  . Complication of anesthesia    DIFFICULTY WAKING UP AFTER SURGERY- 20 YRS AGO  . Hypertension   . Lung cancer ([place])   . Substance abuse [facility])     Past Surgical History:  Procedure Laterality Date  . ARM DEBRIDEMENT Left    INCISION AND DEBRIDEMENT LOWER ARM -20 YRS AGO  . BACK SURGERY    . PORTACATH PLACEMENT Left 08/29/2019   Procedure: INSERTION PORT-A-CATH;   Surgeon: Nestor Lewandowsky, MD;  Location: [facility] ORS;  Service: General;  Laterality: Left;    Social History   Socioeconomic History  . Marital status: Single    Spouse name: Not on file  . Number of children: Not on file  . Years of education: Not on file  . Highest education level: Not on file  Occupational History  . Not on file  Tobacco Use  . Smoking status: Former Smoker    Packs/day: 0.50    Types: Cigarettes  . Smokeless tobacco: Never Used  . Tobacco comment: not smoked since 6 weeks ago  Vaping Use  . Vaping Use: Never used  Substance and Sexual Activity  . Alcohol use: Yes    Comment: 84 OZ IN WEEK  . Drug use: Not Currently    Types: Heroin    Comment: heroin WITHIN PAST YEAR  . Sexual activity: Not on file  Other Topics Concern  . Not on file  Social History Narrative  . Not on file   Social Determinants of Health   Financial Resource Strain:   . Difficulty of Paying Living Expenses:   Food Insecurity:   . Worried About Charity fundraiser in the Last Year:   . Arboriculturist in the Last Year:   Transportation Needs:   . Film/video editor (Medical):   Marland Kitchen Lack of Transportation (Non-Medical):   Physical Activity:   . Days of Exercise per Week:   . Minutes of Exercise per Session:   Stress:   .  Feeling of Stress :   Social Connections:   . Frequency of Communication with Friends and Family:   . Frequency of Social Gatherings with Friends and Family:   . Attends Religious Services:   . Active Member of Clubs or Organizations:   . Attends Archivist Meetings:   Marland Kitchen Marital Status:   Intimate Partner Violence:   . Fear of Current or Ex-Partner:   . Emotionally Abused:   Marland Kitchen Physically Abused:   . Sexually Abused:     No family history on file.   Current Outpatient Medications:  .  diltiazem (CARDIZEM CD) 240 MG 24 hr capsule, Take 1 capsule (240 mg total) by mouth daily., Disp: 30 capsule, Rfl: 2 .  ELIQUIS 5 MG TABS tablet, Take 5 mg  by mouth 2 (two) times daily., Disp: , Rfl:  .  escitalopram (LEXAPRO) 10 MG tablet, Take 1 tablet (10 mg total) by mouth daily., Disp: 90 tablet, Rfl: 0 .  feeding supplement, ENSURE ENLIVE, (ENSURE ENLIVE) LIQD, Take 237 mLs by mouth 3 (three) times daily between meals., Disp: 237 mL, Rfl: 12 .  furosemide (LASIX) 20 MG tablet, Take 1 tablet (20 mg total) by mouth daily., Disp: 20 tablet, Rfl: 1 .  HYDROcodone-acetaminophen (NORCO/VICODIN) 5-325 MG tablet, Take 1 tablet by mouth every 6 (six) hours as needed for moderate pain., Disp: 30 tablet, Rfl: 0 .  lidocaine-prilocaine (EMLA) cream, Apply to affected area once, Disp: 30 g, Rfl: 3 .  losartan (COZAAR) 25 MG tablet, Take 1 tablet (25 mg total) by mouth daily., Disp: 30 tablet, Rfl: 2 .  nystatin (MYCOSTATIN) 100000 UNIT/ML suspension, Use as directed 5 mLs (500,000 Units total) in the mouth or throat 4 (four) times daily. Swish and spit (Patient not taking: Reported on 11/11/2019), Disp: 60 mL, Rfl: 0 .  ondansetron (ZOFRAN) 8 MG tablet, Take 1 tablet (8 mg total) by mouth every 8 (eight) hours as needed for refractory nausea / vomiting. Start on day 3 after carboplatin chemo. (Patient not taking: Reported on 10/03/2019), Disp: 30 tablet, Rfl: 1 .  sodium chloride 1 g tablet, Take 1 tablet (1 g total) by mouth 3 (three) times daily with meals., Disp: 90 tablet, Rfl: 1 No current facility-administered medications for this visit.  Facility-Administered Medications Ordered in Other Visits:  .  heparin lock flush 100 unit/mL, 500 Units, Intravenous, Once, Corcoran, Melissa C, MD .  sodium chloride flush (NS) 0.9 % injection 10 mL, 10 mL, Intravenous, PRN, Mike Gip, Melissa C, MD, 10 mL at 11/11/19 8127  Physical exam:  Vitals:   11/22/19 1103  BP: 135/84  Pulse: 98  Resp: 20  Temp: 97.8 F (36.6 C)  TempSrc: Tympanic  Weight: 132 lb 3.2 oz (60 kg)   Physical Exam Constitutional:      General: He is not in acute distress.    Appearance:  He is well-developed.     Comments: Thin built  HENT:     Head: Normocephalic and atraumatic.     Nose: Nose normal.     Mouth/Throat:     Pharynx: No oropharyngeal exudate.  Eyes:     General: No scleral icterus.    Conjunctiva/sclera: Conjunctivae normal.  Cardiovascular:     Rate and Rhythm: Normal rate and regular rhythm.     Heart sounds: Normal heart sounds.  Pulmonary:     Effort: Pulmonary effort is normal.     Breath sounds: Normal breath sounds. No wheezing.  Abdominal:  General: Bowel sounds are normal. There is no distension.     Palpations: Abdomen is soft.     Tenderness: There is no abdominal tenderness.  Musculoskeletal:        General: Normal range of motion.     Cervical back: Normal range of motion and neck supple.  Skin:    General: Skin is warm and dry.     Coloration: Skin is pale.  Neurological:     Mental Status: He is alert and oriented to person, place, and time.  Psychiatric:        Mood and Affect: Mood normal.        Behavior: Behavior normal.      CMP Latest Ref Rng & Units 11/20/2019  Glucose 70 - 99 mg/dL 132(H)  BUN 8 - 23 mg/dL 12  Creatinine 0.61 - 1.24 mg/dL 0.62  Sodium 135 - 145 mmol/L 137  Potassium 3.5 - 5.1 mmol/L 3.6  Chloride 98 - 111 mmol/L 101  CO2 22 - 32 mmol/L 28  Calcium 8.9 - 10.3 mg/dL 8.8(L)  Total Protein 6.5 - 8.1 g/dL -  Total Bilirubin 0.3 - 1.2 mg/dL -  Alkaline Phos 38 - 126 U/L -  AST 15 - 41 U/L -  ALT 0 - 44 U/L -   CBC Latest Ref Rng & Units 11/20/2019  WBC 4.0 - 10.5 K/uL 1.4(LL)  Hemoglobin 13.0 - 17.0 g/dL 7.4(L)  Hematocrit 39 - 52 % 22.1(L)  Platelets 150 - 400 K/uL 97(L)    No images are attached to the encounter.  No results found.  Assessment and plan- Patient is a 69 y.o. male diagnosed with extensive stage small cell lung cancer currently receiving carboplatin-etoposide-Tecentriq who presents to symptom management clinic for thrombocytopenia, hypomagnesemia, and anemia.  1.   Anemia-secondary to chemotherapy.  Hemoglobin is 7.42 days ago though likely reflective of nadir.  Today, hemoglobin improved to 7.7.  Feels well.  Could consider if less than 8 however given the patient is asymptomatic recommend holding off on transfusion for now.  Would recommend transfusion for hemoglobin less than 7.  2.  Thrombocytopenia-platelets were 97,000 2 days ago and today, have worsened to 55,000.  Per patient no abnormal bleeding or bruising.  On Eliquis.  Continue to monitor.  Recommend holding Eliquis for platelet count less than 50,000.  3.  Hypomagnesemia-magnesium 1.6 on 11/20/2019.  Encouraged oral magnesium supplementation today as he is asymptomatic and tolerating oral supplement well.   4.  Neutropenia-WBC improved to 2.4, ANC improved from 0.6 to 1.1 today.  No interval infections, fevers, chills.  Reviewed neutropenic precautions and recommendations.  Follow-up with Dr. Mike Gip on 11/25/2019 for labs and reevaluation.  Return to clinic if symptoms worsen or do not improve in the interim.   Visit Diagnosis 1. Antineoplastic chemotherapy induced anemia   2. Chemotherapy-induced thrombocytopenia   3. Chemotherapy-induced neutropenia (HCC)   4. Hypomagnesemia     Patient expressed understanding and was in agreement with this plan. He also understands that He can call clinic at any time with any questions, concerns, or complaints.   Thank you for allowing me to participate in the care of this very pleasant patient.   Beckey Rutter, DNP, AGNP-C [facility]  CC: Dr. Mike Gip

## 2019-11-22 NOTE — Progress Notes (Signed)
In [facility] for lab evaluation. Possibility of infusion. States he is feeling good. Told the nurses in [place] he did not want a blood transfusion, he still refusing blood today as he feels good.

## 2019-11-22 NOTE — ED Notes (Signed)
Right forearm dressed with mepilex and gauze.

## 2019-11-22 NOTE — ED Notes (Signed)
Pt taken to CT.

## 2019-11-22 NOTE — ED Provider Notes (Signed)
[facility] Emergency Department Provider Note  ____________________________________________   First MD Initiated Contact with Patient 11/22/19 2056     (approximate)  I have reviewed the triage vital signs and the nursing notes.   HISTORY  Chief Complaint Drug Overdose    HPI Scott Jordan is a 69 y.o. male  With h/o AFib, HTN, lung CA, h/o IVDU, here with overdose. Pt arrives confused, somewhat limiting history. However, after receiving narcan pt admits that he "slipped up" and used heroin today. He is adamant this is the first time he used since last October. He does not remember much after using. Per report, he received 1 mg from a family member at the scene and 2 mg with Fire. By EMS arrival he was awake. He has been tachycardic and confused. Remainder of history is limited 2/2 his confusion.        Past Medical History:  Diagnosis Date  . A-fib ([place]) 01/10/2019   pt st this was dx by Dr. Ubaldo Glassing  . Cancer ([place]) 2021   LUNG  . Complication of anesthesia    DIFFICULTY WAKING UP AFTER SURGERY- 20 YRS AGO  . Hypertension   . Lung cancer ([place])   . Substance abuse [facility])     Patient Active Problem List   Diagnosis Date Noted  . Acute respiratory failure with hypoxia ([place]) 11/23/2019  . Hypomagnesemia 10/21/2019  . Anemia due to antineoplastic chemotherapy 10/13/2019  . Dehydration 10/13/2019  . Thrombocytopenia ([place]) 10/13/2019  . Chemotherapy-induced neutropenia ([place]) 10/10/2019  . Anemia 09/11/2019  . Cancer-related pain 09/05/2019  . Encounter for antineoplastic chemotherapy 09/02/2019  . Encounter for antineoplastic immunotherapy 09/02/2019  . Bone metastasis ([place]) 09/02/2019  . Goals of care, counseling/discussion 08/26/2019  . Small cell lung cancer ([place]) 08/23/2019  . Protein-calorie malnutrition, severe 08/19/2019  . Hyponatremia   . Dyspnea   . Pleural effusion   . Recurrent pleural effusion on left   . Malignant pleural effusion  08/17/2019  . Malnutrition of moderate degree 01/15/2019  . Elevated troponin 01/14/2019    Past Surgical History:  Procedure Laterality Date  . ARM DEBRIDEMENT Left    INCISION AND DEBRIDEMENT LOWER ARM -20 YRS AGO  . BACK SURGERY    . PORTACATH PLACEMENT Left 08/29/2019   Procedure: INSERTION PORT-A-CATH;  Surgeon: Nestor Lewandowsky, MD;  Location: [facility] ORS;  Service: General;  Laterality: Left;    Prior to Admission medications   Medication Sig Start Date End Date Taking? Authorizing Provider  diltiazem (CARDIZEM CD) 240 MG 24 hr capsule Take 1 capsule (240 mg total) by mouth daily. 08/23/19 11/21/19  Enzo Bi, MD  ELIQUIS 5 MG TABS tablet Take 5 mg by mouth 2 (two) times daily. 08/30/19   [provider]  escitalopram (LEXAPRO) 10 MG tablet Take 1 tablet (10 mg total) by mouth daily. Patient not taking: Reported on 11/22/2019 08/30/19   Jacquelin Hawking, NP  feeding supplement, ENSURE ENLIVE, (ENSURE ENLIVE) LIQD Take 237 mLs by mouth 3 (three) times daily between meals. 08/23/19   Enzo Bi, MD  furosemide (LASIX) 20 MG tablet Take 1 tablet (20 mg total) by mouth daily. 09/04/19   Lequita Asal, MD  HYDROcodone-acetaminophen (NORCO/VICODIN) 5-325 MG tablet Take 1 tablet by mouth every 6 (six) hours as needed for moderate pain. 09/02/19   Lequita Asal, MD  lidocaine-prilocaine (EMLA) cream Apply to affected area once 08/26/19   Lequita Asal, MD  losartan (COZAAR) 25 MG tablet Take  1 tablet (25 mg total) by mouth daily. 08/23/19 11/21/19  Enzo Bi, MD  nystatin (MYCOSTATIN) 100000 UNIT/ML suspension Use as directed 5 mLs (500,000 Units total) in the mouth or throat 4 (four) times daily. Swish and spit Patient not taking: Reported on 11/22/2019 09/02/19   Lequita Asal, MD  ondansetron (ZOFRAN) 8 MG tablet Take 1 tablet (8 mg total) by mouth every 8 (eight) hours as needed for refractory nausea / vomiting. Start on day 3 after carboplatin chemo. Patient not taking:  Reported on 10/03/2019 08/26/19   Lequita Asal, MD  sodium chloride 1 g tablet Take 1 tablet (1 g total) by mouth 3 (three) times daily with meals. 09/23/19   Lequita Asal, MD    Allergies Patient has no known allergies.  History reviewed. No pertinent family history.  Social History Social History   Tobacco Use  . Smoking status: Former Smoker    Packs/day: 0.50    Types: Cigarettes  . Smokeless tobacco: Never Used  . Tobacco comment: not smoked since 6 weeks ago  Vaping Use  . Vaping Use: Never used  Substance Use Topics  . Alcohol use: Yes    Comment: 84 OZ IN WEEK  . Drug use: Not Currently    Types: Heroin    Comment: heroin WITHIN PAST YEAR    Review of Systems  Review of Systems  Unable to perform ROS: Mental status change     ____________________________________________  PHYSICAL EXAM:      VITAL SIGNS: ED Triage Vitals  Enc Vitals Group     BP 11/22/19 2100 (!) 139/93     Pulse Rate 11/22/19 2135 (!) 127     Resp 11/22/19 2100 18     Temp 11/22/19 2100 98.6 F (37 C)     Temp Source 11/22/19 2100 Oral     SpO2 11/22/19 2100 95 %     Weight 11/22/19 2102 132 lb 4.4 oz (60 kg)     Height 11/22/19 2102 6\' 2"  (1.88 m)     Head Circumference --      Peak Flow --      Pain Score 11/22/19 2102 0     Pain Loc --      Pain Edu? --      Excl. in [place]? --      Physical Exam Vitals and nursing note reviewed.  Constitutional:      General: He is not in acute distress.    Appearance: He is well-developed.     Comments: Chronically ill appearing, cachectic  HENT:     Head: Normocephalic and atraumatic.  Eyes:     Conjunctiva/sclera: Conjunctivae normal.  Cardiovascular:     Rate and Rhythm: Regular rhythm. Tachycardia present.     Heart sounds: Normal heart sounds.  Pulmonary:     Effort: Pulmonary effort is normal. Tachypnea present. No respiratory distress.     Breath sounds: Rhonchi (bibasilar) present. No rales.  Abdominal:      General: There is no distension.  Musculoskeletal:     Cervical back: Neck supple.  Skin:    General: Skin is warm.     Capillary Refill: Capillary refill takes less than 2 seconds.     Findings: No rash.  Neurological:     Mental Status: He is alert and oriented to person, place, and time.     Motor: No abnormal muscle tone.     Comments: Oriented to person, not place or time. Follows commands. MAE  with at least antigravity strength. No apparent CN deficits.       ____________________________________________   LABS (all labs ordered are listed, but only abnormal results are displayed)  Labs Reviewed  CBC WITH DIFFERENTIAL/PLATELET - Abnormal; Notable for the following components:      Result Value   RBC 2.34 (*)    Hemoglobin 7.9 (*)    HCT 23.6 (*)    MCV 100.9 (*)    RDW 16.7 (*)    Platelets 58 (*)    nRBC 2.6 (*)    Monocytes Absolute 1.3 (*)    Abs Immature Granulocytes 0.62 (*)    All other components within normal limits  COMPREHENSIVE METABOLIC PANEL - Abnormal; Notable for the following components:   Potassium 3.4 (*)    Glucose, Bld 159 (*)    All other components within normal limits  ETHANOL - Abnormal; Notable for the following components:   Alcohol, Ethyl (B) 140 (*)    All other components within normal limits  BLOOD GAS, VENOUS - Abnormal; Notable for the following components:   pCO2, Ven 35 (*)    Bicarbonate 18.5 (*)    Acid-base deficit 6.8 (*)    All other components within normal limits  CULTURE, BLOOD (ROUTINE X 2)  CULTURE, BLOOD (ROUTINE X 2)  SARS CORONAVIRUS 2 BY RT PCR (HOSPITAL ORDER, [facility])  AMMONIA  URINE DRUG SCREEN, QUALITATIVE ([facility] ONLY)  LACTIC ACID, PLASMA  LACTIC ACID, PLASMA    ____________________________________________  EKG:  ________________________________________  RADIOLOGY All imaging, including plain films, CT scans, and ultrasounds, independently reviewed by me, and  interpretations confirmed via formal radiology reads.  ED MD interpretation:   CXR: Small residual left pleural effusion with possible airspace disease CT Head: Old left periventricular lacunar infarct CT Chest: pending  Official radiology report(s): CT Head Wo Contrast  Result Date: 11/22/2019 CLINICAL DATA:  Altered mental status. EXAM: CT HEAD WITHOUT CONTRAST TECHNIQUE: Contiguous axial images were obtained from the base of the skull through the vertex without intravenous contrast. COMPARISON:  MRI 08/21/2019 FINDINGS: Brain: Old lacunar infarct in the left periventricular white matter. No acute intracranial abnormality. Specifically, no hemorrhage, hydrocephalus, mass lesion, acute infarction, or significant intracranial injury. Vascular: No hyperdense vessel or unexpected calcification. Skull: No acute calvarial abnormality. Sinuses/Orbits: Visualized paranasal sinuses and mastoids clear. Orbital soft tissues unremarkable. Other: None IMPRESSION: Old left periventricular lacunar infarct. No acute intracranial abnormality. Electronically Signed   By: Rolm Baptise M.D.   On: 11/22/2019 22:49   CT Angio Chest PE W and/or Wo Contrast  Result Date: 11/23/2019 CLINICAL DATA:  PE suspected, high prob Shortness of breath. Radiologic records demonstrate history of left lower lobe small cell lung cancer. EXAM: CT ANGIOGRAPHY CHEST WITH CONTRAST TECHNIQUE: Multidetector CT imaging of the chest was performed using the standard protocol during bolus administration of intravenous contrast. Multiplanar CT image reconstructions and MIPs were obtained to evaluate the vascular anatomy. CONTRAST:  89mL OMNIPAQUE IOHEXOL 350 MG/ML SOLN COMPARISON:  Radiograph earlier today.  Chest CT 10/11/2019 FINDINGS: Cardiovascular: There are no filling defects within the pulmonary arteries to suggest pulmonary embolus. Upper normal heart size. Coronary artery calcifications. Aortic atherosclerosis without dissection. Left chest  port in place. No pericardial effusion. Mediastinum/Nodes: There is a 12 mm right hilar node, series 4, image 52, increased in size from prior exam. There is an 11 mm low right hilar node series 4, image 58, also increased in size. No enlarged left hilar  lymph nodes. 7 mm AP window nodes. No progressive mediastinal adenopathy. No visualized thyroid nodule. Lungs/Pleura: Emphysema. Small left pleural effusion with slight increase size from prior exam. Pleural fluid tracks into the inter lobar fissure. Volume loss in the left lower lobe with areas of consolidation, not significantly changed from prior. There is new patchy and ground-glass opacity in the dependent right lower lobe, perifissural right middle and right upper lobe. No right pleural effusion. Right lower lobe bronchial thickening with areas of mucous plugging. There is retained mucus within the trachea and to a lesser extent right mainstem bronchus. No pneumothorax. Upper Abdomen: No acute or expected finding. Aortic atherosclerosis. Musculoskeletal: Degenerative change in the spine. No evidence of focal lesion or acute fracture. Probable remote fracture of the mid medial left clavicle, unchanged. Review of the MIP images confirms the above findings. IMPRESSION: 1. No pulmonary embolus. 2. New patchy and ground-glass opacity in the dependent right lower lobe, perifissural right middle and right upper lobe. Findings suspicious for pneumonia, including aspiration or atypical viral organisms such as COVID-19. 3. Small left pleural effusion with slight increase size from prior exam. Volume loss in the left lower lobe with areas of ill-defined consolidation, not significantly changed from prior. 4. Right hilar adenopathy, new from prior exam, nonspecific in this setting. Recommend attention at follow-up. 5. Debris within the trachea and to a lesser extent right mainstem bronchus. Right lower lobe bronchial thickening with areas of mucous plugging. Aortic  Atherosclerosis (ICD10-I70.0) and Emphysema (ICD10-J43.9). Electronically Signed   By: Keith Rake M.D.   On: 11/23/2019 00:06   DG Chest Portable 1 View  Result Date: 11/22/2019 CLINICAL DATA:  Shortness of breath EXAM: PORTABLE CHEST 1 VIEW COMPARISON:  08/17/2019, 08/29/2019, PET CT 08/29/2019 FINDINGS: Left-sided central venous port tip over the cavoatrial region. Mild elevation of left diaphragm. Small residual left pleural effusion, decreased compared to prior radiograph. Airspace disease at the left base. Mild cardiomegaly with aortic atherosclerosis. No pneumothorax. IMPRESSION: Small residual left pleural effusion with residual airspace disease in the left lung base likely corresponding to known mass and adjacent atelectasis or pneumonia. Mild cardiomegaly. Electronically Signed   By: Donavan Foil M.D.   On: 11/22/2019 21:38    ____________________________________________  PROCEDURES   Procedure(s) performed (including Critical Care):  Procedures  ____________________________________________  INITIAL IMPRESSION / MDM / [place] / ED COURSE  As part of my medical decision making, I reviewed the following data within the [place] notes reviewed and incorporated, Old chart reviewed, Notes from prior ED visits, and [place] Controlled Substance Database       *ORYN CASANOVA was evaluated in Emergency Department on 11/23/2019 for the symptoms described in the history of present illness. He was evaluated in the context of the global COVID-19 pandemic, which necessitated consideration that the patient might be at risk for infection with the SARS-CoV-2 virus that causes COVID-19. Institutional protocols and algorithms that pertain to the evaluation of patients at risk for COVID-19 are in a state of rapid change based on information released by regulatory bodies including the CDC and federal and state organizations. These policies and algorithms were  followed during the patient's care in the ED.  Some ED evaluations and interventions may be delayed as a result of limited staffing during the pandemic.*     Medical Decision Making:  69 yo M here with accidental heroin overdose. Pt arrives altered but protecting his airway. He has borderline hypoxia. Labs show baseline  anemia, thrombocytopenia which appears near baseline. He has a mild left shift in his WBC, which is elevated above his baseline, likely stress reaction vs aspiration. Given his persistent tachcardia and borderline hypoxia, will check CT to eval for new PNA or aspiration.   ____________________________________________  FINAL CLINICAL IMPRESSION(S) / ED DIAGNOSES  Final diagnoses:  Hypoxia  Aspiration pneumonia, unspecified aspiration pneumonia type, unspecified laterality, unspecified part of lung ([place])  Accidental overdose of heroin, initial encounter ([place])     MEDICATIONS GIVEN DURING THIS VISIT:  Medications  sodium chloride 0.9 % bolus 1,000 mL (has no administration in time range)  piperacillin-tazobactam (ZOSYN) IVPB 3.375 g (has no administration in time range)  piperacillin-tazobactam (ZOSYN) IVPB 3.375 g (has no administration in time range)  ondansetron (ZOFRAN) injection 4 mg (4 mg Intravenous Given 11/22/19 2340)  iohexol (OMNIPAQUE) 350 MG/ML injection 75 mL (75 mLs Intravenous Contrast Given 11/22/19 2339)     ED Discharge Orders    None       Note:  This document was prepared using Dragon voice recognition software and may include unintentional dictation errors.   Duffy Bruce, MD 11/23/19 [phone]

## 2019-11-22 NOTE — ED Triage Notes (Addendum)
Per EMS, sister heard a "thump" and found pt on the ground at home, pt was unresponsive and has history of heroin use. Sister gave 1 mg narcan IM, [place] police came and gave additional 2 mg narcan intranasally. Pt was alert when EMS picked him up. Pt is alert, oriented to self only. Pt cursing and restless in bed. Pupils pinpoint. Pt denies pain, skin tear noted on right posterior forearm.

## 2019-11-23 DIAGNOSIS — Y92009 Unspecified place in unspecified non-institutional (private) residence as the place of occurrence of the external cause: Secondary | ICD-10-CM | POA: Diagnosis not present

## 2019-11-23 DIAGNOSIS — Z87891 Personal history of nicotine dependence: Secondary | ICD-10-CM | POA: Diagnosis not present

## 2019-11-23 DIAGNOSIS — I484 Atypical atrial flutter: Secondary | ICD-10-CM | POA: Diagnosis not present

## 2019-11-23 DIAGNOSIS — T401X1A Poisoning by heroin, accidental (unintentional), initial encounter: Secondary | ICD-10-CM | POA: Diagnosis present

## 2019-11-23 DIAGNOSIS — R0902 Hypoxemia: Secondary | ICD-10-CM | POA: Diagnosis not present

## 2019-11-23 DIAGNOSIS — Z79899 Other long term (current) drug therapy: Secondary | ICD-10-CM | POA: Diagnosis not present

## 2019-11-23 DIAGNOSIS — Z95828 Presence of other vascular implants and grafts: Secondary | ICD-10-CM | POA: Diagnosis not present

## 2019-11-23 DIAGNOSIS — Z20822 Contact with and (suspected) exposure to covid-19: Secondary | ICD-10-CM | POA: Diagnosis not present

## 2019-11-23 DIAGNOSIS — F10239 Alcohol dependence with withdrawal, unspecified: Secondary | ICD-10-CM | POA: Diagnosis present

## 2019-11-23 DIAGNOSIS — D61818 Other pancytopenia: Secondary | ICD-10-CM | POA: Diagnosis not present

## 2019-11-23 DIAGNOSIS — J690 Pneumonitis due to inhalation of food and vomit: Secondary | ICD-10-CM

## 2019-11-23 DIAGNOSIS — I10 Essential (primary) hypertension: Secondary | ICD-10-CM | POA: Diagnosis present

## 2019-11-23 DIAGNOSIS — D61810 Antineoplastic chemotherapy induced pancytopenia: Secondary | ICD-10-CM | POA: Diagnosis present

## 2019-11-23 DIAGNOSIS — C3490 Malignant neoplasm of unspecified part of unspecified bronchus or lung: Secondary | ICD-10-CM | POA: Diagnosis present

## 2019-11-23 DIAGNOSIS — Y906 Blood alcohol level of 120-199 mg/100 ml: Secondary | ICD-10-CM | POA: Diagnosis present

## 2019-11-23 DIAGNOSIS — F419 Anxiety disorder, unspecified: Secondary | ICD-10-CM | POA: Diagnosis present

## 2019-11-23 DIAGNOSIS — J9601 Acute respiratory failure with hypoxia: Secondary | ICD-10-CM | POA: Insufficient documentation

## 2019-11-23 DIAGNOSIS — I4891 Unspecified atrial fibrillation: Secondary | ICD-10-CM | POA: Diagnosis present

## 2019-11-23 DIAGNOSIS — Z7901 Long term (current) use of anticoagulants: Secondary | ICD-10-CM | POA: Diagnosis not present

## 2019-11-23 DIAGNOSIS — T451X5A Adverse effect of antineoplastic and immunosuppressive drugs, initial encounter: Secondary | ICD-10-CM | POA: Diagnosis present

## 2019-11-23 LAB — CBC WITH DIFFERENTIAL/PLATELET
Abs Immature Granulocytes: 0.63 10*3/uL — ABNORMAL HIGH (ref 0.00–0.07)
Basophils Absolute: 0.1 10*3/uL (ref 0.0–0.1)
Basophils Relative: 1 %
Eosinophils Absolute: 0 10*3/uL (ref 0.0–0.5)
Eosinophils Relative: 0 %
HCT: 19.6 % — ABNORMAL LOW (ref 39.0–52.0)
Hemoglobin: 6.3 g/dL — ABNORMAL LOW (ref 13.0–17.0)
Immature Granulocytes: 9 %
Lymphocytes Relative: 11 %
Lymphs Abs: 0.8 10*3/uL (ref 0.7–4.0)
MCH: 33.7 pg (ref 26.0–34.0)
MCHC: 32.1 g/dL (ref 30.0–36.0)
MCV: 104.8 fL — ABNORMAL HIGH (ref 80.0–100.0)
Monocytes Absolute: 0.9 10*3/uL (ref 0.1–1.0)
Monocytes Relative: 12 %
Neutro Abs: 4.7 10*3/uL (ref 1.7–7.7)
Neutrophils Relative %: 67 %
Platelets: 38 10*3/uL — ABNORMAL LOW (ref 150–400)
RBC: 1.87 MIL/uL — ABNORMAL LOW (ref 4.22–5.81)
RDW: 17.1 % — ABNORMAL HIGH (ref 11.5–15.5)
Smear Review: NORMAL
WBC: 7 10*3/uL (ref 4.0–10.5)
nRBC: 1.6 % — ABNORMAL HIGH (ref 0.0–0.2)

## 2019-11-23 LAB — BLOOD CULTURE ID PANEL (REFLEXED) - BCID2

## 2019-11-23 LAB — MAGNESIUM
Magnesium: 1.4 mg/dL — ABNORMAL LOW (ref 1.7–2.4)
Magnesium: 1.7 mg/dL (ref 1.7–2.4)

## 2019-11-23 LAB — URINE DRUG SCREEN, QUALITATIVE (ARMC ONLY)
Amphetamines, Ur Screen: NOT DETECTED
Barbiturates, Ur Screen: NOT DETECTED
Benzodiazepine, Ur Scrn: NOT DETECTED
Cannabinoid 50 Ng, Ur ~~LOC~~: NOT DETECTED
Cocaine Metabolite,Ur ~~LOC~~: POSITIVE — AB
MDMA (Ecstasy)Ur Screen: NOT DETECTED
Methadone Scn, Ur: NOT DETECTED
Opiate, Ur Screen: POSITIVE — AB
Phencyclidine (PCP) Ur S: NOT DETECTED
Tricyclic, Ur Screen: NOT DETECTED

## 2019-11-23 LAB — BASIC METABOLIC PANEL
Anion gap: 11 (ref 5–15)
BUN: 11 mg/dL (ref 8–23)
CO2: 27 mmol/L (ref 22–32)
Calcium: 8.6 mg/dL — ABNORMAL LOW (ref 8.9–10.3)
Chloride: 98 mmol/L (ref 98–111)
Creatinine, Ser: 0.68 mg/dL (ref 0.61–1.24)
GFR calc Af Amer: >60 mL/min (ref >60)
GFR calc non Af Amer: >60 mL/min (ref >60)
Glucose, Bld: 121 mg/dL — ABNORMAL HIGH (ref 70–99)
Potassium: 3.8 mmol/L (ref 3.5–5.1)
Sodium: 136 mmol/L (ref 135–145)

## 2019-11-23 LAB — PHOSPHORUS: Phosphorus: 4 mg/dL (ref 2.5–4.6)

## 2019-11-23 LAB — PREPARE RBC (CROSSMATCH)

## 2019-11-23 LAB — CBC
HCT: 19.6 % — ABNORMAL LOW (ref 39.0–52.0)
Hemoglobin: 6.3 g/dL — ABNORMAL LOW (ref 13.0–17.0)
MCH: 33.3 pg (ref 26.0–34.0)
MCHC: 32.1 g/dL (ref 30.0–36.0)
MCV: 103.7 fL — ABNORMAL HIGH (ref 80.0–100.0)
Platelets: 40 10*3/uL — ABNORMAL LOW (ref 150–400)
RBC: 1.89 MIL/uL — ABNORMAL LOW (ref 4.22–5.81)
RDW: 17.2 % — ABNORMAL HIGH (ref 11.5–15.5)
WBC: 8.1 10*3/uL (ref 4.0–10.5)
nRBC: 1 % — ABNORMAL HIGH (ref 0.0–0.2)

## 2019-11-23 LAB — PROCALCITONIN: Procalcitonin: <0.1 ng/mL

## 2019-11-23 LAB — LACTIC ACID, PLASMA
Lactic Acid, Venous: 3 mmol/L (ref 0.5–1.9)
Lactic Acid, Venous: 2.9 mmol/L (ref 0.5–1.9)

## 2019-11-23 LAB — HIV ANTIBODY (ROUTINE TESTING W REFLEX): HIV Screen 4th Generation wRfx: NONREACTIVE

## 2019-11-23 LAB — AMMONIA: Ammonia: 20 umol/L (ref 9–35)

## 2019-11-23 LAB — SARS CORONAVIRUS 2 BY RT PCR (HOSPITAL ORDER, PERFORMED IN ~~LOC~~ HOSPITAL LAB): SARS Coronavirus 2: NEGATIVE

## 2019-11-23 MED ORDER — HYDROCODONE-ACETAMINOPHEN 5-325 MG PO TABS: 1.0000 | ORAL_TABLET | Freq: Four times a day (QID) | ORAL | Status: DC | PRN

## 2019-11-23 MED ORDER — ENSURE ENLIVE PO LIQD
237.0000 mL | Freq: Three times a day (TID) | ORAL | Status: DC
  Administered 2019-11-23 (×2): 237 mL via ORAL

## 2019-11-23 MED ORDER — METOPROLOL TARTRATE 5 MG/5ML IV SOLN: 5.0000 mg | Freq: Four times a day (QID) | INTRAVENOUS | Status: DC | PRN

## 2019-11-23 MED ORDER — APIXABAN 5 MG PO TABS
5.0000 mg | ORAL_TABLET | Freq: Two times a day (BID) | ORAL | Status: DC
  Administered 2019-11-23: 5 mg via ORAL
  Filled 2019-11-23: qty 1

## 2019-11-23 MED ORDER — ESCITALOPRAM OXALATE 10 MG PO TABS
10.0000 mg | ORAL_TABLET | Freq: Every day | ORAL | Status: DC
  Administered 2019-11-23 – 2019-11-25 (×3): 10 mg via ORAL
  Filled 2019-11-23 (×3): qty 1

## 2019-11-23 MED ORDER — FUROSEMIDE 20 MG PO TABS
20.0000 mg | ORAL_TABLET | Freq: Every day | ORAL | Status: DC
  Administered 2019-11-23 – 2019-11-25 (×3): 20 mg via ORAL
  Filled 2019-11-23 (×3): qty 1

## 2019-11-23 MED ORDER — MAGNESIUM SULFATE 4 GM/100ML IV SOLN
4.0000 g | Freq: Once | INTRAVENOUS | Status: AC
  Administered 2019-11-23: 4 g via INTRAVENOUS
  Filled 2019-11-23: qty 100

## 2019-11-23 MED ORDER — THIAMINE HCL 100 MG PO TABS
100.0000 mg | ORAL_TABLET | Freq: Every day | ORAL | Status: DC
  Administered 2019-11-23 – 2019-11-25 (×3): 100 mg via ORAL
  Filled 2019-11-23 (×3): qty 1

## 2019-11-23 MED ORDER — LORAZEPAM 2 MG/ML IJ SOLN: 1.0000 mg | INTRAMUSCULAR | Status: DC | PRN

## 2019-11-23 MED ORDER — SODIUM CHLORIDE 0.9% IV SOLUTION: Freq: Once | INTRAVENOUS | Status: AC

## 2019-11-23 MED ORDER — SODIUM CHLORIDE 0.9 % IV BOLUS
1000.0000 mL | Freq: Once | INTRAVENOUS | Status: AC
  Administered 2019-11-23: 1000 mL via INTRAVENOUS

## 2019-11-23 MED ORDER — LOSARTAN POTASSIUM 25 MG PO TABS
25.0000 mg | ORAL_TABLET | Freq: Every day | ORAL | Status: DC
  Administered 2019-11-23 – 2019-11-24 (×2): 25 mg via ORAL
  Filled 2019-11-23 (×2): qty 1

## 2019-11-23 MED ORDER — ADULT MULTIVITAMIN W/MINERALS CH
1.0000 | ORAL_TABLET | Freq: Every day | ORAL | Status: DC
  Administered 2019-11-23 – 2019-11-25 (×3): 1 via ORAL
  Filled 2019-11-23 (×3): qty 1

## 2019-11-23 MED ORDER — PIPERACILLIN-TAZOBACTAM 3.375 G IVPB 30 MIN: 3.3750 g | Freq: Three times a day (TID) | INTRAVENOUS | Status: DC

## 2019-11-23 MED ORDER — FOLIC ACID 1 MG PO TABS
1.0000 mg | ORAL_TABLET | Freq: Every day | ORAL | Status: DC
  Administered 2019-11-23 – 2019-11-25 (×3): 1 mg via ORAL
  Filled 2019-11-23 (×3): qty 1

## 2019-11-23 MED ORDER — PIPERACILLIN-TAZOBACTAM 3.375 G IVPB
3.3750 g | Freq: Three times a day (TID) | INTRAVENOUS | Status: DC
  Administered 2019-11-23 – 2019-11-25 (×8): 3.375 g via INTRAVENOUS
  Filled 2019-11-23 (×8): qty 50

## 2019-11-23 MED ORDER — THIAMINE HCL 100 MG/ML IJ SOLN: 100.0000 mg | Freq: Every day | INTRAMUSCULAR | Status: DC

## 2019-11-23 MED ORDER — LORAZEPAM 1 MG PO TABS
1.0000 mg | ORAL_TABLET | ORAL | Status: DC | PRN
  Administered 2019-11-23: 1 mg via ORAL
  Filled 2019-11-23: qty 1

## 2019-11-23 MED ORDER — PIPERACILLIN-TAZOBACTAM 3.375 G IVPB 30 MIN
3.3750 g | Freq: Once | INTRAVENOUS | Status: AC
  Administered 2019-11-23: 3.375 g via INTRAVENOUS
  Filled 2019-11-23: qty 50

## 2019-11-23 MED ORDER — SODIUM CHLORIDE 0.9 % IV SOLN: INTRAVENOUS | Status: DC

## 2019-11-23 MED ORDER — DILTIAZEM HCL ER COATED BEADS 120 MG PO CP24
240.0000 mg | ORAL_CAPSULE | Freq: Every day | ORAL | Status: DC
  Administered 2019-11-23 – 2019-11-25 (×3): 240 mg via ORAL
  Filled 2019-11-23 (×3): qty 2

## 2019-11-23 MED ORDER — SODIUM CHLORIDE 0.9 % IV SOLN: 1.0000 g | Freq: Once | INTRAVENOUS | Status: DC

## 2019-11-23 MED ORDER — CHLORHEXIDINE GLUCONATE CLOTH 2 % EX PADS: 6.0000 | MEDICATED_PAD | Freq: Every day | CUTANEOUS | Status: DC

## 2019-11-23 NOTE — ED Notes (Signed)
CRITICAL LAB: LACTIC is 3.0, [facility], Dr. Beather Arbour and Larene Beach, RN notified, pt is set for admission

## 2019-11-23 NOTE — H&P (Signed)
History and Physical    Scott Jordan STM:[id number] DOB: 10-Nov-1950 DOA: 11/22/2019  PCP: Patient, No Pcp Per   Patient coming from: Home  I have personally briefly reviewed patient's old medical records in [place]  Chief Complaint: Acute confusion  HPI: Scott Jordan is a 69 y.o. male with medical history significant for lung cancer on chemotherapy, A. fib on Eliquis, HTN, history of IV drug use who was brought in by EMS following a heroin overdose.  He was administered Narcan by family member at and subsequently by the fire department.  By the arrival of EMS he was awake but confused and unable to contribute to the history ED Course: On arrival he was tachycardic at 127 with hypoxia of 90% on room air and otherwise normal vitals.  Blood work showed pancytopenia though at baseline with hemoglobin 7.9, WBC 2400 and platelets 60,000.  EtOH level of 140.  Venous blood gas unremarkable.  CT head negative.  CTA chest showed no PE but with findings suspicious for aspiration pneumonia including COVID-19.  Patient started on IV Zosyn.  Hospitalist consulted for admission.  Review of Systems: As per HPI otherwise all other systems on review of systems negative.    Past Medical History:  Diagnosis Date  . A-fib ([place]) 01/10/2019   pt st this was dx by Dr. Ubaldo Glassing  . Cancer ([place]) 2021   LUNG  . Complication of anesthesia    DIFFICULTY WAKING UP AFTER SURGERY- 20 YRS AGO  . Hypertension   . Lung cancer ([place])   . Substance abuse [facility])     Past Surgical History:  Procedure Laterality Date  . ARM DEBRIDEMENT Left    INCISION AND DEBRIDEMENT LOWER ARM -20 YRS AGO  . BACK SURGERY    . PORTACATH PLACEMENT Left 08/29/2019   Procedure: INSERTION PORT-A-CATH;  Surgeon: Nestor Lewandowsky, MD;  Location: [facility] ORS;  Service: General;  Laterality: Left;     reports that he has quit smoking. His smoking use included cigarettes. He smoked 0.50 packs per day. He has never used smokeless tobacco. He  reports current alcohol use. He reports previous drug use. Drug: Heroin.  No Known Allergies  History reviewed. No pertinent family history.    Prior to Admission medications   Medication Sig Start Date End Date Taking? Authorizing Provider  diltiazem (CARDIZEM CD) 240 MG 24 hr capsule Take 1 capsule (240 mg total) by mouth daily. 08/23/19 11/21/19  Enzo Bi, MD  ELIQUIS 5 MG TABS tablet Take 5 mg by mouth 2 (two) times daily. 08/30/19   [provider]  escitalopram (LEXAPRO) 10 MG tablet Take 1 tablet (10 mg total) by mouth daily. Patient not taking: Reported on 11/22/2019 08/30/19   Jacquelin Hawking, NP  feeding supplement, ENSURE ENLIVE, (ENSURE ENLIVE) LIQD Take 237 mLs by mouth 3 (three) times daily between meals. 08/23/19   Enzo Bi, MD  furosemide (LASIX) 20 MG tablet Take 1 tablet (20 mg total) by mouth daily. 09/04/19   Lequita Asal, MD  HYDROcodone-acetaminophen (NORCO/VICODIN) 5-325 MG tablet Take 1 tablet by mouth every 6 (six) hours as needed for moderate pain. 09/02/19   Lequita Asal, MD  lidocaine-prilocaine (EMLA) cream Apply to affected area once 08/26/19   Lequita Asal, MD  losartan (COZAAR) 25 MG tablet Take 1 tablet (25 mg total) by mouth daily. 08/23/19 11/21/19  Enzo Bi, MD  nystatin (MYCOSTATIN) 100000 UNIT/ML suspension Use as directed 5 mLs (500,000 Units total) in the  mouth or throat 4 (four) times daily. Swish and spit Patient not taking: Reported on 11/22/2019 09/02/19   Lequita Asal, MD  ondansetron (ZOFRAN) 8 MG tablet Take 1 tablet (8 mg total) by mouth every 8 (eight) hours as needed for refractory nausea / vomiting. Start on day 3 after carboplatin chemo. Patient not taking: Reported on 10/03/2019 08/26/19   Lequita Asal, MD  sodium chloride 1 g tablet Take 1 tablet (1 g total) by mouth 3 (three) times daily with meals. 09/23/19   Lequita Asal, MD    Physical Exam: Vitals:   11/22/19 2240 11/22/19 2338 11/23/19 0130  11/23/19 0200  BP:  126/88 (!) 142/82 (!) 142/95  Pulse:  65 (!) 130 (!) 129  Resp:  15 (!) 23 19  Temp:      TempSrc:      SpO2: 95% 92% 97% 97%  Weight:      Height:         Vitals:   11/22/19 2240 11/22/19 2338 11/23/19 0130 11/23/19 0200  BP:  126/88 (!) 142/82 (!) 142/95  Pulse:  65 (!) 130 (!) 129  Resp:  15 (!) 23 19  Temp:      TempSrc:      SpO2: 95% 92% 97% 97%  Weight:      Height:          Constitutional: Alert and oriented x 3 . Not in any apparent distress HEENT:      Head: Normocephalic and atraumatic.         Eyes: PERLA, EOMI, Conjunctivae are normal. Sclera is non-icteric.       Mouth/Throat: Mucous membranes are moist.       Neck: Supple with no signs of meningismus. Cardiovascular: Regular rate and rhythm. No murmurs, gallops, or rubs. 2+ symmetrical distal pulses are present . No JVD. No LE edema Respiratory: Respiratory effort increased with coarse breath sounds mostly on the right Gastrointestinal: Soft, non tender, and non distended with positive bowel sounds. No rebound or guarding. Genitourinary: No CVA tenderness. Musculoskeletal: Nontender with normal range of motion in all extremities. No cyanosis, or erythema of extremities. Neurologic: Normal speech and language. Face is symmetric. Moving all extremities. No gross focal neurologic deficits . Skin: Skin is warm, dry.  No rash or ulcers Psychiatric: Mood and affect are normal Speech and behavior are normal   Labs on Admission: I have personally reviewed following labs and imaging studies  CBC: Recent Labs  Lab 11/20/19 1117 11/22/19 1033 11/22/19 2116  WBC 1.4* 2.4* 8.6  NEUTROABS 0.6* 1.1* 3.8  HGB 7.4* 7.7* 7.9*  HCT 22.1* 22.9* 23.6*  MCV 98.7 99.1 100.9*  PLT 97* 55* 58*   Basic Metabolic Panel: Recent Labs  Lab 11/20/19 1117 11/22/19 2116  NA 137 136  K 3.6 3.4*  CL 101 98  CO2 28 23  GLUCOSE 132* 159*  BUN 12 10  CREATININE 0.62 0.85  CALCIUM 8.8* 9.0  MG 1.6*   --    GFR: Estimated Creatinine Clearance: 70.6 mL/min (by C-G formula based on SCr of 0.85 mg/dL). Liver Function Tests: Recent Labs  Lab 11/22/19 2116  AST 27  ALT 18  ALKPHOS 113  BILITOT 0.6  PROT 7.5  ALBUMIN 3.9   No results for input(s): LIPASE, AMYLASE in the last 168 hours. Recent Labs  Lab 11/22/19 2339  AMMONIA 20   Coagulation Profile: No results for input(s): INR, PROTIME in the last 168 hours. Cardiac Enzymes: No  results for input(s): CKTOTAL, CKMB, CKMBINDEX, TROPONINI in the last 168 hours. BNP (last 3 results) No results for input(s): PROBNP in the last 8760 hours. HbA1C: No results for input(s): HGBA1C in the last 72 hours. CBG: No results for input(s): GLUCAP in the last 168 hours. Lipid Profile: No results for input(s): CHOL, HDL, LDLCALC, TRIG, CHOLHDL, LDLDIRECT in the last 72 hours. Thyroid Function Tests: No results for input(s): TSH, T4TOTAL, FREET4, T3FREE, THYROIDAB in the last 72 hours. Anemia Panel: No results for input(s): VITAMINB12, FOLATE, FERRITIN, TIBC, IRON, RETICCTPCT in the last 72 hours. Urine analysis: No results found for: COLORURINE, APPEARANCEUR, [place], [place], GLUCOSEU, HGBUR, BILIRUBINUR, KETONESUR, PROTEINUR, UROBILINOGEN, NITRITE, LEUKOCYTESUR  Radiological Exams on Admission: CT Head Wo Contrast  Result Date: 11/22/2019 CLINICAL DATA:  Altered mental status. EXAM: CT HEAD WITHOUT CONTRAST TECHNIQUE: Contiguous axial images were obtained from the base of the skull through the vertex without intravenous contrast. COMPARISON:  MRI 08/21/2019 FINDINGS: Brain: Old lacunar infarct in the left periventricular white matter. No acute intracranial abnormality. Specifically, no hemorrhage, hydrocephalus, mass lesion, acute infarction, or significant intracranial injury. Vascular: No hyperdense vessel or unexpected calcification. Skull: No acute calvarial abnormality. Sinuses/Orbits: Visualized paranasal sinuses and mastoids clear.  Orbital soft tissues unremarkable. Other: None IMPRESSION: Old left periventricular lacunar infarct. No acute intracranial abnormality. Electronically Signed   By: Rolm Baptise M.D.   On: 11/22/2019 22:49   CT Angio Chest PE W and/or Wo Contrast  Result Date: 11/23/2019 CLINICAL DATA:  PE suspected, high prob Shortness of breath. Radiologic records demonstrate history of left lower lobe small cell lung cancer. EXAM: CT ANGIOGRAPHY CHEST WITH CONTRAST TECHNIQUE: Multidetector CT imaging of the chest was performed using the standard protocol during bolus administration of intravenous contrast. Multiplanar CT image reconstructions and MIPs were obtained to evaluate the vascular anatomy. CONTRAST:  31mL OMNIPAQUE IOHEXOL 350 MG/ML SOLN COMPARISON:  Radiograph earlier today.  Chest CT 10/11/2019 FINDINGS: Cardiovascular: There are no filling defects within the pulmonary arteries to suggest pulmonary embolus. Upper normal heart size. Coronary artery calcifications. Aortic atherosclerosis without dissection. Left chest port in place. No pericardial effusion. Mediastinum/Nodes: There is a 12 mm right hilar node, series 4, image 52, increased in size from prior exam. There is an 11 mm low right hilar node series 4, image 58, also increased in size. No enlarged left hilar lymph nodes. 7 mm AP window nodes. No progressive mediastinal adenopathy. No visualized thyroid nodule. Lungs/Pleura: Emphysema. Small left pleural effusion with slight increase size from prior exam. Pleural fluid tracks into the inter lobar fissure. Volume loss in the left lower lobe with areas of consolidation, not significantly changed from prior. There is new patchy and ground-glass opacity in the dependent right lower lobe, perifissural right middle and right upper lobe. No right pleural effusion. Right lower lobe bronchial thickening with areas of mucous plugging. There is retained mucus within the trachea and to a lesser extent right mainstem  bronchus. No pneumothorax. Upper Abdomen: No acute or expected finding. Aortic atherosclerosis. Musculoskeletal: Degenerative change in the spine. No evidence of focal lesion or acute fracture. Probable remote fracture of the mid medial left clavicle, unchanged. Review of the MIP images confirms the above findings. IMPRESSION: 1. No pulmonary embolus. 2. New patchy and ground-glass opacity in the dependent right lower lobe, perifissural right middle and right upper lobe. Findings suspicious for pneumonia, including aspiration or atypical viral organisms such as COVID-19. 3. Small left pleural effusion with slight increase size from prior exam.  Volume loss in the left lower lobe with areas of ill-defined consolidation, not significantly changed from prior. 4. Right hilar adenopathy, new from prior exam, nonspecific in this setting. Recommend attention at follow-up. 5. Debris within the trachea and to a lesser extent right mainstem bronchus. Right lower lobe bronchial thickening with areas of mucous plugging. Aortic Atherosclerosis (ICD10-I70.0) and Emphysema (ICD10-J43.9). Electronically Signed   By: Keith Rake M.D.   On: 11/23/2019 00:06   DG Chest Portable 1 View  Result Date: 11/22/2019 CLINICAL DATA:  Shortness of breath EXAM: PORTABLE CHEST 1 VIEW COMPARISON:  08/17/2019, 08/29/2019, PET CT 08/29/2019 FINDINGS: Left-sided central venous port tip over the cavoatrial region. Mild elevation of left diaphragm. Small residual left pleural effusion, decreased compared to prior radiograph. Airspace disease at the left base. Mild cardiomegaly with aortic atherosclerosis. No pneumothorax. IMPRESSION: Small residual left pleural effusion with residual airspace disease in the left lung base likely corresponding to known mass and adjacent atelectasis or pneumonia. Mild cardiomegaly. Electronically Signed   By: Donavan Foil M.D.   On: 11/22/2019 21:38    EKG: Independently reviewed. Interpretation : Sinus  tachycardia  Assessment/Plan 69 year old male with history of small cell lung cancer on chemo, A. fib on Eliquis, HTN, history of IV drug admitted with  accidental heroin hospitalist requiring Narcan reversal.  Hypoxic and tachycardic on arrival with work-up showing aspiration pneumonia   Aspiration pneumonia ([place]) -Likely secondary to heroin overdose -IV Zosyn -Supplemental oxygen -Aspiration precautions    Heroin overdose, accidental or unintentional, initial encounter ([place]) -Counseled on abstinence -Returned to baseline mentation with Narcan given prearrival    Small cell lung cancer ([place])   Chemotherapy-induced neutropenia ([place])   Pancytopenia ([place]) -Continue outpatient follow-up with oncologist -Pancytopenia at baseline    Atypical atrial flutter ([place]) -Continue Eliquis, follows with Dr. Ubaldo Glassing -Monitor for bleeding in view of thrombocytopenia    DVT prophylaxis: Lovenox  Code Status: full code  Family Communication:  none  Disposition Plan: Back to previous home environment Consults called: none  Status: Observation    Athena Masse MD [facility]     11/23/2019, 2:16 AM

## 2019-11-23 NOTE — Progress Notes (Signed)
Initial Nutrition Assessment  RD working remotely.  DOCUMENTATION CODES:   Underweight  INTERVENTION:   -Ensure Enlive po TID, each supplement provides 350 kcal and 20 grams of protein -Continue MVI with minerals daily  NUTRITION DIAGNOSIS:   Increased nutrient needs related to cancer and cancer related treatments, chronic illness (stage IV lung cancer) as evidenced by estimated needs.  GOAL:   Patient will meet greater than or equal to 90% of their needs  MONITOR:   Supplement acceptance, PO intake, Labs, Weight trends, Skin, I & O's  REASON FOR ASSESSMENT:   Malnutrition Screening Tool    ASSESSMENT:   69 year old male with history of small cell lung cancer on chemo, A. fib on Eliquis, HTN, history of IV drug admitted with  accidental heroin hospitalist requiring Narcan reversal.  Hypoxic and tachycardic on arrival with work-up showing aspiration pneumonia  Pt admitted with aspiration pneumonia and accidental heroin overdose.   Reviewed I/O's: -400 ml x 24 hours  UOP: 400 ml x 24 hours  Attempted to speak with pt via phone call to hospital room, however, no answer.   Per MD notes, pt with mild alcohol withdrawal; his last drink was 3-4 days ago.   Pt currently on a heart healthy diet; noted meal completion 40%. Highly suspect poor oral intake and diet of poor nutritional quality PTA secondary to ETOH abuse.   Reviewed wt hx; pt has experienced a 10.3% wt loss over the past 3 months, which is significant for time frame. Highly suspect pt with malnutrition, however, unable to identify at this time without completion of nutrition-focused physical exam (pt has also been identified with malnutrition during previous hospitalizations). Pt would greatly benefit from addition of oral nutrition supplements.   Medications reviewed and include cardizem, lasix, thiamine, magnesium sulfate, and 0.9% sodium chloride infusion @ 50 ml/hr.   Labs reviewed: Mg: 1.4 (on IV  supplementation).   Diet Order:   Diet Order            Diet Heart Room service appropriate? Yes; Fluid consistency: Thin  Diet effective now                 EDUCATION NEEDS:   Not appropriate for education at this time  Skin:  Skin Assessment: Reviewed RN Assessment  Last BM:  Unknown  Height:   Ht Readings from Last 1 Encounters:  11/23/19 6\' 2"  (1.88 m)    Weight:   Wt Readings from Last 1 Encounters:  11/23/19 60.3 kg    Ideal Body Weight:  86.4 kg  BMI:  Body mass index is 17.08 kg/m.  Estimated Nutritional Needs:   Kcal:  2200-2400  Protein:  120-135 grams  Fluid:  > 2.2 L    Loistine Chance, RD, LDN, [place] Registered Dietitian II Certified Diabetes Care and Education Specialist Please refer to [facility] for RD and/or RD on-call/weekend/after hours pager

## 2019-11-23 NOTE — ED Notes (Signed)
Family has left bedside. Can call niece, Ronny Bacon, if needed. Door open for closer monitoring of patient. Pt in NAD at this time.

## 2019-11-23 NOTE — Progress Notes (Signed)
PHARMACY - PHYSICIAN COMMUNICATION CRITICAL VALUE ALERT - BLOOD CULTURE IDENTIFICATION (BCID)  Scott Jordan is an 69 y.o. male who presented to [facility] on 11/22/2019 with a chief complaint of AMS s/t heroin overdose w/ h/o IVDU, lung cancer on IV chemo.  Assessment:  Tachycardic, O2 95 - 100 on 2L  no h/o COPD but does have afib on eliquis PTA but held currently d/t thrombocytopenia, and lung mass, LA 3.0 >> 2.9, PCT negative, positive x cocaine/opiates, CXR: IMPRESSION: Small residual left pleural effusion with residual airspace disease in the left lung base likely corresponding to known mass and adjacent atelectasis or pneumonia. Mild cardiomegaly.   CT chest: IMPRESSION: 1. No pulmonary embolus. 2. New patchy and ground-glass opacity in the dependent right lower lobe, perifissural right middle and right upper lobe. Findings suspicious for pneumonia, including aspiration or atypical viral organisms such as COVID-19. 3. Small left pleural effusion with slight increase size from prior exam. Volume loss in the left lower lobe with areas of ill-defined consolidation, not significantly changed from prior. 4. Right hilar adenopathy, new from prior exam, nonspecific in this setting. Recommend attention at follow-up. 5. Debris within the trachea and to a lesser extent right mainstem bronchus. Right lower lobe bronchial thickening with areas of mucous plugging.   1/4 GPC staph species no subspecies or sensitivities available at this time.  Name of physician (or Provider) Contacted: Sharion Settler  Current antibiotics: Zosyn 3.375g IV q8h   Changes to prescribed antibiotics recommended:  Patient is on recommended antibiotics - No changes needed  Results for orders placed or performed during the hospital encounter of 11/22/19  Blood Culture ID Panel (Reflexed) (Collected: 11/23/2019  1:35 AM)  Result Value Ref Range   Enterococcus faecalis NOT DETECTED NOT DETECTED   Enterococcus  Faecium NOT DETECTED NOT DETECTED   Listeria monocytogenes NOT DETECTED NOT DETECTED   Staphylococcus species DETECTED (A) NOT DETECTED   Staphylococcus aureus (BCID) NOT DETECTED NOT DETECTED   Staphylococcus epidermidis NOT DETECTED NOT DETECTED   Staphylococcus lugdunensis NOT DETECTED NOT DETECTED   Streptococcus species NOT DETECTED NOT DETECTED   Streptococcus agalactiae NOT DETECTED NOT DETECTED   Streptococcus pneumoniae NOT DETECTED NOT DETECTED   Streptococcus pyogenes NOT DETECTED NOT DETECTED   A.calcoaceticus-baumannii NOT DETECTED NOT DETECTED   Bacteroides fragilis NOT DETECTED NOT DETECTED   Enterobacterales NOT DETECTED NOT DETECTED   Enterobacter cloacae complex NOT DETECTED NOT DETECTED   Escherichia coli NOT DETECTED NOT DETECTED   Klebsiella aerogenes NOT DETECTED NOT DETECTED   Klebsiella oxytoca NOT DETECTED NOT DETECTED   Klebsiella pneumoniae NOT DETECTED NOT DETECTED   Proteus species NOT DETECTED NOT DETECTED   Salmonella species NOT DETECTED NOT DETECTED   Serratia marcescens NOT DETECTED NOT DETECTED   Haemophilus influenzae NOT DETECTED NOT DETECTED   Neisseria meningitidis NOT DETECTED NOT DETECTED   Pseudomonas aeruginosa NOT DETECTED NOT DETECTED   Stenotrophomonas maltophilia NOT DETECTED NOT DETECTED   Candida albicans NOT DETECTED NOT DETECTED   Candida auris NOT DETECTED NOT DETECTED   Candida glabrata NOT DETECTED NOT DETECTED   Candida krusei NOT DETECTED NOT DETECTED   Candida parapsilosis NOT DETECTED NOT DETECTED   Candida tropicalis NOT DETECTED NOT DETECTED   Cryptococcus neoformans/gattii NOT DETECTED NOT DETECTED   Tobie Lords, PharmD, BCPS Clinical Pharmacist 11/23/2019  11:19 PM

## 2019-11-23 NOTE — Progress Notes (Addendum)
PROGRESS NOTE    [name]  WNI:[id number] DOB: 15-Mar-1951 DOA: 11/22/2019 PCP: Patient, No Pcp Per   Chief complaint.  Altered mental status.  Brief Narrative: (Start on day 1 of progress note - keep it brief and live) Scott Jordan is a 69 y.o. male with medical history significant for lung cancer on chemotherapy, A. fib on Eliquis, HTN, history of IV drug use who was brought in by EMS following a heroin overdose.  He was administered Narcan by family member at and subsequently by the fire department.  By the arrival of EMS he was awake but confused and unable to contribute to the history He had some hypoxemia at time of admission, a chest CT scan showed possible aspiration pneumonia.  He was placed on Zosyn.  8/14.  Hemoglobin dropped down to 6.3, giving 1 unit PRBC.  Continue Zosyn.  Patient also had a significant tachycardia as well as hand tremor.  He was chronic alcohol drinker, start CIWA protocol.   Assessment & Plan:   Principal Problem:   Aspiration pneumonia ([place]) Active Problems:   Small cell lung cancer ([place])   Chemotherapy-induced neutropenia ([facility])   Heroin overdose, accidental or unintentional, initial encounter ([place])   Pancytopenia ([place])   Atypical atrial flutter ([place])  #1.  Aspiration pneumonia. Likely secondary to heroin overdose. Continue Zosyn. He has very mild hypoxemia, continue oxygen treatment.  2.  Pancytopenia.  Secondary to chemotherapy for small cell lung cancer. Hemoglobin dropped down to 6.3, will give 1 unit PRBC.  Patient has no evidence of GI bleed.  Recheck a CBC tomorrow.  3.  Altered mental status secondary to heroin overdose. Condition had improved  4.  Atypical atrial flutter with rapid ventricle response. Reviewed all EKGs performed yesterday and today, compared to today's telemetry, patient probably in atrial flutter.  I will hold anticoagulation due to worsening thrombocytopenia. Continue oral diltiazem.  Patient blood pressure is  elevated, I will also start as needed metoprolol for heart rate over 110.  5.  Alcohol withdrawal.  Patient has mild alcohol withdrawal symptoms with some hand tremor and tachycardia.  We will start thiamine folic acid.  Also start as needed Ativan.  Patient last drink alcohol was 3 or 4 days ago.      DVT prophylaxis: SCDs Code Status: Full Family Communication: None Disposition Plan:  . Patient came from: Home            . Anticipated d/c place: Home . Barriers to d/c OR conditions which need to be met to effect a safe d/c:   Consultants:   None  Procedures:None Antimicrobials:  Zosyn  Subjective: Patient has some anxiety and tachycardia.  Otherwise he denies any short of breath.  He is on oxygen. He had a normal-looking bowel movement without rectal bleeding or black stool. No nausea vomiting or diarrhea. No fever or chills.  Objective: Vitals:   11/23/19 0200 11/23/19 0251 11/23/19 0735 11/23/19 1127  BP: (!) 142/95 (!) 148/83 (!) 158/93 (!) 143/89  Pulse: (!) 129 (!) 105 (!) 131 (!) 132  Resp: _0 Temp:  97.8 F (36.6 C) (!) 97.5 F (36.4 C) 98.3 F (36.8 C)  TempSrc:  Oral Oral Oral  SpO2: 97% 95% 98% 98%  Weight:  60.3 kg    Height:  _1  (1.88 m)      Intake/Output Summary (Last 24 hours) at 11/23/2019 1315 Last data filed at 11/23/2019 1128 Gross per 24 hour  Intake  720 ml  Output 1050 ml  Net -330 ml   Filed Weights   11/22/19 2102 11/23/19 0251  Weight: 60 kg 60.3 kg    Examination:  General exam: Appears calm and comfortable  Respiratory system: Rhonchi in the base. Respiratory effort normal. Cardiovascular system: Regular and tachycardic. No JVD, murmurs, rubs, gallops or clicks. No pedal edema. Gastrointestinal system: Abdomen is nondistended, soft and nontender. No organomegaly or masses felt. Normal bowel sounds heard. Central nervous system: Alert and oriented. No focal neurological deficits. Extremities: Symmetric, hand  tremor Skin: No rashes, lesions or ulcers Psychiatry: Judgement and insight appear normal. Mood & affect appropriate.     Data Reviewed: I have personally reviewed following labs and imaging studies  CBC: Recent Labs  Lab 11/20/19 1117 11/22/19 1033 11/22/19 2116 11/23/19 0849  WBC 1.4* 2.4* 8.6 7.0  NEUTROABS 0.6* 1.1* 3.8 4.7  HGB 7.4* 7.7* 7.9* 6.3*  HCT 22.1* 22.9* 23.6* 19.6*  MCV 98.7 99.1 100.9* 104.8*  PLT 97* 55* 58* 38*   Basic Metabolic Panel: Recent Labs  Lab 11/20/19 1117 11/22/19 2116 11/23/19 0849  NA 137 136 136  K 3.6 3.4* 3.8  CL 101 98 98  CO2 _0 GLUCOSE 132* 159* 121*  BUN _1 CREATININE 0.62 0.85 0.68  CALCIUM 8.8* 9.0 8.6*  MG 1.6*  --  1.4*   GFR: Estimated Creatinine Clearance: 75.4 mL/min (by C-G formula based on SCr of 0.68 mg/dL). Liver Function Tests: Recent Labs  Lab 11/22/19 2116  AST 27  ALT 18  ALKPHOS 113  BILITOT 0.6  PROT 7.5  ALBUMIN 3.9   No results for input(s): LIPASE, AMYLASE in the last 168 hours. Recent Labs  Lab 11/22/19 2339  AMMONIA 20   Coagulation Profile: No results for input(s): INR, PROTIME in the last 168 hours. Cardiac Enzymes: No results for input(s): CKTOTAL, CKMB, CKMBINDEX, TROPONINI in the last 168 hours. BNP (last 3 results) No results for input(s): PROBNP in the last 8760 hours. HbA1C: No results for input(s): HGBA1C in the last 72 hours. CBG: No results for input(s): GLUCAP in the last 168 hours. Lipid Profile: No results for input(s): CHOL, HDL, LDLCALC, TRIG, CHOLHDL, LDLDIRECT in the last 72 hours. Thyroid Function Tests: No results for input(s): TSH, T4TOTAL, FREET4, T3FREE, THYROIDAB in the last 72 hours. Anemia Panel: No results for input(s): VITAMINB12, FOLATE, FERRITIN, TIBC, IRON, RETICCTPCT in the last 72 hours. Sepsis Labs: Recent Labs  Lab 11/23/19 0134 11/23/19 0849  PROCALCITON  --  <0.10  LATICACIDVEN 3.0* 2.9*    Recent Results (from the past 240  hour(s))  Culture, blood (routine x 2)     Status: None (Preliminary result)   Collection Time: 11/23/19  1:34 AM   Specimen: BLOOD  Result Value Ref Range Status   Specimen Description BLOOD BLOOD RIGHT FOREARM  Final   Special Requests   Final    BOTTLES DRAWN AEROBIC AND ANAEROBIC Blood Culture adequate volume   Culture   Final    NO GROWTH < 12 HOURS Performed at [facility], [address] [place], [place] [address]    Report Status PENDING  Incomplete  SARS Coronavirus 2 by RT PCR (hospital order, performed in [facility] hospital lab) Nasopharyngeal Nasopharyngeal Swab     Status: None   Collection Time: 11/23/19  1:34 AM   Specimen: Nasopharyngeal Swab  Result Value Ref Range Status   SARS Coronavirus 2 NEGATIVE NEGATIVE Final  Comment: (NOTE) SARS-CoV-2 target nucleic acids are NOT DETECTED.  The SARS-CoV-2 RNA is generally detectable in upper and lower respiratory specimens during the acute phase of infection. The lowest concentration of SARS-CoV-2 viral copies this assay can detect is 250 copies / mL. A negative result does not preclude SARS-CoV-2 infection and should not be used as the sole basis for treatment or other patient management decisions.  A negative result may occur with improper specimen collection / handling, submission of specimen other than nasopharyngeal swab, presence of viral mutation(s) within the areas targeted by this assay, and inadequate number of viral copies (<250 copies / mL). A negative result must be combined with clinical observations, patient history, and epidemiological information.  Fact Sheet for Patients:   StrictlyIdeas.no  Fact Sheet for Healthcare Providers: BankingDealers.co.za  This test is not yet approved or  cleared by the [place] FDA and has been authorized for detection and/or diagnosis of SARS-CoV-2 by FDA under an Emergency Use Authorization (EUA).  This  EUA will remain in effect (meaning this test can be used) for the duration of the COVID-19 declaration under Section 564(b)(1) of the Act, 21 U.S.C. section 360bbb-3(b)(1), unless the authorization is terminated or revoked sooner.  Performed at [facility], [address] [place], [place] [address]   Culture, blood (routine x 2)     Status: None (Preliminary result)   Collection Time: 11/23/19  1:35 AM   Specimen: BLOOD  Result Value Ref Range Status   Specimen Description BLOOD BLOOD LEFT FOREARM  Final   Special Requests   Final    BOTTLES DRAWN AEROBIC AND ANAEROBIC Blood Culture adequate volume   Culture   Final    NO GROWTH < 12 HOURS Performed at [facility], [address] [place], [place] [address]    Report Status PENDING  Incomplete         Radiology Studies: CT Head Wo Contrast  Result Date: 11/22/2019 CLINICAL DATA:  Altered mental status. EXAM: CT HEAD WITHOUT CONTRAST TECHNIQUE: Contiguous axial images were obtained from the base of the skull through the vertex without intravenous contrast. COMPARISON:  MRI 08/21/2019 FINDINGS: Brain: Old lacunar infarct in the left periventricular white matter. No acute intracranial abnormality. Specifically, no hemorrhage, hydrocephalus, mass lesion, acute infarction, or significant intracranial injury. Vascular: No hyperdense vessel or unexpected calcification. Skull: No acute calvarial abnormality. Sinuses/Orbits: Visualized paranasal sinuses and mastoids clear. Orbital soft tissues unremarkable. Other: None IMPRESSION: Old left periventricular lacunar infarct. No acute intracranial abnormality. Electronically Signed   By: Rolm Baptise M.D.   On: 11/22/2019 22:49   CT Angio Chest PE W and/or Wo Contrast  Result Date: 11/23/2019 CLINICAL DATA:  PE suspected, high prob Shortness of breath. Radiologic records demonstrate history of left lower lobe small cell lung cancer. EXAM: CT ANGIOGRAPHY CHEST WITH CONTRAST  TECHNIQUE: Multidetector CT imaging of the chest was performed using the standard protocol during bolus administration of intravenous contrast. Multiplanar CT image reconstructions and MIPs were obtained to evaluate the vascular anatomy. CONTRAST:  2m OMNIPAQUE IOHEXOL 350 MG/ML SOLN COMPARISON:  Radiograph earlier today.  Chest CT 10/11/2019 FINDINGS: Cardiovascular: There are no filling defects within the pulmonary arteries to suggest pulmonary embolus. Upper normal heart size. Coronary artery calcifications. Aortic atherosclerosis without dissection. Left chest port in place. No pericardial effusion. Mediastinum/Nodes: There is a 12 mm right hilar node, series 4, image 52, increased in size from prior exam. There is an 11 mm low right hilar node series  4, image 58, also increased in size. No enlarged left hilar lymph nodes. 7 mm AP window nodes. No progressive mediastinal adenopathy. No visualized thyroid nodule. Lungs/Pleura: Emphysema. Small left pleural effusion with slight increase size from prior exam. Pleural fluid tracks into the inter lobar fissure. Volume loss in the left lower lobe with areas of consolidation, not significantly changed from prior. There is new patchy and ground-glass opacity in the dependent right lower lobe, perifissural right middle and right upper lobe. No right pleural effusion. Right lower lobe bronchial thickening with areas of mucous plugging. There is retained mucus within the trachea and to a lesser extent right mainstem bronchus. No pneumothorax. Upper Abdomen: No acute or expected finding. Aortic atherosclerosis. Musculoskeletal: Degenerative change in the spine. No evidence of focal lesion or acute fracture. Probable remote fracture of the mid medial left clavicle, unchanged. Review of the MIP images confirms the above findings. IMPRESSION: 1. No pulmonary embolus. 2. New patchy and ground-glass opacity in the dependent right lower lobe, perifissural right middle and right  upper lobe. Findings suspicious for pneumonia, including aspiration or atypical viral organisms such as COVID-19. 3. Small left pleural effusion with slight increase size from prior exam. Volume loss in the left lower lobe with areas of ill-defined consolidation, not significantly changed from prior. 4. Right hilar adenopathy, new from prior exam, nonspecific in this setting. Recommend attention at follow-up. 5. Debris within the trachea and to a lesser extent right mainstem bronchus. Right lower lobe bronchial thickening with areas of mucous plugging. Aortic Atherosclerosis (ICD10-I70.0) and Emphysema (ICD10-J43.9). Electronically Signed   By: Keith Rake M.D.   On: 11/23/2019 00:06   DG Chest Portable 1 View  Result Date: 11/22/2019 CLINICAL DATA:  Shortness of breath EXAM: PORTABLE CHEST 1 VIEW COMPARISON:  08/17/2019, 08/29/2019, PET CT 08/29/2019 FINDINGS: Left-sided central venous port tip over the cavoatrial region. Mild elevation of left diaphragm. Small residual left pleural effusion, decreased compared to prior radiograph. Airspace disease at the left base. Mild cardiomegaly with aortic atherosclerosis. No pneumothorax. IMPRESSION: Small residual left pleural effusion with residual airspace disease in the left lung base likely corresponding to known mass and adjacent atelectasis or pneumonia. Mild cardiomegaly. Electronically Signed   By: Donavan Foil M.D.   On: 11/22/2019 21:38        Scheduled Meds: . sodium chloride   Intravenous Once  . Chlorhexidine Gluconate Cloth  6 each Topical Daily  . diltiazem  240 mg Oral Daily  . escitalopram  10 mg Oral Daily  . folic acid  1 mg Oral Daily  . furosemide  20 mg Oral Daily  . losartan  25 mg Oral Daily  . multivitamin with minerals  1 tablet Oral Daily  . thiamine  100 mg Oral Daily   Or  . thiamine  100 mg Intravenous Daily   Continuous Infusions: . sodium chloride 100 mL/hr at 11/23/19 0324  . magnesium sulfate bolus IVPB 4 g  (11/23/19 1310)  . piperacillin-tazobactam (ZOSYN)  IV 3.375 g (11/23/19 0825)     LOS: 0 days    Time spent: 40 minutes    Sharen Hones, MD [facility]   To contact the attending provider between 7A-7P or the covering provider during after hours 7P-7A, please log into the web site [email or web address] and access using universal [place] password for that web site. If you do not have the password, please call the hospital operator.  11/23/2019, 1:15 PM

## 2019-11-23 NOTE — ED Provider Notes (Signed)
-----------------------------------------   12:36 AM on 11/23/2019 -----------------------------------------  Patient hypoxic, 90% on room air.  Tachycardic on speaking, heart rate 126.  CT scan demonstrates aspiration pneumonia.  Will order IV meropenem and discussed with hospital services for admission.   Paulette Blanch, MD 11/23/19 [phone]

## 2019-11-24 DIAGNOSIS — D61818 Other pancytopenia: Secondary | ICD-10-CM | POA: Diagnosis not present

## 2019-11-24 DIAGNOSIS — I484 Atypical atrial flutter: Secondary | ICD-10-CM | POA: Diagnosis not present

## 2019-11-24 DIAGNOSIS — J690 Pneumonitis due to inhalation of food and vomit: Secondary | ICD-10-CM | POA: Diagnosis not present

## 2019-11-24 LAB — TYPE AND SCREEN
ABO/RH(D): A POS
Antibody Screen: NEGATIVE
Unit division: 0

## 2019-11-24 LAB — CBC WITH DIFFERENTIAL/PLATELET
Abs Immature Granulocytes: 1.34 10*3/uL — ABNORMAL HIGH (ref 0.00–0.07)
Basophils Absolute: 0.1 10*3/uL (ref 0.0–0.1)
Basophils Relative: 1 %
Eosinophils Absolute: 0 10*3/uL (ref 0.0–0.5)
Eosinophils Relative: 0 %
HCT: 22.2 % — ABNORMAL LOW (ref 39.0–52.0)
Hemoglobin: 7.4 g/dL — ABNORMAL LOW (ref 13.0–17.0)
Immature Granulocytes: 15 %
Lymphocytes Relative: 12 %
Lymphs Abs: 1.1 10*3/uL (ref 0.7–4.0)
MCH: 33.3 pg (ref 26.0–34.0)
MCHC: 33.3 g/dL (ref 30.0–36.0)
MCV: 100 fL (ref 80.0–100.0)
Monocytes Absolute: 1.4 10*3/uL — ABNORMAL HIGH (ref 0.1–1.0)
Monocytes Relative: 16 %
Neutro Abs: 5 10*3/uL (ref 1.7–7.7)
Neutrophils Relative %: 56 %
Platelets: 32 10*3/uL — ABNORMAL LOW (ref 150–400)
RBC: 2.22 MIL/uL — ABNORMAL LOW (ref 4.22–5.81)
RDW: 19 % — ABNORMAL HIGH (ref 11.5–15.5)
Smear Review: DECREASED
WBC: 8.9 10*3/uL (ref 4.0–10.5)
nRBC: 1.3 % — ABNORMAL HIGH (ref 0.0–0.2)

## 2019-11-24 LAB — BASIC METABOLIC PANEL
Anion gap: 8 (ref 5–15)
BUN: 12 mg/dL (ref 8–23)
CO2: 30 mmol/L (ref 22–32)
Calcium: 8.2 mg/dL — ABNORMAL LOW (ref 8.9–10.3)
Chloride: 99 mmol/L (ref 98–111)
Creatinine, Ser: 0.66 mg/dL (ref 0.61–1.24)
GFR calc Af Amer: >60 mL/min (ref >60)
GFR calc non Af Amer: >60 mL/min (ref >60)
Glucose, Bld: 96 mg/dL (ref 70–99)
Potassium: 3.7 mmol/L (ref 3.5–5.1)
Sodium: 137 mmol/L (ref 135–145)

## 2019-11-24 LAB — IRON AND TIBC
Iron: 115 ug/dL (ref 45–182)
Saturation Ratios: 43 % — ABNORMAL HIGH (ref 17.9–39.5)
TIBC: 269 ug/dL (ref 250–450)
UIBC: 154 ug/dL

## 2019-11-24 LAB — BPAM RBC
Blood Product Expiration Date: 202108232359
ISSUE DATE / TIME: 202108142004
Unit Type and Rh: 600

## 2019-11-24 LAB — MAGNESIUM: Magnesium: 1.8 mg/dL (ref 1.7–2.4)

## 2019-11-24 LAB — PHOSPHORUS: Phosphorus: 3.9 mg/dL (ref 2.5–4.6)

## 2019-11-24 LAB — VITAMIN B12: Vitamin B-12: 1028 pg/mL — ABNORMAL HIGH (ref 180–914)

## 2019-11-24 MED ORDER — SODIUM CHLORIDE 0.9 % IV SOLN
INTRAVENOUS | Status: DC | PRN
  Administered 2019-11-24: 250 mL via INTRAVENOUS

## 2019-11-24 MED ORDER — METOPROLOL SUCCINATE ER 50 MG PO TB24
50.0000 mg | ORAL_TABLET | Freq: Every day | ORAL | Status: DC
  Administered 2019-11-24 – 2019-11-25 (×2): 50 mg via ORAL
  Filled 2019-11-24 (×2): qty 1

## 2019-11-24 NOTE — Progress Notes (Deleted)
Tele sitter called to report patient moving around a lot in bed. This RN went at time of call to patient's room. Patient rolling back and fourth in bed with incomprehensible speech. Patient scored 7 on CIWA. Administered 1mg  Ativan per PRN order.  CN Jake Samples was Air traffic controller for [employer]. I will reassess patient every 15 minutes for restless behavior relief from Ativan.

## 2019-11-24 NOTE — Progress Notes (Signed)
PROGRESS NOTE    Scott Jordan  XBM:[id number] DOB: [birth year] DOA: 11/22/2019 PCP: Patient, No Pcp Per   Chief complaint.  Altered mental status.  Brief Narrative:  Scott Jordan a 69 y.o.malewith medical history significant forlung cancer on chemotherapy, A. fib on Eliquis, HTN, history of IV drug use who was brought in by EMS following a heroin overdose. He was administered Narcan by family member at and subsequently by the fire department. By the arrival of EMS he was awake but confused and unable to contribute to the history He had some hypoxemia at time of admission, a chest CT scan showed possible aspiration pneumonia.  He was placed on Zosyn.  8/14.  Hemoglobin dropped down to 6.3, giving 1 unit PRBC.  Continue Zosyn.  Patient also had a significant tachycardia as well as hand tremor.  He was chronic alcohol drinker, start CIWA protocol.  8/15.  1 set of blood culture positive for gram positive cocci.    Assessment & Plan:   Principal Problem:   Aspiration pneumonia ([place]) Active Problems:   Small cell lung cancer ([place])   Chemotherapy-induced neutropenia ([facility])   Heroin overdose, accidental or unintentional, initial encounter ([place])   Pancytopenia ([place])   Atypical atrial flutter ([place])  #1.  Aspiration pneumonia. Secondary to heroin overdose. Condition improving, continue Zosyn. Still on 2 L oxygen for mild hypoxemia.  2.  Pancytopenia. Secondary to chemotherapy for small cell lung cancer. Received a 1 unit PRBC transfusion yesterday, hemoglobin more stable today.  3.  Altered mental status secondary to heroin overdose. Resolved.  4.  Atypical atrial flutter with rapid ventricular response. Patient still has tachycardia with minimal movement.  Continue diltiazem, I will change beta-blocker to scheduled metoprolol 50 mg daily.  5.  Alcohol withdrawal. Condition stable.  6.  Positive blood culture. Patient has just 1 set of blood culture positive, most likely  contaminant.  Final identification will be available tomorrow  DVT prophylaxis: SCDs Code Status: Full Family Communication: None Disposition Plan:   Patient came from: Home                                                                                                                           Anticipated d/c place: Home  Barriers to d/c OR conditions which need to be met to effect a safe d/c: We will discharge home tomorrow after blood cultures available.  Consultants:   None  Procedures:None Antimicrobials:  Zosyn   Subjective: Patient feels better today, still has some tachycardia with exertion.  But denies any short of breath.  He has a cough, nonproductive. No abdominal pain nausea vomiting or diarrhea. No fever chills.  Objective: Vitals:   11/24/19 0321 11/24/19 0322 11/24/19 0739 11/24/19 1144  BP:  130/83 134/85 (!) 142/96  Pulse:  86 85 85  Resp:  20 18 18   Temp:  98.6 F (37 C) 98.6 F (37 C) 98.2 F (36.8 C)  TempSrc:  Oral Oral  Oral  SpO2:  92% 97% 96%  Weight: 60.8 kg     Height:        Intake/Output Summary (Last 24 hours) at 11/24/2019 1354 Last data filed at 11/24/2019 1301 Gross per 24 hour  Intake 2047.45 ml  Output 2725 ml  Net -677.55 ml   Filed Weights   11/22/19 2102 11/23/19 0251 11/24/19 0321  Weight: 60 kg 60.3 kg 60.8 kg    Examination:  General exam: Appears calm and comfortable  Respiratory system: Clear to auscultation. Respiratory effort normal. Cardiovascular system: S1 & S2 heard, RRR. No JVD, murmurs, rubs, gallops or clicks. No pedal edema. Gastrointestinal system: Abdomen is nondistended, soft and nontender. No organomegaly or masses felt. Normal bowel sounds heard. Central nervous system: Alert and oriented. No focal neurological deficits. Extremities: Symmetric  Skin: No rashes, lesions or ulcers Psychiatry: Judgement and insight appear normal. Mood & affect appropriate.     Data Reviewed: I have personally  reviewed following labs and imaging studies  CBC: Recent Labs  Lab 11/20/19 1117 11/20/19 1117 11/22/19 1033 11/22/19 2116 11/23/19 0849 11/23/19 1334 11/24/19 0505  WBC 1.4*   < > 2.4* 8.6 7.0 8.1 8.9  NEUTROABS 0.6*  --  1.1* 3.8 4.7  --  5.0  HGB 7.4*   < > 7.7* 7.9* 6.3* 6.3* 7.4*  HCT 22.1*   < > 22.9* 23.6* 19.6* 19.6* 22.2*  MCV 98.7   < > 99.1 100.9* 104.8* 103.7* 100.0  PLT 97*   < > 55* 58* 38* 40* 32*   < > = values in this interval not displayed.   Basic Metabolic Panel: Recent Labs  Lab 11/20/19 1117 11/22/19 2116 11/23/19 0849 11/23/19 1334 11/24/19 0505  NA 137 136 136  --  137  K 3.6 3.4* 3.8  --  3.7  CL 101 98 98  --  99  CO2 28 23 27   --  30  GLUCOSE 132* 159* 121*  --  96  BUN 12 10 11   --  12  CREATININE 0.62 0.85 0.68  --  0.66  CALCIUM 8.8* 9.0 8.6*  --  8.2*  MG 1.6*  --  1.4* 1.7 1.8  PHOS  --   --   --  4.0 3.9   GFR: Estimated Creatinine Clearance: 76 mL/min (by C-G formula based on SCr of 0.66 mg/dL). Liver Function Tests: Recent Labs  Lab 11/22/19 2116  AST 27  ALT 18  ALKPHOS 113  BILITOT 0.6  PROT 7.5  ALBUMIN 3.9   No results for input(s): LIPASE, AMYLASE in the last 168 hours. Recent Labs  Lab 11/22/19 2339  AMMONIA 20   Coagulation Profile: No results for input(s): INR, PROTIME in the last 168 hours. Cardiac Enzymes: No results for input(s): CKTOTAL, CKMB, CKMBINDEX, TROPONINI in the last 168 hours. BNP (last 3 results) No results for input(s): PROBNP in the last 8760 hours. HbA1C: No results for input(s): HGBA1C in the last 72 hours. CBG: No results for input(s): GLUCAP in the last 168 hours. Lipid Profile: No results for input(s): CHOL, HDL, LDLCALC, TRIG, CHOLHDL, LDLDIRECT in the last 72 hours. Thyroid Function Tests: No results for input(s): TSH, T4TOTAL, FREET4, T3FREE, THYROIDAB in the last 72 hours. Anemia Panel: Recent Labs    11/24/19 0505  TIBC 269  IRON 115   Sepsis Labs: Recent Labs  Lab  11/23/19 0134 11/23/19 0849  PROCALCITON  --  <0.10  LATICACIDVEN 3.0* 2.9*    Recent Results (from the  past 240 hour(s))  Culture, blood (routine x 2)     Status: None (Preliminary result)   Collection Time: 11/23/19  1:34 AM   Specimen: BLOOD  Result Value Ref Range Status   Specimen Description BLOOD BLOOD RIGHT FOREARM  Final   Special Requests   Final    BOTTLES DRAWN AEROBIC AND ANAEROBIC Blood Culture adequate volume   Culture   Final    NO GROWTH 1 DAY Performed at [facility], [address] [place], [place] [address]    Report Status PENDING  Incomplete  SARS Coronavirus 2 by RT PCR (hospital order, performed in [facility] hospital lab) Nasopharyngeal Nasopharyngeal Swab     Status: None   Collection Time: 11/23/19  1:34 AM   Specimen: Nasopharyngeal Swab  Result Value Ref Range Status   SARS Coronavirus 2 NEGATIVE NEGATIVE Final    Comment: (NOTE) SARS-CoV-2 target nucleic acids are NOT DETECTED.  The SARS-CoV-2 RNA is generally detectable in upper and lower respiratory specimens during the acute phase of infection. The lowest concentration of SARS-CoV-2 viral copies this assay can detect is 250 copies / mL. A negative result does not preclude SARS-CoV-2 infection and should not be used as the sole basis for treatment or other patient management decisions.  A negative result may occur with improper specimen collection / handling, submission of specimen other than nasopharyngeal swab, presence of viral mutation(s) within the areas targeted by this assay, and inadequate number of viral copies (<250 copies / mL). A negative result must be combined with clinical observations, patient history, and epidemiological information.  Fact Sheet for Patients:   StrictlyIdeas.no  Fact Sheet for Healthcare Providers: BankingDealers.co.za  This test is not yet approved or  cleared by the [place] FDA and has  been authorized for detection and/or diagnosis of SARS-CoV-2 by FDA under an Emergency Use Authorization (EUA).  This EUA will remain in effect (meaning this test can be used) for the duration of the COVID-19 declaration under Section 564(b)(1) of the Act, 21 U.S.C. section 360bbb-3(b)(1), unless the authorization is terminated or revoked sooner.  Performed at [facility], [address] [place], [place] [address]   Culture, blood (routine x 2)     Status: None (Preliminary result)   Collection Time: 11/23/19  1:35 AM   Specimen: BLOOD  Result Value Ref Range Status   Specimen Description BLOOD BLOOD LEFT FOREARM  Final   Special Requests   Final    BOTTLES DRAWN AEROBIC AND ANAEROBIC Blood Culture adequate volume   Culture  Setup Time   Final    Organism ID to follow GRAM POSITIVE COCCI AEROBIC BOTTLE ONLY CRITICAL RESULT CALLED TO, READ BACK BY AND VERIFIED WITH: DAVID BESANTI 11/23/19 AT 2228 HS Performed at [facility], [address], [place],  [address]    Culture [facility] POSITIVE COCCI  Final   Report Status PENDING  Incomplete  Blood Culture ID Panel (Reflexed)     Status: Abnormal   Collection Time: 11/23/19  1:35 AM  Result Value Ref Range Status   Enterococcus faecalis NOT DETECTED NOT DETECTED Final   Enterococcus Faecium NOT DETECTED NOT DETECTED Final   Listeria monocytogenes NOT DETECTED NOT DETECTED Final   Staphylococcus species DETECTED (A) NOT DETECTED Final    Comment: CRITICAL RESULT CALLED TO, READ BACK BY AND VERIFIED WITH: DAVID BESANTI 11/23/19 AT 2028 HS    Staphylococcus aureus (BCID) NOT DETECTED NOT DETECTED Final   Staphylococcus epidermidis NOT DETECTED  NOT DETECTED Final   Staphylococcus lugdunensis NOT DETECTED NOT DETECTED Final   Streptococcus species NOT DETECTED NOT DETECTED Final   Streptococcus agalactiae NOT DETECTED NOT DETECTED Final   Streptococcus pneumoniae NOT DETECTED NOT DETECTED Final   Streptococcus  pyogenes NOT DETECTED NOT DETECTED Final   A.calcoaceticus-baumannii NOT DETECTED NOT DETECTED Final   Bacteroides fragilis NOT DETECTED NOT DETECTED Final   Enterobacterales NOT DETECTED NOT DETECTED Final   Enterobacter cloacae complex NOT DETECTED NOT DETECTED Final   Escherichia coli NOT DETECTED NOT DETECTED Final   Klebsiella aerogenes NOT DETECTED NOT DETECTED Final   Klebsiella oxytoca NOT DETECTED NOT DETECTED Final   Klebsiella pneumoniae NOT DETECTED NOT DETECTED Final   Proteus species NOT DETECTED NOT DETECTED Final   Salmonella species NOT DETECTED NOT DETECTED Final   Serratia marcescens NOT DETECTED NOT DETECTED Final   Haemophilus influenzae NOT DETECTED NOT DETECTED Final   Neisseria meningitidis NOT DETECTED NOT DETECTED Final   Pseudomonas aeruginosa NOT DETECTED NOT DETECTED Final   Stenotrophomonas maltophilia NOT DETECTED NOT DETECTED Final   Candida albicans NOT DETECTED NOT DETECTED Final   Candida auris NOT DETECTED NOT DETECTED Final   Candida glabrata NOT DETECTED NOT DETECTED Final   Candida krusei NOT DETECTED NOT DETECTED Final   Candida parapsilosis NOT DETECTED NOT DETECTED Final   Candida tropicalis NOT DETECTED NOT DETECTED Final   Cryptococcus neoformans/gattii NOT DETECTED NOT DETECTED Final    Comment: Performed at [facility], [address], [place], [place] [address]         Radiology Studies: CT Head Wo Contrast  Result Date: 11/22/2019 CLINICAL DATA:  Altered mental status. EXAM: CT HEAD WITHOUT CONTRAST TECHNIQUE: Contiguous axial images were obtained from the base of the skull through the vertex without intravenous contrast. COMPARISON:  MRI 08/21/2019 FINDINGS: Brain: Old lacunar infarct in the left periventricular white matter. No acute intracranial abnormality. Specifically, no hemorrhage, hydrocephalus, mass lesion, acute infarction, or significant intracranial injury. Vascular: No hyperdense vessel or unexpected  calcification. Skull: No acute calvarial abnormality. Sinuses/Orbits: Visualized paranasal sinuses and mastoids clear. Orbital soft tissues unremarkable. Other: None IMPRESSION: Old left periventricular lacunar infarct. No acute intracranial abnormality. Electronically Signed   By: Rolm Baptise M.D.   On: 11/22/2019 22:49   CT Angio Chest PE W and/or Wo Contrast  Result Date: 11/23/2019 CLINICAL DATA:  PE suspected, high prob Shortness of breath. Radiologic records demonstrate history of left lower lobe small cell lung cancer. EXAM: CT ANGIOGRAPHY CHEST WITH CONTRAST TECHNIQUE: Multidetector CT imaging of the chest was performed using the standard protocol during bolus administration of intravenous contrast. Multiplanar CT image reconstructions and MIPs were obtained to evaluate the vascular anatomy. CONTRAST:  73m OMNIPAQUE IOHEXOL 350 MG/ML SOLN COMPARISON:  Radiograph earlier today.  Chest CT 10/11/2019 FINDINGS: Cardiovascular: There are no filling defects within the pulmonary arteries to suggest pulmonary embolus. Upper normal heart size. Coronary artery calcifications. Aortic atherosclerosis without dissection. Left chest port in place. No pericardial effusion. Mediastinum/Nodes: There is a 12 mm right hilar node, series 4, image 52, increased in size from prior exam. There is an 11 mm low right hilar node series 4, image 58, also increased in size. No enlarged left hilar lymph nodes. 7 mm AP window nodes. No progressive mediastinal adenopathy. No visualized thyroid nodule. Lungs/Pleura: Emphysema. Small left pleural effusion with slight increase size from prior exam. Pleural fluid tracks into the inter lobar fissure. Volume loss in the left lower lobe with areas  of consolidation, not significantly changed from prior. There is new patchy and ground-glass opacity in the dependent right lower lobe, perifissural right middle and right upper lobe. No right pleural effusion. Right lower lobe bronchial  thickening with areas of mucous plugging. There is retained mucus within the trachea and to a lesser extent right mainstem bronchus. No pneumothorax. Upper Abdomen: No acute or expected finding. Aortic atherosclerosis. Musculoskeletal: Degenerative change in the spine. No evidence of focal lesion or acute fracture. Probable remote fracture of the mid medial left clavicle, unchanged. Review of the MIP images confirms the above findings. IMPRESSION: 1. No pulmonary embolus. 2. New patchy and ground-glass opacity in the dependent right lower lobe, perifissural right middle and right upper lobe. Findings suspicious for pneumonia, including aspiration or atypical viral organisms such as COVID-19. 3. Small left pleural effusion with slight increase size from prior exam. Volume loss in the left lower lobe with areas of ill-defined consolidation, not significantly changed from prior. 4. Right hilar adenopathy, new from prior exam, nonspecific in this setting. Recommend attention at follow-up. 5. Debris within the trachea and to a lesser extent right mainstem bronchus. Right lower lobe bronchial thickening with areas of mucous plugging. Aortic Atherosclerosis (ICD10-I70.0) and Emphysema (ICD10-J43.9). Electronically Signed   By: Keith Rake M.D.   On: 11/23/2019 00:06   DG Chest Portable 1 View  Result Date: 11/22/2019 CLINICAL DATA:  Shortness of breath EXAM: PORTABLE CHEST 1 VIEW COMPARISON:  08/17/2019, 08/29/2019, PET CT 08/29/2019 FINDINGS: Left-sided central venous port tip over the cavoatrial region. Mild elevation of left diaphragm. Small residual left pleural effusion, decreased compared to prior radiograph. Airspace disease at the left base. Mild cardiomegaly with aortic atherosclerosis. No pneumothorax. IMPRESSION: Small residual left pleural effusion with residual airspace disease in the left lung base likely corresponding to known mass and adjacent atelectasis or pneumonia. Mild cardiomegaly.  Electronically Signed   By: Donavan Foil M.D.   On: 11/22/2019 21:38        Scheduled Meds: . Chlorhexidine Gluconate Cloth  6 each Topical Daily  . diltiazem  240 mg Oral Daily  . escitalopram  10 mg Oral Daily  . feeding supplement (ENSURE ENLIVE)  237 mL Oral TID BM  . folic acid  1 mg Oral Daily  . furosemide  20 mg Oral Daily  . metoprolol succinate  50 mg Oral Daily  . multivitamin with minerals  1 tablet Oral Daily  . thiamine  100 mg Oral Daily   Or  . thiamine  100 mg Intravenous Daily   Continuous Infusions: . piperacillin-tazobactam (ZOSYN)  IV 3.375 g (11/24/19 1301)     LOS: 1 day    Time spent: 27 minutes    Sharen Hones, MD [facility]   To contact the attending provider between 7A-7P or the covering provider during after hours 7P-7A, please log into the web site [email or web address] and access using universal [place] password for that web site. If you do not have the password, please call the hospital operator.  11/24/2019, 1:54 PM

## 2019-11-25 ENCOUNTER — Inpatient Hospital Stay: Payer: Medicare Other

## 2019-11-25 ENCOUNTER — Ambulatory Visit: Payer: Medicare Other

## 2019-11-25 ENCOUNTER — Other Ambulatory Visit: Payer: Medicare Other

## 2019-11-25 DIAGNOSIS — I484 Atypical atrial flutter: Secondary | ICD-10-CM | POA: Diagnosis not present

## 2019-11-25 DIAGNOSIS — D61818 Other pancytopenia: Secondary | ICD-10-CM | POA: Diagnosis not present

## 2019-11-25 DIAGNOSIS — J690 Pneumonitis due to inhalation of food and vomit: Secondary | ICD-10-CM | POA: Diagnosis not present

## 2019-11-25 LAB — CBC WITH DIFFERENTIAL/PLATELET
Abs Immature Granulocytes: 2.04 10*3/uL — ABNORMAL HIGH (ref 0.00–0.07)
Basophils Absolute: 0.1 10*3/uL (ref 0.0–0.1)
Basophils Relative: 1 %
Eosinophils Absolute: 0 10*3/uL (ref 0.0–0.5)
Eosinophils Relative: 0 %
HCT: 22.8 % — ABNORMAL LOW (ref 39.0–52.0)
Hemoglobin: 7.7 g/dL — ABNORMAL LOW (ref 13.0–17.0)
Immature Granulocytes: 25 %
Lymphocytes Relative: 11 %
Lymphs Abs: 1 10*3/uL (ref 0.7–4.0)
MCH: 33.6 pg (ref 26.0–34.0)
MCHC: 33.8 g/dL (ref 30.0–36.0)
MCV: 99.6 fL (ref 80.0–100.0)
Monocytes Absolute: 1.1 10*3/uL — ABNORMAL HIGH (ref 0.1–1.0)
Monocytes Relative: 13 %
Neutro Abs: 4.1 10*3/uL (ref 1.7–7.7)
Neutrophils Relative %: 50 %
Platelets: 39 10*3/uL — ABNORMAL LOW (ref 150–400)
RBC: 2.29 MIL/uL — ABNORMAL LOW (ref 4.22–5.81)
RDW: 19.3 % — ABNORMAL HIGH (ref 11.5–15.5)
Smear Review: NORMAL
WBC: 8.3 10*3/uL (ref 4.0–10.5)
nRBC: 0.8 % — ABNORMAL HIGH (ref 0.0–0.2)

## 2019-11-25 LAB — CULTURE, BLOOD (ROUTINE X 2): Special Requests: ADEQUATE

## 2019-11-25 MED ORDER — METOPROLOL SUCCINATE ER 50 MG PO TB24: 50.0000 mg | ORAL_TABLET | Freq: Every day | ORAL | 0 refills | Status: DC

## 2019-11-25 MED ORDER — AMOXICILLIN-POT CLAVULANATE 875-125 MG PO TABS
1.0000 | ORAL_TABLET | Freq: Two times a day (BID) | ORAL | 0 refills | Status: AC
Start: 2019-11-25 — End: 2019-11-30

## 2019-11-25 NOTE — Discharge Summary (Signed)
Physician Discharge Summary  Patient ID: Scott Jordan MRN: [id number] DOB/AGE: 07/08/1950 69 y.o.  Admit date: 11/22/2019 Discharge date: 11/25/2019  Admission Diagnoses:  Discharge Diagnoses:  Principal Problem:   Aspiration pneumonia ([place]) Active Problems:   Small cell lung cancer ([place])   Chemotherapy-induced neutropenia ([place])   Heroin overdose, accidental or unintentional, initial encounter ([place])   Pancytopenia ([place])   Atypical atrial flutter ([place])   Discharged Condition: good  Hospital Course:  Scott Jordan a 69 y.o.malewith medical history significant forlung cancer on chemotherapy, A. fib on Eliquis, HTN, history of IV drug use who was brought in by EMS following a heroin overdose. He was administered Narcan by family member at and subsequently by the fire department. By the arrival of EMS he was awake but confused and unable to contribute to the history He had some hypoxemia at time of admission, a chest CT scan showed possible aspiration pneumonia. He was placed on Zosyn.  8/14.Hemoglobin dropped down to 6.3, giving 1 unit PRBC. Continue Zosyn. Patient also had a significant tachycardia as well as hand tremor. He was chronic alcohol drinker, start CIWA protocol.  8/15.  1 set of blood culture positive for gram positive cocci.   8/16.  Patient condition improved.  Currently he has no symptoms.  Final culture results came back with staph hominis, spoke with Dr. Ramon Dredge, 1 4 cultures positive most likely contaminants.  No need for additional work-up.  #1.  Aspiration pneumonia. Secondary to heroin overdose. Condition improving, has been treated with 3 days of Zosyn, will continue oral Augmentin. I was able to take off patient oxygen without significant desaturation.  2.  Pancytopenia. Secondary to chemotherapy for small cell lung cancer. Received 1 unit PRBC transfusion, hemoglobin more stable, no significant iron B12 deficiency.  Patient will be followed  by oncology as outpatient.  3.  Altered mental status secondary to heroin overdose. Resolved.  4.  Atypical atrial flutter with rapid ventricular response. Patient still has tachycardia with minimal movement.  Continue diltiazem, I will change beta-blocker to scheduled metoprolol 50 mg daily.  Discontinued losartan.  5.  Alcohol withdrawal. Condition stable.  6.  Positive blood culture. Most likely contaminants.  Consults: None  Significant Diagnostic Studies:  CT ANGIOGRAPHY CHEST WITH CONTRAST  TECHNIQUE: Multidetector CT imaging of the chest was performed using the standard protocol during bolus administration of intravenous contrast. Multiplanar CT image reconstructions and MIPs were obtained to evaluate the vascular anatomy.  CONTRAST:  45mL OMNIPAQUE IOHEXOL 350 MG/ML SOLN  COMPARISON:  Radiograph earlier today.  Chest CT 10/11/2019  FINDINGS: Cardiovascular: There are no filling defects within the pulmonary arteries to suggest pulmonary embolus. Upper normal heart size. Coronary artery calcifications. Aortic atherosclerosis without dissection. Left chest port in place. No pericardial effusion.  Mediastinum/Nodes: There is a 12 mm right hilar node, series 4, image 52, increased in size from prior exam. There is an 11 mm low right hilar node series 4, image 58, also increased in size. No enlarged left hilar lymph nodes. 7 mm AP window nodes. No progressive mediastinal adenopathy. No visualized thyroid nodule.  Lungs/Pleura: Emphysema. Small left pleural effusion with slight increase size from prior exam. Pleural fluid tracks into the inter lobar fissure. Volume loss in the left lower lobe with areas of consolidation, not significantly changed from prior. There is new patchy and ground-glass opacity in the dependent right lower lobe, perifissural right middle and right upper lobe. No right pleural effusion. Right lower lobe bronchial thickening with  areas  of mucous plugging. There is retained mucus within the trachea and to a lesser extent right mainstem bronchus. No pneumothorax.  Upper Abdomen: No acute or expected finding. Aortic atherosclerosis.  Musculoskeletal: Degenerative change in the spine. No evidence of focal lesion or acute fracture. Probable remote fracture of the mid medial left clavicle, unchanged.  Review of the MIP images confirms the above findings.  IMPRESSION: 1. No pulmonary embolus. 2. New patchy and ground-glass opacity in the dependent right lower lobe, perifissural right middle and right upper lobe. Findings suspicious for pneumonia, including aspiration or atypical viral organisms such as COVID-19. 3. Small left pleural effusion with slight increase size from prior exam. Volume loss in the left lower lobe with areas of ill-defined consolidation, not significantly changed from prior. 4. Right hilar adenopathy, new from prior exam, nonspecific in this setting. Recommend attention at follow-up. 5. Debris within the trachea and to a lesser extent right mainstem bronchus. Right lower lobe bronchial thickening with areas of mucous plugging.  Aortic Atherosclerosis (ICD10-I70.0) and Emphysema (ICD10-J43.9).   Electronically Signed   By: Keith Rake M.D.   On: 11/23/2019 00:06    Treatments: antibiotics: Zosyn  Discharge Exam: Blood pressure (!) 143/77, pulse 68, temperature 98.1 F (36.7 C), temperature source Oral, resp. rate 16, height 6\' 2"  (1.88 m), weight 61.1 kg, SpO2 94 %. General appearance: alert and cooperative Resp: clear to auscultation bilaterally Cardio: Regular no murmurs. GI: soft, non-tender; bowel sounds normal; no masses,  no organomegaly Extremities: extremities normal, atraumatic, no cyanosis or edema  Disposition: Discharge disposition: 01-Home or Self Care       Discharge Instructions    Ambulatory referral to Oncology   Complete by: As directed    Diet -  low sodium heart healthy   Complete by: As directed    Discharge wound care:   Complete by: As directed    Wrap before discharge   Increase activity slowly   Complete by: As directed      Allergies as of 11/25/2019   No Known Allergies     Medication List    STOP taking these medications   losartan 25 MG tablet Commonly known as: COZAAR     TAKE these medications   amoxicillin-clavulanate 875-125 MG tablet Commonly known as: Augmentin Take 1 tablet by mouth 2 (two) times daily for 5 days.   diltiazem 240 MG 24 hr capsule Commonly known as: CARDIZEM CD Take 1 capsule (240 mg total) by mouth daily.   Eliquis 5 MG Tabs tablet Generic drug: apixaban Take 5 mg by mouth 2 (two) times daily.   escitalopram 10 MG tablet Commonly known as: LEXAPRO Take 1 tablet (10 mg total) by mouth daily.   feeding supplement (ENSURE ENLIVE) Liqd Take 237 mLs by mouth 3 (three) times daily between meals.   furosemide 20 MG tablet Commonly known as: LASIX Take 1 tablet (20 mg total) by mouth daily.   HYDROcodone-acetaminophen 5-325 MG tablet Commonly known as: NORCO/VICODIN Take 1 tablet by mouth every 6 (six) hours as needed for moderate pain.   lidocaine-prilocaine cream Commonly known as: EMLA Apply to affected area once   metoprolol succinate 50 MG 24 hr tablet Commonly known as: TOPROL-XL Take 1 tablet (50 mg total) by mouth daily. Take with or immediately following a meal. Start taking on: November 26, 2019   nystatin 100000 UNIT/ML suspension Commonly known as: MYCOSTATIN Use as directed 5 mLs (500,000 Units total) in the mouth or throat 4 (four)  times daily. Swish and spit   ondansetron 8 MG tablet Commonly known as: Zofran Take 1 tablet (8 mg total) by mouth every 8 (eight) hours as needed for refractory nausea / vomiting. Start on day 3 after carboplatin chemo.   sodium chloride 1 g tablet Take 1 tablet (1 g total) by mouth 3 (three) times daily with meals.             Discharge Care Instructions  (From admission, onward)         Start     Ordered   11/25/19 0000  Discharge wound care:       Comments: Wrap before discharge   11/25/19 1414         34 minutes  Signed: Sharen Hones 11/25/2019, 2:15 PM

## 2019-11-25 NOTE — Discharge Instructions (Signed)
Accidental Drug Poisoning, Adult Accidental drug poisoning happens when a person accidentally takes too much of a substance, such as a prescription medicine, an over-the-counter medicine, a vitamin, a supplement, or an illegal drug. The effects of drug poisoning can be mild, dangerous, or even deadly. What are the causes? This condition is caused by taking too much of a medicine, illegal drug, or other substance. It often results from:  Lack of knowledge about a substance.  Using more than one substance at the same time.  An error made by the health care provider who prescribed the substance.  An error made by the pharmacist who filled the prescription.  A lapse in memory, such as forgetting that you have already taken a dose of the medicine.  Suddenly using a substance after a long period of not using it. The following substances and medicines are more likely to cause an accidental drug poisoning:  Medicines that treat mental problems (psychotropic medicines).  Pain medicines.  Cocaine.  Heroin.  Multivitamins that contain iron.  Over-the-counter cold and cough medicines. What increases the risk? This condition is more likely to occur in:  Elderly adults. Elderly adults are at risk because they may: ? Be taking many different medicines. ? Have difficulty reading labels. ? Forget when they last took their medicine.  People who use illegal drugs.  People who drink alcohol while using illegal drugs or certain medicines.  People with certain mental health conditions. What are the signs or symptoms? Symptoms of this condition depend on the substance and the amount that was taken. Common symptoms include:  Behavior changes, such as confusion.  Sleepiness.  Weakness.  Slowed breathing.  Nausea and vomiting.  Seizures.  Very large or small eye pupil size. A drug poisoning can cause a very serious condition in which your blood pressure drops to a low level (shock).  Symptoms of shock include:  Cold and clammy skin.  Pale skin.  Blue lips.  Very slow breathing.  Extreme sleepiness.  Severe confusion.  Dizziness or fainting. How is this diagnosed? This condition is diagnosed based on:  Your symptoms. You will be asked about the substances you took and when you took them.  A physical exam. You may also have other tests, including:  Urine tests.  Blood tests.  An electrocardiogram (ECG). How is this treated? This condition may need to be treated right away at the hospital. Treatment may involve:  Getting fluids and electrolytes through an IV.  Having a breathing tube inserted in your airway (endotracheal tube) to help you breathe.  Taking medicines. These may include medicines that: ? Absorb any substance that is in your digestive system. ? Block or reverse the effect of the substance that caused the drug poisoning.  Having your blood filtered through an artificial kidney machine (hemodialysis).  Ongoing counseling and mental health support. This may be provided if you used an illegal drug. Follow these instructions at home: Medicines   Take over-the-counter and prescription medicines only as told by your health care provider.  Before taking a new medicine, ask your health care provider whether the medicine: ? May cause side effects. ? Might react with other medicines.  Keep a list of all the medicines that you take, including over-the-counter medicines, vitamins, supplements, and herbs. Bring this list with you to all of your medical visits. General instructions   Drink enough fluid to keep your urine pale yellow.  If you are working with a Social worker or Education officer, community, make  sure to follow his or her instructions.  Do not drink alcohol if: ? Your health care provider tells you not to drink. ? You are pregnant, may be pregnant, or are planning to become pregnant.  If you drink alcohol, limit how much you  have: ? 0-1 drink a day for women. ? 0-2 drinks a day for men.  Be aware of how much alcohol is in your drink. In the [place] one drink equals one typical bottle of beer (12 oz), one-half glass of wine (5 oz), or one shot of hard liquor (1 oz).  Keep all follow-up visits as told by your health care provider. This is important. How is this prevented?   Get help if you are struggling with: ? Alcohol or drug use. ? Depression or another mental health problem.  Keep the phone number of your local poison control center near your phone or on your cell phone. The hotline of the [employer] is (800(437) 254-4649.  Store all medicines in safety containers that are out of the reach of children.  Read the drug inserts that come with your medicines.  Create a system for taking your medicine, such as a pillbox, that will help you avoid taking too much of the medicine.  Do not drink alcohol while taking medicines unless your health care provider approves.  Do not use illegal drugs.  Do not take medicines that are not prescribed for you. Contact a health care provider if:  Your symptoms return.  You develop new symptoms or side effects after taking a medicine.  You have questions about possible drug poisoning. Call your local poison control center at (800) (325)501-1209. Get help right away if:  You think that you or someone else may have taken too much of a substance.  You or someone else is having symptoms of drug poisoning. Summary  Accidental drug poisoning happens when a person accidentally takes too much of a substance, such as a prescription medicine, an over-the-counter medicine, a vitamin, a supplement, or an illegal drug.  The effects of drug poisoning can be mild, dangerous, or even deadly.  This condition is diagnosed based on your symptoms and a physical exam. You will be asked to tell your health care provider which substances you took and when you  took them.  This condition may need to be treated right away at the hospital. This information is not intended to replace advice given to you by your health care provider. Make sure you discuss any questions you have with your health care provider. Document Revised: 03/10/2017 Document Reviewed: 02/27/2017 Elsevier Patient Education  2020 [employer].

## 2019-11-27 ENCOUNTER — Other Ambulatory Visit: Payer: Self-pay | Admitting: Oncology

## 2019-11-28 LAB — CULTURE, BLOOD (ROUTINE X 2)
Culture: NO GROWTH
Special Requests: ADEQUATE

## 2019-11-29 ENCOUNTER — Other Ambulatory Visit: Payer: Self-pay

## 2019-11-29 ENCOUNTER — Ambulatory Visit
Admission: RE | Admit: 2019-11-29 | Discharge: 2019-11-29 | Disposition: A | Discharge status: Home / Self Care | Payer: Medicare Other | Source: Ambulatory Visit | Attending: Hematology and Oncology | Admitting: Hematology and Oncology

## 2019-11-29 DIAGNOSIS — C3490 Malignant neoplasm of unspecified part of unspecified bronchus or lung: Secondary | ICD-10-CM | POA: Diagnosis not present

## 2019-11-29 DIAGNOSIS — C7951 Secondary malignant neoplasm of bone: Secondary | ICD-10-CM | POA: Insufficient documentation

## 2019-11-29 DIAGNOSIS — I700 Atherosclerosis of aorta: Secondary | ICD-10-CM | POA: Diagnosis not present

## 2019-11-29 DIAGNOSIS — N281 Cyst of kidney, acquired: Secondary | ICD-10-CM | POA: Diagnosis not present

## 2019-11-29 DIAGNOSIS — M47816 Spondylosis without myelopathy or radiculopathy, lumbar region: Secondary | ICD-10-CM | POA: Diagnosis not present

## 2019-11-29 MED ORDER — IOHEXOL 300 MG/ML  SOLN
75.0000 mL | Freq: Once | INTRAMUSCULAR | Status: AC | PRN
  Administered 2019-11-29: 75 mL via INTRAVENOUS

## 2019-12-01 NOTE — Progress Notes (Addendum)
[facility]  [address], [address] [place], [place] [address] Phone: [phone]  Fax: [phone]   Clinic Day:  12/02/2019  Referring physician: No ref. provider found  Chief Complaint: Scott Jordan is a 69 y.o. male with extensive stage small cell lung cancer who is seen for assessment prior to cycle #5 carboplatin, etoposide, and Tecentriq.  HPI: The patient was last seen in the medical oncology clinic on 11/11/2019. At that time, he was doing well.  Exam was stable.  Hemoglobin was 8.5.  Sodium was 137.  Magneisum was 1.5.  He began cycle #4 carboplatin, etoposide and Tecentriq followed by [place] support on 11/14/2019.  He received Retacrit.  He received magnesium 2 g IV.  Salt tablets were tapered to 1 tablet twice daily.    He was admitted to [facility] via EMS from 11/22/2019 to 11/25/2019. His sister heard a thump and found him unresponsive on the floor. Due to his history of heroin use, she gave him 1 mg Narcan intranasally; First responders gave him an additional 2 mg Narcan. He admitted to using heroine at the hospital.  He had atypical atrial flutter with RVR treated with metoprolol.  Nadir counts on 11/23/2019 included a hematocrit of 19.6, hemoglobin 6.3, MCV 104.8, platelets 38,000, WBC 7000 with an ANC of 4700.  He received 1 unit PRBC. He was treated with Zosyn for aspiration pneumonia secondary to heroin overdose.  He was discharged on Augmentin x5 days.  Chest CT angiogram on 11/23/2019 revealed no pulmonary embolus.  There was new patchy and ground-glass opacity in the dependent right lower lobe, perifissural right middle and right upper lobe. Findings were suspicious for pneumonia, including aspiration or atypical viral organisms such as COVID-19. There was a small left pleural effusion with slight increase size from prior exam. Volume loss in the left lower lobe with areas of ill-defined consolidation, was not significantly changed from prior. Right hilar  adenopathy was new from prior exam, nonspecific in this setting. Recommend attention at follow-up. There was debris within the trachea and to a lesser extent right mainstem bronchus. There was right lower lobe bronchial thickening with areas of mucous plugging.   Abdomen and pelvis CT on 11/29/2019 revealed residual patchy nodular foci of consolidation at the right lung base, overall decreased since 11/22/2019 favoring resolving bronchopneumonia.  Follow-up chest CT was advised.  There was a stable small dependent left pleural effusion and stable top normal size previously hypermetabolic left periaortic lymph node.  There was stable faint sclerotic left pelvic bone lesions.  There were no new or progressive metastatic disease in the abdomen or pelvis.    During the interim, he has felt "fine".  He is eating well.  He is drinking 2 boost a day.  He is taking salt tablets 2/day.  He admits to the recent drug use.  He states that he "slipped and was depressed".  He let things bother him.  He got in a "bad spot".  He is now better and feels confident that he will stay on track.  He states that he has some black mold in his house which he is trying to take care of.  He completed his antibiotics.  He has no fever.  He is a little diarrhea after his scans for 2 days.  Diarrhea has gone today.   Past Medical History:  Diagnosis Date  . A-fib ([place]) 01/10/2019   pt st this was dx by Dr. Ubaldo Glassing  . Cancer ([place]) 2021  LUNG  . Complication of anesthesia    DIFFICULTY WAKING UP AFTER SURGERY- 20 YRS AGO  . Hypertension   . Lung cancer ([place])   . Substance abuse [facility])     Past Surgical History:  Procedure Laterality Date  . ARM DEBRIDEMENT Left    INCISION AND DEBRIDEMENT LOWER ARM -20 YRS AGO  . BACK SURGERY    . PORTACATH PLACEMENT Left 08/29/2019   Procedure: INSERTION PORT-A-CATH;  Surgeon: Nestor Lewandowsky, MD;  Location: [facility] ORS;  Service: General;  Laterality: Left;    History reviewed. No pertinent  family history.  Social History:  reports that he has quit smoking. His smoking use included cigarettes. He smoked 0.50 packs per day. He has never used smokeless tobacco. He reports current alcohol use. He reports previous drug use. Drug: Heroin. The patient denies any exposure to radiation or toxins. The patient lives in [place]. He has family friends staying with him to help care for his needs.His niece, Scott Jordan [phone]), is his medical power of attorney. The patient is alone today.   Allergies: No Known Allergies  Current Medications: Current Outpatient Medications  Medication Sig Dispense Refill  . diltiazem (CARDIZEM CD) 240 MG 24 hr capsule Take 1 capsule (240 mg total) by mouth daily. 30 capsule 2  . ELIQUIS 5 MG TABS tablet Take 5 mg by mouth 2 (two) times daily.    . feeding supplement, ENSURE ENLIVE, (ENSURE ENLIVE) LIQD Take 237 mLs by mouth 3 (three) times daily between meals. 237 mL 12  . furosemide (LASIX) 20 MG tablet Take 1 tablet (20 mg total) by mouth daily. 20 tablet 1  . HYDROcodone-acetaminophen (NORCO/VICODIN) 5-325 MG tablet Take 1 tablet by mouth every 6 (six) hours as needed for moderate pain. 30 tablet 0  . lidocaine-prilocaine (EMLA) cream Apply to affected area once 30 g 3  . metoprolol succinate (TOPROL-XL) 50 MG 24 hr tablet Take 1 tablet (50 mg total) by mouth daily. Take with or immediately following a meal. 30 tablet 0  . sodium chloride 1 g tablet Take 1 tablet (1 g total) by mouth 3 (three) times daily with meals. 90 tablet 1  . escitalopram (LEXAPRO) 10 MG tablet Take 1 tablet (10 mg total) by mouth daily. (Patient not taking: Reported on 11/22/2019) 90 tablet 0  . nystatin (MYCOSTATIN) 100000 UNIT/ML suspension Use as directed 5 mLs (500,000 Units total) in the mouth or throat 4 (four) times daily. Swish and spit (Patient not taking: Reported on 11/23/2019) 60 mL 0  . ondansetron (ZOFRAN) 8 MG tablet Take 1 tablet (8 mg total) by mouth every 8  (eight) hours as needed for refractory nausea / vomiting. Start on day 3 after carboplatin chemo. (Patient not taking: Reported on 10/03/2019) 30 tablet 1   No current facility-administered medications for this visit.   Facility-Administered Medications Ordered in Other Visits  Medication Dose Route Frequency Provider Last Rate Last Admin  . heparin lock flush 100 unit/mL  500 Units Intravenous Once Darshana Curnutt C, MD      . sodium chloride flush (NS) 0.9 % injection 10 mL  10 mL Intravenous PRN Lequita Asal, MD   10 mL at 11/11/19 7893    Review of Systems  Review of Systems  Constitutional: Negative for chills, diaphoresis, fever, malaise/fatigue and weight loss (up 3 pounds).       Feels "fine".  HENT: Negative for congestion, ear discharge, ear pain, hearing loss, nosebleeds and sore throat.   Eyes: Negative.  Negative for blurred vision and double vision.  Respiratory: Positive for shortness of breath (on exertion). Negative for cough and sputum production (some phlegm).   Cardiovascular: Negative.  Negative for chest pain, palpitations and leg swelling.  Gastrointestinal: Negative for constipation, diarrhea, heartburn, nausea and vomiting.       Eating well.  Drinking 2 boost today.  Genitourinary: Negative.  Negative for dysuria, frequency and urgency.  Musculoskeletal: Negative.  Negative for falls, myalgias and neck pain.  Skin: Negative for rash.  Neurological: Negative for dizziness, tingling, sensory change, focal weakness, weakness and headaches.  Psychiatric/Behavioral: Positive for substance abuse. Negative for depression and memory loss. The patient is not nervous/anxious and does not have insomnia.        Notes interval drug use when things bothered him.  He now feels that he is in a better place.   Performance status (ECOG):  1  Vitals Blood pressure (!) 152/91, pulse 65, temperature 98.1 F (36.7 C), temperature source Tympanic, weight 134 lb 5.9 oz (61  kg), SpO2 98 %.   Physical Exam Vitals and nursing note reviewed.  Constitutional:      General: He is not in acute distress.    Appearance: Normal appearance. He is not ill-appearing, toxic-appearing or diaphoretic.     Interventions: Face mask in place.  HENT:     Head: Normocephalic and atraumatic.     Comments: Shaved head.  Scruffy beard.    Nose: No congestion.     Mouth/Throat:     Mouth: Mucous membranes are moist.     Pharynx: No oropharyngeal exudate or posterior oropharyngeal erythema.  Eyes:     General: No scleral icterus.    Extraocular Movements: Extraocular movements intact.     Conjunctiva/sclera: Conjunctivae normal.     Pupils: Pupils are equal, round, and reactive to light.     Comments: Blue eyes  Cardiovascular:     Rate and Rhythm: Normal rate and regular rhythm.     Heart sounds: No murmur heard.  No gallop.   Pulmonary:     Breath sounds: No wheezing, rhonchi or rales.  Abdominal:     General: There is no distension.     Palpations: Abdomen is soft. There is no mass.     Tenderness: There is no abdominal tenderness. There is no guarding or rebound.  Musculoskeletal:        General: No swelling. Normal range of motion.     Cervical back: Normal range of motion.     Right lower leg: No edema.     Left lower leg: No edema.  Lymphadenopathy:     Head:     Right side of head: No preauricular, posterior auricular or occipital adenopathy.     Left side of head: No preauricular, posterior auricular or occipital adenopathy.     Cervical: No cervical adenopathy.     Upper Body:     Right upper body: No supraclavicular or axillary adenopathy.     Left upper body: No supraclavicular or axillary adenopathy.     Lower Body: No right inguinal adenopathy. No left inguinal adenopathy.  Skin:    General: Skin is warm and dry.     Findings: No bruising, erythema or rash.  Neurological:     Mental Status: He is alert and oriented to person, place, and time.      Sensory: No sensory deficit.     Motor: No weakness.  Psychiatric:        Mood  and Affect: Mood normal.        Behavior: Behavior normal.        Thought Content: Thought content normal.        Judgment: Judgment normal.     Imaging studies: 08/19/2019:  Chest CT without contrast revealed the left pleural pigtail drain. There was a small residual left hydropneumothorax. There was a 6.7 x 4 cm medial basilar left lower lobe lung masssuspicious for primary bronchogenic carcinoma. There was a lingular 1.0 cm solid pulmonary nodulesuspicious for ipsilateral metastasis. There was intralobular septal thickening and patchy groundglass opacity throughout the lingula and the left lower lobe with moderate residual atelectasis of the left lung base. A component of lymphangitic carcinomatosis was suspected. There was ipsilateral infra hilar, subcarinal, AP window, prevascular and upper retroperitoneal adenopathyc/wmetastatic disease. 08/21/2019:  Head MRI revealed no evidence of metastatic disease. 08/29/2019:  PET scan revealed markedly hypermetabolic left lower lobe pulmonary lesion (SUV 7.9) c/w the patient's known malignancy. There was associated hypermetabolic nodal metastases in the mediastinum and left hilum with metastatic involvement of left pleural space. Additional sites included upper abdominal hypermetabolic lymphadenopathy and scattered hypermetabolic bone metastases (left acetabulum, sacrum, and L5). There was interval decrease in left pleural effusion with areas of loculation. 10/11/2019:  Chest, abdomen, and pelvis CT revealed considerable reduction in size of prior hypermetabolic lymph nodes in the chest and abdomen, compatible with response to therapy. There was notably reduced size of the left pleural effusion although some complexity or loculation persists. The previous areas of hypermetabolic nodularity along the pleural surface were not obviously nodular along the pleural surface.  There was considerable reduction in size of the previously consolidated/collapsed left lower lobe, although there was still some residual indistinct opacity in this region. The previous hypermetabolic osseous metastatic lesions were relatively occult.  There was mild airway thickening was present, suggesting bronchitis or reactive airways disease. There was trace pelvic ascites, improved from prior. There was chronic deformity of the left medial clavicle likely due to an old fracture with some deformity of the left sternoclavicular joint. 11/23/2019: Chest CT angiogram revealed no pulmonary embolus.  There was new patchy and ground-glass opacity in the dependent right lower lobe, perifissural right middle and right upper lobe. Findings were suspicious for pneumonia, including aspiration or atypical viral organisms such as COVID-19. There was a small left pleural effusion with slight increase size from prior exam. Volume loss in the left lower lobe with areas of ill-defined consolidation, was not significantly changed from prior. Right hilar adenopathy was new from prior exam, nonspecific in this setting. Recommend attention at follow-up. There was debris within the trachea and to a lesser extent right mainstem bronchus. There was right lower lobe bronchial thickening with areas of mucous plugging.  11/29/2019:  Abdomen and pelvis CT revealed residual patchy nodular foci of consolidation at the right lung base, overall decreased since 11/22/2019 favoring resolving bronchopneumonia.  Follow-up chest CT was advised.  There was a stable small dependent left pleural effusion and stable top normal size previously hypermetabolic left periaortic lymph node.  There was stable faint sclerotic left pelvic bone lesions.  There were no new or progressive metastatic disease in the abdomen or pelvis.   Infusion on 12/02/2019  Component Date Value Ref Range Status  . Magnesium 12/02/2019 1.7  1.7 - 2.4 mg/dL Final    Performed at [facility], [address] [place], [place] [address]  . WBC 12/02/2019 11.8* 4.0 - 10.5 K/uL Final  .  RBC 12/02/2019 2.85* 4.22 - 5.81 MIL/uL Final  . Hemoglobin 12/02/2019 9.6* 13.0 - 17.0 g/dL Final  . HCT 12/02/2019 29.0* 39 - 52 % Final  . MCV 12/02/2019 101.8* 80.0 - 100.0 fL Final  . MCH 12/02/2019 33.7  26.0 - 34.0 pg Final  . MCHC 12/02/2019 33.1  30.0 - 36.0 g/dL Final  . RDW 12/02/2019 18.8* 11.5 - 15.5 % Final  . Platelets 12/02/2019 373  150 - 400 K/uL Final  . nRBC 12/02/2019 0.0  0.0 - 0.2 % Final   Performed at [facility], [address], [place], [place] [address]  . Neutrophils Relative % 12/02/2019 PENDING  % Incomplete  . Neutro Abs 12/02/2019 PENDING  1.7 - 7.7 K/uL Incomplete  . Band Neutrophils 12/02/2019 PENDING  % Incomplete  . Lymphocytes Relative 12/02/2019 PENDING  % Incomplete  . Lymphs Abs 12/02/2019 PENDING  0.7 - 4.0 K/uL Incomplete  . Monocytes Relative 12/02/2019 PENDING  % Incomplete  . Monocytes Absolute 12/02/2019 PENDING  0 - 1 K/uL Incomplete  . Eosinophils Relative 12/02/2019 PENDING  % Incomplete  . Eosinophils Absolute 12/02/2019 PENDING  0 - 0 K/uL Incomplete  . Basophils Relative 12/02/2019 PENDING  % Incomplete  . Basophils Absolute 12/02/2019 PENDING  0 - 0 K/uL Incomplete  . WBC Morphology 12/02/2019 PENDING   Incomplete  . RBC Morphology 12/02/2019 PENDING   Incomplete  . Smear Review 12/02/2019 PENDING   Incomplete  . Other 12/02/2019 PENDING  % Incomplete  . nRBC 12/02/2019 PENDING  0 /100 WBC Incomplete  . Metamyelocytes Relative 12/02/2019 PENDING  % Incomplete  . Myelocytes 12/02/2019 PENDING  % Incomplete  . Promyelocytes Relative 12/02/2019 PENDING  % Incomplete  . Blasts 12/02/2019 PENDING  % Incomplete  . Immature Granulocytes 12/02/2019 PENDING  % Incomplete  . Abs Immature Granulocytes 12/02/2019 PENDING  0.00 - 0.07 K/uL Incomplete  . Sodium 12/02/2019 139  135 - 145 mmol/L Final   . Potassium 12/02/2019 3.3* 3.5 - 5.1 mmol/L Final  . Chloride 12/02/2019 103  98 - 111 mmol/L Final  . CO2 12/02/2019 28  22 - 32 mmol/L Final  . Glucose, Bld 12/02/2019 141* 70 - 99 mg/dL Final   Glucose reference range applies only to samples taken after fasting for at least 8 hours.  . BUN 12/02/2019 10  8 - 23 mg/dL Final  . Creatinine, Ser 12/02/2019 0.67  0.61 - 1.24 mg/dL Final  . Calcium 12/02/2019 8.7* 8.9 - 10.3 mg/dL Final  . Total Protein 12/02/2019 6.8  6.5 - 8.1 g/dL Final  . Albumin 12/02/2019 3.1* 3.5 - 5.0 g/dL Final  . AST 12/02/2019 20  15 - 41 U/L Final  . ALT 12/02/2019 12  0 - 44 U/L Final  . Alkaline Phosphatase 12/02/2019 70  38 - 126 U/L Final  . Total Bilirubin 12/02/2019 0.6  0.3 - 1.2 mg/dL Final  . GFR calc non Af Amer 12/02/2019 >60  >60 mL/min Final  . GFR calc Af Amer 12/02/2019 >60  >60 mL/min Final  . Anion gap 12/02/2019 8  5 - 15 Final   Performed at [facility], [address], [place], [place] [address]    Assessment:  ASANI MCBURNEY is a 69 y.o. male with extensive stage small cell lung cancer. He has a >40 pack year smoking. He presented with a recurrent left sided pleural effusion. He has undergone thoracentesis x 2 in the past month. He is s/p pigtail drain. Initial cytology  x 2 was negative (lymphocytic exudative effusion).Pleural fluid cytologyon 08/18/2019 confirmed small cell lung cancer.  Chest CT without contraston 08/19/2019 revealed the left pleural pigtail drain. There was a small residual left hydropneumothorax. There was a 6.7 x 4 cm medial basilar left lower lobe lung masssuspicious for primary bronchogenic carcinoma. There was a lingular 1.0 cm solid pulmonary nodulesuspicious for ipsilateral metastasis. There was intralobular septal thickening and patchy groundglass opacity throughout the lingula and the left lower lobe with moderate residual atelectasis of the left lung base. A component of lymphangitic  carcinomatosis was suspected. There was ipsilateral infra hilar, subcarinal, AP window, prevascular and upper retroperitoneal adenopathyc/wmetastatic disease.  Head MRI on 08/21/2019 revealed no evidence of metastatic disease.  PET scan on 05/20/20201 revealed markedly hypermetabolic left lower lobe pulmonary lesion (SUV 7.9) c/w the patient's known malignancy. There was associated hypermetabolic nodal metastases in the mediastinum and left hilum with metastatic involvement of left pleural space. Additional sites included upper abdominal hypermetabolic lymphadenopathy and scattered hypermetabolic bone metastases (left acetabulum, sacrum, and L5). There was interval decrease in left pleural effusion with areas of loculation.  CEA was 3.7 on 09/02/2019.  LDH was 206 on 09/23/2019.  He is s/p 4 cycles of carboplatin, etoposide, and Tecentriq (09/02/2019- 11/11/2019) with [place] support  Chest CT angiogram on 11/23/2019 revealed no pulmonary embolus.  There was new patchy and ground-glass opacity in the dependent right lower lobe, perifissural right middle and right upper lobe. Findings were suspicious for pneumonia, including aspiration or atypical viral organisms such as COVID-19. There was a small left pleural effusion with slight increase size from prior exam. Volume loss in the left lower lobe with areas of ill-defined consolidation, was not significantly changed from prior. Right hilar adenopathy was new from prior exam, nonspecific in this setting. Recommend attention at follow-up. There was debris within the trachea and to a lesser extent right mainstem bronchus. There was right lower lobe bronchial thickening with areas of mucous plugging.   Abdomen and pelvis CT on 11/29/2019 revealed residual patchy nodular foci of consolidation at the right lung base, overall decreased since 11/22/2019 favoring resolving bronchopneumonia.  Follow-up chest CT was advised.  There was a stable small dependent  left pleural effusion and stable top normal size previously hypermetabolic left periaortic lymph node.  There was stable faint sclerotic left pelvic bone lesions.  There were no new or progressive metastatic disease in the abdomen or pelvis.  He was admitted to [facility] from 11/22/2019 - 11/25/2019 with a heroin overdose and aspiration pneumonia.  He received 1 unit of PRBCs.  He was treated with Zosyn.  He was discharged on Augmentin x5 days.  He has hyponatremiasecondary to SIADH.  He began sodium chloride tablets (1 gm TID) on 09/04/2019.  He has a significant family history of malignancy. A family member has had genetic testing (negative for Lynch syndrome).  Symptomatically, he is feeling "fine".  He denies any respiratory symptoms.  He has completed his antibiotics.  Mood is back to baseline.  Exam is stable.  Plan: 1.   Labs today:  CBC with diff, CMP, Mg, TSH. 2.   Metastatic small cell lung cancer Clinical stage T3N2M1 (stage IV). Pleural fluid cytology confirmed small cell lung cancer. Head MRIwas negative forCNS metastasis. He is currently s/p 4 cycles of carboplatin, etoposide and Tecentriq (last 11/11/2019).  Chest, abdomen, and pelvis CT on 10/11/2019 revealed a dramatic response to treatment.  Review interval chest CT angiogram plus abdomen and pelvis CT scan.  Images personally reviewed.  Agree with radiology interpretation.   He has had a dramatic response to treatment.  Review labs.  Begin cycle #1 Tecentriq maintenance.  Anticipate referral radiation oncology for PCI. 3.   Hyponatremia Sodium 139 on salt tablets 1 gm twice a day.   Continue to taper to off.  Etiology secondary to SIADH from small cell lung cancer.  Continue free water restriction and fluids with electrolytes. 4. Cancer-related pain             Patient declines any cancer related pain.  Hesitate to provide any pain medication given recent  heroin use. 5.   History of atrial fibrillation             Patient on Eliquis.             Maintain platelets >50,000 secondary to chronic anticoagulation 6.   Chemotherapy induced anemia  Hematocrit 29.0.  Hemoglobin 9.6.  MCV 101.8.   Ferritin 910 with an iron saturation 65% and a TIBC 280 on 09/16/2019.   B12 was 673 and folate 8.6 on 09/16/2019.   Retacrit today.  Aanticipate continued improvement with completion of chemotherapy. 7.   Hypokalemia  Potassium 3.3.  Rx: potassium 10 mEq p.o. daily x3 days. 8.   Maintenance cycle #1 Tecentriq today. 9.   RTC in 3 weeks for MD assessment, labs (CBC with diff, CMP, Mg, TSH), and cycle #2 Tecentriq.  I discussed the assessment and treatment plan with the patient.  The patient was provided an opportunity to ask questions and all were answered.  The patient agreed with the plan and demonstrated an understanding of the instructions.  The patient was advised to call back if the symptoms worsen or if the condition fails to improve as anticipated.   Lequita Asal, MD, PhD    12/02/2019, 9:34 AM   I, Jacqualyn Posey, am acting as a Education administrator for [employer]. Mike Gip, MD.   I, Maysen Bonsignore C. Mike Gip, MD, have reviewed the above documentation for accuracy and completeness, and I agree with the above.

## 2019-12-02 ENCOUNTER — Encounter: Payer: Self-pay | Admitting: Hematology and Oncology

## 2019-12-02 ENCOUNTER — Other Ambulatory Visit: Payer: Self-pay

## 2019-12-02 ENCOUNTER — Inpatient Hospital Stay: Payer: Medicare Other

## 2019-12-02 ENCOUNTER — Inpatient Hospital Stay (HOSPITAL_BASED_OUTPATIENT_CLINIC_OR_DEPARTMENT_OTHER): Payer: Medicare Other | Admitting: Hematology and Oncology

## 2019-12-02 ENCOUNTER — Ambulatory Visit: Payer: Medicare Other

## 2019-12-02 VITALS — BP 142/96 | HR 70 | Temp 98.0°F

## 2019-12-02 VITALS — BP 152/91 | HR 65 | Temp 98.1°F | Wt 134.4 lb

## 2019-12-02 DIAGNOSIS — D701 Agranulocytosis secondary to cancer chemotherapy: Secondary | ICD-10-CM | POA: Diagnosis not present

## 2019-12-02 DIAGNOSIS — C3490 Malignant neoplasm of unspecified part of unspecified bronchus or lung: Secondary | ICD-10-CM | POA: Diagnosis not present

## 2019-12-02 DIAGNOSIS — Z5189 Encounter for other specified aftercare: Secondary | ICD-10-CM | POA: Diagnosis not present

## 2019-12-02 DIAGNOSIS — T451X5A Adverse effect of antineoplastic and immunosuppressive drugs, initial encounter: Secondary | ICD-10-CM | POA: Diagnosis not present

## 2019-12-02 DIAGNOSIS — D6481 Anemia due to antineoplastic chemotherapy: Secondary | ICD-10-CM

## 2019-12-02 DIAGNOSIS — Z7901 Long term (current) use of anticoagulants: Secondary | ICD-10-CM | POA: Diagnosis not present

## 2019-12-02 DIAGNOSIS — E876 Hypokalemia: Secondary | ICD-10-CM

## 2019-12-02 DIAGNOSIS — Z7189 Other specified counseling: Secondary | ICD-10-CM

## 2019-12-02 DIAGNOSIS — C7951 Secondary malignant neoplasm of bone: Secondary | ICD-10-CM | POA: Diagnosis not present

## 2019-12-02 DIAGNOSIS — E871 Hypo-osmolality and hyponatremia: Secondary | ICD-10-CM

## 2019-12-02 DIAGNOSIS — I10 Essential (primary) hypertension: Secondary | ICD-10-CM | POA: Diagnosis not present

## 2019-12-02 DIAGNOSIS — Z79899 Other long term (current) drug therapy: Secondary | ICD-10-CM | POA: Diagnosis not present

## 2019-12-02 DIAGNOSIS — G893 Neoplasm related pain (acute) (chronic): Secondary | ICD-10-CM

## 2019-12-02 DIAGNOSIS — Z87891 Personal history of nicotine dependence: Secondary | ICD-10-CM | POA: Diagnosis not present

## 2019-12-02 DIAGNOSIS — I4891 Unspecified atrial fibrillation: Secondary | ICD-10-CM | POA: Diagnosis not present

## 2019-12-02 LAB — MAGNESIUM: Magnesium: 1.7 mg/dL (ref 1.7–2.4)

## 2019-12-02 LAB — CBC WITH DIFFERENTIAL/PLATELET
Abs Immature Granulocytes: 0.77 10*3/uL — ABNORMAL HIGH (ref 0.00–0.07)
Basophils Absolute: 0.1 10*3/uL (ref 0.0–0.1)
Basophils Relative: 1 %
Eosinophils Absolute: 0 10*3/uL (ref 0.0–0.5)
Eosinophils Relative: 0 %
HCT: 29 % — ABNORMAL LOW (ref 39.0–52.0)
Hemoglobin: 9.6 g/dL — ABNORMAL LOW (ref 13.0–17.0)
Immature Granulocytes: 7 %
Lymphocytes Relative: 9 %
Lymphs Abs: 1.1 10*3/uL (ref 0.7–4.0)
MCH: 33.7 pg (ref 26.0–34.0)
MCHC: 33.1 g/dL (ref 30.0–36.0)
MCV: 101.8 fL — ABNORMAL HIGH (ref 80.0–100.0)
Monocytes Absolute: 1 10*3/uL (ref 0.1–1.0)
Monocytes Relative: 9 %
Neutro Abs: 8.8 10*3/uL — ABNORMAL HIGH (ref 1.7–7.7)
Neutrophils Relative %: 74 %
Platelets: 373 10*3/uL (ref 150–400)
RBC: 2.85 MIL/uL — ABNORMAL LOW (ref 4.22–5.81)
RDW: 18.8 % — ABNORMAL HIGH (ref 11.5–15.5)
WBC: 11.8 10*3/uL — ABNORMAL HIGH (ref 4.0–10.5)
nRBC: 0 % (ref 0.0–0.2)

## 2019-12-02 LAB — COMPREHENSIVE METABOLIC PANEL
ALT: 12 U/L (ref 0–44)
AST: 20 U/L (ref 15–41)
Albumin: 3.1 g/dL — ABNORMAL LOW (ref 3.5–5.0)
Alkaline Phosphatase: 70 U/L (ref 38–126)
Anion gap: 8 (ref 5–15)
BUN: 10 mg/dL (ref 8–23)
CO2: 28 mmol/L (ref 22–32)
Calcium: 8.7 mg/dL — ABNORMAL LOW (ref 8.9–10.3)
Chloride: 103 mmol/L (ref 98–111)
Creatinine, Ser: 0.67 mg/dL (ref 0.61–1.24)
GFR calc Af Amer: >60 mL/min (ref >60)
GFR calc non Af Amer: >60 mL/min (ref >60)
Glucose, Bld: 141 mg/dL — ABNORMAL HIGH (ref 70–99)
Potassium: 3.3 mmol/L — ABNORMAL LOW (ref 3.5–5.1)
Sodium: 139 mmol/L (ref 135–145)
Total Bilirubin: 0.6 mg/dL (ref 0.3–1.2)
Total Protein: 6.8 g/dL (ref 6.5–8.1)

## 2019-12-02 LAB — TSH: TSH: 1.251 u[IU]/mL (ref 0.350–4.500)

## 2019-12-02 MED ORDER — POTASSIUM CHLORIDE CRYS ER 20 MEQ PO TBCR: 20.0000 meq | EXTENDED_RELEASE_TABLET | Freq: Every day | ORAL | 0 refills | Status: DC

## 2019-12-02 MED ORDER — SODIUM CHLORIDE 0.9 % IV SOLN
1200.0000 mg | Freq: Once | INTRAVENOUS | Status: AC
  Administered 2019-12-02: 1200 mg via INTRAVENOUS
  Filled 2019-12-02: qty 20

## 2019-12-02 MED ORDER — HEPARIN SOD (PORK) LOCK FLUSH 100 UNIT/ML IV SOLN
500.0000 [IU] | Freq: Once | INTRAVENOUS | Status: AC | PRN
  Administered 2019-12-02: 500 [IU]
  Filled 2019-12-02: qty 5

## 2019-12-02 MED ORDER — SODIUM CHLORIDE 0.9 % IV SOLN
Freq: Once | INTRAVENOUS | Status: AC
  Filled 2019-12-02: qty 250

## 2019-12-02 MED ORDER — SODIUM CHLORIDE 0.9% FLUSH
10.0000 mL | INTRAVENOUS | Status: DC | PRN
  Administered 2019-12-02: 10 mL via INTRAVENOUS
  Filled 2019-12-02: qty 10

## 2019-12-02 NOTE — Progress Notes (Signed)
B/p is a little elevated today 152/91. The patient states he has just had a change in his b/p medication.

## 2019-12-03 ENCOUNTER — Ambulatory Visit: Payer: Medicare Other

## 2019-12-03 ENCOUNTER — Other Ambulatory Visit: Payer: Self-pay | Admitting: Hematology and Oncology

## 2019-12-03 LAB — T4: T4, Total: 7.2 ug/dL (ref 4.5–12.0)

## 2019-12-03 NOTE — Telephone Encounter (Signed)
   Ref Range & Units 1 d ago 9 d ago  Potassium 3.5 - 5.1 mmol/L 3.3Low  3.7

## 2019-12-04 ENCOUNTER — Ambulatory Visit: Payer: Medicare Other

## 2019-12-05 ENCOUNTER — Inpatient Hospital Stay: Payer: Medicare Other

## 2019-12-05 ENCOUNTER — Ambulatory Visit: Payer: Medicare Other

## 2019-12-08 DIAGNOSIS — E876 Hypokalemia: Secondary | ICD-10-CM | POA: Insufficient documentation

## 2019-12-12 ENCOUNTER — Other Ambulatory Visit: Payer: Self-pay | Admitting: Hematology and Oncology

## 2019-12-12 NOTE — Telephone Encounter (Signed)
   Ref Range & Units 10 d ago  (12/02/19) 2 wk ago  (11/24/19) 2 wk ago  (11/23/19) 2 wk ago  (11/22/19)  Potassium 3.5 - 5.1 mmol/L 3.3Low  3.7  3.8  3.4Low

## 2019-12-19 NOTE — Progress Notes (Signed)
[facility]  [address], [address] [place], [place] [address] Phone: [phone]  Fax: [phone]   Clinic Day:  12/23/2019  Referring physician: No ref. provider found  Chief Complaint: Scott Jordan is a 69 y.o. male with extensive stage small cell lung cancer who is seen for assessment prior to cycle #2 maintenance Tecentriq.  HPI: The patient was last seen in the medical oncology clinic on 12/02/2019. At that time, he was feeling "fine".  He denied any respiratory symptoms.  He had completed his antibiotics.  Mood was back to baseline.  Exam was stable. Hematocrit was 29.0, hemoglobin 9.6, MCV 101.8, platelets 373,000, WBC 11,800 (ANC 8,800). Potassium was 3.3. Calcium was 8.7. Albumin was 3.1. TSH was 1.251 and free T4 was 7.2.  He received cycle #1 maintenance Tecentriq.  During the interim, he has been "good." He is trying to eat more but still has a poor appetite. He reports shortness of breath and white sputum production. He is staying away from drugs and last drank 2 weeks ago. He takes Magnesium and Potassium once daily. He takes two salt pills daily.  The patient states that he injected heroin and got his needles from the drug store. He has had hepatitis C "all his life" and it has not been treated.  The patient agrees to see Dr. Baruch Gouty for consideration of PCI.   Past Medical History:  Diagnosis Date  . A-fib ([place]) 01/10/2019   pt st this was dx by Dr. Ubaldo Glassing  . Cancer ([place]) 2021   LUNG  . Complication of anesthesia    DIFFICULTY WAKING UP AFTER SURGERY- 20 YRS AGO  . Hypertension   . Lung cancer ([place])   . Substance abuse [facility])     Past Surgical History:  Procedure Laterality Date  . ARM DEBRIDEMENT Left    INCISION AND DEBRIDEMENT LOWER ARM -20 YRS AGO  . BACK SURGERY    . PORTACATH PLACEMENT Left 08/29/2019   Procedure: INSERTION PORT-A-CATH;  Surgeon: Nestor Lewandowsky, MD;  Location: [facility] ORS;  Service: General;  Laterality: Left;     History reviewed. No pertinent family history.  Social History:  reports that he has quit smoking. His smoking use included cigarettes. He smoked 0.50 packs per day. He has never used smokeless tobacco. He reports current alcohol use. He reports previous drug use. Drug: Heroin. The patient denies any exposure to radiation or toxins. The patient lives in [place]. He has family friends staying with him to help care for his needs.His niece, [name] [phone]), is his medical power of attorney. The patient is alone today.   Allergies: No Known Allergies  Current Medications: Current Outpatient Medications  Medication Sig Dispense Refill  . diltiazem (CARDIZEM CD) 240 MG 24 hr capsule Take 1 capsule (240 mg total) by mouth daily. 30 capsule 2  . ELIQUIS 5 MG TABS tablet Take 5 mg by mouth 2 (two) times daily.    . feeding supplement, ENSURE ENLIVE, (ENSURE ENLIVE) LIQD Take 237 mLs by mouth 3 (three) times daily between meals. 237 mL 12  . furosemide (LASIX) 20 MG tablet Take 1 tablet (20 mg total) by mouth daily. 20 tablet 1  . HYDROcodone-acetaminophen (NORCO/VICODIN) 5-325 MG tablet Take 1 tablet by mouth every 6 (six) hours as needed for moderate pain. 30 tablet 0  . lidocaine-prilocaine (EMLA) cream Apply to affected area once 30 g 3  . metoprolol succinate (TOPROL-XL) 50 MG 24 hr tablet Take 1 tablet (50 mg  total) by mouth daily. Take with or immediately following a meal. 30 tablet 0  . potassium chloride SA (KLOR-CON) 20 MEQ tablet TAKE 1 TABLET BY MOUTH DAILY FOR 3 DAYS 3 tablet 0  . sodium chloride 1 g tablet Take 1 tablet (1 g total) by mouth 3 (three) times daily with meals. 90 tablet 1  . escitalopram (LEXAPRO) 10 MG tablet Take 1 tablet (10 mg total) by mouth daily. (Patient not taking: Reported on 11/22/2019) 90 tablet 0  . nystatin (MYCOSTATIN) 100000 UNIT/ML suspension Use as directed 5 mLs (500,000 Units total) in the mouth or throat 4 (four) times daily. Swish and spit  (Patient not taking: Reported on 11/23/2019) 60 mL 0  . ondansetron (ZOFRAN) 8 MG tablet Take 1 tablet (8 mg total) by mouth every 8 (eight) hours as needed for refractory nausea / vomiting. Start on day 3 after carboplatin chemo. (Patient not taking: Reported on 10/03/2019) 30 tablet 1   No current facility-administered medications for this visit.   Facility-Administered Medications Ordered in Other Visits  Medication Dose Route Frequency Provider Last Rate Last Admin  . heparin lock flush 100 unit/mL  500 Units Intravenous Once Kathyleen Radice C, MD      . sodium chloride flush (NS) 0.9 % injection 10 mL  10 mL Intravenous PRN Lequita Asal, MD   10 mL at 11/11/19 4098    Review of Systems  Constitutional: Negative for chills, diaphoresis, fever, malaise/fatigue and weight loss (up 1 lb).       Feels "good".  HENT: Negative for congestion, ear discharge, ear pain, hearing loss, nosebleeds, sinus pain, sore throat and tinnitus.   Eyes: Negative.  Negative for blurred vision and double vision.  Respiratory: Positive for sputum production (white) and shortness of breath (on exertion). Negative for cough and hemoptysis.   Cardiovascular: Negative.  Negative for chest pain, palpitations and leg swelling.  Gastrointestinal: Negative for abdominal pain, blood in stool, constipation, diarrhea, heartburn, melena, nausea and vomiting.       Poor appetite, trying to eat more.  Genitourinary: Negative.  Negative for dysuria, frequency, hematuria and urgency.  Musculoskeletal: Negative.  Negative for back pain, joint pain, myalgias and neck pain.  Skin: Negative for itching and rash.  Neurological: Negative for dizziness, tingling, sensory change, focal weakness, weakness and headaches.  Psychiatric/Behavioral: Positive for substance abuse. Negative for depression and memory loss. The patient is not nervous/anxious and does not have insomnia.        Notes interval drug use when things bothered  him.  He now feels that he is in a better place. Has not used any drugs during the interim.  All other systems reviewed and are negative.  Performance status (ECOG):  1  Vitals Blood pressure 131/78, pulse 63, temperature (!) 97.3 F (36.3 C), temperature source Tympanic, resp. rate 18, height 6\' 2"  (1.88 m), weight 135 lb 11.1 oz (61.5 kg), SpO2 99 %.   Physical Exam Vitals and nursing note reviewed.  Constitutional:      General: He is not in acute distress.    Appearance: Normal appearance. He is not ill-appearing, toxic-appearing or diaphoretic.     Interventions: Face mask in place.  HENT:     Head: Normocephalic and atraumatic.     Nose: No congestion.     Mouth/Throat:     Mouth: Mucous membranes are moist.     Pharynx: No oropharyngeal exudate or posterior oropharyngeal erythema.  Eyes:     General: No  scleral icterus.    Extraocular Movements: Extraocular movements intact.     Conjunctiva/sclera: Conjunctivae normal.     Pupils: Pupils are equal, round, and reactive to light.     Comments: Blue eyes  Cardiovascular:     Rate and Rhythm: Normal rate and regular rhythm.     Heart sounds: No murmur heard.  No gallop.   Pulmonary:     Breath sounds: No wheezing, rhonchi or rales.  Abdominal:     General: There is no distension.     Palpations: Abdomen is soft. There is no hepatomegaly, splenomegaly or mass.     Tenderness: There is no abdominal tenderness. There is no guarding or rebound.  Musculoskeletal:        General: No swelling. Normal range of motion.     Cervical back: Normal range of motion.     Right lower leg: No edema.     Left lower leg: No edema.  Lymphadenopathy:     Head:     Right side of head: No preauricular, posterior auricular or occipital adenopathy.     Left side of head: No preauricular, posterior auricular or occipital adenopathy.     Cervical: No cervical adenopathy.     Upper Body:     Right upper body: No supraclavicular or axillary  adenopathy.     Left upper body: No supraclavicular or axillary adenopathy.     Lower Body: No right inguinal adenopathy. No left inguinal adenopathy.  Skin:    General: Skin is warm and dry.     Findings: No bruising, erythema or rash.  Neurological:     Mental Status: He is alert and oriented to person, place, and time.     Sensory: No sensory deficit.     Motor: No weakness.  Psychiatric:        Mood and Affect: Mood normal.        Behavior: Behavior normal.        Thought Content: Thought content normal.        Judgment: Judgment normal.     Imaging studies: 08/19/2019:  Chest CT without contrast revealed the left pleural pigtail drain. There was a small residual left hydropneumothorax. There was a 6.7 x 4 cm medial basilar left lower lobe lung masssuspicious for primary bronchogenic carcinoma. There was a lingular 1.0 cm solid pulmonary nodulesuspicious for ipsilateral metastasis. There was intralobular septal thickening and patchy groundglass opacity throughout the lingula and the left lower lobe with moderate residual atelectasis of the left lung base. A component of lymphangitic carcinomatosis was suspected. There was ipsilateral infra hilar, subcarinal, AP window, prevascular and upper retroperitoneal adenopathyc/wmetastatic disease. 08/21/2019:  Head MRI revealed no evidence of metastatic disease. 08/29/2019:  PET scan revealed markedly hypermetabolic left lower lobe pulmonary lesion (SUV 7.9) c/w the patient's known malignancy. There was associated hypermetabolic nodal metastases in the mediastinum and left hilum with metastatic involvement of left pleural space. Additional sites included upper abdominal hypermetabolic lymphadenopathy and scattered hypermetabolic bone metastases (left acetabulum, sacrum, and L5). There was interval decrease in left pleural effusion with areas of loculation. 10/11/2019:  Chest, abdomen, and pelvis CT revealed considerable reduction in size of  prior hypermetabolic lymph nodes in the chest and abdomen, compatible with response to therapy. There was notably reduced size of the left pleural effusion although some complexity or loculation persists. The previous areas of hypermetabolic nodularity along the pleural surface were not obviously nodular along the pleural surface. There was considerable reduction in size  of the previously consolidated/collapsed left lower lobe, although there was still some residual indistinct opacity in this region. The previous hypermetabolic osseous metastatic lesions were relatively occult.  There was mild airway thickening was present, suggesting bronchitis or reactive airways disease. There was trace pelvic ascites, improved from prior. There was chronic deformity of the left medial clavicle likely due to an old fracture with some deformity of the left sternoclavicular joint. 11/23/2019: Chest CT angiogram revealed no pulmonary embolus.  There was new patchy and ground-glass opacity in the dependent right lower lobe, perifissural right middle and right upper lobe. Findings were suspicious for pneumonia, including aspiration or atypical viral organisms such as COVID-19. There was a small left pleural effusion with slight increase size from prior exam. Volume loss in the left lower lobe with areas of ill-defined consolidation, was not significantly changed from prior. Right hilar adenopathy was new from prior exam, nonspecific in this setting. Recommend attention at follow-up. There was debris within the trachea and to a lesser extent right mainstem bronchus. There was right lower lobe bronchial thickening with areas of mucous plugging.  11/29/2019:  Abdomen and pelvis CT revealed residual patchy nodular foci of consolidation at the right lung base, overall decreased since 11/22/2019 favoring resolving bronchopneumonia.  Follow-up chest CT was advised.  There was a stable small dependent left pleural effusion and stable top  normal size previously hypermetabolic left periaortic lymph node.  There was stable faint sclerotic left pelvic bone lesions.  There were no new or progressive metastatic disease in the abdomen or pelvis.   Infusion on 12/23/2019  Component Date Value Ref Range Status  . Sodium 12/23/2019 135  135 - 145 mmol/L Final  . Potassium 12/23/2019 3.8  3.5 - 5.1 mmol/L Final  . Chloride 12/23/2019 101  98 - 111 mmol/L Final  . CO2 12/23/2019 27  22 - 32 mmol/L Final  . Glucose, Bld 12/23/2019 111* 70 - 99 mg/dL Final   Glucose reference range applies only to samples taken after fasting for at least 8 hours.  . BUN 12/23/2019 10  8 - 23 mg/dL Final  . Creatinine, Ser 12/23/2019 0.65  0.61 - 1.24 mg/dL Final  . Calcium 12/23/2019 8.7* 8.9 - 10.3 mg/dL Final  . Total Protein 12/23/2019 6.9  6.5 - 8.1 g/dL Final  . Albumin 12/23/2019 3.4* 3.5 - 5.0 g/dL Final  . AST 12/23/2019 134* 15 - 41 U/L Final  . ALT 12/23/2019 132* 0 - 44 U/L Final  . Alkaline Phosphatase 12/23/2019 85  38 - 126 U/L Final  . Total Bilirubin 12/23/2019 0.3  0.3 - 1.2 mg/dL Final  . GFR calc non Af Amer 12/23/2019 >60  >60 mL/min Final  . GFR calc Af Amer 12/23/2019 >60  >60 mL/min Final  . Anion gap 12/23/2019 7  5 - 15 Final   Performed at [facility], [address] [place], [place] [address]  . WBC 12/23/2019 6.9  4.0 - 10.5 K/uL Final  . RBC 12/23/2019 3.36* 4.22 - 5.81 MIL/uL Final  . Hemoglobin 12/23/2019 11.7* 13.0 - 17.0 g/dL Final  . HCT 12/23/2019 33.9* 39 - 52 % Final  . MCV 12/23/2019 100.9* 80.0 - 100.0 fL Final  . MCH 12/23/2019 34.8* 26.0 - 34.0 pg Final  . MCHC 12/23/2019 34.5  30.0 - 36.0 g/dL Final  . RDW 12/23/2019 15.3  11.5 - 15.5 % Final  . Platelets 12/23/2019 152  150 - 400 K/uL Final  . nRBC 12/23/2019 0.0  0.0 - 0.2 % Final  . Neutrophils Relative % 12/23/2019 66  % Final  . Neutro Abs 12/23/2019 4.6  1.7 - 7.7 K/uL Final  . Lymphocytes Relative 12/23/2019 17  % Final  . Lymphs  Abs 12/23/2019 1.2  0.7 - 4.0 K/uL Final  . Monocytes Relative 12/23/2019 10  % Final  . Monocytes Absolute 12/23/2019 0.7  0 - 1 K/uL Final  . Eosinophils Relative 12/23/2019 6  % Final  . Eosinophils Absolute 12/23/2019 0.4  0 - 0 K/uL Final  . Basophils Relative 12/23/2019 0  % Final  . Basophils Absolute 12/23/2019 0.0  0 - 0 K/uL Final  . Immature Granulocytes 12/23/2019 1  % Final  . Abs Immature Granulocytes 12/23/2019 0.08* 0.00 - 0.07 K/uL Final   Performed at [facility], [address] [place], [place] [address]  . Magnesium 12/23/2019 1.5* 1.7 - 2.4 mg/dL Final   Performed at [facility], [address] [place], [place] [address]    Assessment:  Scott Jordan is a 69 y.o. male with extensive stage small cell lung cancer. He has a >40 pack year smoking. He presented with a recurrent left sided pleural effusion. He has undergone thoracentesis x 2 in the past month. He is s/p pigtail drain. Initial cytology x 2 was negative (lymphocytic exudative effusion).Pleural fluid cytologyon 08/18/2019 confirmed small cell lung cancer.  Chest CT without contraston 08/19/2019 revealed the left pleural pigtail drain. There was a small residual left hydropneumothorax. There was a 6.7 x 4 cm medial basilar left lower lobe lung masssuspicious for primary bronchogenic carcinoma. There was a lingular 1.0 cm solid pulmonary nodulesuspicious for ipsilateral metastasis. There was intralobular septal thickening and patchy groundglass opacity throughout the lingula and the left lower lobe with moderate residual atelectasis of the left lung base. A component of lymphangitic carcinomatosis was suspected. There was ipsilateral infra hilar, subcarinal, AP window, prevascular and upper retroperitoneal adenopathyc/wmetastatic disease.  Head MRI on 08/21/2019 revealed no evidence of metastatic disease.  PET scan on 05/20/20201 revealed markedly hypermetabolic left  lower lobe pulmonary lesion (SUV 7.9) c/w the patient's known malignancy. There was associated hypermetabolic nodal metastases in the mediastinum and left hilum with metastatic involvement of left pleural space. Additional sites included upper abdominal hypermetabolic lymphadenopathy and scattered hypermetabolic bone metastases (left acetabulum, sacrum, and L5). There was interval decrease in left pleural effusion with areas of loculation.  CEA was 3.7 on 09/02/2019.  LDH was 206 on 09/23/2019.  He received 4 cycles of carboplatin, etoposide, and Tecentriq (09/02/2019- 11/11/2019) with Margarette Canada support.  He is s/p cycle #1 maintenance Tecentriq (12/02/2019).  Chest CT angiogram on 11/23/2019 revealed no pulmonary embolus.  There was new patchy and ground-glass opacity in the dependent right lower lobe, perifissural right middle and right upper lobe. Findings were suspicious for pneumonia, including aspiration or atypical viral organisms such as COVID-19. There was a small left pleural effusion with slight increase size from prior exam. Volume loss in the left lower lobe with areas of ill-defined consolidation, was not significantly changed from prior. Right hilar adenopathy was new from prior exam, nonspecific in this setting. Recommend attention at follow-up. There was debris within the trachea and to a lesser extent right mainstem bronchus. There was right lower lobe bronchial thickening with areas of mucous plugging.   Abdomen and pelvis CT on 11/29/2019 revealed residual patchy nodular foci of consolidation at the right lung base, overall decreased since 11/22/2019 favoring resolving bronchopneumonia.  Follow-up chest CT was advised.  There was a stable small dependent left pleural effusion and stable top normal size previously hypermetabolic left periaortic lymph node.  There was stable faint sclerotic left pelvic bone lesions.  There were no new or progressive metastatic disease in the abdomen or  pelvis.  He was admitted to [facility] from 11/22/2019 - 11/25/2019 with a heroin overdose and aspiration pneumonia.  He received 1 unit of PRBCs.  He was treated with Zosyn.  He was discharged on Augmentin x5 days.  He has hyponatremiasecondary to SIADH.  He began sodium chloride tablets (1 gm TID) on 09/04/2019.  He has a significant family history of malignancy. A family member has had genetic testing (negative for Lynch syndrome).  Symptomatically, he is doing well.  He denies any new medications or herbal products.  He denies any interval drug use.  Exam is stable.  LFTs are elevated.  Plan: 1.   Labs today: CBC with diff, CMP, Mg, TSH. 2.   Metastatic small cell lung cancer Clinical stage T3N2M1 (stage IV). He is currently s/p 4 cycles of carboplatin, etoposide and Tecentriq (last 11/11/2019).  Chest, abdomen, and pelvis CT on 10/11/2019 revealed a dramatic response to treatment.  Chest CT angiogram plus abdomen and pelvis CT scan revealed a dramatic response to treatment.  He is s/p cycle #1 maintenance Tecentriq (12/02/2019).  Labs reviewed.  Postpone cycle #2 maintenance Tecentriq secondary to increased LFTs.   Suspect etiology is secondary to possible hepatitis rather than Tecentriq induced hepatitis.  Discuss referral to Dr. Baruch Gouty for consideration of PCI.   Patient in agreement. 3.   Hyponatremia Sodium 135 on salt tablets 1 gm twice a day.  Decrease salt tabs to 1 g a day.  Etiology secondary to SIADH from small cell lung cancer.  Continue free water restriction and fluids with electrolytes. 4. History of atrial fibrillation             Patient on Eliquis.             Patient denies any bleeding. 5.   Chemotherapy induced anemia  Hematocrit 33.9.  Hemoglobin 11.7.  MCV 100.9.   Ferritin 910 with an iron saturation 65% and a TIBC 280 on 09/16/2019.   B12 was 673 and folate 8.6 on 09/16/2019.   Continue to monitor. 6.   Hypokalemia,  resolved  Potassium 3.8.  Patient off oral potassium. 7.   Hypomagnesemia  Magnesium 1.5.  Magnesium 2 g IV today. 8.   Elevated LFTs  AST   20.  ALT   12.  Bilirubin 0.6.  Alkaline phosphatase 70 on 12/02/2019.  AST 134.  ALT 132.  Bilirubin 0.3.  Alkaline phosphatase 85 on 12/23/2019.  Etiology secondary to viral hepatitis versus immune mediated hepatitis.  Labs today to include acute hepatitis panel, hepatitis B surface antigen, hepatitis B core antibody.  He has a history of untreated hepatitis C. 9.   No Tecentriq today secondary to increase in LFTs. 10.   Magnesium 2 gm IV today. 11.   Consult radiation oncology. 12.   RTC in 1 week for MD assessment, labs (CBC with diff, CMP, hepatitis C RNA quantitative reflex genotype), review testing, and +/- Tecentriq.  I discussed the assessment and treatment plan with the patient.  The patient was provided an opportunity to ask questions and all were answered.  The patient agreed with the plan and demonstrated an understanding of the instructions.  The patient was advised to call back if the  symptoms worsen or if the condition fails to improve as anticipated.   Lequita Asal, MD, PhD    12/23/2019, 9:39 AM  I, Mirian Mo Tufford, am acting as a Education administrator for [employer]. Mike Gip, MD.   I, Doreena Maulden C. Mike Gip, MD, have reviewed the above documentation for accuracy and completeness, and I agree with the above.

## 2019-12-23 ENCOUNTER — Encounter: Payer: Self-pay | Admitting: Hematology and Oncology

## 2019-12-23 ENCOUNTER — Inpatient Hospital Stay: Payer: Medicare Other | Attending: Hematology and Oncology | Admitting: Hematology and Oncology

## 2019-12-23 ENCOUNTER — Inpatient Hospital Stay: Payer: Medicare Other

## 2019-12-23 ENCOUNTER — Other Ambulatory Visit: Payer: Self-pay

## 2019-12-23 ENCOUNTER — Ambulatory Visit: Payer: Medicare Other

## 2019-12-23 VITALS — BP 131/78 | HR 63 | Temp 97.3°F | Resp 18 | Ht 74.0 in | Wt 135.7 lb

## 2019-12-23 DIAGNOSIS — E871 Hypo-osmolality and hyponatremia: Secondary | ICD-10-CM | POA: Diagnosis not present

## 2019-12-23 DIAGNOSIS — R7989 Other specified abnormal findings of blood chemistry: Secondary | ICD-10-CM

## 2019-12-23 DIAGNOSIS — Z5112 Encounter for antineoplastic immunotherapy: Secondary | ICD-10-CM | POA: Insufficient documentation

## 2019-12-23 DIAGNOSIS — T451X5A Adverse effect of antineoplastic and immunosuppressive drugs, initial encounter: Secondary | ICD-10-CM

## 2019-12-23 DIAGNOSIS — Z7189 Other specified counseling: Secondary | ICD-10-CM

## 2019-12-23 DIAGNOSIS — E876 Hypokalemia: Secondary | ICD-10-CM

## 2019-12-23 DIAGNOSIS — I4891 Unspecified atrial fibrillation: Secondary | ICD-10-CM | POA: Diagnosis not present

## 2019-12-23 DIAGNOSIS — I10 Essential (primary) hypertension: Secondary | ICD-10-CM | POA: Insufficient documentation

## 2019-12-23 DIAGNOSIS — Z7901 Long term (current) use of anticoagulants: Secondary | ICD-10-CM

## 2019-12-23 DIAGNOSIS — D6481 Anemia due to antineoplastic chemotherapy: Secondary | ICD-10-CM

## 2019-12-23 DIAGNOSIS — B1920 Unspecified viral hepatitis C without hepatic coma: Secondary | ICD-10-CM | POA: Diagnosis not present

## 2019-12-23 DIAGNOSIS — Z87891 Personal history of nicotine dependence: Secondary | ICD-10-CM | POA: Diagnosis not present

## 2019-12-23 DIAGNOSIS — C3490 Malignant neoplasm of unspecified part of unspecified bronchus or lung: Secondary | ICD-10-CM

## 2019-12-23 DIAGNOSIS — E222 Syndrome of inappropriate secretion of antidiuretic hormone: Secondary | ICD-10-CM | POA: Diagnosis not present

## 2019-12-23 DIAGNOSIS — C7951 Secondary malignant neoplasm of bone: Secondary | ICD-10-CM

## 2019-12-23 DIAGNOSIS — Z79899 Other long term (current) drug therapy: Secondary | ICD-10-CM | POA: Insufficient documentation

## 2019-12-23 LAB — CBC WITH DIFFERENTIAL/PLATELET
Abs Immature Granulocytes: 0.08 10*3/uL — ABNORMAL HIGH (ref 0.00–0.07)
Basophils Absolute: 0 10*3/uL (ref 0.0–0.1)
Basophils Relative: 0 %
Eosinophils Absolute: 0.4 10*3/uL (ref 0.0–0.5)
Eosinophils Relative: 6 %
HCT: 33.9 % — ABNORMAL LOW (ref 39.0–52.0)
Hemoglobin: 11.7 g/dL — ABNORMAL LOW (ref 13.0–17.0)
Immature Granulocytes: 1 %
Lymphocytes Relative: 17 %
Lymphs Abs: 1.2 10*3/uL (ref 0.7–4.0)
MCH: 34.8 pg — ABNORMAL HIGH (ref 26.0–34.0)
MCHC: 34.5 g/dL (ref 30.0–36.0)
MCV: 100.9 fL — ABNORMAL HIGH (ref 80.0–100.0)
Monocytes Absolute: 0.7 10*3/uL (ref 0.1–1.0)
Monocytes Relative: 10 %
Neutro Abs: 4.6 10*3/uL (ref 1.7–7.7)
Neutrophils Relative %: 66 %
Platelets: 152 10*3/uL (ref 150–400)
RBC: 3.36 MIL/uL — ABNORMAL LOW (ref 4.22–5.81)
RDW: 15.3 % (ref 11.5–15.5)
WBC: 6.9 10*3/uL (ref 4.0–10.5)
nRBC: 0 % (ref 0.0–0.2)

## 2019-12-23 LAB — HEPATITIS B CORE ANTIBODY, TOTAL: Hep B Core Total Ab: REACTIVE — AB

## 2019-12-23 LAB — HEPATITIS PANEL, ACUTE
HCV Ab: REACTIVE — AB
Hep A IgM: NONREACTIVE
Hep B C IgM: NONREACTIVE
Hepatitis B Surface Ag: NONREACTIVE

## 2019-12-23 LAB — HEPATITIS B SURFACE ANTIGEN: Hepatitis B Surface Ag: NONREACTIVE

## 2019-12-23 LAB — COMPREHENSIVE METABOLIC PANEL
ALT: 132 U/L — ABNORMAL HIGH (ref 0–44)
AST: 134 U/L — ABNORMAL HIGH (ref 15–41)
Albumin: 3.4 g/dL — ABNORMAL LOW (ref 3.5–5.0)
Alkaline Phosphatase: 85 U/L (ref 38–126)
Anion gap: 7 (ref 5–15)
BUN: 10 mg/dL (ref 8–23)
CO2: 27 mmol/L (ref 22–32)
Calcium: 8.7 mg/dL — ABNORMAL LOW (ref 8.9–10.3)
Chloride: 101 mmol/L (ref 98–111)
Creatinine, Ser: 0.65 mg/dL (ref 0.61–1.24)
GFR calc Af Amer: >60 mL/min (ref >60)
GFR calc non Af Amer: >60 mL/min (ref >60)
Glucose, Bld: 111 mg/dL — ABNORMAL HIGH (ref 70–99)
Potassium: 3.8 mmol/L (ref 3.5–5.1)
Sodium: 135 mmol/L (ref 135–145)
Total Bilirubin: 0.3 mg/dL (ref 0.3–1.2)
Total Protein: 6.9 g/dL (ref 6.5–8.1)

## 2019-12-23 LAB — MAGNESIUM: Magnesium: 1.5 mg/dL — ABNORMAL LOW (ref 1.7–2.4)

## 2019-12-23 LAB — TSH: TSH: 1.709 u[IU]/mL (ref 0.350–4.500)

## 2019-12-23 MED ORDER — HEPARIN SOD (PORK) LOCK FLUSH 100 UNIT/ML IV SOLN
500.0000 [IU] | Freq: Once | INTRAVENOUS | Status: AC
  Administered 2019-12-23: 500 [IU] via INTRAVENOUS
  Filled 2019-12-23: qty 5

## 2019-12-23 MED ORDER — MAGNESIUM SULFATE 2 GM/50ML IV SOLN
2.0000 g | Freq: Once | INTRAVENOUS | Status: AC
  Administered 2019-12-23: 2 g via INTRAVENOUS
  Filled 2019-12-23: qty 50

## 2019-12-23 MED ORDER — SODIUM CHLORIDE 0.9 % IV SOLN
INTRAVENOUS | Status: DC
  Filled 2019-12-23: qty 250

## 2019-12-23 NOTE — Progress Notes (Signed)
Coughing today with assesment

## 2019-12-29 NOTE — Progress Notes (Signed)
[facility]  [address] [place], [place] [address] Phone: [phone]  Fax: [phone]   Clinic Day:  12/30/2019  Referring physician: No ref. provider found  Chief Complaint: Scott Jordan is a 69 y.o. male with extensive stage small cell lung cancer who is seen for assessment prior to cycle #2 maintenance Tecentriq.  HPI: The patient was last seen in the medical oncology clinic on 12/23/2019. At that time, he was feeling "fine".  He denied any respiratory symptoms.  He had completed his antibiotics.  Mood was back to baseline.  Exam was stable. Hematocrit 33.9, hemoglobin 11.7, MCV 100.9, platelets 152,000, WBC 6900, ANC 4600.  Magnesium 1.5.  AST was 134 and ALT 132.  He received IV magnesium.  Tecentriq was held secondary to elevated liver function tests.  Hepatitis Jordan antibody and hepatitis B core antibody total were positive.  Hepatitis B surface antigen, hepatitis B core IgM and hepatitis A IgM were negative.  During the interim, he notes that he ran out of blood pressure medication yesterday. His current blood pressure medications were prescribed by a Hospital doctor.  He denies having a PCP.   He takes his salt tablets once per day. He has not been drinking anything with electrolytes. He notes that the last time he had alcohol was about 2 weeks ago. He denies any drug use.   He has an appointment with Scott Jordan, tomorrow 12/31/2019, for a radiation consultation.    Past Medical History:  Diagnosis Date  . A-fib ([place]) 01/10/2019   pt st this was dx by Dr. Ubaldo Jordan  . Cancer ([place]) 2021   LUNG  . Complication of anesthesia    DIFFICULTY WAKING UP AFTER SURGERY- 20 YRS AGO  . Hypertension   . Lung cancer ([place])   . Substance abuse [facility])     Past Surgical History:  Procedure Laterality Date  . ARM DEBRIDEMENT Left    INCISION AND DEBRIDEMENT LOWER ARM -20 YRS AGO  . BACK SURGERY    . PORTACATH PLACEMENT Left 08/29/2019   Procedure:  INSERTION PORT-A-CATH;  Surgeon: Scott Lewandowsky, Scott Jordan;  Location: [facility] ORS;  Service: General;  Laterality: Left;    History reviewed. No pertinent family history.  Social History:  reports that he has quit smoking. His smoking use included cigarettes. He smoked 0.50 packs per day. He has never used smokeless tobacco. He reports current alcohol use. He reports previous drug use. Drug: Heroin. The patient denies any exposure to radiation or toxins. The patient lives in [place]. He is dependent on public transportation or transportation from friends/family. He has family friends staying with him to help care for his needs.His niece, Scott Jordan [phone]), is his medical power of attorney. The patient is alone today.    Allergies: No Known Allergies  Current Medications: Current Outpatient Medications  Medication Sig Dispense Refill  . diltiazem (CARDIZEM CD) 240 MG 24 hr capsule Take 1 capsule (240 mg total) by mouth daily. 30 capsule 2  . ELIQUIS 5 MG TABS tablet Take 5 mg by mouth 2 (two) times daily.    . feeding supplement, ENSURE ENLIVE, (ENSURE ENLIVE) LIQD Take 237 mLs by mouth 3 (three) times daily between meals. 237 mL 12  . furosemide (LASIX) 20 MG tablet Take 1 tablet (20 mg total) by mouth daily. 20 tablet 1  . HYDROcodone-acetaminophen (NORCO/VICODIN) 5-325 MG tablet Take 1 tablet by mouth every 6 (six) hours as needed for moderate pain. 30 tablet 0  .  lidocaine-prilocaine (EMLA) cream Apply to affected area once 30 g 3  . Magnesium 500 MG CAPS Take 500 mg by mouth daily.    . metoprolol succinate (TOPROL-XL) 50 MG 24 hr tablet Take 1 tablet (50 mg total) by mouth daily. Take with or immediately following a meal. 30 tablet 0  . potassium chloride SA (KLOR-CON) 20 MEQ tablet TAKE 1 TABLET BY MOUTH DAILY FOR 3 DAYS (Patient taking differently: Take 20 mEq by mouth daily. ) 3 tablet 0  . sodium chloride 1 g tablet Take 1 tablet (1 g total) by mouth 3 (three) times daily with meals.  90 tablet 1  . escitalopram (LEXAPRO) 10 MG tablet Take 1 tablet (10 mg total) by mouth daily. (Patient not taking: Reported on 11/22/2019) 90 tablet 0  . nystatin (MYCOSTATIN) 100000 UNIT/ML suspension Use as directed 5 mLs (500,000 Units total) in the mouth or throat 4 (four) times daily. Swish and spit (Patient not taking: Reported on 11/23/2019) 60 mL 0  . ondansetron (ZOFRAN) 8 MG tablet Take 1 tablet (8 mg total) by mouth every 8 (eight) hours as needed for refractory nausea / vomiting. Start on day 3 after carboplatin chemo. (Patient not taking: Reported on 10/03/2019) 30 tablet 1   No current facility-administered medications for this visit.   Facility-Administered Medications Ordered in Other Visits  Medication Dose Route Frequency Provider Last Rate Last Admin  . heparin lock flush 100 unit/mL  500 Units Intravenous Once Scott Jordan, Scott Jordan, Scott Jordan      . sodium chloride flush (NS) 0.9 % injection 10 mL  10 mL Intravenous PRN Scott Asal, Scott Jordan   10 mL at 11/11/19 2706    Review of Systems  Review of Systems  Constitutional: Positive for weight loss (down 2 lbs). Negative for chills, diaphoresis, fever and malaise/fatigue.       Feels "fine".  HENT: Negative for congestion, ear discharge, ear pain, hearing loss, nosebleeds and sore throat.   Eyes: Negative.  Negative for blurred vision and double vision.  Respiratory: Positive for shortness of breath (on exertion). Negative for cough and sputum production (some phlegm).   Cardiovascular: Negative.  Negative for chest pain, palpitations and leg swelling.  Gastrointestinal: Negative for constipation, diarrhea, heartburn, nausea and vomiting.       Eating well.  Drinking 2 boost today.  Genitourinary: Negative.  Negative for dysuria, frequency and urgency.  Musculoskeletal: Negative.  Negative for falls, myalgias and neck pain.  Skin: Negative for rash.  Neurological: Negative for dizziness, tingling, sensory change, focal weakness,  weakness and headaches.  Psychiatric/Behavioral: Negative for depression, memory loss and substance abuse (No ETOH for 2 weeks). The patient is not nervous/anxious and does not have insomnia.        Notes interval drug use when things bothered him.  He now feels that he is in a better place.   Performance status (ECOG): 1 - Symptomatic but completely ambulatory   Vitals Blood pressure (!) 153/108, pulse (!) 117, temperature 97.9 F (36.6 Jordan), temperature source Tympanic, weight 133 lb 2.5 oz (60.4 kg), SpO2 98 %.   Physical Exam Vitals and nursing note reviewed.  Constitutional:      General: He is not in acute distress.    Appearance: Normal appearance. He is not ill-appearing, toxic-appearing or diaphoretic.     Interventions: Face mask in place.  HENT:     Head: Normocephalic and atraumatic.     Comments: Shaved head.  Scruffy beard.    Nose:  No congestion.     Mouth/Throat:     Mouth: Mucous membranes are moist.     Pharynx: No oropharyngeal exudate or posterior oropharyngeal erythema.  Eyes:     General: No scleral icterus.    Extraocular Movements: Extraocular movements intact.     Conjunctiva/sclera: Conjunctivae normal.     Pupils: Pupils are equal, round, and reactive to light.     Comments: Blue eyes.  Cardiovascular:     Rate and Rhythm: Normal rate and regular rhythm.     Heart sounds: No murmur heard.  No gallop.   Pulmonary:     Breath sounds: No wheezing, rhonchi or rales.  Abdominal:     General: There is no distension.     Palpations: Abdomen is soft. There is no hepatomegaly, splenomegaly or mass.     Tenderness: There is no abdominal tenderness. There is no guarding or rebound.  Musculoskeletal:        General: No swelling. Normal range of motion.     Cervical back: Normal range of motion.     Right lower leg: No edema.     Left lower leg: No edema.  Lymphadenopathy:     Head:     Right side of head: No preauricular, posterior auricular or occipital  adenopathy.     Left side of head: No preauricular, posterior auricular or occipital adenopathy.     Cervical: No cervical adenopathy.     Upper Body:     Right upper body: No supraclavicular or axillary adenopathy.     Left upper body: No supraclavicular or axillary adenopathy.     Lower Body: No right inguinal adenopathy. No left inguinal adenopathy.  Skin:    General: Skin is warm and dry.     Findings: No bruising, erythema or rash.  Neurological:     Mental Status: He is alert and oriented to person, place, and time.     Sensory: No sensory deficit.     Motor: No weakness.  Psychiatric:        Mood and Affect: Mood normal.        Behavior: Behavior normal.        Thought Content: Thought content normal.        Judgment: Judgment normal.     Imaging studies: 08/19/2019:  Chest CT without contrast revealed the left pleural pigtail drain. There was a small residual left hydropneumothorax. There was a 6.7 x 4 cm medial basilar left lower lobe lung masssuspicious for primary bronchogenic carcinoma. There was a lingular 1.0 cm solid pulmonary nodulesuspicious for ipsilateral metastasis. There was intralobular septal thickening and patchy groundglass opacity throughout the lingula and the left lower lobe with moderate residual atelectasis of the left lung base. A component of lymphangitic carcinomatosis was suspected. There was ipsilateral infra hilar, subcarinal, AP window, prevascular and upper retroperitoneal adenopathyc/wmetastatic disease. 08/21/2019:  Head MRI revealed no evidence of metastatic disease. 08/29/2019:  PET scan revealed markedly hypermetabolic left lower lobe pulmonary lesion (SUV 7.9) Jordan/w the patient's known malignancy. There was associated hypermetabolic nodal metastases in the mediastinum and left hilum with metastatic involvement of left pleural space. Additional sites included upper abdominal hypermetabolic lymphadenopathy and scattered hypermetabolic bone  metastases (left acetabulum, sacrum, and L5). There was interval decrease in left pleural effusion with areas of loculation. 10/11/2019:  Chest, abdomen, and pelvis CT revealed considerable reduction in size of prior hypermetabolic lymph nodes in the chest and abdomen, compatible with response to therapy. There was notably reduced  size of the left pleural effusion although some complexity or loculation persists. The previous areas of hypermetabolic nodularity along the pleural surface were not obviously nodular along the pleural surface. There was considerable reduction in size of the previously consolidated/collapsed left lower lobe, although there was still some residual indistinct opacity in this region. The previous hypermetabolic osseous metastatic lesions were relatively occult.  There was mild airway thickening was present, suggesting bronchitis or reactive airways disease. There was trace pelvic ascites, improved from prior. There was chronic deformity of the left medial clavicle likely due to an old fracture with some deformity of the left sternoclavicular joint. 11/23/2019: Chest CT angiogram revealed no pulmonary embolus.  There was new patchy and ground-glass opacity in the dependent right lower lobe, perifissural right middle and right upper lobe. Findings were suspicious for pneumonia, including aspiration or atypical viral organisms such as COVID-19. There was a small left pleural effusion with slight increase size from prior exam. Volume loss in the left lower lobe with areas of ill-defined consolidation, was not significantly changed from prior. Right hilar adenopathy was new from prior exam, nonspecific in this setting. Recommend attention at follow-up. There was debris within the trachea and to a lesser extent right mainstem bronchus. There was right lower lobe bronchial thickening with areas of mucous plugging.  11/29/2019:  Abdomen and pelvis CT revealed residual patchy nodular foci of  consolidation at the right lung base, overall decreased since 11/22/2019 favoring resolving bronchopneumonia.  Follow-up chest CT was advised.  There was a stable small dependent left pleural effusion and stable top normal size previously hypermetabolic left periaortic lymph node.  There was stable faint sclerotic left pelvic bone lesions.  There were no new or progressive metastatic disease in the abdomen or pelvis.   Infusion on 12/30/2019  Component Date Value Ref Range Status  . Sodium 12/30/2019 130* 135 - 145 mmol/L Final  . Potassium 12/30/2019 3.7  3.5 - 5.1 mmol/L Final  . Chloride 12/30/2019 96* 98 - 111 mmol/L Final  . CO2 12/30/2019 24  22 - 32 mmol/L Final  . Glucose, Bld 12/30/2019 126* 70 - 99 mg/dL Final   Glucose reference range applies only to samples taken after fasting for at least 8 hours.  . BUN 12/30/2019 8  8 - 23 mg/dL Final  . Creatinine, Ser 12/30/2019 0.62  0.61 - 1.24 mg/dL Final  . Calcium 12/30/2019 8.5* 8.9 - 10.3 mg/dL Final  . Total Protein 12/30/2019 7.2  6.5 - 8.1 g/dL Final  . Albumin 12/30/2019 3.4* 3.5 - 5.0 g/dL Final  . AST 12/30/2019 34  15 - 41 U/L Final  . ALT 12/30/2019 49* 0 - 44 U/L Final  . Alkaline Phosphatase 12/30/2019 100  38 - 126 U/L Final  . Total Bilirubin 12/30/2019 1.0  0.3 - 1.2 mg/dL Final  . GFR calc non Af Amer 12/30/2019 >60  >60 mL/min Final  . GFR calc Af Amer 12/30/2019 >60  >60 mL/min Final  . Anion gap 12/30/2019 10  5 - 15 Final   Performed at [facility], [address], [place], [place] [address]  . WBC 12/30/2019 10.4  4.0 - 10.5 K/uL Final  . RBC 12/30/2019 3.64* 4.22 - 5.81 MIL/uL Final  . Hemoglobin 12/30/2019 12.5* 13.0 - 17.0 g/dL Final  . HCT 12/30/2019 35.9* 39 - 52 % Final  . MCV 12/30/2019 98.6  80.0 - 100.0 fL Final  . MCH 12/30/2019 34.3* 26.0 - 34.0 pg Final  .  MCHC 12/30/2019 34.8  30.0 - 36.0 g/dL Final  . RDW 12/30/2019 14.2  11.5 - 15.5 % Final  . Platelets 12/30/2019 168  150 - 400  K/uL Final  . nRBC 12/30/2019 0.0  0.0 - 0.2 % Final  . Neutrophils Relative % 12/30/2019 76  % Final  . Neutro Abs 12/30/2019 7.8* 1.7 - 7.7 K/uL Final  . Lymphocytes Relative 12/30/2019 12  % Final  . Lymphs Abs 12/30/2019 1.3  0.7 - 4.0 K/uL Final  . Monocytes Relative 12/30/2019 8  % Final  . Monocytes Absolute 12/30/2019 0.9  0 - 1 K/uL Final  . Eosinophils Relative 12/30/2019 3  % Final  . Eosinophils Absolute 12/30/2019 0.3  0 - 0 K/uL Final  . Basophils Relative 12/30/2019 0  % Final  . Basophils Absolute 12/30/2019 0.0  0 - 0 K/uL Final  . Immature Granulocytes 12/30/2019 1  % Final  . Abs Immature Granulocytes 12/30/2019 0.06  0.00 - 0.07 K/uL Final   Performed at [facility], [address] [place], [place] [address]    Assessment:  Scott Jordan is a 69 y.o. male with extensive stage small cell lung cancer. He has a >40 pack year smoking. He presented with a recurrent left sided pleural effusion. He has undergone thoracentesis x 2 in the past month. He is s/p pigtail drain. Initial cytology x 2 was negative (lymphocytic exudative effusion).Pleural fluid cytologyon 08/18/2019 confirmed small cell lung cancer.  Chest CT without contraston 08/19/2019 revealed the left pleural pigtail drain. There was a small residual left hydropneumothorax. There was a 6.7 x 4 cm medial basilar left lower lobe lung masssuspicious for primary bronchogenic carcinoma. There was a lingular 1.0 cm solid pulmonary nodulesuspicious for ipsilateral metastasis. There was intralobular septal thickening and patchy groundglass opacity throughout the lingula and the left lower lobe with moderate residual atelectasis of the left lung base. A component of lymphangitic carcinomatosis was suspected. There was ipsilateral infra hilar, subcarinal, AP window, prevascular and upper retroperitoneal adenopathyc/wmetastatic disease.  Head MRI on 08/21/2019 revealed no evidence of metastatic  disease.  PET scan on 05/20/20201 revealed markedly hypermetabolic left lower lobe pulmonary lesion (SUV 7.9) Jordan/w the patient's known malignancy. There was associated hypermetabolic nodal metastases in the mediastinum and left hilum with metastatic involvement of left pleural space. Additional sites included upper abdominal hypermetabolic lymphadenopathy and scattered hypermetabolic bone metastases (left acetabulum, sacrum, and L5). There was interval decrease in left pleural effusion with areas of loculation.  CEA was 3.7 on 09/02/2019.  LDH was 206 on 09/23/2019.  He is s/p 4 cycles of carboplatin, etoposide, and Tecentriq (09/02/2019- 11/11/2019) with Udencya support  Chest CT angiogram on 11/23/2019 revealed no pulmonary embolus.  There was new patchy and ground-glass opacity in the dependent right lower lobe, perifissural right middle and right upper lobe. Findings were suspicious for pneumonia, including aspiration or atypical viral organisms such as COVID-19. There was a small left pleural effusion with slight increase size from prior exam. Volume loss in the left lower lobe with areas of ill-defined consolidation, was not significantly changed from prior. Right hilar adenopathy was new from prior exam, nonspecific in this setting. Recommend attention at follow-up. There was debris within the trachea and to a lesser extent right mainstem bronchus. There was right lower lobe bronchial thickening with areas of mucous plugging.   Abdomen and pelvis CT on 11/29/2019 revealed residual patchy nodular foci of consolidation at the right lung base, overall decreased since 11/22/2019  favoring resolving bronchopneumonia.  Follow-up chest CT was advised.  There was a stable small dependent left pleural effusion and stable top normal size previously hypermetabolic left periaortic lymph node.  There was stable faint sclerotic left pelvic bone lesions.  There were no new or progressive metastatic disease in the  abdomen or pelvis.  He was admitted to [facility] from 11/22/2019 - 11/25/2019 with a heroin overdose and aspiration pneumonia.  He received 1 unit of PRBCs.  He was treated with Zosyn.  He was discharged on Augmentin x5 days.  He has hyponatremiasecondary to SIADH.  He began sodium chloride tablets (1 gm TID) on 09/04/2019.  He has elevated LFTs. Hepatitis Jordan antibody and hepatitis B core antibody total were positive on 12/23/2019.  Hepatitis B surface antigen, hepatitis B core IgM and hepatitis A IgM were negative.  He has a significant family history of malignancy. A family member has had genetic testing (negative for Lynch syndrome).  Symptomatically, he feels fine.  He denies any alcohol use for 2 weeks.  He denies any drug use.  Blood pressure is elevated today as he ran out of his blood pressure medications.  Exam is stable.  Plan: 1.   Labs today:  CBC with diff, CMP, hepatitis Jordan RNA quantitative reflex genotype, and hepatitis B DNA quantitative. 2. Metastatic small cell lung cancer Clinical stage T3N2M1 (stage IV). He is currently s/p 4 cycles of carboplatin, etoposide and Tecentriq (last 11/11/2019).             Chest, abdomen, and pelvis CT on 10/11/2019 revealed a dramatic response to treatment.             Chest CT angiogram plus abdomen and pelvis CT scan revealed a dramatic response to treatment.             He is s/p cycle #1 maintenance Tecentriq (12/02/2019).             Labs reviewed.  Continue to postpone cycle #2 maintenance Tecentriq until increased LFTs have resolved with explanation.                         Suspect etiology is secondary to possible infectious hepatitis rather than Tecentriq induced hepatitis.             Patient has appointment with Dr. Huey Bienenstock tomorrow for consideration of PCI. 3. Hyponatremia Sodium 130.             Increase salt tablets to 1 g twice daily.             Etiology secondary to SIADH from small cell  lung cancer.             Encourage continued free water restriction and fluids with electrolytes. 4. History of atrial fibrillation He remains on Eliquis. Patient denies any bleeding. 5.   Chemotherapy induced anemia             Hematocrit 33.9.  Hemoglobin 11.7.  MCV 100.9 on 12/23/2019.                         Ferritin 910 with an iron saturation 65% and a TIBC 280 on 09/16/2019.                         B12 was 673 and folate 8.6 on 09/16/2019.   Hematocrit 35.9.  Hemoglobin 12.5.  MCV 98.6 on 12/30/2019.  No intervention needed as counts continue to improve. 6.   Elevated LFTs             AST   20.  ALT   12.  Bilirubin 0.6.  Alkaline phosphatase 70 on 12/02/2019.             AST 134.  ALT 132.  Bilirubin 0.3.  Alkaline phosphatase 85 on 12/23/2019.  AST   34.  ALT   49.  Bilirubin 1.0.  Alkaline phosphatase 100 on 12/30/2019.             Etiology secondary to alcohol vs viral hepatitis vs immune mediated hepatitis.             He has a history of untreated hepatitis Jordan.  Follow-up hepatitis Jordan RNA and hepatitis B DNA quantitative.  Discuss reconsultation with Dr. Allen Norris. 7.     Hypertension   Patient ran out of blood pressure medications.  Patient has no PCP.  Patient to Urgent Care re: blood pressure. 8.   Hold Tecentriq today. 9.   Patient has an appointment with Dr Scott Jordan tomorrow. 10.   Consult Dr Allen Norris re: hepatitis B and Jordan+. 11.   RTC in 1 week for Scott Jordan assessment, labs (CBC with diff, CMP), and cycle #2 maintenance Tecentriq.  Addendum: Hepatitis B DNA by PCR was unable to be detected.  Hepatitis Jordan RNA quantitative revealed 3.869 log 10 IU/mL (7400 IU/mL).  Hepatitis Jordan genotype is 2b.  I discussed the assessment and treatment plan with the patient.  The patient was provided an opportunity to ask questions and all were answered.  The patient agreed with the plan and demonstrated an understanding of the instructions.  The patient was advised to  call back if the symptoms worsen or if the condition fails to improve as anticipated.  I provided 16 minutes (9:38 AM - 9:54 AM) of face-to-face time during this this encounter and > 50% was spent counseling as documented under my assessment and plan.     Scott Asal, MD, PhD    12/30/2019, 9:56 AM   I, Jacqualyn Posey, am acting as a Education administrator for [employer]. Mike Gip, Scott Jordan.   I, Scott Jordan. Mike Gip, Scott Jordan, have reviewed the above documentation for accuracy and completeness, and I agree with the above.

## 2019-12-30 ENCOUNTER — Ambulatory Visit
Admission: EM | Admit: 2019-12-30 | Discharge: 2019-12-30 | Disposition: A | Discharge status: Home / Self Care | Payer: Medicare Other | Attending: Physician Assistant | Admitting: Physician Assistant

## 2019-12-30 ENCOUNTER — Inpatient Hospital Stay (HOSPITAL_BASED_OUTPATIENT_CLINIC_OR_DEPARTMENT_OTHER): Payer: Medicare Other | Admitting: Hematology and Oncology

## 2019-12-30 ENCOUNTER — Inpatient Hospital Stay: Payer: Medicare Other

## 2019-12-30 ENCOUNTER — Other Ambulatory Visit: Payer: Self-pay | Admitting: Hematology and Oncology

## 2019-12-30 ENCOUNTER — Other Ambulatory Visit: Payer: Self-pay

## 2019-12-30 ENCOUNTER — Encounter: Payer: Self-pay | Admitting: Hematology and Oncology

## 2019-12-30 ENCOUNTER — Encounter: Payer: Self-pay | Admitting: Radiation Oncology

## 2019-12-30 VITALS — BP 153/108 | HR 117 | Temp 97.9°F | Wt 133.2 lb

## 2019-12-30 DIAGNOSIS — Z7189 Other specified counseling: Secondary | ICD-10-CM

## 2019-12-30 DIAGNOSIS — E871 Hypo-osmolality and hyponatremia: Secondary | ICD-10-CM

## 2019-12-30 DIAGNOSIS — R7989 Other specified abnormal findings of blood chemistry: Secondary | ICD-10-CM

## 2019-12-30 DIAGNOSIS — C3490 Malignant neoplasm of unspecified part of unspecified bronchus or lung: Secondary | ICD-10-CM

## 2019-12-30 DIAGNOSIS — Z87891 Personal history of nicotine dependence: Secondary | ICD-10-CM | POA: Diagnosis not present

## 2019-12-30 DIAGNOSIS — I10 Essential (primary) hypertension: Secondary | ICD-10-CM | POA: Diagnosis not present

## 2019-12-30 DIAGNOSIS — B1910 Unspecified viral hepatitis B without hepatic coma: Secondary | ICD-10-CM

## 2019-12-30 DIAGNOSIS — D6481 Anemia due to antineoplastic chemotherapy: Secondary | ICD-10-CM | POA: Diagnosis not present

## 2019-12-30 DIAGNOSIS — Z5112 Encounter for antineoplastic immunotherapy: Secondary | ICD-10-CM | POA: Diagnosis not present

## 2019-12-30 DIAGNOSIS — T451X5A Adverse effect of antineoplastic and immunosuppressive drugs, initial encounter: Secondary | ICD-10-CM | POA: Diagnosis not present

## 2019-12-30 DIAGNOSIS — B182 Chronic viral hepatitis C: Secondary | ICD-10-CM

## 2019-12-30 DIAGNOSIS — I4891 Unspecified atrial fibrillation: Secondary | ICD-10-CM | POA: Diagnosis not present

## 2019-12-30 DIAGNOSIS — Z95828 Presence of other vascular implants and grafts: Secondary | ICD-10-CM

## 2019-12-30 DIAGNOSIS — Z79899 Other long term (current) drug therapy: Secondary | ICD-10-CM | POA: Diagnosis not present

## 2019-12-30 DIAGNOSIS — Z7901 Long term (current) use of anticoagulants: Secondary | ICD-10-CM | POA: Diagnosis not present

## 2019-12-30 DIAGNOSIS — E222 Syndrome of inappropriate secretion of antidiuretic hormone: Secondary | ICD-10-CM | POA: Diagnosis not present

## 2019-12-30 LAB — CBC WITH DIFFERENTIAL/PLATELET
Abs Immature Granulocytes: 0.06 10*3/uL (ref 0.00–0.07)
Basophils Absolute: 0 10*3/uL (ref 0.0–0.1)
Basophils Relative: 0 %
Eosinophils Absolute: 0.3 10*3/uL (ref 0.0–0.5)
Eosinophils Relative: 3 %
HCT: 35.9 % — ABNORMAL LOW (ref 39.0–52.0)
Hemoglobin: 12.5 g/dL — ABNORMAL LOW (ref 13.0–17.0)
Immature Granulocytes: 1 %
Lymphocytes Relative: 12 %
Lymphs Abs: 1.3 10*3/uL (ref 0.7–4.0)
MCH: 34.3 pg — ABNORMAL HIGH (ref 26.0–34.0)
MCHC: 34.8 g/dL (ref 30.0–36.0)
MCV: 98.6 fL (ref 80.0–100.0)
Monocytes Absolute: 0.9 10*3/uL (ref 0.1–1.0)
Monocytes Relative: 8 %
Neutro Abs: 7.8 10*3/uL — ABNORMAL HIGH (ref 1.7–7.7)
Neutrophils Relative %: 76 %
Platelets: 168 10*3/uL (ref 150–400)
RBC: 3.64 MIL/uL — ABNORMAL LOW (ref 4.22–5.81)
RDW: 14.2 % (ref 11.5–15.5)
WBC: 10.4 10*3/uL (ref 4.0–10.5)
nRBC: 0 % (ref 0.0–0.2)

## 2019-12-30 LAB — COMPREHENSIVE METABOLIC PANEL
ALT: 49 U/L — ABNORMAL HIGH (ref 0–44)
AST: 34 U/L (ref 15–41)
Albumin: 3.4 g/dL — ABNORMAL LOW (ref 3.5–5.0)
Alkaline Phosphatase: 100 U/L (ref 38–126)
Anion gap: 10 (ref 5–15)
BUN: 8 mg/dL (ref 8–23)
CO2: 24 mmol/L (ref 22–32)
Calcium: 8.5 mg/dL — ABNORMAL LOW (ref 8.9–10.3)
Chloride: 96 mmol/L — ABNORMAL LOW (ref 98–111)
Creatinine, Ser: 0.62 mg/dL (ref 0.61–1.24)
GFR calc Af Amer: >60 mL/min (ref >60)
GFR calc non Af Amer: >60 mL/min (ref >60)
Glucose, Bld: 126 mg/dL — ABNORMAL HIGH (ref 70–99)
Potassium: 3.7 mmol/L (ref 3.5–5.1)
Sodium: 130 mmol/L — ABNORMAL LOW (ref 135–145)
Total Bilirubin: 1 mg/dL (ref 0.3–1.2)
Total Protein: 7.2 g/dL (ref 6.5–8.1)

## 2019-12-30 MED ORDER — SODIUM CHLORIDE 0.9% FLUSH
10.0000 mL | INTRAVENOUS | Status: DC | PRN
  Administered 2019-12-30: 10 mL via INTRAVENOUS
  Filled 2019-12-30: qty 10

## 2019-12-30 MED ORDER — HEPARIN SOD (PORK) LOCK FLUSH 100 UNIT/ML IV SOLN
500.0000 [IU] | Freq: Once | INTRAVENOUS | Status: AC
  Administered 2019-12-30: 500 [IU] via INTRAVENOUS
  Filled 2019-12-30: qty 5

## 2019-12-30 MED ORDER — METOPROLOL SUCCINATE ER 50 MG PO TB24: 50.0000 mg | ORAL_TABLET | Freq: Every day | ORAL | 0 refills | Status: DC

## 2019-12-30 MED ORDER — DILTIAZEM HCL ER COATED BEADS 240 MG PO CP24: 240.0000 mg | ORAL_CAPSULE | Freq: Every day | ORAL | 0 refills | Status: DC

## 2019-12-30 NOTE — ED Triage Notes (Signed)
Patient states that he was sent over here from [facility] due to his hypertension. States that he is out of his BP medication. Patient unsure of which medication it is without having the bottle. Patient reports that he was supposed to have a treatment today but they couldn't do it due to BP. Patient reports that he does not have a PCP. Bp at [facility] was 153/108.

## 2019-12-30 NOTE — ED Provider Notes (Signed)
[facility]    CSN: [id number] Arrival date & time: 12/30/19  1035      History   Chief Complaint Chief Complaint  Patient presents with  . Hypertension    HPI Scott Jordan is a 69 y.o. male.   Patient presents with elevated blood pressure.  He says he went to the cancer center today and was sent here because his blood pressure was too high.  He states his blood pressure was 153/108 at that time.  Patient says he is on a 1 pressure medications, but is not sure which one it is.  Patient goes to the cancer center for chemotherapy for small cell lung cancer with bone metastasis.  Patient does have a history of atrial fibrillation and hypertension.  He also has a history of hepatitis C.  Patient says he feels fine.  He denies any chest pain or breathing difficulty.  He denies weakness or headaches or dizziness.  He has no concerns today.  HPI  Past Medical History:  Diagnosis Date  . A-fib ([place]) 01/10/2019   pt st this was dx by Dr. Ubaldo Glassing  . Cancer ([place]) 2021   LUNG  . Complication of anesthesia    DIFFICULTY WAKING UP AFTER SURGERY- 20 YRS AGO  . Hypertension   . Lung cancer ([place])   . Substance abuse [facility])     Patient Active Problem List   Diagnosis Date Noted  . Hepatitis B infection without delta agent without hepatic coma 12/30/2019  . Chronic hepatitis C without hepatic coma ([place]) 12/30/2019  . Chronic anticoagulation 12/23/2019  . Elevated LFTs 12/23/2019  . Hypokalemia 12/08/2019  . Acute respiratory failure with hypoxia ([place]) 11/23/2019  . Heroin overdose, accidental or unintentional, initial encounter ([place]) 11/23/2019  . Aspiration pneumonia ([place]) 11/23/2019  . Pancytopenia ([place]) 11/23/2019  . Atypical atrial flutter ([place]) 11/23/2019  . Hypomagnesemia 10/21/2019  . Antineoplastic chemotherapy induced anemia 10/13/2019  . Dehydration 10/13/2019  . Thrombocytopenia ([place]) 10/13/2019  . Chemotherapy-induced neutropenia ([place]) 10/10/2019  . Anemia  09/11/2019  . Cancer-related pain 09/05/2019  . Encounter for antineoplastic chemotherapy 09/02/2019  . Encounter for antineoplastic immunotherapy 09/02/2019  . Bone metastasis ([place]) 09/02/2019  . Goals of care, counseling/discussion 08/26/2019  . Small cell lung cancer ([place]) 08/23/2019  . Protein-calorie malnutrition, severe 08/19/2019  . Hyponatremia   . Dyspnea   . Pleural effusion   . Recurrent pleural effusion on left   . Malignant pleural effusion 08/17/2019  . Alcohol abuse 07/21/2019  . History of substance abuse ([place]) 06/18/2019  . Hypertension, essential 06/18/2019  . Malnutrition of moderate degree 01/15/2019  . Elevated troponin 01/14/2019    Past Surgical History:  Procedure Laterality Date  . ARM DEBRIDEMENT Left    INCISION AND DEBRIDEMENT LOWER ARM -20 YRS AGO  . BACK SURGERY    . PORTACATH PLACEMENT Left 08/29/2019   Procedure: INSERTION PORT-A-CATH;  Surgeon: Nestor Lewandowsky, MD;  Location: [facility] ORS;  Service: General;  Laterality: Left;       Home Medications    Prior to Admission medications   Medication Sig Start Date End Date Taking? Authorizing Provider  calcium carbonate (CALCIUM 600) 600 MG TABS tablet Take 600 mg by mouth daily with breakfast.   Yes [provider]  ELIQUIS 5 MG TABS tablet Take 5 mg by mouth 2 (two) times daily. 08/30/19  Yes [provider]  escitalopram (LEXAPRO) 10 MG tablet Take 1 tablet (10 mg total) by mouth daily. 08/30/19  Yes  Jacquelin Hawking, NP  feeding supplement, ENSURE ENLIVE, (ENSURE ENLIVE) LIQD Take 237 mLs by mouth 3 (three) times daily between meals. 08/23/19  Yes Enzo Bi, MD  furosemide (LASIX) 20 MG tablet Take 1 tablet (20 mg total) by mouth daily. 09/04/19  Yes Corcoran, Drue Second, MD  HYDROcodone-acetaminophen (NORCO/VICODIN) 5-325 MG tablet Take 1 tablet by mouth every 6 (six) hours as needed for moderate pain. 09/02/19  Yes Lequita Asal, MD  lidocaine-prilocaine (EMLA) cream Apply to  affected area once 08/26/19  Yes Corcoran, Drue Second, MD  Magnesium 500 MG CAPS Take 500 mg by mouth daily.   Yes [provider]  nystatin (MYCOSTATIN) 100000 UNIT/ML suspension Use as directed 5 mLs (500,000 Units total) in the mouth or throat 4 (four) times daily. Swish and spit 09/02/19  Yes Corcoran, Drue Second, MD  ondansetron (ZOFRAN) 8 MG tablet Take 1 tablet (8 mg total) by mouth every 8 (eight) hours as needed for refractory nausea / vomiting. Start on day 3 after carboplatin chemo. 08/26/19  Yes Corcoran, Drue Second, MD  potassium chloride SA (KLOR-CON) 20 MEQ tablet TAKE 1 TABLET BY MOUTH DAILY FOR 3 DAYS Patient taking differently: Take 20 mEq by mouth daily.  12/03/19  Yes Corcoran, Drue Second, MD  sodium chloride 1 g tablet Take 1 tablet (1 g total) by mouth 3 (three) times daily with meals. 09/23/19  Yes Corcoran, Drue Second, MD  diltiazem (CARDIZEM CD) 240 MG 24 hr capsule Take 1 capsule (240 mg total) by mouth daily. 12/30/19 03/29/20  Laurene Footman B, PA-C  metoprolol succinate (TOPROL-XL) 50 MG 24 hr tablet Take 1 tablet (50 mg total) by mouth daily. Take with or immediately following a meal. 12/30/19 03/29/20  Danton Clap, PA-C    Family History History reviewed. No pertinent family history.  Social History Social History   Tobacco Use  . Smoking status: Former Smoker    Packs/day: 0.50    Types: Cigarettes  . Smokeless tobacco: Never Used  . Tobacco comment: not smoked since 6 weeks ago  Vaping Use  . Vaping Use: Never used  Substance Use Topics  . Alcohol use: Yes    Comment: 84 OZ IN WEEK  . Drug use: Not Currently    Types: Heroin    Comment: heroin WITHIN PAST YEAR     Allergies   Patient has no known allergies.   Review of Systems Review of Systems  Constitutional: Negative for fatigue and fever.  Respiratory: Negative for chest tightness, shortness of breath and wheezing.   Cardiovascular: Negative for chest pain and palpitations.   Gastrointestinal: Negative for abdominal pain, nausea and vomiting.  Neurological: Negative for dizziness, weakness, light-headedness, numbness and headaches.     Physical Exam Triage Vital Signs ED Triage Vitals  Enc Vitals Group     BP 12/30/19 1101 (!) 145/105     Pulse Rate 12/30/19 1101 97     Resp 12/30/19 1101 18     Temp 12/30/19 1101 98.5 F (36.9 C)     Temp Source 12/30/19 1101 Oral     SpO2 12/30/19 1101 99 %     Weight 12/30/19 1058 133 lb 2.5 oz (60.4 kg)     Height 12/30/19 1058 6\' 2"  (1.88 m)     Head Circumference --      Peak Flow --      Pain Score 12/30/19 1058 0     Pain Loc --      Pain Edu? --  Excl. in GC? --    No data found.  Updated Vital Signs BP (!) 145/105 (BP Location: Right Arm)   Pulse 97 Comment: irregular  Temp 98.5 F (36.9 C) (Oral)   Resp 18   Ht 6\' 2"  (1.88 m)   Wt 133 lb 2.5 oz (60.4 kg)   SpO2 99%   BMI 17.10 kg/m       Physical Exam Vitals and nursing note reviewed.  Constitutional:      General: He is not in acute distress.    Appearance: Normal appearance. He is well-developed and normal weight. He is not ill-appearing or toxic-appearing.  HENT:     Head: Normocephalic and atraumatic.  Eyes:     General: No scleral icterus.    Conjunctiva/sclera: Conjunctivae normal.  Cardiovascular:     Rate and Rhythm: Normal rate. Rhythm irregular.     Heart sounds: Normal heart sounds. No murmur heard.   Pulmonary:     Effort: Pulmonary effort is normal. No respiratory distress.     Breath sounds: Normal breath sounds.  Abdominal:     Palpations: Abdomen is soft.     Tenderness: There is no abdominal tenderness.  Musculoskeletal:     Cervical back: Neck supple.  Skin:    General: Skin is warm and dry.  Neurological:     General: No focal deficit present.     Mental Status: He is alert. Mental status is at baseline.     Motor: No weakness.     Gait: Gait normal.  Psychiatric:        Mood and Affect: Mood  normal.        Behavior: Behavior normal.        Thought Content: Thought content normal.      UC Treatments / Results  Labs (all labs ordered are listed, but only abnormal results are displayed) Labs Reviewed - No data to display  EKG   Radiology No results found.  Procedures Procedures (including critical care time)  Medications Ordered in UC Medications - No data to display  Initial Impression / Assessment and Plan / UC Course  I have reviewed the triage vital signs and the nursing notes.  Pertinent labs & imaging results that were available during my care of the patient were reviewed by me and considered in my medical decision making (see chart for details).   Patient is not ill-appearing.  His blood pressure is elevated at 145/105.  His chest is clear to auscultation.  His heart rate is intermittently irregular.  I have refilled the diltiazem and metoprolol for patient and advised him to continue following up with cancer center.  Final Clinical Impressions(s) / UC Diagnoses   Final diagnoses:  Essential hypertension   Discharge Instructions   None    ED Prescriptions    Medication Sig Dispense Auth. Provider   diltiazem (CARDIZEM CD) 240 MG 24 hr capsule Take 1 capsule (240 mg total) by mouth daily. 90 capsule Laurene Footman B, PA-C   metoprolol succinate (TOPROL-XL) 50 MG 24 hr tablet Take 1 tablet (50 mg total) by mouth daily. Take with or immediately following a meal. 90 tablet Danton Clap, [place]     PDMP not reviewed this encounter.   Danton Clap, PA-C 12/30/19 1132

## 2019-12-30 NOTE — Progress Notes (Signed)
No new changes noted today 

## 2019-12-31 ENCOUNTER — Ambulatory Visit
Admission: RE | Admit: 2019-12-31 | Discharge: 2019-12-31 | Disposition: A | Discharge status: Home / Self Care | Payer: Medicare Other | Source: Ambulatory Visit | Attending: Radiation Oncology | Admitting: Radiation Oncology

## 2019-12-31 DIAGNOSIS — Z7901 Long term (current) use of anticoagulants: Secondary | ICD-10-CM | POA: Diagnosis not present

## 2019-12-31 DIAGNOSIS — C3432 Malignant neoplasm of lower lobe, left bronchus or lung: Secondary | ICD-10-CM | POA: Insufficient documentation

## 2019-12-31 DIAGNOSIS — J90 Pleural effusion, not elsewhere classified: Secondary | ICD-10-CM | POA: Insufficient documentation

## 2019-12-31 DIAGNOSIS — Z79899 Other long term (current) drug therapy: Secondary | ICD-10-CM | POA: Insufficient documentation

## 2019-12-31 DIAGNOSIS — I4891 Unspecified atrial fibrillation: Secondary | ICD-10-CM | POA: Diagnosis not present

## 2019-12-31 DIAGNOSIS — I10 Essential (primary) hypertension: Secondary | ICD-10-CM | POA: Insufficient documentation

## 2019-12-31 DIAGNOSIS — F1110 Opioid abuse, uncomplicated: Secondary | ICD-10-CM | POA: Diagnosis not present

## 2019-12-31 DIAGNOSIS — Z87891 Personal history of nicotine dependence: Secondary | ICD-10-CM | POA: Insufficient documentation

## 2019-12-31 DIAGNOSIS — C3490 Malignant neoplasm of unspecified part of unspecified bronchus or lung: Secondary | ICD-10-CM

## 2019-12-31 LAB — HEPATITIS B DNA, ULTRAQUANTITATIVE, PCR
HBV DNA SERPL PCR-ACNC: NOT DETECTED IU/mL
HBV DNA SERPL PCR-LOG IU: UNDETERMINED log10 IU/mL

## 2019-12-31 NOTE — Consult Note (Signed)
NEW PATIENT EVALUATION  Name: Scott Jordan  MRN: [id number]  Date:   12/31/2019     DOB: December 15, 1950   This 69 y.o. male patient presents to the clinic for initial evaluation of patient with known extensive stage small cell lung cancer for evaluation of PCI.  REFERRING PHYSICIAN: Lequita Asal, MD  CHIEF COMPLAINT:  Chief Complaint  Patient presents with  . Lung Cancer    INitial consultation    DIAGNOSIS: The encounter diagnosis was Small cell lung cancer ([place]).   PREVIOUS INVESTIGATIONS:  PET scan CT scans reviewed Pathology report reviewed Clinical notes reviewed  HPI: Patient is a 69 year old male with extensive smoking history presented with weight loss of about 15 to 20 pounds over the past 6 months.  He presented to the hospital [facility] with atrial flutter and pneumonia.  Thoracentesis revealed a lymphocytic predominant exudate.  Cytology was negative in May 2021 he was admitted to [facility] with again postobstructive pneumonia hyponatremia.  CT scan showed a 6.3 x 3.4 cm left lower lobe mass worrisome for malignancy.  There was significant left lower lobe consolidation and a large left pleural effusion.  He had also necrotic periaortic and aortocaval lymph nodes.  He received a chest tube and pleural cytology was positive for small cell carcinoma.  There was ipsilateral infrahilar subcarinal AP window prevascular upper retroperitoneal adenopathy compatible with metastatic disease.  Patient has received carboplatin etoposide and concentric.  His most recent CT scan back in August showed probable pneumonia in the right lower lobe.  He was admitted to [facility] on August 13 with a heroin overdose and aspiration pneumonia.  He is seen today for Consideration of PCI.  He is fairly asymptomatic at this time does have some shortness of breath and dyspnea on exertion.  He is status post 1 cycle of maintenance Tecentriq.  He specifically denies any change in neurologic status no focal  neurologic deficits or headaches or change in visual fields.  PLANNED TREATMENT REGIMEN: Whole brain radiation  PAST MEDICAL HISTORY:  has a past medical history of A-fib ([place]) (01/10/2019), Cancer ([place]) (6269), Complication of anesthesia, Hypertension, Lung cancer ([place]), and Substance abuse ([place]).    PAST SURGICAL HISTORY:  Past Surgical History:  Procedure Laterality Date  . ARM DEBRIDEMENT Left    INCISION AND DEBRIDEMENT LOWER ARM -20 YRS AGO  . BACK SURGERY    . PORTACATH PLACEMENT Left 08/29/2019   Procedure: INSERTION PORT-A-CATH;  Surgeon: Nestor Lewandowsky, MD;  Location: [facility] ORS;  Service: General;  Laterality: Left;    FAMILY HISTORY: family history is not on file.  SOCIAL HISTORY:  reports that he has quit smoking. His smoking use included cigarettes. He smoked 0.50 packs per day. He has never used smokeless tobacco. He reports current alcohol use. He reports previous drug use. Drug: Heroin.  ALLERGIES: Patient has no known allergies.  MEDICATIONS:  Current Outpatient Medications  Medication Sig Dispense Refill  . calcium carbonate (CALCIUM 600) 600 MG TABS tablet Take 600 mg by mouth daily with breakfast.    . ELIQUIS 5 MG TABS tablet Take 5 mg by mouth 2 (two) times daily.    Mar[place] Kitchen escitalopram (LEXAPRO) 10 MG tablet Take 1 tablet (10 mg total) by mouth daily. 90 tablet 0  . feeding supplement, ENSURE ENLIVE, (ENSURE ENLIVE) LIQD Take 237 mLs by mouth 3 (three) times daily between meals. 237 mL 12  . furosemide (LASIX) 20 MG tablet Take 1 tablet (20 mg total) by mouth daily. [place]  tablet 1  . HYDROcodone-acetaminophen (NORCO/VICODIN) 5-325 MG tablet Take 1 tablet by mouth every 6 (six) hours as needed for moderate pain. 30 tablet 0  . lidocaine-prilocaine (EMLA) cream Apply to affected area once 30 g 3  . Magnesium 500 MG CAPS Take 500 mg by mouth daily.    Mar[place] Kitchen nystatin (MYCOSTATIN) 100000 UNIT/ML suspension Use as directed 5 mLs (500,000 Units total) in the mouth or throat 4 (four)  times daily. Swish and spit 60 mL 0  . ondansetron (ZOFRAN) 8 MG tablet Take 1 tablet (8 mg total) by mouth every 8 (eight) hours as needed for refractory nausea / vomiting. Start on day 3 after carboplatin chemo. 30 tablet 1  . potassium chloride SA (KLOR-CON) 20 MEQ tablet TAKE 1 TABLET BY MOUTH DAILY FOR 3 DAYS (Patient taking differently: Take 20 mEq by mouth daily. ) 3 tablet 0  . sodium chloride 1 g tablet Take 1 tablet (1 g total) by mouth 3 (three) times daily with meals. 90 tablet 1  . diltiazem (CARDIZEM CD) 240 MG 24 hr capsule Take 1 capsule (240 mg total) by mouth daily. 90 capsule 0  . metoprolol succinate (TOPROL-XL) 50 MG 24 hr tablet Take 1 tablet (50 mg total) by mouth daily. Take with or immediately following a meal. 90 tablet 0   No current facility-administered medications for this encounter.   Facility-Administered Medications Ordered in Other Encounters  Medication Dose Route Frequency Provider Last Rate Last Admin  . heparin lock flush 100 unit/mL  500 Units Intravenous Once Corcoran, Melissa C, MD      . sodium chloride flush (NS) 0.9 % injection 10 mL  10 mL Intravenous PRN Nolon Stalls C, MD   10 mL at 11/11/19 0812    ECOG PERFORMANCE STATUS:  0 - Asymptomatic  REVIEW OF SYSTEMS: Patient denies any weight loss, fatigue, weakness, fever, chills or night sweats. Patient denies any loss of vision, blurred vision. Patient denies any ringing  of the ears or hearing loss. No irregular heartbeat. Patient denies heart murmur or history of fainting. Patient denies any chest pain or pain radiating to her upper extremities. Patient denies any shortness of breath, difficulty breathing at night, cough or hemoptysis. Patient denies any swelling in the lower legs. Patient denies any nausea vomiting, vomiting of blood, or coffee ground material in the vomitus. Patient denies any stomach pain. Patient states has had normal bowel movements no significant constipation or diarrhea.  Patient denies any dysuria, hematuria or significant nocturia. Patient denies any problems walking, swelling in the joints or loss of balance. Patient denies any skin changes, loss of hair or loss of weight. Patient denies any excessive worrying or anxiety or significant depression. Patient denies any problems with insomnia. Patient denies excessive thirst, polyuria, polydipsia. Patient denies any swollen glands, patient denies easy bruising or easy bleeding. Patient denies any recent infections, allergies or URI. Patient "s visual fields have not changed significantly in recent time.   PHYSICAL EXAM: BP (!) (P) 132/99 (BP Location: Left Arm, Patient Position: Sitting)   Pulse (!) (P) 138   Temp (P) 97.7 F (36.5 C) (Tympanic)   Resp (P) 16   Wt (P) 133 lb 1.6 oz (60.4 kg)   BMI (P) 17.09 kg/m  Well-developed well-nourished patient in NAD. HEENT reveals PERLA, EOMI, discs not visualized.  Oral cavity is clear. No oral mucosal lesions are identified. Neck is clear without evidence of cervical or supraclavicular adenopathy. Lungs are clear to A&P. Cardiac examination  is essentially unremarkable with regular rate and rhythm without murmur rub or thrill. Abdomen is benign with no organomegaly or masses noted. Motor sensory and DTR levels are equal and symmetric in the upper and lower extremities. Cranial nerves II through XII are grossly intact. Proprioception is intact. No peripheral adenopathy or edema is identified. No motor or sensory levels are noted. Crude visual fields are within normal range.  LABORATORY DATA: Pathology report reviewed    RADIOLOGY RESULTS: Serial CT scans PET CT scans reviewed compatible with above-stated findings MRI of brain reviewed showing no evidence of intracranial disease.   IMPRESSION: Extensive stage small cell lung cancer status post chemotherapy currently scheduled for maintenance Tecentriq now for PCI  PLAN: At this time of offered PCI 2500 cGy in 10 fractions  to his whole brain.  Risks and benefits of treatment quitting hair loss fatigue skin reaction alteration of blood counts all were described in detail to the patient I have personally set up and ordered CT simulation.  Patient comprehends my recommendations well.  I would like to take this opportunity to thank you for allowing me to participate in the care of your patient.Noreene Filbert, MD

## 2020-01-01 LAB — HEPATITIS C GENOTYPE

## 2020-01-01 LAB — HCV RNA QUANT RFLX ULTRA OR GENOTYP
HCV RNA Qnt(log copy/mL): 3.869 log10 IU/mL
HepC Qn: 7400 IU/mL

## 2020-01-02 ENCOUNTER — Other Ambulatory Visit: Payer: Self-pay | Admitting: *Deleted

## 2020-01-02 ENCOUNTER — Ambulatory Visit
Admission: RE | Admit: 2020-01-02 | Discharge: 2020-01-02 | Disposition: A | Discharge status: Home / Self Care | Payer: Medicare Other | Source: Ambulatory Visit | Attending: Radiation Oncology | Admitting: Radiation Oncology

## 2020-01-02 DIAGNOSIS — Z298 Encounter for other specified prophylactic measures: Secondary | ICD-10-CM | POA: Insufficient documentation

## 2020-01-02 DIAGNOSIS — C3432 Malignant neoplasm of lower lobe, left bronchus or lung: Secondary | ICD-10-CM | POA: Insufficient documentation

## 2020-01-02 DIAGNOSIS — Z87891 Personal history of nicotine dependence: Secondary | ICD-10-CM | POA: Insufficient documentation

## 2020-01-02 DIAGNOSIS — Z510 Encounter for antineoplastic radiation therapy: Secondary | ICD-10-CM | POA: Diagnosis not present

## 2020-01-02 MED ORDER — DEXAMETHASONE 4 MG PO TABS: 4.0000 mg | ORAL_TABLET | Freq: Every day | ORAL | 0 refills | Status: DC

## 2020-01-03 ENCOUNTER — Other Ambulatory Visit: Payer: Self-pay | Admitting: *Deleted

## 2020-01-03 DIAGNOSIS — Z298 Encounter for other specified prophylactic measures: Secondary | ICD-10-CM | POA: Diagnosis not present

## 2020-01-03 DIAGNOSIS — C3490 Malignant neoplasm of unspecified part of unspecified bronchus or lung: Secondary | ICD-10-CM

## 2020-01-03 DIAGNOSIS — C3432 Malignant neoplasm of lower lobe, left bronchus or lung: Secondary | ICD-10-CM | POA: Diagnosis not present

## 2020-01-03 DIAGNOSIS — Z510 Encounter for antineoplastic radiation therapy: Secondary | ICD-10-CM | POA: Diagnosis not present

## 2020-01-03 DIAGNOSIS — Z87891 Personal history of nicotine dependence: Secondary | ICD-10-CM | POA: Diagnosis not present

## 2020-01-04 NOTE — Progress Notes (Signed)
[facility]  [address] [place], [place] [address] Phone: [phone]  Fax: [phone]   Clinic Day:  01/06/2020  Referring physician: No ref. provider found  Chief Complaint: Scott Jordan is a 69 y.o. male with extensive stage small cell lung cancer who is seen for assessment prior to cycle #2 maintenance Tecentriq.  HPI: The patient was last seen in the medical oncology clinic on 12/30/2019. At that time, he feelt fine.  He denied any alcohol use for 2 weeks.  He denied any drug use.  Blood pressure was elevated.  Exam is stable.  Hematocrit was 35.9, hemoglobin 12.5, platelets 168,000, WBC 10,4000 (ANC 7800). Sodium was 130.   Tecentriq was held secondary to elevated LFTs (improving).  Hepatitis B DNA was not detected. Hepatitis C RNA quantitative revealed 3.869 log 10 IU/mL (7400 IU/mL).  Hepatitis C genotype is 2b.  He saw seen by Dr. Baruch Gouty on 12/31/2019.  PCI consisting of 2500 cGy in 10 fractions to his whole brain was planned. Patient agreed. He underwent CT stimulation on 01/02/2020.   During the interim, he has been doing well. He continues to have shortness of breath. He will begin radiation on 01/08/2020. He is coughing up phlegm. He has a good appetite and is drinking fluids with electrolytes and Boost x 2. He is taking 2 salt tablets daily. He denies taking any new medications or herbal products. He denies alcohol consumption.   During the visit, Dr. Dorothey Baseman office contacted patient regarding an appointment in 02/2020.  Dr. Allen Norris contacted me during AM clinic and cleared patient for treatment today.   Past Medical History:  Diagnosis Date  . A-fib ([place]) 01/10/2019   pt st this was dx by Dr. Ubaldo Glassing  . Cancer ([place]) 2021   LUNG  . Complication of anesthesia    DIFFICULTY WAKING UP AFTER SURGERY- 20 YRS AGO  . Hypertension   . Lung cancer ([place])   . Substance abuse [facility])     Past Surgical History:  Procedure Laterality Date  . ARM  DEBRIDEMENT Left    INCISION AND DEBRIDEMENT LOWER ARM -20 YRS AGO  . BACK SURGERY    . PORTACATH PLACEMENT Left 08/29/2019   Procedure: INSERTION PORT-A-CATH;  Surgeon: Nestor Lewandowsky, MD;  Location: [facility] ORS;  Service: General;  Laterality: Left;    History reviewed. No pertinent family history.  Social History:  reports that he has quit smoking. His smoking use included cigarettes. He smoked 0.50 packs per day. He has never used smokeless tobacco. He reports current alcohol use. He reports previous drug use. Drug: Heroin. The patient denies any exposure to radiation or toxins. The patient lives in [place]. He is dependent on public transportation or transportation from friends/family. He has family friends staying with him to help care for his needs.His niece, [name] [phone]), is his medical power of attorney. The patient is alone today.    Allergies: No Known Allergies  Current Medications: Current Outpatient Medications  Medication Sig Dispense Refill  . calcium carbonate (CALCIUM 600) 600 MG TABS tablet Take 600 mg by mouth daily with breakfast.    . dexamethasone (DECADRON) 4 MG tablet Take 1 tablet (4 mg total) by mouth daily. 25 tablet 0  . diltiazem (CARDIZEM CD) 240 MG 24 hr capsule Take 1 capsule (240 mg total) by mouth daily. 90 capsule 0  . ELIQUIS 5 MG TABS tablet Take 5 mg by mouth 2 (two) times daily.    Marland Kitchen escitalopram (  LEXAPRO) 10 MG tablet Take 1 tablet (10 mg total) by mouth daily. 90 tablet 0  . feeding supplement, ENSURE ENLIVE, (ENSURE ENLIVE) LIQD Take 237 mLs by mouth 3 (three) times daily between meals. 237 mL 12  . furosemide (LASIX) 20 MG tablet Take 1 tablet (20 mg total) by mouth daily. 20 tablet 1  . HYDROcodone-acetaminophen (NORCO/VICODIN) 5-325 MG tablet Take 1 tablet by mouth every 6 (six) hours as needed for moderate pain. 30 tablet 0  . lidocaine-prilocaine (EMLA) cream Apply to affected area once 30 g 3  . Magnesium 500 MG CAPS Take 500 mg  by mouth daily.    . metoprolol succinate (TOPROL-XL) 50 MG 24 hr tablet Take 1 tablet (50 mg total) by mouth daily. Take with or immediately following a meal. 90 tablet 0  . nystatin (MYCOSTATIN) 100000 UNIT/ML suspension Use as directed 5 mLs (500,000 Units total) in the mouth or throat 4 (four) times daily. Swish and spit 60 mL 0  . ondansetron (ZOFRAN) 8 MG tablet Take 1 tablet (8 mg total) by mouth every 8 (eight) hours as needed for refractory nausea / vomiting. Start on day 3 after carboplatin chemo. 30 tablet 1  . potassium chloride SA (KLOR-CON) 20 MEQ tablet TAKE 1 TABLET BY MOUTH DAILY FOR 3 DAYS (Patient taking differently: Take 20 mEq by mouth daily. ) 3 tablet 0  . sodium chloride 1 g tablet Take 1 tablet (1 g total) by mouth 3 (three) times daily with meals. 90 tablet 1   No current facility-administered medications for this visit.   Facility-Administered Medications Ordered in Other Visits  Medication Dose Route Frequency Provider Last Rate Last Admin  . heparin lock flush 100 unit/mL  500 Units Intravenous Once Daana Petrasek C, MD      . sodium chloride flush (NS) 0.9 % injection 10 mL  10 mL Intravenous PRN Lequita Asal, MD   10 mL at 11/11/19 1287    Review of Systems  Review of Systems  Constitutional: Positive for weight loss (1 lb). Negative for chills, diaphoresis, fever and malaise/fatigue.       Doing good.  HENT: Negative for congestion, ear discharge, ear pain, hearing loss, nosebleeds and sore throat.   Eyes: Negative.  Negative for blurred vision and double vision.  Respiratory: Positive for sputum production (some phlegm) and shortness of breath (on exertion). Negative for cough.   Cardiovascular: Negative.  Negative for chest pain, palpitations and leg swelling.  Gastrointestinal: Negative for constipation, diarrhea, heartburn, nausea and vomiting.       Eating well.  Drinking 2 Boost today.  Genitourinary: Negative.  Negative for dysuria, frequency  and urgency.  Musculoskeletal: Negative.  Negative for falls, myalgias and neck pain.  Skin: Negative for rash.  Neurological: Negative for dizziness, tingling, sensory change, focal weakness, weakness and headaches.  Psychiatric/Behavioral: Negative for depression, memory loss and substance abuse (No ETOH for two weeks). The patient is not nervous/anxious and does not have insomnia.        Notes interval drug use when things bothered him. No drug use reported today.  All other systems reviewed and are negative.  Performance status (ECOG): 1  Vitals Blood pressure (!) 151/96, pulse 64, temperature (!) 97.1 F (36.2 C), temperature source Tympanic, weight 132 lb 15 oz (60.3 kg), SpO2 97 %.   Physical Exam Vitals and nursing note reviewed.  Constitutional:      General: He is not in acute distress.    Appearance: Normal  appearance. He is not ill-appearing, toxic-appearing or diaphoretic.     Interventions: Face mask in place.  HENT:     Head: Normocephalic and atraumatic.     Comments: Shaved head.  Scruffy beard.    Nose: No congestion.     Mouth/Throat:     Mouth: Mucous membranes are moist.     Pharynx: No oropharyngeal exudate or posterior oropharyngeal erythema.  Eyes:     General: No scleral icterus.    Extraocular Movements: Extraocular movements intact.     Conjunctiva/sclera: Conjunctivae normal.     Pupils: Pupils are equal, round, and reactive to light.     Comments: Blue eyes  Cardiovascular:     Rate and Rhythm: Normal rate and regular rhythm.     Heart sounds: No murmur heard.  No gallop.   Pulmonary:     Breath sounds: No wheezing, rhonchi or rales.  Abdominal:     General: There is no distension.     Palpations: Abdomen is soft. There is no hepatomegaly, splenomegaly or mass.     Tenderness: There is no abdominal tenderness. There is no guarding or rebound.  Musculoskeletal:        General: No swelling. Normal range of motion.     Cervical back: Normal range  of motion.     Right lower leg: No edema.     Left lower leg: No edema.  Lymphadenopathy:     Head:     Right side of head: No preauricular, posterior auricular or occipital adenopathy.     Left side of head: No preauricular, posterior auricular or occipital adenopathy.     Cervical: No cervical adenopathy.     Upper Body:     Right upper body: No supraclavicular or axillary adenopathy.     Left upper body: No supraclavicular or axillary adenopathy.     Lower Body: No right inguinal adenopathy. No left inguinal adenopathy.  Skin:    General: Skin is warm and dry.     Findings: No bruising, erythema or rash.  Neurological:     Mental Status: He is alert and oriented to person, place, and time.     Sensory: No sensory deficit.     Motor: No weakness.  Psychiatric:        Mood and Affect: Mood normal.        Behavior: Behavior normal.        Thought Content: Thought content normal.        Judgment: Judgment normal.     Imaging studies: 08/19/2019:  Chest CT without contrast revealed the left pleural pigtail drain. There was a small residual left hydropneumothorax. There was a 6.7 x 4 cm medial basilar left lower lobe lung masssuspicious for primary bronchogenic carcinoma. There was a lingular 1.0 cm solid pulmonary nodulesuspicious for ipsilateral metastasis. There was intralobular septal thickening and patchy groundglass opacity throughout the lingula and the left lower lobe with moderate residual atelectasis of the left lung base. A component of lymphangitic carcinomatosis was suspected. There was ipsilateral infra hilar, subcarinal, AP window, prevascular and upper retroperitoneal adenopathyc/wmetastatic disease. 08/21/2019:  Head MRI revealed no evidence of metastatic disease. 08/29/2019:  PET scan revealed markedly hypermetabolic left lower lobe pulmonary lesion (SUV 7.9) c/w the patient's known malignancy. There was associated hypermetabolic nodal metastases in the  mediastinum and left hilum with metastatic involvement of left pleural space. Additional sites included upper abdominal hypermetabolic lymphadenopathy and scattered hypermetabolic bone metastases (left acetabulum, sacrum, and L5). There was  interval decrease in left pleural effusion with areas of loculation. 10/11/2019:  Chest, abdomen, and pelvis CT revealed considerable reduction in size of prior hypermetabolic lymph nodes in the chest and abdomen, compatible with response to therapy. There was notably reduced size of the left pleural effusion although some complexity or loculation persists. The previous areas of hypermetabolic nodularity along the pleural surface were not obviously nodular along the pleural surface. There was considerable reduction in size of the previously consolidated/collapsed left lower lobe, although there was still some residual indistinct opacity in this region. The previous hypermetabolic osseous metastatic lesions were relatively occult.  There was mild airway thickening was present, suggesting bronchitis or reactive airways disease. There was trace pelvic ascites, improved from prior. There was chronic deformity of the left medial clavicle likely due to an old fracture with some deformity of the left sternoclavicular joint. 11/23/2019: Chest CT angiogram revealed no pulmonary embolus.  There was new patchy and ground-glass opacity in the dependent right lower lobe, perifissural right middle and right upper lobe. Findings were suspicious for pneumonia, including aspiration or atypical viral organisms such as COVID-19. There was a small left pleural effusion with slight increase size from prior exam. Volume loss in the left lower lobe with areas of ill-defined consolidation, was not significantly changed from prior. Right hilar adenopathy was new from prior exam, nonspecific in this setting. Recommend attention at follow-up. There was debris within the trachea and to a lesser extent  right mainstem bronchus. There was right lower lobe bronchial thickening with areas of mucous plugging.  11/29/2019:  Abdomen and pelvis CT revealed residual patchy nodular foci of consolidation at the right lung base, overall decreased since 11/22/2019 favoring resolving bronchopneumonia.  Follow-up chest CT was advised.  There was a stable small dependent left pleural effusion and stable top normal size previously hypermetabolic left periaortic lymph node.  There was stable faint sclerotic left pelvic bone lesions.  There were no new or progressive metastatic disease in the abdomen or pelvis.   Infusion on 01/06/2020  Component Date Value Ref Range Status  . Sodium 01/06/2020 133* 135 - 145 mmol/L Final  . Potassium 01/06/2020 3.6  3.5 - 5.1 mmol/L Final  . Chloride 01/06/2020 98  98 - 111 mmol/L Final  . CO2 01/06/2020 26  22 - 32 mmol/L Final  . Glucose, Bld 01/06/2020 129* 70 - 99 mg/dL Final   Glucose reference range applies only to samples taken after fasting for at least 8 hours.  . BUN 01/06/2020 8  8 - 23 mg/dL Final  . Creatinine, Ser 01/06/2020 0.62  0.61 - 1.24 mg/dL Final  . Calcium 01/06/2020 8.9  8.9 - 10.3 mg/dL Final  . Total Protein 01/06/2020 7.3  6.5 - 8.1 g/dL Final  . Albumin 01/06/2020 3.3* 3.5 - 5.0 g/dL Final  . AST 01/06/2020 24  15 - 41 U/L Final  . ALT 01/06/2020 22  0 - 44 U/L Final  . Alkaline Phosphatase 01/06/2020 83  38 - 126 U/L Final  . Total Bilirubin 01/06/2020 0.7  0.3 - 1.2 mg/dL Final  . GFR calc non Af Amer 01/06/2020 >60  >60 mL/min Final  . GFR calc Af Amer 01/06/2020 >60  >60 mL/min Final  . Anion gap 01/06/2020 9  5 - 15 Final   Performed at [facility], [address] [place], [place] [address]  . WBC 01/06/2020 6.0  4.0 - 10.5 K/uL Final  . RBC 01/06/2020 3.53* 4.22 - 5.81 MIL/uL  Final  . Hemoglobin 01/06/2020 12.1* 13.0 - 17.0 g/dL Final  . HCT 01/06/2020 34.5* 39 - 52 % Final  . MCV 01/06/2020 97.7  80.0 - 100.0 fL Final   . MCH 01/06/2020 34.3* 26.0 - 34.0 pg Final  . MCHC 01/06/2020 35.1  30.0 - 36.0 g/dL Final  . RDW 01/06/2020 13.6  11.5 - 15.5 % Final  . Platelets 01/06/2020 260  150 - 400 K/uL Final  . nRBC 01/06/2020 0.0  0.0 - 0.2 % Final  . Neutrophils Relative % 01/06/2020 66  % Final  . Neutro Abs 01/06/2020 3.9  1.7 - 7.7 K/uL Final  . Lymphocytes Relative 01/06/2020 18  % Final  . Lymphs Abs 01/06/2020 1.1  0.7 - 4.0 K/uL Final  . Monocytes Relative 01/06/2020 11  % Final  . Monocytes Absolute 01/06/2020 0.6  0 - 1 K/uL Final  . Eosinophils Relative 01/06/2020 4  % Final  . Eosinophils Absolute 01/06/2020 0.3  0 - 0 K/uL Final  . Basophils Relative 01/06/2020 0  % Final  . Basophils Absolute 01/06/2020 0.0  0 - 0 K/uL Final  . Immature Granulocytes 01/06/2020 1  % Final  . Abs Immature Granulocytes 01/06/2020 0.07  0.00 - 0.07 K/uL Final   Performed at [facility], [address], [place], [place] [address]    Assessment:  RODRIGO MCGRANAHAN is a 69 y.o. male with extensive stage small cell lung cancer. He has a >40 pack year smoking. He presented with a recurrent left sided pleural effusion. He has undergone thoracentesis x 2 in the past month. He is s/p pigtail drain. Initial cytology x 2 was negative (lymphocytic exudative effusion).Pleural fluid cytologyon 08/18/2019 confirmed small cell lung cancer.  Chest CT without contraston 08/19/2019 revealed the left pleural pigtail drain. There was a small residual left hydropneumothorax. There was a 6.7 x 4 cm medial basilar left lower lobe lung masssuspicious for primary bronchogenic carcinoma. There was a lingular 1.0 cm solid pulmonary nodulesuspicious for ipsilateral metastasis. There was intralobular septal thickening and patchy groundglass opacity throughout the lingula and the left lower lobe with moderate residual atelectasis of the left lung base. A component of lymphangitic carcinomatosis was suspected. There was  ipsilateral infra hilar, subcarinal, AP window, prevascular and upper retroperitoneal adenopathyc/wmetastatic disease.  Head MRI on 08/21/2019 revealed no evidence of metastatic disease.  PET scan on 05/20/20201 revealed markedly hypermetabolic left lower lobe pulmonary lesion (SUV 7.9) c/w the patient's known malignancy. There was associated hypermetabolic nodal metastases in the mediastinum and left hilum with metastatic involvement of left pleural space. Additional sites included upper abdominal hypermetabolic lymphadenopathy and scattered hypermetabolic bone metastases (left acetabulum, sacrum, and L5). There was interval decrease in left pleural effusion with areas of loculation.  CEA was 3.7 on 09/02/2019.  LDH was 206 on 09/23/2019.  He is s/p 4 cycles of carboplatin, etoposide, and Tecentriq (09/02/2019- 11/11/2019) with Udencya support  Chest CT angiogram on 11/23/2019 revealed no pulmonary embolus.  There was new patchy and ground-glass opacity in the dependent right lower lobe, perifissural right middle and right upper lobe. Findings were suspicious for pneumonia, including aspiration or atypical viral organisms such as COVID-19. There was a small left pleural effusion with slight increase size from prior exam. Volume loss in the left lower lobe with areas of ill-defined consolidation, was not significantly changed from prior. Right hilar adenopathy was new from prior exam, nonspecific in this setting. Recommend attention at follow-up. There was debris within the  trachea and to a lesser extent right mainstem bronchus. There was right lower lobe bronchial thickening with areas of mucous plugging.   Abdomen and pelvis CT on 11/29/2019 revealed residual patchy nodular foci of consolidation at the right lung base, overall decreased since 11/22/2019 favoring resolving bronchopneumonia.  Follow-up chest CT was advised.  There was a stable small dependent left pleural effusion and stable top  normal size previously hypermetabolic left periaortic lymph node.  There was stable faint sclerotic left pelvic bone lesions.  There were no new or progressive metastatic disease in the abdomen or pelvis.  He was admitted to [facility] from 11/22/2019 - 11/25/2019 with a heroin overdose and aspiration pneumonia.  He received 1 unit of PRBCs.  He was treated with Zosyn.  He was discharged on Augmentin x5 days.  He has hyponatremiasecondary to SIADH.  He began sodium chloride tablets (1 gm TID) on 09/04/2019.  He has elevated LFTs. Hepatitis C antibody and hepatitis B core antibody total were positive on 12/23/2019.  Hepatitis B surface antigen, hepatitis B core IgM and hepatitis A IgM were negative.  Hepatitis B DNA was not detected. Hepatitis C RNA quantitative revealed 3.869 log 10 IU/mL (7400 IU/mL).  Hepatitis C genotype is 2b.  He has a significant family history of malignancy. A family member has had genetic testing (negative for Lynch syndrome).  Symptomatically, he feels good.  He denies any concerns.  He denies any alcohol intake.  Exam is stable.  Plan: 1.   Labs today: CBC with differential, CMP, T4. 2. Metastatic small cell lung cancer Clinically, he is doing well. He is s/p 4 cycles of carboplatin, etoposide and Tecentriq (last 11/11/2019). Chest, abdomen, and pelvis CT on 10/11/2019 revealed a dramatic response to treatment. Chest CT angiogram plus abdomen and pelvis CT scanrevealed adramatic response to treatment. He iss/pcycle #1 maintenance Tecentriq (12/02/2019). Cycle #2 maintenance Tecentriq was held secondary to increased LFTs on 12/30/2019. No current evidence for Tecentriq induced hepatitis.    LFTs resolved spontaneously without steroids.   Etiology felt secondary to possible alcohol or less likely hepatitis C.    Discuss plan to monitor LFTs closely. He is  scheduled to begin PCI in the near future. 3. Hyponatremia Sodium133.  Continue salt tablets twice daily.  Continue free water restriction and fluids with electrolytes. Etiology secondary to SIADH. 4. History of atrial fibrillation Remains on Eliquis. Patient denies any bleeding. 5. Chemotherapy induced anemia Hematocrit 33.9. Hemoglobin 11.7. MCV 100.9 on 12/23/2019. Ferritin 910 with an iron saturation 65% and a TIBC 280 on 09/16/2019. B12 was 673 and folate 8.6 on 09/16/2019.              Hematocrit 34.5.  Hemoglobin 12.1.  MCV 97.7 on 01/06/2020. Continue to monitor. 6. Elevated LFTs AST 20. ALT 12. Bilirubin 0.6. Alkaline phosphatase   70 on 12/02/2019. AST 134. ALT 132. Bilirubin 0.3. Alkaline phosphatase   85 on 12/23/2019.             AST   34.  ALT   49.  Bilirubin 1.0.  Alkaline phosphatase 100 on 12/30/2019.  AST   24.  ALT   22.  Bilirubin 0.7.  Alkaline phosphatase   83 on 01/06/2020. Etiology felt secondary to alcohol versus hepatitis C. He has a history of untreated hepatitis C.             Phone follow-up with Dr. Allen Norris today.  Discussed with pharmacy.  Follow-up with Dr. Allen Norris in clinic. 7.  Maintenance cycle #2 Tecentriq today. 8.   RTC weekly x 2 for labs (LFTs). 9.   RTC in 3 weeks for MD assessment, labs (CBC with diff, CMP, Mg, TSH), and cycle #3 maintenance Tecentriq..  Article:  Safety and efficacy of immune checkpoint inhibitors in patients with H hepatitis B virus/hepatitis C virus infection and advanced age cancer: A systemic review by Maryellen Pile 2020 Jan; 99(5):e19013.  I discussed the assessment and treatment plan with the patient.  The patient was provided an opportunity to ask questions and all were answered.  The patient agreed with the  plan and demonstrated an understanding of the instructions.  The patient was advised to call back if the symptoms worsen or if the condition fails to improve as anticipated.   Lequita Asal, MD, PhD    01/06/2020, 9:11 AM  I, Selena Batten, am acting as scribe for [employer]. Mike Gip, MD, PhD.  I, Breelyn Icard C. Mike Gip, MD, have reviewed the above documentation for accuracy and completeness, and I agree with the above.

## 2020-01-06 ENCOUNTER — Encounter: Payer: Self-pay | Admitting: Hematology and Oncology

## 2020-01-06 ENCOUNTER — Inpatient Hospital Stay: Payer: Medicare Other

## 2020-01-06 ENCOUNTER — Inpatient Hospital Stay (HOSPITAL_BASED_OUTPATIENT_CLINIC_OR_DEPARTMENT_OTHER): Payer: Medicare Other | Admitting: Hematology and Oncology

## 2020-01-06 ENCOUNTER — Other Ambulatory Visit: Payer: Self-pay

## 2020-01-06 ENCOUNTER — Ambulatory Visit: Payer: Medicare Other

## 2020-01-06 VITALS — BP 156/92 | HR 66 | Resp 16

## 2020-01-06 VITALS — BP 151/96 | HR 64 | Temp 97.1°F | Wt 132.9 lb

## 2020-01-06 DIAGNOSIS — R7989 Other specified abnormal findings of blood chemistry: Secondary | ICD-10-CM

## 2020-01-06 DIAGNOSIS — E871 Hypo-osmolality and hyponatremia: Secondary | ICD-10-CM

## 2020-01-06 DIAGNOSIS — Z5112 Encounter for antineoplastic immunotherapy: Secondary | ICD-10-CM | POA: Diagnosis not present

## 2020-01-06 DIAGNOSIS — C3490 Malignant neoplasm of unspecified part of unspecified bronchus or lung: Secondary | ICD-10-CM | POA: Diagnosis not present

## 2020-01-06 DIAGNOSIS — I4891 Unspecified atrial fibrillation: Secondary | ICD-10-CM | POA: Diagnosis not present

## 2020-01-06 DIAGNOSIS — Z7901 Long term (current) use of anticoagulants: Secondary | ICD-10-CM | POA: Diagnosis not present

## 2020-01-06 DIAGNOSIS — Z87891 Personal history of nicotine dependence: Secondary | ICD-10-CM | POA: Diagnosis not present

## 2020-01-06 DIAGNOSIS — T451X5A Adverse effect of antineoplastic and immunosuppressive drugs, initial encounter: Secondary | ICD-10-CM | POA: Diagnosis not present

## 2020-01-06 DIAGNOSIS — Z79899 Other long term (current) drug therapy: Secondary | ICD-10-CM | POA: Diagnosis not present

## 2020-01-06 DIAGNOSIS — D6481 Anemia due to antineoplastic chemotherapy: Secondary | ICD-10-CM | POA: Diagnosis not present

## 2020-01-06 DIAGNOSIS — E222 Syndrome of inappropriate secretion of antidiuretic hormone: Secondary | ICD-10-CM | POA: Diagnosis not present

## 2020-01-06 DIAGNOSIS — I10 Essential (primary) hypertension: Secondary | ICD-10-CM | POA: Diagnosis not present

## 2020-01-06 LAB — CBC WITH DIFFERENTIAL/PLATELET
Abs Immature Granulocytes: 0.07 10*3/uL (ref 0.00–0.07)
Basophils Absolute: 0 10*3/uL (ref 0.0–0.1)
Basophils Relative: 0 %
Eosinophils Absolute: 0.3 10*3/uL (ref 0.0–0.5)
Eosinophils Relative: 4 %
HCT: 34.5 % — ABNORMAL LOW (ref 39.0–52.0)
Hemoglobin: 12.1 g/dL — ABNORMAL LOW (ref 13.0–17.0)
Immature Granulocytes: 1 %
Lymphocytes Relative: 18 %
Lymphs Abs: 1.1 10*3/uL (ref 0.7–4.0)
MCH: 34.3 pg — ABNORMAL HIGH (ref 26.0–34.0)
MCHC: 35.1 g/dL (ref 30.0–36.0)
MCV: 97.7 fL (ref 80.0–100.0)
Monocytes Absolute: 0.6 10*3/uL (ref 0.1–1.0)
Monocytes Relative: 11 %
Neutro Abs: 3.9 10*3/uL (ref 1.7–7.7)
Neutrophils Relative %: 66 %
Platelets: 260 10*3/uL (ref 150–400)
RBC: 3.53 MIL/uL — ABNORMAL LOW (ref 4.22–5.81)
RDW: 13.6 % (ref 11.5–15.5)
WBC: 6 10*3/uL (ref 4.0–10.5)
nRBC: 0 % (ref 0.0–0.2)

## 2020-01-06 LAB — COMPREHENSIVE METABOLIC PANEL
ALT: 22 U/L (ref 0–44)
AST: 24 U/L (ref 15–41)
Albumin: 3.3 g/dL — ABNORMAL LOW (ref 3.5–5.0)
Alkaline Phosphatase: 83 U/L (ref 38–126)
Anion gap: 9 (ref 5–15)
BUN: 8 mg/dL (ref 8–23)
CO2: 26 mmol/L (ref 22–32)
Calcium: 8.9 mg/dL (ref 8.9–10.3)
Chloride: 98 mmol/L (ref 98–111)
Creatinine, Ser: 0.62 mg/dL (ref 0.61–1.24)
GFR calc Af Amer: >60 mL/min (ref >60)
GFR calc non Af Amer: >60 mL/min (ref >60)
Glucose, Bld: 129 mg/dL — ABNORMAL HIGH (ref 70–99)
Potassium: 3.6 mmol/L (ref 3.5–5.1)
Sodium: 133 mmol/L — ABNORMAL LOW (ref 135–145)
Total Bilirubin: 0.7 mg/dL (ref 0.3–1.2)
Total Protein: 7.3 g/dL (ref 6.5–8.1)

## 2020-01-06 MED ORDER — HEPARIN SOD (PORK) LOCK FLUSH 100 UNIT/ML IV SOLN
INTRAVENOUS | Status: AC
  Filled 2020-01-06: qty 5

## 2020-01-06 MED ORDER — HEPARIN SOD (PORK) LOCK FLUSH 100 UNIT/ML IV SOLN
500.0000 [IU] | Freq: Once | INTRAVENOUS | Status: AC | PRN
  Administered 2020-01-06: 500 [IU]
  Filled 2020-01-06: qty 5

## 2020-01-06 MED ORDER — SODIUM CHLORIDE 0.9 % IV SOLN
1200.0000 mg | Freq: Once | INTRAVENOUS | Status: AC
  Administered 2020-01-06: 1200 mg via INTRAVENOUS
  Filled 2020-01-06: qty 20

## 2020-01-06 MED ORDER — SODIUM CHLORIDE 0.9 % IV SOLN
Freq: Once | INTRAVENOUS | Status: AC
  Filled 2020-01-06: qty 250

## 2020-01-06 NOTE — Progress Notes (Signed)
No new changes noted today 

## 2020-01-07 LAB — T4: T4, Total: 7 ug/dL (ref 4.5–12.0)

## 2020-01-08 ENCOUNTER — Ambulatory Visit: Admission: RE | Admit: 2020-01-08 | Payer: Medicare Other | Source: Ambulatory Visit

## 2020-01-08 DIAGNOSIS — Z298 Encounter for other specified prophylactic measures: Secondary | ICD-10-CM | POA: Diagnosis not present

## 2020-01-08 DIAGNOSIS — Z87891 Personal history of nicotine dependence: Secondary | ICD-10-CM | POA: Diagnosis not present

## 2020-01-08 DIAGNOSIS — C3432 Malignant neoplasm of lower lobe, left bronchus or lung: Secondary | ICD-10-CM | POA: Diagnosis not present

## 2020-01-08 DIAGNOSIS — Z510 Encounter for antineoplastic radiation therapy: Secondary | ICD-10-CM | POA: Diagnosis not present

## 2020-01-09 ENCOUNTER — Ambulatory Visit
Admission: RE | Admit: 2020-01-09 | Discharge: 2020-01-09 | Disposition: A | Discharge status: Home / Self Care | Payer: Medicare Other | Source: Ambulatory Visit | Attending: Radiation Oncology | Admitting: Radiation Oncology

## 2020-01-09 DIAGNOSIS — Z298 Encounter for other specified prophylactic measures: Secondary | ICD-10-CM | POA: Diagnosis not present

## 2020-01-09 DIAGNOSIS — Z87891 Personal history of nicotine dependence: Secondary | ICD-10-CM | POA: Diagnosis not present

## 2020-01-09 DIAGNOSIS — C3432 Malignant neoplasm of lower lobe, left bronchus or lung: Secondary | ICD-10-CM | POA: Diagnosis not present

## 2020-01-09 DIAGNOSIS — Z510 Encounter for antineoplastic radiation therapy: Secondary | ICD-10-CM | POA: Diagnosis not present

## 2020-01-10 ENCOUNTER — Ambulatory Visit
Admission: RE | Admit: 2020-01-10 | Discharge: 2020-01-10 | Disposition: A | Discharge status: Home / Self Care | Payer: Medicare Other | Source: Ambulatory Visit | Attending: Radiation Oncology | Admitting: Radiation Oncology

## 2020-01-10 DIAGNOSIS — Z298 Encounter for other specified prophylactic measures: Secondary | ICD-10-CM | POA: Insufficient documentation

## 2020-01-10 DIAGNOSIS — Z510 Encounter for antineoplastic radiation therapy: Secondary | ICD-10-CM | POA: Insufficient documentation

## 2020-01-10 DIAGNOSIS — C3432 Malignant neoplasm of lower lobe, left bronchus or lung: Secondary | ICD-10-CM | POA: Insufficient documentation

## 2020-01-10 DIAGNOSIS — Z87891 Personal history of nicotine dependence: Secondary | ICD-10-CM | POA: Diagnosis not present

## 2020-01-13 ENCOUNTER — Other Ambulatory Visit: Payer: Self-pay

## 2020-01-13 ENCOUNTER — Inpatient Hospital Stay: Payer: Medicare Other | Attending: Radiation Oncology

## 2020-01-13 ENCOUNTER — Ambulatory Visit
Admission: RE | Admit: 2020-01-13 | Discharge: 2020-01-13 | Disposition: A | Discharge status: Home / Self Care | Payer: Medicare Other | Source: Ambulatory Visit | Attending: Radiation Oncology | Admitting: Radiation Oncology

## 2020-01-13 DIAGNOSIS — Z510 Encounter for antineoplastic radiation therapy: Secondary | ICD-10-CM | POA: Diagnosis not present

## 2020-01-13 DIAGNOSIS — C3412 Malignant neoplasm of upper lobe, left bronchus or lung: Secondary | ICD-10-CM | POA: Diagnosis not present

## 2020-01-13 DIAGNOSIS — I4891 Unspecified atrial fibrillation: Secondary | ICD-10-CM | POA: Insufficient documentation

## 2020-01-13 DIAGNOSIS — Z87891 Personal history of nicotine dependence: Secondary | ICD-10-CM | POA: Diagnosis not present

## 2020-01-13 DIAGNOSIS — Z298 Encounter for other specified prophylactic measures: Secondary | ICD-10-CM | POA: Diagnosis not present

## 2020-01-13 DIAGNOSIS — Z7901 Long term (current) use of anticoagulants: Secondary | ICD-10-CM | POA: Insufficient documentation

## 2020-01-13 DIAGNOSIS — C3432 Malignant neoplasm of lower lobe, left bronchus or lung: Secondary | ICD-10-CM | POA: Diagnosis not present

## 2020-01-13 DIAGNOSIS — R7989 Other specified abnormal findings of blood chemistry: Secondary | ICD-10-CM

## 2020-01-13 DIAGNOSIS — Z5112 Encounter for antineoplastic immunotherapy: Secondary | ICD-10-CM | POA: Insufficient documentation

## 2020-01-13 DIAGNOSIS — C3490 Malignant neoplasm of unspecified part of unspecified bronchus or lung: Secondary | ICD-10-CM

## 2020-01-13 LAB — HEPATIC FUNCTION PANEL
ALT: 18 U/L (ref 0–44)
AST: 25 U/L (ref 15–41)
Albumin: 3.5 g/dL (ref 3.5–5.0)
Alkaline Phosphatase: 87 U/L (ref 38–126)
Bilirubin, Direct: 0.2 mg/dL (ref 0.0–0.2)
Indirect Bilirubin: 0.7 mg/dL (ref 0.3–0.9)
Total Bilirubin: 0.9 mg/dL (ref 0.3–1.2)
Total Protein: 8.2 g/dL — ABNORMAL HIGH (ref 6.5–8.1)

## 2020-01-14 ENCOUNTER — Ambulatory Visit
Admission: RE | Admit: 2020-01-14 | Discharge: 2020-01-14 | Disposition: A | Discharge status: Home / Self Care | Payer: Medicare Other | Source: Ambulatory Visit | Attending: Radiation Oncology | Admitting: Radiation Oncology

## 2020-01-14 DIAGNOSIS — Z298 Encounter for other specified prophylactic measures: Secondary | ICD-10-CM | POA: Diagnosis not present

## 2020-01-14 DIAGNOSIS — C3432 Malignant neoplasm of lower lobe, left bronchus or lung: Secondary | ICD-10-CM | POA: Diagnosis not present

## 2020-01-14 DIAGNOSIS — Z87891 Personal history of nicotine dependence: Secondary | ICD-10-CM | POA: Diagnosis not present

## 2020-01-14 DIAGNOSIS — Z510 Encounter for antineoplastic radiation therapy: Secondary | ICD-10-CM | POA: Diagnosis not present

## 2020-01-15 ENCOUNTER — Ambulatory Visit
Admission: RE | Admit: 2020-01-15 | Discharge: 2020-01-15 | Disposition: A | Discharge status: Home / Self Care | Payer: Medicare Other | Source: Ambulatory Visit | Attending: Radiation Oncology | Admitting: Radiation Oncology

## 2020-01-15 DIAGNOSIS — Z87891 Personal history of nicotine dependence: Secondary | ICD-10-CM | POA: Diagnosis not present

## 2020-01-15 DIAGNOSIS — Z510 Encounter for antineoplastic radiation therapy: Secondary | ICD-10-CM | POA: Diagnosis not present

## 2020-01-15 DIAGNOSIS — C3432 Malignant neoplasm of lower lobe, left bronchus or lung: Secondary | ICD-10-CM | POA: Diagnosis not present

## 2020-01-15 DIAGNOSIS — Z298 Encounter for other specified prophylactic measures: Secondary | ICD-10-CM | POA: Diagnosis not present

## 2020-01-16 ENCOUNTER — Ambulatory Visit
Admission: RE | Admit: 2020-01-16 | Discharge: 2020-01-16 | Disposition: A | Discharge status: Home / Self Care | Payer: Medicare Other | Source: Ambulatory Visit | Attending: Radiation Oncology | Admitting: Radiation Oncology

## 2020-01-16 ENCOUNTER — Other Ambulatory Visit: Payer: Medicare Other

## 2020-01-16 DIAGNOSIS — C3432 Malignant neoplasm of lower lobe, left bronchus or lung: Secondary | ICD-10-CM | POA: Diagnosis not present

## 2020-01-16 DIAGNOSIS — Z298 Encounter for other specified prophylactic measures: Secondary | ICD-10-CM | POA: Diagnosis not present

## 2020-01-16 DIAGNOSIS — Z87891 Personal history of nicotine dependence: Secondary | ICD-10-CM | POA: Diagnosis not present

## 2020-01-16 DIAGNOSIS — Z510 Encounter for antineoplastic radiation therapy: Secondary | ICD-10-CM | POA: Diagnosis not present

## 2020-01-17 ENCOUNTER — Ambulatory Visit
Admission: RE | Admit: 2020-01-17 | Discharge: 2020-01-17 | Disposition: A | Discharge status: Home / Self Care | Payer: Medicare Other | Source: Ambulatory Visit | Attending: Radiation Oncology | Admitting: Radiation Oncology

## 2020-01-17 DIAGNOSIS — Z87891 Personal history of nicotine dependence: Secondary | ICD-10-CM | POA: Diagnosis not present

## 2020-01-17 DIAGNOSIS — Z298 Encounter for other specified prophylactic measures: Secondary | ICD-10-CM | POA: Diagnosis not present

## 2020-01-17 DIAGNOSIS — Z510 Encounter for antineoplastic radiation therapy: Secondary | ICD-10-CM | POA: Diagnosis not present

## 2020-01-17 DIAGNOSIS — C3432 Malignant neoplasm of lower lobe, left bronchus or lung: Secondary | ICD-10-CM | POA: Diagnosis not present

## 2020-01-20 ENCOUNTER — Ambulatory Visit
Admission: RE | Admit: 2020-01-20 | Discharge: 2020-01-20 | Disposition: A | Discharge status: Home / Self Care | Payer: Medicare Other | Source: Ambulatory Visit | Attending: Radiation Oncology | Admitting: Radiation Oncology

## 2020-01-20 ENCOUNTER — Inpatient Hospital Stay: Payer: Medicare Other

## 2020-01-20 ENCOUNTER — Other Ambulatory Visit: Payer: Self-pay

## 2020-01-20 DIAGNOSIS — Z510 Encounter for antineoplastic radiation therapy: Secondary | ICD-10-CM | POA: Diagnosis not present

## 2020-01-20 DIAGNOSIS — Z5112 Encounter for antineoplastic immunotherapy: Secondary | ICD-10-CM | POA: Diagnosis not present

## 2020-01-20 DIAGNOSIS — R7989 Other specified abnormal findings of blood chemistry: Secondary | ICD-10-CM

## 2020-01-20 DIAGNOSIS — Z298 Encounter for other specified prophylactic measures: Secondary | ICD-10-CM | POA: Diagnosis not present

## 2020-01-20 DIAGNOSIS — C3490 Malignant neoplasm of unspecified part of unspecified bronchus or lung: Secondary | ICD-10-CM

## 2020-01-20 DIAGNOSIS — I4891 Unspecified atrial fibrillation: Secondary | ICD-10-CM | POA: Diagnosis not present

## 2020-01-20 DIAGNOSIS — C3412 Malignant neoplasm of upper lobe, left bronchus or lung: Secondary | ICD-10-CM | POA: Diagnosis not present

## 2020-01-20 DIAGNOSIS — Z87891 Personal history of nicotine dependence: Secondary | ICD-10-CM | POA: Diagnosis not present

## 2020-01-20 DIAGNOSIS — Z7901 Long term (current) use of anticoagulants: Secondary | ICD-10-CM | POA: Diagnosis not present

## 2020-01-20 DIAGNOSIS — C3432 Malignant neoplasm of lower lobe, left bronchus or lung: Secondary | ICD-10-CM | POA: Diagnosis not present

## 2020-01-20 LAB — HEPATIC FUNCTION PANEL
ALT: 18 U/L (ref 0–44)
AST: 26 U/L (ref 15–41)
Albumin: 3.6 g/dL (ref 3.5–5.0)
Alkaline Phosphatase: 87 U/L (ref 38–126)
Bilirubin, Direct: 0.1 mg/dL (ref 0.0–0.2)
Indirect Bilirubin: 0.6 mg/dL (ref 0.3–0.9)
Total Bilirubin: 0.7 mg/dL (ref 0.3–1.2)
Total Protein: 7.8 g/dL (ref 6.5–8.1)

## 2020-01-21 ENCOUNTER — Ambulatory Visit
Admission: RE | Admit: 2020-01-21 | Discharge: 2020-01-21 | Disposition: A | Discharge status: Home / Self Care | Payer: Medicare Other | Source: Ambulatory Visit | Attending: Radiation Oncology | Admitting: Radiation Oncology

## 2020-01-21 DIAGNOSIS — C3432 Malignant neoplasm of lower lobe, left bronchus or lung: Secondary | ICD-10-CM | POA: Diagnosis not present

## 2020-01-21 DIAGNOSIS — Z298 Encounter for other specified prophylactic measures: Secondary | ICD-10-CM | POA: Diagnosis not present

## 2020-01-21 DIAGNOSIS — Z510 Encounter for antineoplastic radiation therapy: Secondary | ICD-10-CM | POA: Diagnosis not present

## 2020-01-21 DIAGNOSIS — Z87891 Personal history of nicotine dependence: Secondary | ICD-10-CM | POA: Diagnosis not present

## 2020-01-22 ENCOUNTER — Ambulatory Visit
Admission: RE | Admit: 2020-01-22 | Discharge: 2020-01-22 | Disposition: A | Discharge status: Home / Self Care | Payer: Medicare Other | Source: Ambulatory Visit | Attending: Radiation Oncology | Admitting: Radiation Oncology

## 2020-01-22 DIAGNOSIS — Z298 Encounter for other specified prophylactic measures: Secondary | ICD-10-CM | POA: Diagnosis not present

## 2020-01-22 DIAGNOSIS — Z87891 Personal history of nicotine dependence: Secondary | ICD-10-CM | POA: Diagnosis not present

## 2020-01-22 DIAGNOSIS — C3432 Malignant neoplasm of lower lobe, left bronchus or lung: Secondary | ICD-10-CM | POA: Diagnosis not present

## 2020-01-22 DIAGNOSIS — Z510 Encounter for antineoplastic radiation therapy: Secondary | ICD-10-CM | POA: Diagnosis not present

## 2020-01-23 NOTE — Progress Notes (Signed)
[facility]  [address] [place], [place] [address] Phone: [phone]  Fax: [phone]   Clinic Day:  01/27/2020  Referring physician: No ref. provider found  Chief Complaint: Scott Jordan is a 69 y.o. male with extensive stage small cell lung cancer who is seen for assessment prior to cycle #3 maintenance Tecentriq.  HPI: The patient was last seen in the medical oncology clinic on 01/06/2020. At that time, he felt good.  He denied any concerns.  He denied any alcohol intake.  Exam was stable. Hematocrit was 34.5, hemoglobin 12.1, MCV 97.7, platelets 260,000, WBC 6,000. Sodium was 133. Albumin was 3.3. LFTs were normal.  T4 was 7.0. He received cycle #2 maintenance Tecentriq.  He was seen in consultation by Dr Baruch Gouty on 12/31/2019 for PCI.  Plan was 2500 cGy in 10 fractions to the whole brain.  The patient received PCI from 01/09/2020 - 01/22/2020.   Follow-up LFTs during the interim were normal.  He has an appointment with Dr Allen Norris on 02/26/2020.  During the interim, he had been good. His appetite has improved. He has been eating well and drinking Gatorade. He is still a little bit short of breath and coughs up phlegm. He states that he has not been sleeping well.  He denies any recent drug or alcohol use.    Past Medical History:  Diagnosis Date  . A-fib ([place]) 01/10/2019   pt st this was dx by Dr. Ubaldo Glassing  . Cancer ([place]) 2021   LUNG  . Complication of anesthesia    DIFFICULTY WAKING UP AFTER SURGERY- 20 YRS AGO  . Hypertension   . Lung cancer ([place])   . Substance abuse [facility])     Past Surgical History:  Procedure Laterality Date  . ARM DEBRIDEMENT Left    INCISION AND DEBRIDEMENT LOWER ARM -20 YRS AGO  . BACK SURGERY    . PORTACATH PLACEMENT Left 08/29/2019   Procedure: INSERTION PORT-A-CATH;  Surgeon: Nestor Lewandowsky, MD;  Location: [facility] ORS;  Service: General;  Laterality: Left;    History reviewed. No pertinent family  history.  Social History:  reports that he has quit smoking. His smoking use included cigarettes. He smoked 0.50 packs per day. He has never used smokeless tobacco. He reports current alcohol use. He reports previous drug use. Drug: Heroin. The patient denies any exposure to radiation or toxins. The patient lives in [place]. He is dependent on public transportation or transportation from friends/family. He has family friends staying with him to help care for his needs.His niece, [name] [phone]), is his medical power of attorney. The patient is alone today.    Allergies: No Known Allergies  Current Medications: Current Outpatient Medications  Medication Sig Dispense Refill  . calcium carbonate (CALCIUM 600) 600 MG TABS tablet Take 600 mg by mouth daily with breakfast.    . dexamethasone (DECADRON) 4 MG tablet Take 1 tablet (4 mg total) by mouth daily. 25 tablet 0  . diltiazem (CARDIZEM CD) 240 MG 24 hr capsule Take 1 capsule (240 mg total) by mouth daily. 90 capsule 0  . ELIQUIS 5 MG TABS tablet Take 5 mg by mouth 2 (two) times daily.    Marland Kitchen escitalopram (LEXAPRO) 10 MG tablet Take 1 tablet (10 mg total) by mouth daily. 90 tablet 0  . feeding supplement, ENSURE ENLIVE, (ENSURE ENLIVE) LIQD Take 237 mLs by mouth 3 (three) times daily between meals. 237 mL 12  . furosemide (LASIX) 20 MG tablet Take  1 tablet (20 mg total) by mouth daily. 20 tablet 1  . HYDROcodone-acetaminophen (NORCO/VICODIN) 5-325 MG tablet Take 1 tablet by mouth every 6 (six) hours as needed for moderate pain. 30 tablet 0  . lidocaine-prilocaine (EMLA) cream Apply to affected area once 30 g 3  . Magnesium 500 MG CAPS Take 500 mg by mouth daily.    . metoprolol succinate (TOPROL-XL) 50 MG 24 hr tablet Take 1 tablet (50 mg total) by mouth daily. Take with or immediately following a meal. 90 tablet 0  . nystatin (MYCOSTATIN) 100000 UNIT/ML suspension Use as directed 5 mLs (500,000 Units total) in the mouth or throat 4  (four) times daily. Swish and spit 60 mL 0  . ondansetron (ZOFRAN) 8 MG tablet Take 1 tablet (8 mg total) by mouth every 8 (eight) hours as needed for refractory nausea / vomiting. Start on day 3 after carboplatin chemo. 30 tablet 1  . potassium chloride SA (KLOR-CON) 20 MEQ tablet TAKE 1 TABLET BY MOUTH DAILY FOR 3 DAYS (Patient taking differently: Take 20 mEq by mouth daily. ) 3 tablet 0  . sodium chloride 1 g tablet Take 1 tablet (1 g total) by mouth 3 (three) times daily with meals. 90 tablet 1   No current facility-administered medications for this visit.   Facility-Administered Medications Ordered in Other Visits  Medication Dose Route Frequency Provider Last Rate Last Admin  . heparin lock flush 100 unit/mL  500 Units Intravenous Once Graci Hulce C, MD      . sodium chloride flush (NS) 0.9 % injection 10 mL  10 mL Intravenous PRN Lequita Asal, MD   10 mL at 11/11/19 6045    Review of Systems  Review of Systems  Constitutional: Negative for chills, diaphoresis, fever, malaise/fatigue and weight loss (up 6 lbs).  HENT: Negative for congestion, ear discharge, ear pain, hearing loss, nosebleeds, sinus pain, sore throat and tinnitus.   Eyes: Negative.  Negative for blurred vision and double vision.  Respiratory: Positive for sputum production (some phlegm) and shortness of breath (on exertion). Negative for cough.   Cardiovascular: Negative.  Negative for chest pain, palpitations and leg swelling.  Gastrointestinal: Negative for abdominal pain, blood in stool, constipation, diarrhea, heartburn, melena, nausea and vomiting.       Appetite improved. Eating well. Drinking Gatorade.  Genitourinary: Negative.  Negative for dysuria, frequency, hematuria and urgency.  Musculoskeletal: Negative.  Negative for back pain, joint pain, myalgias and neck pain.  Skin: Negative for itching and rash.  Neurological: Negative for dizziness, tingling, sensory change, weakness and headaches.   Psychiatric/Behavioral: Negative for depression, memory loss and substance abuse (denies drug and alcohol use). The patient has insomnia. The patient is not nervous/anxious.   All other systems reviewed and are negative.  Performance status (ECOG): 1  Vitals Blood pressure (!) 145/89, pulse 68, temperature 97.9 F (36.6 C), temperature source Tympanic, weight 138 lb 1.9 oz (62.6 kg), SpO2 100 %.   Physical Exam Vitals and nursing note reviewed.  Constitutional:      General: He is not in acute distress.    Appearance: Normal appearance. He is not ill-appearing, toxic-appearing or diaphoretic.     Interventions: Face mask in place.  HENT:     Head: Normocephalic and atraumatic.     Comments: Albertina Parr.    Nose: No congestion.     Mouth/Throat:     Mouth: Mucous membranes are moist.     Pharynx: No oropharyngeal exudate or posterior oropharyngeal  erythema.  Eyes:     General: No scleral icterus.    Extraocular Movements: Extraocular movements intact.     Conjunctiva/sclera: Conjunctivae normal.     Pupils: Pupils are equal, round, and reactive to light.     Comments: Blue eyes.  Cardiovascular:     Rate and Rhythm: Normal rate and regular rhythm.     Heart sounds: No murmur heard.  No gallop.   Pulmonary:     Breath sounds: No wheezing, rhonchi or rales.  Abdominal:     General: There is no distension.     Palpations: Abdomen is soft. There is no hepatomegaly, splenomegaly or mass.     Tenderness: There is no abdominal tenderness. There is no guarding or rebound.  Musculoskeletal:        General: No swelling. Normal range of motion.     Cervical back: Normal range of motion.     Right lower leg: No edema.     Left lower leg: No edema.  Lymphadenopathy:     Head:     Right side of head: No preauricular, posterior auricular or occipital adenopathy.     Left side of head: No preauricular, posterior auricular or occipital adenopathy.     Cervical: No cervical  adenopathy.     Upper Body:     Right upper body: No supraclavicular or axillary adenopathy.     Left upper body: No supraclavicular or axillary adenopathy.     Lower Body: No right inguinal adenopathy. No left inguinal adenopathy.  Skin:    General: Skin is warm and dry.     Findings: No bruising, erythema or rash.  Neurological:     Mental Status: He is alert and oriented to person, place, and time.     Sensory: No sensory deficit.     Motor: No weakness.  Psychiatric:        Mood and Affect: Mood normal.        Behavior: Behavior normal.        Thought Content: Thought content normal.        Judgment: Judgment normal.     Imaging studies: 08/19/2019:  Chest CT without contrast revealed the left pleural pigtail drain. There was a small residual left hydropneumothorax. There was a 6.7 x 4 cm medial basilar left lower lobe lung masssuspicious for primary bronchogenic carcinoma. There was a lingular 1.0 cm solid pulmonary nodulesuspicious for ipsilateral metastasis. There was intralobular septal thickening and patchy groundglass opacity throughout the lingula and the left lower lobe with moderate residual atelectasis of the left lung base. A component of lymphangitic carcinomatosis was suspected. There was ipsilateral infra hilar, subcarinal, AP window, prevascular and upper retroperitoneal adenopathyc/wmetastatic disease. 08/21/2019:  Head MRI revealed no evidence of metastatic disease. 08/29/2019:  PET scan revealed markedly hypermetabolic left lower lobe pulmonary lesion (SUV 7.9) c/w the patient's known malignancy. There was associated hypermetabolic nodal metastases in the mediastinum and left hilum with metastatic involvement of left pleural space. Additional sites included upper abdominal hypermetabolic lymphadenopathy and scattered hypermetabolic bone metastases (left acetabulum, sacrum, and L5). There was interval decrease in left pleural effusion with areas of  loculation. 10/11/2019:  Chest, abdomen, and pelvis CT revealed considerable reduction in size of prior hypermetabolic lymph nodes in the chest and abdomen, compatible with response to therapy. There was notably reduced size of the left pleural effusion although some complexity or loculation persists. The previous areas of hypermetabolic nodularity along the pleural surface were not obviously nodular along  the pleural surface. There was considerable reduction in size of the previously consolidated/collapsed left lower lobe, although there was still some residual indistinct opacity in this region. The previous hypermetabolic osseous metastatic lesions were relatively occult.  There was mild airway thickening was present, suggesting bronchitis or reactive airways disease. There was trace pelvic ascites, improved from prior. There was chronic deformity of the left medial clavicle likely due to an old fracture with some deformity of the left sternoclavicular joint. 11/23/2019: Chest CT angiogram revealed no pulmonary embolus.  There was new patchy and ground-glass opacity in the dependent right lower lobe, perifissural right middle and right upper lobe. Findings were suspicious for pneumonia, including aspiration or atypical viral organisms such as COVID-19. There was a small left pleural effusion with slight increase size from prior exam. Volume loss in the left lower lobe with areas of ill-defined consolidation, was not significantly changed from prior. Right hilar adenopathy was new from prior exam, nonspecific in this setting. Recommend attention at follow-up. There was debris within the trachea and to a lesser extent right mainstem bronchus. There was right lower lobe bronchial thickening with areas of mucous plugging.  11/29/2019:  Abdomen and pelvis CT revealed residual patchy nodular foci of consolidation at the right lung base, overall decreased since 11/22/2019 favoring resolving bronchopneumonia.   Follow-up chest CT was advised.  There was a stable small dependent left pleural effusion and stable top normal size previously hypermetabolic left periaortic lymph node.  There was stable faint sclerotic left pelvic bone lesions.  There were no new or progressive metastatic disease in the abdomen or pelvis.   Infusion on 01/27/2020  Component Date Value Ref Range Status  . TSH 01/27/2020 1.066  0.350 - 4.500 uIU/mL Final   Comment: Performed by a 3rd Generation assay with a functional sensitivity of <=0.01 uIU/mL. Performed at [facility], [address], [place], [place] [address]   . Sodium 01/27/2020 132* 135 - 145 mmol/L Final  . Potassium 01/27/2020 4.0  3.5 - 5.1 mmol/L Final  . Chloride 01/27/2020 98  98 - 111 mmol/L Final  . CO2 01/27/2020 25  22 - 32 mmol/L Final  . Glucose, Bld 01/27/2020 239* 70 - 99 mg/dL Final   Glucose reference range applies only to samples taken after fasting for at least 8 hours.  . BUN 01/27/2020 11  8 - 23 mg/dL Final  . Creatinine, Ser 01/27/2020 0.84  0.61 - 1.24 mg/dL Final  . Calcium 01/27/2020 8.8* 8.9 - 10.3 mg/dL Final  . Total Protein 01/27/2020 7.2  6.5 - 8.1 g/dL Final  . Albumin 01/27/2020 3.4* 3.5 - 5.0 g/dL Final  . AST 01/27/2020 26  15 - 41 U/L Final  . ALT 01/27/2020 17  0 - 44 U/L Final  . Alkaline Phosphatase 01/27/2020 72  38 - 126 U/L Final  . Total Bilirubin 01/27/2020 1.0  0.3 - 1.2 mg/dL Final  . GFR, Estimated 01/27/2020 >60  >60 mL/min Final  . Anion gap 01/27/2020 9  5 - 15 Final   Performed at [facility], [address] [place], [place] [address]  . WBC 01/27/2020 7.5  4.0 - 10.5 K/uL Final  . RBC 01/27/2020 3.97* 4.22 - 5.81 MIL/uL Final  . Hemoglobin 01/27/2020 13.1  13.0 - 17.0 g/dL Final  . HCT 01/27/2020 38.4* 39 - 52 % Final  . MCV 01/27/2020 96.7  80.0 - 100.0 fL Final  . MCH 01/27/2020 33.0  26.0 - 34.0 pg Final  .  MCHC 01/27/2020 34.1  30.0 - 36.0 g/dL Final  . RDW 01/27/2020 13.1   11.5 - 15.5 % Final  . Platelets 01/27/2020 148* 150 - 400 K/uL Final  . nRBC 01/27/2020 0.0  0.0 - 0.2 % Final  . Neutrophils Relative % 01/27/2020 80  % Final  . Neutro Abs 01/27/2020 6.1  1.7 - 7.7 K/uL Final  . Lymphocytes Relative 01/27/2020 9  % Final  . Lymphs Abs 01/27/2020 0.6* 0.7 - 4.0 K/uL Final  . Monocytes Relative 01/27/2020 8  % Final  . Monocytes Absolute 01/27/2020 0.6  0.1 - 1.0 K/uL Final  . Eosinophils Relative 01/27/2020 2  % Final  . Eosinophils Absolute 01/27/2020 0.2  0.0 - 0.5 K/uL Final  . Basophils Relative 01/27/2020 0  % Final  . Basophils Absolute 01/27/2020 0.0  0.0 - 0.1 K/uL Final  . Immature Granulocytes 01/27/2020 1  % Final  . Abs Immature Granulocytes 01/27/2020 0.05  0.00 - 0.07 K/uL Final   Performed at [facility], [address], [place], [place] [address]  . Magnesium 01/27/2020 1.4* 1.7 - 2.4 mg/dL Final   Performed at [facility] [address] [place], [place] [address]    Assessment:  Scott Jordan is a 69 y.o. male with extensive stage small cell lung cancer. He has a >40 pack year smoking. He presented with a recurrent left sided pleural effusion. He has undergone thoracentesis x 2 in the past month. He is s/p pigtail drain. Initial cytology x 2 was negative (lymphocytic exudative effusion).Pleural fluid cytologyon 08/18/2019 confirmed small cell lung cancer.  Chest CT without contraston 08/19/2019 revealed the left pleural pigtail drain. There was a small residual left hydropneumothorax. There was a 6.7 x 4 cm medial basilar left lower lobe lung masssuspicious for primary bronchogenic carcinoma. There was a lingular 1.0 cm solid pulmonary nodulesuspicious for ipsilateral metastasis. There was intralobular septal thickening and patchy groundglass opacity throughout the lingula and the left lower lobe with moderate residual atelectasis of the left lung base. A component of lymphangitic carcinomatosis  was suspected. There was ipsilateral infra hilar, subcarinal, AP window, prevascular and upper retroperitoneal adenopathyc/wmetastatic disease.  Head MRI on 08/21/2019 revealed no evidence of metastatic disease.  PET scan on 05/20/20201 revealed markedly hypermetabolic left lower lobe pulmonary lesion (SUV 7.9) c/w the patient's known malignancy. There was associated hypermetabolic nodal metastases in the mediastinum and left hilum with metastatic involvement of left pleural space. Additional sites included upper abdominal hypermetabolic lymphadenopathy and scattered hypermetabolic bone metastases (left acetabulum, sacrum, and L5). There was interval decrease in left pleural effusion with areas of loculation.  CEA was 3.7 on 09/02/2019.  LDH was 206 on 09/23/2019.  He is s/p 4 cycles of carboplatin, etoposide, and Tecentriq (09/02/2019- 11/11/2019) with Margarette Canada support.  He is s/p 2 cycles of maintenance Tecentriq (12/02/2019 - 01/06/2020).  He received PCI from 01/09/2020 - 01/22/2020.   Chest CT angiogram on 11/23/2019 revealed no pulmonary embolus.  There was new patchy and ground-glass opacity in the dependent right lower lobe, perifissural right middle and right upper lobe. Findings were suspicious for pneumonia, including aspiration or atypical viral organisms such as COVID-19. There was a small left pleural effusion with slight increase size from prior exam. Volume loss in the left lower lobe with areas of ill-defined consolidation, was not significantly changed from prior. Right hilar adenopathy was new from prior exam, nonspecific in this setting. Recommend attention at follow-up. There was debris within the  trachea and to a lesser extent right mainstem bronchus. There was right lower lobe bronchial thickening with areas of mucous plugging.   Abdomen and pelvis CT on 11/29/2019 revealed residual patchy nodular foci of consolidation at the right lung base, overall decreased since 11/22/2019  favoring resolving bronchopneumonia.  Follow-up chest CT was advised.  There was a stable small dependent left pleural effusion and stable top normal size previously hypermetabolic left periaortic lymph node.  There was stable faint sclerotic left pelvic bone lesions.  There were no new or progressive metastatic disease in the abdomen or pelvis.  He was admitted to [facility] from 11/22/2019 - 11/25/2019 with a heroin overdose and aspiration pneumonia.  He received 1 unit of PRBCs.  He was treated with Zosyn.  He was discharged on Augmentin x5 days.  He has hyponatremiasecondary to SIADH.  He began sodium chloride tablets (1 gm TID) on 09/04/2019.  He has elevated LFTs. Hepatitis C antibody and hepatitis B core antibody total were positive on 12/23/2019.  Hepatitis B surface antigen, hepatitis B core IgM and hepatitis A IgM were negative.  Hepatitis B DNA was not detected. Hepatitis C RNA quantitative revealed 3.869 log 10 IU/mL (7400 IU/mL).  Hepatitis C genotype is 2b.  He has a significant family history of malignancy. A family member has had genetic testing (negative for Lynch syndrome).  Symptomatically, he has felt good. His appetite has improved. He has been eating well and drinking Gatorade. He is still a little bit short of breath and coughs up phlegm.  Exam is stable.  Plan: 1.   Labs today: CBC with diff, CMP, Mg, TSH 2. Metastatic small cell lung cancer Clinically, he is doing well. He is s/p 4 cycles of carboplatin, etoposide and Tecentriq (last 11/11/2019). Chest CT angiogram plus abdomen and pelvis CT in 08/2021revealed adramatic response to treatment. He completed PCI on 01/22/2020.  He iss/p2 cycles of maintenance Tecentriq (last 01/06/2020). Cycle #2 maintenance Tecentriq was held secondary to increased LFTs on 12/30/2019. No evidence for Tecentriq induced hepatitis.   Etiology felt secondary  to possible alcohol or less likely hepatitis C.  Labs reviewed.  Cycle #3 maintenance Tecentriq today. Discuss symptom management.  He has antiemetics at home to use on a prn bases.  Interventions are adequate.   . 3. Hyponatremia Sodium132.  Continue salt tablets twice daily.  Continue fluid restriction. Etiology secondary to SIADH. 4. History of atrial fibrillation Continue Eliquis.  Patient denies any bleeding. 5. Chemotherapy induced anemia, resolved Hematocrit 38.4.  Hemoglobin 13.1.  MCV 96.7 today. CBC has improved off chemotherapy. 6. Elevated LFTs AST 20. ALT 12. Bilirubin 0.6. Alkaline phosphatase   70 on 12/02/2019. AST 134. ALT 132. Bilirubin 0.3. Alkaline phosphatase   85 on 12/23/2019.             AST   34.  ALT   49.  Bilirubin 1.0.  Alkaline phosphatase 100 on 12/30/2019.  AST   24.  ALT   22.  Bilirubin 0.7.  Alkaline phosphatase   83 on 01/06/2020.  AST   26.  ALT   17.  Bilirubin 1.0.  Alkaline phosphatase 72 on 01/27/2020 Etiology felt secondary to alcohol versus hepatitis C He has a history of untreated hepatitis C.             Plan for follow-up with Dr. Allen Norris on 02/26/2020. 7.   Hypomagnesemia  Magnesium 2 gm IV today. 8.   Cycle #3 Tecentriq. 9.   RTC in 3 weeks  for MD assessment, labs (CBC with diff, CMP, Mg, CEA, TSH), and cycle #4 Tecentriq.  I discussed the assessment and treatment plan with the patient.  The patient was provided an opportunity to ask questions and all were answered.  The patient agreed with the plan and demonstrated an understanding of the instructions.  The patient was advised to call back if the symptoms worsen or if the condition fails to improve as anticipated.   Lequita Asal, MD, PhD    01/27/2020, 5:47 PM  I, Mirian Mo Tufford, am acting as Education administrator for [employer]. Mike Gip, MD, PhD.  I, Emilliano Dilworth C. Mike Gip, MD, have reviewed the above documentation for accuracy and completeness, and I agree with the above.

## 2020-01-27 ENCOUNTER — Other Ambulatory Visit: Payer: Self-pay

## 2020-01-27 ENCOUNTER — Inpatient Hospital Stay: Payer: Medicare Other

## 2020-01-27 ENCOUNTER — Encounter: Payer: Self-pay | Admitting: Hematology and Oncology

## 2020-01-27 ENCOUNTER — Inpatient Hospital Stay (HOSPITAL_BASED_OUTPATIENT_CLINIC_OR_DEPARTMENT_OTHER): Payer: Medicare Other | Admitting: Hematology and Oncology

## 2020-01-27 VITALS — BP 146/90 | HR 68

## 2020-01-27 VITALS — BP 145/89 | HR 68 | Temp 97.9°F | Wt 138.1 lb

## 2020-01-27 DIAGNOSIS — C7951 Secondary malignant neoplasm of bone: Secondary | ICD-10-CM

## 2020-01-27 DIAGNOSIS — D6481 Anemia due to antineoplastic chemotherapy: Secondary | ICD-10-CM

## 2020-01-27 DIAGNOSIS — Z5112 Encounter for antineoplastic immunotherapy: Secondary | ICD-10-CM | POA: Diagnosis not present

## 2020-01-27 DIAGNOSIS — C3490 Malignant neoplasm of unspecified part of unspecified bronchus or lung: Secondary | ICD-10-CM

## 2020-01-27 DIAGNOSIS — Z7901 Long term (current) use of anticoagulants: Secondary | ICD-10-CM

## 2020-01-27 DIAGNOSIS — I4891 Unspecified atrial fibrillation: Secondary | ICD-10-CM | POA: Diagnosis not present

## 2020-01-27 DIAGNOSIS — E871 Hypo-osmolality and hyponatremia: Secondary | ICD-10-CM | POA: Diagnosis not present

## 2020-01-27 DIAGNOSIS — R7989 Other specified abnormal findings of blood chemistry: Secondary | ICD-10-CM

## 2020-01-27 DIAGNOSIS — C3412 Malignant neoplasm of upper lobe, left bronchus or lung: Secondary | ICD-10-CM | POA: Diagnosis not present

## 2020-01-27 LAB — CBC WITH DIFFERENTIAL/PLATELET
Abs Immature Granulocytes: 0.05 10*3/uL (ref 0.00–0.07)
Basophils Absolute: 0 10*3/uL (ref 0.0–0.1)
Basophils Relative: 0 %
Eosinophils Absolute: 0.2 10*3/uL (ref 0.0–0.5)
Eosinophils Relative: 2 %
HCT: 38.4 % — ABNORMAL LOW (ref 39.0–52.0)
Hemoglobin: 13.1 g/dL (ref 13.0–17.0)
Immature Granulocytes: 1 %
Lymphocytes Relative: 9 %
Lymphs Abs: 0.6 10*3/uL — ABNORMAL LOW (ref 0.7–4.0)
MCH: 33 pg (ref 26.0–34.0)
MCHC: 34.1 g/dL (ref 30.0–36.0)
MCV: 96.7 fL (ref 80.0–100.0)
Monocytes Absolute: 0.6 10*3/uL (ref 0.1–1.0)
Monocytes Relative: 8 %
Neutro Abs: 6.1 10*3/uL (ref 1.7–7.7)
Neutrophils Relative %: 80 %
Platelets: 148 10*3/uL — ABNORMAL LOW (ref 150–400)
RBC: 3.97 MIL/uL — ABNORMAL LOW (ref 4.22–5.81)
RDW: 13.1 % (ref 11.5–15.5)
WBC: 7.5 10*3/uL (ref 4.0–10.5)
nRBC: 0 % (ref 0.0–0.2)

## 2020-01-27 LAB — COMPREHENSIVE METABOLIC PANEL
ALT: 17 U/L (ref 0–44)
AST: 26 U/L (ref 15–41)
Albumin: 3.4 g/dL — ABNORMAL LOW (ref 3.5–5.0)
Alkaline Phosphatase: 72 U/L (ref 38–126)
Anion gap: 9 (ref 5–15)
BUN: 11 mg/dL (ref 8–23)
CO2: 25 mmol/L (ref 22–32)
Calcium: 8.8 mg/dL — ABNORMAL LOW (ref 8.9–10.3)
Chloride: 98 mmol/L (ref 98–111)
Creatinine, Ser: 0.84 mg/dL (ref 0.61–1.24)
GFR, Estimated: >60 mL/min (ref >60)
Glucose, Bld: 239 mg/dL — ABNORMAL HIGH (ref 70–99)
Potassium: 4 mmol/L (ref 3.5–5.1)
Sodium: 132 mmol/L — ABNORMAL LOW (ref 135–145)
Total Bilirubin: 1 mg/dL (ref 0.3–1.2)
Total Protein: 7.2 g/dL (ref 6.5–8.1)

## 2020-01-27 LAB — MAGNESIUM: Magnesium: 1.4 mg/dL — ABNORMAL LOW (ref 1.7–2.4)

## 2020-01-27 LAB — TSH: TSH: 1.066 u[IU]/mL (ref 0.350–4.500)

## 2020-01-27 MED ORDER — MAGNESIUM SULFATE 2 GM/50ML IV SOLN
2.0000 g | Freq: Once | INTRAVENOUS | Status: AC
  Administered 2020-01-27: 2 g via INTRAVENOUS
  Filled 2020-01-27: qty 50

## 2020-01-27 MED ORDER — HEPARIN SOD (PORK) LOCK FLUSH 100 UNIT/ML IV SOLN
500.0000 [IU] | Freq: Once | INTRAVENOUS | Status: AC | PRN
  Administered 2020-01-27: 500 [IU]
  Filled 2020-01-27: qty 5

## 2020-01-27 MED ORDER — SODIUM CHLORIDE 0.9 % IV SOLN
1200.0000 mg | Freq: Once | INTRAVENOUS | Status: AC
  Administered 2020-01-27: 1200 mg via INTRAVENOUS
  Filled 2020-01-27: qty 20

## 2020-01-27 MED ORDER — SODIUM CHLORIDE 0.9 % IV SOLN
Freq: Once | INTRAVENOUS | Status: AC
  Filled 2020-01-27: qty 250

## 2020-01-27 NOTE — Progress Notes (Signed)
No new changes noted today 

## 2020-01-29 ENCOUNTER — Other Ambulatory Visit: Payer: Self-pay

## 2020-01-29 ENCOUNTER — Other Ambulatory Visit: Payer: Self-pay | Admitting: *Deleted

## 2020-01-29 DIAGNOSIS — E871 Hypo-osmolality and hyponatremia: Secondary | ICD-10-CM

## 2020-01-29 MED ORDER — SODIUM CHLORIDE 1 G PO TABS: 1.0000 g | ORAL_TABLET | Freq: Three times a day (TID) | ORAL | 1 refills | Status: DC

## 2020-02-13 NOTE — Progress Notes (Signed)
[facility]  [address] [place], [place] [address] Phone: [phone]  Fax: [phone]   Clinic Day:  02/17/20  Referring physician: No ref. provider found  Chief Complaint: Scott Jordan is a 69 y.o. male with extensive stage small cell lung cancer who is seen for assessment prior to cycle #4 maintenance Tecentriq.  HPI: The patient was last seen in the medical oncology clinic on 01/27/2020. At that time, he felt good. His appetite had improved. He had been eating well and drinking Gatorade. He was still a little bit short of breath and coughed up phlegm.  Exam was stable. Hematocrit was 38.4, hemoglobin 13.1, platelets 148,000, WBC 7,500. Sodium was 132. Calcium was 8.8 and albumin 3.4. TSH was 1.066. Magnesium was 1.4. He received cycle #3 maintenance Tecentriq and 2 g IV magnesium.  During the interim, he has been "alright."  He reports sinus headaches and shortness of breath. He is coughing up white phlegm and has been taking Mucinex. He does not sleep well. He denies any other symptoms or complaints. He is eating well. Denies nausea, vomiting, and diarrhea. He denies any recent drug or alcohol use.   Past Medical History:  Diagnosis Date  . A-fib ([place]) 01/10/2019   pt st this was dx by Dr. Ubaldo Glassing  . Cancer ([place]) 2021   LUNG  . Complication of anesthesia    DIFFICULTY WAKING UP AFTER SURGERY- 20 YRS AGO  . Hypertension   . Lung cancer ([employer])   . Substance abuse [facility])     Past Surgical History:  Procedure Laterality Date  . ARM DEBRIDEMENT Left    INCISION AND DEBRIDEMENT LOWER ARM -20 YRS AGO  . BACK SURGERY    . PORTACATH PLACEMENT Left 08/29/2019   Procedure: INSERTION PORT-A-CATH;  Surgeon: Nestor Lewandowsky, MD;  Location: [facility] ORS;  Service: General;  Laterality: Left;    History reviewed. No pertinent family history.  Social History:  reports that he has quit smoking. His smoking use included cigarettes. He smoked 0.50 packs per  day. He has never used smokeless tobacco. He reports current alcohol use. He reports previous drug use. Drug: Heroin. The patient denies any exposure to radiation or toxins. The patient lives in [place]. He is dependent on public transportation or transportation from friends/family. He has family friends staying with him to help care for his needs.His niece, Scott Jordan [phone]), is his medical power of attorney. The patient is alone today.    Allergies: No Known Allergies  Current Medications: Current Outpatient Medications  Medication Sig Dispense Refill  . calcium carbonate (CALCIUM 600) 600 MG TABS tablet Take 600 mg by mouth daily with breakfast.    . dexamethasone (DECADRON) 4 MG tablet Take 1 tablet (4 mg total) by mouth daily. 25 tablet 0  . diltiazem (CARDIZEM CD) 240 MG 24 hr capsule Take 1 capsule (240 mg total) by mouth daily. 90 capsule 0  . ELIQUIS 5 MG TABS tablet Take 5 mg by mouth 2 (two) times daily.    Marland Kitchen escitalopram (LEXAPRO) 10 MG tablet Take 1 tablet (10 mg total) by mouth daily. 90 tablet 0  . feeding supplement, ENSURE ENLIVE, (ENSURE ENLIVE) LIQD Take 237 mLs by mouth 3 (three) times daily between meals. 237 mL 12  . furosemide (LASIX) 20 MG tablet Take 1 tablet (20 mg total) by mouth daily. 20 tablet 1  . HYDROcodone-acetaminophen (NORCO/VICODIN) 5-325 MG tablet Take 1 tablet by mouth every 6 (six) hours as needed  for moderate pain. 30 tablet 0  . lidocaine-prilocaine (EMLA) cream Apply to affected area once 30 g 3  . Magnesium 500 MG CAPS Take 500 mg by mouth daily.    . metoprolol succinate (TOPROL-XL) 50 MG 24 hr tablet Take 1 tablet (50 mg total) by mouth daily. Take with or immediately following a meal. 90 tablet 0  . nystatin (MYCOSTATIN) 100000 UNIT/ML suspension Use as directed 5 mLs (500,000 Units total) in the mouth or throat 4 (four) times daily. Swish and spit 60 mL 0  . ondansetron (ZOFRAN) 8 MG tablet Take 1 tablet (8 mg total) by mouth every 8  (eight) hours as needed for refractory nausea / vomiting. Start on day 3 after carboplatin chemo. 30 tablet 1  . potassium chloride SA (KLOR-CON) 20 MEQ tablet TAKE 1 TABLET BY MOUTH DAILY FOR 3 DAYS (Patient taking differently: Take 20 mEq by mouth daily. ) 3 tablet 0  . sodium chloride 1 g tablet Take 1 tablet (1 g total) by mouth 3 (three) times daily with meals. 90 tablet 1   No current facility-administered medications for this visit.   Facility-Administered Medications Ordered in Other Visits  Medication Dose Route Frequency Provider Last Rate Last Admin  . heparin lock flush 100 unit/mL  500 Units Intravenous Once Tkeyah Burkman C, MD      . sodium chloride flush (NS) 0.9 % injection 10 mL  10 mL Intravenous PRN Lequita Asal, MD   10 mL at 11/11/19 2563    Review of Systems  Review of Systems  Constitutional: Positive for weight loss (3 lbs). Negative for chills, diaphoresis, fever and malaise/fatigue.  HENT: Negative for congestion, ear discharge, ear pain, hearing loss, nosebleeds, sinus pain, sore throat and tinnitus.   Eyes: Negative.  Negative for blurred vision and double vision.  Respiratory: Positive for sputum production (some phlegm) and shortness of breath (on exertion). Negative for cough.   Cardiovascular: Negative.  Negative for chest pain, palpitations and leg swelling.  Gastrointestinal: Negative for abdominal pain, blood in stool, constipation, diarrhea, heartburn, melena, nausea and vomiting.       Good appetite.  Genitourinary: Negative.  Negative for dysuria, frequency, hematuria and urgency.  Musculoskeletal: Negative.  Negative for back pain, joint pain, myalgias and neck pain.  Skin: Negative for itching and rash.  Neurological: Positive for headaches. Negative for dizziness, tingling, sensory change and weakness.  Psychiatric/Behavioral: Negative for depression, memory loss and substance abuse (denies drug and alcohol use). The patient has insomnia.  The patient is not nervous/anxious.   All other systems reviewed and are negative.  Performance status (ECOG): 1  Vitals Blood pressure (!) 144/89, pulse 60, temperature 98.8 F (37.1 C), temperature source Tympanic, resp. rate 18, height 6\' 2"  (1.88 m), weight 135 lb 0.5 oz (61.3 kg), SpO2 98 %.   Physical Exam Vitals and nursing note reviewed.  Constitutional:      General: He is not in acute distress.    Appearance: Normal appearance. He is not ill-appearing, toxic-appearing or diaphoretic.     Interventions: Face mask in place.  HENT:     Head: Normocephalic and atraumatic.     Comments: Albertina Parr.    Mouth/Throat:     Mouth: Mucous membranes are moist.     Pharynx: No oropharyngeal exudate or posterior oropharyngeal erythema.  Eyes:     General: No scleral icterus.    Extraocular Movements: Extraocular movements intact.     Conjunctiva/sclera: Conjunctivae normal.  Pupils: Pupils are equal, round, and reactive to light.     Comments: Blue eyes.  Cardiovascular:     Rate and Rhythm: Normal rate and regular rhythm.     Heart sounds: No murmur heard.  No gallop.   Pulmonary:     Breath sounds: No wheezing, rhonchi or rales.  Abdominal:     General: There is no distension.     Palpations: Abdomen is soft. There is no hepatomegaly, splenomegaly or mass.     Tenderness: There is no abdominal tenderness. There is no guarding or rebound.  Musculoskeletal:        General: No swelling. Normal range of motion.     Cervical back: Normal range of motion.     Right lower leg: No edema.     Left lower leg: No edema.  Lymphadenopathy:     Head:     Right side of head: No preauricular, posterior auricular or occipital adenopathy.     Left side of head: No preauricular, posterior auricular or occipital adenopathy.     Cervical: No cervical adenopathy.     Upper Body:     Right upper body: No supraclavicular or axillary adenopathy.     Left upper body: No supraclavicular or  axillary adenopathy.     Lower Body: No right inguinal adenopathy. No left inguinal adenopathy.  Skin:    General: Skin is warm and dry.     Findings: No bruising, erythema or rash.  Neurological:     Mental Status: He is alert and oriented to person, place, and time.     Sensory: No sensory deficit.     Motor: No weakness.  Psychiatric:        Mood and Affect: Mood normal.        Behavior: Behavior normal.        Thought Content: Thought content normal.        Judgment: Judgment normal.     Imaging studies: 08/19/2019:  Chest CT without contrast revealed the left pleural pigtail drain. There was a small residual left hydropneumothorax. There was a 6.7 x 4 cm medial basilar left lower lobe lung masssuspicious for primary bronchogenic carcinoma. There was a lingular 1.0 cm solid pulmonary nodulesuspicious for ipsilateral metastasis. There was intralobular septal thickening and patchy groundglass opacity throughout the lingula and the left lower lobe with moderate residual atelectasis of the left lung base. A component of lymphangitic carcinomatosis was suspected. There was ipsilateral infra hilar, subcarinal, AP window, prevascular and upper retroperitoneal adenopathyc/wmetastatic disease. 08/21/2019:  Head MRI revealed no evidence of metastatic disease. 08/29/2019:  PET scan revealed markedly hypermetabolic left lower lobe pulmonary lesion (SUV 7.9) c/w the patient's known malignancy. There was associated hypermetabolic nodal metastases in the mediastinum and left hilum with metastatic involvement of left pleural space. Additional sites included upper abdominal hypermetabolic lymphadenopathy and scattered hypermetabolic bone metastases (left acetabulum, sacrum, and L5). There was interval decrease in left pleural effusion with areas of loculation. 10/11/2019:  Chest, abdomen, and pelvis CT revealed considerable reduction in size of prior hypermetabolic lymph nodes in the chest and  abdomen, compatible with response to therapy. There was notably reduced size of the left pleural effusion although some complexity or loculation persists. The previous areas of hypermetabolic nodularity along the pleural surface were not obviously nodular along the pleural surface. There was considerable reduction in size of the previously consolidated/collapsed left lower lobe, although there was still some residual indistinct opacity in this region. The previous hypermetabolic  osseous metastatic lesions were relatively occult.  There was mild airway thickening was present, suggesting bronchitis or reactive airways disease. There was trace pelvic ascites, improved from prior. There was chronic deformity of the left medial clavicle likely due to an old fracture with some deformity of the left sternoclavicular joint. 11/23/2019: Chest CT angiogram revealed no pulmonary embolus.  There was new patchy and ground-glass opacity in the dependent right lower lobe, perifissural right middle and right upper lobe. Findings were suspicious for pneumonia, including aspiration or atypical viral organisms such as COVID-19. There was a small left pleural effusion with slight increase size from prior exam. Volume loss in the left lower lobe with areas of ill-defined consolidation, was not significantly changed from prior. Right hilar adenopathy was new from prior exam, nonspecific in this setting. Recommend attention at follow-up. There was debris within the trachea and to a lesser extent right mainstem bronchus. There was right lower lobe bronchial thickening with areas of mucous plugging.  11/29/2019:  Abdomen and pelvis CT revealed residual patchy nodular foci of consolidation at the right lung base, overall decreased since 11/22/2019 favoring resolving bronchopneumonia.  Follow-up chest CT was advised.  There was a stable small dependent left pleural effusion and stable top normal size previously hypermetabolic left  periaortic lymph node.  There was stable faint sclerotic left pelvic bone lesions.  There were no new or progressive metastatic disease in the abdomen or pelvis.   Infusion on 02/17/2020  Component Date Value Ref Range Status  . T4, Total 02/17/2020 6.7  4.5 - 12.0 ug/dL Final   Comment: (NOTE) Performed At: [facility] [place], [place] [id number] Rush Farmer MD [id number]   . TSH 02/17/2020 1.621  0.350 - 4.500 uIU/mL Final   Comment: Performed by a 3rd Generation assay with a functional sensitivity of <=0.01 uIU/mL. Performed at [facility], [address], [place], [place] [address]   . CEA 02/17/2020 3.8  0.0 - 4.7 ng/mL Final   Comment: (NOTE)                             Nonsmokers          <3.9                             Smokers             <5.6 Roche [employer] Electrochemiluminescence Immunoassay (ECLIA) Values obtained with different assay methods or kits cannot be used interchangeably.  Results cannot be interpreted as absolute evidence of the presence or absence of malignant disease. Performed At: [facility] [place], [place] [id number] Rush Farmer MD [id number]   . Magnesium 02/17/2020 1.6* 1.7 - 2.4 mg/dL Final   Performed at [facility], [address], [place], [place] [address]  . Sodium 02/17/2020 131* 135 - 145 mmol/L Final  . Potassium 02/17/2020 3.8  3.5 - 5.1 mmol/L Final  . Chloride 02/17/2020 98  98 - 111 mmol/L Final  . CO2 02/17/2020 26  22 - 32 mmol/L Final  . Glucose, Bld 02/17/2020 143* 70 - 99 mg/dL Final   Glucose reference range applies only to samples taken after fasting for at least 8 hours.  . BUN 02/17/2020 11  8 - 23 mg/dL Final  . Creatinine, Ser 02/17/2020 0.70  0.61 - 1.24 mg/dL Final  . Calcium 02/17/2020 8.8* 8.9 -  10.3 mg/dL Final  . Total Protein 02/17/2020 7.2  6.5 - 8.1 g/dL Final  . Albumin 02/17/2020 3.5  3.5 - 5.0 g/dL Final  . AST 02/17/2020  19  15 - 41 U/L Final  . ALT 02/17/2020 16  0 - 44 U/L Final  . Alkaline Phosphatase 02/17/2020 72  38 - 126 U/L Final  . Total Bilirubin 02/17/2020 0.5  0.3 - 1.2 mg/dL Final  . GFR, Estimated 02/17/2020 >60  >60 mL/min Final   Comment: (NOTE) Calculated using the CKD-EPI Creatinine Equation (2021)   . Anion gap 02/17/2020 7  5 - 15 Final   Performed at [facility], [address], [place], [place] [address]  . WBC 02/17/2020 6.1  4.0 - 10.5 K/uL Final  . RBC 02/17/2020 4.03* 4.22 - 5.81 MIL/uL Final  . Hemoglobin 02/17/2020 13.5  13.0 - 17.0 g/dL Final  . HCT 02/17/2020 38.3* 39 - 52 % Final  . MCV 02/17/2020 95.0  80.0 - 100.0 fL Final  . MCH 02/17/2020 33.5  26.0 - 34.0 pg Final  . MCHC 02/17/2020 35.2  30.0 - 36.0 g/dL Final  . RDW 02/17/2020 12.6  11.5 - 15.5 % Final  . Platelets 02/17/2020 218  150 - 400 K/uL Final  . nRBC 02/17/2020 0.0  0.0 - 0.2 % Final  . Neutrophils Relative % 02/17/2020 71  % Final  . Neutro Abs 02/17/2020 4.3  1.7 - 7.7 K/uL Final  . Lymphocytes Relative 02/17/2020 16  % Final  . Lymphs Abs 02/17/2020 1.0  0.7 - 4.0 K/uL Final  . Monocytes Relative 02/17/2020 11  % Final  . Monocytes Absolute 02/17/2020 0.6  0.1 - 1.0 K/uL Final  . Eosinophils Relative 02/17/2020 0  % Final  . Eosinophils Absolute 02/17/2020 0.0  0.0 - 0.5 K/uL Final  . Basophils Relative 02/17/2020 0  % Final  . Basophils Absolute 02/17/2020 0.0  0.0 - 0.1 K/uL Final  . Immature Granulocytes 02/17/2020 2  % Final  . Abs Immature Granulocytes 02/17/2020 0.10* 0.00 - 0.07 K/uL Final   Performed at [facility], [address] [place], [place] [address]    Assessment:  MAKSYM PFIFFNER is a 69 y.o. male with extensive stage small cell lung cancer. He has a >40 pack year smoking. He presented with a recurrent left sided pleural effusion. He has undergone thoracentesis x 2 in the past month. He is s/p pigtail drain. Initial cytology x 2 was negative  (lymphocytic exudative effusion).Pleural fluid cytologyon 08/18/2019 confirmed small cell lung cancer.  Chest CT without contraston 08/19/2019 revealed the left pleural pigtail drain. There was a small residual left hydropneumothorax. There was a 6.7 x 4 cm medial basilar left lower lobe lung masssuspicious for primary bronchogenic carcinoma. There was a lingular 1.0 cm solid pulmonary nodulesuspicious for ipsilateral metastasis. There was intralobular septal thickening and patchy groundglass opacity throughout the lingula and the left lower lobe with moderate residual atelectasis of the left lung base. A component of lymphangitic carcinomatosis was suspected. There was ipsilateral infra hilar, subcarinal, AP window, prevascular and upper retroperitoneal adenopathyc/wmetastatic disease.  Head MRI on 08/21/2019 revealed no evidence of metastatic disease.  PET scan on 05/20/20201 revealed markedly hypermetabolic left lower lobe pulmonary lesion (SUV 7.9) c/w the patient's known malignancy. There was associated hypermetabolic nodal metastases in the mediastinum and left hilum with metastatic involvement of left pleural space. Additional sites included upper abdominal hypermetabolic lymphadenopathy and scattered hypermetabolic bone metastases (left acetabulum,  sacrum, and L5). There was interval decrease in left pleural effusion with areas of loculation.  CEA was 3.7 on 09/02/2019.  LDH was 206 on 09/23/2019.  He is s/p 4 cycles of carboplatin, etoposide, and Tecentriq (09/02/2019- 11/11/2019) with Margarette [place] support.  He is s/p 3 cycles of maintenance Tecentriq (12/02/2019 - 01/27/2020).  He received PCI from 01/09/2020 - 01/22/2020.   Chest CT angiogram on 11/23/2019 revealed no pulmonary embolus.  There was new patchy and ground-glass opacity in the dependent right lower lobe, perifissural right middle and right upper lobe. Findings were suspicious for pneumonia, including aspiration or  atypical viral organisms such as COVID-19. There was a small left pleural effusion with slight increase size from prior exam. Volume loss in the left lower lobe with areas of ill-defined consolidation, was not significantly changed from prior. Right hilar adenopathy was new from prior exam, nonspecific in this setting. Recommend attention at follow-up. There was debris within the trachea and to a lesser extent right mainstem bronchus. There was right lower lobe bronchial thickening with areas of mucous plugging.   Abdomen and pelvis CT on 11/29/2019 revealed residual patchy nodular foci of consolidation at the right lung base, overall decreased since 11/22/2019 favoring resolving bronchopneumonia.  Follow-up chest CT was advised.  There was a stable small dependent left pleural effusion and stable top normal size previously hypermetabolic left periaortic lymph node.  There was stable faint sclerotic left pelvic bone lesions.  There were no new or progressive metastatic disease in the abdomen or pelvis.  He was admitted to [facility] from 11/22/2019 - 11/25/2019 with a heroin overdose and aspiration pneumonia.  He received 1 unit of PRBCs.  He was treated with Zosyn.  He was discharged on Augmentin x5 days.  He has hyponatremiasecondary to SIADH.  He began sodium chloride tablets (1 gm TID) on 09/04/2019.  He has elevated LFTs. Hepatitis C antibody and hepatitis B core antibody total were positive on 12/23/2019.  Hepatitis B surface antigen, hepatitis B core IgM and hepatitis A IgM were negative.  Hepatitis B DNA was not detected. Hepatitis C RNA quantitative revealed 3.869 log 10 IU/mL (7400 IU/mL).  Hepatitis C genotype is 2b.  He has a significant family history of malignancy. A family member has had genetic testing (negative for Lynch syndrome).  Symptomatically, he has felt "alright."  He has chronic shortness of breath. He is eating well.  He denies any recent drug or alcohol use.  Exam is  stable.  Plan: 1.   Labs today: CBC with diff, CMP, Mg, CEA, TSH.  2. Metastatic small cell lung cancer Clinically, he is doing well. He is s/p 4 cycles of carboplatin, etoposide and Tecentriq (last 11/11/2019). Chest CT angiogram plus abdomen and pelvis CT in 08/2021revealed adramatic response to treatment. He completed PCI on 01/22/2020.  He iss/p3 cycles of maintenance Tecentriq (last 01/27/2020). Cycle #2 maintenance Tecentriq was held secondary to increased LFTs on 12/30/2019. No evidence for Tecentriq induced hepatitis.   Etiology felt secondary to possible alcohol or less likely hepatitis C.  Labs reviewed.  Cycle #4 maintenance Tecentriq today. Discuss symptom management.  He has antiemetics at home to use on a prn bases.  Interventions are adequate.    3. Hyponatremia Sodium131.  Patient remains on fluid restriction and salt tablets twice daily.  Etiology felt secondary to SIADH.  Continue to monitor. 4. History of atrial fibrillation Continue Eliquis.  Patient denies any bleeding. 5. Elevated LFTs, resolved AST 19. ALT 16. Bilirubin 0.5. Alkaline  phosphatase  72 today. Etiology felt secondary to alcohol versus hepatitis C. He has a history of untreated hepatitis C.             Patient has an appointment with Dr. Allen Norris on 02/26/2020 6.   Hypomagnesemia  Magnesium 1.6 today. 7.   Weight loss  Discuss importance of caloric intake/  Ensure or Boost samples today. 8.   Cycle # 4 Tecentriq today. 9.   RTC in 3 weeks for MD assessment, labs (CBC with diff, CMP, Mg, TSH), and cycle #5 Tecentriq.  I discussed the assessment and treatment plan with the patient.  The patient was provided an opportunity to ask questions and all were answered.  The patient agreed with the plan  and demonstrated an understanding of the instructions.  The patient was advised to call back if the symptoms worsen or if the condition fails to improve as anticipated.   Lequita Asal, MD, PhD    02/17/2020, 5:48 PM  I, Mirian Mo Tufford, am acting as Education administrator for [employer]. Mike Gip, MD, PhD.  I, Rie Mcneil C. Mike Gip, MD, have reviewed the above documentation for accuracy and completeness, and I agree with the above.

## 2020-02-17 ENCOUNTER — Ambulatory Visit: Payer: Medicare Other

## 2020-02-17 ENCOUNTER — Other Ambulatory Visit: Payer: Self-pay | Admitting: Hematology and Oncology

## 2020-02-17 ENCOUNTER — Ambulatory Visit: Payer: Medicare Other | Admitting: Hematology and Oncology

## 2020-02-17 ENCOUNTER — Inpatient Hospital Stay: Payer: Medicare Other

## 2020-02-17 ENCOUNTER — Inpatient Hospital Stay: Payer: Medicare Other | Attending: Hematology and Oncology | Admitting: Hematology and Oncology

## 2020-02-17 ENCOUNTER — Other Ambulatory Visit: Payer: Medicare Other

## 2020-02-17 ENCOUNTER — Encounter: Payer: Self-pay | Admitting: Hematology and Oncology

## 2020-02-17 ENCOUNTER — Other Ambulatory Visit: Payer: Self-pay

## 2020-02-17 VITALS — BP 144/89 | HR 60 | Temp 98.8°F | Resp 18 | Ht 74.0 in | Wt 135.0 lb

## 2020-02-17 VITALS — BP 124/91 | HR 70 | Resp 18

## 2020-02-17 DIAGNOSIS — C3412 Malignant neoplasm of upper lobe, left bronchus or lung: Secondary | ICD-10-CM | POA: Diagnosis not present

## 2020-02-17 DIAGNOSIS — Z79899 Other long term (current) drug therapy: Secondary | ICD-10-CM | POA: Insufficient documentation

## 2020-02-17 DIAGNOSIS — C3490 Malignant neoplasm of unspecified part of unspecified bronchus or lung: Secondary | ICD-10-CM

## 2020-02-17 DIAGNOSIS — E871 Hypo-osmolality and hyponatremia: Secondary | ICD-10-CM

## 2020-02-17 DIAGNOSIS — E222 Syndrome of inappropriate secretion of antidiuretic hormone: Secondary | ICD-10-CM | POA: Insufficient documentation

## 2020-02-17 DIAGNOSIS — R7989 Other specified abnormal findings of blood chemistry: Secondary | ICD-10-CM | POA: Diagnosis not present

## 2020-02-17 DIAGNOSIS — I4891 Unspecified atrial fibrillation: Secondary | ICD-10-CM | POA: Insufficient documentation

## 2020-02-17 DIAGNOSIS — Z7901 Long term (current) use of anticoagulants: Secondary | ICD-10-CM

## 2020-02-17 DIAGNOSIS — C7951 Secondary malignant neoplasm of bone: Secondary | ICD-10-CM

## 2020-02-17 DIAGNOSIS — Z5112 Encounter for antineoplastic immunotherapy: Secondary | ICD-10-CM

## 2020-02-17 DIAGNOSIS — C3431 Malignant neoplasm of lower lobe, right bronchus or lung: Secondary | ICD-10-CM | POA: Diagnosis present

## 2020-02-17 LAB — COMPREHENSIVE METABOLIC PANEL
ALT: 16 U/L (ref 0–44)
AST: 19 U/L (ref 15–41)
Albumin: 3.5 g/dL (ref 3.5–5.0)
Alkaline Phosphatase: 72 U/L (ref 38–126)
Anion gap: 7 (ref 5–15)
BUN: 11 mg/dL (ref 8–23)
CO2: 26 mmol/L (ref 22–32)
Calcium: 8.8 mg/dL — ABNORMAL LOW (ref 8.9–10.3)
Chloride: 98 mmol/L (ref 98–111)
Creatinine, Ser: 0.7 mg/dL (ref 0.61–1.24)
GFR, Estimated: >60 mL/min (ref >60)
Glucose, Bld: 143 mg/dL — ABNORMAL HIGH (ref 70–99)
Potassium: 3.8 mmol/L (ref 3.5–5.1)
Sodium: 131 mmol/L — ABNORMAL LOW (ref 135–145)
Total Bilirubin: 0.5 mg/dL (ref 0.3–1.2)
Total Protein: 7.2 g/dL (ref 6.5–8.1)

## 2020-02-17 LAB — CBC WITH DIFFERENTIAL/PLATELET
Abs Immature Granulocytes: 0.1 10*3/uL — ABNORMAL HIGH (ref 0.00–0.07)
Basophils Absolute: 0 10*3/uL (ref 0.0–0.1)
Basophils Relative: 0 %
Eosinophils Absolute: 0 10*3/uL (ref 0.0–0.5)
Eosinophils Relative: 0 %
HCT: 38.3 % — ABNORMAL LOW (ref 39.0–52.0)
Hemoglobin: 13.5 g/dL (ref 13.0–17.0)
Immature Granulocytes: 2 %
Lymphocytes Relative: 16 %
Lymphs Abs: 1 10*3/uL (ref 0.7–4.0)
MCH: 33.5 pg (ref 26.0–34.0)
MCHC: 35.2 g/dL (ref 30.0–36.0)
MCV: 95 fL (ref 80.0–100.0)
Monocytes Absolute: 0.6 10*3/uL (ref 0.1–1.0)
Monocytes Relative: 11 %
Neutro Abs: 4.3 10*3/uL (ref 1.7–7.7)
Neutrophils Relative %: 71 %
Platelets: 218 10*3/uL (ref 150–400)
RBC: 4.03 MIL/uL — ABNORMAL LOW (ref 4.22–5.81)
RDW: 12.6 % (ref 11.5–15.5)
WBC: 6.1 10*3/uL (ref 4.0–10.5)
nRBC: 0 % (ref 0.0–0.2)

## 2020-02-17 LAB — TSH: TSH: 1.621 u[IU]/mL (ref 0.350–4.500)

## 2020-02-17 LAB — MAGNESIUM: Magnesium: 1.6 mg/dL — ABNORMAL LOW (ref 1.7–2.4)

## 2020-02-17 MED ORDER — SODIUM CHLORIDE 0.9 % IV SOLN
1200.0000 mg | Freq: Once | INTRAVENOUS | Status: AC
  Administered 2020-02-17: 1200 mg via INTRAVENOUS
  Filled 2020-02-17: qty 20

## 2020-02-17 MED ORDER — SODIUM CHLORIDE 0.9 % IV SOLN
Freq: Once | INTRAVENOUS | Status: AC
  Filled 2020-02-17: qty 250

## 2020-02-17 MED ORDER — HEPARIN SOD (PORK) LOCK FLUSH 100 UNIT/ML IV SOLN
500.0000 [IU] | Freq: Once | INTRAVENOUS | Status: AC | PRN
  Administered 2020-02-17: 500 [IU]
  Filled 2020-02-17: qty 5

## 2020-02-17 MED ORDER — SODIUM CHLORIDE 0.9% FLUSH
10.0000 mL | Freq: Once | INTRAVENOUS | Status: AC
  Administered 2020-02-17: 10 mL via INTRAVENOUS
  Filled 2020-02-17: qty 10

## 2020-02-17 NOTE — Progress Notes (Signed)
Patient received prescribed treatment in clinic today; tolerated well. Patient discharged to home in stable condition.

## 2020-02-18 ENCOUNTER — Telehealth: Payer: Self-pay

## 2020-02-18 LAB — T4: T4, Total: 6.7 ug/dL (ref 4.5–12.0)

## 2020-02-18 LAB — CEA: CEA: 3.8 ng/mL (ref 0.0–4.7)

## 2020-02-18 NOTE — Telephone Encounter (Signed)
-----   Message from Lequita Asal, MD sent at 02/17/2020  4:43 PM EST ----- Regarding: Please call patient  Sodium is low.  Is he taking his salt tablets?  M ----- Message ----- From: Buel Ream, Lab [facility] [place] Sent: 02/17/2020   3:07 PM EST To: Lequita Asal, MD

## 2020-02-18 NOTE — Telephone Encounter (Signed)
Spoke with the patient to inform him that his sodium was low and has he been taking his sodium tablets. The patient reports he was out of the sodium tablets for about 2-3 days but he was able to go pick them up. He has been taking them for the last 1-2 days. I have inform the patient and the MD.

## 2020-02-21 ENCOUNTER — Encounter: Payer: Self-pay | Admitting: Radiation Oncology

## 2020-02-24 ENCOUNTER — Ambulatory Visit
Admission: RE | Admit: 2020-02-24 | Discharge: 2020-02-24 | Disposition: A | Discharge status: Home / Self Care | Payer: Medicare Other | Source: Ambulatory Visit | Attending: Radiation Oncology | Admitting: Radiation Oncology

## 2020-02-24 VITALS — BP 139/99 | HR 79 | Temp 94.5°F | Resp 16 | Wt 136.8 lb

## 2020-02-24 DIAGNOSIS — Z298 Encounter for other specified prophylactic measures: Secondary | ICD-10-CM | POA: Diagnosis not present

## 2020-02-24 DIAGNOSIS — C3490 Malignant neoplasm of unspecified part of unspecified bronchus or lung: Secondary | ICD-10-CM

## 2020-02-24 DIAGNOSIS — Z923 Personal history of irradiation: Secondary | ICD-10-CM | POA: Diagnosis not present

## 2020-02-24 DIAGNOSIS — C3432 Malignant neoplasm of lower lobe, left bronchus or lung: Secondary | ICD-10-CM | POA: Diagnosis not present

## 2020-02-24 NOTE — Progress Notes (Signed)
Radiation Oncology Follow up Note  Name: Scott Jordan   Date:   02/24/2020 MRN:  [id number] DOB: [birth year]    This 69 y.o. male presents to the clinic today for 1 month follow-up status post PCI in patient with known l extensive stage small cell lung cancer  REFERRING PROVIDER: No ref. provider found  HPI: Patient is a 69 year old male with known extensive stage small cell lung cancer.  He is completed chemotherapy as well as radiation therapy.  To his whole brain.  He is seen today 1 month out.  He is currently on Tecentriq which she is tolerating well.  He specifically denies any change in neurologic status no focal neurologic complaints headaches or change in visual fields.  He has a mild nonproductive cough no increased shortness of breath or dysphagia.  COMPLICATIONS OF TREATMENT: none  FOLLOW UP COMPLIANCE: keeps appointments   PHYSICAL EXAM:  BP (!) 139/99 (BP Location: Left Arm, Patient Position: Sitting)   Pulse 79   Temp (!) 94.5 F (34.7 C) (Tympanic)   Resp 16   Wt 136 lb 12.8 oz (62.1 kg)   BMI 17.56 kg/m  Well-developed well-nourished patient in NAD. HEENT reveals PERLA, EOMI, discs not visualized.  Oral cavity is clear. No oral mucosal lesions are identified. Neck is clear without evidence of cervical or supraclavicular adenopathy. Lungs are clear to A&P. Cardiac examination is essentially unremarkable with regular rate and rhythm without murmur rub or thrill. Abdomen is benign with no organomegaly or masses noted. Motor sensory and DTR levels are equal and symmetric in the upper and lower extremities. Cranial nerves II through XII are grossly intact. Proprioception is intact. No peripheral adenopathy or edema is identified. No motor or sensory levels are noted. Crude visual fields are within normal range.  RADIOLOGY RESULTS: No current films to review  PLAN: Present time patient is doing well no significant side effects from his whole brain radiation.  He continues on  Tecentriq under medical oncology's care.  I have asked to see him back in 4 to 5 months for follow-up and then will discontinue follow-up care.  Patient knows to call with any concerns.  I would like to take this opportunity to thank you for allowing me to participate in the care of your patient.Noreene Filbert, MD

## 2020-02-26 ENCOUNTER — Encounter: Payer: Self-pay | Admitting: *Deleted

## 2020-02-26 ENCOUNTER — Ambulatory Visit: Payer: Medicare Other | Admitting: Gastroenterology

## 2020-02-26 ENCOUNTER — Telehealth: Payer: Self-pay

## 2020-02-26 NOTE — Telephone Encounter (Signed)
-----   Message from Lequita Asal, MD sent at 02/26/2020 12:52 PM EST ----- Regarding: FW: No Show  Please call patient to discuss missed appointment.  Hopefully he will agree to rescheduled appointment.  Please facilitate new appt with GI.  M ----- Message ----- From: Arie Sabina Sent: 02/26/2020  11:22 AM EST To: Lequita Asal, MD Subject: No Show

## 2020-02-26 NOTE — Telephone Encounter (Signed)
Patient states that he missed his appointment due to transportation issues. I asked him if he needed help rescheduling and he stated that he had the number and he would call to r/s.

## 2020-03-08 NOTE — Progress Notes (Signed)
[facility]  [address] [place], [place] [address] Phone: [phone]  Fax: [phone]   Clinic Day:  03/09/20  Referring physician: No ref. provider found  Chief Complaint: Scott Jordan is a 69 y.o. male with extensive stage small cell lung cancer who is seen for assessment prior to cycle #5 maintenance Tecentriq.  HPI: The patient was last seen in the medical oncology clinic on 02/17/2020. At that time, he had felt "alright."  He had chronic shortness of breath. He was eating well.  He denied any recent drug or alcohol use.  Exam was stable. Hematocrit was 38.3, hemoglobin 13.5, platelets 218,000, WBC 6,100. Sodium was 131. Calcium was 8.8. TSH was 1.621 and total T4 was 6.7. CEA was 3.8. He continued Eliquis. He received cycle #4 maintenance Tecentriq.  The patient saw Dr. Baruch Gouty for a follow up on 02/24/2020. He reported a mild nonproductive cough. He did not have any significant side effects from whole brain radiation. Follow up was planned for 4-5 months.  The patient missed his appointment with Dr. Allen Norris on 02/26/2020 due to transportation issues. Orlene Och, RN contacted the patient and he said he would call to reschedule.  During the interim, he has been okay. He has worsening aching and numbness in his left arm that started 2-3 weeks ago. The symptoms go from his left elbow to his fingers. His second and third fingers are worse than the others. He describes this pain as a 12/10 and it keeps him up at night. He cannot pick things up with his left hand. He has been using a heating pad which helps for a little while. When he holds his elbow, the pain improves, but when he lets go, it returns. These symptoms are beginning in his right hand as well. He denies neck pain or symptoms in his legs.  He denies any recent drug or alcohol use. He has a poor appetite. His shortness of breath is stable. He still takes salt tablets. The patient denies  fevers, sweats, weight loss, headaches, nausea, vomiting, diarrhea, mouth sores, and mouth tenderness.   Past Medical History:  Diagnosis Date  . A-fib ([place]) 01/10/2019   pt st this was dx by Dr. Ubaldo Glassing  . Cancer ([place]) 2021   LUNG  . Complication of anesthesia    DIFFICULTY WAKING UP AFTER SURGERY- 20 YRS AGO  . Hypertension   . Lung cancer ([place])   . Substance abuse [facility])     Past Surgical History:  Procedure Laterality Date  . ARM DEBRIDEMENT Left    INCISION AND DEBRIDEMENT LOWER ARM -20 YRS AGO  . BACK SURGERY    . PORTACATH PLACEMENT Left 08/29/2019   Procedure: INSERTION PORT-A-CATH;  Surgeon: Nestor Lewandowsky, MD;  Location: [facility] ORS;  Service: General;  Laterality: Left;    History reviewed. No pertinent family history.  Social History:  reports that he has quit smoking. His smoking use included cigarettes. He smoked 0.50 packs per day. He has never used smokeless tobacco. He reports current alcohol use. He reports previous drug use. Drug: Heroin. The patient denies any exposure to radiation or toxins. The patient lives in [place]. He is dependent on public transportation or transportation from friends/family. He has family friends staying with him to help care for his needs.His niece, Scott Jordan [phone]), is his medical power of attorney. The patient is alone today.    Allergies: No Known Allergies  Current Medications: Current Outpatient Medications  Medication Sig Dispense  Refill  . calcium carbonate (CALCIUM 600) 600 MG TABS tablet Take 600 mg by mouth daily with breakfast.    . dexamethasone (DECADRON) 4 MG tablet Take 1 tablet (4 mg total) by mouth daily. 25 tablet 0  . diltiazem (CARDIZEM CD) 240 MG 24 hr capsule Take 1 capsule (240 mg total) by mouth daily. 90 capsule 0  . ELIQUIS 5 MG TABS tablet Take 5 mg by mouth 2 (two) times daily.    Marland Kitchen escitalopram (LEXAPRO) 10 MG tablet Take 1 tablet (10 mg total) by mouth daily. 90 tablet 0  . feeding supplement,  ENSURE ENLIVE, (ENSURE ENLIVE) LIQD Take 237 mLs by mouth 3 (three) times daily between meals. 237 mL 12  . furosemide (LASIX) 20 MG tablet Take 1 tablet (20 mg total) by mouth daily. 20 tablet 1  . HYDROcodone-acetaminophen (NORCO/VICODIN) 5-325 MG tablet Take 1 tablet by mouth every 6 (six) hours as needed for moderate pain. 30 tablet 0  . lidocaine-prilocaine (EMLA) cream Apply to affected area once 30 g 3  . Magnesium 500 MG CAPS Take 500 mg by mouth daily.    . metoprolol succinate (TOPROL-XL) 50 MG 24 hr tablet Take 1 tablet (50 mg total) by mouth daily. Take with or immediately following a meal. 90 tablet 0  . nystatin (MYCOSTATIN) 100000 UNIT/ML suspension Use as directed 5 mLs (500,000 Units total) in the mouth or throat 4 (four) times daily. Swish and spit 60 mL 0  . ondansetron (ZOFRAN) 8 MG tablet Take 1 tablet (8 mg total) by mouth every 8 (eight) hours as needed for refractory nausea / vomiting. Start on day 3 after carboplatin chemo. 30 tablet 1  . potassium chloride SA (KLOR-CON) 20 MEQ tablet TAKE 1 TABLET BY MOUTH DAILY FOR 3 DAYS (Patient taking differently: Take 20 mEq by mouth daily. ) 3 tablet 0  . sodium chloride 1 g tablet Take 1 tablet (1 g total) by mouth 3 (three) times daily with meals. 90 tablet 1   No current facility-administered medications for this visit.   Facility-Administered Medications Ordered in Other Visits  Medication Dose Route Frequency Provider Last Rate Last Admin  . heparin lock flush 100 unit/mL  500 Units Intravenous Once Jaida Basurto C, MD      . sodium chloride flush (NS) 0.9 % injection 10 mL  10 mL Intravenous PRN Lequita Asal, MD   10 mL at 11/11/19 3299    Review of Systems  Constitutional: Positive for weight loss (2 lbs). Negative for chills, diaphoresis, fever and malaise/fatigue.  HENT: Negative for congestion, ear discharge, ear pain, hearing loss, nosebleeds, sinus pain, sore throat and tinnitus.   Eyes: Negative.  Negative  for blurred vision and double vision.  Respiratory: Positive for sputum production (phlegm) and shortness of breath (on exertion, stable). Negative for cough and hemoptysis.   Cardiovascular: Negative.  Negative for chest pain, palpitations and leg swelling.  Gastrointestinal: Negative for abdominal pain, blood in stool, constipation, diarrhea, heartburn, melena, nausea and vomiting.       Poor appetite.  Genitourinary: Negative.  Negative for dysuria, frequency, hematuria and urgency.  Musculoskeletal: Negative.  Negative for back pain, joint pain, myalgias and neck pain.  Skin: Negative for itching and rash.  Neurological: Positive for sensory change (pain and numbness in left forearm, hand, and fingers. symptoms are starting in his right hand too). Negative for dizziness, tingling, weakness and headaches.  Endo/Heme/Allergies: Negative.   Psychiatric/Behavioral: Negative for depression, memory loss and substance  abuse (denies drug and alcohol use). The patient has insomnia (due to arm pain). The patient is not nervous/anxious.   All other systems reviewed and are negative.  Performance status (ECOG): 1  Vitals Blood pressure (!) 152/99, pulse (!) 112, temperature (!) 96.1 F (35.6 C), temperature source Tympanic, resp. rate 18, weight 133 lb 13.1 oz (60.7 kg).   Physical Exam Vitals and nursing note reviewed.  Constitutional:      General: He is not in acute distress.    Appearance: Normal appearance. He is not ill-appearing, toxic-appearing or diaphoretic.     Interventions: Face mask in place.  HENT:     Head: Normocephalic and atraumatic.     Comments: Albertina Parr.    Mouth/Throat:     Mouth: Mucous membranes are moist.     Pharynx: No oropharyngeal exudate or posterior oropharyngeal erythema.  Eyes:     General: No scleral icterus.    Extraocular Movements: Extraocular movements intact.     Conjunctiva/sclera: Conjunctivae normal.     Pupils: Pupils are equal, round, and  reactive to light.     Comments: Blue eyes.  Cardiovascular:     Rate and Rhythm: Normal rate and regular rhythm.     Heart sounds: No murmur heard.  No gallop.   Pulmonary:     Effort: Pulmonary effort is normal.     Breath sounds: No wheezing, rhonchi or rales.     Comments: Decreased breath sounds on left side. Abdominal:     General: There is no distension.     Palpations: Abdomen is soft. There is no hepatomegaly, splenomegaly or mass.     Tenderness: There is no abdominal tenderness. There is no guarding or rebound.  Musculoskeletal:        General: Tenderness (left elbow) present. No swelling. Normal range of motion.     Cervical back: Normal range of motion.     Right lower leg: No edema.     Left lower leg: No edema.  Lymphadenopathy:     Head:     Right side of head: No preauricular, posterior auricular or occipital adenopathy.     Left side of head: No preauricular, posterior auricular or occipital adenopathy.     Cervical: No cervical adenopathy.     Upper Body:     Right upper body: No supraclavicular or axillary adenopathy.     Left upper body: No supraclavicular or axillary adenopathy.     Lower Body: No right inguinal adenopathy. No left inguinal adenopathy.  Skin:    General: Skin is warm and dry.     Findings: No bruising, erythema or rash.  Neurological:     Mental Status: He is alert and oriented to person, place, and time.     Sensory: No sensory deficit.     Motor: No weakness.     Comments: Numbness from right wrist to fingertips.  Pain from left elbow to fingertips. Hand strength is good bilaterally, but it takes more effort to use his left hand.  Psychiatric:        Mood and Affect: Mood normal.        Behavior: Behavior normal.        Thought Content: Thought content normal.        Judgment: Judgment normal.     Imaging studies: 08/19/2019:  Chest CT without contrast revealed the left pleural pigtail drain. There was a small residual left  hydropneumothorax. There was a 6.7 x 4 cm medial basilar  left lower lobe lung masssuspicious for primary bronchogenic carcinoma. There was a lingular 1.0 cm solid pulmonary nodulesuspicious for ipsilateral metastasis. There was intralobular septal thickening and patchy groundglass opacity throughout the lingula and the left lower lobe with moderate residual atelectasis of the left lung base. A component of lymphangitic carcinomatosis was suspected. There was ipsilateral infra hilar, subcarinal, AP window, prevascular and upper retroperitoneal adenopathyc/wmetastatic disease. 08/21/2019:  Head MRI revealed no evidence of metastatic disease. 08/29/2019:  PET scan revealed markedly hypermetabolic left lower lobe pulmonary lesion (SUV 7.9) c/w the patient's known malignancy. There was associated hypermetabolic nodal metastases in the mediastinum and left hilum with metastatic involvement of left pleural space. Additional sites included upper abdominal hypermetabolic lymphadenopathy and scattered hypermetabolic bone metastases (left acetabulum, sacrum, and L5). There was interval decrease in left pleural effusion with areas of loculation. 10/11/2019:  Chest, abdomen, and pelvis CT revealed considerable reduction in size of prior hypermetabolic lymph nodes in the chest and abdomen, compatible with response to therapy. There was notably reduced size of the left pleural effusion although some complexity or loculation persists. The previous areas of hypermetabolic nodularity along the pleural surface were not obviously nodular along the pleural surface. There was considerable reduction in size of the previously consolidated/collapsed left lower lobe, although there was still some residual indistinct opacity in this region. The previous hypermetabolic osseous metastatic lesions were relatively occult.  There was mild airway thickening was present, suggesting bronchitis or reactive airways disease. There was trace  pelvic ascites, improved from prior. There was chronic deformity of the left medial clavicle likely due to an old fracture with some deformity of the left sternoclavicular joint. 11/23/2019: Chest CT angiogram revealed no pulmonary embolus.  There was new patchy and ground-glass opacity in the dependent right lower lobe, perifissural right middle and right upper lobe. Findings were suspicious for pneumonia, including aspiration or atypical viral organisms such as COVID-19. There was a small left pleural effusion with slight increase size from prior exam. Volume loss in the left lower lobe with areas of ill-defined consolidation, was not significantly changed from prior. Right hilar adenopathy was new from prior exam, nonspecific in this setting. Recommend attention at follow-up. There was debris within the trachea and to a lesser extent right mainstem bronchus. There was right lower lobe bronchial thickening with areas of mucous plugging.  11/29/2019:  Abdomen and pelvis CT revealed residual patchy nodular foci of consolidation at the right lung base, overall decreased since 11/22/2019 favoring resolving bronchopneumonia.  Follow-up chest CT was advised.  There was a stable small dependent left pleural effusion and stable top normal size previously hypermetabolic left periaortic lymph node.  There was stable faint sclerotic left pelvic bone lesions.  There were no new or progressive metastatic disease in the abdomen or pelvis.   Infusion on 03/09/2020  Component Date Value Ref Range Status  . Magnesium 03/09/2020 1.8  1.7 - 2.4 mg/dL Final   Performed at [facility], [address] [place], [place] [address]  . Sodium 03/09/2020 131* 135 - 145 mmol/L Final  . Potassium 03/09/2020 4.1  3.5 - 5.1 mmol/L Final  . Chloride 03/09/2020 97* 98 - 111 mmol/L Final  . CO2 03/09/2020 26  22 - 32 mmol/L Final  . Glucose, Bld 03/09/2020 107* 70 - 99 mg/dL Final   Glucose reference range applies only  to samples taken after fasting for at least 8 hours.  . BUN 03/09/2020 15  8 - 23 mg/dL Final  .  Creatinine, Ser 03/09/2020 0.66  0.61 - 1.24 mg/dL Final  . Calcium 03/09/2020 9.2  8.9 - 10.3 mg/dL Final  . Total Protein 03/09/2020 7.6  6.5 - 8.1 g/dL Final  . Albumin 03/09/2020 3.6  3.5 - 5.0 g/dL Final  . AST 03/09/2020 19  15 - 41 U/L Final  . ALT 03/09/2020 15  0 - 44 U/L Final  . Alkaline Phosphatase 03/09/2020 72  38 - 126 U/L Final  . Total Bilirubin 03/09/2020 1.1  0.3 - 1.2 mg/dL Final  . GFR, Estimated 03/09/2020 >60  >60 mL/min Final   Comment: (NOTE) Calculated using the CKD-EPI Creatinine Equation (2021)   . Anion gap 03/09/2020 8  5 - 15 Final   Performed at [facility], [address], [place], [place] [address]  . WBC 03/09/2020 6.0  4.0 - 10.5 K/uL Final  . RBC 03/09/2020 4.34  4.22 - 5.81 MIL/uL Final  . Hemoglobin 03/09/2020 14.5  13.0 - 17.0 g/dL Final  . HCT 03/09/2020 40.3  39 - 52 % Final  . MCV 03/09/2020 92.9  80.0 - 100.0 fL Final  . MCH 03/09/2020 33.4  26.0 - 34.0 pg Final  . MCHC 03/09/2020 36.0  30.0 - 36.0 g/dL Final  . RDW 03/09/2020 13.0  11.5 - 15.5 % Final  . Platelets 03/09/2020 186  150 - 400 K/uL Final  . nRBC 03/09/2020 0.0  0.0 - 0.2 % Final  . Neutrophils Relative % 03/09/2020 68  % Final  . Neutro Abs 03/09/2020 4.1  1.7 - 7.7 K/uL Final  . Lymphocytes Relative 03/09/2020 17  % Final  . Lymphs Abs 03/09/2020 1.0  0.7 - 4.0 K/uL Final  . Monocytes Relative 03/09/2020 13  % Final  . Monocytes Absolute 03/09/2020 0.8  0.1 - 1.0 K/uL Final  . Eosinophils Relative 03/09/2020 0  % Final  . Eosinophils Absolute 03/09/2020 0.0  0.0 - 0.5 K/uL Final  . Basophils Relative 03/09/2020 0  % Final  . Basophils Absolute 03/09/2020 0.0  0.0 - 0.1 K/uL Final  . Immature Granulocytes 03/09/2020 2  % Final  . Abs Immature Granulocytes 03/09/2020 0.09* 0.00 - 0.07 K/uL Final   Performed at [facility], [address]  [place], [place] [address]    Assessment:  JAYLEEN AFONSO is a 69 y.o. male with extensive stage small cell lung cancer. He has a >40 pack year smoking. He presented with a recurrent left sided pleural effusion. He has undergone thoracentesis x 2 in the past month. He is s/p pigtail drain. Initial cytology x 2 was negative (lymphocytic exudative effusion).Pleural fluid cytologyon 08/18/2019 confirmed small cell lung cancer.  Chest CT without contraston 08/19/2019 revealed the left pleural pigtail drain. There was a small residual left hydropneumothorax. There was a 6.7 x 4 cm medial basilar left lower lobe lung masssuspicious for primary bronchogenic carcinoma. There was a lingular 1.0 cm solid pulmonary nodulesuspicious for ipsilateral metastasis. There was intralobular septal thickening and patchy groundglass opacity throughout the lingula and the left lower lobe with moderate residual atelectasis of the left lung base. A component of lymphangitic carcinomatosis was suspected. There was ipsilateral infra hilar, subcarinal, AP window, prevascular and upper retroperitoneal adenopathyc/wmetastatic disease.  Head MRI on 08/21/2019 revealed no evidence of metastatic disease.  PET scan on 05/20/20201 revealed markedly hypermetabolic left lower lobe pulmonary lesion (SUV 7.9) c/w the patient's known malignancy. There was associated hypermetabolic nodal metastases in the mediastinum and left hilum with metastatic  involvement of left pleural space. Additional sites included upper abdominal hypermetabolic lymphadenopathy and scattered hypermetabolic bone metastases (left acetabulum, sacrum, and L5). There was interval decrease in left pleural effusion with areas of loculation.  CEA was 3.7 on 09/02/2019.  LDH was 206 on 09/23/2019.  He is s/p 4 cycles of carboplatin, etoposide, and Tecentriq (09/02/2019- 11/11/2019) with Margarette [place] support.  He is s/p 4 cycles of maintenance Tecentriq (12/02/2019 -  02/17/2020).  He received PCI from 01/09/2020 - 01/22/2020.   Chest CT angiogram on 11/23/2019 revealed no pulmonary embolus.  There was new patchy and ground-glass opacity in the dependent right lower lobe, perifissural right middle and right upper lobe. Findings were suspicious for pneumonia, including aspiration or atypical viral organisms such as COVID-19. There was a small left pleural effusion with slight increase size from prior exam. Volume loss in the left lower lobe with areas of ill-defined consolidation, was not significantly changed from prior. Right hilar adenopathy was new from prior exam, nonspecific in this setting. Recommend attention at follow-up. There was debris within the trachea and to a lesser extent right mainstem bronchus. There was right lower lobe bronchial thickening with areas of mucous plugging.   Abdomen and pelvis CT on 11/29/2019 revealed residual patchy nodular foci of consolidation at the right lung base, overall decreased since 11/22/2019 favoring resolving bronchopneumonia.  Follow-up chest CT was advised.  There was a stable small dependent left pleural effusion and stable top normal size previously hypermetabolic left periaortic lymph node.  There was stable faint sclerotic left pelvic bone lesions.  There were no new or progressive metastatic disease in the abdomen or pelvis.  He was admitted to [facility] from 11/22/2019 - 11/25/2019 with a heroin overdose and aspiration pneumonia.  He received 1 unit of PRBCs.  He was treated with Zosyn.  He was discharged on Augmentin x5 days.  He has hyponatremiasecondary to SIADH.  He began sodium chloride tablets (1 gm TID) on 09/04/2019.  He has elevated LFTs. Hepatitis C antibody and hepatitis B core antibody total were positive on 12/23/2019.  Hepatitis B surface antigen, hepatitis B core IgM and hepatitis A IgM were negative.  Hepatitis B DNA was not detected. Hepatitis C RNA quantitative revealed 3.869 log 10 IU/mL (7400  IU/mL).  Hepatitis C genotype is 2b.  He has a significant family history of malignancy. A family member has had genetic testing (negative for Lynch syndrome).  Symptomatically, he has been "ok". He has worsening aching and numbness in his left arm; symptoms go from his left elbow to his fingers. His second and third fingers are worse than the others.  He cannot pick things up with his left hand. He has been using a heating pad which helps for a little while. When he holds his elbow, the pain improves.  He has some symptoms in his right hand as well.  He denies neck pain or symptoms in his legs.  Shortness of breath is stable.  Exam reveals decreased breath sounds at the left base.  Plan: 1.   Labs today: CBC with diff, CMP, Mg, TSH. 2. Metastatic small cell lung cancer Clinically, he has unexplained new symptoms today. He is s/p 4 cycles of carboplatin, etoposide and Tecentriq (last 11/11/2019). Chest CT angiogram plus abdomen and pelvis CT in 08/2021revealed adramatic response to treatment. He completed PCI on 01/22/2020.  He iss/p4 cycles of maintenance Tecentriq (last  02/17/2020). Cycle #2 maintenance Tecentriq was held secondary to increased LFTs on 12/30/2019. No evidence for [employer]  induced hepatitis.   Etiology felt secondary to possible alcohol or less likely hepatitis C.  Discuss concern for new symptoms today.   Postpone treatment. CXR to day to assess decreased breath sounds at the left base.  Schedule PET scan for restaging.  Discuss symptom management.  He has antiemetics at home to use on a prn bases.  Interventions are adequate. 3. Hyponatremia Sodium131.  Patient continue salt tablets twice daily and fluid restriction.  Continue to monitor. 4. History of atrial fibrillation Continue Eliquis.  Patient denies  any excess bruising or bleeding. 5. Upper extremity neurologic symptoms Etiology unclear. Left-sided symptoms appear elated to possible nerve entrapment at the elbow. Right-sided symptoms appear at the wrist.             Head and neck MRI to assess bilateral upper extremity numbness.  Neurology consult. 6.   Hepatitis C  Encourage follow-up with Dr. Allen Norris. 7.   Weight loss  Encourage caloric intake  Continue to monitor. 8.   No treatment today. 9.   CXR (PA and lateral) STAT. 10.   Head and neck MRI- bilateral lower upper extremity numbness. 11.   PET scan. 12.   Neurology consult. 58.   RTC after imaging studies for MD assessment, review of imaging and discussion regarding direction of therapy.  I discussed the assessment and treatment plan with the patient.  The patient was provided an opportunity to ask questions and all were answered.  The patient agreed with the plan and demonstrated an understanding of the instructions.  The patient was advised to call back if the symptoms worsen or if the condition fails to improve as anticipated.   Lequita Asal, MD, PhD    02/17/2020, 2:19 PM  I, Mirian Mo Tufford, am acting as Education administrator for [employer]. Mike Gip, MD, PhD.  I, Rhetta Cleek C. Mike Gip, MD, have reviewed the above documentation for accuracy and completeness, and I agree with the above. ve.

## 2020-03-09 ENCOUNTER — Inpatient Hospital Stay (HOSPITAL_BASED_OUTPATIENT_CLINIC_OR_DEPARTMENT_OTHER): Payer: Medicare Other | Admitting: Hematology and Oncology

## 2020-03-09 ENCOUNTER — Other Ambulatory Visit: Payer: Self-pay

## 2020-03-09 ENCOUNTER — Ambulatory Visit
Admission: RE | Admit: 2020-03-09 | Discharge: 2020-03-09 | Disposition: A | Discharge status: Home / Self Care | Payer: Medicare Other | Attending: Hematology and Oncology | Admitting: Hematology and Oncology

## 2020-03-09 ENCOUNTER — Inpatient Hospital Stay: Payer: Medicare Other

## 2020-03-09 ENCOUNTER — Ambulatory Visit
Admission: RE | Admit: 2020-03-09 | Discharge: 2020-03-09 | Disposition: A | Discharge status: Home / Self Care | Payer: Medicare Other | Source: Ambulatory Visit | Attending: Hematology and Oncology | Admitting: Hematology and Oncology

## 2020-03-09 ENCOUNTER — Encounter: Payer: Self-pay | Admitting: Hematology and Oncology

## 2020-03-09 VITALS — BP 152/99 | HR 112 | Temp 96.1°F | Resp 18 | Wt 133.8 lb

## 2020-03-09 DIAGNOSIS — J9811 Atelectasis: Secondary | ICD-10-CM | POA: Diagnosis not present

## 2020-03-09 DIAGNOSIS — E222 Syndrome of inappropriate secretion of antidiuretic hormone: Secondary | ICD-10-CM | POA: Diagnosis not present

## 2020-03-09 DIAGNOSIS — G5690 Unspecified mononeuropathy of unspecified upper limb: Secondary | ICD-10-CM

## 2020-03-09 DIAGNOSIS — I4891 Unspecified atrial fibrillation: Secondary | ICD-10-CM | POA: Diagnosis not present

## 2020-03-09 DIAGNOSIS — J90 Pleural effusion, not elsewhere classified: Secondary | ICD-10-CM | POA: Diagnosis not present

## 2020-03-09 DIAGNOSIS — Z7901 Long term (current) use of anticoagulants: Secondary | ICD-10-CM

## 2020-03-09 DIAGNOSIS — R7989 Other specified abnormal findings of blood chemistry: Secondary | ICD-10-CM | POA: Diagnosis not present

## 2020-03-09 DIAGNOSIS — C3490 Malignant neoplasm of unspecified part of unspecified bronchus or lung: Secondary | ICD-10-CM | POA: Diagnosis not present

## 2020-03-09 DIAGNOSIS — C3412 Malignant neoplasm of upper lobe, left bronchus or lung: Secondary | ICD-10-CM | POA: Diagnosis not present

## 2020-03-09 DIAGNOSIS — E871 Hypo-osmolality and hyponatremia: Secondary | ICD-10-CM | POA: Diagnosis not present

## 2020-03-09 DIAGNOSIS — Z79899 Other long term (current) drug therapy: Secondary | ICD-10-CM | POA: Diagnosis not present

## 2020-03-09 DIAGNOSIS — C7951 Secondary malignant neoplasm of bone: Secondary | ICD-10-CM

## 2020-03-09 DIAGNOSIS — Z5112 Encounter for antineoplastic immunotherapy: Secondary | ICD-10-CM | POA: Diagnosis not present

## 2020-03-09 DIAGNOSIS — J439 Emphysema, unspecified: Secondary | ICD-10-CM | POA: Diagnosis not present

## 2020-03-09 DIAGNOSIS — B182 Chronic viral hepatitis C: Secondary | ICD-10-CM

## 2020-03-09 LAB — MAGNESIUM: Magnesium: 1.8 mg/dL (ref 1.7–2.4)

## 2020-03-09 LAB — CBC WITH DIFFERENTIAL/PLATELET
Abs Immature Granulocytes: 0.09 10*3/uL — ABNORMAL HIGH (ref 0.00–0.07)
Basophils Absolute: 0 10*3/uL (ref 0.0–0.1)
Basophils Relative: 0 %
Eosinophils Absolute: 0 10*3/uL (ref 0.0–0.5)
Eosinophils Relative: 0 %
HCT: 40.3 % (ref 39.0–52.0)
Hemoglobin: 14.5 g/dL (ref 13.0–17.0)
Immature Granulocytes: 2 %
Lymphocytes Relative: 17 %
Lymphs Abs: 1 10*3/uL (ref 0.7–4.0)
MCH: 33.4 pg (ref 26.0–34.0)
MCHC: 36 g/dL (ref 30.0–36.0)
MCV: 92.9 fL (ref 80.0–100.0)
Monocytes Absolute: 0.8 10*3/uL (ref 0.1–1.0)
Monocytes Relative: 13 %
Neutro Abs: 4.1 10*3/uL (ref 1.7–7.7)
Neutrophils Relative %: 68 %
Platelets: 186 10*3/uL (ref 150–400)
RBC: 4.34 MIL/uL (ref 4.22–5.81)
RDW: 13 % (ref 11.5–15.5)
WBC: 6 10*3/uL (ref 4.0–10.5)
nRBC: 0 % (ref 0.0–0.2)

## 2020-03-09 LAB — COMPREHENSIVE METABOLIC PANEL
ALT: 15 U/L (ref 0–44)
AST: 19 U/L (ref 15–41)
Albumin: 3.6 g/dL (ref 3.5–5.0)
Alkaline Phosphatase: 72 U/L (ref 38–126)
Anion gap: 8 (ref 5–15)
BUN: 15 mg/dL (ref 8–23)
CO2: 26 mmol/L (ref 22–32)
Calcium: 9.2 mg/dL (ref 8.9–10.3)
Chloride: 97 mmol/L — ABNORMAL LOW (ref 98–111)
Creatinine, Ser: 0.66 mg/dL (ref 0.61–1.24)
GFR, Estimated: >60 mL/min (ref >60)
Glucose, Bld: 107 mg/dL — ABNORMAL HIGH (ref 70–99)
Potassium: 4.1 mmol/L (ref 3.5–5.1)
Sodium: 131 mmol/L — ABNORMAL LOW (ref 135–145)
Total Bilirubin: 1.1 mg/dL (ref 0.3–1.2)
Total Protein: 7.6 g/dL (ref 6.5–8.1)

## 2020-03-09 MED ORDER — HEPARIN SOD (PORK) LOCK FLUSH 100 UNIT/ML IV SOLN
500.0000 [IU] | Freq: Once | INTRAVENOUS | Status: AC
  Administered 2020-03-09: 500 [IU] via INTRAVENOUS
  Filled 2020-03-09: qty 5

## 2020-03-09 NOTE — Progress Notes (Signed)
Referral faxed to Dr. Trena Platt office.

## 2020-03-09 NOTE — Progress Notes (Signed)
Patient here for oncology follow-up appointment, expresses complaints of left hand lost of feeling and short of breath.

## 2020-03-09 NOTE — Progress Notes (Signed)
No tx today, pt for chest xray and scan, will follow up with MD regarding results, pt d/ced to radiology for outpt xrays

## 2020-03-10 ENCOUNTER — Ambulatory Visit
Admission: RE | Admit: 2020-03-10 | Discharge: 2020-03-10 | Disposition: A | Discharge status: Home / Self Care | Payer: Medicare Other | Source: Ambulatory Visit | Attending: Hematology and Oncology | Admitting: Hematology and Oncology

## 2020-03-10 DIAGNOSIS — C3490 Malignant neoplasm of unspecified part of unspecified bronchus or lung: Secondary | ICD-10-CM

## 2020-03-10 DIAGNOSIS — H748X3 Other specified disorders of middle ear and mastoid, bilateral: Secondary | ICD-10-CM | POA: Diagnosis not present

## 2020-03-10 DIAGNOSIS — G5690 Unspecified mononeuropathy of unspecified upper limb: Secondary | ICD-10-CM

## 2020-03-10 DIAGNOSIS — R202 Paresthesia of skin: Secondary | ICD-10-CM | POA: Diagnosis not present

## 2020-03-10 DIAGNOSIS — R200 Anesthesia of skin: Secondary | ICD-10-CM | POA: Diagnosis not present

## 2020-03-10 DIAGNOSIS — M4319 Spondylolisthesis, multiple sites in spine: Secondary | ICD-10-CM | POA: Diagnosis not present

## 2020-03-10 LAB — TSH: TSH: 1.346 u[IU]/mL (ref 0.350–4.500)

## 2020-03-10 MED ORDER — GADOBUTROL 1 MMOL/ML IV SOLN
6.0000 mL | Freq: Once | INTRAVENOUS | Status: AC | PRN
  Administered 2020-03-10: 6 mL via INTRAVENOUS

## 2020-03-19 ENCOUNTER — Ambulatory Visit
Admission: RE | Admit: 2020-03-19 | Discharge: 2020-03-19 | Disposition: A | Discharge status: Home / Self Care | Payer: Medicare Other | Source: Ambulatory Visit | Attending: Hematology and Oncology | Admitting: Hematology and Oncology

## 2020-03-19 ENCOUNTER — Other Ambulatory Visit: Payer: Self-pay

## 2020-03-19 DIAGNOSIS — J432 Centrilobular emphysema: Secondary | ICD-10-CM | POA: Diagnosis not present

## 2020-03-19 DIAGNOSIS — N281 Cyst of kidney, acquired: Secondary | ICD-10-CM | POA: Diagnosis not present

## 2020-03-19 DIAGNOSIS — C3490 Malignant neoplasm of unspecified part of unspecified bronchus or lung: Secondary | ICD-10-CM | POA: Insufficient documentation

## 2020-03-19 DIAGNOSIS — I2510 Atherosclerotic heart disease of native coronary artery without angina pectoris: Secondary | ICD-10-CM | POA: Diagnosis not present

## 2020-03-19 DIAGNOSIS — I700 Atherosclerosis of aorta: Secondary | ICD-10-CM | POA: Diagnosis not present

## 2020-03-19 LAB — GLUCOSE, CAPILLARY: Glucose-Capillary: 124 mg/dL — ABNORMAL HIGH (ref 70–99)

## 2020-03-19 MED ORDER — FLUDEOXYGLUCOSE F - 18 (FDG) INJECTION
7.1300 | Freq: Once | INTRAVENOUS | Status: AC | PRN
  Administered 2020-03-19: 7.13 via INTRAVENOUS

## 2020-03-19 NOTE — Progress Notes (Signed)
[facility]  [address], [address] [place], [place] [address] Phone: [phone]  Fax: [phone]   Clinic Day:  03/23/20  Referring physician: No ref. provider found  Chief Complaint: Scott Jordan is a 69 y.o. male with extensive stage small cell lung cancer who is seen for review of imaging and discussion regarding direction of therapy.  HPI: The patient was last seen in the medical oncology clinic on 03/09/2020. At that time, he felt "ok". He described worsening aching and numbness in his left arm; symptoms extended from his left elbow to his fingers. His second and third fingers were worse than the others.  He could not pick things up with his left hand. He was using a heating pad. When he held his elbow, the pain improved.  He had some symptoms in his right hand as well.  He denies neck pain or symptoms in his legs.  Shortness of breath was stable.  Exam reveals decreased breath sounds at the left base.  Hematocrit was 40.3, hemoglobin 14.5, platelets 186,000, WBC 6,000. Sodium was 131. TSH was 1.346. Magnesium was 1.8. Maintenance Tecentriq was held.  CXR showed left base atelectasis with small left pleural effusion, resent on prior CT examination. There was no new opacity evident.  There was a stable cardiac silhouette. There was no adenopathy evident. Port-A-Cath tip was at the cavoatrial junction.  Head MRI with and without contrast on 03/10/2020 revealed no evidence of intracranial metastatic disease. There were remote small infarcts in the left corona radiata and left thalamus, unchanged. There was stable moderate white matter disease, likely representing chronic small vessel ischemic disease. There were bilateral mastoid effusions.  Cervical spine MRI with and without contrast on 03/10/2020 revealed no evidence of metastatic disease to the cervical spine. There were multilevel degenerative changes of the cervical spine, worst at C5-C6 where there was  moderate spinal canal stenosis and severe bilateral neural foraminal narrowing. There was severe right neural foraminal narrowing at C4-5. There was moderate to severe left neural foraminal narrowing at C3-4.  PET scan on 03/19/2020 revealed mild patchy left lower lobe opacity, stable versus mildly improved, without appreciable hypermetabolism on PET. While a small focus of residual tumor was difficult to entirely exclude, for the most part this favored treated disease.  There were no findings suspicious for metastatic disease.  During the interim, he has been "alright." He is having a bad left hand burning pain. The pain started in his elbow and has spread to his entire left hand. He cannot pick up or hold anything with his left hand. He has been using a heating pad which helps, but the pain comes back when he stops using it. If he keeps his arm bent, the pain is better, but when he straightens his elbow he feels a shocking sensation. His left fingers are a little bit numb.  He takes his blood pressure medication around 2:30pm so he has not taken it yet today. He has a blood pressure cuff at home but has not checked it recently. His cardiologist is Dr. Ubaldo Glassing.   Past Medical History:  Diagnosis Date  . A-fib ([place]) 01/10/2019   pt st this was dx by Dr. Ubaldo Glassing  . Cancer ([place]) 2021   LUNG  . Complication of anesthesia    DIFFICULTY WAKING UP AFTER SURGERY- 20 YRS AGO  . Hypertension   . Lung cancer ([place])   . Substance abuse [facility])     Past Surgical History:  Procedure  Laterality Date  . ARM DEBRIDEMENT Left    INCISION AND DEBRIDEMENT LOWER ARM -20 YRS AGO  . BACK SURGERY    . PORTACATH PLACEMENT Left 08/29/2019   Procedure: INSERTION PORT-A-CATH;  Surgeon: Nestor Lewandowsky, MD;  Location: [facility] ORS;  Service: General;  Laterality: Left;    History reviewed. No pertinent family history.  Social History:  reports that he has quit smoking. His smoking use included cigarettes. He smoked 0.50 packs  per day. He has never used smokeless tobacco. He reports current alcohol use. He reports previous drug use. Drug: Heroin. The patient denies any exposure to radiation or toxins. The patient lives in [place]. He is dependent on public transportation or transportation from friends/family. He has family friends staying with him to help care for his needs.His niece, [name] [phone]), is his medical power of attorney. The patient is alone today.    Allergies: No Known Allergies  Current Medications: Current Outpatient Medications  Medication Sig Dispense Refill  . calcium carbonate (OS-CAL) 600 MG TABS tablet Take 600 mg by mouth daily with breakfast.    . dexamethasone (DECADRON) 4 MG tablet Take 1 tablet (4 mg total) by mouth daily. 25 tablet 0  . diltiazem (CARDIZEM CD) 240 MG 24 hr capsule Take 1 capsule (240 mg total) by mouth daily. 90 capsule 0  . ELIQUIS 5 MG TABS tablet Take 5 mg by mouth 2 (two) times daily.    Marland Kitchen escitalopram (LEXAPRO) 10 MG tablet Take 1 tablet (10 mg total) by mouth daily. 90 tablet 0  . feeding supplement, ENSURE ENLIVE, (ENSURE ENLIVE) LIQD Take 237 mLs by mouth 3 (three) times daily between meals. 237 mL 12  . furosemide (LASIX) 20 MG tablet Take 1 tablet (20 mg total) by mouth daily. 20 tablet 1  . lidocaine-prilocaine (EMLA) cream Apply to affected area once 30 g 3  . Magnesium 500 MG CAPS Take 500 mg by mouth daily.    . metoprolol succinate (TOPROL-XL) 50 MG 24 hr tablet Take 1 tablet (50 mg total) by mouth daily. Take with or immediately following a meal. 90 tablet 0  . potassium chloride SA (KLOR-CON) 20 MEQ tablet TAKE 1 TABLET BY MOUTH DAILY FOR 3 DAYS (Patient taking differently: Take 20 mEq by mouth daily.) 3 tablet 0  . sodium chloride 1 g tablet Take 1 tablet (1 g total) by mouth 3 (three) times daily with meals. 90 tablet 1  . HYDROcodone-acetaminophen (NORCO/VICODIN) 5-325 MG tablet Take 1 tablet by mouth every 6 (six) hours as needed for  moderate pain. (Patient not taking: Reported on 03/23/2020) 30 tablet 0  . nystatin (MYCOSTATIN) 100000 UNIT/ML suspension Use as directed 5 mLs (500,000 Units total) in the mouth or throat 4 (four) times daily. Swish and spit (Patient not taking: Reported on 03/23/2020) 60 mL 0  . ondansetron (ZOFRAN) 8 MG tablet Take 1 tablet (8 mg total) by mouth every 8 (eight) hours as needed for refractory nausea / vomiting. Start on day 3 after carboplatin chemo. (Patient not taking: Reported on 03/23/2020) 30 tablet 1   No current facility-administered medications for this visit.   Facility-Administered Medications Ordered in Other Visits  Medication Dose Route Frequency Provider Last Rate Last Admin  . heparin lock flush 100 unit/mL  500 Units Intravenous Once Tevan Marian C, MD      . sodium chloride flush (NS) 0.9 % injection 10 mL  10 mL Intravenous PRN Lequita Asal, MD   10 mL at 11/11/19  0812    Review of Systems  Constitutional: Negative for chills, diaphoresis, fever, malaise/fatigue and weight loss (up 1 lb).  HENT: Negative for congestion, ear discharge, ear pain, hearing loss, nosebleeds, sinus pain, sore throat and tinnitus.   Eyes: Negative.  Negative for blurred vision and double vision.  Respiratory: Positive for sputum production (phlegm) and shortness of breath (on exertion, stable). Negative for cough and hemoptysis.   Cardiovascular: Negative.  Negative for chest pain, palpitations and leg swelling.  Gastrointestinal: Negative for abdominal pain, blood in stool, constipation, diarrhea, heartburn, melena, nausea and vomiting.  Genitourinary: Negative.  Negative for dysuria, frequency, hematuria and urgency.  Musculoskeletal: Negative.  Negative for back pain, joint pain, myalgias and neck pain.  Skin: Negative for itching and rash.  Neurological: Positive for sensory change (burning pain in left hand and arm). Negative for dizziness, tingling, weakness and headaches.   Endo/Heme/Allergies: Negative.   Psychiatric/Behavioral: Negative for depression, memory loss and substance abuse (denies drug and alcohol use). The patient has insomnia (due to arm pain). The patient is not nervous/anxious.   All other systems reviewed and are negative.  Performance status (ECOG): 1  Vitals Blood pressure (!) 144/103, pulse (!) 131, temperature 97.8 F (36.6 C), temperature source Oral, resp. rate 16, weight 134 lb 2.4 oz (60.9 kg), SpO2 98 %.   Physical Exam Vitals and nursing note reviewed.  Constitutional:      General: He is not in acute distress.    Appearance: Normal appearance. He is not ill-appearing, toxic-appearing or diaphoretic.     Interventions: Face mask in place.  HENT:     Head: Normocephalic and atraumatic.     Comments: Albertina Parr.    Mouth/Throat:     Mouth: Mucous membranes are moist.     Pharynx: No oropharyngeal exudate or posterior oropharyngeal erythema.  Eyes:     General: No scleral icterus.    Extraocular Movements: Extraocular movements intact.     Conjunctiva/sclera: Conjunctivae normal.     Pupils: Pupils are equal, round, and reactive to light.     Comments: Blue eyes.  Cardiovascular:     Rate and Rhythm: Regular rhythm. Tachycardia present.     Heart sounds: No murmur heard. No gallop.   Pulmonary:     Effort: Pulmonary effort is normal. No respiratory distress.     Breath sounds: No wheezing or rales.  Chest:     Chest wall: No tenderness.  Breasts:     Right: No axillary adenopathy or supraclavicular adenopathy.     Left: No axillary adenopathy or supraclavicular adenopathy.    Abdominal:     General: Bowel sounds are normal. There is no distension.     Palpations: Abdomen is soft. There is no hepatomegaly, splenomegaly or mass.     Tenderness: There is no abdominal tenderness. There is no guarding or rebound.  Musculoskeletal:        General: No swelling or tenderness. Normal range of motion.     Cervical  back: Normal range of motion.     Right lower leg: No edema.     Left lower leg: No edema.  Lymphadenopathy:     Head:     Right side of head: No preauricular, posterior auricular or occipital adenopathy.     Left side of head: No preauricular, posterior auricular or occipital adenopathy.     Cervical: No cervical adenopathy.     Upper Body:     Right upper body: No supraclavicular or axillary  adenopathy.     Left upper body: No supraclavicular or axillary adenopathy.     Lower Body: No right inguinal adenopathy. No left inguinal adenopathy.  Skin:    General: Skin is warm and dry.     Findings: No bruising, erythema or rash.  Neurological:     Mental Status: He is alert and oriented to person, place, and time.     Sensory: No sensory deficit.     Motor: No weakness.     Comments: Left hand weakness.  Psychiatric:        Mood and Affect: Mood normal.        Behavior: Behavior normal.        Thought Content: Thought content normal.        Judgment: Judgment normal.     Imaging studies: 08/19/2019:  Chest CT without contrast revealed the left pleural pigtail drain. There was a small residual left hydropneumothorax. There was a 6.7 x 4 cm medial basilar left lower lobe lung masssuspicious for primary bronchogenic carcinoma. There was a lingular 1.0 cm solid pulmonary nodulesuspicious for ipsilateral metastasis. There was intralobular septal thickening and patchy groundglass opacity throughout the lingula and the left lower lobe with moderate residual atelectasis of the left lung base. A component of lymphangitic carcinomatosis was suspected. There was ipsilateral infra hilar, subcarinal, AP window, prevascular and upper retroperitoneal adenopathyc/wmetastatic disease. 08/21/2019:  Head MRI revealed no evidence of metastatic disease. 08/29/2019:  PET scan revealed markedly hypermetabolic left lower lobe pulmonary lesion (SUV 7.9) c/w the patient's known malignancy. There was  associated hypermetabolic nodal metastases in the mediastinum and left hilum with metastatic involvement of left pleural space. Additional sites included upper abdominal hypermetabolic lymphadenopathy and scattered hypermetabolic bone metastases (left acetabulum, sacrum, and L5). There was interval decrease in left pleural effusion with areas of loculation. 10/11/2019:  Chest, abdomen, and pelvis CT revealed considerable reduction in size of prior hypermetabolic lymph nodes in the chest and abdomen, compatible with response to therapy. There was notably reduced size of the left pleural effusion although some complexity or loculation persists. The previous areas of hypermetabolic nodularity along the pleural surface were not obviously nodular along the pleural surface. There was considerable reduction in size of the previously consolidated/collapsed left lower lobe, although there was still some residual indistinct opacity in this region. The previous hypermetabolic osseous metastatic lesions were relatively occult.  There was mild airway thickening was present, suggesting bronchitis or reactive airways disease. There was trace pelvic ascites, improved from prior. There was chronic deformity of the left medial clavicle likely due to an old fracture with some deformity of the left sternoclavicular joint. 11/23/2019: Chest CT angiogram revealed no pulmonary embolus.  There was new patchy and ground-glass opacity in the dependent right lower lobe, perifissural right middle and right upper lobe. Findings were suspicious for pneumonia, including aspiration or atypical viral organisms such as COVID-19. There was a small left pleural effusion with slight increase size from prior exam. Volume loss in the left lower lobe with areas of ill-defined consolidation, was not significantly changed from prior. Right hilar adenopathy was new from prior exam, nonspecific in this setting. Recommend attention at follow-up. There was  debris within the trachea and to a lesser extent right mainstem bronchus. There was right lower lobe bronchial thickening with areas of mucous plugging.  11/29/2019:  Abdomen and pelvis CT revealed residual patchy nodular foci of consolidation at the right lung base, overall decreased since 11/22/2019 favoring resolving bronchopneumonia.  Follow-up  chest CT was advised.  There was a stable small dependent left pleural effusion and stable top normal size previously hypermetabolic left periaortic lymph node.  There was stable faint sclerotic left pelvic bone lesions.  There were no new or progressive metastatic disease in the abdomen or pelvis. 03/09/2020:  CXR showed left base atelectasis with small left pleural effusion, resent on prior CT examination. There was no new opacity evident.  There was a stable cardiac silhouette. There was no adenopathy evident. Port-A-Cath tip was at the cavoatrial junction. 03/10/2020:  Head MRI with and without contrast revealed no evidence of intracranial metastatic disease. There were remote small infarcts in the left corona radiata and left thalamus, unchanged. There was stable moderate white matter disease, likely representing chronic small vessel ischemic disease. There were bilateral mastoid effusions. 03/10/2020:  Cervical spine MRI with and without contrast revealed no evidence of metastatic disease to the cervical spine. There were multilevel degenerative changes of the cervical spine, worst at C5-C6 where there was moderate spinal canal stenosis and severe bilateral neural foraminal narrowing. There was severe right neural foraminal narrowing at C4-5. There was moderate to severe left neural foraminal narrowing at C3-4. 03/19/2020:  PET scan revealed mild patchy left lower lobe opacity, stable versus mildly improved, without appreciable hypermetabolism on PET. While a small focus of residual tumor was difficult to entirely exclude, for the most part this favored treated  disease.  There were no findings suspicious for metastatic disease.   Appointment on 03/23/2020  Component Date Value Ref Range Status  . WBC 03/23/2020 7.1  4.0 - 10.5 K/uL Final  . RBC 03/23/2020 4.39  4.22 - 5.81 MIL/uL Final  . Hemoglobin 03/23/2020 14.8  13.0 - 17.0 g/dL Final  . HCT 03/23/2020 40.9  39.0 - 52.0 % Final  . MCV 03/23/2020 93.2  80.0 - 100.0 fL Final  . MCH 03/23/2020 33.7  26.0 - 34.0 pg Final  . MCHC 03/23/2020 36.2* 30.0 - 36.0 g/dL Final  . RDW 03/23/2020 13.0  11.5 - 15.5 % Final  . Platelets 03/23/2020 202  150 - 400 K/uL Final  . nRBC 03/23/2020 0.0  0.0 - 0.2 % Final  . Neutrophils Relative % 03/23/2020 73  % Final  . Neutro Abs 03/23/2020 5.1  1.7 - 7.7 K/uL Final  . Lymphocytes Relative 03/23/2020 15  % Final  . Lymphs Abs 03/23/2020 1.1  0.7 - 4.0 K/uL Final  . Monocytes Relative 03/23/2020 11  % Final  . Monocytes Absolute 03/23/2020 0.8  0.1 - 1.0 K/uL Final  . Eosinophils Relative 03/23/2020 0  % Final  . Eosinophils Absolute 03/23/2020 0.0  0.0 - 0.5 K/uL Final  . Basophils Relative 03/23/2020 0  % Final  . Basophils Absolute 03/23/2020 0.0  0.0 - 0.1 K/uL Final  . Immature Granulocytes 03/23/2020 1  % Final  . Abs Immature Granulocytes 03/23/2020 0.10* 0.00 - 0.07 K/uL Final   Performed at [facility], [address], [place], [place] [address]    Assessment:  Scott Jordan is a 69 y.o. male with extensive stage small cell lung cancer. He has a >40 pack year smoking. He presented with a recurrent left sided pleural effusion. He has undergone thoracentesis x 2 in the past month. He is s/p pigtail drain. Initial cytology x 2 was negative (lymphocytic exudative effusion).Pleural fluid cytologyon 08/18/2019 confirmed small cell lung cancer.  Chest CT without contraston 08/19/2019 revealed the left pleural pigtail drain. There was a small  residual left hydropneumothorax. There was a 6.7 x 4 cm medial basilar left lower lobe  lung masssuspicious for primary bronchogenic carcinoma. There was a lingular 1.0 cm solid pulmonary nodulesuspicious for ipsilateral metastasis. There was intralobular septal thickening and patchy groundglass opacity throughout the lingula and the left lower lobe with moderate residual atelectasis of the left lung base. A component of lymphangitic carcinomatosis was suspected. There was ipsilateral infra hilar, subcarinal, AP window, prevascular and upper retroperitoneal adenopathyc/wmetastatic disease.  Head MRI on 08/21/2019 revealed no evidence of metastatic disease.  PET scan on 05/20/20201 revealed markedly hypermetabolic left lower lobe pulmonary lesion (SUV 7.9) c/w the patient's known malignancy. There was associated hypermetabolic nodal metastases in the mediastinum and left hilum with metastatic involvement of left pleural space. Additional sites included upper abdominal hypermetabolic lymphadenopathy and scattered hypermetabolic bone metastases (left acetabulum, sacrum, and L5). There was interval decrease in left pleural effusion with areas of loculation.  CEA was 3.7 on 09/02/2019.  LDH was 206 on 09/23/2019.  He is s/p 4 cycles of carboplatin, etoposide, and Tecentriq (09/02/2019- 11/11/2019) with Margarette Canada support.  He is s/p 4 cycles of maintenance Tecentriq (12/02/2019 - 02/17/2020).  He received PCI from 01/09/2020 - 01/22/2020.   Head MRI on 03/10/2020 revealed no evidence of intracranial metastatic disease. There were remote small infarcts in the left corona radiata and left thalamus, unchanged. There was stable moderate white matter disease, likely representing chronic small vessel ischemic disease. There were bilateral mastoid effusions.  Cervical spine MRI on 03/10/2020 revealed no evidence of metastatic disease to the cervical spine. There were multilevel degenerative changes of the cervical spine, worst at C5-C6 where there was moderate spinal canal stenosis and severe  bilateral neural foraminal narrowing. There was severe right neural foraminal narrowing at C4-5. There was moderate to severe left neural foraminal narrowing at C3-4.  PET scan on 03/19/2020 revealed mild patchy left lower lobe opacity, stable versus mildly improved, without appreciable hypermetabolism on PET. While a small focus of residual tumor was difficult to entirely exclude, for the most part this favored treated disease.  There were no findings suspicious for metastatic disease.   He was admitted to [facility] from 11/22/2019 - 11/25/2019 with a heroin overdose and aspiration pneumonia.  He received 1 unit of PRBCs.  He was treated with Zosyn.  He was discharged on Augmentin x5 days.  He has hyponatremiasecondary to SIADH.  He began sodium chloride tablets (1 gm TID) on 09/04/2019.  He has elevated LFTs. Hepatitis C antibody and hepatitis B core antibody total were positive on 12/23/2019.  Hepatitis B surface antigen, hepatitis B core IgM and hepatitis A IgM were negative.  Hepatitis B DNA was not detected. Hepatitis C RNA quantitative revealed 3.869 log 10 IU/mL (7400 IU/mL).  Hepatitis C genotype is 2b.  He has a significant family history of malignancy. A family member has had genetic testing (negative for Lynch syndrome).  Symptomatically, he describes a bad burning pain in his left hand. The pain startedin his elbow and has spread to his entire left hand. He cannot pick up or hold anything with his left hand. He has been using a heating pad. If he keeps his arm bent, the pain is better, but when he straightens his elbow he feels a shocking sensation. His left fingers are a little bit numb.  Plan: 1.   Labs today:  CBC with diff, CMP, TSH. 2. Metastatic small cell lung cancer Clinically, he denies any respiratory symptoms. He is s/p 4  cycles of carboplatin, etoposide and Tecentriq (last 11/11/2019). Chest CT angiogram plus abdomen and pelvis CT in  08/2021revealed adramatic response to treatment. He completed PCI on 01/22/2020.             He iss/p4 cycles of maintenance Tecentriq (last  02/17/2020). Cycle #2 maintenance Tecentriq was held secondary to increased LFTs on 12/30/2019. No evidence for Tecentriq induced hepatitis.                         Etiology felt secondary to possible alcohol or less likely hepatitis C.             Tecentriq has been held secondary to unexplained neuropathy in his left upper extremity. PET scan on 03/19/2020 was personally reviewed.  Agree with radiology findings.     No clear evidence of recurrent disease.  Anticipate restarting Tecentriq once left upper extremity symptoms have been addressed.             Discuss symptom management.  He has antiemetics at home to use on a prn bases.  Interventions are adequate.    3. Hyponatremia Sodium130.  Continue salt tablets and fluid restrictions.  Continue to monitor. 4. History of atrial fibrillation/flutter Continue Eliquis.  He denies any bruising or bleeding. 5. Upper extremity neurologic symptoms Left-sided symptoms appear related to possible nerve entrapment at the elbow. Right-sided symptoms appear at the wrist.  Head MRI on 03/10/2020 revealed no evidence of intracranial metastatic disease.   Cervical spine MRI with and without contrast on 03/10/2020 was personally reviewed.  Agree with radiology findings.     No evidence of metastatic disease.    There were multilevel degenerative changes of the cervical spine, worst at C5-C6, with moderate spinal canal stenosis and severe bilateral neuroforaminal narrowing.   There was severe right neural foraminal narrowing at C4-5.    There was moderate to severe left neural foraminal narrowing at C3-4.  Contact Dr. Manuella Ghazi regarding left upper extremity pain and  weakness - done.   The patient will be seen today in clinic  Patient has a consult with Dr. Izora Ribas of neurosurgery on 03/27/2020. 6.   Hepatitis C             Follow-up with Dr. Allen Norris. 7.   Hypertension and tachycardia  Blood pressure is 144/103 with a pulse of 131.  Concern for atrial fibrillation/flutter.    Patient has been compliant with his medications.    Dr Ubaldo Glassing on phone re:  HTN and tachycardia.  Patient will be seen in clinic tomorrow. 8.   RTC in 1-2 weeks for MD assessment, labs (CBC with diff, CMP, Mg), and Tecentriq.  I discussed the assessment and treatment plan with the patient.  The patient was provided an opportunity to ask questions and all were answered.  The patient agreed with the plan and demonstrated an understanding of the instructions.  The patient was advised to call back if the symptoms worsen or if the condition fails to improve as anticipated.   Lequita Asal, MD, PhD    03/23/2020, 1:41 PM  I, Mirian Mo Tufford, am acting as Education administrator for [employer]. Mike Gip, MD, PhD.  I, Aline Wesche C. Mike Gip, MD, have reviewed the above documentation for accuracy and completeness, and I agree with the above.

## 2020-03-22 DIAGNOSIS — G5692 Unspecified mononeuropathy of left upper limb: Secondary | ICD-10-CM | POA: Insufficient documentation

## 2020-03-23 ENCOUNTER — Other Ambulatory Visit: Payer: Self-pay | Admitting: Hematology and Oncology

## 2020-03-23 ENCOUNTER — Other Ambulatory Visit: Payer: Self-pay

## 2020-03-23 ENCOUNTER — Encounter: Payer: Self-pay | Admitting: Hematology and Oncology

## 2020-03-23 ENCOUNTER — Other Ambulatory Visit: Payer: Medicare Other

## 2020-03-23 ENCOUNTER — Inpatient Hospital Stay: Payer: Medicare Other | Attending: Hematology and Oncology | Admitting: Hematology and Oncology

## 2020-03-23 ENCOUNTER — Inpatient Hospital Stay: Payer: Medicare Other

## 2020-03-23 ENCOUNTER — Ambulatory Visit: Payer: Medicare Other

## 2020-03-23 VITALS — BP 144/103 | HR 131 | Temp 97.8°F | Resp 16 | Wt 134.2 lb

## 2020-03-23 DIAGNOSIS — B182 Chronic viral hepatitis C: Secondary | ICD-10-CM

## 2020-03-23 DIAGNOSIS — I4891 Unspecified atrial fibrillation: Secondary | ICD-10-CM | POA: Insufficient documentation

## 2020-03-23 DIAGNOSIS — C3490 Malignant neoplasm of unspecified part of unspecified bronchus or lung: Secondary | ICD-10-CM | POA: Diagnosis not present

## 2020-03-23 DIAGNOSIS — G252 Other specified forms of tremor: Secondary | ICD-10-CM | POA: Diagnosis not present

## 2020-03-23 DIAGNOSIS — I484 Atypical atrial flutter: Secondary | ICD-10-CM | POA: Diagnosis not present

## 2020-03-23 DIAGNOSIS — C3432 Malignant neoplasm of lower lobe, left bronchus or lung: Secondary | ICD-10-CM | POA: Diagnosis not present

## 2020-03-23 DIAGNOSIS — E871 Hypo-osmolality and hyponatremia: Secondary | ICD-10-CM | POA: Diagnosis not present

## 2020-03-23 DIAGNOSIS — G5692 Unspecified mononeuropathy of left upper limb: Secondary | ICD-10-CM

## 2020-03-23 DIAGNOSIS — E538 Deficiency of other specified B group vitamins: Secondary | ICD-10-CM | POA: Diagnosis not present

## 2020-03-23 DIAGNOSIS — K759 Inflammatory liver disease, unspecified: Secondary | ICD-10-CM | POA: Insufficient documentation

## 2020-03-23 DIAGNOSIS — E531 Pyridoxine deficiency: Secondary | ICD-10-CM | POA: Diagnosis not present

## 2020-03-23 DIAGNOSIS — E559 Vitamin D deficiency, unspecified: Secondary | ICD-10-CM | POA: Diagnosis not present

## 2020-03-23 DIAGNOSIS — R634 Abnormal weight loss: Secondary | ICD-10-CM | POA: Insufficient documentation

## 2020-03-23 DIAGNOSIS — R202 Paresthesia of skin: Secondary | ICD-10-CM | POA: Diagnosis not present

## 2020-03-23 DIAGNOSIS — I10 Essential (primary) hypertension: Secondary | ICD-10-CM | POA: Diagnosis not present

## 2020-03-23 DIAGNOSIS — R945 Abnormal results of liver function studies: Secondary | ICD-10-CM | POA: Insufficient documentation

## 2020-03-23 DIAGNOSIS — Z7901 Long term (current) use of anticoagulants: Secondary | ICD-10-CM | POA: Diagnosis not present

## 2020-03-23 DIAGNOSIS — R200 Anesthesia of skin: Secondary | ICD-10-CM | POA: Diagnosis not present

## 2020-03-23 DIAGNOSIS — E519 Thiamine deficiency, unspecified: Secondary | ICD-10-CM | POA: Diagnosis not present

## 2020-03-23 LAB — CBC WITH DIFFERENTIAL/PLATELET
Abs Immature Granulocytes: 0.1 10*3/uL — ABNORMAL HIGH (ref 0.00–0.07)
Basophils Absolute: 0 10*3/uL (ref 0.0–0.1)
Basophils Relative: 0 %
Eosinophils Absolute: 0 10*3/uL (ref 0.0–0.5)
Eosinophils Relative: 0 %
HCT: 40.9 % (ref 39.0–52.0)
Hemoglobin: 14.8 g/dL (ref 13.0–17.0)
Immature Granulocytes: 1 %
Lymphocytes Relative: 15 %
Lymphs Abs: 1.1 10*3/uL (ref 0.7–4.0)
MCH: 33.7 pg (ref 26.0–34.0)
MCHC: 36.2 g/dL — ABNORMAL HIGH (ref 30.0–36.0)
MCV: 93.2 fL (ref 80.0–100.0)
Monocytes Absolute: 0.8 10*3/uL (ref 0.1–1.0)
Monocytes Relative: 11 %
Neutro Abs: 5.1 10*3/uL (ref 1.7–7.7)
Neutrophils Relative %: 73 %
Platelets: 202 10*3/uL (ref 150–400)
RBC: 4.39 MIL/uL (ref 4.22–5.81)
RDW: 13 % (ref 11.5–15.5)
WBC: 7.1 10*3/uL (ref 4.0–10.5)
nRBC: 0 % (ref 0.0–0.2)

## 2020-03-23 LAB — COMPREHENSIVE METABOLIC PANEL
ALT: 18 U/L (ref 0–44)
AST: 23 U/L (ref 15–41)
Albumin: 3.6 g/dL (ref 3.5–5.0)
Alkaline Phosphatase: 74 U/L (ref 38–126)
Anion gap: 13 (ref 5–15)
BUN: 12 mg/dL (ref 8–23)
CO2: 24 mmol/L (ref 22–32)
Calcium: 8.8 mg/dL — ABNORMAL LOW (ref 8.9–10.3)
Chloride: 93 mmol/L — ABNORMAL LOW (ref 98–111)
Creatinine, Ser: 0.76 mg/dL (ref 0.61–1.24)
GFR, Estimated: >60 mL/min (ref >60)
Glucose, Bld: 110 mg/dL — ABNORMAL HIGH (ref 70–99)
Potassium: 3.8 mmol/L (ref 3.5–5.1)
Sodium: 130 mmol/L — ABNORMAL LOW (ref 135–145)
Total Bilirubin: 1.2 mg/dL (ref 0.3–1.2)
Total Protein: 7.6 g/dL (ref 6.5–8.1)

## 2020-03-23 LAB — MAGNESIUM: Magnesium: 1.6 mg/dL — ABNORMAL LOW (ref 1.7–2.4)

## 2020-03-23 LAB — TSH: TSH: 0.81 u[IU]/mL (ref 0.350–4.500)

## 2020-03-23 NOTE — Progress Notes (Signed)
Left hand pain and numbness x1 month. Has gotten worse over time. Started in elbow and has worked its way to hand.

## 2020-03-23 NOTE — Progress Notes (Signed)
Pt c/o pain and numbness in his hands, no tx today, pt is on his way to neurologist in [place], port d/ced

## 2020-03-24 ENCOUNTER — Telehealth: Payer: Self-pay

## 2020-03-24 DIAGNOSIS — I483 Typical atrial flutter: Secondary | ICD-10-CM | POA: Diagnosis not present

## 2020-03-24 DIAGNOSIS — I4892 Unspecified atrial flutter: Secondary | ICD-10-CM | POA: Diagnosis not present

## 2020-03-24 DIAGNOSIS — I10 Essential (primary) hypertension: Secondary | ICD-10-CM | POA: Diagnosis not present

## 2020-03-24 NOTE — Telephone Encounter (Signed)
Patient was dropped off at the med center by uber, patient became dizzy and fell getting out of the car. Volunteer brought patient into cancer center by wheel chair. Checked patients BP:  10:17 - 143/100 HR 139 10:19 - 127/86 HR 88 10:21 - 137/85 HR 85  Patient was trying to get to [facility] clinic for cardiology appt but the uber brought him to the wrong location. Called [facility] to let them know and that he would be coming to their office via uber white lexus SUV and that he fell and that patient was assessed by Dr Mike Gip and she said that he was in [place]. [place] to meet patient at [facility] with wheelchair to bring him inside. Patient states that he had not eaten breakfast, gave him crackers and ginger ale

## 2020-03-27 DIAGNOSIS — M5412 Radiculopathy, cervical region: Secondary | ICD-10-CM | POA: Diagnosis not present

## 2020-03-27 DIAGNOSIS — G5622 Lesion of ulnar nerve, left upper limb: Secondary | ICD-10-CM | POA: Diagnosis not present

## 2020-04-06 ENCOUNTER — Inpatient Hospital Stay: Payer: Medicare Other

## 2020-04-06 ENCOUNTER — Inpatient Hospital Stay: Payer: Medicare Other | Admitting: Hematology and Oncology

## 2020-04-06 ENCOUNTER — Other Ambulatory Visit: Payer: Self-pay | Admitting: Hematology and Oncology

## 2020-04-06 DIAGNOSIS — C3490 Malignant neoplasm of unspecified part of unspecified bronchus or lung: Secondary | ICD-10-CM

## 2020-04-06 DIAGNOSIS — E538 Deficiency of other specified B group vitamins: Secondary | ICD-10-CM | POA: Insufficient documentation

## 2020-04-08 ENCOUNTER — Telehealth: Payer: Self-pay | Admitting: Hematology and Oncology

## 2020-04-09 ENCOUNTER — Telehealth: Payer: Self-pay | Admitting: Hematology and Oncology

## 2020-04-13 ENCOUNTER — Ambulatory Visit: Payer: Medicare Other | Admitting: Gastroenterology

## 2020-04-14 ENCOUNTER — Inpatient Hospital Stay: Payer: Medicare Other | Admitting: Hematology and Oncology

## 2020-04-14 ENCOUNTER — Other Ambulatory Visit: Payer: Medicare Other

## 2020-04-14 ENCOUNTER — Ambulatory Visit: Payer: Medicare Other

## 2020-04-14 NOTE — Progress Notes (Incomplete)
[facility]  [address], [address] [place], [place] [address] Phone: [phone]  Fax: [phone]   Clinic Day:  04/14/20  Referring physician: No ref. provider found  Chief Complaint: Scott Jordan is a 70 y.o. male with extensive stage small cell lung cancer who is seen for 3 week assessment.  HPI: The patient was last seen in the medical oncology clinic on 03/23/2020. At that time, he felt "alright".  He decribed a significant burning sensation in his left hand; pain began in his elbow and extended to his hand.  He could not pick up or hold anything in his left hand.  He experienced a shock-like sensation on extending his arm at the elbow.  Hematocrit was 40.9, hemoglobin 14.8, platelets 202,000, WBC 7,100. Sodium was 130. Chloride was 93. Calcium was 8.8. Magnesium was 1.6. TSH was 0.810. He was to see neurology. Chemotherapy was held due to upper extremity symptoms.  The patient was seen in consultation by Dr. Manuella Ghazi on 03/23/2020.  He was noted to have left medial elbow pain with radiation to hand (burning, shocking) + subjective hand weakness and paresthesia.  Laboratory work-up included the following: Hgb A1C (5.3%), sed rate (2), SPEP (polyclonal increase), B12 (227; low), folate (17.1), B1 (136), B6 (25.9), and vitamin D, 25-hydroxy (23.9- low).   An EMG was scheduled.  He began a daily steroid dose taper (prednisone 60 mg, 50 mg, 40 mg, 30 mg, 20 mg, 10 mg, then stop).  He was started on gabapentin 300 mg at night x 1 week then increased to 300 mg BID.  He has a follow-up around 08/21/2020.   He was contacted by Dr Trena Platt office on 03/31/2020 to begin B12 1000 mcg a day and vitamin D3 1000 units a day.  The patient saw Dr. Ubaldo Glassing on 03/24/2020. His pulse was 106 in clinic. He denied chest pain or shortness of breath. He had mild dizziness when he stood up. He was felt to have typical atrial flutter with a rapid rate.  There was concern about noncompliance with  medications.  The decision was made to add diltiazem 30 mg TID. He continued Eliquis and Cardizem 240 mg. Follow up was planned for 4 weeks (around 04/21/2020).  The patient saw Dr. Izora Ribas on 03/27/2020. He reported pain and numbness radiating down his left arm into his hand. Pain was worse in his medial left forearm and into his 4th and 5th digit.  He had numbness in his 1st through 3rd digit as well.  He had trouble opening jars and reaching.  He was felt to have a possible left-sided C6 radiculopathy as well as ulnar neuropathy.  They discussed doing a nerve conduction study and a referral to physical therapy. If he has ulnar neuropathy, he will be referred to Dr. Lacinda Axon.  During the interim, ***   Past Medical History:  Diagnosis Date  . A-fib ([place]) 01/10/2019   pt st this was dx by Dr. Ubaldo Glassing  . Cancer ([place]) 2021   LUNG  . Complication of anesthesia    DIFFICULTY WAKING UP AFTER SURGERY- 20 YRS AGO  . Hypertension   . Lung cancer ([place])   . Substance abuse [facility])     Past Surgical History:  Procedure Laterality Date  . ARM DEBRIDEMENT Left    INCISION AND DEBRIDEMENT LOWER ARM -20 YRS AGO  . BACK SURGERY    . PORTACATH PLACEMENT Left 08/29/2019   Procedure: INSERTION PORT-A-CATH;  Surgeon: Nestor Lewandowsky, MD;  Location:  [facility] ORS;  Service: General;  Laterality: Left;    No family history on file.  Social History:  reports that he has quit smoking. His smoking use included cigarettes. He smoked 0.50 packs per day. He has never used smokeless tobacco. He reports current alcohol use. He reports previous drug use. Drug: Heroin. The patient denies any exposure to radiation or toxins. The patient lives in [place]. He is dependent on public transportation or transportation from friends/family. He has family friends staying with him to help care for his needs.His niece, Scott Jordan [phone]), is his medical power of attorney. The patient is alone*** today.    Allergies: No Known  Allergies  Current Medications: Current Outpatient Medications  Medication Sig Dispense Refill  . calcium carbonate (OS-CAL) 600 MG TABS tablet Take 600 mg by mouth daily with breakfast.    . dexamethasone (DECADRON) 4 MG tablet Take 1 tablet (4 mg total) by mouth daily. 25 tablet 0  . diltiazem (CARDIZEM CD) 240 MG 24 hr capsule Take 1 capsule (240 mg total) by mouth daily. 90 capsule 0  . ELIQUIS 5 MG TABS tablet Take 5 mg by mouth 2 (two) times daily.    Marland Kitchen escitalopram (LEXAPRO) 10 MG tablet Take 1 tablet (10 mg total) by mouth daily. 90 tablet 0  . feeding supplement, ENSURE ENLIVE, (ENSURE ENLIVE) LIQD Take 237 mLs by mouth 3 (three) times daily between meals. 237 mL 12  . furosemide (LASIX) 20 MG tablet Take 1 tablet (20 mg total) by mouth daily. 20 tablet 1  . HYDROcodone-acetaminophen (NORCO/VICODIN) 5-325 MG tablet Take 1 tablet by mouth every 6 (six) hours as needed for moderate pain. (Patient not taking: Reported on 03/23/2020) 30 tablet 0  . lidocaine-prilocaine (EMLA) cream Apply to affected area once 30 g 3  . Magnesium 500 MG CAPS Take 500 mg by mouth daily.    . metoprolol succinate (TOPROL-XL) 50 MG 24 hr tablet Take 1 tablet (50 mg total) by mouth daily. Take with or immediately following a meal. 90 tablet 0  . nystatin (MYCOSTATIN) 100000 UNIT/ML suspension Use as directed 5 mLs (500,000 Units total) in the mouth or throat 4 (four) times daily. Swish and spit (Patient not taking: Reported on 03/23/2020) 60 mL 0  . ondansetron (ZOFRAN) 8 MG tablet Take 1 tablet (8 mg total) by mouth every 8 (eight) hours as needed for refractory nausea / vomiting. Start on day 3 after carboplatin chemo. (Patient not taking: Reported on 03/23/2020) 30 tablet 1  . potassium chloride SA (KLOR-CON) 20 MEQ tablet TAKE 1 TABLET BY MOUTH DAILY FOR 3 DAYS (Patient taking differently: Take 20 mEq by mouth daily.) 3 tablet 0  . sodium chloride 1 g tablet Take 1 tablet (1 g total) by mouth 3 (three) times  daily with meals. 90 tablet 1   No current facility-administered medications for this visit.   Facility-Administered Medications Ordered in Other Visits  Medication Dose Route Frequency Provider Last Rate Last Admin  . heparin lock flush 100 unit/mL  500 Units Intravenous Once Corcoran, Melissa C, MD      . sodium chloride flush (NS) 0.9 % injection 10 mL  10 mL Intravenous PRN Lequita Asal, MD   10 mL at 11/11/19 1194    Review of Systems  Constitutional: Negative for chills, diaphoresis, fever, malaise/fatigue and weight loss (up 1 lb).  HENT: Negative for congestion, ear discharge, ear pain, hearing loss, nosebleeds, sinus pain, sore throat and tinnitus.   Eyes: Negative.  Negative for blurred vision and double vision.  Respiratory: Positive for sputum production (phlegm) and shortness of breath (on exertion, stable). Negative for cough and hemoptysis.   Cardiovascular: Negative.  Negative for chest pain, palpitations and leg swelling.  Gastrointestinal: Negative for abdominal pain, blood in stool, constipation, diarrhea, heartburn, melena, nausea and vomiting.  Genitourinary: Negative.  Negative for dysuria, frequency, hematuria and urgency.  Musculoskeletal: Negative.  Negative for back pain, joint pain, myalgias and neck pain.  Skin: Negative for itching and rash.  Neurological: Positive for sensory change (burning pain in left hand and arm). Negative for dizziness, tingling, weakness and headaches.  Endo/Heme/Allergies: Negative.   Psychiatric/Behavioral: Negative for depression, memory loss and substance abuse (denies drug and alcohol use). The patient has insomnia (due to arm pain). The patient is not nervous/anxious.   All other systems reviewed and are negative.  Performance status (ECOG): 1***  Vitals There were no vitals taken for this visit.   Physical Exam Vitals and nursing note reviewed.  Constitutional:      General: He is not in acute distress.     Appearance: Normal appearance. He is not ill-appearing, toxic-appearing or diaphoretic.     Interventions: Face mask in place.  HENT:     Head: Normocephalic and atraumatic.     Comments: Albertina Parr.    Mouth/Throat:     Mouth: Mucous membranes are moist.     Pharynx: No oropharyngeal exudate or posterior oropharyngeal erythema.  Eyes:     General: No scleral icterus.    Extraocular Movements: Extraocular movements intact.     Conjunctiva/sclera: Conjunctivae normal.     Pupils: Pupils are equal, round, and reactive to light.     Comments: Blue eyes.  Cardiovascular:     Rate and Rhythm: Regular rhythm. Tachycardia present.     Heart sounds: No murmur heard. No gallop.   Pulmonary:     Effort: Pulmonary effort is normal. No respiratory distress.     Breath sounds: No wheezing or rales.  Chest:     Chest wall: No tenderness.  Breasts:     Right: No axillary adenopathy or supraclavicular adenopathy.     Left: No axillary adenopathy or supraclavicular adenopathy.    Abdominal:     General: Bowel sounds are normal. There is no distension.     Palpations: Abdomen is soft. There is no hepatomegaly, splenomegaly or mass.     Tenderness: There is no abdominal tenderness. There is no guarding or rebound.  Musculoskeletal:        General: No swelling or tenderness. Normal range of motion.     Cervical back: Normal range of motion.     Right lower leg: No edema.     Left lower leg: No edema.  Lymphadenopathy:     Head:     Right side of head: No preauricular, posterior auricular or occipital adenopathy.     Left side of head: No preauricular, posterior auricular or occipital adenopathy.     Cervical: No cervical adenopathy.     Upper Body:     Right upper body: No supraclavicular or axillary adenopathy.     Left upper body: No supraclavicular or axillary adenopathy.     Lower Body: No right inguinal adenopathy. No left inguinal adenopathy.  Skin:    General: Skin is warm and  dry.     Findings: No bruising, erythema or rash.  Neurological:     Mental Status: He is alert and oriented to person, place,  and time.     Sensory: No sensory deficit.     Motor: No weakness.     Comments: Left hand weakness.  Psychiatric:        Mood and Affect: Mood normal.        Behavior: Behavior normal.        Thought Content: Thought content normal.        Judgment: Judgment normal.     Imaging studies: 08/19/2019:  Chest CT without contrast revealed the left pleural pigtail drain. There was a small residual left hydropneumothorax. There was a 6.7 x 4 cm medial basilar left lower lobe lung masssuspicious for primary bronchogenic carcinoma. There was a lingular 1.0 cm solid pulmonary nodulesuspicious for ipsilateral metastasis. There was intralobular septal thickening and patchy groundglass opacity throughout the lingula and the left lower lobe with moderate residual atelectasis of the left lung base. A component of lymphangitic carcinomatosis was suspected. There was ipsilateral infra hilar, subcarinal, AP window, prevascular and upper retroperitoneal adenopathyc/wmetastatic disease. 08/21/2019:  Head MRI revealed no evidence of metastatic disease. 08/29/2019:  PET scan revealed markedly hypermetabolic left lower lobe pulmonary lesion (SUV 7.9) c/w the patient's known malignancy. There was associated hypermetabolic nodal metastases in the mediastinum and left hilum with metastatic involvement of left pleural space. Additional sites included upper abdominal hypermetabolic lymphadenopathy and scattered hypermetabolic bone metastases (left acetabulum, sacrum, and L5). There was interval decrease in left pleural effusion with areas of loculation. 10/11/2019:  Chest, abdomen, and pelvis CT revealed considerable reduction in size of prior hypermetabolic lymph nodes in the chest and abdomen, compatible with response to therapy. There was notably reduced size of the left pleural effusion  although some complexity or loculation persists. The previous areas of hypermetabolic nodularity along the pleural surface were not obviously nodular along the pleural surface. There was considerable reduction in size of the previously consolidated/collapsed left lower lobe, although there was still some residual indistinct opacity in this region. The previous hypermetabolic osseous metastatic lesions were relatively occult.  There was mild airway thickening was present, suggesting bronchitis or reactive airways disease. There was trace pelvic ascites, improved from prior. There was chronic deformity of the left medial clavicle likely due to an old fracture with some deformity of the left sternoclavicular joint. 11/23/2019: Chest CT angiogram revealed no pulmonary embolus.  There was new patchy and ground-glass opacity in the dependent right lower lobe, perifissural right middle and right upper lobe. Findings were suspicious for pneumonia, including aspiration or atypical viral organisms such as COVID-19. There was a small left pleural effusion with slight increase size from prior exam. Volume loss in the left lower lobe with areas of ill-defined consolidation, was not significantly changed from prior. Right hilar adenopathy was new from prior exam, nonspecific in this setting. Recommend attention at follow-up. There was debris within the trachea and to a lesser extent right mainstem bronchus. There was right lower lobe bronchial thickening with areas of mucous plugging.  11/29/2019:  Abdomen and pelvis CT revealed residual patchy nodular foci of consolidation at the right lung base, overall decreased since 11/22/2019 favoring resolving bronchopneumonia.  Follow-up chest CT was advised.  There was a stable small dependent left pleural effusion and stable top normal size previously hypermetabolic left periaortic lymph node.  There was stable faint sclerotic left pelvic bone lesions.  There were no new or  progressive metastatic disease in the abdomen or pelvis. 03/09/2020:  CXR showed left base atelectasis with small left pleural effusion, resent on prior  CT examination. There was no new opacity evident.  There was a stable cardiac silhouette. There was no adenopathy evident. Port-A-Cath tip was at the cavoatrial junction. 03/10/2020:  Head MRI with and without contrast revealed no evidence of intracranial metastatic disease. There were remote small infarcts in the left corona radiata and left thalamus, unchanged. There was stable moderate white matter disease, likely representing chronic small vessel ischemic disease. There were bilateral mastoid effusions. 03/10/2020:  Cervical spine MRI with and without contrast revealed no evidence of metastatic disease to the cervical spine. There were multilevel degenerative changes of the cervical spine, worst at C5-C6 where there was moderate spinal canal stenosis and severe bilateral neural foraminal narrowing. There was severe right neural foraminal narrowing at C4-5. There was moderate to severe left neural foraminal narrowing at C3-4. 03/19/2020:  PET scan revealed mild patchy left lower lobe opacity, stable versus mildly improved, without appreciable hypermetabolism on PET. While a small focus of residual tumor was difficult to entirely exclude, for the most part this favored treated disease.  There were no findings suspicious for metastatic disease.   No visits with results within 3 Day(s) from this visit.  Latest known visit with results is:  Infusion on 03/23/2020  Component Date Value Ref Range Status  . TSH 03/23/2020 0.810  0.350 - 4.500 uIU/mL Final   Comment: Performed by a 3rd Generation assay with a functional sensitivity of <=0.01 uIU/mL. Performed at [facility], [address], [place], [place] [address]   . Magnesium 03/23/2020 1.6* 1.7 - 2.4 mg/dL Final   Performed at [facility], [address] [place],  [place] [address]  . Sodium 03/23/2020 130* 135 - 145 mmol/L Final  . Potassium 03/23/2020 3.8  3.5 - 5.1 mmol/L Final  . Chloride 03/23/2020 93* 98 - 111 mmol/L Final  . CO2 03/23/2020 24  22 - 32 mmol/L Final  . Glucose, Bld 03/23/2020 110* 70 - 99 mg/dL Final   Glucose reference range applies only to samples taken after fasting for at least 8 hours.  . BUN 03/23/2020 12  8 - 23 mg/dL Final  . Creatinine, Ser 03/23/2020 0.76  0.61 - 1.24 mg/dL Final  . Calcium 03/23/2020 8.8* 8.9 - 10.3 mg/dL Final  . Total Protein 03/23/2020 7.6  6.5 - 8.1 g/dL Final  . Albumin 03/23/2020 3.6  3.5 - 5.0 g/dL Final  . AST 03/23/2020 23  15 - 41 U/L Final  . ALT 03/23/2020 18  0 - 44 U/L Final  . Alkaline Phosphatase 03/23/2020 74  38 - 126 U/L Final  . Total Bilirubin 03/23/2020 1.2  0.3 - 1.2 mg/dL Final  . GFR, Estimated 03/23/2020 >60  >60 mL/min Final   Comment: (NOTE) Calculated using the CKD-EPI Creatinine Equation (2021)   . Anion gap 03/23/2020 13  5 - 15 Final   Performed at [facility], [address] [place],  [address]  . WBC 03/23/2020 7.1  4.0 - 10.5 K/uL Final  . RBC 03/23/2020 4.39  4.22 - 5.81 MIL/uL Final  . Hemoglobin 03/23/2020 14.8  13.0 - 17.0 g/dL Final  . HCT 03/23/2020 40.9  39.0 - 52.0 % Final  . MCV 03/23/2020 93.2  80.0 - 100.0 fL Final  . MCH 03/23/2020 33.7  26.0 - 34.0 pg Final  . MCHC 03/23/2020 36.2* 30.0 - 36.0 g/dL Final  . RDW 03/23/2020 13.0  11.5 - 15.5 % Final  . Platelets 03/23/2020 202  150 - 400  K/uL Final  . nRBC 03/23/2020 0.0  0.0 - 0.2 % Final  . Neutrophils Relative % 03/23/2020 73  % Final  . Neutro Abs 03/23/2020 5.1  1.7 - 7.7 K/uL Final  . Lymphocytes Relative 03/23/2020 15  % Final  . Lymphs Abs 03/23/2020 1.1  0.7 - 4.0 K/uL Final  . Monocytes Relative 03/23/2020 11  % Final  . Monocytes Absolute 03/23/2020 0.8  0.1 - 1.0 K/uL Final  . Eosinophils Relative 03/23/2020 0  % Final  . Eosinophils Absolute 03/23/2020 0.0  0.0 - 0.5  K/uL Final  . Basophils Relative 03/23/2020 0  % Final  . Basophils Absolute 03/23/2020 0.0  0.0 - 0.1 K/uL Final  . Immature Granulocytes 03/23/2020 1  % Final  . Abs Immature Granulocytes 03/23/2020 0.10* 0.00 - 0.07 K/uL Final   Performed at [facility], [address], [place], [place] [address]    Assessment:  Scott Jordan is a 70 y.o. male with extensive stage small cell lung cancer. He has a >40 pack year smoking. He presented with a recurrent left sided pleural effusion. He has undergone thoracentesis x 2 in the past month. He is s/p pigtail drain. Initial cytology x 2 was negative (lymphocytic exudative effusion).Pleural fluid cytologyon 08/18/2019 confirmed small cell lung cancer.  Chest CT without contraston 08/19/2019 revealed the left pleural pigtail drain. There was a small residual left hydropneumothorax. There was a 6.7 x 4 cm medial basilar left lower lobe lung masssuspicious for primary bronchogenic carcinoma. There was a lingular 1.0 cm solid pulmonary nodulesuspicious for ipsilateral metastasis. There was intralobular septal thickening and patchy groundglass opacity throughout the lingula and the left lower lobe with moderate residual atelectasis of the left lung base. A component of lymphangitic carcinomatosis was suspected. There was ipsilateral infra hilar, subcarinal, AP window, prevascular and upper retroperitoneal adenopathyc/wmetastatic disease.  Head MRI on 08/21/2019 revealed no evidence of metastatic disease.  PET scan on 05/20/20201 revealed markedly hypermetabolic left lower lobe pulmonary lesion (SUV 7.9) c/w the patient's known malignancy. There was associated hypermetabolic nodal metastases in the mediastinum and left hilum with metastatic involvement of left pleural space. Additional sites included upper abdominal hypermetabolic lymphadenopathy and scattered hypermetabolic bone metastases (left acetabulum, sacrum, and L5). There  was interval decrease in left pleural effusion with areas of loculation.  CEA was 3.7 on 09/02/2019.  LDH was 206 on 09/23/2019.  He is s/p 4 cycles of carboplatin, etoposide, and Tecentriq (09/02/2019- 11/11/2019) with Margarette Canada support.  He is s/p 4 cycles of maintenance Tecentriq (12/02/2019 - 02/17/2020).  He received PCI from 01/09/2020 - 01/22/2020.   Head MRI on 03/10/2020 revealed no evidence of intracranial metastatic disease. There were remote small infarcts in the left corona radiata and left thalamus, unchanged. There was stable moderate white matter disease, likely representing chronic small vessel ischemic disease. There were bilateral mastoid effusions.  Cervical spine MRI on 03/10/2020 revealed no evidence of metastatic disease to the cervical spine. There were multilevel degenerative changes of the cervical spine, worst at C5-C6 where there was moderate spinal canal stenosis and severe bilateral neural foraminal narrowing. There was severe right neural foraminal narrowing at C4-5. There was moderate to severe left neural foraminal narrowing at C3-4.  PET scan on 03/19/2020 revealed mild patchy left lower lobe opacity, stable versus mildly improved, without appreciable hypermetabolism on PET. While a small focus of residual tumor was difficult to entirely exclude, for the most part this favored treated disease.  There were no  findings suspicious for metastatic disease.   He was admitted to [facility] from 11/22/2019 - 11/25/2019 with a heroin overdose and aspiration pneumonia.  He received 1 unit of PRBCs.  He was treated with Zosyn.  He was discharged on Augmentin x5 days.  He has hyponatremiasecondary to SIADH.  He began sodium chloride tablets (1 gm TID) on 09/04/2019.  He has B12 deficiency.  B12 was 227 on 03/23/2020.  Folate was 17.1 on 03/23/2020.  He has left upper extremity symptoms c/w a left-sided C6 radiculopathy as well as ulnar neuropathy.  He has elevated LFTs. Hepatitis C  antibody and hepatitis B core antibody total were positive on 12/23/2019.  Hepatitis B surface antigen, hepatitis B core IgM and hepatitis A IgM were negative.  Hepatitis B DNA was not detected. Hepatitis C RNA quantitative revealed 3.869 log 10 IU/mL (7400 IU/mL).  Hepatitis C genotype is 2b.  He has a significant family history of malignancy. A family member has had genetic testing (negative for Lynch syndrome).  Symptomatically, ***  Plan: 1.   Labs today: CBC with diff, CMP, Mg.  2. Metastatic small cell lung cancer Clinically, he denies any respiratory symptoms. He is s/p 4 cycles of carboplatin, etoposide and Tecentriq (last 11/11/2019). Chest CT angiogram plus abdomen and pelvis CT in 08/2021revealed adramatic response to treatment. He completed PCI on 01/22/2020. He iss/p4cycles of maintenance Tecentriq (last 02/17/2020). Cycle #2 maintenance Tecentriq was held secondary to increased LFTs on 12/30/2019. No evidence for Tecentriq induced hepatitis. Etiology felt secondary to possible alcohol or less likely hepatitis C.  Tecentriq has been held secondary to unexplained neuropathy in his left upper extremity. PET scan on 03/19/2020 was personally reviewed.  Agree with radiology findings.                           No clear evidence of recurrent disease.             Anticipate restarting Tecentriq once left upper extremity symptoms have been addressed. Discuss symptom management.  He has antiemetics at home to use on a prn bases.  Interventions are adequate.    3. Hyponatremia Sodium130.  Continue salt tablets and fluid restrictions.  Continue to monitor. 4. History of atrial fibrillation/flutter Continue Eliquis. He denies any bruising or bleeding. 5. Upper  extremity neurologic symptoms Left-sided symptoms appear related to possible nerve entrapment at the elbow. Right-sided symptoms appear at the wrist.             Head MRI on 03/10/2020 revealed no evidence of intracranial metastatic disease.              Cervical spine MRI with and without contrast on 03/10/2020 was personally reviewed.  Agree with radiology findings.                           No evidence of metastatic disease.                          There were multilevel degenerative changes of the cervical spine, worst at C5-C6, with moderate spinal canal stenosis and severe bilateral neuroforaminal narrowing.                         There was severe right neural foraminal narrowing at C4-5.  There was moderate to severe left neural foraminal narrowing at C3-4.             Contact Dr. Manuella Ghazi regarding left upper extremity pain and weakness - done.                         The patient will be seen today in clinic             Patient has a consult with Dr. Izora Ribas of neurosurgery on 03/27/2020. 6.Hepatitis C Follow-up with Dr. Allen Norris. 7.   Hypertension and tachycardia             Blood pressure is 144/103 with a pulse of 131.             Concern for atrial fibrillation/flutter.               Patient has been compliant with his medications.               Dr Ubaldo Glassing on phone re:  HTN and tachycardia.             Patient will be seen in clinic tomorrow. 8.   RTC in 1-2 weeks for MD assessment, labs (CBC with diff, CMP, Mg), and Tecentriq.  I discussed the assessment and treatment plan with the patient.  The patient was provided an opportunity to ask questions and all were answered.  The patient agreed with the plan and demonstrated an understanding of the instructions.  The patient was advised to call back if the symptoms worsen or if the condition fails to improve as anticipated.  I provided *** minutes of face-to-face time during this  this encounter and > 50% was spent counseling as documented under my assessment and plan.   Lequita Asal, MD, PhD    04/14/2020 , 9:05 AM  I, Mirian Mo Tufford, am acting as Education administrator for [employer]. Mike Gip, MD, PhD.  I, Melissa C. Mike Gip, MD, have reviewed the above documentation for accuracy and completeness, and I agree with the above.

## 2020-04-15 ENCOUNTER — Inpatient Hospital Stay: Payer: Medicare Other

## 2020-04-15 ENCOUNTER — Inpatient Hospital Stay: Payer: Medicare Other | Attending: Hematology and Oncology | Admitting: Hematology and Oncology

## 2020-04-17 NOTE — Telephone Encounter (Signed)
Pt. called about his appt. he missed on 04/06/20 due to him being out of town. I resceduled appt. and added Lucianne Lei transportation for the day of his upcoming appt.

## 2020-04-17 NOTE — Telephone Encounter (Signed)
Could not leave a message about appt. for [place] service scheduled for 04/14/20@7 :45am pickup  By Aubery Lapping

## 2020-04-21 NOTE — Progress Notes (Signed)
[facility]  [address] [place], [place] [address] Phone: [phone]  Fax: [phone]   Clinic Day:  04/22/20  Referring physician: No ref. provider found  Chief Complaint: Scott Jordan is a 70 y.o. male with extensive stage small cell lung cancer who is seen for 3 week assessment.  HPI: The patient was last seen in the medical oncology clinic on 03/23/2020. At that time, he felt "alright".  He decribed a significant burning sensation in his left hand; pain began in his elbow and extended to his hand.  He could not pick up or hold anything in his left hand.  He experienced a shock-like sensation on extending his arm at the elbow.  Hematocrit was 40.9, hemoglobin 14.8, platelets 202,000, WBC 7,100. Sodium was 130. Calcium was 8.8. Magnesium was 1.6. TSH was 0.810. He was to see neurology. Tecentriq was held due to upper extremity symptoms.  The patient was seen in consultation by Dr. Manuella Ghazi on 03/23/2020.  He was noted to have left medial elbow pain with radiation to hand (burning, shocking) + subjective hand weakness and paresthesia.  Laboratory work-up included the following: Hgb A1C (5.3%), sed rate (2), SPEP (polyclonal increase), B12 (227; low), folate (17.1), B1 (136), B6 (25.9), and vitamin D, 25-hydroxy (23.9- low).   An EMG was scheduled.  He began a daily steroid dose taper (prednisone 60 mg, 50 mg, 40 mg, 30 mg, 20 mg, 10 mg, then stop).  He was started on gabapentin 300 mg at night x 1 week then increased to 300 mg BID.  He has a follow-up around 08/21/2020.   He was contacted by Dr Trena Platt office on 03/31/2020 to begin B12 1000 mcg a day and vitamin D3 1000 units a day.  He saw Dr. Ubaldo Glassing on 03/24/2020. His pulse was 106 in clinic. He denied chest pain or shortness of breath. He had mild dizziness when he stood up. He was felt to have typical atrial flutter with a rapid rate.  There was concern about noncompliance with medications.  The decision was  made to add diltiazem 30 mg TID. He continued Eliquis and Cardizem 240 mg.  He saw Dr. Izora Ribas on 03/27/2020. He reported pain and numbness radiating down his left arm into his hand. Pain was worse in his medial left forearm and into his 4th and 5th digit.  He had numbness in his 1st through 3rd digit as well.  He had trouble opening jars and reaching.  He was felt to have a possible left-sided C6 radiculopathy as well as ulnar neuropathy.  They discussed doing a nerve conduction study and a referral to physical therapy. If he has ulnar neuropathy, he will be referred to Dr. Lacinda Axon.  During the interim, he has felt "bad." His entire left arm still hurts and he cannot close his hand or pick things up. He keeps his left arm on a heating pad. He gets sharp pain in his right arm every once in a while. He spends the day on the couch because the pain is so bad. He cannot sleep at night. He is seeing Dr. Manuella Ghazi on 05/08/2020.  The patient gets dizzy when he stands up. He coughs up white phlegm. He gets short of breath with minimal exertion.  The patient is not eating well. He eats pizza, crackers, and sandwiches. He feels that Boost/Ensure gives him diarrhea. He is interested in getting approved for Marinol.   The patient has been taking vitamin B12 1,000 mcg  for the past 9 days. He is not doing drugs or drinking alcohol. He was previously on steroids but finished taking them. He thinks he is out of gabapentin.  He has trouble with transportation and has not been able to see physical therapy.   Past Medical History:  Diagnosis Date  . A-fib ([facility]) 01/10/2019   pt st this was dx by Dr. Ubaldo Glassing  . Cancer ([facility]) 2021   LUNG  . Complication of anesthesia    DIFFICULTY WAKING UP AFTER SURGERY- 20 YRS AGO  . Hypertension   . Lung cancer ([facility])   . Substance abuse [facility])     Past Surgical History:  Procedure Laterality Date  . ARM DEBRIDEMENT Left    INCISION AND DEBRIDEMENT LOWER ARM -20 YRS AGO  . BACK  SURGERY    . PORTACATH PLACEMENT Left 08/29/2019   Procedure: INSERTION PORT-A-CATH;  Surgeon: Nestor Lewandowsky, MD;  Location: [facility] ORS;  Service: General;  Laterality: Left;    History reviewed. No pertinent family history.  Social History:  reports that he has quit smoking. His smoking use included cigarettes. He smoked 0.50 packs per day. He has never used smokeless tobacco. He reports current alcohol use. He reports previous drug use. Drug: Heroin. The patient denies any exposure to radiation or toxins. The patient lives in [place]. He is dependent on public transportation or transportation from friends/family. He has family friends staying with him to help care for his needs.His niece, [name] [phone]), is his medical power of attorney. The patient is alone today.    Allergies: No Known Allergies  Current Medications: Current Outpatient Medications  Medication Sig Dispense Refill  . calcium carbonate (OS-CAL) 600 MG TABS tablet Take 600 mg by mouth daily with breakfast.    . diltiazem (CARDIZEM) 30 MG tablet Take by mouth.    Arne Cleveland 5 MG TABS tablet Take 5 mg by mouth 2 (two) times daily.    . Magnesium 500 MG CAPS Take 500 mg by mouth daily.    . potassium chloride SA (KLOR-CON) 20 MEQ tablet TAKE 1 TABLET BY MOUTH DAILY FOR 3 DAYS (Patient taking differently: Take 20 mEq by mouth daily.) 3 tablet 0  . sodium chloride 1 g tablet Take 1 tablet (1 g total) by mouth 3 (three) times daily with meals. 90 tablet 1  . dexamethasone (DECADRON) 4 MG tablet Take 1 tablet (4 mg total) by mouth daily. (Patient not taking: Reported on 04/22/2020) 25 tablet 0  . diltiazem (CARDIZEM CD) 240 MG 24 hr capsule Take 1 capsule (240 mg total) by mouth daily. 90 capsule 0  . escitalopram (LEXAPRO) 10 MG tablet Take 1 tablet (10 mg total) by mouth daily. (Patient not taking: Reported on 04/22/2020) 90 tablet 0  . feeding supplement, ENSURE ENLIVE, (ENSURE ENLIVE) LIQD Take 237 mLs by mouth 3  (three) times daily between meals. (Patient not taking: Reported on 04/22/2020) 237 mL 12  . furosemide (LASIX) 20 MG tablet Take 1 tablet (20 mg total) by mouth daily. (Patient not taking: Reported on 04/22/2020) 20 tablet 1  . gabapentin (NEURONTIN) 300 MG capsule Take by mouth. (Patient not taking: Reported on 04/22/2020)    . HYDROcodone-acetaminophen (NORCO/VICODIN) 5-325 MG tablet Take 1 tablet by mouth every 6 (six) hours as needed for moderate pain. (Patient not taking: No sig reported) 30 tablet 0  . lidocaine-prilocaine (EMLA) cream Apply to affected area once (Patient not taking: Reported on 04/22/2020) 30 g 3  . metoprolol succinate (TOPROL-XL)  50 MG 24 hr tablet Take 1 tablet (50 mg total) by mouth daily. Take with or immediately following a meal. 90 tablet 0  . nystatin (MYCOSTATIN) 100000 UNIT/ML suspension Use as directed 5 mLs (500,000 Units total) in the mouth or throat 4 (four) times daily. Swish and spit (Patient not taking: No sig reported) 60 mL 0  . ondansetron (ZOFRAN) 8 MG tablet Take 1 tablet (8 mg total) by mouth every 8 (eight) hours as needed for refractory nausea / vomiting. Start on day 3 after carboplatin chemo. (Patient not taking: No sig reported) 30 tablet 1  . predniSONE (DELTASONE) 10 MG tablet Take 60mg  day 1, 50mg  day 2, 40mg  day 3, 30mg  day 4, 20mg  day 5, 10mg  day 6, then stop (Patient not taking: Reported on 04/22/2020)     No current facility-administered medications for this visit.   Facility-Administered Medications Ordered in Other Visits  Medication Dose Route Frequency Provider Last Rate Last Admin  . heparin lock flush 100 unit/mL  500 Units Intravenous Once Hargis Vandyne C, MD      . sodium chloride flush (NS) 0.9 % injection 10 mL  10 mL Intravenous PRN Lequita Asal, MD   10 mL at 11/11/19 0086    Review of Systems  Constitutional: Positive for weight loss (7 lbs). Negative for chills, diaphoresis, fever and malaise/fatigue.       Feels  "bad."  HENT: Negative for congestion, ear discharge, ear pain, hearing loss, nosebleeds, sinus pain, sore throat and tinnitus.   Eyes: Negative.  Negative for blurred vision and double vision.  Respiratory: Positive for cough, sputum production (phlegm) and shortness of breath (with minimal exertion.). Negative for hemoptysis.   Cardiovascular: Negative.  Negative for chest pain, palpitations and leg swelling.  Gastrointestinal: Negative for abdominal pain, blood in stool, constipation, diarrhea, heartburn, melena, nausea and vomiting.       Not eating well.  Genitourinary: Negative.  Negative for dysuria, frequency, hematuria and urgency.  Musculoskeletal: Negative.  Negative for back pain, joint pain, myalgias and neck pain.  Skin: Negative for itching and rash.  Neurological: Positive for dizziness (when he stands up) and sensory change (pain in entire left arm; shooting pains in right arm). Negative for tingling, weakness and headaches.  Endo/Heme/Allergies: Negative.   Psychiatric/Behavioral: Negative for depression, memory loss and substance abuse (denies drug and alcohol use). The patient has insomnia (due to pain). The patient is not nervous/anxious.   All other systems reviewed and are negative.  Performance status (ECOG): 1-2  Vitals Blood pressure 107/67, pulse (!) 54, temperature (!) 96.6 F (35.9 C), temperature source Tympanic, resp. rate 18, weight 127 lb 13.9 oz (58 kg), SpO2 100 %.   Physical Exam Vitals and nursing note reviewed.  Constitutional:      General: He is not in acute distress.    Appearance: Normal appearance. He is not ill-appearing, toxic-appearing or diaphoretic.     Interventions: Face mask in place.  HENT:     Head: Normocephalic and atraumatic.     Comments: Albertina Parr.    Mouth/Throat:     Mouth: Mucous membranes are dry.     Pharynx: Oropharynx is clear. No oropharyngeal exudate or posterior oropharyngeal erythema.  Eyes:     General: No  scleral icterus.    Extraocular Movements: Extraocular movements intact.     Conjunctiva/sclera: Conjunctivae normal.     Pupils: Pupils are equal, round, and reactive to light.     Comments: Blue eyes.  Cardiovascular:     Rate and Rhythm: Normal rate and regular rhythm.     Heart sounds: No murmur heard. No gallop.   Pulmonary:     Effort: Pulmonary effort is normal. No respiratory distress.     Breath sounds: No wheezing or rales.  Chest:     Chest wall: No tenderness.  Breasts:     Right: No axillary adenopathy or supraclavicular adenopathy.     Left: No axillary adenopathy or supraclavicular adenopathy.    Abdominal:     General: Bowel sounds are normal. There is no distension.     Palpations: Abdomen is soft. There is no hepatomegaly, splenomegaly or mass.     Tenderness: There is no abdominal tenderness. There is no guarding or rebound.  Musculoskeletal:        General: No swelling or tenderness. Normal range of motion.     Cervical back: Normal range of motion.     Right lower leg: No edema.     Left lower leg: No edema.  Lymphadenopathy:     Head:     Right side of head: No preauricular, posterior auricular or occipital adenopathy.     Left side of head: No preauricular, posterior auricular or occipital adenopathy.     Cervical: No cervical adenopathy.     Upper Body:     Right upper body: No supraclavicular or axillary adenopathy.     Left upper body: No supraclavicular or axillary adenopathy.     Lower Body: No right inguinal adenopathy. No left inguinal adenopathy.  Skin:    General: Skin is warm and dry.     Findings: No bruising, erythema or rash.  Neurological:     Mental Status: He is alert and oriented to person, place, and time.     Sensory: No sensory deficit.     Comments: Left hand is weaker than right hand. Muscle wasting in left hand.  Psychiatric:        Mood and Affect: Mood normal.        Behavior: Behavior normal.        Thought Content:  Thought content normal.        Judgment: Judgment normal.     Imaging studies: 08/19/2019:  Chest CT without contrast revealed the left pleural pigtail drain. There was a small residual left hydropneumothorax. There was a 6.7 x 4 cm medial basilar left lower lobe lung masssuspicious for primary bronchogenic carcinoma. There was a lingular 1.0 cm solid pulmonary nodulesuspicious for ipsilateral metastasis. There was intralobular septal thickening and patchy groundglass opacity throughout the lingula and the left lower lobe with moderate residual atelectasis of the left lung base. A component of lymphangitic carcinomatosis was suspected. There was ipsilateral infra hilar, subcarinal, AP window, prevascular and upper retroperitoneal adenopathyc/wmetastatic disease. 08/21/2019:  Head MRI revealed no evidence of metastatic disease. 08/29/2019:  PET scan revealed markedly hypermetabolic left lower lobe pulmonary lesion (SUV 7.9) c/w the patient's known malignancy. There was associated hypermetabolic nodal metastases in the mediastinum and left hilum with metastatic involvement of left pleural space. Additional sites included upper abdominal hypermetabolic lymphadenopathy and scattered hypermetabolic bone metastases (left acetabulum, sacrum, and L5). There was interval decrease in left pleural effusion with areas of loculation. 10/11/2019:  Chest, abdomen, and pelvis CT revealed considerable reduction in size of prior hypermetabolic lymph nodes in the chest and abdomen, compatible with response to therapy. There was notably reduced size of the left pleural effusion although some complexity or loculation persists. The  previous areas of hypermetabolic nodularity along the pleural surface were not obviously nodular along the pleural surface. There was considerable reduction in size of the previously consolidated/collapsed left lower lobe, although there was still some residual indistinct opacity in this  region. The previous hypermetabolic osseous metastatic lesions were relatively occult.  There was mild airway thickening was present, suggesting bronchitis or reactive airways disease. There was trace pelvic ascites, improved from prior. There was chronic deformity of the left medial clavicle likely due to an old fracture with some deformity of the left sternoclavicular joint. 11/23/2019: Chest CT angiogram revealed no pulmonary embolus.  There was new patchy and ground-glass opacity in the dependent right lower lobe, perifissural right middle and right upper lobe. Findings were suspicious for pneumonia, including aspiration or atypical viral organisms such as COVID-19. There was a small left pleural effusion with slight increase size from prior exam. Volume loss in the left lower lobe with areas of ill-defined consolidation, was not significantly changed from prior. Right hilar adenopathy was new from prior exam, nonspecific in this setting. Recommend attention at follow-up. There was debris within the trachea and to a lesser extent right mainstem bronchus. There was right lower lobe bronchial thickening with areas of mucous plugging.  11/29/2019:  Abdomen and pelvis CT revealed residual patchy nodular foci of consolidation at the right lung base, overall decreased since 11/22/2019 favoring resolving bronchopneumonia.  Follow-up chest CT was advised.  There was a stable small dependent left pleural effusion and stable top normal size previously hypermetabolic left periaortic lymph node.  There was stable faint sclerotic left pelvic bone lesions.  There were no new or progressive metastatic disease in the abdomen or pelvis. 03/09/2020:  CXR showed left base atelectasis with small left pleural effusion, resent on prior CT examination. There was no new opacity evident.  There was a stable cardiac silhouette. There was no adenopathy evident. Port-A-Cath tip was at the cavoatrial junction. 03/10/2020:  Head MRI with  and without contrast revealed no evidence of intracranial metastatic disease. There were remote small infarcts in the left corona radiata and left thalamus, unchanged. There was stable moderate white matter disease, likely representing chronic small vessel ischemic disease. There were bilateral mastoid effusions. 03/10/2020:  Cervical spine MRI with and without contrast revealed no evidence of metastatic disease to the cervical spine. There were multilevel degenerative changes of the cervical spine, worst at C5-C6 where there was moderate spinal canal stenosis and severe bilateral neural foraminal narrowing. There was severe right neural foraminal narrowing at C4-5. There was moderate to severe left neural foraminal narrowing at C3-4. 03/19/2020:  PET scan revealed mild patchy left lower lobe opacity, stable versus mildly improved, without appreciable hypermetabolism on PET. While a small focus of residual tumor was difficult to entirely exclude, for the most part this favored treated disease.  There were no findings suspicious for metastatic disease.   Appointment on 04/22/2020  Component Date Value Ref Range Status  . WBC 04/22/2020 3.2* 4.0 - 10.5 K/uL Final  . RBC 04/22/2020 4.30  4.22 - 5.81 MIL/uL Final  . Hemoglobin 04/22/2020 14.3  13.0 - 17.0 g/dL Final  . HCT 04/22/2020 41.0  39.0 - 52.0 % Final  . MCV 04/22/2020 95.3  80.0 - 100.0 fL Final  . MCH 04/22/2020 33.3  26.0 - 34.0 pg Final  . MCHC 04/22/2020 34.9  30.0 - 36.0 g/dL Final  . RDW 04/22/2020 12.6  11.5 - 15.5 % Final  . Platelets 04/22/2020 170  150 -  400 K/uL Final  . nRBC 04/22/2020 0.0  0.0 - 0.2 % Final  . Neutrophils Relative % 04/22/2020 48  % Final  . Neutro Abs 04/22/2020 1.6* 1.7 - 7.7 K/uL Final  . Lymphocytes Relative 04/22/2020 40  % Final  . Lymphs Abs 04/22/2020 1.3  0.7 - 4.0 K/uL Final  . Monocytes Relative 04/22/2020 11  % Final  . Monocytes Absolute 04/22/2020 0.4  0.1 - 1.0 K/uL Final  . Eosinophils  Relative 04/22/2020 0  % Final  . Eosinophils Absolute 04/22/2020 0.0  0.0 - 0.5 K/uL Final  . Basophils Relative 04/22/2020 0  % Final  . Basophils Absolute 04/22/2020 0.0  0.0 - 0.1 K/uL Final  . Immature Granulocytes 04/22/2020 1  % Final  . Abs Immature Granulocytes 04/22/2020 0.04  0.00 - 0.07 K/uL Final   Performed at [facility], [address], [place], [place] [address]  . Sodium 04/22/2020 139  135 - 145 mmol/L Final  . Potassium 04/22/2020 3.7  3.5 - 5.1 mmol/L Final  . Chloride 04/22/2020 107  98 - 111 mmol/L Final  . CO2 04/22/2020 23  22 - 32 mmol/L Final  . Glucose, Bld 04/22/2020 144* 70 - 99 mg/dL Final   Glucose reference range applies only to samples taken after fasting for at least 8 hours.  . BUN 04/22/2020 15  8 - 23 mg/dL Final  . Creatinine, Ser 04/22/2020 0.96  0.61 - 1.24 mg/dL Final  . Calcium 04/22/2020 9.0  8.9 - 10.3 mg/dL Final  . Total Protein 04/22/2020 6.7  6.5 - 8.1 g/dL Final  . Albumin 04/22/2020 3.5  3.5 - 5.0 g/dL Final  . AST 04/22/2020 23  15 - 41 U/L Final  . ALT 04/22/2020 18  0 - 44 U/L Final  . Alkaline Phosphatase 04/22/2020 55  38 - 126 U/L Final  . Total Bilirubin 04/22/2020 0.6  0.3 - 1.2 mg/dL Final  . GFR, Estimated 04/22/2020 >60  >60 mL/min Final   Comment: (NOTE) Calculated using the CKD-EPI Creatinine Equation (2021)   . Anion gap 04/22/2020 9  5 - 15 Final   Performed at [facility], [address], [place], [place] [address]  . Magnesium 04/22/2020 1.6* 1.7 - 2.4 mg/dL Final   Performed at [facility], [address] [place], [place] [address]    Assessment:  Scott Jordan is a 70 y.o. male with extensive stage small cell lung cancer. He has a >40 pack year smoking. He presented with a recurrent left sided pleural effusion. He has undergone thoracentesis x 2 in the past month. He is s/p pigtail drain. Initial cytology x 2 was negative (lymphocytic exudative effusion).Pleural  fluid cytologyon 08/18/2019 confirmed small cell lung cancer.  Chest CT without contraston 08/19/2019 revealed the left pleural pigtail drain. There was a small residual left hydropneumothorax. There was a 6.7 x 4 cm medial basilar left lower lobe lung masssuspicious for primary bronchogenic carcinoma. There was a lingular 1.0 cm solid pulmonary nodulesuspicious for ipsilateral metastasis. There was intralobular septal thickening and patchy groundglass opacity throughout the lingula and the left lower lobe with moderate residual atelectasis of the left lung base. A component of lymphangitic carcinomatosis was suspected. There was ipsilateral infra hilar, subcarinal, AP window, prevascular and upper retroperitoneal adenopathyc/wmetastatic disease.  Head MRI on 08/21/2019 revealed no evidence of metastatic disease.  PET scan on 05/20/20201 revealed markedly hypermetabolic left lower lobe pulmonary lesion (SUV 7.9) c/w the patient's known  malignancy. There was associated hypermetabolic nodal metastases in the mediastinum and left hilum with metastatic involvement of left pleural space. Additional sites included upper abdominal hypermetabolic lymphadenopathy and scattered hypermetabolic bone metastases (left acetabulum, sacrum, and L5). There was interval decrease in left pleural effusion with areas of loculation.  CEA was 3.7 on 09/02/2019.  LDH was 206 on 09/23/2019.  He is s/p 4 cycles of carboplatin, etoposide, and Tecentriq (09/02/2019- 11/11/2019) with Margarette Canada support.  He is s/p 4 cycles of maintenance Tecentriq (12/02/2019 - 02/17/2020).  He received PCI from 01/09/2020 - 01/22/2020.   Head MRI on 03/10/2020 revealed no evidence of intracranial metastatic disease. There were remote small infarcts in the left corona radiata and left thalamus, unchanged. There was stable moderate white matter disease, likely representing chronic small vessel ischemic disease. There were bilateral  mastoid effusions.  Cervical spine MRI on 03/10/2020 revealed no evidence of metastatic disease to the cervical spine. There were multilevel degenerative changes of the cervical spine, worst at C5-C6 where there was moderate spinal canal stenosis and severe bilateral neural foraminal narrowing. There was severe right neural foraminal narrowing at C4-5. There was moderate to severe left neural foraminal narrowing at C3-4.  PET scan on 03/19/2020 revealed mild patchy left lower lobe opacity, stable versus mildly improved, without appreciable hypermetabolism on PET. While a small focus of residual tumor was difficult to entirely exclude, for the most part this favored treated disease.  There were no findings suspicious for metastatic disease.   He was admitted to [facility] from 11/22/2019 - 11/25/2019 with a heroin overdose and aspiration pneumonia.  He received 1 unit of PRBCs.  He was treated with Zosyn.  He was discharged on Augmentin x5 days.  He has hyponatremiasecondary to SIADH.  He began sodium chloride tablets (1 gm TID) on 09/04/2019.  He has B12 deficiency.  B12 was 227 on 03/23/2020.  Folate was 17.1 on 03/23/2020.  He has been on oral B12 since 04/14/2020.  He has left upper extremity symptoms c/w a left-sided C6 radiculopathy as well as ulnar neuropathy.  He has elevated LFTs. Hepatitis C antibody and hepatitis B core antibody total were positive on 12/23/2019.  Hepatitis B surface antigen, hepatitis B core IgM and hepatitis A IgM were negative.  Hepatitis B DNA was not detected. Hepatitis C RNA quantitative revealed 3.869 log 10 IU/mL (7400 IU/mL).  Hepatitis C genotype is 2b.  He has a significant family history of malignancy. A family member has had genetic testing (negative for Lynch syndrome).  Symptomatically, he feels "bad."  He has pain and weakness in his left arm. He has short of breath with minimal exertion.  He is not eating well.  Exam is stable.  WBC is 3200 with an ANC of  1600.  Plan: 1.   Labs today: CBC with diff, CMP, Mg, TSH. 2. Metastatic small cell lung cancer Clinically, he has shortness of breath on exertion. He is s/p 4 cycles of carboplatin, etoposide and Tecentriq (last 11/11/2019). Chest CT angiogram plus abdomen and pelvis CT in 08/2021revealed adramatic response to treatment. He completed PCI on 01/22/2020. He iss/p4cycles of maintenance Tecentriq (last 02/17/2020). Cycle #2 maintenance Tecentriq was held secondary to increased LFTs on 12/30/2019. No evidence for Tecentriq induced hepatitis. Etiology felt secondary to possible alcohol or less likely hepatitis C.  Tecentriq was held secondary to unexplained neuropathy in his left upper extremity. PET scan on 03/19/2020 revealed no clear evidence of recurrent disease.  Labs reviewed.  Cycle #5 maintenance Tecentriq. Discuss symptom management.  He has antiemetics at home to use on a prn bases.  Interventions are adequate.   .    3. History of atrial fibrillation/flutter He remains on Eliquis. Denies any excess bruising or bleeding. 4. Left upper extremity neurologic symptoms Left-sided symptoms appear related to possible nerve entrapment at the elbow. Right-sided symptoms appear at the wrist.             Head MRI on 03/10/2020 revealed no evidence of intracranial metastatic disease.              Cervical spine MRI with and without contrast on 03/10/2020 revealed no evidence of metastatic disease.              He is felt to have a a left-sided C6 radiculopathy as well as ulnar neuropathy.             He has been seen by Dr. Manuella Ghazi in neurology and Dr. Cari Caraway in neurosurgery. 5.B12 deficiency  B12 level was 227 on 03/23/2020.  He has been on oral B12 for 9  days.  Check B12 level in 1 month. 6.   Cycle #5 maintenanceTecentriq today. 7.   MD to contact Dr Izora Ribas and Dr Manuella Ghazi. 8.   RTC in 3 weeks for MD assessment, labs (CBC with diff, CMP, Mg, LDH, CEA), and Tecentriq.  I discussed the assessment and treatment plan with the patient.  The patient was provided an opportunity to ask questions and all were answered.  The patient agreed with the plan and demonstrated an understanding of the instructions.  The patient was advised to call back if the symptoms worsen or if the condition fails to improve as anticipated.   Lequita Asal, MD, PhD    04/22/2020 , 2:30 PM  I, Mirian Mo Tufford, am acting as Education administrator for [employer]. Mike Gip, MD, PhD.  I, Adrin Julian C. Mike Gip, MD, have reviewed the above documentation for accuracy and completeness, and I agree with the above.

## 2020-04-22 ENCOUNTER — Inpatient Hospital Stay (HOSPITAL_BASED_OUTPATIENT_CLINIC_OR_DEPARTMENT_OTHER): Payer: Medicare Other | Admitting: Hematology and Oncology

## 2020-04-22 ENCOUNTER — Inpatient Hospital Stay: Payer: Medicare Other | Attending: Hematology and Oncology

## 2020-04-22 ENCOUNTER — Encounter: Payer: Self-pay | Admitting: Hematology and Oncology

## 2020-04-22 ENCOUNTER — Other Ambulatory Visit: Payer: Self-pay

## 2020-04-22 ENCOUNTER — Inpatient Hospital Stay: Payer: Medicare Other

## 2020-04-22 VITALS — BP 107/67 | HR 54 | Temp 96.6°F | Resp 18 | Wt 127.9 lb

## 2020-04-22 DIAGNOSIS — Z7952 Long term (current) use of systemic steroids: Secondary | ICD-10-CM | POA: Diagnosis not present

## 2020-04-22 DIAGNOSIS — Z5112 Encounter for antineoplastic immunotherapy: Secondary | ICD-10-CM | POA: Diagnosis not present

## 2020-04-22 DIAGNOSIS — Z8673 Personal history of transient ischemic attack (TIA), and cerebral infarction without residual deficits: Secondary | ICD-10-CM | POA: Insufficient documentation

## 2020-04-22 DIAGNOSIS — C7951 Secondary malignant neoplasm of bone: Secondary | ICD-10-CM

## 2020-04-22 DIAGNOSIS — B182 Chronic viral hepatitis C: Secondary | ICD-10-CM

## 2020-04-22 DIAGNOSIS — I10 Essential (primary) hypertension: Secondary | ICD-10-CM | POA: Insufficient documentation

## 2020-04-22 DIAGNOSIS — Z87891 Personal history of nicotine dependence: Secondary | ICD-10-CM | POA: Insufficient documentation

## 2020-04-22 DIAGNOSIS — E538 Deficiency of other specified B group vitamins: Secondary | ICD-10-CM | POA: Diagnosis not present

## 2020-04-22 DIAGNOSIS — Z7901 Long term (current) use of anticoagulants: Secondary | ICD-10-CM

## 2020-04-22 DIAGNOSIS — D72819 Decreased white blood cell count, unspecified: Secondary | ICD-10-CM | POA: Diagnosis not present

## 2020-04-22 DIAGNOSIS — C3490 Malignant neoplasm of unspecified part of unspecified bronchus or lung: Secondary | ICD-10-CM

## 2020-04-22 DIAGNOSIS — C3432 Malignant neoplasm of lower lobe, left bronchus or lung: Secondary | ICD-10-CM | POA: Diagnosis not present

## 2020-04-22 DIAGNOSIS — I4891 Unspecified atrial fibrillation: Secondary | ICD-10-CM | POA: Diagnosis not present

## 2020-04-22 DIAGNOSIS — E871 Hypo-osmolality and hyponatremia: Secondary | ICD-10-CM

## 2020-04-22 DIAGNOSIS — C781 Secondary malignant neoplasm of mediastinum: Secondary | ICD-10-CM | POA: Diagnosis not present

## 2020-04-22 DIAGNOSIS — Z79899 Other long term (current) drug therapy: Secondary | ICD-10-CM | POA: Diagnosis not present

## 2020-04-22 LAB — CBC WITH DIFFERENTIAL/PLATELET
Abs Immature Granulocytes: 0.04 10*3/uL (ref 0.00–0.07)
Basophils Absolute: 0 10*3/uL (ref 0.0–0.1)
Basophils Relative: 0 %
Eosinophils Absolute: 0 10*3/uL (ref 0.0–0.5)
Eosinophils Relative: 0 %
HCT: 41 % (ref 39.0–52.0)
Hemoglobin: 14.3 g/dL (ref 13.0–17.0)
Immature Granulocytes: 1 %
Lymphocytes Relative: 40 %
Lymphs Abs: 1.3 10*3/uL (ref 0.7–4.0)
MCH: 33.3 pg (ref 26.0–34.0)
MCHC: 34.9 g/dL (ref 30.0–36.0)
MCV: 95.3 fL (ref 80.0–100.0)
Monocytes Absolute: 0.4 10*3/uL (ref 0.1–1.0)
Monocytes Relative: 11 %
Neutro Abs: 1.6 10*3/uL — ABNORMAL LOW (ref 1.7–7.7)
Neutrophils Relative %: 48 %
Platelets: 170 10*3/uL (ref 150–400)
RBC: 4.3 MIL/uL (ref 4.22–5.81)
RDW: 12.6 % (ref 11.5–15.5)
WBC: 3.2 10*3/uL — ABNORMAL LOW (ref 4.0–10.5)
nRBC: 0 % (ref 0.0–0.2)

## 2020-04-22 LAB — COMPREHENSIVE METABOLIC PANEL
ALT: 18 U/L (ref 0–44)
AST: 23 U/L (ref 15–41)
Albumin: 3.5 g/dL (ref 3.5–5.0)
Alkaline Phosphatase: 55 U/L (ref 38–126)
Anion gap: 9 (ref 5–15)
BUN: 15 mg/dL (ref 8–23)
CO2: 23 mmol/L (ref 22–32)
Calcium: 9 mg/dL (ref 8.9–10.3)
Chloride: 107 mmol/L (ref 98–111)
Creatinine, Ser: 0.96 mg/dL (ref 0.61–1.24)
GFR, Estimated: >60 mL/min (ref >60)
Glucose, Bld: 144 mg/dL — ABNORMAL HIGH (ref 70–99)
Potassium: 3.7 mmol/L (ref 3.5–5.1)
Sodium: 139 mmol/L (ref 135–145)
Total Bilirubin: 0.6 mg/dL (ref 0.3–1.2)
Total Protein: 6.7 g/dL (ref 6.5–8.1)

## 2020-04-22 LAB — FOLATE: Folate: 19.2 ng/mL (ref >5.9)

## 2020-04-22 LAB — MAGNESIUM: Magnesium: 1.6 mg/dL — ABNORMAL LOW (ref 1.7–2.4)

## 2020-04-22 LAB — TSH: TSH: 1.313 u[IU]/mL (ref 0.350–4.500)

## 2020-04-22 MED ORDER — HEPARIN SOD (PORK) LOCK FLUSH 100 UNIT/ML IV SOLN
500.0000 [IU] | Freq: Once | INTRAVENOUS | Status: AC | PRN
  Administered 2020-04-22: 500 [IU]
  Filled 2020-04-22: qty 5

## 2020-04-22 MED ORDER — SODIUM CHLORIDE 0.9 % IV SOLN
1200.0000 mg | Freq: Once | INTRAVENOUS | Status: AC
  Administered 2020-04-22: 1200 mg via INTRAVENOUS
  Filled 2020-04-22: qty 20

## 2020-04-22 MED ORDER — SODIUM CHLORIDE 0.9% FLUSH
10.0000 mL | INTRAVENOUS | Status: DC | PRN
  Filled 2020-04-22: qty 10

## 2020-04-22 MED ORDER — SODIUM CHLORIDE 0.9 % IV SOLN
Freq: Once | INTRAVENOUS | Status: AC
  Filled 2020-04-22: qty 250

## 2020-04-22 NOTE — Progress Notes (Signed)
Pt received prescribed treatment in clinic, pt stable at d/c. 

## 2020-04-22 NOTE — Progress Notes (Signed)
SOB and dizziness. Pain and stiffness in hands that radiate to the arm along with numbness and tingling. He is having a hard time getting his BP medication refilled. Coughing up white stuff. Overall just not feeling good at all.

## 2020-04-23 LAB — T4: T4, Total: 7.8 ug/dL (ref 4.5–12.0)

## 2020-05-12 NOTE — Progress Notes (Signed)
[facility]  [address] [place], [place] [address] Phone: [phone]  Fax: [phone]   Clinic Day:  05/13/20  Referring physician: No ref. provider found  Chief Complaint: Scott Jordan is a 70 y.o. male with extensive stage small cell lung cancer who is seen for assessment prior to cycle #6 maintenance Tecentriq.  HPI: The patient was last seen in the medical oncology clinic on 04/22/2020. At that time, he felt "bad".  He continued to have difficulties with his left arm.  Hematocrit was 41.0, hemoglobin 14.3, platelets 170,000, WBC 3,200.  Magnesium was 1.6. TSH was 1.313 and total T4 was 7.8. Folate was 19.2. He received cycle #5 Tecentriq.  During the interim, he has been fine. His visit with Dr. Manuella Jordan was postponed because of the weather. His symptoms have improved slightly. He used a heating pad and tries to keep moving his arms around.  He has a runny nose and is lightheaded when he stands up. He denies fevers and has not been out of the house. His cough and shortness of breath are stable.  He drinks 1 Ensure every other day; it gives him diarrhea if he drinks too much. He also drinks Pedialyte. The patient takes vitamin B12, potassium, and magnesium.  He is scheduled to see his cardiologist this week.   Past Medical History:  Diagnosis Date  . A-fib ([place]) 01/10/2019   pt st this was dx by Dr. Ubaldo Jordan  . Cancer ([place]) 2021   LUNG  . Complication of anesthesia    DIFFICULTY WAKING UP AFTER SURGERY- 20 YRS AGO  . Hypertension   . Lung cancer ([place])   . Substance abuse [facility])     Past Surgical History:  Procedure Laterality Date  . ARM DEBRIDEMENT Left    INCISION AND DEBRIDEMENT LOWER ARM -20 YRS AGO  . BACK SURGERY    . PORTACATH PLACEMENT Left 08/29/2019   Procedure: INSERTION PORT-A-CATH;  Surgeon: Scott Lewandowsky, MD;  Location: [facility] ORS;  Service: General;  Laterality: Left;    History reviewed. No pertinent family  history.  Social History:  reports that he has quit smoking. His smoking use included cigarettes. He smoked 0.50 packs per day. He has never used smokeless tobacco. He reports current alcohol use. He reports previous drug use. Drug: Heroin. The patient denies any exposure to radiation or toxins. The patient lives in [place]. He is dependent on public transportation or transportation from friends/family. He has family friends staying with him to help care for his needs.His niece, Scott Jordan [phone]), is his medical power of attorney. The patient is alone today.    Allergies: No Known Allergies  Current Medications: Current Outpatient Medications  Medication Sig Dispense Refill  . calcium carbonate (OS-CAL) 600 MG TABS tablet Take 600 mg by mouth daily with breakfast.    . cholecalciferol (VITAMIN D3) 25 MCG (1000 UNIT) tablet Take 1,000 Units by mouth daily.    . Cyanocobalamin (VITAMIN B 12) 500 MCG TABS Take by mouth.    . diltiazem (CARDIZEM CD) 240 MG 24 hr capsule Take 1 capsule (240 mg total) by mouth daily. 90 capsule 0  . diltiazem (CARDIZEM) 30 MG tablet Take by mouth.    Arne Cleveland 5 MG TABS tablet Take 5 mg by mouth 2 (two) times daily.    . Magnesium 500 MG CAPS Take 500 mg by mouth daily.    . potassium chloride SA (KLOR-CON) 20 MEQ tablet TAKE 1 TABLET BY MOUTH  DAILY FOR 3 DAYS (Patient taking differently: Take 20 mEq by mouth daily.) 3 tablet 0  . sodium chloride 1 g tablet Take 1 tablet (1 g total) by mouth 3 (three) times daily with meals. 90 tablet 1  . dexamethasone (DECADRON) 4 MG tablet Take 1 tablet (4 mg total) by mouth daily. (Patient not taking: No sig reported) 25 tablet 0  . escitalopram (LEXAPRO) 10 MG tablet Take 1 tablet (10 mg total) by mouth daily. (Patient not taking: No sig reported) 90 tablet 0  . feeding supplement, ENSURE ENLIVE, (ENSURE ENLIVE) LIQD Take 237 mLs by mouth 3 (three) times daily between meals. (Patient not taking: No sig reported) 237  mL 12  . furosemide (LASIX) 20 MG tablet Take 1 tablet (20 mg total) by mouth daily. (Patient not taking: No sig reported) 20 tablet 1  . gabapentin (NEURONTIN) 300 MG capsule Take by mouth. (Patient not taking: No sig reported)    . HYDROcodone-acetaminophen (NORCO/VICODIN) 5-325 MG tablet Take 1 tablet by mouth every 6 (six) hours as needed for moderate pain. (Patient not taking: No sig reported) 30 tablet 0  . lidocaine-prilocaine (EMLA) cream Apply to affected area once (Patient not taking: No sig reported) 30 g 3  . metoprolol succinate (TOPROL-XL) 50 MG 24 hr tablet Take 1 tablet (50 mg total) by mouth daily. Take with or immediately following a meal. (Patient not taking: Reported on 05/13/2020) 90 tablet 0  . nystatin (MYCOSTATIN) 100000 UNIT/ML suspension Use as directed 5 mLs (500,000 Units total) in the mouth or throat 4 (four) times daily. Swish and spit (Patient not taking: No sig reported) 60 mL 0  . ondansetron (ZOFRAN) 8 MG tablet Take 1 tablet (8 mg total) by mouth every 8 (eight) hours as needed for refractory nausea / vomiting. Start on day 3 after carboplatin chemo. (Patient not taking: No sig reported) 30 tablet 1  . predniSONE (DELTASONE) 10 MG tablet Take 60mg  day 1, 50mg  day 2, 40mg  day 3, 30mg  day 4, 20mg  day 5, 10mg  day 6, then stop (Patient not taking: No sig reported)     No current facility-administered medications for this visit.   Facility-Administered Medications Ordered in Other Visits  Medication Dose Route Frequency Provider Last Rate Last Admin  . heparin lock flush 100 unit/mL  500 Units Intravenous Once Scott Pousson C, MD      . sodium chloride flush (NS) 0.9 % injection 10 mL  10 mL Intravenous PRN Scott Asal, MD   10 mL at 11/11/19 4970    Review of Systems  Constitutional: Negative for chills, diaphoresis, fever, malaise/fatigue and weight loss (up 5 lbs).       Feels "alright".  HENT: Negative for congestion, ear discharge, ear pain, hearing  loss, nosebleeds, sinus pain, sore throat and tinnitus.        Runny nose.  Eyes: Negative.  Negative for blurred vision and double vision.  Respiratory: Positive for cough, sputum production (phlegm) and shortness of breath (on exertion). Negative for hemoptysis.   Cardiovascular: Negative.  Negative for chest pain, palpitations and leg swelling.  Gastrointestinal: Negative for abdominal pain, blood in stool, constipation, diarrhea, heartburn, melena, nausea and vomiting.       Not eating well.  Genitourinary: Negative.  Negative for dysuria, frequency, hematuria and urgency.  Musculoskeletal: Negative.  Negative for back pain, joint pain, myalgias and neck pain.  Skin: Negative for itching and rash.  Neurological: Positive for dizziness (when he stands up) and sensory  change (pain in entire left arm; shooting pains in right arm, improved). Negative for tingling, weakness and headaches.  Endo/Heme/Allergies: Negative.   Psychiatric/Behavioral: Negative for depression, memory loss and substance abuse (denies drug and alcohol use). The patient is not nervous/anxious and does not have insomnia.   All other systems reviewed and are negative.  Performance status (ECOG): 1  Vitals Blood pressure (!) 122/93, pulse 80, temperature (!) 95.7 F (35.4 Jordan), temperature source Tympanic, resp. rate 18, weight 132 lb 2.7 oz (59.9 kg), SpO2 100 %.   Physical Exam Vitals and nursing note reviewed.  Constitutional:      General: He is not in acute distress.    Appearance: Normal appearance. He is not ill-appearing, toxic-appearing or diaphoretic.     Interventions: Face mask in place.  HENT:     Head: Normocephalic and atraumatic.     Comments: Graying beard.    Mouth/Throat:     Mouth: Mucous membranes are moist.     Pharynx: Oropharynx is clear. No oropharyngeal exudate or posterior oropharyngeal erythema.  Eyes:     General: No scleral icterus.    Extraocular Movements: Extraocular movements  intact.     Conjunctiva/sclera: Conjunctivae normal.     Pupils: Pupils are equal, round, and reactive to light.     Comments: Blue eyes.  Cardiovascular:     Rate and Rhythm: Normal rate and regular rhythm.     Heart sounds: No murmur heard. No gallop.   Pulmonary:     Effort: Pulmonary effort is normal. No respiratory distress.     Breath sounds: No wheezing or rales.  Chest:     Chest wall: No tenderness.  Breasts:     Right: No axillary adenopathy or supraclavicular adenopathy.     Left: No axillary adenopathy or supraclavicular adenopathy.    Abdominal:     General: Bowel sounds are normal. There is no distension.     Palpations: Abdomen is soft. There is no hepatomegaly, splenomegaly or mass.     Tenderness: There is no abdominal tenderness. There is no guarding or rebound.  Musculoskeletal:        General: No swelling or tenderness. Normal range of motion.     Cervical back: Normal range of motion.     Right lower leg: No edema.     Left lower leg: No edema.  Lymphadenopathy:     Head:     Right side of head: No preauricular, posterior auricular or occipital adenopathy.     Left side of head: No preauricular, posterior auricular or occipital adenopathy.     Cervical: No cervical adenopathy.     Upper Body:     Right upper body: No supraclavicular or axillary adenopathy.     Left upper body: No supraclavicular or axillary adenopathy.     Lower Body: No right inguinal adenopathy. No left inguinal adenopathy.  Skin:    General: Skin is warm and dry.     Findings: No bruising, erythema or rash.  Neurological:     Mental Status: He is alert and oriented to person, place, and time.     Sensory: No sensory deficit.  Psychiatric:        Mood and Affect: Mood normal.        Behavior: Behavior normal.        Thought Content: Thought content normal.        Judgment: Judgment normal.     Imaging studies: 08/19/2019:  Chest CT without contrast revealed  the left pleural  pigtail drain. There was a small residual left hydropneumothorax. There was a 6.7 x 4 cm medial basilar left lower lobe lung masssuspicious for primary bronchogenic carcinoma. There was a lingular 1.0 cm solid pulmonary nodulesuspicious for ipsilateral metastasis. There was intralobular septal thickening and patchy groundglass opacity throughout the lingula and the left lower lobe with moderate residual atelectasis of the left lung base. A component of lymphangitic carcinomatosis was suspected. There was ipsilateral infra hilar, subcarinal, AP window, prevascular and upper retroperitoneal adenopathyc/wmetastatic disease. 08/21/2019:  Head MRI revealed no evidence of metastatic disease. 08/29/2019:  PET scan revealed markedly hypermetabolic left lower lobe pulmonary lesion (SUV 7.9) Jordan/w the patient's known malignancy. There was associated hypermetabolic nodal metastases in the mediastinum and left hilum with metastatic involvement of left pleural space. Additional sites included upper abdominal hypermetabolic lymphadenopathy and scattered hypermetabolic bone metastases (left acetabulum, sacrum, and L5). There was interval decrease in left pleural effusion with areas of loculation. 10/11/2019:  Chest, abdomen, and pelvis CT revealed considerable reduction in size of prior hypermetabolic lymph nodes in the chest and abdomen, compatible with response to therapy. There was notably reduced size of the left pleural effusion although some complexity or loculation persists. The previous areas of hypermetabolic nodularity along the pleural surface were not obviously nodular along the pleural surface. There was considerable reduction in size of the previously consolidated/collapsed left lower lobe, although there was still some residual indistinct opacity in this region. The previous hypermetabolic osseous metastatic lesions were relatively occult.  There was mild airway thickening was present, suggesting  bronchitis or reactive airways disease. There was trace pelvic ascites, improved from prior. There was chronic deformity of the left medial clavicle likely due to an old fracture with some deformity of the left sternoclavicular joint. 11/23/2019: Chest CT angiogram revealed no pulmonary embolus.  There was new patchy and ground-glass opacity in the dependent right lower lobe, perifissural right middle and right upper lobe. Findings were suspicious for pneumonia, including aspiration or atypical viral organisms such as COVID-19. There was a small left pleural effusion with slight increase size from prior exam. Volume loss in the left lower lobe with areas of ill-defined consolidation, was not significantly changed from prior. Right hilar adenopathy was new from prior exam, nonspecific in this setting. Recommend attention at follow-up. There was debris within the trachea and to a lesser extent right mainstem bronchus. There was right lower lobe bronchial thickening with areas of mucous plugging.  11/29/2019:  Abdomen and pelvis CT revealed residual patchy nodular foci of consolidation at the right lung base, overall decreased since 11/22/2019 favoring resolving bronchopneumonia.  Follow-up chest CT was advised.  There was a stable small dependent left pleural effusion and stable top normal size previously hypermetabolic left periaortic lymph node.  There was stable faint sclerotic left pelvic bone lesions.  There were no new or progressive metastatic disease in the abdomen or pelvis. 03/09/2020:  CXR showed left base atelectasis with small left pleural effusion, resent on prior CT examination. There was no new opacity evident.  There was a stable cardiac silhouette. There was no adenopathy evident. Port-A-Cath tip was at the cavoatrial junction. 03/10/2020:  Head MRI with and without contrast revealed no evidence of intracranial metastatic disease. There were remote small infarcts in the left corona radiata and  left thalamus, unchanged. There was stable moderate white matter disease, likely representing chronic small vessel ischemic disease. There were bilateral mastoid effusions. 03/10/2020:  Cervical spine MRI with and without  contrast revealed no evidence of metastatic disease to the cervical spine. There were multilevel degenerative changes of the cervical spine, worst at C5-C6 where there was moderate spinal canal stenosis and severe bilateral neural foraminal narrowing. There was severe right neural foraminal narrowing at C4-5. There was moderate to severe left neural foraminal narrowing at C3-4. 03/19/2020:  PET scan revealed mild patchy left lower lobe opacity, stable versus mildly improved, without appreciable hypermetabolism on PET. While a small focus of residual tumor was difficult to entirely exclude, for the most part this favored treated disease.  There were no findings suspicious for metastatic disease.   Infusion on 05/13/2020  Component Date Value Ref Range Status  . LDH 05/13/2020 102  98 - 192 U/L Final   Performed at [facility], [address], [place], [place] [address]  . Magnesium 05/13/2020 1.6* 1.7 - 2.4 mg/dL Final   Performed at [facility], [address] [place], [place] [address]  . Sodium 05/13/2020 137  135 - 145 mmol/L Final  . Potassium 05/13/2020 3.6  3.5 - 5.1 mmol/L Final  . Chloride 05/13/2020 105  98 - 111 mmol/L Final  . CO2 05/13/2020 19* 22 - 32 mmol/L Final  . Glucose, Bld 05/13/2020 141* 70 - 99 mg/dL Final   Glucose reference range applies only to samples taken after fasting for at least 8 hours.  . BUN 05/13/2020 15  8 - 23 mg/dL Final  . Creatinine, Ser 05/13/2020 0.86  0.61 - 1.24 mg/dL Final  . Calcium 05/13/2020 8.8* 8.9 - 10.3 mg/dL Final  . Total Protein 05/13/2020 6.6  6.5 - 8.1 g/dL Final  . Albumin 05/13/2020 3.4* 3.5 - 5.0 g/dL Final  . AST 05/13/2020 18  15 - 41 U/L Final  . ALT 05/13/2020 11  0 - 44 U/L Final  .  Alkaline Phosphatase 05/13/2020 49  38 - 126 U/L Final  . Total Bilirubin 05/13/2020 1.4* 0.3 - 1.2 mg/dL Final  . GFR, Estimated 05/13/2020 >60  >60 mL/min Final   Comment: (NOTE) Calculated using the CKD-EPI Creatinine Equation (2021)   . Anion gap 05/13/2020 13  5 - 15 Final   Performed at [facility], [address] [place], [place] [address]  . WBC 05/13/2020 6.4  4.0 - 10.5 K/uL Final  . RBC 05/13/2020 4.01* 4.22 - 5.81 MIL/uL Final  . Hemoglobin 05/13/2020 13.4  13.0 - 17.0 g/dL Final  . HCT 05/13/2020 38.7* 39.0 - 52.0 % Final  . MCV 05/13/2020 96.5  80.0 - 100.0 fL Final  . MCH 05/13/2020 33.4  26.0 - 34.0 pg Final  . MCHC 05/13/2020 34.6  30.0 - 36.0 g/dL Final  . RDW 05/13/2020 13.3  11.5 - 15.5 % Final  . Platelets 05/13/2020 170  150 - 400 K/uL Final  . nRBC 05/13/2020 0.0  0.0 - 0.2 % Final  . Neutrophils Relative % 05/13/2020 69  % Final  . Neutro Abs 05/13/2020 4.4  1.7 - 7.7 K/uL Final  . Lymphocytes Relative 05/13/2020 22  % Final  . Lymphs Abs 05/13/2020 1.4  0.7 - 4.0 K/uL Final  . Monocytes Relative 05/13/2020 8  % Final  . Monocytes Absolute 05/13/2020 0.5  0.1 - 1.0 K/uL Final  . Eosinophils Relative 05/13/2020 0  % Final  . Eosinophils Absolute 05/13/2020 0.0  0.0 - 0.5 K/uL Final  . Basophils Relative 05/13/2020 0  % Final  . Basophils Absolute 05/13/2020 0.0  0.0 - 0.1  K/uL Final  . Immature Granulocytes 05/13/2020 1  % Final  . Abs Immature Granulocytes 05/13/2020 0.07  0.00 - 0.07 K/uL Final   Performed at [facility], [address] [place], [place] [address]    Assessment:  Scott Jordan is a 70 y.o. male with extensive stage small cell lung cancer. He has a >40 pack year smoking. He presented with a recurrent left sided pleural effusion. He has undergone thoracentesis x 2 in the past month. He is s/p pigtail drain. Initial cytology x 2 was negative (lymphocytic exudative effusion).Pleural fluid cytologyon 08/18/2019  confirmed small cell lung cancer.  Chest CT without contraston 08/19/2019 revealed the left pleural pigtail drain. There was a small residual left hydropneumothorax. There was a 6.7 x 4 cm medial basilar left lower lobe lung masssuspicious for primary bronchogenic carcinoma. There was a lingular 1.0 cm solid pulmonary nodulesuspicious for ipsilateral metastasis. There was intralobular septal thickening and patchy groundglass opacity throughout the lingula and the left lower lobe with moderate residual atelectasis of the left lung base. A component of lymphangitic carcinomatosis was suspected. There was ipsilateral infra hilar, subcarinal, AP window, prevascular and upper retroperitoneal adenopathyc/wmetastatic disease.  Head MRI on 08/21/2019 revealed no evidence of metastatic disease.  PET scan on 05/20/20201 revealed markedly hypermetabolic left lower lobe pulmonary lesion (SUV 7.9) Jordan/w the patient's known malignancy. There was associated hypermetabolic nodal metastases in the mediastinum and left hilum with metastatic involvement of left pleural space. Additional sites included upper abdominal hypermetabolic lymphadenopathy and scattered hypermetabolic bone metastases (left acetabulum, sacrum, and L5). There was interval decrease in left pleural effusion with areas of loculation.  CEA was 3.7 on 09/02/2019, 3.8 on 02/17/2020 and 3.1 on 05/13/2020.  LDH was 206 on 09/23/2019, 144 on 10/16/2019 and 102 on 05/13/2020.  He is s/p 4 cycles of carboplatin, etoposide, and Tecentriq (09/02/2019- 11/11/2019) with Margarette Canada support.  He is s/p 5 cycles of maintenance Tecentriq (12/02/2019 - 02/17/2020; 04/22/2020).  He received PCI from 01/09/2020 - 01/22/2020.   Head MRI on 03/10/2020 revealed no evidence of intracranial metastatic disease. There were remote small infarcts in the left corona radiata and left thalamus, unchanged. There was stable moderate white matter disease, likely representing  chronic small vessel ischemic disease. There were bilateral mastoid effusions.  Cervical spine MRI on 03/10/2020 revealed no evidence of metastatic disease to the cervical spine. There were multilevel degenerative changes of the cervical spine, worst at C5-C6 where there was moderate spinal canal stenosis and severe bilateral neural foraminal narrowing. There was severe right neural foraminal narrowing at C4-5. There was moderate to severe left neural foraminal narrowing at C3-4.  PET scan on 03/19/2020 revealed mild patchy left lower lobe opacity, stable versus mildly improved, without appreciable hypermetabolism on PET. While a small focus of residual tumor was difficult to entirely exclude, for the most part this favored treated disease.  There were no findings suspicious for metastatic disease.   He was admitted to [facility] from 11/22/2019 - 11/25/2019 with a heroin overdose and aspiration pneumonia.  He received 1 unit of PRBCs.  He was treated with Zosyn.  He was discharged on Augmentin x5 days.  He has hyponatremiasecondary to SIADH.  He began sodium chloride tablets (1 gm TID) on 09/04/2019.  He has B12 deficiency.  B12 was 227 on 03/23/2020 and 646 on 05/13/2020.   He is on oral B12.  Folate was 19.2 on 04/22/2020.  He has left upper extremity symptoms Jordan/w a left-sided C6 radiculopathy  as well as ulnar neuropathy.  He has elevated LFTs. Hepatitis Jordan antibody and hepatitis B core antibody total were positive on 12/23/2019.  Hepatitis B surface antigen, hepatitis B core IgM and hepatitis A IgM were negative.  Hepatitis B DNA was not detected. Hepatitis Jordan RNA quantitative revealed 3.869 log 10 IU/mL (7400 IU/mL).  Hepatitis Jordan genotype is 2b.  He has a significant family history of malignancy. A family member has had genetic testing (negative for Lynch syndrome).  Symptomatically, he feels "alright".  He has gained 5 pounds.  He has a slight runny nose.  Exam is stable.  Plan: 1.   Labs today:  CBC with diff, CMP, Mg, B12, LDH, CEA. 2. Metastatic small cell lung cancer Clinically, he denies any respiratory symptoms. He is s/p 4 cycles of carboplatin, etoposide and Tecentriq (last 11/11/2019). Chest CT angiogram plus abdomen and pelvis CT in 08/2021revealed adramatic response to treatment. He completed PCI on 01/22/2020. He iss/p5cycles of maintenance Tecentriq (last 04/22/2020). Cycle #2 maintenance Tecentriq was held secondary to increased LFTs on 12/30/2019. No evidence for Tecentriq induced hepatitis. Etiology felt secondary to possible alcohol or less likely hepatitis Jordan.  Tecentriq has been held secondary to unexplained neuropathy in his left upper extremity. PET scan on 03/19/2020 revealed no clear evidence of recurrent disease.             Labs reviewed.  Cycle #6 maintenance Tecentriq today.    Discuss symptom management.  He has antiemetics at home to use on a prn bases.  Interventions are adequate.    3. History of atrial fibrillation/flutter He denies any bruising or bleeding.  Continue Eliquis. 4. Left C6 radiculopathy and ulnar neuropathy Left-sided symptoms appear related to possible nerve entrapment at the elbow.             Head MRI on 03/10/2020 revealed no evidence of intracranial metastatic disease.              Cervical spine MRI with and without contrast on 03/10/2020 confirmed no metastatic disease.                         There were multilevel degenerative changes, worst at C5-C6, with moderate spinal canal stenosis and severe bilateral neuroforaminal narrowing.                         There was severe right neural foraminal narrowing at C4-5.                          There was moderate to severe left neural foraminal narrowing at C3-4.             He is followed by Dr Scott Jordan and Dr  Izora Ribas.  Patient missed follow-up appointment with Dr Scott Jordan secondary to the weather.   Request follow-up visit. 6.B12 deficiency B12 was 227 on 03/23/2020 and 646 on today.  Continue oral B12.  Check folate periodically. 7.    Hypomagnesemia             Magnesium 1.6 today.  Magnesium 1 gm IV today. 8.   Cycle #6 maintenance Tecentriq today. 9.   RTC in 3 weeks for MD assessment, labs (CBC with diff, CMP, Mg, LDH, CEA), and cycle #7 maintenance Tecentriq.  I discussed the assessment and treatment plan with the patient.  The patient was provided an opportunity to ask questions and all were  answered.  The patient agreed with the plan and demonstrated an understanding of the instructions.  The patient was advised to call back if the symptoms worsen or if the condition fails to improve as anticipated.   Scott Asal, MD, PhD    05/13/2020 , 10:15 AM  I, Mirian Mo Tufford, am acting as Education administrator for [employer]. Mike Gip, MD, PhD.  I, Jamesyn Lindell Jordan. Mike Gip, MD, have reviewed the above documentation for accuracy and completeness, and I agree with the above.

## 2020-05-13 ENCOUNTER — Encounter: Payer: Self-pay | Admitting: Hematology and Oncology

## 2020-05-13 ENCOUNTER — Inpatient Hospital Stay: Payer: Medicare Other

## 2020-05-13 ENCOUNTER — Other Ambulatory Visit: Payer: Self-pay

## 2020-05-13 ENCOUNTER — Inpatient Hospital Stay (HOSPITAL_BASED_OUTPATIENT_CLINIC_OR_DEPARTMENT_OTHER): Payer: Medicare Other | Admitting: Hematology and Oncology

## 2020-05-13 ENCOUNTER — Other Ambulatory Visit: Payer: Self-pay | Admitting: Hematology and Oncology

## 2020-05-13 ENCOUNTER — Inpatient Hospital Stay: Payer: Medicare Other | Attending: Hematology and Oncology

## 2020-05-13 VITALS — BP 122/93 | HR 80 | Temp 95.7°F | Resp 18 | Wt 132.2 lb

## 2020-05-13 DIAGNOSIS — M5412 Radiculopathy, cervical region: Secondary | ICD-10-CM | POA: Diagnosis not present

## 2020-05-13 DIAGNOSIS — Z7901 Long term (current) use of anticoagulants: Secondary | ICD-10-CM

## 2020-05-13 DIAGNOSIS — Z7952 Long term (current) use of systemic steroids: Secondary | ICD-10-CM | POA: Insufficient documentation

## 2020-05-13 DIAGNOSIS — Z8701 Personal history of pneumonia (recurrent): Secondary | ICD-10-CM | POA: Diagnosis not present

## 2020-05-13 DIAGNOSIS — E538 Deficiency of other specified B group vitamins: Secondary | ICD-10-CM | POA: Diagnosis not present

## 2020-05-13 DIAGNOSIS — C781 Secondary malignant neoplasm of mediastinum: Secondary | ICD-10-CM | POA: Diagnosis not present

## 2020-05-13 DIAGNOSIS — I4891 Unspecified atrial fibrillation: Secondary | ICD-10-CM | POA: Insufficient documentation

## 2020-05-13 DIAGNOSIS — Z79899 Other long term (current) drug therapy: Secondary | ICD-10-CM | POA: Insufficient documentation

## 2020-05-13 DIAGNOSIS — Z8673 Personal history of transient ischemic attack (TIA), and cerebral infarction without residual deficits: Secondary | ICD-10-CM | POA: Diagnosis not present

## 2020-05-13 DIAGNOSIS — B182 Chronic viral hepatitis C: Secondary | ICD-10-CM

## 2020-05-13 DIAGNOSIS — I10 Essential (primary) hypertension: Secondary | ICD-10-CM | POA: Diagnosis not present

## 2020-05-13 DIAGNOSIS — G5622 Lesion of ulnar nerve, left upper limb: Secondary | ICD-10-CM | POA: Diagnosis not present

## 2020-05-13 DIAGNOSIS — C7951 Secondary malignant neoplasm of bone: Secondary | ICD-10-CM | POA: Insufficient documentation

## 2020-05-13 DIAGNOSIS — C3490 Malignant neoplasm of unspecified part of unspecified bronchus or lung: Secondary | ICD-10-CM | POA: Diagnosis not present

## 2020-05-13 DIAGNOSIS — Z5112 Encounter for antineoplastic immunotherapy: Secondary | ICD-10-CM | POA: Diagnosis not present

## 2020-05-13 DIAGNOSIS — G629 Polyneuropathy, unspecified: Secondary | ICD-10-CM | POA: Insufficient documentation

## 2020-05-13 DIAGNOSIS — D72819 Decreased white blood cell count, unspecified: Secondary | ICD-10-CM

## 2020-05-13 DIAGNOSIS — F17210 Nicotine dependence, cigarettes, uncomplicated: Secondary | ICD-10-CM | POA: Diagnosis not present

## 2020-05-13 DIAGNOSIS — C3412 Malignant neoplasm of upper lobe, left bronchus or lung: Secondary | ICD-10-CM | POA: Insufficient documentation

## 2020-05-13 DIAGNOSIS — E871 Hypo-osmolality and hyponatremia: Secondary | ICD-10-CM

## 2020-05-13 LAB — MAGNESIUM: Magnesium: 1.6 mg/dL — ABNORMAL LOW (ref 1.7–2.4)

## 2020-05-13 LAB — CBC WITH DIFFERENTIAL/PLATELET
Abs Immature Granulocytes: 0.07 10*3/uL (ref 0.00–0.07)
Basophils Absolute: 0 10*3/uL (ref 0.0–0.1)
Basophils Relative: 0 %
Eosinophils Absolute: 0 10*3/uL (ref 0.0–0.5)
Eosinophils Relative: 0 %
HCT: 38.7 % — ABNORMAL LOW (ref 39.0–52.0)
Hemoglobin: 13.4 g/dL (ref 13.0–17.0)
Immature Granulocytes: 1 %
Lymphocytes Relative: 22 %
Lymphs Abs: 1.4 10*3/uL (ref 0.7–4.0)
MCH: 33.4 pg (ref 26.0–34.0)
MCHC: 34.6 g/dL (ref 30.0–36.0)
MCV: 96.5 fL (ref 80.0–100.0)
Monocytes Absolute: 0.5 10*3/uL (ref 0.1–1.0)
Monocytes Relative: 8 %
Neutro Abs: 4.4 10*3/uL (ref 1.7–7.7)
Neutrophils Relative %: 69 %
Platelets: 170 10*3/uL (ref 150–400)
RBC: 4.01 MIL/uL — ABNORMAL LOW (ref 4.22–5.81)
RDW: 13.3 % (ref 11.5–15.5)
WBC: 6.4 10*3/uL (ref 4.0–10.5)
nRBC: 0 % (ref 0.0–0.2)

## 2020-05-13 LAB — COMPREHENSIVE METABOLIC PANEL
ALT: 11 U/L (ref 0–44)
AST: 18 U/L (ref 15–41)
Albumin: 3.4 g/dL — ABNORMAL LOW (ref 3.5–5.0)
Alkaline Phosphatase: 49 U/L (ref 38–126)
Anion gap: 13 (ref 5–15)
BUN: 15 mg/dL (ref 8–23)
CO2: 19 mmol/L — ABNORMAL LOW (ref 22–32)
Calcium: 8.8 mg/dL — ABNORMAL LOW (ref 8.9–10.3)
Chloride: 105 mmol/L (ref 98–111)
Creatinine, Ser: 0.86 mg/dL (ref 0.61–1.24)
GFR, Estimated: >60 mL/min (ref >60)
Glucose, Bld: 141 mg/dL — ABNORMAL HIGH (ref 70–99)
Potassium: 3.6 mmol/L (ref 3.5–5.1)
Sodium: 137 mmol/L (ref 135–145)
Total Bilirubin: 1.4 mg/dL — ABNORMAL HIGH (ref 0.3–1.2)
Total Protein: 6.6 g/dL (ref 6.5–8.1)

## 2020-05-13 LAB — VITAMIN B12: Vitamin B-12: 646 pg/mL (ref 180–914)

## 2020-05-13 LAB — LACTATE DEHYDROGENASE: LDH: 102 U/L (ref 98–192)

## 2020-05-13 MED ORDER — SODIUM CHLORIDE 0.9 % IV SOLN
1200.0000 mg | Freq: Once | INTRAVENOUS | Status: AC
  Administered 2020-05-13: 1200 mg via INTRAVENOUS
  Filled 2020-05-13: qty 20

## 2020-05-13 MED ORDER — HEPARIN SOD (PORK) LOCK FLUSH 100 UNIT/ML IV SOLN
500.0000 [IU] | Freq: Once | INTRAVENOUS | Status: AC | PRN
  Administered 2020-05-13: 500 [IU]
  Filled 2020-05-13: qty 5

## 2020-05-13 MED ORDER — MAGNESIUM SULFATE 50 % IJ SOLN
1.0000 g | Freq: Once | INTRAVENOUS | Status: DC
  Filled 2020-05-13: qty 2

## 2020-05-13 MED ORDER — SODIUM CHLORIDE 0.9 % IV SOLN
Freq: Once | INTRAVENOUS | Status: AC
  Filled 2020-05-13: qty 250

## 2020-05-13 MED ORDER — SODIUM CHLORIDE 0.9 % IV SOLN
1.0000 g | Freq: Once | INTRAVENOUS | Status: AC
  Administered 2020-05-13: 1 g via INTRAVENOUS
  Filled 2020-05-13: qty 2

## 2020-05-13 NOTE — Progress Notes (Unsigned)
Patient here for oncology follow-up appointment, expresses concerns of lightheaded with head cold.

## 2020-05-13 NOTE — Progress Notes (Signed)
Pt received prescribed treatment in clinic, pt stable at d/c. Pt received one gram mag in addition to tx

## 2020-05-14 LAB — CEA: CEA: 3.1 ng/mL (ref 0.0–4.7)

## 2020-05-15 DIAGNOSIS — I483 Typical atrial flutter: Secondary | ICD-10-CM | POA: Diagnosis not present

## 2020-05-15 DIAGNOSIS — J9601 Acute respiratory failure with hypoxia: Secondary | ICD-10-CM | POA: Diagnosis not present

## 2020-05-15 DIAGNOSIS — Z7901 Long term (current) use of anticoagulants: Secondary | ICD-10-CM | POA: Diagnosis not present

## 2020-05-15 DIAGNOSIS — I10 Essential (primary) hypertension: Secondary | ICD-10-CM | POA: Diagnosis not present

## 2020-05-15 DIAGNOSIS — C3490 Malignant neoplasm of unspecified part of unspecified bronchus or lung: Secondary | ICD-10-CM | POA: Diagnosis not present

## 2020-06-02 NOTE — Progress Notes (Signed)
[facility]  [address], [address] [place], [place] [address] Phone: [phone]  Fax: [phone]   Clinic Day:  06/03/20  Referring physician: No ref. provider found  Chief Complaint: Scott Jordan is a 69 y.o. male with extensive stage small cell lung cancer who is seen for assessment prior to cycle #7 maintenance Tecentriq.  HPI: The patient was last seen in the medical oncology clinic on 05/13/2020. At that time, he felt "alright". He had gained 5 pounds. Exam was stable. Hematocrit was 38.7, hemoglobin 13.4, platelets 170,000, WBC 6,400. Calcium was 8.8 and albumin 3.4. Bilirubin was 1.4. Vitamin B12 was 646. CEA was 3.1 and LDH 102. Magnesium was 1.6. He received 1 gram IV magnesium.  He received cycle #6 maintenance Tecentriq.   During the interim, he has been "good." He states that "everything is the same." He is scheduled to see Dr. Manuella Ghazi on 06/26/2020. He is eating well and is not sure why he is down 3 lbs. He drinks Ensure but not everyday. He denies nausea, vomiting, and diarrhea.  He still has phlegm and shortness of breath. His dizziness while standing has improved. The pain in his arms is stable.  He takes one magnesium pill daily. He denies alcohol use.   Past Medical History:  Diagnosis Date  . A-fib ([place]) 01/10/2019   pt st this was dx by Dr. Ubaldo Glassing  . Cancer ([place]) 2021   LUNG  . Complication of anesthesia    DIFFICULTY WAKING UP AFTER SURGERY- 20 YRS AGO  . Hypertension   . Lung cancer ([place])   . Substance abuse [facility])     Past Surgical History:  Procedure Laterality Date  . ARM DEBRIDEMENT Left    INCISION AND DEBRIDEMENT LOWER ARM -20 YRS AGO  . BACK SURGERY    . PORTACATH PLACEMENT Left 08/29/2019   Procedure: INSERTION PORT-A-CATH;  Surgeon: Nestor Lewandowsky, MD;  Location: [facility] ORS;  Service: General;  Laterality: Left;    History reviewed. No pertinent family history.  Social History:  reports that he has quit smoking. His  smoking use included cigarettes. He smoked 0.50 packs per day. He has never used smokeless tobacco. He reports current alcohol use. He reports previous drug use. Drug: Heroin. The patient denies any exposure to radiation or toxins. The patient lives in [place]. He is dependent on public transportation or transportation from friends/family. He has family friends staying with him to help care for his needs.His niece, Scott Jordan [phone]), is his medical power of attorney. The patient is alone today.   Allergies: No Known Allergies  Current Medications: Current Outpatient Medications  Medication Sig Dispense Refill  . calcium carbonate (OS-CAL) 600 MG TABS tablet Take 600 mg by mouth daily with breakfast.    . cholecalciferol (VITAMIN D3) 25 MCG (1000 UNIT) tablet Take 1,000 Units by mouth daily.    . Cyanocobalamin (VITAMIN B 12) 500 MCG TABS Take by mouth.    . diltiazem (TIAZAC) 240 MG 24 hr capsule Take 1 capsule by mouth daily.    Marland Kitchen ELIQUIS 5 MG TABS tablet Take 5 mg by mouth 2 (two) times daily.    . feeding supplement, ENSURE ENLIVE, (ENSURE ENLIVE) LIQD Take 237 mLs by mouth 3 (three) times daily between meals. 237 mL 12  . Magnesium 500 MG CAPS Take 500 mg by mouth daily.    . potassium chloride SA (KLOR-CON) 20 MEQ tablet TAKE 1 TABLET BY MOUTH DAILY FOR 3 DAYS (Patient taking differently:  Take 20 mEq by mouth daily.) 3 tablet 0  . sodium chloride 1 g tablet Take 1 tablet (1 g total) by mouth 3 (three) times daily with meals. 90 tablet 1  . dexamethasone (DECADRON) 4 MG tablet Take 1 tablet (4 mg total) by mouth daily. (Patient not taking: No sig reported) 25 tablet 0  . escitalopram (LEXAPRO) 10 MG tablet Take 1 tablet (10 mg total) by mouth daily. (Patient not taking: No sig reported) 90 tablet 0  . lidocaine-prilocaine (EMLA) cream Apply to affected area once (Patient not taking: No sig reported) 30 g 3  . naloxone (NARCAN) nasal spray 4 mg/0.1 mL once as needed (Patient not  taking: Reported on 06/03/2020)    . nystatin (MYCOSTATIN) 100000 UNIT/ML suspension Use as directed 5 mLs (500,000 Units total) in the mouth or throat 4 (four) times daily. Swish and spit (Patient not taking: No sig reported) 60 mL 0  . ondansetron (ZOFRAN) 8 MG tablet Take 1 tablet (8 mg total) by mouth every 8 (eight) hours as needed for refractory nausea / vomiting. Start on day 3 after carboplatin chemo. (Patient not taking: No sig reported) 30 tablet 1   No current facility-administered medications for this visit.   Facility-Administered Medications Ordered in Other Visits  Medication Dose Route Frequency Provider Last Rate Last Admin  . heparin lock flush 100 unit/mL  500 Units Intravenous Once Mylik Pro C, MD      . sodium chloride flush (NS) 0.9 % injection 10 mL  10 mL Intravenous PRN Lequita Asal, MD   10 mL at 11/11/19 1610    Review of Systems  Constitutional: Positive for weight loss (3 lbs). Negative for chills, diaphoresis, fever and malaise/fatigue.       Feels "good." "Everything is the same."  HENT: Negative for congestion, ear discharge, ear pain, hearing loss, nosebleeds, sinus pain, sore throat and tinnitus.   Eyes: Negative.  Negative for blurred vision and double vision.  Respiratory: Positive for cough, sputum production (phlegm) and shortness of breath (on exertion). Negative for hemoptysis.   Cardiovascular: Negative.  Negative for chest pain, palpitations and leg swelling.  Gastrointestinal: Negative.  Negative for abdominal pain, blood in stool, constipation, diarrhea, heartburn, melena, nausea and vomiting.       Eating well.  Genitourinary: Negative.  Negative for dysuria, frequency, hematuria and urgency.  Musculoskeletal: Negative.  Negative for back pain, joint pain, myalgias and neck pain.  Skin: Negative for itching and rash.  Neurological: Positive for dizziness (when he stands up, improved) and sensory change (pain in entire left arm;  shooting pains in right arm, improved). Negative for tingling, weakness and headaches.  Endo/Heme/Allergies: Negative.   Psychiatric/Behavioral: Negative for depression, memory loss and substance abuse (denies drug and alcohol use). The patient is not nervous/anxious and does not have insomnia.   All other systems reviewed and are negative.  Performance status (ECOG): 1  Vitals Blood pressure (!) 135/98, pulse 75, temperature (!) 96.3 F (35.7 C), temperature source Tympanic, resp. rate 18, weight 129 lb (58.5 kg), SpO2 100 %.   Physical Exam Vitals and nursing note reviewed.  Constitutional:      General: He is not in acute distress.    Appearance: Normal appearance. He is not ill-appearing, toxic-appearing or diaphoretic.     Interventions: Face mask in place.  HENT:     Head: Normocephalic and atraumatic.     Comments: Graying beard.    Mouth/Throat:     Mouth: Mucous  membranes are moist.     Pharynx: Oropharynx is clear. No oropharyngeal exudate or posterior oropharyngeal erythema.  Eyes:     General: No scleral icterus.    Extraocular Movements: Extraocular movements intact.     Conjunctiva/sclera: Conjunctivae normal.     Pupils: Pupils are equal, round, and reactive to light.     Comments: Blue eyes.  Cardiovascular:     Rate and Rhythm: Normal rate and regular rhythm.     Heart sounds: No murmur heard. No gallop.   Pulmonary:     Effort: Pulmonary effort is normal. No respiratory distress.     Breath sounds: No wheezing or rales.  Chest:     Chest wall: No tenderness.  Breasts:     Right: No axillary adenopathy or supraclavicular adenopathy.     Left: No axillary adenopathy or supraclavicular adenopathy.    Abdominal:     General: Bowel sounds are normal. There is no distension.     Palpations: Abdomen is soft. There is no hepatomegaly, splenomegaly or mass.     Tenderness: There is no abdominal tenderness. There is no guarding or rebound.  Musculoskeletal:         General: No swelling or tenderness. Normal range of motion.     Cervical back: Normal range of motion.     Right lower leg: No edema.     Left lower leg: No edema.  Lymphadenopathy:     Head:     Right side of head: No preauricular, posterior auricular or occipital adenopathy.     Left side of head: No preauricular, posterior auricular or occipital adenopathy.     Cervical: No cervical adenopathy.     Upper Body:     Right upper body: No supraclavicular or axillary adenopathy.     Left upper body: No supraclavicular or axillary adenopathy.     Lower Body: No right inguinal adenopathy. No left inguinal adenopathy.  Skin:    General: Skin is warm and dry.     Findings: No bruising, erythema or rash.     Comments: Port-a-cath unremarkable.  Neurological:     Mental Status: He is alert and oriented to person, place, and time.     Sensory: No sensory deficit.  Psychiatric:        Mood and Affect: Mood normal.        Behavior: Behavior normal.        Thought Content: Thought content normal.        Judgment: Judgment normal.     Imaging studies: 08/19/2019:  Chest CT without contrast revealed the left pleural pigtail drain. There was a small residual left hydropneumothorax. There was a 6.7 x 4 cm medial basilar left lower lobe lung masssuspicious for primary bronchogenic carcinoma. There was a lingular 1.0 cm solid pulmonary nodulesuspicious for ipsilateral metastasis. There was intralobular septal thickening and patchy groundglass opacity throughout the lingula and the left lower lobe with moderate residual atelectasis of the left lung base. A component of lymphangitic carcinomatosis was suspected. There was ipsilateral infra hilar, subcarinal, AP window, prevascular and upper retroperitoneal adenopathyc/wmetastatic disease. 08/21/2019:  Head MRI revealed no evidence of metastatic disease. 08/29/2019:  PET scan revealed markedly hypermetabolic left lower lobe pulmonary lesion  (SUV 7.9) c/w the patient's known malignancy. There was associated hypermetabolic nodal metastases in the mediastinum and left hilum with metastatic involvement of left pleural space. Additional sites included upper abdominal hypermetabolic lymphadenopathy and scattered hypermetabolic bone metastases (left acetabulum, sacrum, and L5). There  was interval decrease in left pleural effusion with areas of loculation. 10/11/2019:  Chest, abdomen, and pelvis CT revealed considerable reduction in size of prior hypermetabolic lymph nodes in the chest and abdomen, compatible with response to therapy. There was notably reduced size of the left pleural effusion although some complexity or loculation persists. The previous areas of hypermetabolic nodularity along the pleural surface were not obviously nodular along the pleural surface. There was considerable reduction in size of the previously consolidated/collapsed left lower lobe, although there was still some residual indistinct opacity in this region. The previous hypermetabolic osseous metastatic lesions were relatively occult.  There was mild airway thickening was present, suggesting bronchitis or reactive airways disease. There was trace pelvic ascites, improved from prior. There was chronic deformity of the left medial clavicle likely due to an old fracture with some deformity of the left sternoclavicular joint. 11/23/2019: Chest CT angiogram revealed no pulmonary embolus.  There was new patchy and ground-glass opacity in the dependent right lower lobe, perifissural right middle and right upper lobe. Findings were suspicious for pneumonia, including aspiration or atypical viral organisms such as COVID-19. There was a small left pleural effusion with slight increase size from prior exam. Volume loss in the left lower lobe with areas of ill-defined consolidation, was not significantly changed from prior. Right hilar adenopathy was new from prior exam, nonspecific in this  setting. Recommend attention at follow-up. There was debris within the trachea and to a lesser extent right mainstem bronchus. There was right lower lobe bronchial thickening with areas of mucous plugging.  11/29/2019:  Abdomen and pelvis CT revealed residual patchy nodular foci of consolidation at the right lung base, overall decreased since 11/22/2019 favoring resolving bronchopneumonia.  Follow-up chest CT was advised.  There was a stable small dependent left pleural effusion and stable top normal size previously hypermetabolic left periaortic lymph node.  There was stable faint sclerotic left pelvic bone lesions.  There were no new or progressive metastatic disease in the abdomen or pelvis. 03/09/2020:  CXR showed left base atelectasis with small left pleural effusion, resent on prior CT examination. There was no new opacity evident.  There was a stable cardiac silhouette. There was no adenopathy evident. Port-A-Cath tip was at the cavoatrial junction. 03/10/2020:  Head MRI with and without contrast revealed no evidence of intracranial metastatic disease. There were remote small infarcts in the left corona radiata and left thalamus, unchanged. There was stable moderate white matter disease, likely representing chronic small vessel ischemic disease. There were bilateral mastoid effusions. 03/10/2020:  Cervical spine MRI with and without contrast revealed no evidence of metastatic disease to the cervical spine. There were multilevel degenerative changes of the cervical spine, worst at C5-C6 where there was moderate spinal canal stenosis and severe bilateral neural foraminal narrowing. There was severe right neural foraminal narrowing at C4-5. There was moderate to severe left neural foraminal narrowing at C3-4. 03/19/2020:  PET scan revealed mild patchy left lower lobe opacity, stable versus mildly improved, without appreciable hypermetabolism on PET. While a small focus of residual tumor was difficult to  entirely exclude, for the most part this favored treated disease.  There were no findings suspicious for metastatic disease.   Infusion on 06/03/2020  Component Date Value Ref Range Status  . WBC 06/03/2020 3.6* 4.0 - 10.5 K/uL Final  . RBC 06/03/2020 3.79* 4.22 - 5.81 MIL/uL Final  . Hemoglobin 06/03/2020 13.1  13.0 - 17.0 g/dL Final  . HCT 06/03/2020 36.8* 39.0 - 52.0 %  Final  . MCV 06/03/2020 97.1  80.0 - 100.0 fL Final  . MCH 06/03/2020 34.6* 26.0 - 34.0 pg Final  . MCHC 06/03/2020 35.6  30.0 - 36.0 g/dL Final  . RDW 06/03/2020 14.0  11.5 - 15.5 % Final  . Platelets 06/03/2020 199  150 - 400 K/uL Final  . nRBC 06/03/2020 0.0  0.0 - 0.2 % Final  . Neutrophils Relative % 06/03/2020 56  % Final  . Neutro Abs 06/03/2020 2.0  1.7 - 7.7 K/uL Final  . Lymphocytes Relative 06/03/2020 32  % Final  . Lymphs Abs 06/03/2020 1.1  0.7 - 4.0 K/uL Final  . Monocytes Relative 06/03/2020 11  % Final  . Monocytes Absolute 06/03/2020 0.4  0.1 - 1.0 K/uL Final  . Eosinophils Relative 06/03/2020 0  % Final  . Eosinophils Absolute 06/03/2020 0.0  0.0 - 0.5 K/uL Final  . Basophils Relative 06/03/2020 0  % Final  . Basophils Absolute 06/03/2020 0.0  0.0 - 0.1 K/uL Final  . Immature Granulocytes 06/03/2020 1  % Final  . Abs Immature Granulocytes 06/03/2020 0.05  0.00 - 0.07 K/uL Final   Performed at [facility], [address], [place], [place] [address]  . Sodium 06/03/2020 135  135 - 145 mmol/L Final  . Potassium 06/03/2020 3.5  3.5 - 5.1 mmol/L Final  . Chloride 06/03/2020 104  98 - 111 mmol/L Final  . CO2 06/03/2020 23  22 - 32 mmol/L Final  . Glucose, Bld 06/03/2020 103* 70 - 99 mg/dL Final   Glucose reference range applies only to samples taken after fasting for at least 8 hours.  . BUN 06/03/2020 8  8 - 23 mg/dL Final  . Creatinine, Ser 06/03/2020 0.74  0.61 - 1.24 mg/dL Final  . Calcium 06/03/2020 9.1  8.9 - 10.3 mg/dL Final  . Total Protein 06/03/2020 6.7  6.5 - 8.1 g/dL Final   . Albumin 06/03/2020 3.5  3.5 - 5.0 g/dL Final  . AST 06/03/2020 15  15 - 41 U/L Final  . ALT 06/03/2020 9  0 - 44 U/L Final  . Alkaline Phosphatase 06/03/2020 57  38 - 126 U/L Final  . Total Bilirubin 06/03/2020 1.2  0.3 - 1.2 mg/dL Final  . GFR, Estimated 06/03/2020 >60  >60 mL/min Final   Comment: (NOTE) Calculated using the CKD-EPI Creatinine Equation (2021)   . Anion gap 06/03/2020 8  5 - 15 Final   Performed at [facility], [address] [place], [place] [address]  . Magnesium 06/03/2020 1.6* 1.7 - 2.4 mg/dL Final   Performed at [facility], [address], [place], [place] [address]  . LDH 06/03/2020 100  98 - 192 U/L Final   Performed at [facility], [address] [place], [place] [address]    Assessment:  AENGUS SAUCEDA is a 70 y.o. male with extensive stage small cell lung cancer. He has a >40 pack year smoking. He presented with a recurrent left sided pleural effusion. He has undergone thoracentesis x 2 in the past month. He is s/p pigtail drain. Initial cytology x 2 was negative (lymphocytic exudative effusion).Pleural fluid cytologyon 08/18/2019 confirmed small cell lung cancer.  Chest CT without contraston 08/19/2019 revealed the left pleural pigtail drain. There was a small residual left hydropneumothorax. There was a 6.7 x 4 cm medial basilar left lower lobe lung masssuspicious for primary bronchogenic carcinoma. There was a lingular 1.0 cm solid pulmonary nodulesuspicious for ipsilateral  metastasis. There was intralobular septal thickening and patchy groundglass opacity throughout the lingula and the left lower lobe with moderate residual atelectasis of the left lung base. A component of lymphangitic carcinomatosis was suspected. There was ipsilateral infra hilar, subcarinal, AP window, prevascular and upper retroperitoneal adenopathyc/wmetastatic disease.  Head MRI on 08/21/2019 revealed no evidence of  metastatic disease.  PET scan on 05/20/20201 revealed markedly hypermetabolic left lower lobe pulmonary lesion (SUV 7.9) c/w the patient's known malignancy. There was associated hypermetabolic nodal metastases in the mediastinum and left hilum with metastatic involvement of left pleural space. Additional sites included upper abdominal hypermetabolic lymphadenopathy and scattered hypermetabolic bone metastases (left acetabulum, sacrum, and L5). There was interval decrease in left pleural effusion with areas of loculation.  CEA was 3.7 on 09/02/2019, 3.8 on 02/17/2020 and 3.1 on 05/13/2020.  LDH was 206 on 09/23/2019, 144 on 10/16/2019 and 102 on 05/13/2020.  He is s/p 4 cycles of carboplatin, etoposide, and Tecentriq (09/02/2019- 11/11/2019) with Margarette Canada support.  He is s/p 6 cycles of maintenance Tecentriq (12/02/2019 - 02/17/2020; 04/22/2020 - 05/13/2020).  He received PCI from 01/09/2020 - 01/22/2020.   Head MRI on 03/10/2020 revealed no evidence of intracranial metastatic disease. There were remote small infarcts in the left corona radiata and left thalamus, unchanged. There was stable moderate white matter disease, likely representing chronic small vessel ischemic disease. There were bilateral mastoid effusions.  Cervical spine MRI on 03/10/2020 revealed no evidence of metastatic disease to the cervical spine. There were multilevel degenerative changes of the cervical spine, worst at C5-C6 where there was moderate spinal canal stenosis and severe bilateral neural foraminal narrowing. There was severe right neural foraminal narrowing at C4-5. There was moderate to severe left neural foraminal narrowing at C3-4.  PET scan on 03/19/2020 revealed mild patchy left lower lobe opacity, stable versus mildly improved, without appreciable hypermetabolism on PET. While a small focus of residual tumor was difficult to entirely exclude, for the most part this favored treated disease.  There were no findings  suspicious for metastatic disease.   He was [facility] to [facility] from 11/22/2019 - 11/25/2019 with a heroin overdose and aspiration pneumonia.  He received 1 unit of PRBCs.  He was treated with Zosyn.  He was discharged on Augmentin x5 days.  He has hyponatremiasecondary to SIADH.  He began sodium chloride tablets (1 gm TID) on 09/04/2019.  He has B12 deficiency.  B12 was 227 on 03/23/2020 and 646 on 05/13/2020.   He is on oral B12.  Folate was 19.2 on 04/22/2020.  He has left upper extremity symptoms c/w a left-sided C6 radiculopathy as well as ulnar neuropathy.  He has elevated LFTs. Hepatitis C antibody and hepatitis B core antibody total were positive on 12/23/2019.  Hepatitis B surface antigen, hepatitis B core IgM and hepatitis A IgM were negative.  Hepatitis B DNA was not detected. Hepatitis C RNA quantitative revealed 3.869 log 10 IU/mL (7400 IU/mL).  Hepatitis C genotype is 2b.  He has a significant family history of malignancy. A family member has had genetic testing (negative for Lynch syndrome).  Symptomatically,  He feels "good." He is eating well and is not sure why he is down 3 lbs.  Exam is stable.  Plan: 1.   Labs today: CBC with diff, CMP, Mg, TSH. 2. Metastatic small cell lung cancer Clinically, he denies any respiratory symptoms. He is s/p 4 cycles of carboplatin, etoposide and Tecentriq (last 11/11/2019). Chest CT angiogram plus abdomen and pelvis CT in 08/2021revealed  adramatic response to treatment. He completed PCI on 01/22/2020. He iss/p 6cycles of maintenance Tecentriq (last 0 05/13/2020). Cycle #2 maintenance Tecentriq was held secondary to increased LFTs on 12/30/2019. No evidence for Tecentriq induced hepatitis. Etiology felt secondary to possible alcohol or less likely hepatitis C.  Tecentriq was held secondary to unexplained  neuropathy in his left upper extremity. PET scan on 03/19/2020 revealed no clear evidence of recurrent disease.             Labs reviewed.  Cycle #7 Tecentriq today.    Discussed plans for restaging studies.    Discuss symptom management.  He has antiemetics at home to use on a prn bases.  Interventions are adequate.   3. History of atrial fibrillation/flutter He denies any bruising or bleeding.  Continue Eliquis. 4. Left C6 radiculopathy and ulnar neuropathy Left-sided symptoms appear related to possible nerve entrapment at the elbow.             Head MRI on 03/10/2020 revealed no evidence of intracranial metastatic disease.              Cervical spine MRI with and without contrast on 03/10/2020 confirmed no metastatic disease.                         There were multilevel degenerative changes, worst at C5-C6, with moderate spinal canal stenosis and severe bilateral neuroforaminal narrowing.                         There was severe right neural foraminal narrowing at C4-5.                          There was moderate to severe left neural foraminal narrowing at C3-4.             He is followed by Dr Manuella Ghazi and Dr Izora Ribas.  He is scheduled today to see Dr. Manuella Ghazi on 06/26/2020. 5.B12 deficiency B12 was 227 on 03/23/2020 and 646 on 05/13/2020.  Continue oral B12.  Check folate periodically. 6.    Hypomagnesemia             Magnesium 1.6 today.  Magnesium 1 gm IV today. 7.   Cycle #7 maintenance Tecentriq today. 8.   Magnesium 1 gm IV. 9.   Chest, abdomen, and pelvis CT on 06/22/2020. 10.   RTC in 3 weeks for MD assessment, labs (CBC with diff, CMP, Mg), and cycle #8 maintenance Tecentriq.  I discussed the assessment and treatment plan with the patient.  The patient was provided an opportunity to ask questions and all were answered.  The patient agreed with the plan and demonstrated an understanding of the instructions.  The patient was  advised to call back if the symptoms worsen or if the condition fails to improve as anticipated.   Lequita Asal, MD, PhD    06/03/2020 , 9:31 AM  I, Mirian Mo Tufford, am acting as Education administrator for [employer]. Mike Gip, MD, PhD.  I, Meegan Shanafelt C. Mike Gip, MD, have reviewed the above documentation for accuracy and completeness, and I agree with the above.

## 2020-06-03 ENCOUNTER — Inpatient Hospital Stay: Payer: Medicare Other

## 2020-06-03 ENCOUNTER — Other Ambulatory Visit: Payer: Self-pay

## 2020-06-03 ENCOUNTER — Other Ambulatory Visit: Payer: Self-pay | Admitting: Hematology and Oncology

## 2020-06-03 ENCOUNTER — Inpatient Hospital Stay (HOSPITAL_BASED_OUTPATIENT_CLINIC_OR_DEPARTMENT_OTHER): Payer: Medicare Other | Admitting: Hematology and Oncology

## 2020-06-03 ENCOUNTER — Encounter: Payer: Self-pay | Admitting: Hematology and Oncology

## 2020-06-03 VITALS — BP 158/72 | HR 62 | Temp 96.0°F | Resp 20

## 2020-06-03 VITALS — BP 135/98 | HR 75 | Temp 96.3°F | Resp 18 | Wt 129.0 lb

## 2020-06-03 DIAGNOSIS — E538 Deficiency of other specified B group vitamins: Secondary | ICD-10-CM

## 2020-06-03 DIAGNOSIS — C781 Secondary malignant neoplasm of mediastinum: Secondary | ICD-10-CM | POA: Diagnosis not present

## 2020-06-03 DIAGNOSIS — Z79899 Other long term (current) drug therapy: Secondary | ICD-10-CM | POA: Diagnosis not present

## 2020-06-03 DIAGNOSIS — M5412 Radiculopathy, cervical region: Secondary | ICD-10-CM

## 2020-06-03 DIAGNOSIS — I10 Essential (primary) hypertension: Secondary | ICD-10-CM | POA: Diagnosis not present

## 2020-06-03 DIAGNOSIS — C3490 Malignant neoplasm of unspecified part of unspecified bronchus or lung: Secondary | ICD-10-CM

## 2020-06-03 DIAGNOSIS — Z8673 Personal history of transient ischemic attack (TIA), and cerebral infarction without residual deficits: Secondary | ICD-10-CM | POA: Diagnosis not present

## 2020-06-03 DIAGNOSIS — I4891 Unspecified atrial fibrillation: Secondary | ICD-10-CM | POA: Diagnosis not present

## 2020-06-03 DIAGNOSIS — Z7952 Long term (current) use of systemic steroids: Secondary | ICD-10-CM | POA: Diagnosis not present

## 2020-06-03 DIAGNOSIS — C7951 Secondary malignant neoplasm of bone: Secondary | ICD-10-CM

## 2020-06-03 DIAGNOSIS — Z5112 Encounter for antineoplastic immunotherapy: Secondary | ICD-10-CM | POA: Diagnosis not present

## 2020-06-03 DIAGNOSIS — Z7901 Long term (current) use of anticoagulants: Secondary | ICD-10-CM | POA: Diagnosis not present

## 2020-06-03 DIAGNOSIS — C3412 Malignant neoplasm of upper lobe, left bronchus or lung: Secondary | ICD-10-CM | POA: Diagnosis not present

## 2020-06-03 DIAGNOSIS — F17210 Nicotine dependence, cigarettes, uncomplicated: Secondary | ICD-10-CM | POA: Diagnosis not present

## 2020-06-03 DIAGNOSIS — G629 Polyneuropathy, unspecified: Secondary | ICD-10-CM | POA: Diagnosis not present

## 2020-06-03 LAB — COMPREHENSIVE METABOLIC PANEL
ALT: 9 U/L (ref 0–44)
AST: 15 U/L (ref 15–41)
Albumin: 3.5 g/dL (ref 3.5–5.0)
Alkaline Phosphatase: 57 U/L (ref 38–126)
Anion gap: 8 (ref 5–15)
BUN: 8 mg/dL (ref 8–23)
CO2: 23 mmol/L (ref 22–32)
Calcium: 9.1 mg/dL (ref 8.9–10.3)
Chloride: 104 mmol/L (ref 98–111)
Creatinine, Ser: 0.74 mg/dL (ref 0.61–1.24)
GFR, Estimated: >60 mL/min (ref >60)
Glucose, Bld: 103 mg/dL — ABNORMAL HIGH (ref 70–99)
Potassium: 3.5 mmol/L (ref 3.5–5.1)
Sodium: 135 mmol/L (ref 135–145)
Total Bilirubin: 1.2 mg/dL (ref 0.3–1.2)
Total Protein: 6.7 g/dL (ref 6.5–8.1)

## 2020-06-03 LAB — CBC WITH DIFFERENTIAL/PLATELET
Abs Immature Granulocytes: 0.05 10*3/uL (ref 0.00–0.07)
Basophils Absolute: 0 10*3/uL (ref 0.0–0.1)
Basophils Relative: 0 %
Eosinophils Absolute: 0 10*3/uL (ref 0.0–0.5)
Eosinophils Relative: 0 %
HCT: 36.8 % — ABNORMAL LOW (ref 39.0–52.0)
Hemoglobin: 13.1 g/dL (ref 13.0–17.0)
Immature Granulocytes: 1 %
Lymphocytes Relative: 32 %
Lymphs Abs: 1.1 10*3/uL (ref 0.7–4.0)
MCH: 34.6 pg — ABNORMAL HIGH (ref 26.0–34.0)
MCHC: 35.6 g/dL (ref 30.0–36.0)
MCV: 97.1 fL (ref 80.0–100.0)
Monocytes Absolute: 0.4 10*3/uL (ref 0.1–1.0)
Monocytes Relative: 11 %
Neutro Abs: 2 10*3/uL (ref 1.7–7.7)
Neutrophils Relative %: 56 %
Platelets: 199 10*3/uL (ref 150–400)
RBC: 3.79 MIL/uL — ABNORMAL LOW (ref 4.22–5.81)
RDW: 14 % (ref 11.5–15.5)
WBC: 3.6 10*3/uL — ABNORMAL LOW (ref 4.0–10.5)
nRBC: 0 % (ref 0.0–0.2)

## 2020-06-03 LAB — MAGNESIUM: Magnesium: 1.6 mg/dL — ABNORMAL LOW (ref 1.7–2.4)

## 2020-06-03 LAB — LACTATE DEHYDROGENASE: LDH: 100 U/L (ref 98–192)

## 2020-06-03 MED ORDER — SODIUM CHLORIDE 0.9 % IV SOLN
1.0000 g | Freq: Once | INTRAVENOUS | Status: AC
  Administered 2020-06-03: 1 g via INTRAVENOUS
  Filled 2020-06-03: qty 2

## 2020-06-03 MED ORDER — HEPARIN SOD (PORK) LOCK FLUSH 100 UNIT/ML IV SOLN
INTRAVENOUS | Status: AC
  Filled 2020-06-03: qty 5

## 2020-06-03 MED ORDER — SODIUM CHLORIDE 0.9% FLUSH
10.0000 mL | INTRAVENOUS | Status: DC | PRN
  Filled 2020-06-03: qty 10

## 2020-06-03 MED ORDER — HEPARIN SOD (PORK) LOCK FLUSH 100 UNIT/ML IV SOLN
500.0000 [IU] | Freq: Once | INTRAVENOUS | Status: AC | PRN
  Administered 2020-06-03: 500 [IU]
  Filled 2020-06-03: qty 5

## 2020-06-03 MED ORDER — SODIUM CHLORIDE 0.9 % IV SOLN
Freq: Once | INTRAVENOUS | Status: AC
  Filled 2020-06-03: qty 250

## 2020-06-03 MED ORDER — SODIUM CHLORIDE 0.9 % IV SOLN
1200.0000 mg | Freq: Once | INTRAVENOUS | Status: AC
  Administered 2020-06-03: 1200 mg via INTRAVENOUS
  Filled 2020-06-03: qty 20

## 2020-06-04 LAB — CEA: CEA: 3.3 ng/mL (ref 0.0–4.7)

## 2020-06-22 ENCOUNTER — Ambulatory Visit: Admission: RE | Admit: 2020-06-22 | Payer: Medicare Other | Source: Ambulatory Visit

## 2020-06-23 ENCOUNTER — Ambulatory Visit
Admission: RE | Admit: 2020-06-23 | Discharge: 2020-06-23 | Disposition: A | Discharge status: Home / Self Care | Payer: Medicare Other | Source: Ambulatory Visit | Attending: Hematology and Oncology | Admitting: Hematology and Oncology

## 2020-06-23 ENCOUNTER — Other Ambulatory Visit: Payer: Self-pay

## 2020-06-23 DIAGNOSIS — C3490 Malignant neoplasm of unspecified part of unspecified bronchus or lung: Secondary | ICD-10-CM | POA: Insufficient documentation

## 2020-06-23 DIAGNOSIS — I700 Atherosclerosis of aorta: Secondary | ICD-10-CM | POA: Diagnosis not present

## 2020-06-23 DIAGNOSIS — K8020 Calculus of gallbladder without cholecystitis without obstruction: Secondary | ICD-10-CM | POA: Diagnosis not present

## 2020-06-23 DIAGNOSIS — J432 Centrilobular emphysema: Secondary | ICD-10-CM | POA: Diagnosis not present

## 2020-06-23 DIAGNOSIS — N281 Cyst of kidney, acquired: Secondary | ICD-10-CM | POA: Diagnosis not present

## 2020-06-23 DIAGNOSIS — K8080 Other cholelithiasis without obstruction: Secondary | ICD-10-CM | POA: Diagnosis not present

## 2020-06-23 DIAGNOSIS — I2510 Atherosclerotic heart disease of native coronary artery without angina pectoris: Secondary | ICD-10-CM | POA: Diagnosis not present

## 2020-06-23 MED ORDER — IOHEXOL 300 MG/ML  SOLN
100.0000 mL | Freq: Once | INTRAMUSCULAR | Status: AC | PRN
  Administered 2020-06-23: 75 mL via INTRAVENOUS

## 2020-06-23 NOTE — Progress Notes (Signed)
[facility]  [address] [place], [place] [address] Phone: [phone]  Fax: [phone]   Clinic Day:  06/24/20  Referring physician: Lequita Asal, MD  Chief Complaint: Scott Jordan is a 70 y.o. male with extensive stage small cell lung cancer who is seen for assessment prior to cycle #8 maintenance Tecentriq.  HPI: The patient was last seen in the medical oncology clinic on 06/03/2020. At that time, he felt "good." He was eating well and was not sure why he was down 3 lbs. Exam was stable. Hematocrit was 36.8, hemoglobin 13.1, platelets 199,000, WBC 3,600. CMP was normal. Magnesium was 1.6. CEA was 3.3 and LDH 100. He received 1 gm IV magnesium. He received cycle #7 maintenance Tecentriq.  Chest, abdomen, and pelvis CT on 06/23/2020 revealed continued volume loss and confluent bandlike airspace opacity at the left lung base in the lower lobes suggesting prior regional therapy and/or scarring. There was a new 0.6 x 0.4 cm pleural-based nodule in the left upper lobe adjacent to the lingula, probably benign/inflammatory but merits surveillance.  The previous metastatic lesions to the left iliac bone and ischium are no longer well seen on CT. No new metastatic lesion were identified.  Other imaging findings of potential clinical significance: coronary atherosclerosis, borderline cardiomegaly, contrast medium in the distal esophagus possibly from dysmotility or reflux, cholelithiasis, right mid kidney cyst, and emphysema and aortic atherosclerosis.  During the interim, he has been "good." He is tolerating treatment well without side effects. He is seeing the neurologist on Friday. He still has left hand pain with limited function. The shooting pains in his left arm have improved. His right hand is numb but the pain in his right arm has resolved. His shortness of breath, cough, and phlegm are stable. He still gets dizzy when he stands up.  The patient fell  in the lobby on 03/24/2020. His right ear has been "stopped up" since the fall.  His weight is down 3 lbs. He drinks 2 Ensures/Boosts per day. He denies alcohol and drug use.  He ran out of salt tablets yesterday. He takes three per day.   Past Medical History:  Diagnosis Date  . A-fib ([place]) 01/10/2019   pt st this was dx by Dr. Ubaldo Glassing  . Cancer ([place]) 2021   LUNG  . Complication of anesthesia    DIFFICULTY WAKING UP AFTER SURGERY- 20 YRS AGO  . Hypertension   . Lung cancer ([place])   . Substance abuse [facility])     Past Surgical History:  Procedure Laterality Date  . ARM DEBRIDEMENT Left    INCISION AND DEBRIDEMENT LOWER ARM -20 YRS AGO  . BACK SURGERY    . PORTACATH PLACEMENT Left 08/29/2019   Procedure: INSERTION PORT-A-CATH;  Surgeon: Nestor Lewandowsky, MD;  Location: [facility] ORS;  Service: General;  Laterality: Left;    History reviewed. No pertinent family history.  Social History:  reports that he has quit smoking. His smoking use included cigarettes. He smoked 0.50 packs per day. He has never used smokeless tobacco. He reports current alcohol use. He reports previous drug use. Drug: Heroin. The patient denies any exposure to radiation or toxins. The patient lives in [place]. He is dependent on public transportation or transportation from friends/family. He has family friends staying with him to help care for his needs.His niece, [name] [phone]), is his medical power of attorney. The patient is alone today.   Allergies: No Known Allergies  Current Medications: Current  Outpatient Medications  Medication Sig Dispense Refill  . calcium carbonate (OS-CAL) 600 MG TABS tablet Take 600 mg by mouth daily with breakfast.    . cholecalciferol (VITAMIN D3) 25 MCG (1000 UNIT) tablet Take 1,000 Units by mouth daily.    . Cyanocobalamin (VITAMIN B 12) 500 MCG TABS Take by mouth.    . diltiazem (TIAZAC) 240 MG 24 hr capsule Take 1 capsule by mouth daily.    Marland Kitchen ELIQUIS 5 MG TABS tablet Take 5  mg by mouth 2 (two) times daily.    . feeding supplement, ENSURE ENLIVE, (ENSURE ENLIVE) LIQD Take 237 mLs by mouth 3 (three) times daily between meals. 237 mL 12  . Magnesium 500 MG CAPS Take 500 mg by mouth daily.    . potassium chloride SA (KLOR-CON) 20 MEQ tablet TAKE 1 TABLET BY MOUTH DAILY FOR 3 DAYS (Patient taking differently: Take 20 mEq by mouth daily.) 3 tablet 0  . dexamethasone (DECADRON) 4 MG tablet Take 1 tablet (4 mg total) by mouth daily. (Patient not taking: No sig reported) 25 tablet 0  . escitalopram (LEXAPRO) 10 MG tablet Take 1 tablet (10 mg total) by mouth daily. (Patient not taking: No sig reported) 90 tablet 0  . lidocaine-prilocaine (EMLA) cream Apply to affected area once (Patient not taking: No sig reported) 30 g 3  . naloxone (NARCAN) nasal spray 4 mg/0.1 mL once as needed (Patient not taking: No sig reported)    . nystatin (MYCOSTATIN) 100000 UNIT/ML suspension Use as directed 5 mLs (500,000 Units total) in the mouth or throat 4 (four) times daily. Swish and spit (Patient not taking: No sig reported) 60 mL 0  . ondansetron (ZOFRAN) 8 MG tablet Take 1 tablet (8 mg total) by mouth every 8 (eight) hours as needed for refractory nausea / vomiting. Start on day 3 after carboplatin chemo. (Patient not taking: No sig reported) 30 tablet 1  . sodium chloride 1 g tablet Take 1 tablet (1 g total) by mouth 3 (three) times daily with meals. 90 tablet 1   No current facility-administered medications for this visit.   Facility-Administered Medications Ordered in Other Visits  Medication Dose Route Frequency Provider Last Rate Last Admin  . heparin lock flush 100 unit/mL  500 Units Intravenous Once Corcoran, Melissa C, MD      . sodium chloride flush (NS) 0.9 % injection 10 mL  10 mL Intravenous PRN Lequita Asal, MD   10 mL at 11/11/19 1540    Review of Systems  Constitutional: Positive for weight loss (3 lbs). Negative for chills, diaphoresis, fever and malaise/fatigue.        Feels "good."  HENT: Negative for congestion, ear discharge, ear pain, hearing loss, nosebleeds, sinus pain, sore throat and tinnitus.        Right ear "stopped up."  Eyes: Negative.  Negative for blurred vision and double vision.  Respiratory: Positive for cough, sputum production (phlegm) and shortness of breath (on exertion). Negative for hemoptysis.   Cardiovascular: Negative.  Negative for chest pain, palpitations and leg swelling.  Gastrointestinal: Negative.  Negative for abdominal pain, blood in stool, constipation, diarrhea, heartburn, melena, nausea and vomiting.  Genitourinary: Negative.  Negative for dysuria, frequency, hematuria and urgency.  Musculoskeletal: Negative.  Negative for back pain, joint pain, myalgias and neck pain.  Skin: Negative for itching and rash.  Neurological: Positive for dizziness (when he stands up, improved) and sensory change (pain in entire left arm, improved. Right hand numbness.). Negative for  tingling, weakness and headaches.  Endo/Heme/Allergies: Negative.   Psychiatric/Behavioral: Negative for depression, memory loss and substance abuse (denies drug and alcohol use). The patient is not nervous/anxious and does not have insomnia.   All other systems reviewed and are negative.  Performance status (ECOG): 1  Vitals Blood pressure (!) 144/91, pulse 94, temperature (!) 96.8 F (36 C), temperature source Tympanic, weight 126 lb 12.2 oz (57.5 kg).   Physical Exam Vitals and nursing note reviewed.  Constitutional:      General: He is not in acute distress.    Appearance: Normal appearance. He is not ill-appearing, toxic-appearing or diaphoretic.     Interventions: Face mask in place.  HENT:     Head: Normocephalic and atraumatic.     Comments: Graying beard.    Mouth/Throat:     Mouth: Mucous membranes are moist.     Pharynx: Oropharynx is clear. No oropharyngeal exudate or posterior oropharyngeal erythema.  Eyes:     General: No scleral  icterus.    Extraocular Movements: Extraocular movements intact.     Conjunctiva/sclera: Conjunctivae normal.     Pupils: Pupils are equal, round, and reactive to light.     Comments: Blue eyes.  Cardiovascular:     Rate and Rhythm: Normal rate and regular rhythm.     Heart sounds: No murmur heard. No gallop.   Pulmonary:     Effort: Pulmonary effort is normal. No respiratory distress.     Breath sounds: No wheezing or rales.  Chest:     Chest wall: No tenderness.  Breasts:     Right: No axillary adenopathy or supraclavicular adenopathy.     Left: No axillary adenopathy or supraclavicular adenopathy.    Abdominal:     General: Bowel sounds are normal. There is no distension.     Palpations: Abdomen is soft. There is no hepatomegaly, splenomegaly or mass.     Tenderness: There is no abdominal tenderness. There is no guarding or rebound.  Musculoskeletal:        General: No swelling or tenderness. Normal range of motion.     Cervical back: Normal range of motion.     Right lower leg: No edema.     Left lower leg: No edema.  Lymphadenopathy:     Head:     Right side of head: No preauricular, posterior auricular or occipital adenopathy.     Left side of head: No preauricular, posterior auricular or occipital adenopathy.     Cervical: No cervical adenopathy.     Upper Body:     Right upper body: No supraclavicular or axillary adenopathy.     Left upper body: No supraclavicular or axillary adenopathy.     Lower Body: No right inguinal adenopathy. No left inguinal adenopathy.  Skin:    General: Skin is warm and dry.     Findings: No bruising, erythema or rash.  Neurological:     Mental Status: He is alert and oriented to person, place, and time.     Sensory: No sensory deficit.  Psychiatric:        Mood and Affect: Mood normal.        Behavior: Behavior normal.        Thought Content: Thought content normal.        Judgment: Judgment normal.     Imaging  studies: 08/19/2019:  Chest CT without contrast revealed the left pleural pigtail drain. There was a small residual left hydropneumothorax. There was a 6.7 x 4 cm  medial basilar left lower lobe lung masssuspicious for primary bronchogenic carcinoma. There was a lingular 1.0 cm solid pulmonary nodulesuspicious for ipsilateral metastasis. There was intralobular septal thickening and patchy groundglass opacity throughout the lingula and the left lower lobe with moderate residual atelectasis of the left lung base. A component of lymphangitic carcinomatosis was suspected. There was ipsilateral infra hilar, subcarinal, AP window, prevascular and upper retroperitoneal adenopathyc/wmetastatic disease. 08/21/2019:  Head MRI revealed no evidence of metastatic disease. 08/29/2019:  PET scan revealed markedly hypermetabolic left lower lobe pulmonary lesion (SUV 7.9) c/w the patient's known malignancy. There was associated hypermetabolic nodal metastases in the mediastinum and left hilum with metastatic involvement of left pleural space. Additional sites included upper abdominal hypermetabolic lymphadenopathy and scattered hypermetabolic bone metastases (left acetabulum, sacrum, and L5). There was interval decrease in left pleural effusion with areas of loculation. 10/11/2019:  Chest, abdomen, and pelvis CT revealed considerable reduction in size of prior hypermetabolic lymph nodes in the chest and abdomen, compatible with response to therapy. There was notably reduced size of the left pleural effusion although some complexity or loculation persists. The previous areas of hypermetabolic nodularity along the pleural surface were not obviously nodular along the pleural surface. There was considerable reduction in size of the previously consolidated/collapsed left lower lobe, although there was still some residual indistinct opacity in this region. The previous hypermetabolic osseous metastatic lesions were relatively  occult.  There was mild airway thickening was present, suggesting bronchitis or reactive airways disease. There was trace pelvic ascites, improved from prior. There was chronic deformity of the left medial clavicle likely due to an old fracture with some deformity of the left sternoclavicular joint. 11/23/2019: Chest CT angiogram revealed no pulmonary embolus.  There was new patchy and ground-glass opacity in the dependent right lower lobe, perifissural right middle and right upper lobe. Findings were suspicious for pneumonia, including aspiration or atypical viral organisms such as COVID-19. There was a small left pleural effusion with slight increase size from prior exam. Volume loss in the left lower lobe with areas of ill-defined consolidation, was not significantly changed from prior. Right hilar adenopathy was new from prior exam, nonspecific in this setting. Recommend attention at follow-up. There was debris within the trachea and to a lesser extent right mainstem bronchus. There was right lower lobe bronchial thickening with areas of mucous plugging.  11/29/2019:  Abdomen and pelvis CT revealed residual patchy nodular foci of consolidation at the right lung base, overall decreased since 11/22/2019 favoring resolving bronchopneumonia.  Follow-up chest CT was advised.  There was a stable small dependent left pleural effusion and stable top normal size previously hypermetabolic left periaortic lymph node.  There was stable faint sclerotic left pelvic bone lesions.  There were no new or progressive metastatic disease in the abdomen or pelvis. 03/09/2020:  CXR showed left base atelectasis with small left pleural effusion, resent on prior CT examination. There was no new opacity evident.  There was a stable cardiac silhouette. There was no adenopathy evident. Port-A-Cath tip was at the cavoatrial junction. 03/10/2020:  Head MRI with and without contrast revealed no evidence of intracranial metastatic disease.  There were remote small infarcts in the left corona radiata and left thalamus, unchanged. There was stable moderate white matter disease, likely representing chronic small vessel ischemic disease. There were bilateral mastoid effusions. 03/10/2020:  Cervical spine MRI with and without contrast revealed no evidence of metastatic disease to the cervical spine. There were multilevel degenerative changes of the cervical spine,  worst at C5-C6 where there was moderate spinal canal stenosis and severe bilateral neural foraminal narrowing. There was severe right neural foraminal narrowing at C4-5. There was moderate to severe left neural foraminal narrowing at C3-4. 03/19/2020:  PET scan revealed mild patchy left lower lobe opacity, stable versus mildly improved, without appreciable hypermetabolism on PET. While a small focus of residual tumor was difficult to entirely exclude, for the most part this favored treated disease.  There were no findings suspicious for metastatic disease. 06/23/2020:  Chest, abdomen, and pelvis CT revealed continued volume loss and confluent bandlike airspace opacity at the left lung base in the lower lobes suggesting prior regional therapy and/or scarring.  There was a new 0.6 x 0.4 cm pleural-based nodule in the left upper lobe adjacent to the lingula, probably benign/inflammatory but merits surveillance.  The previous metastatic lesions to the left iliac bone and ischium are no longer well seen on CT. No new metastatic lesion were identified.  Other imaging findings of potential clinical significance: coronary atherosclerosis, borderline cardiomegaly, contrast medium in the distal esophagus possibly from dysmotility or reflux, cholelithiasis, right mid kidney cyst, and emphysema and aortic atherosclerosis.   Appointment on 06/24/2020  Component Date Value Ref Range Status  . WBC 06/24/2020 4.5  4.0 - 10.5 K/uL Final  . RBC 06/24/2020 4.14* 4.22 - 5.81 MIL/uL Final  . Hemoglobin  06/24/2020 14.6  13.0 - 17.0 g/dL Final  . HCT 06/24/2020 40.9  39.0 - 52.0 % Final  . MCV 06/24/2020 98.8  80.0 - 100.0 fL Final  . MCH 06/24/2020 35.3* 26.0 - 34.0 pg Final  . MCHC 06/24/2020 35.7  30.0 - 36.0 g/dL Final  . RDW 06/24/2020 13.2  11.5 - 15.5 % Final  . Platelets 06/24/2020 209  150 - 400 K/uL Final  . nRBC 06/24/2020 0.0  0.0 - 0.2 % Final  . Neutrophils Relative % 06/24/2020 56  % Final  . Neutro Abs 06/24/2020 2.5  1.7 - 7.7 K/uL Final  . Lymphocytes Relative 06/24/2020 31  % Final  . Lymphs Abs 06/24/2020 1.4  0.7 - 4.0 K/uL Final  . Monocytes Relative 06/24/2020 12  % Final  . Monocytes Absolute 06/24/2020 0.5  0.1 - 1.0 K/uL Final  . Eosinophils Relative 06/24/2020 0  % Final  . Eosinophils Absolute 06/24/2020 0.0  0.0 - 0.5 K/uL Final  . Basophils Relative 06/24/2020 0  % Final  . Basophils Absolute 06/24/2020 0.0  0.0 - 0.1 K/uL Final  . Immature Granulocytes 06/24/2020 1  % Final  . Abs Immature Granulocytes 06/24/2020 0.04  0.00 - 0.07 K/uL Final   Performed at [facility], [address], [place], [place] [address]    Assessment:  Scott Jordan is a 70 y.o. male with extensive stage small cell lung cancer. He has a >40 pack year smoking. He presented with a recurrent left sided pleural effusion. He has undergone thoracentesis x 2 in the past month. He is s/p pigtail drain. Initial cytology x 2 was negative (lymphocytic exudative effusion).Pleural fluid cytologyon 08/18/2019 confirmed small cell lung cancer.  Chest CT without contraston 08/19/2019 revealed the left pleural pigtail drain. There was a small residual left hydropneumothorax. There was a 6.7 x 4 cm medial basilar left lower lobe lung masssuspicious for primary bronchogenic carcinoma. There was a lingular 1.0 cm solid pulmonary nodulesuspicious for ipsilateral metastasis. There was intralobular septal thickening and patchy groundglass opacity throughout the lingula and the  left lower lobe with moderate  residual atelectasis of the left lung base. A component of lymphangitic carcinomatosis was suspected. There was ipsilateral infra hilar, subcarinal, AP window, prevascular and upper retroperitoneal adenopathyc/wmetastatic disease.  Head MRI on 08/21/2019 revealed no evidence of metastatic disease.  PET scan on 05/20/20201 revealed markedly hypermetabolic left lower lobe pulmonary lesion (SUV 7.9) c/w the patient's known malignancy. There was associated hypermetabolic nodal metastases in the mediastinum and left hilum with metastatic involvement of left pleural space. Additional sites included upper abdominal hypermetabolic lymphadenopathy and scattered hypermetabolic bone metastases (left acetabulum, sacrum, and L5). There was interval decrease in left pleural effusion with areas of loculation.  CEA was 3.7 on 09/02/2019, 3.8 on 02/17/2020 and 3.1 on 05/13/2020.  LDH was 206 on 09/23/2019, 144 on 10/16/2019 and 102 on 05/13/2020.  He is s/p 4 cycles of carboplatin, etoposide, and Tecentriq (09/02/2019- 11/11/2019) with Margarette Canada support.  He is s/p 7 cycles of maintenance Tecentriq (12/02/2019 - 02/17/2020; 04/22/2020 - 06/03/2020).  He received PCI from 01/09/2020 - 01/22/2020.   Head MRI on 03/10/2020 revealed no evidence of intracranial metastatic disease. There were remote small infarcts in the left corona radiata and left thalamus, unchanged. There was stable moderate white matter disease, likely representing chronic small vessel ischemic disease. There were bilateral mastoid effusions.  Cervical spine MRI on 03/10/2020 revealed no evidence of metastatic disease to the cervical spine. There were multilevel degenerative changes of the cervical spine, worst at C5-C6 where there was moderate spinal canal stenosis and severe bilateral neural foraminal narrowing. There was severe right neural foraminal narrowing at C4-5. There was moderate to severe left neural foraminal  narrowing at C3-4.  Chest, abdomen, and pelvis CT on 06/23/2020 revealed continued volume loss and confluent bandlike airspace opacity at the left lung base in the lower lobes suggesting prior regional therapy and/or scarring. There was a new 0.6 x 0.4 cm pleural-based nodule in the left upper lobe adjacent to the lingula, probably benign/inflammatory but merits surveillance.  The previous metastatic lesions to the left iliac bone and ischium are no longer well seen on CT. No new metastatic lesion were identified.    He was admitted to [facility] from 11/22/2019 - 11/25/2019 with a heroin overdose and aspiration pneumonia.  He received 1 unit of PRBCs.  He was treated with Zosyn.  He was discharged on Augmentin x5 days.  He has hyponatremiasecondary to SIADH.  He began sodium chloride tablets (1 gm TID) on 09/04/2019.  He has B12 deficiency.  B12 was 227 on 03/23/2020 and 646 on 05/13/2020.   He is on oral B12.  Folate was 19.2 on 04/22/2020.  He has left upper extremity symptoms c/w a left-sided C6 radiculopathy as well as ulnar neuropathy.  He has elevated LFTs. Hepatitis C antibody and hepatitis B core antibody total were positive on 12/23/2019.  Hepatitis B surface antigen, hepatitis B core IgM and hepatitis A IgM were negative.  Hepatitis B DNA was not detected. Hepatitis C RNA quantitative revealed 3.869 log 10 IU/mL (7400 IU/mL).  Hepatitis C genotype is 2b.  He has a significant family history of malignancy. A family member has had genetic testing (negative for Lynch syndrome).  Symptomatically,  he feels "good."  He still has left hand pain with limited function. The shooting pains in his left arm have improved.  He is dizzy on standing.  Exam is stable.  Plan: 1.   Labs today: CBC with diff, CMP, Mg, TSH. 2. Metastatic small cell lung cancer Clinically, he denies any respiratory symptoms.  He is s/p 4 cycles of carboplatin, etoposide and Tecentriq (last  11/11/2019). Chest CT angiogram plus abdomen and pelvis CT in 08/2021revealed adramatic response to treatment. He completed PCI on 01/22/2020. He iss/p 7cycles of maintenance Tecentriq (last 06/03/2020). Cycle #2 maintenance Tecentriq was held secondary to increased LFTs on 12/30/2019. No evidence for Tecentriq induced hepatitis. Etiology felt secondary to possible alcohol or less likely hepatitis C.  Tecentriq was held secondary to unexplained neuropathy in his left upper extremity. PET scan on 03/19/2020 revealed no clear evidence of recurrent disease.  Chest, abdomen, and pelvis CT on 06/23/2020 was personally reviewed.  Agree with radiology findings.     Imaging suggests scarring secondary to radiation.    There was a new 0.6 x 0.4 cm pleural-based nodule in the left upper lobe adjacent to the lingula of unclear significance.     There were no new metastatic lesions.               Labs reviewed.  Cycle #8 Tecentriq today.    Discuss symptom management.  He has antiemetics at home to use on a prn bases.  Interventions are adequate. 3. History of atrial fibrillation/flutter He denies any bruising or bleeding.  Continue Eliquis. 4. Left C6 radiculopathy and ulnar neuropathy Left-sided symptoms appear related to possible nerve entrapment at the elbow.             Head MRI on 03/10/2020 revealed no evidence of intracranial metastatic disease.              Cervical spine MRI with and without contrast on 03/10/2020 confirmed no metastatic disease.                         There were multilevel degenerative changes, worst at C5-C6, with moderate spinal canal stenosis and severe bilateral neuroforaminal narrowing.                         There was severe right neural foraminal narrowing at C4-5.                          There was moderate to severe  left neural foraminal narrowing at C3-4.             He is followed by Dr Manuella Ghazi and Dr Izora Ribas.  Follow-up with Dr. Manuella Ghazi on 06/26/2020. 5.   B12 deficiency B12 was 227 on 03/23/2020 and 646 on 05/13/2020.  Continue oral B12.  Check B12 and folate periodically. 6.   Hypomagnesemia             Magnesium 1.6 today.  Magnesium 1 g IV today. 7.   Hyponatremia, improved  Sodium 137.  Refill salt tablets with plan to taper. 8.   Dizzy on standing s/p fall  Head CT without contrast. 9.   Cycle #8 maintenance Tecentriq. 10.   RN:  Please provide patient with Ensure/Boost. 11.   Head CT without contrast today. 12.   RTC in 3 weeks for MD assessment, labs (CBC with diff, CMP, Mg), and cycle #9 maintenance Tecentriq.  I discussed the assessment and treatment plan with the patient.  The patient was provided an opportunity to ask questions and all were answered.  The patient agreed with the plan and demonstrated an understanding of the instructions.  The patient was advised to call back if the symptoms worsen or if the condition fails to improve  as anticipated.   Lequita Asal, MD, PhD    06/24/2020 , 9:53 AM  I, Mirian Mo Tufford, am acting as Education administrator for [employer]. Mike Gip, MD, PhD.  I, Melissa C. Mike Gip, MD, have reviewed the above documentation for accuracy and completeness, and I agree with the above.

## 2020-06-24 ENCOUNTER — Inpatient Hospital Stay: Payer: Medicare Other

## 2020-06-24 ENCOUNTER — Inpatient Hospital Stay (HOSPITAL_BASED_OUTPATIENT_CLINIC_OR_DEPARTMENT_OTHER): Payer: Medicare Other | Admitting: Hematology and Oncology

## 2020-06-24 ENCOUNTER — Other Ambulatory Visit: Payer: Self-pay

## 2020-06-24 ENCOUNTER — Ambulatory Visit
Admission: RE | Admit: 2020-06-24 | Discharge: 2020-06-24 | Disposition: A | Discharge status: Home / Self Care | Payer: Medicare Other | Source: Ambulatory Visit | Attending: Hematology and Oncology | Admitting: Hematology and Oncology

## 2020-06-24 ENCOUNTER — Telehealth: Payer: Self-pay

## 2020-06-24 ENCOUNTER — Other Ambulatory Visit: Payer: Self-pay | Admitting: Hematology and Oncology

## 2020-06-24 ENCOUNTER — Encounter: Payer: Self-pay | Admitting: Hematology and Oncology

## 2020-06-24 ENCOUNTER — Inpatient Hospital Stay: Payer: Medicare Other | Attending: Hematology and Oncology

## 2020-06-24 VITALS — BP 144/91 | HR 94 | Temp 96.8°F | Wt 126.8 lb

## 2020-06-24 DIAGNOSIS — C781 Secondary malignant neoplasm of mediastinum: Secondary | ICD-10-CM | POA: Insufficient documentation

## 2020-06-24 DIAGNOSIS — I10 Essential (primary) hypertension: Secondary | ICD-10-CM | POA: Diagnosis not present

## 2020-06-24 DIAGNOSIS — I4891 Unspecified atrial fibrillation: Secondary | ICD-10-CM | POA: Diagnosis not present

## 2020-06-24 DIAGNOSIS — C3490 Malignant neoplasm of unspecified part of unspecified bronchus or lung: Secondary | ICD-10-CM

## 2020-06-24 DIAGNOSIS — E222 Syndrome of inappropriate secretion of antidiuretic hormone: Secondary | ICD-10-CM | POA: Diagnosis not present

## 2020-06-24 DIAGNOSIS — E538 Deficiency of other specified B group vitamins: Secondary | ICD-10-CM

## 2020-06-24 DIAGNOSIS — E871 Hypo-osmolality and hyponatremia: Secondary | ICD-10-CM | POA: Diagnosis not present

## 2020-06-24 DIAGNOSIS — C7951 Secondary malignant neoplasm of bone: Secondary | ICD-10-CM | POA: Diagnosis not present

## 2020-06-24 DIAGNOSIS — S0990XA Unspecified injury of head, initial encounter: Secondary | ICD-10-CM

## 2020-06-24 DIAGNOSIS — I4892 Unspecified atrial flutter: Secondary | ICD-10-CM | POA: Insufficient documentation

## 2020-06-24 DIAGNOSIS — Z5112 Encounter for antineoplastic immunotherapy: Secondary | ICD-10-CM

## 2020-06-24 DIAGNOSIS — C3412 Malignant neoplasm of upper lobe, left bronchus or lung: Secondary | ICD-10-CM | POA: Diagnosis not present

## 2020-06-24 DIAGNOSIS — Z87891 Personal history of nicotine dependence: Secondary | ICD-10-CM | POA: Diagnosis not present

## 2020-06-24 DIAGNOSIS — R519 Headache, unspecified: Secondary | ICD-10-CM | POA: Diagnosis not present

## 2020-06-24 DIAGNOSIS — C3432 Malignant neoplasm of lower lobe, left bronchus or lung: Secondary | ICD-10-CM | POA: Diagnosis present

## 2020-06-24 DIAGNOSIS — Z7901 Long term (current) use of anticoagulants: Secondary | ICD-10-CM | POA: Diagnosis not present

## 2020-06-24 DIAGNOSIS — M5412 Radiculopathy, cervical region: Secondary | ICD-10-CM

## 2020-06-24 LAB — COMPREHENSIVE METABOLIC PANEL
ALT: 13 U/L (ref 0–44)
AST: 24 U/L (ref 15–41)
Albumin: 3.9 g/dL (ref 3.5–5.0)
Alkaline Phosphatase: 61 U/L (ref 38–126)
Anion gap: 12 (ref 5–15)
BUN: 8 mg/dL (ref 8–23)
CO2: 23 mmol/L (ref 22–32)
Calcium: 9.7 mg/dL (ref 8.9–10.3)
Chloride: 102 mmol/L (ref 98–111)
Creatinine, Ser: 0.86 mg/dL (ref 0.61–1.24)
GFR, Estimated: >60 mL/min (ref >60)
Glucose, Bld: 141 mg/dL — ABNORMAL HIGH (ref 70–99)
Potassium: 3.6 mmol/L (ref 3.5–5.1)
Sodium: 137 mmol/L (ref 135–145)
Total Bilirubin: 1.2 mg/dL (ref 0.3–1.2)
Total Protein: 7.2 g/dL (ref 6.5–8.1)

## 2020-06-24 LAB — CBC WITH DIFFERENTIAL/PLATELET
Abs Immature Granulocytes: 0.04 10*3/uL (ref 0.00–0.07)
Basophils Absolute: 0 10*3/uL (ref 0.0–0.1)
Basophils Relative: 0 %
Eosinophils Absolute: 0 10*3/uL (ref 0.0–0.5)
Eosinophils Relative: 0 %
HCT: 40.9 % (ref 39.0–52.0)
Hemoglobin: 14.6 g/dL (ref 13.0–17.0)
Immature Granulocytes: 1 %
Lymphocytes Relative: 31 %
Lymphs Abs: 1.4 10*3/uL (ref 0.7–4.0)
MCH: 35.3 pg — ABNORMAL HIGH (ref 26.0–34.0)
MCHC: 35.7 g/dL (ref 30.0–36.0)
MCV: 98.8 fL (ref 80.0–100.0)
Monocytes Absolute: 0.5 10*3/uL (ref 0.1–1.0)
Monocytes Relative: 12 %
Neutro Abs: 2.5 10*3/uL (ref 1.7–7.7)
Neutrophils Relative %: 56 %
Platelets: 209 10*3/uL (ref 150–400)
RBC: 4.14 MIL/uL — ABNORMAL LOW (ref 4.22–5.81)
RDW: 13.2 % (ref 11.5–15.5)
WBC: 4.5 10*3/uL (ref 4.0–10.5)
nRBC: 0 % (ref 0.0–0.2)

## 2020-06-24 LAB — MAGNESIUM: Magnesium: 1.6 mg/dL — ABNORMAL LOW (ref 1.7–2.4)

## 2020-06-24 LAB — TSH: TSH: 1.41 u[IU]/mL (ref 0.350–4.500)

## 2020-06-24 MED ORDER — SODIUM CHLORIDE 1 G PO TABS: 1.0000 g | ORAL_TABLET | Freq: Three times a day (TID) | ORAL | 1 refills | Status: DC

## 2020-06-24 MED ORDER — SODIUM CHLORIDE 0.9 % IV SOLN
1200.0000 mg | Freq: Once | INTRAVENOUS | Status: AC
  Administered 2020-06-24: 1200 mg via INTRAVENOUS
  Filled 2020-06-24: qty 20

## 2020-06-24 MED ORDER — SODIUM CHLORIDE 0.9 % IV SOLN
1.0000 g | Freq: Once | INTRAVENOUS | Status: AC
  Administered 2020-06-24: 1 g via INTRAVENOUS
  Filled 2020-06-24: qty 2

## 2020-06-24 MED ORDER — HEPARIN SOD (PORK) LOCK FLUSH 100 UNIT/ML IV SOLN
500.0000 [IU] | Freq: Once | INTRAVENOUS | Status: DC | PRN
  Filled 2020-06-24: qty 5

## 2020-06-24 MED ORDER — SODIUM CHLORIDE 0.9 % IV SOLN
Freq: Once | INTRAVENOUS | Status: AC
  Filled 2020-06-24: qty 250

## 2020-06-24 MED ORDER — HEPARIN SOD (PORK) LOCK FLUSH 100 UNIT/ML IV SOLN
INTRAVENOUS | Status: AC
  Filled 2020-06-24: qty 5

## 2020-06-24 NOTE — Telephone Encounter (Signed)
Patient aware.

## 2020-06-24 NOTE — Telephone Encounter (Signed)
-----   Message from Lequita Asal, MD sent at 06/24/2020 12:52 PM EDT ----- Regarding: Please let patient know head CT is ok  ----- Message ----- From: Interface, Rad Results In Sent: 06/24/2020  12:23 PM EDT To: Lequita Asal, MD

## 2020-06-24 NOTE — Progress Notes (Signed)
Patient here today for follow up and treatment consideration regarding lung cancer. Patient denies concerns today.

## 2020-06-24 NOTE — Progress Notes (Signed)
Pt received prescribed treatment in clinic, pt stable at d/c. 

## 2020-06-25 LAB — T4: T4, Total: 7.4 ug/dL (ref 4.5–12.0)

## 2020-06-26 DIAGNOSIS — R200 Anesthesia of skin: Secondary | ICD-10-CM | POA: Diagnosis not present

## 2020-06-26 DIAGNOSIS — R202 Paresthesia of skin: Secondary | ICD-10-CM | POA: Diagnosis not present

## 2020-06-26 DIAGNOSIS — G252 Other specified forms of tremor: Secondary | ICD-10-CM | POA: Diagnosis not present

## 2020-06-26 DIAGNOSIS — C3490 Malignant neoplasm of unspecified part of unspecified bronchus or lung: Secondary | ICD-10-CM | POA: Diagnosis not present

## 2020-07-13 ENCOUNTER — Ambulatory Visit: Payer: Medicare Other | Admitting: Gastroenterology

## 2020-07-14 NOTE — Progress Notes (Signed)
[facility]  [address], [address] [place], [place] [address] Phone: [phone]  Fax: [phone]   Clinic Day:  07/15/20  Referring physician: No ref. provider found  Chief Complaint: Scott Jordan is a 70 y.o. male with extensive stage small cell lung cancer who is seen for assessment prior to cycle #9 maintenance Tecentriq.  HPI: The patient was last seen in the medical oncology clinic on 06/24/2020. At that time, he felt "good"  He continued to have left hand pain with limited function. Hematocrit was 40.9, hemoglobin 14.6, platelets 209,000, WBC 4,500 (ANC 2,500).  TSH was 1.410 and T4 was 7.4. Magnesium was 1.6. He was to continue Eliquis. He received 1 gram IV magnesium. He received cycle #8 maintenance Tecentriq.  Head CT without contrast on 06/24/2020 revealed no acute or traumatic findings. There were chronic small-vessel ischemic changes of the cerebral hemispheric white matter. There was old lacunar infarction in the radiating white matter tracts on the left adjacent to the body of the lateral ventricle.  The patient saw Gayland Curry, [place] in neurology on 06/26/2020.  EMG is scheduled.  During the interim, he has felt "pretty good." His breathing is stable. He is trying to eat 3-4 meals per day; weight is up 4 lbs today. He denies nausea, vomiting, and diarrhea. He gets a little bit dizzy when he stands up. His left arm pain is stable.  He denies any drug or alcohol use.  He needs to pick up more vitamin B12 today. He is taking Eliquis.   Past Medical History:  Diagnosis Date  . A-fib ([place]) 01/10/2019   pt st this was dx by Dr. Ubaldo Glassing  . Cancer ([place]) 2021   LUNG  . Complication of anesthesia    DIFFICULTY WAKING UP AFTER SURGERY- 20 YRS AGO  . Hypertension   . Lung cancer ([place])   . Substance abuse [facility])     Past Surgical History:  Procedure Laterality Date  . ARM DEBRIDEMENT Left    INCISION AND DEBRIDEMENT LOWER ARM -20 YRS AGO  . BACK  SURGERY    . PORTACATH PLACEMENT Left 08/29/2019   Procedure: INSERTION PORT-A-CATH;  Surgeon: Nestor Lewandowsky, MD;  Location: [facility] ORS;  Service: General;  Laterality: Left;    History reviewed. No pertinent family history.  Social History:  reports that he has quit smoking. His smoking use included cigarettes. He smoked 0.50 packs per day. He has never used smokeless tobacco. He reports current alcohol use. He reports previous drug use. Drug: Heroin. The patient denies any exposure to radiation or toxins. The patient lives in [place]. He is dependent on public transportation or transportation from friends/family. He has family friends staying with him to help care for his needs.His niece, [name] [phone]), is his medical power of attorney. The patient is alone today.   Allergies: No Known Allergies  Current Medications: Current Outpatient Medications  Medication Sig Dispense Refill  . calcium carbonate (OS-CAL) 600 MG TABS tablet Take 600 mg by mouth daily with breakfast.    . cholecalciferol (VITAMIN D3) 25 MCG (1000 UNIT) tablet Take 1,000 Units by mouth daily.    . Cyanocobalamin (VITAMIN B 12) 500 MCG TABS Take by mouth.    . diltiazem (CARDIZEM) 30 MG tablet Take by mouth.    . diltiazem (TIAZAC) 240 MG 24 hr capsule Take 1 capsule by mouth daily.    Marland Kitchen ELIQUIS 5 MG TABS tablet Take 5 mg by mouth 2 (two) times daily.    Marland Kitchen  feeding supplement, ENSURE ENLIVE, (ENSURE ENLIVE) LIQD Take 237 mLs by mouth 3 (three) times daily between meals. 237 mL 12  . Magnesium 500 MG CAPS Take 500 mg by mouth daily.    . mirtazapine (REMERON) 15 MG tablet Take by mouth.    . potassium chloride SA (KLOR-CON) 20 MEQ tablet TAKE 1 TABLET BY MOUTH DAILY FOR 3 DAYS (Patient taking differently: Take 20 mEq by mouth daily.) 3 tablet 0  . sodium chloride 1 g tablet Take 1 tablet (1 g total) by mouth 3 (three) times daily with meals. 90 tablet 1  . lidocaine-prilocaine (EMLA) cream Apply to affected area  once (Patient not taking: No sig reported) 30 g 3  . naloxone (NARCAN) nasal spray 4 mg/0.1 mL once as needed (Patient not taking: No sig reported)    . nystatin (MYCOSTATIN) 100000 UNIT/ML suspension Use as directed 5 mLs (500,000 Units total) in the mouth or throat 4 (four) times daily. Swish and spit (Patient not taking: No sig reported) 60 mL 0  . ondansetron (ZOFRAN) 8 MG tablet Take 1 tablet (8 mg total) by mouth every 8 (eight) hours as needed for refractory nausea / vomiting. Start on day 3 after carboplatin chemo. (Patient not taking: No sig reported) 30 tablet 1   No current facility-administered medications for this visit.   Facility-Administered Medications Ordered in Other Visits  Medication Dose Route Frequency Provider Last Rate Last Admin  . heparin lock flush 100 unit/mL  500 Units Intravenous Once Ridwan Bondy C, MD      . sodium chloride flush (NS) 0.9 % injection 10 mL  10 mL Intravenous PRN Lequita Asal, MD   10 mL at 11/11/19 6967    Review of Systems  Constitutional: Negative for chills, diaphoresis, fever, malaise/fatigue and weight loss (up 4 lbs).       Feels "pretty good."  HENT: Negative for congestion, ear discharge, ear pain, hearing loss, nosebleeds, sinus pain, sore throat and tinnitus.   Eyes: Negative.  Negative for blurred vision and double vision.  Respiratory: Positive for cough, sputum production (phlegm) and shortness of breath (on exertion). Negative for hemoptysis.   Cardiovascular: Negative.  Negative for chest pain, palpitations and leg swelling.  Gastrointestinal: Negative.  Negative for abdominal pain, blood in stool, constipation, diarrhea, heartburn, melena, nausea and vomiting.  Genitourinary: Negative.  Negative for dysuria, frequency, hematuria and urgency.  Musculoskeletal: Negative.  Negative for back pain, joint pain, myalgias and neck pain.  Skin: Negative for itching and rash.  Neurological: Positive for dizziness (when he  stands up) and sensory change (pain in entire left arm, improved. Right hand numbness.). Negative for tingling, weakness and headaches.  Endo/Heme/Allergies: Negative.   Psychiatric/Behavioral: Negative for depression, memory loss and substance abuse (denies drug and alcohol use). The patient is not nervous/anxious and does not have insomnia.   All other systems reviewed and are negative.  Performance status (ECOG): 1  Vitals Blood pressure 125/64, pulse 90, temperature (!) 97.5 F (36.4 C), temperature source Tympanic, resp. rate 18, weight 130 lb (59 kg), SpO2 99 %.   Physical Exam Vitals and nursing note reviewed.  Constitutional:      General: He is not in acute distress.    Appearance: Normal appearance. He is not ill-appearing, toxic-appearing or diaphoretic.     Interventions: Face mask in place.  HENT:     Head: Normocephalic and atraumatic.     Comments: Graying beard.    Mouth/Throat:  Mouth: Mucous membranes are moist.     Pharynx: Oropharynx is clear. No oropharyngeal exudate or posterior oropharyngeal erythema.  Eyes:     General: No scleral icterus.    Extraocular Movements: Extraocular movements intact.     Conjunctiva/sclera: Conjunctivae normal.     Pupils: Pupils are equal, round, and reactive to light.     Comments: Blue eyes.  Cardiovascular:     Rate and Rhythm: Normal rate and regular rhythm.     Heart sounds: No murmur heard. No gallop.   Pulmonary:     Effort: Pulmonary effort is normal. No respiratory distress.     Breath sounds: No wheezing or rales.  Chest:     Chest wall: No tenderness.  Breasts:     Right: No axillary adenopathy or supraclavicular adenopathy.     Left: No axillary adenopathy or supraclavicular adenopathy.    Abdominal:     General: Bowel sounds are normal. There is no distension.     Palpations: Abdomen is soft. There is no hepatomegaly, splenomegaly or mass.     Tenderness: There is no abdominal tenderness. There is no  guarding or rebound.  Musculoskeletal:        General: No swelling or tenderness. Normal range of motion.     Cervical back: Normal range of motion.     Right lower leg: No edema.     Left lower leg: No edema.  Lymphadenopathy:     Head:     Right side of head: No preauricular, posterior auricular or occipital adenopathy.     Left side of head: No preauricular, posterior auricular or occipital adenopathy.     Cervical: No cervical adenopathy.     Upper Body:     Right upper body: No supraclavicular or axillary adenopathy.     Left upper body: No supraclavicular or axillary adenopathy.     Lower Body: No right inguinal adenopathy. No left inguinal adenopathy.  Skin:    General: Skin is warm and dry.     Findings: No bruising, erythema or rash.  Neurological:     Mental Status: He is alert and oriented to person, place, and time.     Sensory: No sensory deficit.  Psychiatric:        Mood and Affect: Mood normal.        Behavior: Behavior normal.        Thought Content: Thought content normal.        Judgment: Judgment normal.     Imaging studies: 08/19/2019:  Chest CT without contrast revealed the left pleural pigtail drain. There was a small residual left hydropneumothorax. There was a 6.7 x 4 cm medial basilar left lower lobe lung masssuspicious for primary bronchogenic carcinoma. There was a lingular 1.0 cm solid pulmonary nodulesuspicious for ipsilateral metastasis. There was intralobular septal thickening and patchy groundglass opacity throughout the lingula and the left lower lobe with moderate residual atelectasis of the left lung base. A component of lymphangitic carcinomatosis was suspected. There was ipsilateral infra hilar, subcarinal, AP window, prevascular and upper retroperitoneal adenopathyc/wmetastatic disease. 08/21/2019:  Head MRI revealed no evidence of metastatic disease. 08/29/2019:  PET scan revealed markedly hypermetabolic left lower lobe pulmonary lesion  (SUV 7.9) c/w the patient's known malignancy. There was associated hypermetabolic nodal metastases in the mediastinum and left hilum with metastatic involvement of left pleural space. Additional sites included upper abdominal hypermetabolic lymphadenopathy and scattered hypermetabolic bone metastases (left acetabulum, sacrum, and L5). There was interval decrease in left  pleural effusion with areas of loculation. 10/11/2019:  Chest, abdomen, and pelvis CT revealed considerable reduction in size of prior hypermetabolic lymph nodes in the chest and abdomen, compatible with response to therapy. There was notably reduced size of the left pleural effusion although some complexity or loculation persists. The previous areas of hypermetabolic nodularity along the pleural surface were not obviously nodular along the pleural surface. There was considerable reduction in size of the previously consolidated/collapsed left lower lobe, although there was still some residual indistinct opacity in this region. The previous hypermetabolic osseous metastatic lesions were relatively occult.  There was mild airway thickening was present, suggesting bronchitis or reactive airways disease. There was trace pelvic ascites, improved from prior. There was chronic deformity of the left medial clavicle likely due to an old fracture with some deformity of the left sternoclavicular joint. 11/23/2019: Chest CT angiogram revealed no pulmonary embolus.  There was new patchy and ground-glass opacity in the dependent right lower lobe, perifissural right middle and right upper lobe. Findings were suspicious for pneumonia, including aspiration or atypical viral organisms such as COVID-19. There was a small left pleural effusion with slight increase size from prior exam. Volume loss in the left lower lobe with areas of ill-defined consolidation, was not significantly changed from prior. Right hilar adenopathy was new from prior exam, nonspecific in this  setting. Recommend attention at follow-up. There was debris within the trachea and to a lesser extent right mainstem bronchus. There was right lower lobe bronchial thickening with areas of mucous plugging.  11/29/2019:  Abdomen and pelvis CT revealed residual patchy nodular foci of consolidation at the right lung base, overall decreased since 11/22/2019 favoring resolving bronchopneumonia.  Follow-up chest CT was advised.  There was a stable small dependent left pleural effusion and stable top normal size previously hypermetabolic left periaortic lymph node.  There was stable faint sclerotic left pelvic bone lesions.  There were no new or progressive metastatic disease in the abdomen or pelvis. 03/09/2020:  CXR showed left base atelectasis with small left pleural effusion, resent on prior CT examination. There was no new opacity evident.  There was a stable cardiac silhouette. There was no adenopathy evident. Port-A-Cath tip was at the cavoatrial junction. 03/10/2020:  Head MRI with and without contrast revealed no evidence of intracranial metastatic disease. There were remote small infarcts in the left corona radiata and left thalamus, unchanged. There was stable moderate white matter disease, likely representing chronic small vessel ischemic disease. There were bilateral mastoid effusions. 03/10/2020:  Cervical spine MRI with and without contrast revealed no evidence of metastatic disease to the cervical spine. There were multilevel degenerative changes of the cervical spine, worst at C5-C6 where there was moderate spinal canal stenosis and severe bilateral neural foraminal narrowing. There was severe right neural foraminal narrowing at C4-5. There was moderate to severe left neural foraminal narrowing at C3-4. 03/19/2020:  PET scan revealed mild patchy left lower lobe opacity, stable versus mildly improved, without appreciable hypermetabolism on PET. While a small focus of residual tumor was difficult to  entirely exclude, for the most part this favored treated disease.  There were no findings suspicious for metastatic disease. 06/23/2020:  Chest, abdomen, and pelvis CT revealed continued volume loss and confluent bandlike airspace opacity at the left lung base in the lower lobes suggesting prior regional therapy and/or scarring.  There was a new 0.6 x 0.4 cm pleural-based nodule in the left upper lobe adjacent to the lingula, probably benign/inflammatory but merits surveillance.  The previous metastatic lesions to the left iliac bone and ischium are no longer well seen on CT. No new metastatic lesion were identified.  Other imaging findings of potential clinical significance: coronary atherosclerosis, borderline cardiomegaly, contrast medium in the distal esophagus possibly from dysmotility or reflux, cholelithiasis, right mid kidney cyst, and emphysema and aortic atherosclerosis. 06/24/2020:  Head CT without contrast revealed no acute or traumatic findings. There were chronic small-vessel ischemic changes of the cerebral hemispheric white matter. There was old lacunar infarction in the radiating white matter tracts on the left adjacent to the body of the lateral ventricle.   Infusion on 07/15/2020  Component Date Value Ref Range Status  . Magnesium 07/15/2020 1.8  1.7 - 2.4 mg/dL Final   Performed at [facility], [address], [place], [place] [address]  . Sodium 07/15/2020 135  135 - 145 mmol/L Final  . Potassium 07/15/2020 3.5  3.5 - 5.1 mmol/L Final  . Chloride 07/15/2020 102  98 - 111 mmol/L Final  . CO2 07/15/2020 24  22 - 32 mmol/L Final  . Glucose, Bld 07/15/2020 107* 70 - 99 mg/dL Final   Glucose reference range applies only to samples taken after fasting for at least 8 hours.  . BUN 07/15/2020 9  8 - 23 mg/dL Final  . Creatinine, Ser 07/15/2020 0.57* 0.61 - 1.24 mg/dL Final  . Calcium 07/15/2020 9.3  8.9 - 10.3 mg/dL Final  . Total Protein 07/15/2020 7.9  6.5 - 8.1 g/dL  Final  . Albumin 07/15/2020 4.1  3.5 - 5.0 g/dL Final  . AST 07/15/2020 19  15 - 41 U/L Final  . ALT 07/15/2020 12  0 - 44 U/L Final  . Alkaline Phosphatase 07/15/2020 66  38 - 126 U/L Final  . Total Bilirubin 07/15/2020 0.5  0.3 - 1.2 mg/dL Final  . GFR, Estimated 07/15/2020 >60  >60 mL/min Final   Comment: (NOTE) Calculated using the CKD-EPI Creatinine Equation (2021)   . Anion gap 07/15/2020 9  5 - 15 Final   Performed at [facility], [address] [place], [place] [address]  . WBC 07/15/2020 5.3  4.0 - 10.5 K/uL Final  . RBC 07/15/2020 4.17* 4.22 - 5.81 MIL/uL Final  . Hemoglobin 07/15/2020 14.4  13.0 - 17.0 g/dL Final  . HCT 07/15/2020 40.2  39.0 - 52.0 % Final  . MCV 07/15/2020 96.4  80.0 - 100.0 fL Final  . MCH 07/15/2020 34.5* 26.0 - 34.0 pg Final  . MCHC 07/15/2020 35.8  30.0 - 36.0 g/dL Final  . RDW 07/15/2020 12.3  11.5 - 15.5 % Final  . Platelets 07/15/2020 226  150 - 400 K/uL Final  . nRBC 07/15/2020 0.0  0.0 - 0.2 % Final  . Neutrophils Relative % 07/15/2020 65  % Final  . Neutro Abs 07/15/2020 3.5  1.7 - 7.7 K/uL Final  . Lymphocytes Relative 07/15/2020 24  % Final  . Lymphs Abs 07/15/2020 1.3  0.7 - 4.0 K/uL Final  . Monocytes Relative 07/15/2020 10  % Final  . Monocytes Absolute 07/15/2020 0.5  0.1 - 1.0 K/uL Final  . Eosinophils Relative 07/15/2020 0  % Final  . Eosinophils Absolute 07/15/2020 0.0  0.0 - 0.5 K/uL Final  . Basophils Relative 07/15/2020 0  % Final  . Basophils Absolute 07/15/2020 0.0  0.0 - 0.1 K/uL Final  . Immature Granulocytes 07/15/2020 1  % Final  . Abs Immature Granulocytes 07/15/2020 0.04  0.00 - 0.07 K/uL Final  Performed at [facility], [address] [place], [place] [address]    Assessment:  ERICSON NAFZIGER is a 70 y.o. male with extensive stage small cell lung cancer. He has a >40 pack year smoking. He presented with a recurrent left sided pleural effusion. He has undergone thoracentesis x 2 in the past  month. He is s/p pigtail drain. Initial cytology x 2 was negative (lymphocytic exudative effusion).Pleural fluid cytologyon 08/18/2019 confirmed small cell lung cancer.  Chest CT without contraston 08/19/2019 revealed the left pleural pigtail drain. There was a small residual left hydropneumothorax. There was a 6.7 x 4 cm medial basilar left lower lobe lung masssuspicious for primary bronchogenic carcinoma. There was a lingular 1.0 cm solid pulmonary nodulesuspicious for ipsilateral metastasis. There was intralobular septal thickening and patchy groundglass opacity throughout the lingula and the left lower lobe with moderate residual atelectasis of the left lung base. A component of lymphangitic carcinomatosis was suspected. There was ipsilateral infra hilar, subcarinal, AP window, prevascular and upper retroperitoneal adenopathyc/wmetastatic disease.  Head MRI on 08/21/2019 revealed no evidence of metastatic disease.  PET scan on 05/20/20201 revealed markedly hypermetabolic left lower lobe pulmonary lesion (SUV 7.9) c/w the patient's known malignancy. There was associated hypermetabolic nodal metastases in the mediastinum and left hilum with metastatic involvement of left pleural space. Additional sites included upper abdominal hypermetabolic lymphadenopathy and scattered hypermetabolic bone metastases (left acetabulum, sacrum, and L5). There was interval decrease in left pleural effusion with areas of loculation.  CEA was 3.7 on 09/02/2019, 3.8 on 02/17/2020 and 3.1 on 05/13/2020.  LDH was 206 on 09/23/2019, 144 on 10/16/2019 and 102 on 05/13/2020.  He is s/p 4 cycles of carboplatin, etoposide, and Tecentriq (09/02/2019- 11/11/2019) with Margarette Canada support.  He is s/p 8 cycles of maintenance Tecentriq (12/02/2019 - 02/17/2020; 04/22/2020 - 06/24/2020).  He received PCI from 01/09/2020 - 01/22/2020.   Head MRI on 03/10/2020 revealed no evidence of intracranial metastatic disease. There  were remote small infarcts in the left corona radiata and left thalamus, unchanged. There was stable moderate white matter disease, likely representing chronic small vessel ischemic disease. There were bilateral mastoid effusions.  Cervical spine MRI on 03/10/2020 revealed no evidence of metastatic disease to the cervical spine. There were multilevel degenerative changes of the cervical spine, worst at C5-C6 where there was moderate spinal canal stenosis and severe bilateral neural foraminal narrowing. There was severe right neural foraminal narrowing at C4-5. There was moderate to severe left neural foraminal narrowing at C3-4. Head CT without contrast on 06/24/2020 revealed no acute or traumatic findings. There were chronic small-vessel ischemic changes of the cerebral hemispheric white matter. There was old lacunar infarction in the radiating white matter tracts on the left adjacent to the body of the lateral ventricle.  Chest, abdomen, and pelvis CT on 06/23/2020 revealed continued volume loss and confluent bandlike airspace opacity at the left lung base in the lower lobes suggesting prior regional therapy and/or scarring. There was a new 0.6 x 0.4 cm pleural-based nodule in the left upper lobe adjacent to the lingula, probably benign/inflammatory but merits surveillance.  The previous metastatic lesions to the left iliac bone and ischium are no longer well seen on CT. No new metastatic lesion were identified.    He was admitted to [facility] from 11/22/2019 - 11/25/2019 with a heroin overdose and aspiration pneumonia.  He received 1 unit of PRBCs.  He was treated with Zosyn.  He was discharged on Augmentin x5 days.  He has  hyponatremiasecondary to SIADH.  He began sodium chloride tablets (1 gm TID) on 09/04/2019.  He has B12 deficiency.  B12 was 227 on 03/23/2020 and 646 on 05/13/2020.   He is on oral B12.  Folate was 19.2 on 04/22/2020.  He has left upper extremity symptoms c/w a left-sided C6  radiculopathy as well as ulnar neuropathy.  He has elevated LFTs. Hepatitis C antibody and hepatitis B core antibody total were positive on 12/23/2019.  Hepatitis B surface antigen, hepatitis B core IgM and hepatitis A IgM were negative.  Hepatitis B DNA was not detected. Hepatitis C RNA quantitative revealed 3.869 log 10 IU/mL (7400 IU/mL).  Hepatitis C genotype is 2b.  He has a significant family history of malignancy. A family member has had genetic testing (negative for Lynch syndrome).  Symptomatically, he feels "pretty good."  He denies any respiratory concerns. He is trying to eat 3-4 meals per day; weight is up 4 pounds.  Exam is stable.   Plan: 1.   Labs today: CBC with diff, CMP, Mg. 2. Metastatic small cell lung cancer Clinically, he is doing well.   Exam is stable. He is s/p 4 cycles of carboplatin, etoposide and Tecentriq (last 11/11/2019). Chest CT angiogram plus abdomen and pelvis CT in 08/2021revealed adramatic response to treatment. He completed PCI on 01/22/2020. He iss/p 8cycles of maintenance Tecentriq (last 06/24/2020). Cycle #2 maintenance Tecentriq was held secondary to increased LFTs on 12/30/2019. No evidence for Tecentriq induced hepatitis. Etiology felt secondary to possible alcohol or less likely hepatitis C.  Tecentriq was held secondary to unexplained neuropathy in his left upper extremity. PET scan on 03/19/2020 revealed no clear evidence of recurrent disease.  Chest, abdomen, and pelvis CT on 06/23/2020 revealed a new 0.6 x 0.4 cm pleural-based nodule in the left upper lobe adjacent to the lingula.   Etiology felt benign/inflammatory.     Anticipate f/u chest CT in 3 months.             Labs reviewed.  Cycle #8 Tecentriq today.    Discuss symptom management.  He has antiemeticsat home to use on a prn bases.   Interventions are adequate.    3. History of atrial fibrillation/flutter He denies any bruising or bleeding.  Continue Eliquis. 4. Left C6 radiculopathy and ulnar neuropathy Left-sided symptoms appear related to possible nerve entrapment at the elbow.             Head MRI on 03/10/2020 revealed no evidence of intracranial metastatic disease.              Cervical spine MRI with and without contrast on 03/10/2020 confirmed no metastatic disease.                         There were multilevel degenerative changes, worst at C5-C6, with moderate spinal canal stenosis and severe bilateral neuroforaminal narrowing.                         There was severe right neural foraminal narrowing at C4-5.                          There was moderate to severe left neural foraminal narrowing at C3-4.             He is followed by Dr Manuella Ghazi and Dr Izora Ribas.  Review interval note on 06/26/2020 by Gayland Curry, [place] in neurology.  EMG is scheduled. 5.B12 deficiency B12 was 227 on 03/23/2020 and 646 on 05/13/2020.  Continue oral B12.  Check B12 and folate annually. 6.   Cycle #9 maintenance Tecentriq today. 7.   RN:  Please provide patient with Ensure/Boost. 8.   RTC in 3 weeks for MD assessment, labs (CBC with diff, CMP, Mg, TSH), and cycle #10 maintenance Tecentriq.  I discussed the assessment and treatment plan with the patient.  The patient was provided an opportunity to ask questions and all were answered.  The patient agreed with the plan and demonstrated an understanding of the instructions.  The patient was advised to call back if the symptoms worsen or if the condition fails to improve as anticipated.   Lequita Asal, MD, PhD    07/15/2020, 10:29 AM  I, Mirian Mo Tufford, am acting as Education administrator for [employer]. Mike Gip, MD, PhD.  I, Arlander Gillen C. Mike Gip, MD, have reviewed the above documentation for accuracy and completeness, and I agree with the above.

## 2020-07-15 ENCOUNTER — Encounter: Payer: Self-pay | Admitting: Hematology and Oncology

## 2020-07-15 ENCOUNTER — Inpatient Hospital Stay: Payer: Medicare Other | Attending: Hematology and Oncology

## 2020-07-15 ENCOUNTER — Inpatient Hospital Stay (HOSPITAL_BASED_OUTPATIENT_CLINIC_OR_DEPARTMENT_OTHER): Payer: Medicare Other | Admitting: Hematology and Oncology

## 2020-07-15 ENCOUNTER — Inpatient Hospital Stay: Payer: Medicare Other

## 2020-07-15 ENCOUNTER — Other Ambulatory Visit: Payer: Self-pay

## 2020-07-15 VITALS — BP 125/64 | HR 90 | Temp 97.5°F | Resp 18 | Wt 130.0 lb

## 2020-07-15 DIAGNOSIS — R7989 Other specified abnormal findings of blood chemistry: Secondary | ICD-10-CM | POA: Insufficient documentation

## 2020-07-15 DIAGNOSIS — I4891 Unspecified atrial fibrillation: Secondary | ICD-10-CM | POA: Diagnosis not present

## 2020-07-15 DIAGNOSIS — Z5112 Encounter for antineoplastic immunotherapy: Secondary | ICD-10-CM

## 2020-07-15 DIAGNOSIS — E222 Syndrome of inappropriate secretion of antidiuretic hormone: Secondary | ICD-10-CM | POA: Diagnosis not present

## 2020-07-15 DIAGNOSIS — G5622 Lesion of ulnar nerve, left upper limb: Secondary | ICD-10-CM | POA: Diagnosis not present

## 2020-07-15 DIAGNOSIS — I10 Essential (primary) hypertension: Secondary | ICD-10-CM | POA: Diagnosis not present

## 2020-07-15 DIAGNOSIS — Z87891 Personal history of nicotine dependence: Secondary | ICD-10-CM | POA: Diagnosis not present

## 2020-07-15 DIAGNOSIS — C3490 Malignant neoplasm of unspecified part of unspecified bronchus or lung: Secondary | ICD-10-CM | POA: Diagnosis not present

## 2020-07-15 DIAGNOSIS — M5412 Radiculopathy, cervical region: Secondary | ICD-10-CM

## 2020-07-15 DIAGNOSIS — E538 Deficiency of other specified B group vitamins: Secondary | ICD-10-CM | POA: Insufficient documentation

## 2020-07-15 DIAGNOSIS — Z7901 Long term (current) use of anticoagulants: Secondary | ICD-10-CM | POA: Diagnosis not present

## 2020-07-15 DIAGNOSIS — E871 Hypo-osmolality and hyponatremia: Secondary | ICD-10-CM

## 2020-07-15 DIAGNOSIS — C3412 Malignant neoplasm of upper lobe, left bronchus or lung: Secondary | ICD-10-CM | POA: Diagnosis not present

## 2020-07-15 DIAGNOSIS — Z79899 Other long term (current) drug therapy: Secondary | ICD-10-CM | POA: Insufficient documentation

## 2020-07-15 LAB — COMPREHENSIVE METABOLIC PANEL
ALT: 12 U/L (ref 0–44)
AST: 19 U/L (ref 15–41)
Albumin: 4.1 g/dL (ref 3.5–5.0)
Alkaline Phosphatase: 66 U/L (ref 38–126)
Anion gap: 9 (ref 5–15)
BUN: 9 mg/dL (ref 8–23)
CO2: 24 mmol/L (ref 22–32)
Calcium: 9.3 mg/dL (ref 8.9–10.3)
Chloride: 102 mmol/L (ref 98–111)
Creatinine, Ser: 0.57 mg/dL — ABNORMAL LOW (ref 0.61–1.24)
GFR, Estimated: >60 mL/min (ref >60)
Glucose, Bld: 107 mg/dL — ABNORMAL HIGH (ref 70–99)
Potassium: 3.5 mmol/L (ref 3.5–5.1)
Sodium: 135 mmol/L (ref 135–145)
Total Bilirubin: 0.5 mg/dL (ref 0.3–1.2)
Total Protein: 7.9 g/dL (ref 6.5–8.1)

## 2020-07-15 LAB — CBC WITH DIFFERENTIAL/PLATELET
Abs Immature Granulocytes: 0.04 10*3/uL (ref 0.00–0.07)
Basophils Absolute: 0 10*3/uL (ref 0.0–0.1)
Basophils Relative: 0 %
Eosinophils Absolute: 0 10*3/uL (ref 0.0–0.5)
Eosinophils Relative: 0 %
HCT: 40.2 % (ref 39.0–52.0)
Hemoglobin: 14.4 g/dL (ref 13.0–17.0)
Immature Granulocytes: 1 %
Lymphocytes Relative: 24 %
Lymphs Abs: 1.3 10*3/uL (ref 0.7–4.0)
MCH: 34.5 pg — ABNORMAL HIGH (ref 26.0–34.0)
MCHC: 35.8 g/dL (ref 30.0–36.0)
MCV: 96.4 fL (ref 80.0–100.0)
Monocytes Absolute: 0.5 10*3/uL (ref 0.1–1.0)
Monocytes Relative: 10 %
Neutro Abs: 3.5 10*3/uL (ref 1.7–7.7)
Neutrophils Relative %: 65 %
Platelets: 226 10*3/uL (ref 150–400)
RBC: 4.17 MIL/uL — ABNORMAL LOW (ref 4.22–5.81)
RDW: 12.3 % (ref 11.5–15.5)
WBC: 5.3 10*3/uL (ref 4.0–10.5)
nRBC: 0 % (ref 0.0–0.2)

## 2020-07-15 LAB — MAGNESIUM: Magnesium: 1.8 mg/dL (ref 1.7–2.4)

## 2020-07-15 LAB — TSH: TSH: 2.558 u[IU]/mL (ref 0.350–4.500)

## 2020-07-15 MED ORDER — HEPARIN SOD (PORK) LOCK FLUSH 100 UNIT/ML IV SOLN
INTRAVENOUS | Status: AC
  Filled 2020-07-15: qty 5

## 2020-07-15 MED ORDER — HEPARIN SOD (PORK) LOCK FLUSH 100 UNIT/ML IV SOLN
500.0000 [IU] | Freq: Once | INTRAVENOUS | Status: AC | PRN
  Administered 2020-07-15: 500 [IU]
  Filled 2020-07-15: qty 5

## 2020-07-15 MED ORDER — SODIUM CHLORIDE 0.9 % IV SOLN
Freq: Once | INTRAVENOUS | Status: AC
  Filled 2020-07-15: qty 250

## 2020-07-15 MED ORDER — SODIUM CHLORIDE 0.9 % IV SOLN
1200.0000 mg | Freq: Once | INTRAVENOUS | Status: AC
  Administered 2020-07-15: 1200 mg via INTRAVENOUS
  Filled 2020-07-15: qty 20

## 2020-07-16 LAB — T4: T4, Total: 7.9 ug/dL (ref 4.5–12.0)

## 2020-07-29 ENCOUNTER — Ambulatory Visit
Admission: RE | Admit: 2020-07-29 | Discharge: 2020-07-29 | Disposition: A | Discharge status: Home / Self Care | Payer: Medicare Other | Source: Ambulatory Visit | Attending: Radiation Oncology | Admitting: Radiation Oncology

## 2020-07-29 ENCOUNTER — Inpatient Hospital Stay: Payer: Medicare Other

## 2020-07-29 ENCOUNTER — Other Ambulatory Visit: Payer: Self-pay

## 2020-07-29 ENCOUNTER — Encounter: Payer: Self-pay | Admitting: Radiation Oncology

## 2020-07-29 VITALS — BP 125/95 | HR 129 | Temp 96.2°F | Wt 136.1 lb

## 2020-07-29 DIAGNOSIS — Z08 Encounter for follow-up examination after completed treatment for malignant neoplasm: Secondary | ICD-10-CM | POA: Diagnosis not present

## 2020-07-29 DIAGNOSIS — C3432 Malignant neoplasm of lower lobe, left bronchus or lung: Secondary | ICD-10-CM | POA: Insufficient documentation

## 2020-07-29 DIAGNOSIS — Z298 Encounter for other specified prophylactic measures: Secondary | ICD-10-CM | POA: Insufficient documentation

## 2020-07-29 DIAGNOSIS — Z9221 Personal history of antineoplastic chemotherapy: Secondary | ICD-10-CM | POA: Diagnosis not present

## 2020-07-29 DIAGNOSIS — C3490 Malignant neoplasm of unspecified part of unspecified bronchus or lung: Secondary | ICD-10-CM

## 2020-07-29 DIAGNOSIS — Z923 Personal history of irradiation: Secondary | ICD-10-CM | POA: Insufficient documentation

## 2020-07-29 NOTE — Progress Notes (Signed)
Radiation Oncology Follow up Note  Name: Scott Jordan   Date:   07/29/2020 MRN:  [id number] DOB: Oct 30, 1950    This 70 y.o. male presents to the clinic today for 57-month follow-up status post PCI for extensive stage small cell lung cancer.  REFERRING PROVIDER: No ref. provider found  HPI: Patient is a 70 year old male now out 6 months having completed PCI for extensive stage small cell lung cancer.  Patient currently is on Tecentriq tolerating that well.Marland Kitchen  He is otherwise doing well no change in neurologic status no complaints as far as cognitive function.  He had a CT scan of his head showing no acute findings.  Recent CT scan showed volume loss and confluent bandlike opacity at the left lung base consistent with radiation changes.  Also has a 0.6 cm pleural-based nodule left upper lobe adjacent to the lingular probably inflammatory.  He specifically [place] cough hemoptysis or chest tightness.  COMPLICATIONS OF TREATMENT: none  FOLLOW UP COMPLIANCE: keeps appointments   PHYSICAL EXAM:  BP (!) 125/95   Pulse (!) 129   Temp (!) 96.2 F (35.7 C) (Tympanic)   Wt 136 lb 1.6 oz (61.7 kg)   BMI 17.47 kg/m  Well-developed well-nourished patient in NAD. HEENT reveals PERLA, EOMI, discs not visualized.  Oral cavity is clear. No oral mucosal lesions are identified. Neck is clear without evidence of cervical or supraclavicular adenopathy. Lungs are clear to A&P. Cardiac examination is essentially unremarkable with regular rate and rhythm without murmur rub or thrill. Abdomen is benign with no organomegaly or masses noted. Motor sensory and DTR levels are equal and symmetric in the upper and lower extremities. Cranial nerves II through XII are grossly intact. Proprioception is intact. No peripheral adenopathy or edema is identified. No motor or sensory levels are noted. Crude visual fields are within normal range.  RADIOLOGY RESULTS: Head CT and chest CT scans reviewed  PLAN: Present time patient is  doing well currently on Tecentriq chemotherapy under Dr. Kem Parkinson direction.  And pleased with his overall progress.  Asked to see him back in 6 months for follow-up patient knows to call with any concerns.  I would like to take this opportunity to thank you for allowing me to participate in the care of your patient.Noreene Filbert, MD

## 2020-07-31 ENCOUNTER — Telehealth: Payer: Self-pay | Admitting: *Deleted

## 2020-07-31 NOTE — Telephone Encounter (Signed)
Contacted patient via telephone to inform him that Dr. Mike Gip is no longer with the [facility]. Advised patient that he could keep his scheduled appointment in [place] and see the NP, or he can reschedule his appointment and see an MD in [place]. Patient stated that he would like to stay in [facility] and he is fine with seeing the NP.

## 2020-08-03 ENCOUNTER — Encounter: Payer: Self-pay | Admitting: Gastroenterology

## 2020-08-03 ENCOUNTER — Other Ambulatory Visit: Payer: Self-pay

## 2020-08-03 ENCOUNTER — Inpatient Hospital Stay: Admit: 2020-08-03 | Payer: Medicare Other

## 2020-08-03 ENCOUNTER — Ambulatory Visit (INDEPENDENT_AMBULATORY_CARE_PROVIDER_SITE_OTHER): Payer: Medicare Other | Admitting: Gastroenterology

## 2020-08-03 ENCOUNTER — Other Ambulatory Visit
Admission: RE | Admit: 2020-08-03 | Discharge: 2020-08-03 | Disposition: A | Discharge status: Home / Self Care | Payer: Medicare Other | Attending: Gastroenterology | Admitting: Gastroenterology

## 2020-08-03 VITALS — Temp 97.0°F | Ht 74.0 in | Wt 134.4 lb

## 2020-08-03 DIAGNOSIS — B182 Chronic viral hepatitis C: Secondary | ICD-10-CM | POA: Insufficient documentation

## 2020-08-03 NOTE — Progress Notes (Signed)
Gastroenterology Consultation  Referring Provider:     Lequita Asal, MD Primary Care Physician:  Patient, No Pcp Per (Inactive) Primary Gastroenterologist:  Dr. Allen Norris     Reason for Consultation:     Hepatitis C        HPI:   JANCARLO BIERMANN is a 70 y.o. y/o male referred for consultation & management of hepatitis C positive by Dr. Patient, No Pcp Per (Inactive).  This patient comes in today with a history of lung cancer and treatment for lung cancer.  The patient had a bump in his LFTs and it was thought that it may be due to his medication but the patient has been found to have hepatitis C. The patient has stated that he was born with jaundice that resolved.  He also reports that he quit IV drugs 3 years ago but used for more than 20 years.  The patient denies ever being treated for hepatitis C.  Although his referral was put in for hepatitis B the patient's DNA was negative and his surface antigen was negative but he did have a core antibody positive.  The patient denies any abdominal pain nausea vomiting fevers or chills.  The patient is actively being treated for his lung cancer with mets.  He has been sent off by his oncologist for a viral load and has also been found to have genotype 2b.  The patient's HCVRNA was 3.869 log copies per milliliter with a level of 7400 IUs/mL.  The patient's liver enzymes were elevated back in September of last year but have come back to normal.  Past Medical History:  Diagnosis Date  . A-fib ([place]) 01/10/2019   pt st this was dx by Dr. Ubaldo Glassing  . Cancer ([place]) 2021   LUNG  . Complication of anesthesia    DIFFICULTY WAKING UP AFTER SURGERY- 20 YRS AGO  . Hypertension   . Lung cancer ([place])   . Substance abuse [facility])     Past Surgical History:  Procedure Laterality Date  . ARM DEBRIDEMENT Left    INCISION AND DEBRIDEMENT LOWER ARM -20 YRS AGO  . BACK SURGERY    . PORTACATH PLACEMENT Left 08/29/2019   Procedure: INSERTION PORT-A-CATH;  Surgeon: Nestor Lewandowsky, MD;  Location: [facility] ORS;  Service: General;  Laterality: Left;    Prior to Admission medications   Medication Sig Start Date End Date Taking? Authorizing Provider  calcium carbonate (OS-CAL) 600 MG TABS tablet Take 600 mg by mouth daily with breakfast.    [provider]  cholecalciferol (VITAMIN D3) 25 MCG (1000 UNIT) tablet Take 1,000 Units by mouth daily.    [provider]  Cyanocobalamin (VITAMIN B 12) 500 MCG TABS Take by mouth.    [provider]  diltiazem (CARDIZEM) 30 MG tablet Take by mouth. 06/20/20   [provider]  diltiazem (TIAZAC) 240 MG 24 hr capsule Take 1 capsule by mouth daily. 07/24/19   [provider]  ELIQUIS 5 MG TABS tablet Take 5 mg by mouth 2 (two) times daily. 08/30/19   [provider]  feeding supplement, ENSURE ENLIVE, (ENSURE ENLIVE) LIQD Take 237 mLs by mouth 3 (three) times daily between meals. 08/23/19   Enzo Bi, MD  lidocaine-prilocaine (EMLA) cream Apply to affected area once 08/26/19   Lequita Asal, MD  Magnesium 500 MG CAPS Take 500 mg by mouth daily.    [provider]  mirtazapine (REMERON) 15 MG tablet Take by mouth. 06/26/20  06/26/21  [provider]  naloxone [facility]) nasal spray 4 mg/0.1 mL once as needed 01/13/19   [provider]  nystatin (MYCOSTATIN) 100000 UNIT/ML suspension Use as directed 5 mLs (500,000 Units total) in the mouth or throat 4 (four) times daily. Swish and spit 09/02/19   Lequita Asal, MD  ondansetron (ZOFRAN) 8 MG tablet Take 1 tablet (8 mg total) by mouth every 8 (eight) hours as needed for refractory nausea / vomiting. Start on day 3 after carboplatin chemo. 08/26/19   Lequita Asal, MD  potassium chloride SA (KLOR-CON) 20 MEQ tablet TAKE 1 TABLET BY MOUTH DAILY FOR 3 DAYS Patient taking differently: Take 20 mEq by mouth daily. 12/03/19   Lequita Asal, MD  sodium chloride 1 g tablet Take 1 tablet (1 g total) by mouth 3  (three) times daily with meals. 06/24/20   Lequita Asal, MD    No family history on file.   Social History   Tobacco Use  . Smoking status: Former Smoker    Packs/day: 0.50    Types: Cigarettes  . Smokeless tobacco: Never Used  . Tobacco comment: not smoked since 6 weeks ago  Vaping Use  . Vaping Use: Never used  Substance Use Topics  . Alcohol use: Yes    Comment: 84 OZ IN WEEK  . Drug use: Not Currently    Types: Heroin    Comment: heroin WITHIN PAST YEAR    Allergies as of 08/03/2020  . (No Known Allergies)    Review of Systems:    All systems reviewed and negative except where noted in HPI.   Physical Exam:  There were no vitals taken for this visit. No LMP for male patient. General:   Alert,  Well-developed, well-nourished, pleasant and cooperative in NAD Head:  Normocephalic and atraumatic. Eyes:  Sclera clear, no icterus.   Conjunctiva pink. Ears:  Normal auditory acuity. Neck:  Supple; no masses or thyromegaly. Lungs:  Respirations even and unlabored.  Clear throughout to auscultation.   No wheezes, crackles, or rhonchi. No acute distress. Heart:  Regular rate and rhythm; no murmurs, clicks, rubs, or gallops. Abdomen:  Normal bowel sounds.  No bruits.  Soft, non-tender and non-distended without masses, hepatosplenomegaly or hernias noted.  No guarding or rebound tenderness.  Negative Carnett sign.   Rectal:  Deferred.  Pulses:  Normal pulses noted. Extremities:  No clubbing or edema.  No cyanosis. Neurologic:  Alert and oriented x3;  grossly normal neurologically. Skin:  Intact without significant lesions or rashes.  No jaundice. Lymph Nodes:  No significant cervical adenopathy. Psych:  Alert and cooperative. Normal mood and affect.  Imaging Studies: No results found.  Assessment and Plan:   JENARO SOUDER is a 70 y.o. y/o male who comes in here for a referral forr hepatitis B although it appears that he had hepatitis B that has resolved.  The  patient's main issue now is his lung cancer and being found to have hepatitis C.  The patient is still receiving chemotherapy for his lung cancer and his liver enzymes are now normal.  The patient will be set up for evaluation for the amount of fibrosis he may have and has been told that he will need to be started on treatment after he has taken care of his treatment with his lung cancer.  The patient has also been told that since he has not had a colonoscopy in 10 years he should have a repeat colonoscopy again  to be considered after he is cleared from a lung cancer point of view and if his prognosis is good.  The patient has been explained the plan and agrees with it.    Lucilla Lame, MD. Marval Regal    Note: This dictation was prepared with Dragon dictation along with smaller phrase technology. Any transcriptional errors that result from this process are unintentional.

## 2020-08-04 ENCOUNTER — Other Ambulatory Visit: Payer: Self-pay

## 2020-08-04 DIAGNOSIS — C3490 Malignant neoplasm of unspecified part of unspecified bronchus or lung: Secondary | ICD-10-CM

## 2020-08-04 DIAGNOSIS — R200 Anesthesia of skin: Secondary | ICD-10-CM | POA: Diagnosis not present

## 2020-08-04 LAB — HCV FIBROSURE
ALPHA 2-MACROGLOBULINS, QN: 325 mg/dL — ABNORMAL HIGH (ref 110–276)
ALT (SGPT) P5P: 12 IU/L (ref 0–55)
Apolipoprotein A-1: 124 mg/dL (ref 101–178)
Bilirubin, Total: 0.5 mg/dL (ref 0.0–1.2)
Fibrosis Score: 0.64 — ABNORMAL HIGH (ref 0.00–0.21)
GGT: 25 IU/L (ref 0–65)
Haptoglobin: 95 mg/dL (ref 32–363)
Necroinflammat Activity Score: 0.06 (ref 0.00–0.17)

## 2020-08-05 ENCOUNTER — Other Ambulatory Visit: Payer: Self-pay

## 2020-08-05 ENCOUNTER — Inpatient Hospital Stay: Payer: Medicare Other

## 2020-08-05 ENCOUNTER — Inpatient Hospital Stay (HOSPITAL_BASED_OUTPATIENT_CLINIC_OR_DEPARTMENT_OTHER): Payer: Medicare Other | Admitting: Oncology

## 2020-08-05 ENCOUNTER — Encounter: Payer: Self-pay | Admitting: Oncology

## 2020-08-05 VITALS — BP 156/117 | HR 75 | Temp 97.1°F | Resp 16 | Wt 130.8 lb

## 2020-08-05 DIAGNOSIS — Z87891 Personal history of nicotine dependence: Secondary | ICD-10-CM | POA: Diagnosis not present

## 2020-08-05 DIAGNOSIS — I10 Essential (primary) hypertension: Secondary | ICD-10-CM | POA: Diagnosis not present

## 2020-08-05 DIAGNOSIS — C3490 Malignant neoplasm of unspecified part of unspecified bronchus or lung: Secondary | ICD-10-CM

## 2020-08-05 DIAGNOSIS — Z5112 Encounter for antineoplastic immunotherapy: Secondary | ICD-10-CM | POA: Diagnosis not present

## 2020-08-05 DIAGNOSIS — C3412 Malignant neoplasm of upper lobe, left bronchus or lung: Secondary | ICD-10-CM | POA: Diagnosis not present

## 2020-08-05 DIAGNOSIS — R7989 Other specified abnormal findings of blood chemistry: Secondary | ICD-10-CM | POA: Diagnosis not present

## 2020-08-05 DIAGNOSIS — E222 Syndrome of inappropriate secretion of antidiuretic hormone: Secondary | ICD-10-CM | POA: Diagnosis not present

## 2020-08-05 DIAGNOSIS — Z79899 Other long term (current) drug therapy: Secondary | ICD-10-CM | POA: Diagnosis not present

## 2020-08-05 DIAGNOSIS — E538 Deficiency of other specified B group vitamins: Secondary | ICD-10-CM | POA: Diagnosis not present

## 2020-08-05 DIAGNOSIS — Z7901 Long term (current) use of anticoagulants: Secondary | ICD-10-CM | POA: Diagnosis not present

## 2020-08-05 DIAGNOSIS — I4891 Unspecified atrial fibrillation: Secondary | ICD-10-CM | POA: Diagnosis not present

## 2020-08-05 LAB — TSH: TSH: 2.062 u[IU]/mL (ref 0.350–4.500)

## 2020-08-05 LAB — COMPREHENSIVE METABOLIC PANEL
ALT: 13 U/L (ref 0–44)
AST: 21 U/L (ref 15–41)
Albumin: 3.8 g/dL (ref 3.5–5.0)
Alkaline Phosphatase: 62 U/L (ref 38–126)
Anion gap: 7 (ref 5–15)
BUN: 19 mg/dL (ref 8–23)
CO2: 26 mmol/L (ref 22–32)
Calcium: 9.3 mg/dL (ref 8.9–10.3)
Chloride: 104 mmol/L (ref 98–111)
Creatinine, Ser: 0.86 mg/dL (ref 0.61–1.24)
GFR, Estimated: >60 mL/min (ref >60)
Glucose, Bld: 107 mg/dL — ABNORMAL HIGH (ref 70–99)
Potassium: 3.7 mmol/L (ref 3.5–5.1)
Sodium: 137 mmol/L (ref 135–145)
Total Bilirubin: 0.5 mg/dL (ref 0.3–1.2)
Total Protein: 7.1 g/dL (ref 6.5–8.1)

## 2020-08-05 LAB — CBC WITH DIFFERENTIAL/PLATELET
Abs Immature Granulocytes: 0.04 10*3/uL (ref 0.00–0.07)
Basophils Absolute: 0 10*3/uL (ref 0.0–0.1)
Basophils Relative: 0 %
Eosinophils Absolute: 0 10*3/uL (ref 0.0–0.5)
Eosinophils Relative: 0 %
HCT: 37.9 % — ABNORMAL LOW (ref 39.0–52.0)
Hemoglobin: 13.5 g/dL (ref 13.0–17.0)
Immature Granulocytes: 1 %
Lymphocytes Relative: 29 %
Lymphs Abs: 1.4 10*3/uL (ref 0.7–4.0)
MCH: 34.2 pg — ABNORMAL HIGH (ref 26.0–34.0)
MCHC: 35.6 g/dL (ref 30.0–36.0)
MCV: 95.9 fL (ref 80.0–100.0)
Monocytes Absolute: 0.6 10*3/uL (ref 0.1–1.0)
Monocytes Relative: 11 %
Neutro Abs: 2.9 10*3/uL (ref 1.7–7.7)
Neutrophils Relative %: 59 %
Platelets: 164 10*3/uL (ref 150–400)
RBC: 3.95 MIL/uL — ABNORMAL LOW (ref 4.22–5.81)
RDW: 11.9 % (ref 11.5–15.5)
WBC: 5 10*3/uL (ref 4.0–10.5)
nRBC: 0 % (ref 0.0–0.2)

## 2020-08-05 LAB — MAGNESIUM: Magnesium: 1.7 mg/dL (ref 1.7–2.4)

## 2020-08-05 MED ORDER — SODIUM CHLORIDE 0.9 % IV SOLN
1200.0000 mg | Freq: Once | INTRAVENOUS | Status: AC
  Administered 2020-08-05: 1200 mg via INTRAVENOUS
  Filled 2020-08-05: qty 20

## 2020-08-05 MED ORDER — SODIUM CHLORIDE 0.9% FLUSH
10.0000 mL | INTRAVENOUS | Status: DC | PRN
  Administered 2020-08-05: 10 mL via INTRAVENOUS
  Filled 2020-08-05: qty 10

## 2020-08-05 MED ORDER — HEPARIN SOD (PORK) LOCK FLUSH 100 UNIT/ML IV SOLN
500.0000 [IU] | Freq: Once | INTRAVENOUS | Status: AC
  Administered 2020-08-05: 500 [IU] via INTRAVENOUS
  Filled 2020-08-05: qty 5

## 2020-08-05 MED ORDER — SODIUM CHLORIDE 0.9 % IV SOLN
Freq: Once | INTRAVENOUS | Status: AC
  Filled 2020-08-05: qty 250

## 2020-08-05 NOTE — Patient Instructions (Signed)
Scott Jordan  Discharge Instructions: Thank you for choosing [facility] to provide your oncology and hematology care.  If you have a lab appointment with the [facility], please go directly to the [facility] and check in at the registration area.  Wear comfortable clothing and clothing appropriate for easy access to any Portacath or PICC line.   We strive to give you quality time with your provider. You may need to reschedule your appointment if you arrive late (15 or more minutes).  Arriving late affects you and other patients whose appointments are after yours.  Also, if you miss three or more appointments without notifying the office, you may be dismissed from the clinic at the provider's discretion.      For prescription refill requests, have your pharmacy contact our office and allow 72 hours for refills to be completed.    Today you received the following chemotherapy and/or immunotherapy agents  Tecentriq      To help prevent nausea and vomiting after your treatment, we encourage you to take your nausea medication as directed.  BELOW ARE SYMPTOMS THAT SHOULD BE REPORTED IMMEDIATELY: . *FEVER GREATER THAN 100.4 F (38 C) OR HIGHER . *CHILLS OR SWEATING . *NAUSEA AND VOMITING THAT IS NOT CONTROLLED WITH YOUR NAUSEA MEDICATION . *UNUSUAL SHORTNESS OF BREATH . *UNUSUAL BRUISING OR BLEEDING . *URINARY PROBLEMS (pain or burning when urinating, or frequent urination) . *BOWEL PROBLEMS (unusual diarrhea, constipation, pain near the anus) . TENDERNESS IN MOUTH AND THROAT WITH OR WITHOUT PRESENCE OF ULCERS (sore throat, sores in mouth, or a toothache) . UNUSUAL RASH, SWELLING OR PAIN  . UNUSUAL VAGINAL DISCHARGE OR ITCHING   Items with * indicate a potential emergency and should be followed up as soon as possible or go to the Emergency Department if any problems should occur.  Please show the CHEMOTHERAPY ALERT CARD or IMMUNOTHERAPY ALERT CARD at  check-in to the Emergency Department and triage nurse.  Should you have questions after your visit or need to cancel or reschedule your appointment, please contact [facility]  [phone] and follow the prompts.  Office hours are 8:00 a.m. to 4:30 p.m. Monday - Friday. Please note that voicemails left after 4:00 p.m. may not be returned until the following business day.  We are closed weekends and major holidays. You have access to a nurse at all times for urgent questions. Please call the main number to the clinic [phone] and follow the prompts.  For any non-urgent questions, you may also contact your provider using MyChart. We now offer e-Visits for anyone 59 and older to request care online for non-urgent symptoms. For details visit [email or web address].   Also download the MyChart app! Go to the app store, search "MyChart", open the app, select [facility], and log in with your MyChart username and password.  Due to Covid, a mask is required upon entering the hospital/clinic. If you do not have a mask, one will be given to you upon arrival. For doctor visits, patients may have 1 support person aged 8 or older with them. For treatment visits, patients cannot have anyone with them due to current Covid guidelines and our immunocompromised population.

## 2020-08-05 NOTE — Progress Notes (Signed)
[facility]  [address], [address] [place], [place] [address] Phone: [phone]  Fax: [phone]   Clinic Day:  08/05/20  Referring physician: No ref. provider found  Chief Complaint: Scott Jordan is a 70 y.o. male with extensive stage small cell lung cancer who is seen for assessment prior to cycle #14 maintenance Tecentriq.  HPI: The patient was last seen in the medical oncology clinic on 07/15/2020 for cycle 13 maintenance Tecentriq. At that time, he felt pretty good.  Breathing was stable.  He was eating 3-4 meals daily.  Weight was up 4 pounds.  Denied any nausea, vomiting or diarrhea.  Had some dizziness with standing.  Had a left arm pain which was stable.   He is taking his Eliquis as prescribed.  Head CT without contrast on 06/24/2020 revealed no acute or traumatic findings. There were chronic small-vessel ischemic changes of the cerebral hemispheric white matter. There was old lacunar infarction in the radiating white matter tracts on the left adjacent to the body of the lateral ventricle.  The patient saw Gayland Curry, [place] in neurology on 06/26/2020.  EMG is scheduled.  He had follow-up with Dr. Donella Stade on 07/29/2020 s/p PCI 6 months ago.  He was doing well.  He met with Dr. Allen Norris (GI) on 08/03/2020 for hepatitis C and Hep B secondary to IV drug use.  Plan is for repeat colonoscopy after lung cancer treatment and good prognosis.  During the interim, he has been doing well.  His left hand is still giving him trouble.  He is unable to completely close his hand due to stiffness and weakness.  He is unable to bend his left pointer finger.  His appetite is good.  He is eating more.  Past Medical History:  Diagnosis Date  . A-fib ([place]) 01/10/2019   pt st this was dx by Dr. Ubaldo Glassing  . Cancer ([place]) 2021   LUNG  . Complication of anesthesia    DIFFICULTY WAKING UP AFTER SURGERY- 20 YRS AGO  . Hypertension   . Lung cancer ([place])   . Substance abuse [facility])      Past Surgical History:  Procedure Laterality Date  . ARM DEBRIDEMENT Left    INCISION AND DEBRIDEMENT LOWER ARM -20 YRS AGO  . BACK SURGERY    . PORTACATH PLACEMENT Left 08/29/2019   Procedure: INSERTION PORT-A-CATH;  Surgeon: Nestor Lewandowsky, MD;  Location: [facility] ORS;  Service: General;  Laterality: Left;    No family history on file.  Social History:  reports that he has quit smoking. His smoking use included cigarettes. He smoked 0.50 packs per day. He has never used smokeless tobacco. He reports current alcohol use. He reports previous drug use. Drug: Heroin. The patient denies any exposure to radiation or toxins. The patient lives in [place]. He is dependent on public transportation or transportation from friends/family. He has family friends staying with him to help care for his needs.His niece, [name] [phone]), is his medical power of attorney. The patient is alone today.   Allergies: No Known Allergies  Current Medications: Current Outpatient Medications  Medication Sig Dispense Refill  . calcium carbonate (OS-CAL) 600 MG TABS tablet Take 600 mg by mouth daily with breakfast.    . cholecalciferol (VITAMIN D3) 25 MCG (1000 UNIT) tablet Take 1,000 Units by mouth daily.    . Cyanocobalamin (VITAMIN B 12) 500 MCG TABS Take by mouth.    . diltiazem (CARDIZEM) 30 MG tablet Take by mouth.    Marland Kitchen  diltiazem (TIAZAC) 240 MG 24 hr capsule Take 1 capsule by mouth daily.    Marland Kitchen ELIQUIS 5 MG TABS tablet Take 5 mg by mouth 2 (two) times daily.    . feeding supplement, ENSURE ENLIVE, (ENSURE ENLIVE) LIQD Take 237 mLs by mouth 3 (three) times daily between meals. 237 mL 12  . lidocaine-prilocaine (EMLA) cream Apply to affected area once 30 g 3  . Magnesium 500 MG CAPS Take 500 mg by mouth daily.    . mirtazapine (REMERON) 15 MG tablet Take by mouth.    . naloxone (NARCAN) nasal spray 4 mg/0.1 mL once as needed    . nystatin (MYCOSTATIN) 100000 UNIT/ML suspension Use as directed 5 mLs  (500,000 Units total) in the mouth or throat 4 (four) times daily. Swish and spit 60 mL 0  . ondansetron (ZOFRAN) 8 MG tablet Take 1 tablet (8 mg total) by mouth every 8 (eight) hours as needed for refractory nausea / vomiting. Start on day 3 after carboplatin chemo. 30 tablet 1  . potassium chloride SA (KLOR-CON) 20 MEQ tablet TAKE 1 TABLET BY MOUTH DAILY FOR 3 DAYS (Patient taking differently: Take 20 mEq by mouth daily.) 3 tablet 0  . sodium chloride 1 g tablet Take 1 tablet (1 g total) by mouth 3 (three) times daily with meals. 90 tablet 1   No current facility-administered medications for this visit.   Facility-Administered Medications Ordered in Other Visits  Medication Dose Route Frequency Provider Last Rate Last Admin  . heparin lock flush 100 unit/mL  500 Units Intravenous Once Corcoran, Melissa C, MD      . heparin lock flush 100 unit/mL  500 Units Intravenous Once Faythe Casa E, NP      . sodium chloride flush (NS) 0.9 % injection 10 mL  10 mL Intravenous PRN Lequita Asal, MD   10 mL at 11/11/19 0812  . sodium chloride flush (NS) 0.9 % injection 10 mL  10 mL Intravenous PRN Jacquelin Hawking, NP   10 mL at 08/05/20 3570    Review of Systems  Constitutional: Positive for malaise/fatigue. Negative for chills, fever and weight loss.  HENT: Negative for congestion, ear pain and tinnitus.   Eyes: Negative.  Negative for blurred vision and double vision.  Respiratory: Negative.  Negative for cough, sputum production and shortness of breath.   Cardiovascular: Negative.  Negative for chest pain, palpitations and leg swelling.  Gastrointestinal: Negative.  Negative for abdominal pain, constipation, diarrhea, nausea and vomiting.  Genitourinary: Negative for dysuria, frequency and urgency.  Musculoskeletal: Negative for back pain and falls.       Left hand weakness and stiffness  Skin: Negative.  Negative for rash.  Neurological: Positive for weakness. Negative for headaches.   Endo/Heme/Allergies: Negative.  Does not bruise/bleed easily.  Psychiatric/Behavioral: Negative.  Negative for depression. The patient is not nervous/anxious and does not have insomnia.    Performance status (ECOG): 1  Vitals There were no vitals taken for this visit.   Physical Exam Constitutional:      Appearance: Normal appearance.  HENT:     Head: Normocephalic and atraumatic.  Eyes:     Pupils: Pupils are equal, round, and reactive to light.  Cardiovascular:     Rate and Rhythm: Normal rate and regular rhythm.     Heart sounds: Normal heart sounds. No murmur heard.   Pulmonary:     Effort: Pulmonary effort is normal.     Breath sounds: Normal breath sounds. No  wheezing.  Abdominal:     General: Bowel sounds are normal. There is no distension.     Palpations: Abdomen is soft.     Tenderness: There is no abdominal tenderness.  Musculoskeletal:     Left hand: Deformity and tenderness present. Decreased range of motion. Decreased strength. Decreased sensation.     Cervical back: Normal range of motion.  Skin:    General: Skin is warm and dry.     Findings: No rash.  Neurological:     Mental Status: He is alert and oriented to person, place, and time.  Psychiatric:        Judgment: Judgment normal.     Imaging studies: 08/19/2019:  Chest CT without contrast revealed the left pleural pigtail drain. There was a small residual left hydropneumothorax. There was a 6.7 x 4 cm medial basilar left lower lobe lung masssuspicious for primary bronchogenic carcinoma. There was a lingular 1.0 cm solid pulmonary nodulesuspicious for ipsilateral metastasis. There was intralobular septal thickening and patchy groundglass opacity throughout the lingula and the left lower lobe with moderate residual atelectasis of the left lung base. A component of lymphangitic carcinomatosis was suspected. There was ipsilateral infra hilar, subcarinal, AP window, prevascular and upper retroperitoneal  adenopathyc/wmetastatic disease. 08/21/2019:  Head MRI revealed no evidence of metastatic disease. 08/29/2019:  PET scan revealed markedly hypermetabolic left lower lobe pulmonary lesion (SUV 7.9) c/w the patient's known malignancy. There was associated hypermetabolic nodal metastases in the mediastinum and left hilum with metastatic involvement of left pleural space. Additional sites included upper abdominal hypermetabolic lymphadenopathy and scattered hypermetabolic bone metastases (left acetabulum, sacrum, and L5). There was interval decrease in left pleural effusion with areas of loculation. 10/11/2019:  Chest, abdomen, and pelvis CT revealed considerable reduction in size of prior hypermetabolic lymph nodes in the chest and abdomen, compatible with response to therapy. There was notably reduced size of the left pleural effusion although some complexity or loculation persists. The previous areas of hypermetabolic nodularity along the pleural surface were not obviously nodular along the pleural surface. There was considerable reduction in size of the previously consolidated/collapsed left lower lobe, although there was still some residual indistinct opacity in this region. The previous hypermetabolic osseous metastatic lesions were relatively occult.  There was mild airway thickening was present, suggesting bronchitis or reactive airways disease. There was trace pelvic ascites, improved from prior. There was chronic deformity of the left medial clavicle likely due to an old fracture with some deformity of the left sternoclavicular joint. 11/23/2019: Chest CT angiogram revealed no pulmonary embolus.  There was new patchy and ground-glass opacity in the dependent right lower lobe, perifissural right middle and right upper lobe. Findings were suspicious for pneumonia, including aspiration or atypical viral organisms such as COVID-19. There was a small left pleural effusion with slight increase size from prior  exam. Volume loss in the left lower lobe with areas of ill-defined consolidation, was not significantly changed from prior. Right hilar adenopathy was new from prior exam, nonspecific in this setting. Recommend attention at follow-up. There was debris within the trachea and to a lesser extent right mainstem bronchus. There was right lower lobe bronchial thickening with areas of mucous plugging.  11/29/2019:  Abdomen and pelvis CT revealed residual patchy nodular foci of consolidation at the right lung base, overall decreased since 11/22/2019 favoring resolving bronchopneumonia.  Follow-up chest CT was advised.  There was a stable small dependent left pleural effusion and stable top normal size previously hypermetabolic left periaortic  lymph node.  There was stable faint sclerotic left pelvic bone lesions.  There were no new or progressive metastatic disease in the abdomen or pelvis. 03/09/2020:  CXR showed left base atelectasis with small left pleural effusion, resent on prior CT examination. There was no new opacity evident.  There was a stable cardiac silhouette. There was no adenopathy evident. Port-A-Cath tip was at the cavoatrial junction. 03/10/2020:  Head MRI with and without contrast revealed no evidence of intracranial metastatic disease. There were remote small infarcts in the left corona radiata and left thalamus, unchanged. There was stable moderate white matter disease, likely representing chronic small vessel ischemic disease. There were bilateral mastoid effusions. 03/10/2020:  Cervical spine MRI with and without contrast revealed no evidence of metastatic disease to the cervical spine. There were multilevel degenerative changes of the cervical spine, worst at C5-C6 where there was moderate spinal canal stenosis and severe bilateral neural foraminal narrowing. There was severe right neural foraminal narrowing at C4-5. There was moderate to severe left neural foraminal narrowing at  C3-4. 03/19/2020:  PET scan revealed mild patchy left lower lobe opacity, stable versus mildly improved, without appreciable hypermetabolism on PET. While a small focus of residual tumor was difficult to entirely exclude, for the most part this favored treated disease.  There were no findings suspicious for metastatic disease. 06/23/2020:  Chest, abdomen, and pelvis CT revealed continued volume loss and confluent bandlike airspace opacity at the left lung base in the lower lobes suggesting prior regional therapy and/or scarring.  There was a new 0.6 x 0.4 cm pleural-based nodule in the left upper lobe adjacent to the lingula, probably benign/inflammatory but merits surveillance.  The previous metastatic lesions to the left iliac bone and ischium are no longer well seen on CT. No new metastatic lesion were identified.  Other imaging findings of potential clinical significance: coronary atherosclerosis, borderline cardiomegaly, contrast medium in the distal esophagus possibly from dysmotility or reflux, cholelithiasis, right mid kidney cyst, and emphysema and aortic atherosclerosis. 06/24/2020:  Head CT without contrast revealed no acute or traumatic findings. There were chronic small-vessel ischemic changes of the cerebral hemispheric white matter. There was old lacunar infarction in the radiating white matter tracts on the left adjacent to the body of the lateral ventricle.   Hospital Outpatient Visit on 08/03/2020  Component Date Value Ref Range Status  . Fibrosis Score 08/03/2020 0.64* 0.00 - 0.21 Final  . Fibrosis Stage 08/03/2020 Bridging fibrosis with many septa   Final   Comment: Comment  F3   . Necroinflammat Activity Score 08/03/2020 0.06  0.00 - 0.17 Final  . Necroinflammat Activity Grade 08/03/2020 No activity   Final   Comment: SEE COMMENT RESULT:A0   . ALPHA 2-MACROGLOBULINS, QN 08/03/2020 325* 110 - 276 mg/dL Final  . Haptoglobin 08/03/2020 95  32 - 363 mg/dL Final  . Apolipoprotein  A-1 08/03/2020 124  101 - 178 mg/dL Final  . Bilirubin, Total 08/03/2020 0.5  0.0 - 1.2 mg/dL Final  . GGT 08/03/2020 25  0 - 65 IU/L Final  . ALT (SGPT) P5P 08/03/2020 12  0 - 55 IU/L Final  . Interpretations: 08/03/2020 Comment   Final   Comment: (NOTE) Quantitative results of 6 biochemical tests are analyzed using a computational algorithm to provide a quantitative surrogate marker (0.0-1.0) for liver fibrosis (METAVIR F0-F4) and for necroinflammatory activity (METAVIR A0-A3).   . Fibrosis Scoring: 08/03/2020 Comment   Final   Comment: (NOTE)     <=0.21 = Stage F0 -  No fibrosis 0.21 - 0.27 = Stage F0 - F1 0.27 - 0.31 = Stage F1 - Portal fibrosis 0.31 - 0.48 = Stage F1 - F2 0.48 - 0.58 = Stage F2 - Bridging fibrosis with few septa 0.58 - 0.72 = Stage F3 - Bridging fibrosis with many septa 0.72 - 0.74 = Stage F3 - F4      >0.74 = Stage F4 - Cirrhosis   . Necroinflamm Activity Scoring: 08/03/2020 Comment   Final   Comment: (NOTE)      <0.17 = Grade A0 - No Activity 0.17 - 0.29 = Grade A0 - A1 0.29 - 0.36 = Grade A1 - Minimal activity 0.36 - 0.52 = Grade A1 - A2 0.52 - 0.60 = Grade A2 - Moderate activity 0.60 - 0.62 = Grade A2 - A3      >0.62 = Grade A3 - Severe activity   . Limitations: 08/03/2020 Comment   Final   Comment: (NOTE) The negative predictive value of a Fibrotest score <0.31 (absence of clinically significant fibrosis) was 85% when compared to liver biopsy in 1,270 HCV infected patients with a 38% prevalence of significant liver fibrosis (F2, 3 or 4). The positive predictive value of a Fibro-test score >0.48 (F2, 3, 4) was 61% in that same patient cohort. HCV FibroSURE is not recommended in patients with Rosanna Randy Disease, acute hemolysis (e.g. HCV ribavirin therapy mediated hemolysis) acute hepa-titis of the liver, extra-hepatic cholestasis, transplant patients, and/or renal insufficiency patients.  Any of these clinical situations may lead to inaccurate  quantitative predictions of fibrosis and necroinflammatory activity in the liver.   . Comment 08/03/2020 Comment   Final   Comment: (NOTE) This test was developed and its performance characteristics determined by [facility].  It has not been cleared or approved by the Food and Drug Administration.  The FDA has determined that such clearance or approval is not necessary. For questions regarding this report please contact customer service at [phone]. Performed At: [facility] [address] [place], [place] [id number] Rush Farmer MD OE:[id number]     Assessment:  Scott Jordan is a 70 y.o. male with extensive stage small cell lung cancer. He has a >40 pack year smoking. He presented with a recurrent left sided pleural effusion. He has undergone thoracentesis x 2 in the past month. He is s/p pigtail drain. Initial cytology x 2 was negative (lymphocytic exudative effusion).Pleural fluid cytologyon 08/18/2019 confirmed small cell lung cancer.  Chest CT without contraston 08/19/2019 revealed the left pleural pigtail drain. There was a small residual left hydropneumothorax. There was a 6.7 x 4 cm medial basilar left lower lobe lung masssuspicious for primary bronchogenic carcinoma. There was a lingular 1.0 cm solid pulmonary nodulesuspicious for ipsilateral metastasis. There was intralobular septal thickening and patchy groundglass opacity throughout the lingula and the left lower lobe with moderate residual atelectasis of the left lung base. A component of lymphangitic carcinomatosis was suspected. There was ipsilateral infra hilar, subcarinal, AP window, prevascular and upper retroperitoneal adenopathyc/wmetastatic disease.  Head MRI on 08/21/2019 revealed no evidence of metastatic disease.  PET scan on 05/20/20201 revealed markedly hypermetabolic left lower lobe pulmonary lesion (SUV 7.9) c/w the patient's known malignancy. There was associated hypermetabolic  nodal metastases in the mediastinum and left hilum with metastatic involvement of left pleural space. Additional sites included upper abdominal hypermetabolic lymphadenopathy and scattered hypermetabolic bone metastases (left acetabulum, sacrum, and L5). There was interval decrease in left pleural effusion with areas of loculation.  CEA was 3.7 on  09/02/2019, 3.8 on 02/17/2020 and 3.1 on 05/13/2020.  LDH was 206 on 09/23/2019, 144 on 10/16/2019 and 102 on 05/13/2020.  He is s/p 4 cycles of carboplatin, etoposide, and Tecentriq (09/02/2019- 11/11/2019) with Margarette Canada support.  He is s/p 8 cycles of maintenance Tecentriq (12/02/2019 - 02/17/2020; 04/22/2020 - 06/24/2020).  He received PCI from 01/09/2020 - 01/22/2020.   Head MRI on 03/10/2020 revealed no evidence of intracranial metastatic disease. There were remote small infarcts in the left corona radiata and left thalamus, unchanged. There was stable moderate white matter disease, likely representing chronic small vessel ischemic disease. There were bilateral mastoid effusions.  Cervical spine MRI on 03/10/2020 revealed no evidence of metastatic disease to the cervical spine. There were multilevel degenerative changes of the cervical spine, worst at C5-C6 where there was moderate spinal canal stenosis and severe bilateral neural foraminal narrowing. There was severe right neural foraminal narrowing at C4-5. There was moderate to severe left neural foraminal narrowing at C3-4. Head CT without contrast on 06/24/2020 revealed no acute or traumatic findings. There were chronic small-vessel ischemic changes of the cerebral hemispheric white matter. There was old lacunar infarction in the radiating white matter tracts on the left adjacent to the body of the lateral ventricle.  Chest, abdomen, and pelvis CT on 06/23/2020 revealed continued volume loss and confluent bandlike airspace opacity at the left lung base in the lower lobes suggesting prior regional therapy  and/or scarring. There was a new 0.6 x 0.4 cm pleural-based nodule in the left upper lobe adjacent to the lingula, probably benign/inflammatory but merits surveillance.  The previous metastatic lesions to the left iliac bone and ischium are no longer well seen on CT. No new metastatic lesion were identified.    He was admitted to [facility] from 11/22/2019 - 11/25/2019 with a heroin overdose and aspiration pneumonia.  He received 1 unit of PRBCs.  He was treated with Zosyn.  He was discharged on Augmentin x5 days.  He has hyponatremiasecondary to SIADH.  He began sodium chloride tablets (1 gm TID) on 09/04/2019.  He has B12 deficiency.  B12 was 227 on 03/23/2020 and 646 on 05/13/2020.   He is on oral B12.  Folate was 19.2 on 04/22/2020.  He has left upper extremity symptoms c/w a left-sided C6 radiculopathy as well as ulnar neuropathy.  He has elevated LFTs. Hepatitis C antibody and hepatitis B core antibody total were positive on 12/23/2019.  Hepatitis B surface antigen, hepatitis B core IgM and hepatitis A IgM were negative.  Hepatitis B DNA was not detected. Hepatitis C RNA quantitative revealed 3.869 log 10 IU/mL (7400 IU/mL).  Hepatitis C genotype is 2b.  He has a significant family history of malignancy. A family member has had genetic testing (negative for Lynch syndrome).  Symptomatically, he feels "pretty good."  He denies any respiratory concerns. He is trying to eat 3-4 meals per day; weight is stable. Left hand is worse. He is unable to bend left pointer finger. Started treatment (EMG) yesterday at [facility]. No improvement yet noted.  Plan: 1.   Labs today: CBC with diff, CMP, Mg. 2. Metastatic small cell lung cancer -Clinically he is doing well.  -Today exam is stable  -He is s/p 4 cycles of carboplatin, etoposide and Tecentriq (last 11/11/2019). -Chest CT angiogram plus abdomen and pelvis CT in 08/2021revealed adramatic response to treatment.  His but and his 6  months of alcohol  -He completed PCI on 01/22/2020. -He iss/p 13cycles of maintenance Tecentriq . -Cycle #2  maintenance Tecentriq was held secondary to increased LFTs on 12/30/2019. -no evidence for Tecentriq induced hepatitis. -Etiology felt secondary to possible alcohol or less likely hepatitis C.  -Tecentriq was held secondary to unexplained neuropathy in his left upper extremity. -PET scan on 03/19/2020 revealed no clear evidence of recurrent disease.  -Chest, abdomen, and pelvis CT on 06/23/2020 revealed a new 0.6 x 0.4 cm pleural-based nodule in the left upper lobe adjacent to the lingula.   -Etiology felt benign/inflammatory.     -Anticipate f/u chest CT in 3 months.  -Labs from today 08/05/2020 are adequate for treatment.  -Proceed with cycle 14 Tecentriq today.  -Patient has questions about oral treatment options versus continuing IV Tecentriq. Will let him discuss with oncologist at next visit.  3. History of atrial fibrillation/flutter -He denies any bruising or bleeding.  -Continue Eliquis. 4. Left C6 radiculopathy and ulnar neuropathy  -He started his first EMG treatment yesterday.  - Concern is only on his left hand.   -Left-sided symptoms appear related to possible nerve entrapment at the elbow.  -He is followed by [facility] Dr. Manuella Ghazi Dr. Cari Caraway  -Work-up included head MRI on 03/10/2020 revealed no evidence of intracranial metastatic disease. Cervical spine MRI with and without contrast on 03/10/2020 confirmed no metastatic disease. 5.B12 deficiency  -continue oral B12.  -Last B12 level was 646 on 05/13/2020. 6.  Hepatitis C:  -She is followed by GI doctor Dr. Allen Norris and saw him on 08/03/2020.  -Plan is to hold off on treatment for hep C until after his treatment for his lung cancer is complete and he has a good prognosis.  Plan: -Proceed with cycle 14  maintenance Tecentriq today. -RTC in 3 weeks for MD assessment, labs (CBC with diff, CMP, Mg, TSH), and cycle #10 maintenance Tecentriq.  I discussed the assessment and treatment plan with the patient.  The patient was provided an opportunity to ask questions and all were answered.  The patient agreed with the plan and demonstrated an understanding of the instructions.  The patient was advised to call back if the symptoms worsen or if the condition fails to improve as anticipated.  Greater than 50% was spent in counseling and coordination of care with this patient including but not limited to discussion of the relevant topics above (See A&P) including, but not limited to diagnosis and management of acute and chronic medical conditions.   Faythe Casa, NP 08/05/2020 8:52 AM

## 2020-08-06 LAB — T4: T4, Total: 8 ug/dL (ref 4.5–12.0)

## 2020-08-10 DIAGNOSIS — I480 Paroxysmal atrial fibrillation: Secondary | ICD-10-CM | POA: Diagnosis not present

## 2020-08-10 DIAGNOSIS — R002 Palpitations: Secondary | ICD-10-CM | POA: Diagnosis not present

## 2020-08-18 ENCOUNTER — Telehealth: Payer: Self-pay

## 2020-08-18 NOTE — Telephone Encounter (Signed)
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

## 2020-08-18 NOTE — Telephone Encounter (Signed)
Pt notified of lab result. Paperwork completed and faxed to [place].

## 2020-08-27 ENCOUNTER — Encounter: Payer: Self-pay | Admitting: Oncology

## 2020-08-27 ENCOUNTER — Inpatient Hospital Stay: Payer: Medicare Other

## 2020-08-27 ENCOUNTER — Other Ambulatory Visit: Payer: Medicare Other

## 2020-08-27 ENCOUNTER — Inpatient Hospital Stay: Payer: Medicare Other | Attending: Nurse Practitioner | Admitting: Oncology

## 2020-08-27 ENCOUNTER — Other Ambulatory Visit: Payer: Self-pay

## 2020-08-27 VITALS — BP 161/116 | HR 106

## 2020-08-27 VITALS — BP 155/112 | HR 55 | Temp 96.2°F | Resp 16 | Wt 135.3 lb

## 2020-08-27 DIAGNOSIS — F17210 Nicotine dependence, cigarettes, uncomplicated: Secondary | ICD-10-CM | POA: Diagnosis not present

## 2020-08-27 DIAGNOSIS — C3490 Malignant neoplasm of unspecified part of unspecified bronchus or lung: Secondary | ICD-10-CM

## 2020-08-27 DIAGNOSIS — C3432 Malignant neoplasm of lower lobe, left bronchus or lung: Secondary | ICD-10-CM | POA: Insufficient documentation

## 2020-08-27 DIAGNOSIS — Z79899 Other long term (current) drug therapy: Secondary | ICD-10-CM | POA: Insufficient documentation

## 2020-08-27 DIAGNOSIS — B182 Chronic viral hepatitis C: Secondary | ICD-10-CM | POA: Insufficient documentation

## 2020-08-27 DIAGNOSIS — C7951 Secondary malignant neoplasm of bone: Secondary | ICD-10-CM | POA: Insufficient documentation

## 2020-08-27 DIAGNOSIS — I4891 Unspecified atrial fibrillation: Secondary | ICD-10-CM | POA: Insufficient documentation

## 2020-08-27 DIAGNOSIS — Z7901 Long term (current) use of anticoagulants: Secondary | ICD-10-CM | POA: Diagnosis not present

## 2020-08-27 DIAGNOSIS — C781 Secondary malignant neoplasm of mediastinum: Secondary | ICD-10-CM | POA: Insufficient documentation

## 2020-08-27 DIAGNOSIS — Z5112 Encounter for antineoplastic immunotherapy: Secondary | ICD-10-CM

## 2020-08-27 DIAGNOSIS — I10 Essential (primary) hypertension: Secondary | ICD-10-CM | POA: Insufficient documentation

## 2020-08-27 DIAGNOSIS — E538 Deficiency of other specified B group vitamins: Secondary | ICD-10-CM | POA: Diagnosis not present

## 2020-08-27 DIAGNOSIS — G5622 Lesion of ulnar nerve, left upper limb: Secondary | ICD-10-CM | POA: Diagnosis not present

## 2020-08-27 LAB — CBC WITH DIFFERENTIAL/PLATELET
Abs Immature Granulocytes: 0.06 10*3/uL (ref 0.00–0.07)
Basophils Absolute: 0 10*3/uL (ref 0.0–0.1)
Basophils Relative: 0 %
Eosinophils Absolute: 0 10*3/uL (ref 0.0–0.5)
Eosinophils Relative: 0 %
HCT: 36.8 % — ABNORMAL LOW (ref 39.0–52.0)
Hemoglobin: 13.2 g/dL (ref 13.0–17.0)
Immature Granulocytes: 1 %
Lymphocytes Relative: 31 %
Lymphs Abs: 1.3 10*3/uL (ref 0.7–4.0)
MCH: 33.8 pg (ref 26.0–34.0)
MCHC: 35.9 g/dL (ref 30.0–36.0)
MCV: 94.4 fL (ref 80.0–100.0)
Monocytes Absolute: 0.4 10*3/uL (ref 0.1–1.0)
Monocytes Relative: 9 %
Neutro Abs: 2.5 10*3/uL (ref 1.7–7.7)
Neutrophils Relative %: 59 %
Platelets: 176 10*3/uL (ref 150–400)
RBC: 3.9 MIL/uL — ABNORMAL LOW (ref 4.22–5.81)
RDW: 12.7 % (ref 11.5–15.5)
WBC: 4.3 10*3/uL (ref 4.0–10.5)
nRBC: 0 % (ref 0.0–0.2)

## 2020-08-27 LAB — COMPREHENSIVE METABOLIC PANEL
ALT: 12 U/L (ref 0–44)
AST: 18 U/L (ref 15–41)
Albumin: 3.8 g/dL (ref 3.5–5.0)
Alkaline Phosphatase: 75 U/L (ref 38–126)
Anion gap: 6 (ref 5–15)
BUN: 8 mg/dL (ref 8–23)
CO2: 25 mmol/L (ref 22–32)
Calcium: 9.1 mg/dL (ref 8.9–10.3)
Chloride: 107 mmol/L (ref 98–111)
Creatinine, Ser: 0.69 mg/dL (ref 0.61–1.24)
GFR, Estimated: >60 mL/min (ref >60)
Glucose, Bld: 101 mg/dL — ABNORMAL HIGH (ref 70–99)
Potassium: 3.8 mmol/L (ref 3.5–5.1)
Sodium: 138 mmol/L (ref 135–145)
Total Bilirubin: 1.2 mg/dL (ref 0.3–1.2)
Total Protein: 7.3 g/dL (ref 6.5–8.1)

## 2020-08-27 LAB — TSH: TSH: 2.198 u[IU]/mL (ref 0.350–4.500)

## 2020-08-27 LAB — MAGNESIUM: Magnesium: 1.6 mg/dL — ABNORMAL LOW (ref 1.7–2.4)

## 2020-08-27 MED ORDER — MAGNESIUM CHLORIDE 64 MG PO TBEC: 1.0000 | DELAYED_RELEASE_TABLET | Freq: Every day | ORAL | 1 refills | Status: DC

## 2020-08-27 MED ORDER — ATEZOLIZUMAB CHEMO INJECTION 1200 MG/20ML
1200.0000 mg | Freq: Once | INTRAVENOUS | Status: AC
Start: 2020-08-27 — End: 2020-08-27
  Administered 2020-08-27: 1200 mg via INTRAVENOUS
  Filled 2020-08-27: qty 20

## 2020-08-27 MED ORDER — SODIUM CHLORIDE 0.9 % IV SOLN
Freq: Once | INTRAVENOUS | Status: AC
  Filled 2020-08-27: qty 250

## 2020-08-27 MED ORDER — SODIUM CHLORIDE 0.9 % IV SOLN
1200.0000 mg | Freq: Once | INTRAVENOUS | Status: DC
  Filled 2020-08-27: qty 20

## 2020-08-27 MED ORDER — HEPARIN SOD (PORK) LOCK FLUSH 100 UNIT/ML IV SOLN
500.0000 [IU] | Freq: Once | INTRAVENOUS | Status: AC | PRN
  Administered 2020-08-27: 500 [IU]
  Filled 2020-08-27: qty 5

## 2020-08-27 NOTE — Progress Notes (Signed)
Pt states he received medication for Hep C yesterday and will like to know if he can start it. Also, pt states he has not taking his BP meds in over a month, spoke to his doctor about it and was told it's okay. Also, feeling very weak can't seem to get his strength back.

## 2020-08-27 NOTE — Progress Notes (Signed)
[facility]  [address] [place], [place] [address] Phone: [phone]  Fax: [phone]   Clinic Day:  08/27/20  Referring physician: No ref. provider found  Chief Complaint: Scott Jordan is a 70 y.o. male with extensive stage small cell lung cancer who is seen for assessment prior to cycle #9 maintenance Tecentriq.  PERTINENT ONCOLOGY HISTORY Scott Jordan is a 70 y.o.[place] who has above oncology history reviewed by me today presented for follow up visit for management of extensive small cell lung cancer Patient previously followed up by Dr.Corcoran, patient switched care to me on 08/27/20 Extensive medical records review was performed by me.  He has a >40 pack year smoking. He presented with a recurrent left sided pleural effusion. He has undergone thoracentesis x 2 in the past month. He is s/p pigtail drain. Initial cytology x 2 was negative (lymphocytic exudative effusion).Pleural fluid cytologyon 08/18/2019 confirmed small cell lung cancer.   08/19/2019 Chest CT without contraston revealed the left pleural pigtail drain. There was a small residual left hydropneumothorax. There was a 6.7 x 4 cm medial basilar left lower lobe lung masssuspicious for primary bronchogenic carcinoma. There was a lingular 1.0 cm solid pulmonary nodulesuspicious for ipsilateral metastasis. There was intralobular septal thickening and patchy groundglass opacity throughout the lingula and the left lower lobe with moderate residual atelectasis of the left lung base. A component of lymphangitic carcinomatosis was suspected. There was ipsilateral infra hilar, subcarinal, AP window, prevascular and upper retroperitoneal adenopathyc/wmetastatic disease.  05/12/2021Brain MRI on  revealed no evidence of metastatic disease.  08/29/2019 PET scan on  revealed markedly hypermetabolic left lower lobe pulmonary lesion (SUV 7.9) hypermetabolic nodal metastases in  the mediastinum and left hilum with metastatic involvement of left pleural space. Additional sites included upper abdominal hypermetabolic lymphadenopathy and scattered hypermetabolic bone metastases (left acetabulum, sacrum, and L5). There was interval decrease in left pleural effusion with areas of loculation.  CEA was 3.7 on 09/02/2019, 3.8 on 02/17/2020 and 3.1 on 05/13/2020.  LDH was 206 on 09/23/2019, 144 on 10/16/2019 and 102 on 05/13/2020.  09/02/2019- 11/11/2019 He is s/p 4 cycles of carboplatin, etoposide, and Tecentriq with Udencya support.   10/11/2019: CT chest abdomen pelvis showed reduction of chest and abdominal lymphadenopathy.  Pleural nodularity improved. 11/22/2019 - 11/25/2019 admission [facility] with a heroin overdose and aspiration pneumonia.  He received 1 unit of PRBCs.  He was treated with Zosyn.  He was discharged on Augmentin x5 days. 12/02/2019 - 02/17/2020  maintenance Tecentriq 04/22/2020 - present  maintenance Tecentriq 01/09/2020 - 01/22/2020 He received PCI  03/10/2020: Brain MRI no metastasis 03/10/2020  Cervical spine MRI revealed no evidence of metastatic disease to the cervical spine.  Multilevel DJD 03/19/2020:  PET scan mild patchy left lower lobe opacity, stable versus mildly improved 06/24/2020 Head CT without contrast: No mets 06/23/2020: CT chest abdomen pelvis: new 0.6 x 0.4 cm pleural-based nodule in the left upper lobe  06/24/2020: Head CT without contrast no metastasis.   Other chronic medical problems.  hyponatremiasecondary to SIADH.  He began sodium chloride tablets (1 gm TID) on 09/04/2019.  B12 deficiency.  B12 was 227 on 03/23/2020 and 646 on 05/13/2020.   He is on oral B12.  Folate was 19.2 on 04/22/2020.   left upper extremity symptoms c/w a left-sided C6 radiculopathy as well as ulnar neuropathy.   Hepatitis C antibody and hepatitis B core antibody total were positive on 12/23/2019.  Hepatitis B surface antigen, hepatitis B  core IgM and  hepatitis A IgM were negative.  Hepatitis B DNA was not detected. Hepatitis C RNA quantitative revealed 3.869 log 10 IU/mL (7400 IU/mL).  Hepatitis C genotype is 2b. He sees gastroenterology  He has a significant family history of malignancy. A family member has had genetic testing (negative for Lynch syndrome).  INTERVAL HISTORY Scott Jordan is a 70 y.o. male who has above history reviewed by me today presents for follow up visit for treatment for his extensive small cell lung cancer. Problems and complaints are listed below: Patient has been on maintenance Tecentriq.  Overall he tolerates well. Denies nausea vomiting diarrhea, fever, shortness of breath, chest pain, diarrhea  Hepatitis C, he follows up with gastroenterology and was recommended to start [place] for hep c treatment.  He has not taken his blood pressure medication for the past few weeks.  Blood pressure was high in the clinic.  Denies any headache, nausea, vision changes Patient follows up with Dr. Ubaldo Glassing and was recently seen and was recommended to continue blood pressure meds.    Past Medical History:  Diagnosis Date  . A-fib ([place]) 01/10/2019   pt st this was dx by Dr. Ubaldo Glassing  . Cancer ([place]) 2021   LUNG  . Complication of anesthesia    DIFFICULTY WAKING UP AFTER SURGERY- 20 YRS AGO  . Hypertension   . Lung cancer ([place])   . Substance abuse [facility])     Past Surgical History:  Procedure Laterality Date  . ARM DEBRIDEMENT Left    INCISION AND DEBRIDEMENT LOWER ARM -20 YRS AGO  . BACK SURGERY    . PORTACATH PLACEMENT Left 08/29/2019   Procedure: INSERTION PORT-A-CATH;  Surgeon: Nestor Lewandowsky, MD;  Location: [facility] ORS;  Service: General;  Laterality: Left;    History reviewed. No pertinent family history.  Social History:  reports that he has quit smoking. His smoking use included cigarettes. He smoked 0.50 packs per day. He has never used smokeless tobacco. He reports current alcohol use. He reports previous drug use.  Drug: Heroin. The patient denies any exposure to radiation or toxins. The patient lives in [place]. He is dependent on public transportation or transportation from friends/family. He has family friends staying with him to help care for his needs.His niece, [name] [phone]), is his medical power of attorney. The patient is alone today.   Allergies: No Known Allergies  Current Medications: Current Outpatient Medications  Medication Sig Dispense Refill  . calcium carbonate (OS-CAL) 600 MG TABS tablet Take 600 mg by mouth daily with breakfast.    . ELIQUIS 5 MG TABS tablet Take 5 mg by mouth 2 (two) times daily.    . feeding supplement, ENSURE ENLIVE, (ENSURE ENLIVE) LIQD Take 237 mLs by mouth 3 (three) times daily between meals. 237 mL 12  . lidocaine-prilocaine (EMLA) cream Apply to affected area once 30 g 3  . Magnesium 500 MG CAPS Take 500 mg by mouth daily.    Marland Kitchen MAVYRET 100-40 MG TABS Take 3 tablets by mouth daily.    . mirtazapine (REMERON) 15 MG tablet Take by mouth.    . potassium chloride SA (KLOR-CON) 20 MEQ tablet TAKE 1 TABLET BY MOUTH DAILY FOR 3 DAYS 3 tablet 0  . sodium chloride 1 g tablet Take 1 tablet (1 g total) by mouth 3 (three) times daily with meals. 90 tablet 1  . cholecalciferol (VITAMIN D3) 25 MCG (1000 UNIT) tablet Take 1,000 Units by mouth daily. (Patient not taking: Reported  on 08/27/2020)    . Cyanocobalamin (VITAMIN B 12) 500 MCG TABS Take by mouth. (Patient not taking: Reported on 08/27/2020)    . diltiazem (CARDIZEM) 30 MG tablet Take by mouth. (Patient not taking: Reported on 08/27/2020)    . diltiazem (TIAZAC) 240 MG 24 hr capsule Take 1 capsule by mouth daily. (Patient not taking: Reported on 08/27/2020)    . naloxone (NARCAN) nasal spray 4 mg/0.1 mL once as needed (Patient not taking: No sig reported)    . ondansetron (ZOFRAN) 8 MG tablet Take 1 tablet (8 mg total) by mouth every 8 (eight) hours as needed for refractory nausea / vomiting. Start on day 3  after carboplatin chemo. (Patient not taking: No sig reported) 30 tablet 1   No current facility-administered medications for this visit.   Facility-Administered Medications Ordered in Other Visits  Medication Dose Route Frequency Provider Last Rate Last Admin  . atezolizumab (TECENTRIQ) 1,200 mg in sodium chloride 0.9 % 250 mL chemo infusion  1,200 mg Intravenous Once Lloyd Huger, MD      . heparin lock flush 100 unit/mL  500 Units Intravenous Once Corcoran, Melissa C, MD      . sodium chloride flush (NS) 0.9 % injection 10 mL  10 mL Intravenous PRN Lequita Asal, MD   10 mL at 11/11/19 8466    Review of Systems  Constitutional: Negative for chills, fever, malaise/fatigue and weight loss.  HENT: Negative for sore throat.   Eyes: Negative for redness.  Respiratory: Positive for sputum production and shortness of breath. Negative for cough and wheezing.   Cardiovascular: Negative for chest pain, palpitations and leg swelling.  Gastrointestinal: Negative for abdominal pain, blood in stool, nausea and vomiting.  Genitourinary: Negative for dysuria.  Musculoskeletal: Positive for joint pain. Negative for myalgias.  Skin: Negative for rash.  Neurological: Negative for dizziness, tingling and tremors.  Endo/Heme/Allergies: Does not bruise/bleed easily.  Psychiatric/Behavioral: Negative for hallucinations.   Performance status (ECOG): 1  Vitals Blood pressure (!) 155/112, pulse (!) 55, temperature (!) 96.2 F (35.7 C), resp. rate 16, weight 135 lb 4 oz (61.4 kg), SpO2 100 %.   Physical Exam Constitutional:      General: He is not in acute distress. HENT:     Head: Normocephalic and atraumatic.  Eyes:     General: No scleral icterus. Cardiovascular:     Rate and Rhythm: Normal rate and regular rhythm.     Heart sounds: Normal heart sounds.  Pulmonary:     Effort: Pulmonary effort is normal. No respiratory distress.     Breath sounds: No wheezing.     Comments:  Bilateral decreased breath sound Abdominal:     General: Bowel sounds are normal. There is no distension.     Palpations: Abdomen is soft.  Musculoskeletal:        General: No deformity. Normal range of motion.     Cervical back: Normal range of motion and neck supple.  Skin:    General: Skin is warm and dry.     Findings: No erythema or rash.  Neurological:     Mental Status: He is alert and oriented to person, place, and time. Mental status is at baseline.     Cranial Nerves: No cranial nerve deficit.     Coordination: Coordination normal.  Psychiatric:        Mood and Affect: Mood normal.     Imaging studies:  Infusion on 08/27/2020  Component Date Value Ref Range Status  .  WBC 08/27/2020 4.3  4.0 - 10.5 K/uL Final  . RBC 08/27/2020 3.90* 4.22 - 5.81 MIL/uL Final  . Hemoglobin 08/27/2020 13.2  13.0 - 17.0 g/dL Final  . HCT 08/27/2020 36.8* 39.0 - 52.0 % Final  . MCV 08/27/2020 94.4  80.0 - 100.0 fL Final  . MCH 08/27/2020 33.8  26.0 - 34.0 pg Final  . MCHC 08/27/2020 35.9  30.0 - 36.0 g/dL Final  . RDW 08/27/2020 12.7  11.5 - 15.5 % Final  . Platelets 08/27/2020 176  150 - 400 K/uL Final  . nRBC 08/27/2020 0.0  0.0 - 0.2 % Final  . Neutrophils Relative % 08/27/2020 59  % Final  . Neutro Abs 08/27/2020 2.5  1.7 - 7.7 K/uL Final  . Lymphocytes Relative 08/27/2020 31  % Final  . Lymphs Abs 08/27/2020 1.3  0.7 - 4.0 K/uL Final  . Monocytes Relative 08/27/2020 9  % Final  . Monocytes Absolute 08/27/2020 0.4  0.1 - 1.0 K/uL Final  . Eosinophils Relative 08/27/2020 0  % Final  . Eosinophils Absolute 08/27/2020 0.0  0.0 - 0.5 K/uL Final  . Basophils Relative 08/27/2020 0  % Final  . Basophils Absolute 08/27/2020 0.0  0.0 - 0.1 K/uL Final  . Immature Granulocytes 08/27/2020 1  % Final  . Abs Immature Granulocytes 08/27/2020 0.06  0.00 - 0.07 K/uL Final   Performed at [facility], [address] [place], [place] [address]    Assessment:  DEZ STAUFFER is a 70  y.o. male with extensive stage small cell lung cancer.  1. Small cell lung cancer ([place])   2. Encounter for antineoplastic immunotherapy   3. Hypomagnesemia   4. B12 deficiency   5. Ulnar neuropathy of left upper extremity   6. Chronic hepatitis C without hepatic coma (HCC)   7. Chronic anticoagulation   8. Uncontrolled hypertension     #Extensive small cell lung cancer, s/p 4 cycles of carboplatin, etoposide and Tecentriq-  Currently on Tecentriq maintenance. Most recent CT scan showed stable disease, except 0.6x 0.4cm pleural based nodule left upper lobe- ?benign/inflammatory. Repeat CT in June Labs reviewed and discussed with patient. Proceed with Tecentriq treatment today  #Uncontrolled hypertension, Patient has not been compliant with blood pressure medication. Recommend patient to resume his BP meds and monitor blood pressure at home.  Continue follow-up with cardiology.  #History of atrial fibrillation/flutter: Continue Eliquis 5 mg twice daily. #Left C6 radiculopathy and ulnar neuropathy likely secondary to nerve entrapment at the elbow. Right MRI on 03/10/2020 revealed no intracranial metastasis.  Cervical spine MRI confirmed no metastasis, severe DJD, bilateral neuroforaminal narrowing. And continue follow-up with Dr. Donneta Romberg and Dr. Cari Caraway.             # B12 deficiency B12 was 227 on 03/23/2020 and 646 on 05/13/2020.  Continue oral vitamin B12 # Hypomagnesia; I will start patient on Slow-Mag. # Hepatitis C, to start [place]   RTC in 3 weeks for MD assessment, labs (CBC with diff, CMP, Mg, TSH), and maintenance Tecentriq.  I discussed the assessment and treatment plan with the patient.  The patient was provided an opportunity to ask questions and all were answered.  The patient agreed with the plan and demonstrated an understanding of the instructions.  The patient was advised to call back if the symptoms worsen or if the condition fails to improve as  anticipated.  We spent sufficient time to discuss many aspect of care, questions were answered to patient's satisfaction.  A total of 40 minutes was spent on this visit.  With 15 minutes spent reviewing image findings, pathology reports, 20 minutes counseling the patient treatments, side effects of the treatment, management of symptoms.  Additional 5 minutes was spent on answering patient's questions.  Earlie Server, MD, PhD Hematology Oncology [place] at [facility] 08/27/2020

## 2020-08-27 NOTE — Patient Instructions (Signed)
C[facility]ER CENTER [facility]  Discharge Instructions: Thank you for choosing [facility] to provide your oncology and hematology care.  If you have a lab appointment with the Cancer Center, please go directly to the Cancer Center and check in at the registration area.  Wear comfortable clothing and clothing appropriate for easy access to any Portacath or PICC line.   We strive to give you quality time with your provider. You may need to reschedule your appointment if you arrive late (15 or more minutes).  Arriving late affects you and other patients whose appointments are after yours.  Also, if you miss three or more appointments without notifying the office, you may be dismissed from the clinic at the provider's discretion.      For prescription refill requests, have your pharmacy contact our office and allow 72 hours for refills to be completed.    Today you received the following chemotherapy and/or immunotherapy agents       To help prevent nausea and vomiting after your treatment, we encourage you to take your nausea medication as directed.  BELOW ARE SYMPTOMS THAT SHOULD BE REPORTED IMMEDIATELY: *FEVER GREATER THAN 100.4 F (38 C) OR HIGHER *CHILLS OR SWEATING *NAUSEA AND VOMITING THAT IS NOT CONTROLLED WITH YOUR NAUSEA MEDICATION *UNUSUAL SHORTNESS OF BREATH *UNUSUAL BRUISING OR BLEEDING *URINARY PROBLEMS (pain or burning when urinating, or frequent urination) *BOWEL PROBLEMS (unusual diarrhea, constipation, pain near the anus) TENDERNESS IN MOUTH AND THROAT WITH OR WITHOUT PRESENCE OF ULCERS (sore throat, sores in mouth, or a toothache) UNUSUAL RASH, SWELLING OR PAIN  UNUSUAL VAGINAL DISCHARGE OR ITCHING   Items with * indicate a potential emergency and should be followed up as soon as possible or go to the Emergency Department if any problems should occur.  Please show the CHEMOTHERAPY ALERT CARD or IMMUNOTHERAPY ALERT CARD at check-in to the Emergency  Department and triage nurse.  Should you have questions after your visit or need to cancel or reschedule your appointment, please contact [facility]  [phone] and follow the prompts.  Office hours are 8:00 a.m. to 4:30 p.m. Monday - Friday. Please note that voicemails left after 4:00 p.m. may not be returned until the following business day.  We are closed weekends and major holidays. You have access to a nurse at all times for urgent questions. Please call the main number to the clinic [phone] and follow the prompts.  For any non-urgent questions, you may also contact your provider using MyChart. We now offer e-Visits for anyone 18 and older to request care online for non-urgent symptoms. For details visit [email or web address].   Also download the MyChart app! Go to the app store, search "MyChart", open the app, select [facility], and log in with your MyChart username and password.  Due to Covid, a mask is required upon entering the hospital/clinic. If you do not have a mask, one will be given to you upon arrival. For doctor visits, patients may have 1 support person aged 18 or older with them. For treatment visits, patients cannot have anyone with them due to current Covid guidelines and our immunocompromised population.  

## 2020-08-28 ENCOUNTER — Telehealth: Payer: Self-pay

## 2020-08-28 LAB — T4: T4, Total: 6.2 ug/dL (ref 4.5–12.0)

## 2020-08-28 NOTE — Telephone Encounter (Signed)
Patient notified and voiced understanding.

## 2020-08-28 NOTE — Telephone Encounter (Signed)
-----   Message from Earlie Server, MD sent at 08/27/2020  7:16 PM EDT ----- Magnesium is low at 1.6.  Please advise patient to take Slow-Mag.  Prescription sent to pharmacy. Also please let him know that okay to start hepatitis C medication [place]   Keep same appointment.

## 2020-09-17 ENCOUNTER — Other Ambulatory Visit: Payer: Self-pay

## 2020-09-17 ENCOUNTER — Inpatient Hospital Stay (HOSPITAL_BASED_OUTPATIENT_CLINIC_OR_DEPARTMENT_OTHER): Payer: Medicare Other | Admitting: Oncology

## 2020-09-17 ENCOUNTER — Inpatient Hospital Stay: Payer: Medicare Other

## 2020-09-17 ENCOUNTER — Encounter: Payer: Self-pay | Admitting: Oncology

## 2020-09-17 ENCOUNTER — Inpatient Hospital Stay: Payer: Medicare Other | Attending: Oncology

## 2020-09-17 VITALS — BP 138/81 | HR 55 | Temp 97.8°F | Resp 20 | Wt 134.7 lb

## 2020-09-17 DIAGNOSIS — I10 Essential (primary) hypertension: Secondary | ICD-10-CM | POA: Insufficient documentation

## 2020-09-17 DIAGNOSIS — B1920 Unspecified viral hepatitis C without hepatic coma: Secondary | ICD-10-CM | POA: Insufficient documentation

## 2020-09-17 DIAGNOSIS — R531 Weakness: Secondary | ICD-10-CM

## 2020-09-17 DIAGNOSIS — E538 Deficiency of other specified B group vitamins: Secondary | ICD-10-CM | POA: Diagnosis not present

## 2020-09-17 DIAGNOSIS — E222 Syndrome of inappropriate secretion of antidiuretic hormone: Secondary | ICD-10-CM | POA: Insufficient documentation

## 2020-09-17 DIAGNOSIS — I4891 Unspecified atrial fibrillation: Secondary | ICD-10-CM | POA: Diagnosis not present

## 2020-09-17 DIAGNOSIS — Z7901 Long term (current) use of anticoagulants: Secondary | ICD-10-CM | POA: Insufficient documentation

## 2020-09-17 DIAGNOSIS — C3432 Malignant neoplasm of lower lobe, left bronchus or lung: Secondary | ICD-10-CM | POA: Diagnosis not present

## 2020-09-17 DIAGNOSIS — Z5112 Encounter for antineoplastic immunotherapy: Secondary | ICD-10-CM | POA: Diagnosis not present

## 2020-09-17 DIAGNOSIS — Z79899 Other long term (current) drug therapy: Secondary | ICD-10-CM | POA: Insufficient documentation

## 2020-09-17 DIAGNOSIS — R634 Abnormal weight loss: Secondary | ICD-10-CM | POA: Diagnosis not present

## 2020-09-17 DIAGNOSIS — C3490 Malignant neoplasm of unspecified part of unspecified bronchus or lung: Secondary | ICD-10-CM

## 2020-09-17 LAB — CBC WITH DIFFERENTIAL/PLATELET
Abs Immature Granulocytes: 0.1 10*3/uL — ABNORMAL HIGH (ref 0.00–0.07)
Basophils Absolute: 0 10*3/uL (ref 0.0–0.1)
Basophils Relative: 0 %
Eosinophils Absolute: 0 10*3/uL (ref 0.0–0.5)
Eosinophils Relative: 0 %
HCT: 37.6 % — ABNORMAL LOW (ref 39.0–52.0)
Hemoglobin: 13.4 g/dL (ref 13.0–17.0)
Immature Granulocytes: 2 %
Lymphocytes Relative: 34 %
Lymphs Abs: 1.6 10*3/uL (ref 0.7–4.0)
MCH: 34.4 pg — ABNORMAL HIGH (ref 26.0–34.0)
MCHC: 35.6 g/dL (ref 30.0–36.0)
MCV: 96.7 fL (ref 80.0–100.0)
Monocytes Absolute: 0.6 10*3/uL (ref 0.1–1.0)
Monocytes Relative: 12 %
Neutro Abs: 2.3 10*3/uL (ref 1.7–7.7)
Neutrophils Relative %: 52 %
Platelets: 216 10*3/uL (ref 150–400)
RBC: 3.89 MIL/uL — ABNORMAL LOW (ref 4.22–5.81)
RDW: 13.2 % (ref 11.5–15.5)
WBC: 4.5 10*3/uL (ref 4.0–10.5)
nRBC: 0 % (ref 0.0–0.2)

## 2020-09-17 LAB — COMPREHENSIVE METABOLIC PANEL
ALT: 14 U/L (ref 0–44)
AST: 20 U/L (ref 15–41)
Albumin: 4 g/dL (ref 3.5–5.0)
Alkaline Phosphatase: 99 U/L (ref 38–126)
Anion gap: 13 (ref 5–15)
BUN: 11 mg/dL (ref 8–23)
CO2: 25 mmol/L (ref 22–32)
Calcium: 9.3 mg/dL (ref 8.9–10.3)
Chloride: 102 mmol/L (ref 98–111)
Creatinine, Ser: 0.75 mg/dL (ref 0.61–1.24)
GFR, Estimated: >60 mL/min (ref >60)
Glucose, Bld: 112 mg/dL — ABNORMAL HIGH (ref 70–99)
Potassium: 3.9 mmol/L (ref 3.5–5.1)
Sodium: 140 mmol/L (ref 135–145)
Total Bilirubin: 1.1 mg/dL (ref 0.3–1.2)
Total Protein: 7.7 g/dL (ref 6.5–8.1)

## 2020-09-17 NOTE — Progress Notes (Signed)
[facility]  [address] [place], [place] [address] Phone: [phone]  Fax: [phone]   Clinic Day:  09/17/20  Referring physician: No ref. provider found  Chief Complaint: Scott Jordan is a 70 y.o. male with extensive stage small cell lung cancer who is seen for assessment prior to cycle #9 maintenance Tecentriq.  PERTINENT ONCOLOGY HISTORY Scott Jordan is a 70 y.o.[place] who has above oncology history reviewed by me today presented for follow up visit for management of extensive small cell lung cancer Patient previously followed up by Dr.Corcoran, patient switched care to me on 08/27/20 Extensive medical records review was performed by me.  He has a > 40 pack year smoking.  He presented with a recurrent left sided pleural effusion.  He has undergone thoracentesis x 2 in the past month.  He is s/p pigtail drain.  Initial cytology x 2 was negative (lymphocytic exudative effusion).  Pleural fluid cytology on 08/18/2019 confirmed small cell lung cancer.     08/19/2019 Chest CT without contrast on revealed the left pleural pigtail drain.  There was a small residual left hydropneumothorax.  There was a 6.7 x 4 cm medial basilar left lower lobe lung mass suspicious for primary bronchogenic carcinoma.  There was a lingular 1.0 cm solid pulmonary nodule suspicious for ipsilateral metastasis.  There was intralobular septal thickening and patchy groundglass opacity throughout the lingula and the left lower lobe with moderate residual atelectasis of the left lung base.  A component of lymphangitic carcinomatosis was suspected.  There was ipsilateral infra hilar, subcarinal, AP window, prevascular and upper retroperitoneal adenopathy c/w metastatic disease.   05/12/2021Brain MRI on  revealed no evidence of metastatic disease.   08/29/2019 PET scan on  revealed markedly hypermetabolic left lower lobe pulmonary lesion (SUV 7.9) hypermetabolic nodal metastases in  the mediastinum and left hilum with metastatic involvement of left pleural space. Additional sites included upper abdominal hypermetabolic lymphadenopathy and scattered hypermetabolic bone metastases (left acetabulum, sacrum, and L5). There was interval decrease in left pleural effusion with areas of loculation.  CEA was 3.7 on 09/02/2019, 3.8 on 02/17/2020 and 3.1 on 05/13/2020.  LDH was 206 on 09/23/2019, 144 on 10/16/2019 and 102 on 05/13/2020.   09/02/2019- 11/11/2019 He is s/p 4 cycles of carboplatin, etoposide, and Tecentriq with Udencya support.   10/11/2019: CT chest abdomen pelvis showed reduction of chest and abdominal lymphadenopathy.  Pleural nodularity improved. 11/22/2019 - 11/25/2019 admission [facility] with a heroin overdose and aspiration pneumonia.  He received 1 unit of PRBCs.  He was treated with Zosyn.  He was discharged on Augmentin x5 days. 12/02/2019 - 02/17/2020  maintenance Tecentriq 04/22/2020 - present  maintenance Tecentriq 01/09/2020 - 01/22/2020 He received PCI  03/10/2020: Brain MRI no metastasis 03/10/2020  Cervical spine MRI revealed no evidence of metastatic disease to the cervical spine.  Multilevel DJD 03/19/2020:  PET scan mild patchy left lower lobe opacity, stable versus mildly improved 06/24/2020 Head CT without contrast: No mets 06/23/2020: CT chest abdomen pelvis: new 0.6 x 0.4 cm pleural-based nodule in the left upper lobe  06/24/2020: Head CT without contrast no metastasis.   Other chronic medical problems.  hyponatremia secondary to SIADH.  He began sodium chloride tablets (1 gm TID) on 09/04/2019.   B12 deficiency.  B12 was 227 on 03/23/2020 and 646 on 05/13/2020.   He is on oral B12.  Folate was 19.2 on 04/22/2020.   left upper extremity symptoms c/w a left-sided C6 radiculopathy as  well as ulnar neuropathy.   Hepatitis C antibody and hepatitis B core antibody total were positive on 12/23/2019.  Hepatitis B surface antigen, hepatitis B core IgM and  hepatitis A IgM were negative.  Hepatitis B DNA was not detected. Hepatitis C RNA quantitative revealed 3.869 log 10 IU/mL (7400 IU/mL).  Hepatitis C genotype is 2b. He sees gastroenterology  He has a significant family history of malignancy.  A family member has had genetic testing (negative for Lynch syndrome).  INTERVAL HISTORY Scott Jordan is a 70 y.o. male who has above history who presents for follow up visit for treatment for his extensive small cell lung cancer.  He is on maintenance Tecentriq.  His last treatment was on 08/27/2020.  Overall he feels worn down.  He feels like he is not getting around as well as he did before.  He tires quickly.  He continues to have left hand discomfort.  His appetite is fair.  He is asking for some ensures.   Past Medical History:  Diagnosis Date   A-fib ([place]) 01/10/2019   pt st this was dx by Dr. Ubaldo Glassing   Cancer [facility]) [id number]   LUNG   Complication of anesthesia    DIFFICULTY WAKING UP AFTER SURGERY- 20 YRS AGO   Hypertension    Lung cancer ([place])    Substance abuse ([place])     Past Surgical History:  Procedure Laterality Date   ARM DEBRIDEMENT Left    INCISION AND DEBRIDEMENT LOWER ARM -20 YRS AGO   BACK SURGERY     PORTACATH PLACEMENT Left 08/29/2019   Procedure: INSERTION PORT-A-CATH;  Surgeon: Nestor Lewandowsky, MD;  Location: [facility] ORS;  Service: General;  Laterality: Left;    History reviewed. No pertinent family history.  Social History:  reports that he has quit smoking. His smoking use included cigarettes. He smoked an average of 0.50 packs per day. He has never used smokeless tobacco. He reports current alcohol use. He reports previous drug use. Drug: Heroin. The patient denies any exposure to radiation or toxins.  The patient lives in [place]. He is dependent on public transportation or transportation from friends/family. He has family friends staying with him to help care for his needs. His niece, [name] [phone]), is his medical  power of attorney. The patient is alone today.   Allergies: No Known Allergies  Current Medications: Current Outpatient Medications  Medication Sig Dispense Refill   calcium carbonate (OS-CAL) 600 MG TABS tablet Take 600 mg by mouth daily with breakfast.     ELIQUIS 5 MG TABS tablet Take 5 mg by mouth 2 (two) times daily.     feeding supplement, ENSURE ENLIVE, (ENSURE ENLIVE) LIQD Take 237 mLs by mouth 3 (three) times daily between meals. 237 mL 12   lidocaine-prilocaine (EMLA) cream Apply to affected area once 30 g 3   magnesium chloride (SLOW-MAG) 64 MG TBEC SR tablet Take 1 tablet (64 mg total) by mouth daily. 30 tablet 1   MAVYRET 100-40 MG TABS Take 3 tablets by mouth daily.     mirtazapine (REMERON) 15 MG tablet Take by mouth.     potassium chloride SA (KLOR-CON) 20 MEQ tablet TAKE 1 TABLET BY MOUTH DAILY FOR 3 DAYS 3 tablet 0   sodium chloride 1 g tablet Take 1 tablet (1 g total) by mouth 3 (three) times daily with meals. 90 tablet 1   cholecalciferol (VITAMIN D3) 25 MCG (1000 UNIT) tablet Take 1,000 Units by mouth daily. (Patient not taking:  No sig reported)     Cyanocobalamin (VITAMIN B 12) 500 MCG TABS Take by mouth. (Patient not taking: No sig reported)     diltiazem (CARDIZEM) 30 MG tablet Take by mouth. (Patient not taking: No sig reported)     diltiazem (TIAZAC) 240 MG 24 hr capsule Take 1 capsule by mouth daily. (Patient not taking: No sig reported)     naloxone (NARCAN) nasal spray 4 mg/0.1 mL once as needed (Patient not taking: No sig reported)     ondansetron (ZOFRAN) 8 MG tablet Take 1 tablet (8 mg total) by mouth every 8 (eight) hours as needed for refractory nausea / vomiting. Start on day 3 after carboplatin chemo. (Patient not taking: No sig reported) 30 tablet 1   No current facility-administered medications for this visit.   Facility-Administered Medications Ordered in Other Visits  Medication Dose Route Frequency Provider Last Rate Last Admin   heparin lock flush  100 unit/mL  500 Units Intravenous Once Corcoran, Melissa C, MD       sodium chloride flush (NS) 0.9 % injection 10 mL  10 mL Intravenous PRN Lequita Asal, MD   10 mL at 11/11/19 5284    Review of Systems  Constitutional:  Positive for malaise/fatigue and weight loss. Negative for chills and fever.  HENT:  Negative for congestion, ear pain and tinnitus.   Eyes: Negative.  Negative for blurred vision and double vision.  Respiratory:  Positive for cough and shortness of breath. Negative for sputum production.   Cardiovascular: Negative.  Negative for chest pain, palpitations and leg swelling.  Gastrointestinal: Negative.  Negative for abdominal pain, constipation, diarrhea, nausea and vomiting.  Genitourinary:  Negative for dysuria, frequency and urgency.  Musculoskeletal:  Positive for joint pain (Left hand). Negative for back pain and falls.  Skin: Negative.  Negative for rash.  Neurological:  Positive for weakness. Negative for headaches.  Endo/Heme/Allergies: Negative.  Does not bruise/bleed easily.  Psychiatric/Behavioral: Negative.  Negative for depression. The patient is not nervous/anxious and does not have insomnia.   Performance status (ECOG): 1  Vitals Blood pressure 138/81, pulse (!) 55, temperature 97.8 F (36.6 C), resp. rate 20, weight 134 lb 11.2 oz (61.1 kg), SpO2 100 %.   Physical Exam Constitutional:      General: He is not in acute distress. HENT:     Head: Normocephalic and atraumatic.  Eyes:     General: No scleral icterus. Cardiovascular:     Rate and Rhythm: Normal rate and regular rhythm.     Heart sounds: Normal heart sounds.  Pulmonary:     Effort: Pulmonary effort is normal. No respiratory distress.     Breath sounds: No wheezing.     Comments: Bilateral decreased breath sound Abdominal:     General: Bowel sounds are normal. There is no distension.     Palpations: Abdomen is soft.  Musculoskeletal:        General: No deformity. Normal range of  motion.     Cervical back: Normal range of motion and neck supple.  Skin:    General: Skin is warm and dry.     Findings: No erythema or rash.  Neurological:     Mental Status: He is alert and oriented to person, place, and time. Mental status is at baseline.     Cranial Nerves: No cranial nerve deficit.     Coordination: Coordination normal.  Psychiatric:        Mood and Affect: Mood normal.  Imaging studies:  Appointment on 09/17/2020  Component Date Value Ref Range Status   Sodium 09/17/2020 140  135 - 145 mmol/L Final   Potassium 09/17/2020 3.9  3.5 - 5.1 mmol/L Final   Chloride 09/17/2020 102  98 - 111 mmol/L Final   CO2 09/17/2020 25  22 - 32 mmol/L Final   Glucose, Bld 09/17/2020 112 (A) 70 - 99 mg/dL Final   Glucose reference range applies only to samples taken after fasting for at least 8 hours.   BUN 09/17/2020 11  8 - 23 mg/dL Final   Creatinine, Ser 09/17/2020 0.75  0.61 - 1.24 mg/dL Final   Calcium 09/17/2020 9.3  8.9 - 10.3 mg/dL Final   Total Protein 09/17/2020 7.7  6.5 - 8.1 g/dL Final   Albumin 09/17/2020 4.0  3.5 - 5.0 g/dL Final   AST 09/17/2020 20  15 - 41 U/L Final   ALT 09/17/2020 14  0 - 44 U/L Final   Alkaline Phosphatase 09/17/2020 99  38 - 126 U/L Final   Total Bilirubin 09/17/2020 1.1  0.3 - 1.2 mg/dL Final   GFR, Estimated 09/17/2020 >60  >60 mL/min Final   Comment: (NOTE) Calculated using the CKD-EPI Creatinine Equation (2021)    Anion gap 09/17/2020 13  5 - 15 Final   Performed at [facility], [place], [place] [address]   WBC 09/17/2020 4.5  4.0 - 10.5 K/uL Final   RBC 09/17/2020 3.89 (A) 4.22 - 5.81 MIL/uL Final   Hemoglobin 09/17/2020 13.4  13.0 - 17.0 g/dL Final   HCT 09/17/2020 37.6 (A) 39.0 - 52.0 % Final   MCV 09/17/2020 96.7  80.0 - 100.0 fL Final   MCH 09/17/2020 34.4 (A) 26.0 - 34.0 pg Final   MCHC 09/17/2020 35.6  30.0 - 36.0 g/dL Final   RDW 09/17/2020 13.2  11.5 - 15.5 % Final   Platelets 09/17/2020 216   150 - 400 K/uL Final   nRBC 09/17/2020 0.0  0.0 - 0.2 % Final   Neutrophils Relative % 09/17/2020 52  % Final   Neutro Abs 09/17/2020 2.3  1.7 - 7.7 K/uL Final   Lymphocytes Relative 09/17/2020 34  % Final   Lymphs Abs 09/17/2020 1.6  0.7 - 4.0 K/uL Final   Monocytes Relative 09/17/2020 12  % Final   Monocytes Absolute 09/17/2020 0.6  0.1 - 1.0 K/uL Final   Eosinophils Relative 09/17/2020 0  % Final   Eosinophils Absolute 09/17/2020 0.0  0.0 - 0.5 K/uL Final   Basophils Relative 09/17/2020 0  % Final   Basophils Absolute 09/17/2020 0.0  0.0 - 0.1 K/uL Final   Immature Granulocytes 09/17/2020 2  % Final   Abs Immature Granulocytes 09/17/2020 0.10 (A) 0.00 - 0.07 K/uL Final   Performed at [facility], [address] [place], [place] [address]    Assessment:  Scott Jordan is a 70 y.o. male with extensive stage small cell lung cancer.   No diagnosis found.  Extensive small cell lung cancer S/p 4 cycles of carboplatin, etoposide and Tecentriq Currently on Tecentriq maintenance. Most recent CT scan showed stable disease, except 0.6x 0.4cm pleural based nodule left upper lobe- ?benign/inflammatory. Repeat CT in June 2022 Labs from today are stable.   Proceed with Tecentriq-due to scheduling error his Gildardo Pounds will be moved to 09/22/2020 in [place].  Uncontrolled hypertension Patient has not been compliant with blood pressure medication. Recommend patient to resume his BP meds and monitor blood pressure at  home.   Continue follow-up with cardiology.  History of atrial fibrillation/flutter:  Continue Eliquis 5 mg twice daily.  Left C6 radiculopathy and ulnar neuropathy likely secondary to nerve entrapment at the elbow. Right MRI on 03/10/2020 revealed no intracranial metastasis.   Cervical spine MRI confirmed no metastasis, severe DJD, bilateral neuroforaminal narrowing. Continue follow-up with Dr. Donneta Romberg and Dr. Cari Caraway.             B12 deficiency B12 was 227 on  03/23/2020 and 646 on 05/13/2020.   Continue oral vitamin B12  Hypomagnesia Continue Slow-Mag  Hepatitis C: Will begin Mavyret    Weight loss: His weight has been fluctuating 130-135lbs. He is asking for additional ensures today. Will place referral for our dietitian.   Disposition: Unfortunately due to scheduling error he will have his next Tecentriq on Tuesday, 09/22/2020. He will return to clinic 3 weeks later on a Thursday in [place] for labs and maintenance Tecentriq (7/7). Referral sent for dietitian consult.  Greater than 50% was spent in counseling and coordination of care with this patient including but not limited to discussion of the relevant topics above (See A&P) including, but not limited to diagnosis and management of acute and chronic medical conditions.   Faythe Casa, NP 09/17/2020 3:36 PM

## 2020-09-17 NOTE — Progress Notes (Signed)
Patient states he is weak in the legs. Patient states is light headed today.

## 2020-09-22 ENCOUNTER — Other Ambulatory Visit: Payer: Self-pay

## 2020-09-22 ENCOUNTER — Inpatient Hospital Stay: Payer: Medicare Other

## 2020-09-22 VITALS — BP 131/87 | HR 72 | Temp 96.8°F | Resp 18

## 2020-09-22 DIAGNOSIS — C3432 Malignant neoplasm of lower lobe, left bronchus or lung: Secondary | ICD-10-CM | POA: Diagnosis not present

## 2020-09-22 DIAGNOSIS — R634 Abnormal weight loss: Secondary | ICD-10-CM | POA: Diagnosis not present

## 2020-09-22 DIAGNOSIS — E538 Deficiency of other specified B group vitamins: Secondary | ICD-10-CM | POA: Diagnosis not present

## 2020-09-22 DIAGNOSIS — Z5112 Encounter for antineoplastic immunotherapy: Secondary | ICD-10-CM | POA: Diagnosis not present

## 2020-09-22 DIAGNOSIS — C3490 Malignant neoplasm of unspecified part of unspecified bronchus or lung: Secondary | ICD-10-CM

## 2020-09-22 DIAGNOSIS — Z7901 Long term (current) use of anticoagulants: Secondary | ICD-10-CM | POA: Diagnosis not present

## 2020-09-22 DIAGNOSIS — Z79899 Other long term (current) drug therapy: Secondary | ICD-10-CM | POA: Diagnosis not present

## 2020-09-22 DIAGNOSIS — E222 Syndrome of inappropriate secretion of antidiuretic hormone: Secondary | ICD-10-CM | POA: Diagnosis not present

## 2020-09-22 DIAGNOSIS — I10 Essential (primary) hypertension: Secondary | ICD-10-CM | POA: Diagnosis not present

## 2020-09-22 DIAGNOSIS — I4891 Unspecified atrial fibrillation: Secondary | ICD-10-CM | POA: Diagnosis not present

## 2020-09-22 MED ORDER — SODIUM CHLORIDE 0.9 % IV SOLN
1200.0000 mg | Freq: Once | INTRAVENOUS | Status: DC
  Filled 2020-09-22: qty 20

## 2020-09-22 MED ORDER — HEPARIN SOD (PORK) LOCK FLUSH 100 UNIT/ML IV SOLN
500.0000 [IU] | Freq: Once | INTRAVENOUS | Status: AC | PRN
  Administered 2020-09-22: 500 [IU]
  Filled 2020-09-22: qty 5

## 2020-09-22 MED ORDER — SODIUM CHLORIDE 0.9 % IV SOLN
Freq: Once | INTRAVENOUS | Status: AC
  Filled 2020-09-22: qty 250

## 2020-09-22 MED ORDER — SODIUM CHLORIDE 0.9 % IV SOLN
1200.0000 mg | Freq: Once | INTRAVENOUS | Status: AC
Start: 2020-09-22 — End: 2020-09-22
  Administered 2020-09-22: 1200 mg via INTRAVENOUS
  Filled 2020-09-22: qty 20

## 2020-09-22 NOTE — Patient Instructions (Signed)
[facility]  Discharge Instructions: Thank you for choosing [facility] to provide your oncology and hematology care.  If you have a lab appointment with the Cancer Center, please go directly to the Cancer Center and check in at the registration area.  Wear comfortable clothing and clothing appropriate for easy access to any Portacath or PICC line.   We strive to give you quality time with your provider. You may need to reschedule your appointment if you arrive late (15 or more minutes).  Arriving late affects you and other patients whose appointments are after yours.  Also, if you miss three or more appointments without notifying the office, you may be dismissed from the clinic at the provider's discretion.      For prescription refill requests, have your pharmacy contact our office and allow 72 hours for refills to be completed.    Today you received the following chemotherapy and/or immunotherapy agents       To help prevent nausea and vomiting after your treatment, we encourage you to take your nausea medication as directed.  BELOW ARE SYMPTOMS THAT SHOULD BE REPORTED IMMEDIATELY: *FEVER GREATER THAN 100.4 F (38 C) OR HIGHER *CHILLS OR SWEATING *NAUSEA AND VOMITING THAT IS NOT CONTROLLED WITH YOUR NAUSEA MEDICATION *UNUSUAL SHORTNESS OF BREATH *UNUSUAL BRUISING OR BLEEDING *URINARY PROBLEMS (pain or burning when urinating, or frequent urination) *BOWEL PROBLEMS (unusual diarrhea, constipation, pain near the anus) TENDERNESS IN MOUTH AND THROAT WITH OR WITHOUT PRESENCE OF ULCERS (sore throat, sores in mouth, or a toothache) UNUSUAL RASH, SWELLING OR PAIN  UNUSUAL VAGINAL DISCHARGE OR ITCHING   Items with * indicate a potential emergency and should be followed up as soon as possible or go to the Emergency Department if any problems should occur.  Please show the CHEMOTHERAPY ALERT CARD or IMMUNOTHERAPY ALERT CARD at check-in to the Emergency  Department and triage nurse.  Should you have questions after your visit or need to cancel or reschedule your appointment, please contact [facility]  [phone] and follow the prompts.  Office hours are 8:00 a.m. to 4:30 p.m. Monday - Friday. Please note that voicemails left after 4:00 p.m. may not be returned until the following business day.  We are closed weekends and major holidays. You have access to a nurse at all times for urgent questions. Please call the main number to the clinic [phone] and follow the prompts.  For any non-urgent questions, you may also contact your provider using MyChart. We now offer e-Visits for anyone 18 and older to request care online for non-urgent symptoms. For details visit [email or web address].   Also download the MyChart app! Go to the app store, search "MyChart", open the app, select [facility], and log in with your MyChart username and password.  Due to Covid, a mask is required upon entering the hospital/clinic. If you do not have a mask, one will be given to you upon arrival. For doctor visits, patients may have 1 support person aged 18 or older with them. For treatment visits, patients cannot have anyone with them due to current Covid guidelines and our immunocompromised population.  

## 2020-10-01 ENCOUNTER — Telehealth: Payer: Self-pay

## 2020-10-01 NOTE — Telephone Encounter (Signed)
[facility] faxed stating that they have made numerous phone calls to the number provided trying to schedule a delivery for patient. They said if we had any additional numbers or alternate forms of contacting them to please contact [facility] so they can assist with scheduling the next delivery.   Phone [phone]

## 2020-10-05 NOTE — Telephone Encounter (Signed)
Received message back for specialty pharmacy and they have reached the patient and delivery has been scheduled.

## 2020-10-14 ENCOUNTER — Other Ambulatory Visit: Payer: Self-pay | Admitting: *Deleted

## 2020-10-14 DIAGNOSIS — C3490 Malignant neoplasm of unspecified part of unspecified bronchus or lung: Secondary | ICD-10-CM

## 2020-10-15 ENCOUNTER — Inpatient Hospital Stay: Payer: Medicare Other | Attending: Oncology

## 2020-10-15 ENCOUNTER — Inpatient Hospital Stay: Payer: Medicare Other

## 2020-10-15 ENCOUNTER — Other Ambulatory Visit: Payer: Self-pay

## 2020-10-15 ENCOUNTER — Inpatient Hospital Stay (HOSPITAL_BASED_OUTPATIENT_CLINIC_OR_DEPARTMENT_OTHER): Payer: Medicare Other | Admitting: Oncology

## 2020-10-15 ENCOUNTER — Encounter: Payer: Self-pay | Admitting: Oncology

## 2020-10-15 VITALS — BP 137/83 | HR 80 | Temp 97.0°F | Resp 19 | Wt 132.9 lb

## 2020-10-15 DIAGNOSIS — Z5112 Encounter for antineoplastic immunotherapy: Secondary | ICD-10-CM

## 2020-10-15 DIAGNOSIS — C781 Secondary malignant neoplasm of mediastinum: Secondary | ICD-10-CM | POA: Diagnosis not present

## 2020-10-15 DIAGNOSIS — R868 Other abnormal findings in specimens from male genital organs: Secondary | ICD-10-CM

## 2020-10-15 DIAGNOSIS — I10 Essential (primary) hypertension: Secondary | ICD-10-CM | POA: Diagnosis not present

## 2020-10-15 DIAGNOSIS — E222 Syndrome of inappropriate secretion of antidiuretic hormone: Secondary | ICD-10-CM | POA: Insufficient documentation

## 2020-10-15 DIAGNOSIS — B1920 Unspecified viral hepatitis C without hepatic coma: Secondary | ICD-10-CM | POA: Insufficient documentation

## 2020-10-15 DIAGNOSIS — E538 Deficiency of other specified B group vitamins: Secondary | ICD-10-CM | POA: Insufficient documentation

## 2020-10-15 DIAGNOSIS — B182 Chronic viral hepatitis C: Secondary | ICD-10-CM | POA: Diagnosis not present

## 2020-10-15 DIAGNOSIS — I4891 Unspecified atrial fibrillation: Secondary | ICD-10-CM | POA: Diagnosis not present

## 2020-10-15 DIAGNOSIS — C3490 Malignant neoplasm of unspecified part of unspecified bronchus or lung: Secondary | ICD-10-CM | POA: Diagnosis not present

## 2020-10-15 DIAGNOSIS — C7951 Secondary malignant neoplasm of bone: Secondary | ICD-10-CM | POA: Insufficient documentation

## 2020-10-15 DIAGNOSIS — C3432 Malignant neoplasm of lower lobe, left bronchus or lung: Secondary | ICD-10-CM | POA: Insufficient documentation

## 2020-10-15 DIAGNOSIS — Z79899 Other long term (current) drug therapy: Secondary | ICD-10-CM | POA: Diagnosis not present

## 2020-10-15 DIAGNOSIS — Z7901 Long term (current) use of anticoagulants: Secondary | ICD-10-CM | POA: Diagnosis not present

## 2020-10-15 DIAGNOSIS — F17210 Nicotine dependence, cigarettes, uncomplicated: Secondary | ICD-10-CM | POA: Insufficient documentation

## 2020-10-15 LAB — CBC WITH DIFFERENTIAL/PLATELET
Abs Immature Granulocytes: 0.07 10*3/uL (ref 0.00–0.07)
Basophils Absolute: 0 10*3/uL (ref 0.0–0.1)
Basophils Relative: 0 %
Eosinophils Absolute: 0 10*3/uL (ref 0.0–0.5)
Eosinophils Relative: 0 %
HCT: 37.7 % — ABNORMAL LOW (ref 39.0–52.0)
Hemoglobin: 13.7 g/dL (ref 13.0–17.0)
Immature Granulocytes: 1 %
Lymphocytes Relative: 21 %
Lymphs Abs: 1.3 10*3/uL (ref 0.7–4.0)
MCH: 34.5 pg — ABNORMAL HIGH (ref 26.0–34.0)
MCHC: 36.3 g/dL — ABNORMAL HIGH (ref 30.0–36.0)
MCV: 95 fL (ref 80.0–100.0)
Monocytes Absolute: 0.6 10*3/uL (ref 0.1–1.0)
Monocytes Relative: 10 %
Neutro Abs: 4.1 10*3/uL (ref 1.7–7.7)
Neutrophils Relative %: 68 %
Platelets: 194 10*3/uL (ref 150–400)
RBC: 3.97 MIL/uL — ABNORMAL LOW (ref 4.22–5.81)
RDW: 12.6 % (ref 11.5–15.5)
WBC: 6.1 10*3/uL (ref 4.0–10.5)
nRBC: 0 % (ref 0.0–0.2)

## 2020-10-15 LAB — COMPREHENSIVE METABOLIC PANEL
ALT: 15 U/L (ref 0–44)
AST: 19 U/L (ref 15–41)
Albumin: 3.9 g/dL (ref 3.5–5.0)
Alkaline Phosphatase: 115 U/L (ref 38–126)
Anion gap: 7 (ref 5–15)
BUN: 16 mg/dL (ref 8–23)
CO2: 25 mmol/L (ref 22–32)
Calcium: 9.4 mg/dL (ref 8.9–10.3)
Chloride: 104 mmol/L (ref 98–111)
Creatinine, Ser: 0.88 mg/dL (ref 0.61–1.24)
GFR, Estimated: >60 mL/min (ref >60)
Glucose, Bld: 113 mg/dL — ABNORMAL HIGH (ref 70–99)
Potassium: 3.5 mmol/L (ref 3.5–5.1)
Sodium: 136 mmol/L (ref 135–145)
Total Bilirubin: 3.1 mg/dL — ABNORMAL HIGH (ref 0.3–1.2)
Total Protein: 7.6 g/dL (ref 6.5–8.1)

## 2020-10-15 MED ORDER — SODIUM CHLORIDE 0.9 % IV SOLN
1200.0000 mg | Freq: Once | INTRAVENOUS | Status: AC
  Administered 2020-10-15: 1200 mg via INTRAVENOUS
  Filled 2020-10-15: qty 20

## 2020-10-15 MED ORDER — SODIUM CHLORIDE 0.9 % IV SOLN
Freq: Once | INTRAVENOUS | Status: AC
  Filled 2020-10-15: qty 250

## 2020-10-15 MED ORDER — HEPARIN SOD (PORK) LOCK FLUSH 100 UNIT/ML IV SOLN
500.0000 [IU] | Freq: Once | INTRAVENOUS | Status: AC | PRN
  Administered 2020-10-15: 500 [IU]
  Filled 2020-10-15: qty 5

## 2020-10-15 NOTE — Patient Instructions (Signed)
[facility]  Discharge Instructions: Thank you for choosing [facility] to provide your oncology and hematology care.  If you have a lab appointment with the [facility], please go directly to the [facility] and check in at the registration area.  Wear comfortable clothing and clothing appropriate for easy access to any Portacath or PICC line.   We strive to give you quality time with your provider. You may need to reschedule your appointment if you arrive late (15 or more minutes).  Arriving late affects you and other patients whose appointments are after yours.  Also, if you miss three or more appointments without notifying the office, you may be dismissed from the clinic at the provider's discretion.      For prescription refill requests, have your pharmacy contact our office and allow 72 hours for refills to be completed.    Today you received the following chemotherapy and/or immunotherapy agents: Tecentriq      To help prevent nausea and vomiting after your treatment, we encourage you to take your nausea medication as directed.  BELOW ARE SYMPTOMS THAT SHOULD BE REPORTED IMMEDIATELY: *FEVER GREATER THAN 100.4 F (38 C) OR HIGHER *CHILLS OR SWEATING *NAUSEA AND VOMITING THAT IS NOT CONTROLLED WITH YOUR NAUSEA MEDICATION *UNUSUAL SHORTNESS OF BREATH *UNUSUAL BRUISING OR BLEEDING *URINARY PROBLEMS (pain or burning when urinating, or frequent urination) *BOWEL PROBLEMS (unusual diarrhea, constipation, pain near the anus) TENDERNESS IN MOUTH AND THROAT WITH OR WITHOUT PRESENCE OF ULCERS (sore throat, sores in mouth, or a toothache) UNUSUAL RASH, SWELLING OR PAIN  UNUSUAL VAGINAL DISCHARGE OR ITCHING   Items with * indicate a potential emergency and should be followed up as soon as possible or go to the Emergency Department if any problems should occur.  Please show the CHEMOTHERAPY ALERT CARD or IMMUNOTHERAPY ALERT CARD at check-in to the  Emergency Department and triage nurse.  Should you have questions after your visit or need to cancel or reschedule your appointment, please contact [facility]  [phone] and follow the prompts.  Office hours are 8:00 a.m. to 4:30 p.m. Monday - Friday. Please note that voicemails left after 4:00 p.m. may not be returned until the following business day.  We are closed weekends and major holidays. You have access to a nurse at all times for urgent questions. Please call the main number to the clinic [phone] and follow the prompts.  For any non-urgent questions, you may also contact your provider using MyChart. We now offer e-Visits for anyone 65 and older to request care online for non-urgent symptoms. For details visit [email or web address].   Also download the MyChart app! Go to the app store, search "MyChart", open the app, select [facility], and log in with your MyChart username and password.  Due to Covid, a mask is required upon entering the hospital/clinic. If you do not have a mask, one will be given to you upon arrival. For doctor visits, patients may have 1 support person aged 54 or older with them. For treatment visits, patients cannot have anyone with them due to current Covid guidelines and our immunocompromised population.

## 2020-10-15 NOTE — Progress Notes (Signed)
Oncology follow up. No new complaints today. Eyes have been burning.

## 2020-10-15 NOTE — Progress Notes (Signed)
[facility]  [address] [place], [place] [address] Phone: [phone]  Fax: [phone]   Clinic Day:  10/15/20  Referring physician: No ref. provider found  Chief Complaint: Scott Jordan is a 70 y.o. male with extensive stage small cell lung cancer who is seen for assessment prior to maintenance Tecentriq.  PERTINENT ONCOLOGY HISTORY Scott Jordan is a 70 y.o.[place] who has above oncology history reviewed by me today presented for follow up visit for management of extensive small cell lung cancer Patient previously followed up by Dr.Corcoran, patient switched care to me on 08/27/20 Extensive medical records review was performed by me.  He has a > 40 pack year smoking.  He presented with a recurrent left sided pleural effusion.  He has undergone thoracentesis x 2 in the past month.  He is s/p pigtail drain.  Initial cytology x 2 was negative (lymphocytic exudative effusion).  Pleural fluid cytology on 08/18/2019 confirmed small cell lung cancer.     08/19/2019 Chest CT without contrast on revealed the left pleural pigtail drain.  There was a small residual left hydropneumothorax.  There was a 6.7 x 4 cm medial basilar left lower lobe lung mass suspicious for primary bronchogenic carcinoma.  There was a lingular 1.0 cm solid pulmonary nodule suspicious for ipsilateral metastasis.  There was intralobular septal thickening and patchy groundglass opacity throughout the lingula and the left lower lobe with moderate residual atelectasis of the left lung base.  A component of lymphangitic carcinomatosis was suspected.  There was ipsilateral infra hilar, subcarinal, AP window, prevascular and upper retroperitoneal adenopathy c/w metastatic disease.   05/12/2021Brain MRI on  revealed no evidence of metastatic disease.   08/29/2019 PET scan on  revealed markedly hypermetabolic left lower lobe pulmonary lesion (SUV 7.9) hypermetabolic nodal metastases in the  mediastinum and left hilum with metastatic involvement of left pleural space. Additional sites included upper abdominal hypermetabolic lymphadenopathy and scattered hypermetabolic bone metastases (left acetabulum, sacrum, and L5). There was interval decrease in left pleural effusion with areas of loculation.  CEA was 3.7 on 09/02/2019, 3.8 on 02/17/2020 and 3.1 on 05/13/2020.  LDH was 206 on 09/23/2019, 144 on 10/16/2019 and 102 on 05/13/2020.   09/02/2019- 11/11/2019 He is s/p 4 cycles of carboplatin, etoposide, and Tecentriq with Udencya support.   10/11/2019: CT chest abdomen pelvis showed reduction of chest and abdominal lymphadenopathy.  Pleural nodularity improved. 11/22/2019 - 11/25/2019 admission [facility] with a heroin overdose and aspiration pneumonia.  He received 1 unit of PRBCs.  He was treated with Zosyn.  He was discharged on Augmentin x5 days. 12/02/2019 - 02/17/2020  maintenance Tecentriq 04/22/2020 - present  maintenance Tecentriq 01/09/2020 - 01/22/2020 He received PCI  03/10/2020: Brain MRI no metastasis 03/10/2020  Cervical spine MRI revealed no evidence of metastatic disease to the cervical spine.  Multilevel DJD 03/19/2020:  PET scan mild patchy left lower lobe opacity, stable versus mildly improved 06/24/2020 Head CT without contrast: No mets 06/23/2020: CT chest abdomen pelvis: new 0.6 x 0.4 cm pleural-based nodule in the left upper lobe  06/24/2020: Head CT without contrast no metastasis.   Other chronic medical problems.  hyponatremia secondary to SIADH.  He began sodium chloride tablets (1 gm TID) on 09/04/2019.   B12 deficiency.  B12 was 227 on 03/23/2020 and 646 on 05/13/2020.   He is on oral B12.  Folate was 19.2 on 04/22/2020.   left upper extremity symptoms c/w a left-sided C6 radiculopathy as well as  ulnar neuropathy.   Hepatitis C antibody and hepatitis B core antibody total were positive on 12/23/2019.  Hepatitis B surface antigen, hepatitis B core IgM and  hepatitis A IgM were negative.  Hepatitis B DNA was not detected. Hepatitis C RNA quantitative revealed 3.869 log 10 IU/mL (7400 IU/mL).  Hepatitis C genotype is 2b. He sees gastroenterology  He has a significant family history of malignancy.  A family member has had genetic testing (negative for Lynch syndrome).  # Hepatitis C, he follows up with gastroenterology and was recommended to start [place] for hep c treatment.   # Hypertension: follows up with Dr. Ubaldo Glassing and was recently seen and was recommended to continue blood pressure meds.  #  Left C6 radiculopathy and ulnar neuropathy likely secondary to nerve entrapment at the elbow. Right MRI on 03/10/2020 revealed no intracranial metastasis.  Cervical spine MRI confirmed no metastasis, severe DJD, bilateral neuroforaminal narrowing. And continue follow-up with Dr. Donneta Romberg and Dr. Cari Caraway.  INTERVAL HISTORY Scott Jordan is a 70 y.o. male who has above history reviewed by me today presents for follow up visit for treatment for his extensive small cell lung cancer. Problems and complaints are listed below: Patient has been on maintenance Tecentriq.   Denies nausea vomiting diarrhea, fever, shortness of breath, chest pain, diarrhea He has noticed "dark color sperm" when he relieves himself with a towel.  No fever, chills, dysuria, abdominal or flank pain.     Past Medical History:  Diagnosis Date   A-fib ([place]) 01/10/2019   pt st this was dx by Dr. Ubaldo Glassing   Cancer [facility]) 1607   LUNG   Complication of anesthesia    DIFFICULTY WAKING UP AFTER SURGERY- 20 YRS AGO   Hypertension    Lung cancer ([place])    Substance abuse ([place])     Past Surgical History:  Procedure Laterality Date   ARM DEBRIDEMENT Left    INCISION AND DEBRIDEMENT LOWER ARM -20 YRS AGO   BACK SURGERY     PORTACATH PLACEMENT Left 08/29/2019   Procedure: INSERTION PORT-A-CATH;  Surgeon: Nestor Lewandowsky, MD;  Location: [facility] ORS;  Service: General;  Laterality: Left;     History reviewed. No pertinent family history.  Social History:  reports that he has quit smoking. His smoking use included cigarettes. He smoked an average of 0.50 packs per day. He has never used smokeless tobacco. He reports current alcohol use. He reports previous drug use. Drug: Heroin. The patient denies any exposure to radiation or toxins.  The patient lives in [place]. He is dependent on public transportation or transportation from friends/family. He has family friends staying with him to help care for his needs. His niece, [name] [phone]), is his medical power of attorney. The patient is alone today.   Allergies: No Known Allergies  Current Medications: Current Outpatient Medications  Medication Sig Dispense Refill   diltiazem (CARDIZEM) 30 MG tablet Take by mouth.     diltiazem (TIAZAC) 240 MG 24 hr capsule Take 1 capsule by mouth daily.     ELIQUIS 5 MG TABS tablet Take 5 mg by mouth 2 (two) times daily.     feeding supplement, ENSURE ENLIVE, (ENSURE ENLIVE) LIQD Take 237 mLs by mouth 3 (three) times daily between meals. 237 mL 12   lidocaine-prilocaine (EMLA) cream Apply to affected area once 30 g 3   MAVYRET 100-40 MG TABS Take 3 tablets by mouth daily.     potassium chloride SA (KLOR-CON) 20 MEQ tablet TAKE  1 TABLET BY MOUTH DAILY FOR 3 DAYS 3 tablet 0   calcium carbonate (OS-CAL) 600 MG TABS tablet Take 600 mg by mouth daily with breakfast. (Patient not taking: Reported on 10/15/2020)     cholecalciferol (VITAMIN D3) 25 MCG (1000 UNIT) tablet Take 1,000 Units by mouth daily. (Patient not taking: No sig reported)     Cyanocobalamin (VITAMIN B 12) 500 MCG TABS Take by mouth. (Patient not taking: No sig reported)     magnesium chloride (SLOW-MAG) 64 MG TBEC SR tablet Take 1 tablet (64 mg total) by mouth daily. (Patient not taking: Reported on 10/15/2020) 30 tablet 1   mirtazapine (REMERON) 15 MG tablet Take by mouth. (Patient not taking: Reported on 10/15/2020)      naloxone [facility]) nasal spray 4 mg/0.1 mL once as needed (Patient not taking: No sig reported)     ondansetron (ZOFRAN) 8 MG tablet Take 1 tablet (8 mg total) by mouth every 8 (eight) hours as needed for refractory nausea / vomiting. Start on day 3 after carboplatin chemo. (Patient not taking: No sig reported) 30 tablet 1   No current facility-administered medications for this visit.   Facility-Administered Medications Ordered in Other Visits  Medication Dose Route Frequency Provider Last Rate Last Admin   heparin lock flush 100 unit/mL  500 Units Intravenous Once Corcoran, Melissa C, MD       sodium chloride flush (NS) 0.9 % injection 10 mL  10 mL Intravenous PRN Lequita Asal, MD   10 mL at 11/11/19 8676    Review of Systems  Constitutional:  Negative for chills, fever, malaise/fatigue and weight loss.  HENT:  Negative for sore throat.   Eyes:  Negative for redness.  Respiratory:  Positive for sputum production and shortness of breath. Negative for cough and wheezing.   Cardiovascular:  Negative for chest pain, palpitations and leg swelling.  Gastrointestinal:  Negative for abdominal pain, blood in stool, nausea and vomiting.  Genitourinary:  Negative for dysuria.  Musculoskeletal:  Positive for joint pain. Negative for myalgias.  Skin:  Negative for rash.  Neurological:  Negative for dizziness, tingling and tremors.  Endo/Heme/Allergies:  Does not bruise/bleed easily.  Psychiatric/Behavioral:  Negative for hallucinations.   Performance status (ECOG): 1  Vitals Blood pressure 137/83, pulse 80, temperature (!) 97 F (36.1 C), temperature source Tympanic, resp. rate 19, weight 132 lb 15 oz (60.3 kg), SpO2 100 %.   Physical Exam Constitutional:      General: He is not in acute distress. HENT:     Head: Normocephalic and atraumatic.  Eyes:     General: No scleral icterus. Cardiovascular:     Rate and Rhythm: Normal rate and regular rhythm.     Heart sounds: Normal heart  sounds.  Pulmonary:     Effort: Pulmonary effort is normal. No respiratory distress.     Breath sounds: No wheezing.     Comments: Bilateral decreased breath sound Abdominal:     General: Bowel sounds are normal. There is no distension.     Palpations: Abdomen is soft.  Musculoskeletal:        General: No deformity. Normal range of motion.     Cervical back: Normal range of motion and neck supple.  Skin:    General: Skin is warm and dry.     Findings: No erythema or rash.  Neurological:     Mental Status: He is alert and oriented to person, place, and time. Mental status is at baseline.  Cranial Nerves: No cranial nerve deficit.     Coordination: Coordination normal.  Psychiatric:        Mood and Affect: Mood normal.    Imaging studies:  Appointment on 10/15/2020  Component Date Value Ref Range Status   Sodium 10/15/2020 136  135 - 145 mmol/L Final   Potassium 10/15/2020 3.5  3.5 - 5.1 mmol/L Final   Chloride 10/15/2020 104  98 - 111 mmol/L Final   CO2 10/15/2020 25  22 - 32 mmol/L Final   Glucose, Bld 10/15/2020 113 (A) 70 - 99 mg/dL Final   Glucose reference range applies only to samples taken after fasting for at least 8 hours.   BUN 10/15/2020 16  8 - 23 mg/dL Final   Creatinine, Ser 10/15/2020 0.88  0.61 - 1.24 mg/dL Final   Calcium 10/15/2020 9.4  8.9 - 10.3 mg/dL Final   Total Protein 10/15/2020 7.6  6.5 - 8.1 g/dL Final   Albumin 10/15/2020 3.9  3.5 - 5.0 g/dL Final   AST 10/15/2020 19  15 - 41 U/L Final   ALT 10/15/2020 15  0 - 44 U/L Final   Alkaline Phosphatase 10/15/2020 115  38 - 126 U/L Final   Total Bilirubin 10/15/2020 3.1 (A) 0.3 - 1.2 mg/dL Final   GFR, Estimated 10/15/2020 >60  >60 mL/min Final   Comment: (NOTE) Calculated using the CKD-EPI Creatinine Equation (2021)    Anion gap 10/15/2020 7  5 - 15 Final   Performed at [facility], [address] [place], [place] [address]   WBC 10/15/2020 6.1  4.0 - 10.5 K/uL Final   RBC  10/15/2020 3.97 (A) 4.22 - 5.81 MIL/uL Final   Hemoglobin 10/15/2020 13.7  13.0 - 17.0 g/dL Final   HCT 10/15/2020 37.7 (A) 39.0 - 52.0 % Final   MCV 10/15/2020 95.0  80.0 - 100.0 fL Final   MCH 10/15/2020 34.5 (A) 26.0 - 34.0 pg Final   MCHC 10/15/2020 36.3 (A) 30.0 - 36.0 g/dL Final   RDW 10/15/2020 12.6  11.5 - 15.5 % Final   Platelets 10/15/2020 194  150 - 400 K/uL Final   nRBC 10/15/2020 0.0  0.0 - 0.2 % Final   Neutrophils Relative % 10/15/2020 68  % Final   Neutro Abs 10/15/2020 4.1  1.7 - 7.7 K/uL Final   Lymphocytes Relative 10/15/2020 21  % Final   Lymphs Abs 10/15/2020 1.3  0.7 - 4.0 K/uL Final   Monocytes Relative 10/15/2020 10  % Final   Monocytes Absolute 10/15/2020 0.6  0.1 - 1.0 K/uL Final   Eosinophils Relative 10/15/2020 0  % Final   Eosinophils Absolute 10/15/2020 0.0  0.0 - 0.5 K/uL Final   Basophils Relative 10/15/2020 0  % Final   Basophils Absolute 10/15/2020 0.0  0.0 - 0.1 K/uL Final   Immature Granulocytes 10/15/2020 1  % Final   Abs Immature Granulocytes 10/15/2020 0.07  0.00 - 0.07 K/uL Final   Performed at [facility], [address] [place], [place] [address]    Assessment:  Scott Jordan is a 70 y.o. male with extensive stage small cell lung cancer.   1. Small cell lung cancer ([place])   2. Encounter for antineoplastic immunotherapy   3. Hypomagnesemia   4. Chronic hepatitis C without hepatic coma ([place])   5. Discolored semen     #Extensive small cell lung cancer, s/p 4 cycles of carboplatin, etoposide and Tecentriq-  Currently on Tecentriq maintenance. Most recent CT  scan showed stable disease, except 0.6x 0.4cm pleural based nodule left upper lobe- ?benign/inflammatory. Repeat CT chest wo contrast  Labs are reviewed and discussed with patient.. Proceed with Tecentriq treatment today  #Uncontrolled hypertension, normalized today.   # History of atrial fibrillation/flutter: Continue Eliquis 5 mg twice daily. # B12 deficiency B12 was  227 on 03/23/2020 and 646 on 05/13/2020.  Continue oral vitamin B12 # Hypomagnesia; continue  Slow-Mag. # Hepatitis C,  [place]   # Dark color semen, currently asymptomatic. I recommend him to see Urology. Patient prefers to observe symptoms, if recurrent, will refer to urology.  RTC in 3 weeks for MD assessment, labs (CBC with diff, CMP, Mg, TSH), and maintenance Tecentriq.  I discussed the assessment and treatment plan with the patient.  The patient was provided an opportunity to ask questions and all were answered.  The patient agreed with the plan and demonstrated an understanding of the instructions.  The patient was advised to call back if the symptoms worsen or if the condition fails to improve as anticipated.   Earlie Server, MD, PhD Hematology Oncology [place] [facility] 10/15/2020

## 2020-10-16 LAB — T4: T4, Total: 7.2 ug/dL (ref 4.5–12.0)

## 2020-10-30 ENCOUNTER — Other Ambulatory Visit: Payer: Self-pay

## 2020-10-30 ENCOUNTER — Ambulatory Visit
Admission: RE | Admit: 2020-10-30 | Discharge: 2020-10-30 | Disposition: A | Discharge status: Home / Self Care | Payer: Medicare Other | Source: Ambulatory Visit | Attending: Oncology | Admitting: Oncology

## 2020-10-30 DIAGNOSIS — C3490 Malignant neoplasm of unspecified part of unspecified bronchus or lung: Secondary | ICD-10-CM | POA: Insufficient documentation

## 2020-10-30 DIAGNOSIS — R911 Solitary pulmonary nodule: Secondary | ICD-10-CM | POA: Diagnosis not present

## 2020-10-30 DIAGNOSIS — J439 Emphysema, unspecified: Secondary | ICD-10-CM | POA: Diagnosis not present

## 2020-10-30 DIAGNOSIS — I700 Atherosclerosis of aorta: Secondary | ICD-10-CM | POA: Diagnosis not present

## 2020-11-05 ENCOUNTER — Inpatient Hospital Stay: Payer: Medicare Other

## 2020-11-05 ENCOUNTER — Inpatient Hospital Stay (HOSPITAL_BASED_OUTPATIENT_CLINIC_OR_DEPARTMENT_OTHER): Payer: Medicare Other | Admitting: Oncology

## 2020-11-05 ENCOUNTER — Encounter: Payer: Self-pay | Admitting: Oncology

## 2020-11-05 ENCOUNTER — Inpatient Hospital Stay: Payer: Medicare Other | Attending: Oncology

## 2020-11-05 ENCOUNTER — Other Ambulatory Visit: Payer: Self-pay

## 2020-11-05 VITALS — BP 133/88 | HR 92 | Temp 96.0°F | Resp 18 | Wt 134.0 lb

## 2020-11-05 DIAGNOSIS — Z7901 Long term (current) use of anticoagulants: Secondary | ICD-10-CM | POA: Diagnosis not present

## 2020-11-05 DIAGNOSIS — C3490 Malignant neoplasm of unspecified part of unspecified bronchus or lung: Secondary | ICD-10-CM | POA: Diagnosis not present

## 2020-11-05 DIAGNOSIS — E222 Syndrome of inappropriate secretion of antidiuretic hormone: Secondary | ICD-10-CM | POA: Diagnosis not present

## 2020-11-05 DIAGNOSIS — C781 Secondary malignant neoplasm of mediastinum: Secondary | ICD-10-CM | POA: Diagnosis not present

## 2020-11-05 DIAGNOSIS — Z5112 Encounter for antineoplastic immunotherapy: Secondary | ICD-10-CM

## 2020-11-05 DIAGNOSIS — Z79899 Other long term (current) drug therapy: Secondary | ICD-10-CM | POA: Diagnosis not present

## 2020-11-05 DIAGNOSIS — I10 Essential (primary) hypertension: Secondary | ICD-10-CM | POA: Diagnosis not present

## 2020-11-05 DIAGNOSIS — C3432 Malignant neoplasm of lower lobe, left bronchus or lung: Secondary | ICD-10-CM | POA: Diagnosis not present

## 2020-11-05 DIAGNOSIS — C7951 Secondary malignant neoplasm of bone: Secondary | ICD-10-CM | POA: Diagnosis not present

## 2020-11-05 DIAGNOSIS — F17210 Nicotine dependence, cigarettes, uncomplicated: Secondary | ICD-10-CM | POA: Diagnosis not present

## 2020-11-05 DIAGNOSIS — I4891 Unspecified atrial fibrillation: Secondary | ICD-10-CM | POA: Diagnosis not present

## 2020-11-05 DIAGNOSIS — E538 Deficiency of other specified B group vitamins: Secondary | ICD-10-CM | POA: Diagnosis not present

## 2020-11-05 LAB — COMPREHENSIVE METABOLIC PANEL
ALT: 11 U/L (ref 0–44)
AST: 18 U/L (ref 15–41)
Albumin: 3.7 g/dL (ref 3.5–5.0)
Alkaline Phosphatase: 83 U/L (ref 38–126)
Anion gap: 7 (ref 5–15)
BUN: 17 mg/dL (ref 8–23)
CO2: 27 mmol/L (ref 22–32)
Calcium: 9.5 mg/dL (ref 8.9–10.3)
Chloride: 104 mmol/L (ref 98–111)
Creatinine, Ser: 0.8 mg/dL (ref 0.61–1.24)
GFR, Estimated: >60 mL/min (ref >60)
Glucose, Bld: 153 mg/dL — ABNORMAL HIGH (ref 70–99)
Potassium: 3.7 mmol/L (ref 3.5–5.1)
Sodium: 138 mmol/L (ref 135–145)
Total Bilirubin: 1.4 mg/dL — ABNORMAL HIGH (ref 0.3–1.2)
Total Protein: 7.5 g/dL (ref 6.5–8.1)

## 2020-11-05 LAB — CBC WITH DIFFERENTIAL/PLATELET
Abs Immature Granulocytes: 0.06 10*3/uL (ref 0.00–0.07)
Basophils Absolute: 0 10*3/uL (ref 0.0–0.1)
Basophils Relative: 0 %
Eosinophils Absolute: 0 10*3/uL (ref 0.0–0.5)
Eosinophils Relative: 0 %
HCT: 38.3 % — ABNORMAL LOW (ref 39.0–52.0)
Hemoglobin: 13.8 g/dL (ref 13.0–17.0)
Immature Granulocytes: 1 %
Lymphocytes Relative: 24 %
Lymphs Abs: 1.3 10*3/uL (ref 0.7–4.0)
MCH: 33.8 pg (ref 26.0–34.0)
MCHC: 36 g/dL (ref 30.0–36.0)
MCV: 93.9 fL (ref 80.0–100.0)
Monocytes Absolute: 0.4 10*3/uL (ref 0.1–1.0)
Monocytes Relative: 7 %
Neutro Abs: 3.5 10*3/uL (ref 1.7–7.7)
Neutrophils Relative %: 68 %
Platelets: 172 10*3/uL (ref 150–400)
RBC: 4.08 MIL/uL — ABNORMAL LOW (ref 4.22–5.81)
RDW: 12.4 % (ref 11.5–15.5)
WBC: 5.3 10*3/uL (ref 4.0–10.5)
nRBC: 0 % (ref 0.0–0.2)

## 2020-11-05 LAB — TSH: TSH: 1.825 u[IU]/mL (ref 0.350–4.500)

## 2020-11-05 LAB — MAGNESIUM: Magnesium: 1.6 mg/dL — ABNORMAL LOW (ref 1.7–2.4)

## 2020-11-05 MED ORDER — HEPARIN SOD (PORK) LOCK FLUSH 100 UNIT/ML IV SOLN
500.0000 [IU] | Freq: Once | INTRAVENOUS | Status: AC | PRN
  Administered 2020-11-05: 500 [IU]
  Filled 2020-11-05: qty 5

## 2020-11-05 MED ORDER — SODIUM CHLORIDE 0.9 % IV SOLN
Freq: Once | INTRAVENOUS | Status: AC
  Filled 2020-11-05: qty 250

## 2020-11-05 MED ORDER — SODIUM CHLORIDE 0.9 % IV SOLN
1200.0000 mg | Freq: Once | INTRAVENOUS | Status: AC
  Administered 2020-11-05: 1200 mg via INTRAVENOUS
  Filled 2020-11-05: qty 20

## 2020-11-05 MED ORDER — MAGNESIUM CHLORIDE 64 MG PO TBEC: 1.0000 | DELAYED_RELEASE_TABLET | Freq: Every day | ORAL | 1 refills | Status: DC

## 2020-11-05 NOTE — Patient Instructions (Signed)
[facility]  Discharge Instructions: Thank you for choosing [facility] to provide your oncology and hematology care.  If you have a lab appointment with the Cancer Center, please go directly to the Cancer Center and check in at the registration area.  Wear comfortable clothing and clothing appropriate for easy access to any Portacath or PICC line.   We strive to give you quality time with your provider. You may need to reschedule your appointment if you arrive late (15 or more minutes).  Arriving late affects you and other patients whose appointments are after yours.  Also, if you miss three or more appointments without notifying the office, you may be dismissed from the clinic at the provider's discretion.      For prescription refill requests, have your pharmacy contact our office and allow 72 hours for refills to be completed.    Today you received the following chemotherapy and/or immunotherapy agents       To help prevent nausea and vomiting after your treatment, we encourage you to take your nausea medication as directed.  BELOW ARE SYMPTOMS THAT SHOULD BE REPORTED IMMEDIATELY: *FEVER GREATER THAN 100.4 F (38 C) OR HIGHER *CHILLS OR SWEATING *NAUSEA AND VOMITING THAT IS NOT CONTROLLED WITH YOUR NAUSEA MEDICATION *UNUSUAL SHORTNESS OF BREATH *UNUSUAL BRUISING OR BLEEDING *URINARY PROBLEMS (pain or burning when urinating, or frequent urination) *BOWEL PROBLEMS (unusual diarrhea, constipation, pain near the anus) TENDERNESS IN MOUTH AND THROAT WITH OR WITHOUT PRESENCE OF ULCERS (sore throat, sores in mouth, or a toothache) UNUSUAL RASH, SWELLING OR PAIN  UNUSUAL VAGINAL DISCHARGE OR ITCHING   Items with * indicate a potential emergency and should be followed up as soon as possible or go to the Emergency Department if any problems should occur.  Please show the CHEMOTHERAPY ALERT CARD or IMMUNOTHERAPY ALERT CARD at check-in to the Emergency  Department and triage nurse.  Should you have questions after your visit or need to cancel or reschedule your appointment, please contact [facility]  [phone] and follow the prompts.  Office hours are 8:00 a.m. to 4:30 p.m. Monday - Friday. Please note that voicemails left after 4:00 p.m. may not be returned until the following business day.  We are closed weekends and major holidays. You have access to a nurse at all times for urgent questions. Please call the main number to the clinic [phone] and follow the prompts.  For any non-urgent questions, you may also contact your provider using MyChart. We now offer e-Visits for anyone 18 and older to request care online for non-urgent symptoms. For details visit [email or web address].   Also download the MyChart app! Go to the app store, search "MyChart", open the app, select [facility], and log in with your MyChart username and password.  Due to Covid, a mask is required upon entering the hospital/clinic. If you do not have a mask, one will be given to you upon arrival. For doctor visits, patients may have 1 support person aged 18 or older with them. For treatment visits, patients cannot have anyone with them due to current Covid guidelines and our immunocompromised population.  

## 2020-11-05 NOTE — Progress Notes (Signed)
[facility]  [address], [address] [place], [place] [address] Phone: [phone]  Fax: [phone]   Clinic Day:  11/05/20  Referring physician: No ref. provider found  Chief Complaint: Scott Jordan is a 70 y.o. male with extensive stage small cell lung cancer who is seen for assessment prior to maintenance Tecentriq.  PERTINENT ONCOLOGY HISTORY Scott Jordan is a 70 y.o.[place] who has above oncology history reviewed by me today presented for follow up visit for management of extensive small cell lung cancer Patient previously followed up by Dr.Corcoran, patient switched care to me on 08/27/20 Extensive medical records review was performed by me.  He has a > 40 pack year smoking.  He presented with a recurrent left sided pleural effusion.  He has undergone thoracentesis x 2 in the past month.  He is s/p pigtail drain.  Initial cytology x 2 was negative (lymphocytic exudative effusion).  Pleural fluid cytology on 08/18/2019 confirmed small cell lung cancer.     08/19/2019 Chest CT without contrast on revealed the left pleural pigtail drain.  There was a small residual left hydropneumothorax.  There was a 6.7 x 4 cm medial basilar left lower lobe lung mass suspicious for primary bronchogenic carcinoma.  There was a lingular 1.0 cm solid pulmonary nodule suspicious for ipsilateral metastasis.  There was intralobular septal thickening and patchy groundglass opacity throughout the lingula and the left lower lobe with moderate residual atelectasis of the left lung base.  A component of lymphangitic carcinomatosis was suspected.  There was ipsilateral infra hilar, subcarinal, AP window, prevascular and upper retroperitoneal adenopathy c/w metastatic disease.   05/12/2021Brain MRI on  revealed no evidence of metastatic disease.   08/29/2019 PET scan on  revealed markedly hypermetabolic left lower lobe pulmonary lesion (SUV 7.9) hypermetabolic nodal metastases in the  mediastinum and left hilum with metastatic involvement of left pleural space. Additional sites included upper abdominal hypermetabolic lymphadenopathy and scattered hypermetabolic bone metastases (left acetabulum, sacrum, and L5). There was interval decrease in left pleural effusion with areas of loculation.  CEA was 3.7 on 09/02/2019, 3.8 on 02/17/2020 and 3.1 on 05/13/2020.  LDH was 206 on 09/23/2019, 144 on 10/16/2019 and 102 on 05/13/2020.   09/02/2019- 11/11/2019 He is s/p 4 cycles of carboplatin, etoposide, and Tecentriq with Udencya support.   10/11/2019: CT chest abdomen pelvis showed reduction of chest and abdominal lymphadenopathy.  Pleural nodularity improved. 11/22/2019 - 11/25/2019 admission [facility] with a heroin overdose and aspiration pneumonia.  He received 1 unit of PRBCs.  He was treated with Zosyn.  He was discharged on Augmentin x5 days. 12/02/2019 - 02/17/2020  maintenance Tecentriq 04/22/2020 - present  maintenance Tecentriq 01/09/2020 - 01/22/2020 He received PCI  03/10/2020: Brain MRI no metastasis 03/10/2020  Cervical spine MRI revealed no evidence of metastatic disease to the cervical spine.  Multilevel DJD 03/19/2020:  PET scan mild patchy left lower lobe opacity, stable versus mildly improved 06/24/2020 Head CT without contrast: No mets 06/23/2020: CT chest abdomen pelvis: new 0.6 x 0.4 cm pleural-based nodule in the left upper lobe  06/24/2020: Head CT without contrast no metastasis.   Other chronic medical problems.  hyponatremia secondary to SIADH.  He began sodium chloride tablets (1 gm TID) on 09/04/2019.   B12 deficiency.  B12 was 227 on 03/23/2020 and 646 on 05/13/2020.   He is on oral B12.  Folate was 19.2 on 04/22/2020.   left upper extremity symptoms c/w a left-sided C6 radiculopathy as well as  ulnar neuropathy.   Hepatitis C antibody and hepatitis B core antibody total were positive on 12/23/2019.  Hepatitis B surface antigen, hepatitis B core IgM and  hepatitis A IgM were negative.  Hepatitis B DNA was not detected. Hepatitis C RNA quantitative revealed 3.869 log 10 IU/mL (7400 IU/mL).  Hepatitis C genotype is 2b. He sees gastroenterology for Hep C treatment  # Hypertension: follows up with Dr. Ubaldo Glassing and was recently seen and was recommended to continue blood pressure meds.  #  Left C6 radiculopathy and ulnar neuropathy likely secondary to nerve entrapment at the elbow. Right MRI on 03/10/2020 revealed no intracranial metastasis.  Cervical spine MRI confirmed no metastasis, severe DJD, bilateral neuroforaminal narrowing.followe dby  Dr. Donneta Romberg and Dr. Cari Caraway.  INTERVAL HISTORY Scott Jordan is a 70 y.o. male who has above history reviewed by me today presents for follow up visit for treatment for his extensive small cell lung cancer. Problems and complaints are listed below: Patient has been on maintenance Tecentriq.   He has no new complaint.  Denies nausea vomiting diarrhea, fever, shortness of breath, chest pain, diarrhea  10/30/2020 CT chest without contrast showed marked interval progression of peripheral left upper lobe pulmonary nodule, 13 x 8 mm, compared to previous 6 x 4 mm.  Suspecting malignancy.  Stable appearance of posterior left lower lobe consolidative opacity.  7 mm short axis AP window node is mildly progressive.   Past Medical History:  Diagnosis Date   A-fib ([place]) 01/10/2019   pt st this was dx by Dr. Ubaldo Glassing   Cancer [facility]) 4098   LUNG   Complication of anesthesia    DIFFICULTY WAKING UP AFTER SURGERY- 20 YRS AGO   Hypertension    Lung cancer ([place])    Substance abuse ([place])     Past Surgical History:  Procedure Laterality Date   ARM DEBRIDEMENT Left    INCISION AND DEBRIDEMENT LOWER ARM -20 YRS AGO   BACK SURGERY     PORTACATH PLACEMENT Left 08/29/2019   Procedure: INSERTION PORT-A-CATH;  Surgeon: Nestor Lewandowsky, MD;  Location: [facility] ORS;  Service: General;  Laterality: Left;    History reviewed. No pertinent  family history.  Social History:  reports that he has quit smoking. His smoking use included cigarettes. He smoked an average of .5 packs per day. He has never used smokeless tobacco. He reports current alcohol use. He reports previous drug use. Drug: Heroin. The patient denies any exposure to radiation or toxins.  The patient lives in [place]. He is dependent on public transportation or transportation from friends/family. He has family friends staying with him to help care for his needs. His niece, [name] [phone]), is his medical power of attorney. The patient is alone today.   Allergies: No Known Allergies  Current Medications: Current Outpatient Medications  Medication Sig Dispense Refill   diltiazem (TIAZAC) 240 MG 24 hr capsule Take 1 capsule by mouth daily.     ELIQUIS 5 MG TABS tablet Take 5 mg by mouth 2 (two) times daily.     potassium chloride SA (KLOR-CON) 20 MEQ tablet TAKE 1 TABLET BY MOUTH DAILY FOR 3 DAYS 3 tablet 0   diltiazem (CARDIZEM) 30 MG tablet Take by mouth. (Patient not taking: Reported on 11/05/2020)     diltiazem (TIAZAC) 240 MG 24 hr capsule Take 1 capsule by mouth daily.     feeding supplement, ENSURE ENLIVE, (ENSURE ENLIVE) LIQD Take 237 mLs by mouth 3 (three) times daily between meals. (Patient  not taking: Reported on 11/05/2020) 237 mL 12   lidocaine-prilocaine (EMLA) cream Apply to affected area once 30 g 3   magnesium chloride (SLOW-MAG) 64 MG TBEC SR tablet Take 1 tablet (64 mg total) by mouth daily. 30 tablet 1   MAVYRET 100-40 MG TABS Take 3 tablets by mouth daily. (Patient not taking: Reported on 11/05/2020)     mirtazapine (REMERON) 15 MG tablet Take by mouth. (Patient not taking: No sig reported)     naloxone (NARCAN) nasal spray 4 mg/0.1 mL once as needed (Patient not taking: No sig reported)     ondansetron (ZOFRAN) 8 MG tablet Take 1 tablet (8 mg total) by mouth every 8 (eight) hours as needed for refractory nausea / vomiting. Start on day 3  after carboplatin chemo. (Patient not taking: No sig reported) 30 tablet 1   potassium chloride SA (KLOR-CON) 20 MEQ tablet TAKE 1 TABLET BY MOUTH DAILY FOR 3 DAYS 3 tablet 0   No current facility-administered medications for this visit.   Facility-Administered Medications Ordered in Other Visits  Medication Dose Route Frequency Provider Last Rate Last Admin   0.9 %  sodium chloride infusion   Intravenous Once Earlie Server, MD       atezolizumab [facility]) 1,200 mg in sodium chloride 0.9 % 250 mL chemo infusion  1,200 mg Intravenous Once Earlie Server, MD       heparin lock flush 100 unit/mL  500 Units Intravenous Once Nolon Stalls C, MD       heparin lock flush 100 unit/mL  500 Units Intracatheter Once PRN Earlie Server, MD       sodium chloride flush (NS) 0.9 % injection 10 mL  10 mL Intravenous PRN Lequita Asal, MD   10 mL at 11/11/19 1696    Review of Systems  Constitutional:  Negative for chills, fever, malaise/fatigue and weight loss.  HENT:  Negative for sore throat.   Eyes:  Negative for redness.  Respiratory:  Positive for sputum production and shortness of breath. Negative for cough and wheezing.   Cardiovascular:  Negative for chest pain, palpitations and leg swelling.  Gastrointestinal:  Negative for abdominal pain, blood in stool, nausea and vomiting.  Genitourinary:  Negative for dysuria.  Musculoskeletal:  Positive for joint pain. Negative for myalgias.  Skin:  Negative for rash.  Neurological:  Negative for dizziness, tingling and tremors.  Endo/Heme/Allergies:  Does not bruise/bleed easily.  Psychiatric/Behavioral:  Negative for hallucinations.   Performance status (ECOG): 1  Vitals Blood pressure 133/88, pulse 92, temperature (!) 96 F (35.6 C), resp. rate 18, weight 134 lb 0.6 oz (60.8 kg).   Physical Exam Constitutional:      General: He is not in acute distress. HENT:     Head: Normocephalic and atraumatic.  Eyes:     General: No scleral  icterus. Cardiovascular:     Rate and Rhythm: Normal rate and regular rhythm.     Heart sounds: Normal heart sounds.  Pulmonary:     Effort: Pulmonary effort is normal. No respiratory distress.     Breath sounds: No wheezing.     Comments: Bilateral decreased breath sound Abdominal:     General: Bowel sounds are normal. There is no distension.     Palpations: Abdomen is soft.  Musculoskeletal:        General: No deformity. Normal range of motion.     Cervical back: Normal range of motion and neck supple.  Skin:    General: Skin is warm  and dry.     Findings: No erythema or rash.  Neurological:     Mental Status: He is alert and oriented to person, place, and time. Mental status is at baseline.     Cranial Nerves: No cranial nerve deficit.     Coordination: Coordination normal.  Psychiatric:        Mood and Affect: Mood normal.    Imaging studies:  Infusion on 11/05/2020  Component Date Value Ref Range Status   WBC 11/05/2020 5.3  4.0 - 10.5 K/uL Final   RBC 11/05/2020 4.08 (A) 4.22 - 5.81 MIL/uL Final   Hemoglobin 11/05/2020 13.8  13.0 - 17.0 g/dL Final   HCT 11/05/2020 38.3 (A) 39.0 - 52.0 % Final   MCV 11/05/2020 93.9  80.0 - 100.0 fL Final   MCH 11/05/2020 33.8  26.0 - 34.0 pg Final   MCHC 11/05/2020 36.0  30.0 - 36.0 g/dL Final   RDW 11/05/2020 12.4  11.5 - 15.5 % Final   Platelets 11/05/2020 172  150 - 400 K/uL Final   nRBC 11/05/2020 0.0  0.0 - 0.2 % Final   Neutrophils Relative % 11/05/2020 68  % Final   Neutro Abs 11/05/2020 3.5  1.7 - 7.7 K/uL Final   Lymphocytes Relative 11/05/2020 24  % Final   Lymphs Abs 11/05/2020 1.3  0.7 - 4.0 K/uL Final   Monocytes Relative 11/05/2020 7  % Final   Monocytes Absolute 11/05/2020 0.4  0.1 - 1.0 K/uL Final   Eosinophils Relative 11/05/2020 0  % Final   Eosinophils Absolute 11/05/2020 0.0  0.0 - 0.5 K/uL Final   Basophils Relative 11/05/2020 0  % Final   Basophils Absolute 11/05/2020 0.0  0.0 - 0.1 K/uL Final   Immature  Granulocytes 11/05/2020 1  % Final   Abs Immature Granulocytes 11/05/2020 0.06  0.00 - 0.07 K/uL Final   Performed at [facility], [address], [place], [place] [address]   Sodium 11/05/2020 138  135 - 145 mmol/L Final   Potassium 11/05/2020 3.7  3.5 - 5.1 mmol/L Final   Chloride 11/05/2020 104  98 - 111 mmol/L Final   CO2 11/05/2020 27  22 - 32 mmol/L Final   Glucose, Bld 11/05/2020 153 (A) 70 - 99 mg/dL Final   Glucose reference range applies only to samples taken after fasting for at least 8 hours.   BUN 11/05/2020 17  8 - 23 mg/dL Final   Creatinine, Ser 11/05/2020 0.80  0.61 - 1.24 mg/dL Final   Calcium 11/05/2020 9.5  8.9 - 10.3 mg/dL Final   Total Protein 11/05/2020 7.5  6.5 - 8.1 g/dL Final   Albumin 11/05/2020 3.7  3.5 - 5.0 g/dL Final   AST 11/05/2020 18  15 - 41 U/L Final   ALT 11/05/2020 11  0 - 44 U/L Final   Alkaline Phosphatase 11/05/2020 83  38 - 126 U/L Final   Total Bilirubin 11/05/2020 1.4 (A) 0.3 - 1.2 mg/dL Final   GFR, Estimated 11/05/2020 >60  >60 mL/min Final   Comment: (NOTE) Calculated using the CKD-EPI Creatinine Equation (2021)    Anion gap 11/05/2020 7  5 - 15 Final   Performed at [facility], [address], [place], [place] [address]    Assessment:  Scott Jordan is a 70 y.o. male with extensive stage small cell lung cancer.   1. Small cell lung cancer ([place])   2. Encounter for antineoplastic immunotherapy   3. Hypomagnesemia     #  Extensive small cell lung cancer, s/p 4 cycles of carboplatin, etoposide and Tecentriq-  Currently on Tecentriq maintenance. 10/30/2020 CT scan showed progression of the left upper lobe pleural-based nodule. Obtain PET scan ASAP, consider rebiopsy of the lesion. Labs are reviewed and discussed with patient. Proceed with Tecentriq treatment today  # History of atrial fibrillation/flutter: Continue Eliquis 5 mg twice daily. # B12 deficiency B12 was 227 on 03/23/2020 and 646 on 05/13/2020.   Continue oral vitamin B12 # Hypomagnesia; patient reports that he has not been taking Slow-Mag.  Recommend patient to restart Slow-Mag.  Prescription sent. # Hepatitis C,  [place]   # Dark color semen, currently asymptomatic. I recommend him to see Urology. Patient prefers to observe symptoms, if recurrent, will refer to urology.  RTC TBD  I discussed the assessment and treatment plan with the patient.  The patient was provided an opportunity to ask questions and all were answered.  The patient agreed with the plan and demonstrated an understanding of the instructions.  The patient was advised to call back if the symptoms worsen or if the condition fails to improve as anticipated.   Earlie Server, MD, PhD Hematology Oncology [place] at [facility] 11/05/2020

## 2020-11-09 DIAGNOSIS — R278 Other lack of coordination: Secondary | ICD-10-CM | POA: Diagnosis not present

## 2020-11-13 DIAGNOSIS — M50322 Other cervical disc degeneration at C5-C6 level: Secondary | ICD-10-CM | POA: Diagnosis not present

## 2020-11-13 DIAGNOSIS — C3490 Malignant neoplasm of unspecified part of unspecified bronchus or lung: Secondary | ICD-10-CM | POA: Diagnosis not present

## 2020-11-13 DIAGNOSIS — I10 Essential (primary) hypertension: Secondary | ICD-10-CM | POA: Diagnosis not present

## 2020-11-13 DIAGNOSIS — Z9181 History of falling: Secondary | ICD-10-CM | POA: Diagnosis not present

## 2020-11-13 DIAGNOSIS — Z87891 Personal history of nicotine dependence: Secondary | ICD-10-CM | POA: Diagnosis not present

## 2020-11-13 DIAGNOSIS — G479 Sleep disorder, unspecified: Secondary | ICD-10-CM | POA: Diagnosis not present

## 2020-11-13 DIAGNOSIS — M4802 Spinal stenosis, cervical region: Secondary | ICD-10-CM | POA: Diagnosis not present

## 2020-11-13 DIAGNOSIS — Z7901 Long term (current) use of anticoagulants: Secondary | ICD-10-CM | POA: Diagnosis not present

## 2020-11-13 DIAGNOSIS — I4891 Unspecified atrial fibrillation: Secondary | ICD-10-CM | POA: Diagnosis not present

## 2020-11-13 DIAGNOSIS — R278 Other lack of coordination: Secondary | ICD-10-CM | POA: Diagnosis not present

## 2020-11-13 DIAGNOSIS — M47813 Spondylosis without myelopathy or radiculopathy, cervicothoracic region: Secondary | ICD-10-CM | POA: Diagnosis not present

## 2020-11-13 DIAGNOSIS — M25522 Pain in left elbow: Secondary | ICD-10-CM | POA: Diagnosis not present

## 2020-11-16 ENCOUNTER — Telehealth: Payer: Self-pay | Admitting: Family Medicine

## 2020-11-17 DIAGNOSIS — M4802 Spinal stenosis, cervical region: Secondary | ICD-10-CM | POA: Diagnosis not present

## 2020-11-17 DIAGNOSIS — M50322 Other cervical disc degeneration at C5-C6 level: Secondary | ICD-10-CM | POA: Diagnosis not present

## 2020-11-17 DIAGNOSIS — M47813 Spondylosis without myelopathy or radiculopathy, cervicothoracic region: Secondary | ICD-10-CM | POA: Diagnosis not present

## 2020-11-17 DIAGNOSIS — Z87891 Personal history of nicotine dependence: Secondary | ICD-10-CM | POA: Diagnosis not present

## 2020-11-17 DIAGNOSIS — G479 Sleep disorder, unspecified: Secondary | ICD-10-CM | POA: Diagnosis not present

## 2020-11-17 DIAGNOSIS — Z7901 Long term (current) use of anticoagulants: Secondary | ICD-10-CM | POA: Diagnosis not present

## 2020-11-17 DIAGNOSIS — C3490 Malignant neoplasm of unspecified part of unspecified bronchus or lung: Secondary | ICD-10-CM | POA: Diagnosis not present

## 2020-11-17 DIAGNOSIS — R278 Other lack of coordination: Secondary | ICD-10-CM | POA: Diagnosis not present

## 2020-11-17 DIAGNOSIS — M25522 Pain in left elbow: Secondary | ICD-10-CM | POA: Diagnosis not present

## 2020-11-17 DIAGNOSIS — I4891 Unspecified atrial fibrillation: Secondary | ICD-10-CM | POA: Diagnosis not present

## 2020-11-17 DIAGNOSIS — Z9181 History of falling: Secondary | ICD-10-CM | POA: Diagnosis not present

## 2020-11-18 ENCOUNTER — Ambulatory Visit
Admission: RE | Admit: 2020-11-18 | Discharge: 2020-11-18 | Disposition: A | Discharge status: Home / Self Care | Payer: Medicare Other | Source: Ambulatory Visit | Attending: Oncology | Admitting: Oncology

## 2020-11-18 ENCOUNTER — Other Ambulatory Visit: Payer: Self-pay

## 2020-11-18 DIAGNOSIS — C3490 Malignant neoplasm of unspecified part of unspecified bronchus or lung: Secondary | ICD-10-CM

## 2020-11-19 ENCOUNTER — Ambulatory Visit
Admission: RE | Admit: 2020-11-19 | Discharge: 2020-11-19 | Disposition: A | Discharge status: Home / Self Care | Payer: Medicare Other | Source: Ambulatory Visit | Attending: Oncology | Admitting: Oncology

## 2020-11-19 DIAGNOSIS — M50322 Other cervical disc degeneration at C5-C6 level: Secondary | ICD-10-CM | POA: Diagnosis not present

## 2020-11-19 DIAGNOSIS — Z87891 Personal history of nicotine dependence: Secondary | ICD-10-CM | POA: Diagnosis not present

## 2020-11-19 DIAGNOSIS — C3490 Malignant neoplasm of unspecified part of unspecified bronchus or lung: Secondary | ICD-10-CM | POA: Diagnosis not present

## 2020-11-19 DIAGNOSIS — M47813 Spondylosis without myelopathy or radiculopathy, cervicothoracic region: Secondary | ICD-10-CM | POA: Diagnosis not present

## 2020-11-19 DIAGNOSIS — Z9181 History of falling: Secondary | ICD-10-CM | POA: Diagnosis not present

## 2020-11-19 DIAGNOSIS — I4891 Unspecified atrial fibrillation: Secondary | ICD-10-CM | POA: Diagnosis not present

## 2020-11-19 DIAGNOSIS — G479 Sleep disorder, unspecified: Secondary | ICD-10-CM | POA: Diagnosis not present

## 2020-11-19 DIAGNOSIS — Z7901 Long term (current) use of anticoagulants: Secondary | ICD-10-CM | POA: Diagnosis not present

## 2020-11-19 DIAGNOSIS — M25522 Pain in left elbow: Secondary | ICD-10-CM | POA: Diagnosis not present

## 2020-11-19 DIAGNOSIS — M4802 Spinal stenosis, cervical region: Secondary | ICD-10-CM | POA: Diagnosis not present

## 2020-11-19 DIAGNOSIS — K8020 Calculus of gallbladder without cholecystitis without obstruction: Secondary | ICD-10-CM | POA: Diagnosis not present

## 2020-11-19 DIAGNOSIS — R278 Other lack of coordination: Secondary | ICD-10-CM | POA: Diagnosis not present

## 2020-11-19 LAB — GLUCOSE, CAPILLARY: Glucose-Capillary: 101 mg/dL — ABNORMAL HIGH (ref 70–99)

## 2020-11-19 MED ORDER — FLUDEOXYGLUCOSE F - 18 (FDG) INJECTION
6.7900 | Freq: Once | INTRAVENOUS | Status: AC | PRN
  Administered 2020-11-19: 6.79 via INTRAVENOUS

## 2020-11-23 DIAGNOSIS — C3490 Malignant neoplasm of unspecified part of unspecified bronchus or lung: Secondary | ICD-10-CM | POA: Diagnosis not present

## 2020-11-23 DIAGNOSIS — M4802 Spinal stenosis, cervical region: Secondary | ICD-10-CM | POA: Diagnosis not present

## 2020-11-23 DIAGNOSIS — Z9181 History of falling: Secondary | ICD-10-CM | POA: Diagnosis not present

## 2020-11-23 DIAGNOSIS — M50322 Other cervical disc degeneration at C5-C6 level: Secondary | ICD-10-CM | POA: Diagnosis not present

## 2020-11-23 DIAGNOSIS — G479 Sleep disorder, unspecified: Secondary | ICD-10-CM | POA: Diagnosis not present

## 2020-11-23 DIAGNOSIS — M25522 Pain in left elbow: Secondary | ICD-10-CM | POA: Diagnosis not present

## 2020-11-23 DIAGNOSIS — R278 Other lack of coordination: Secondary | ICD-10-CM | POA: Diagnosis not present

## 2020-11-23 DIAGNOSIS — Z87891 Personal history of nicotine dependence: Secondary | ICD-10-CM | POA: Diagnosis not present

## 2020-11-23 DIAGNOSIS — M47813 Spondylosis without myelopathy or radiculopathy, cervicothoracic region: Secondary | ICD-10-CM | POA: Diagnosis not present

## 2020-11-23 DIAGNOSIS — I4891 Unspecified atrial fibrillation: Secondary | ICD-10-CM | POA: Diagnosis not present

## 2020-11-23 DIAGNOSIS — Z7901 Long term (current) use of anticoagulants: Secondary | ICD-10-CM | POA: Diagnosis not present

## 2020-11-24 DIAGNOSIS — M25522 Pain in left elbow: Secondary | ICD-10-CM | POA: Diagnosis not present

## 2020-11-24 DIAGNOSIS — C3490 Malignant neoplasm of unspecified part of unspecified bronchus or lung: Secondary | ICD-10-CM | POA: Diagnosis not present

## 2020-11-24 DIAGNOSIS — G479 Sleep disorder, unspecified: Secondary | ICD-10-CM | POA: Diagnosis not present

## 2020-11-24 DIAGNOSIS — Z87891 Personal history of nicotine dependence: Secondary | ICD-10-CM | POA: Diagnosis not present

## 2020-11-24 DIAGNOSIS — R278 Other lack of coordination: Secondary | ICD-10-CM | POA: Diagnosis not present

## 2020-11-24 DIAGNOSIS — M50322 Other cervical disc degeneration at C5-C6 level: Secondary | ICD-10-CM | POA: Diagnosis not present

## 2020-11-24 DIAGNOSIS — I4891 Unspecified atrial fibrillation: Secondary | ICD-10-CM | POA: Diagnosis not present

## 2020-11-24 DIAGNOSIS — M47813 Spondylosis without myelopathy or radiculopathy, cervicothoracic region: Secondary | ICD-10-CM | POA: Diagnosis not present

## 2020-11-24 DIAGNOSIS — M4802 Spinal stenosis, cervical region: Secondary | ICD-10-CM | POA: Diagnosis not present

## 2020-11-24 DIAGNOSIS — Z7901 Long term (current) use of anticoagulants: Secondary | ICD-10-CM | POA: Diagnosis not present

## 2020-11-24 DIAGNOSIS — Z9181 History of falling: Secondary | ICD-10-CM | POA: Diagnosis not present

## 2020-11-26 DIAGNOSIS — M25522 Pain in left elbow: Secondary | ICD-10-CM | POA: Diagnosis not present

## 2020-11-26 DIAGNOSIS — M50322 Other cervical disc degeneration at C5-C6 level: Secondary | ICD-10-CM | POA: Diagnosis not present

## 2020-11-26 DIAGNOSIS — C3490 Malignant neoplasm of unspecified part of unspecified bronchus or lung: Secondary | ICD-10-CM | POA: Diagnosis not present

## 2020-11-26 DIAGNOSIS — G479 Sleep disorder, unspecified: Secondary | ICD-10-CM | POA: Diagnosis not present

## 2020-11-26 DIAGNOSIS — Z87891 Personal history of nicotine dependence: Secondary | ICD-10-CM | POA: Diagnosis not present

## 2020-11-26 DIAGNOSIS — Z9181 History of falling: Secondary | ICD-10-CM | POA: Diagnosis not present

## 2020-11-26 DIAGNOSIS — M47813 Spondylosis without myelopathy or radiculopathy, cervicothoracic region: Secondary | ICD-10-CM | POA: Diagnosis not present

## 2020-11-26 DIAGNOSIS — Z7901 Long term (current) use of anticoagulants: Secondary | ICD-10-CM | POA: Diagnosis not present

## 2020-11-26 DIAGNOSIS — M4802 Spinal stenosis, cervical region: Secondary | ICD-10-CM | POA: Diagnosis not present

## 2020-11-26 DIAGNOSIS — I4891 Unspecified atrial fibrillation: Secondary | ICD-10-CM | POA: Diagnosis not present

## 2020-11-26 DIAGNOSIS — R278 Other lack of coordination: Secondary | ICD-10-CM | POA: Diagnosis not present

## 2020-11-30 DIAGNOSIS — M4802 Spinal stenosis, cervical region: Secondary | ICD-10-CM | POA: Diagnosis not present

## 2020-11-30 DIAGNOSIS — C3490 Malignant neoplasm of unspecified part of unspecified bronchus or lung: Secondary | ICD-10-CM | POA: Diagnosis not present

## 2020-11-30 DIAGNOSIS — Z9181 History of falling: Secondary | ICD-10-CM | POA: Diagnosis not present

## 2020-11-30 DIAGNOSIS — Z7901 Long term (current) use of anticoagulants: Secondary | ICD-10-CM | POA: Diagnosis not present

## 2020-11-30 DIAGNOSIS — M50322 Other cervical disc degeneration at C5-C6 level: Secondary | ICD-10-CM | POA: Diagnosis not present

## 2020-11-30 DIAGNOSIS — M25522 Pain in left elbow: Secondary | ICD-10-CM | POA: Diagnosis not present

## 2020-11-30 DIAGNOSIS — I4891 Unspecified atrial fibrillation: Secondary | ICD-10-CM | POA: Diagnosis not present

## 2020-11-30 DIAGNOSIS — M47813 Spondylosis without myelopathy or radiculopathy, cervicothoracic region: Secondary | ICD-10-CM | POA: Diagnosis not present

## 2020-11-30 DIAGNOSIS — G479 Sleep disorder, unspecified: Secondary | ICD-10-CM | POA: Diagnosis not present

## 2020-11-30 DIAGNOSIS — Z87891 Personal history of nicotine dependence: Secondary | ICD-10-CM | POA: Diagnosis not present

## 2020-11-30 DIAGNOSIS — R278 Other lack of coordination: Secondary | ICD-10-CM | POA: Diagnosis not present

## 2020-12-02 ENCOUNTER — Telehealth: Payer: Self-pay

## 2020-12-02 DIAGNOSIS — G479 Sleep disorder, unspecified: Secondary | ICD-10-CM | POA: Diagnosis not present

## 2020-12-02 DIAGNOSIS — M25522 Pain in left elbow: Secondary | ICD-10-CM | POA: Diagnosis not present

## 2020-12-02 DIAGNOSIS — M50322 Other cervical disc degeneration at C5-C6 level: Secondary | ICD-10-CM | POA: Diagnosis not present

## 2020-12-02 DIAGNOSIS — Z7901 Long term (current) use of anticoagulants: Secondary | ICD-10-CM | POA: Diagnosis not present

## 2020-12-02 DIAGNOSIS — C3490 Malignant neoplasm of unspecified part of unspecified bronchus or lung: Secondary | ICD-10-CM | POA: Diagnosis not present

## 2020-12-02 DIAGNOSIS — Z87891 Personal history of nicotine dependence: Secondary | ICD-10-CM | POA: Diagnosis not present

## 2020-12-02 DIAGNOSIS — I4891 Unspecified atrial fibrillation: Secondary | ICD-10-CM | POA: Diagnosis not present

## 2020-12-02 DIAGNOSIS — Z9181 History of falling: Secondary | ICD-10-CM | POA: Diagnosis not present

## 2020-12-02 DIAGNOSIS — R278 Other lack of coordination: Secondary | ICD-10-CM | POA: Diagnosis not present

## 2020-12-02 DIAGNOSIS — M4802 Spinal stenosis, cervical region: Secondary | ICD-10-CM | POA: Diagnosis not present

## 2020-12-02 DIAGNOSIS — M47813 Spondylosis without myelopathy or radiculopathy, cervicothoracic region: Secondary | ICD-10-CM | POA: Diagnosis not present

## 2020-12-02 NOTE — Telephone Encounter (Signed)
12/02/2020 Spoke w/ Scott Jordan and informed him of appt tomorrow, 8/25, @ 11:30 per MD. Lucianne Lei scheduled to pick him up @ 10:30. Scott Jordan confirmed appt  SRW

## 2020-12-02 NOTE — Telephone Encounter (Signed)
-----   Message from Earlie Server, MD sent at 12/01/2020 11:12 PM EDT ----- Please add him to my schedule on 8/25 to go over PET scan result, just MD.

## 2020-12-02 NOTE — Telephone Encounter (Signed)
Please schedule pt as requested by MD and notify him of appt. Thanks

## 2020-12-03 ENCOUNTER — Other Ambulatory Visit: Payer: Self-pay

## 2020-12-03 ENCOUNTER — Inpatient Hospital Stay: Payer: Medicare Other | Attending: Oncology | Admitting: Oncology

## 2020-12-03 ENCOUNTER — Encounter: Payer: Self-pay | Admitting: Oncology

## 2020-12-03 VITALS — BP 145/109 | HR 89 | Temp 98.0°F | Resp 16 | Wt 135.1 lb

## 2020-12-03 DIAGNOSIS — R278 Other lack of coordination: Secondary | ICD-10-CM | POA: Diagnosis not present

## 2020-12-03 DIAGNOSIS — M25522 Pain in left elbow: Secondary | ICD-10-CM | POA: Diagnosis not present

## 2020-12-03 DIAGNOSIS — G479 Sleep disorder, unspecified: Secondary | ICD-10-CM | POA: Diagnosis not present

## 2020-12-03 DIAGNOSIS — Z7901 Long term (current) use of anticoagulants: Secondary | ICD-10-CM | POA: Diagnosis not present

## 2020-12-03 DIAGNOSIS — F17210 Nicotine dependence, cigarettes, uncomplicated: Secondary | ICD-10-CM | POA: Diagnosis not present

## 2020-12-03 DIAGNOSIS — I10 Essential (primary) hypertension: Secondary | ICD-10-CM | POA: Insufficient documentation

## 2020-12-03 DIAGNOSIS — I4891 Unspecified atrial fibrillation: Secondary | ICD-10-CM | POA: Diagnosis not present

## 2020-12-03 DIAGNOSIS — C781 Secondary malignant neoplasm of mediastinum: Secondary | ICD-10-CM | POA: Insufficient documentation

## 2020-12-03 DIAGNOSIS — M50322 Other cervical disc degeneration at C5-C6 level: Secondary | ICD-10-CM | POA: Diagnosis not present

## 2020-12-03 DIAGNOSIS — B1920 Unspecified viral hepatitis C without hepatic coma: Secondary | ICD-10-CM | POA: Diagnosis not present

## 2020-12-03 DIAGNOSIS — M47813 Spondylosis without myelopathy or radiculopathy, cervicothoracic region: Secondary | ICD-10-CM | POA: Diagnosis not present

## 2020-12-03 DIAGNOSIS — C7951 Secondary malignant neoplasm of bone: Secondary | ICD-10-CM | POA: Diagnosis not present

## 2020-12-03 DIAGNOSIS — M4802 Spinal stenosis, cervical region: Secondary | ICD-10-CM | POA: Diagnosis not present

## 2020-12-03 DIAGNOSIS — C3432 Malignant neoplasm of lower lobe, left bronchus or lung: Secondary | ICD-10-CM | POA: Insufficient documentation

## 2020-12-03 DIAGNOSIS — E538 Deficiency of other specified B group vitamins: Secondary | ICD-10-CM | POA: Insufficient documentation

## 2020-12-03 DIAGNOSIS — C3490 Malignant neoplasm of unspecified part of unspecified bronchus or lung: Secondary | ICD-10-CM | POA: Diagnosis not present

## 2020-12-03 DIAGNOSIS — Z9181 History of falling: Secondary | ICD-10-CM | POA: Diagnosis not present

## 2020-12-03 DIAGNOSIS — Z87891 Personal history of nicotine dependence: Secondary | ICD-10-CM | POA: Diagnosis not present

## 2020-12-03 DIAGNOSIS — Z79899 Other long term (current) drug therapy: Secondary | ICD-10-CM | POA: Insufficient documentation

## 2020-12-03 NOTE — Progress Notes (Signed)
Pt states his eyes itch a lot, feels stange has been going on "for a little while" not able to give me an estimated time.

## 2020-12-04 ENCOUNTER — Encounter: Payer: Self-pay | Admitting: Hematology and Oncology

## 2020-12-04 DIAGNOSIS — R278 Other lack of coordination: Secondary | ICD-10-CM | POA: Diagnosis not present

## 2020-12-04 DIAGNOSIS — M47813 Spondylosis without myelopathy or radiculopathy, cervicothoracic region: Secondary | ICD-10-CM | POA: Diagnosis not present

## 2020-12-04 DIAGNOSIS — C3490 Malignant neoplasm of unspecified part of unspecified bronchus or lung: Secondary | ICD-10-CM | POA: Diagnosis not present

## 2020-12-04 DIAGNOSIS — I4891 Unspecified atrial fibrillation: Secondary | ICD-10-CM | POA: Diagnosis not present

## 2020-12-04 DIAGNOSIS — M50322 Other cervical disc degeneration at C5-C6 level: Secondary | ICD-10-CM | POA: Diagnosis not present

## 2020-12-04 DIAGNOSIS — Z7901 Long term (current) use of anticoagulants: Secondary | ICD-10-CM | POA: Diagnosis not present

## 2020-12-04 DIAGNOSIS — Z87891 Personal history of nicotine dependence: Secondary | ICD-10-CM | POA: Diagnosis not present

## 2020-12-04 DIAGNOSIS — Z9181 History of falling: Secondary | ICD-10-CM | POA: Diagnosis not present

## 2020-12-04 DIAGNOSIS — M4802 Spinal stenosis, cervical region: Secondary | ICD-10-CM | POA: Diagnosis not present

## 2020-12-04 DIAGNOSIS — G479 Sleep disorder, unspecified: Secondary | ICD-10-CM | POA: Diagnosis not present

## 2020-12-04 DIAGNOSIS — M25522 Pain in left elbow: Secondary | ICD-10-CM | POA: Diagnosis not present

## 2020-12-04 NOTE — Progress Notes (Signed)
[facility]  [address], [address] [place], [place] [address] Phone: [phone]  Fax: [phone]   Clinic Day:  12/04/20  Chief Complaint: Scott Jordan is a 70 y.o. male presents for follow up of extensive stage small cell lung cancer   PERTINENT ONCOLOGY HISTORY Scott Jordan is a 70 y.o.[place] who has above oncology history reviewed by me today presented for follow up visit for management of extensive small cell lung cancer Patient previously followed up by Dr.Corcoran, patient switched care to me on 08/27/20 Extensive medical records review was performed by me.  He has a > 40 pack year smoking.  He presented with a recurrent left sided pleural effusion.  He has undergone thoracentesis x 2 in the past month.  He is s/p pigtail drain.  Initial cytology x 2 was negative (lymphocytic exudative effusion).  Pleural fluid cytology on 08/18/2019 confirmed small cell lung cancer.     08/19/2019 Chest CT without contrast on revealed the left pleural pigtail drain.  There was a small residual left hydropneumothorax.  There was a 6.7 x 4 cm medial basilar left lower lobe lung mass suspicious for primary bronchogenic carcinoma.  There was a lingular 1.0 cm solid pulmonary nodule suspicious for ipsilateral metastasis.  There was intralobular septal thickening and patchy groundglass opacity throughout the lingula and the left lower lobe with moderate residual atelectasis of the left lung base.  A component of lymphangitic carcinomatosis was suspected.  There was ipsilateral infra hilar, subcarinal, AP window, prevascular and upper retroperitoneal adenopathy c/w metastatic disease.   05/12/2021Brain MRI on  revealed no evidence of metastatic disease.   08/29/2019 PET scan on  revealed markedly hypermetabolic left lower lobe pulmonary lesion (SUV 7.9) hypermetabolic nodal metastases in the mediastinum and left hilum with metastatic involvement of left pleural space. Additional  sites included upper abdominal hypermetabolic lymphadenopathy and scattered hypermetabolic bone metastases (left acetabulum, sacrum, and L5). There was interval decrease in left pleural effusion with areas of loculation.  CEA was 3.7 on 09/02/2019, 3.8 on 02/17/2020 and 3.1 on 05/13/2020.  LDH was 206 on 09/23/2019, 144 on 10/16/2019 and 102 on 05/13/2020.   09/02/2019- 11/11/2019 He is s/p 4 cycles of carboplatin, etoposide, and Tecentriq with Udencya support.   10/11/2019: CT chest abdomen pelvis showed reduction of chest and abdominal lymphadenopathy.  Pleural nodularity improved. 11/22/2019 - 11/25/2019 admission [facility] with a heroin overdose and aspiration pneumonia.  He received 1 unit of PRBCs.  He was treated with Zosyn.  He was discharged on Augmentin x5 days. 12/02/2019 - 02/17/2020  maintenance Tecentriq 04/22/2020 - present  maintenance Tecentriq 01/09/2020 - 01/22/2020 He received PCI  03/10/2020: Brain MRI no metastasis 03/10/2020  Cervical spine MRI revealed no evidence of metastatic disease to the cervical spine.  Multilevel DJD 03/19/2020:  PET scan mild patchy left lower lobe opacity, stable versus mildly improved 06/24/2020 Head CT without contrast: No mets 06/23/2020: CT chest abdomen pelvis: new 0.6 x 0.4 cm pleural-based nodule in the left upper lobe  06/24/2020: Head CT without contrast no metastasis.   Other chronic medical problems.  hyponatremia secondary to SIADH.  He began sodium chloride tablets (1 gm TID) on 09/04/2019.   B12 deficiency.  B12 was 227 on 03/23/2020 and 646 on 05/13/2020.   He is on oral B12.  Folate was 19.2 on 04/22/2020.   left upper extremity symptoms c/w a left-sided C6 radiculopathy as well as ulnar neuropathy.   Hepatitis C antibody and hepatitis B core  antibody total were positive on 12/23/2019.  Hepatitis B surface antigen, hepatitis B core IgM and hepatitis A IgM were negative.  Hepatitis B DNA was not detected. Hepatitis C RNA quantitative  revealed 3.869 log 10 IU/mL (7400 IU/mL).  Hepatitis C genotype is 2b. He sees gastroenterology for Hep C treatment  # Hypertension: follows up with Dr. Ubaldo Glassing and was recently seen and was recommended to continue blood pressure meds.  #  Left C6 radiculopathy and ulnar neuropathy likely secondary to nerve entrapment at the elbow. Right MRI on 03/10/2020 revealed no intracranial metastasis.  Cervical spine MRI confirmed no metastasis, severe DJD, bilateral neuroforaminal narrowing.followe dby  Dr. Donneta Romberg and Dr. Cari Caraway.  # 10/30/2020 CT chest without contrast showed marked interval progression of peripheral left upper lobe pulmonary nodule, 13 x 8 mm, compared to previous 6 x 4 mm.  Suspecting malignancy.  Stable appearance of posterior left lower lobe consolidative opacity.  7 mm short axis AP window node is mildly progressive.  INTERVAL HISTORY Scott Jordan is a 70 y.o. male who has above history reviewed by me today presents for follow up visit for treatment for his extensive small cell lung cancer. Problems and complaints are listed below: Patient has been on maintenance Tecentriq.   He has no new complaint.  Denies nausea vomiting diarrhea, fever, shortness of breath, chest pain, diarrhea     Past Medical History:  Diagnosis Date   A-fib ([place]) 01/10/2019   pt st this was dx by Dr. Ubaldo Glassing   Cancer [facility]) 3500   LUNG   Complication of anesthesia    DIFFICULTY WAKING UP AFTER SURGERY- 20 YRS AGO   Hypertension    Lung cancer ([place])    Substance abuse ([place])     Past Surgical History:  Procedure Laterality Date   ARM DEBRIDEMENT Left    INCISION AND DEBRIDEMENT LOWER ARM -20 YRS AGO   BACK SURGERY     PORTACATH PLACEMENT Left 08/29/2019   Procedure: INSERTION PORT-A-CATH;  Surgeon: Nestor Lewandowsky, MD;  Location: [facility] ORS;  Service: General;  Laterality: Left;    History reviewed. No pertinent family history.  Social History:  reports that he has quit smoking. His smoking use  included cigarettes. He smoked an average of .5 packs per day. He has never used smokeless tobacco. He reports current alcohol use. He reports that he does not currently use drugs after having used the following drugs: Heroin. The patient denies any exposure to radiation or toxins.  The patient lives in [place]. He is dependent on public transportation or transportation from friends/family. He has family friends staying with him to help care for his needs. His niece, Scott Jordan [phone]), is his medical power of attorney. The patient is alone today.   Allergies: No Known Allergies  Current Medications: Current Outpatient Medications  Medication Sig Dispense Refill   diltiazem (TIAZAC) 240 MG 24 hr capsule Take 1 capsule by mouth daily.     ELIQUIS 5 MG TABS tablet Take 5 mg by mouth 2 (two) times daily.     gabapentin (NEURONTIN) 300 MG capsule Take 1 capsule by mouth 3 (three) times daily.     lidocaine-prilocaine (EMLA) cream Apply to affected area once 30 g 3   magnesium chloride (SLOW-MAG) 64 MG TBEC SR tablet Take 1 tablet (64 mg total) by mouth daily. 30 tablet 1   magnesium oxide (MAG-OX) 400 MG tablet Take by mouth.     potassium chloride SA (KLOR-CON) 20 MEQ tablet TAKE  1 TABLET BY MOUTH DAILY FOR 3 DAYS 3 tablet 0   diltiazem (CARDIZEM) 30 MG tablet Take by mouth. (Patient not taking: No sig reported)     feeding supplement, ENSURE ENLIVE, (ENSURE ENLIVE) LIQD Take 237 mLs by mouth 3 (three) times daily between meals. (Patient not taking: No sig reported) 237 mL 12   MAVYRET 100-40 MG TABS Take 3 tablets by mouth daily. (Patient not taking: No sig reported)     mirtazapine (REMERON) 15 MG tablet Take by mouth. (Patient not taking: No sig reported)     naloxone (NARCAN) nasal spray 4 mg/0.1 mL once as needed (Patient not taking: No sig reported)     ondansetron (ZOFRAN) 8 MG tablet Take 1 tablet (8 mg total) by mouth every 8 (eight) hours as needed for refractory nausea / vomiting.  Start on day 3 after carboplatin chemo. (Patient not taking: No sig reported) 30 tablet 1   No current facility-administered medications for this visit.   Facility-Administered Medications Ordered in Other Visits  Medication Dose Route Frequency Provider Last Rate Last Admin   heparin lock flush 100 unit/mL  500 Units Intravenous Once Corcoran, Melissa C, MD       sodium chloride flush (NS) 0.9 % injection 10 mL  10 mL Intravenous PRN Lequita Asal, MD   10 mL at 11/11/19 4128    Review of Systems  Constitutional:  Negative for chills, fever, malaise/fatigue and weight loss.  HENT:  Negative for sore throat.   Eyes:  Negative for redness.  Respiratory:  Positive for sputum production and shortness of breath. Negative for cough and wheezing.   Cardiovascular:  Negative for chest pain, palpitations and leg swelling.  Gastrointestinal:  Negative for abdominal pain, blood in stool, nausea and vomiting.  Genitourinary:  Negative for dysuria.  Musculoskeletal:  Positive for joint pain. Negative for myalgias.  Skin:  Negative for rash.  Neurological:  Negative for dizziness, tingling and tremors.  Endo/Heme/Allergies:  Does not bruise/bleed easily.  Psychiatric/Behavioral:  Negative for hallucinations.   Performance status (ECOG): 1  Vitals Blood pressure (!) 145/109, pulse 89, temperature 98 F (36.7 C), resp. rate 16, weight 135 lb 2.3 oz (61.3 kg), SpO2 100 %.   Physical Exam Constitutional:      General: He is not in acute distress. HENT:     Head: Normocephalic and atraumatic.  Eyes:     General: No scleral icterus. Cardiovascular:     Rate and Rhythm: Normal rate and regular rhythm.     Heart sounds: Normal heart sounds.  Pulmonary:     Effort: Pulmonary effort is normal. No respiratory distress.     Breath sounds: No wheezing.     Comments: Bilateral decreased breath sound Abdominal:     General: Bowel sounds are normal. There is no distension.     Palpations:  Abdomen is soft.  Musculoskeletal:        General: No deformity. Normal range of motion.     Cervical back: Normal range of motion and neck supple.  Skin:    General: Skin is warm and dry.     Findings: No erythema or rash.  Neurological:     Mental Status: He is alert and oriented to person, place, and time. Mental status is at baseline.     Cranial Nerves: No cranial nerve deficit.     Coordination: Coordination normal.  Psychiatric:        Mood and Affect: Mood normal.    Imaging  studies:  No visits with results within 3 Day(s) from this visit.  Latest known visit with results is:  Hospital Outpatient Visit on 11/19/2020  Component Date Value Ref Range Status   Glucose-Capillary 11/19/2020 101 (A) 70 - 99 mg/dL Final   Glucose reference range applies only to samples taken after fasting for at least 8 hours.    Assessment:  KERRIGAN GLENDENING is a 70 y.o. male with extensive stage small cell lung cancer.   No diagnosis found.   #Extensive small cell lung cancer, s/p 4 cycles of carboplatin, etoposide and Tecentriq-  Currently on Tecentriq maintenance. 10/30/2020 CT scan showed progression of the left upper lobe pleural-based nodule. 11/19/2020 PET scan showed 13 mm peripheral left upper lobe nodule which shows low level of hypermetabolism.  SUV 2.4 Second hypermetabolic nodule identified in the deeper posterior left costophrenic sulcus, SUV 5.5. I discussed this case with pulmonology Dr. Patsey Berthold as well as radiology Dr. Kathlene Cote.  We all agree that this is consistent with disease recurrence.  This area is right above the diaphragm and biopsy is difficult. IR is willing to give a try if needed.   12/04/2020, I called patient over the phone and discussed with him.  Shared decision was made not to proceed with a biopsy at this point, he agrees with systemic chemotherapy.  Given that this is a late relapse, about 12 months since his prior platinum chemotherapy, I recommend  carboplatin/etoposide/durvalumab.  He agrees.   # History of atrial fibrillation/flutter: Continue Eliquis 5 mg twice daily. # B12 deficiency B12 was 227 on 03/23/2020 and 646 on 05/13/2020.  Continue oral vitamin B12 # Hypomagnesia;   Recommend patient to restart Slow-Mag.  Prescription sent.  # Hepatitis C,  [place]     RTC 1 week to start treatment  I discussed the assessment and treatment plan with the patient.  The patient was provided an opportunity to ask questions and all were answered.  The patient agreed with the plan and demonstrated an understanding of the instructions.  The patient was advised to call back if the symptoms worsen or if the condition fails to improve as anticipated.   Earlie Server, MD, PhD Hematology Oncology [place] at [facility] 12/04/2020

## 2020-12-07 ENCOUNTER — Encounter: Payer: Self-pay | Admitting: Hematology and Oncology

## 2020-12-08 ENCOUNTER — Encounter: Payer: Self-pay | Admitting: Oncology

## 2020-12-08 ENCOUNTER — Inpatient Hospital Stay: Payer: Medicare Other

## 2020-12-08 ENCOUNTER — Inpatient Hospital Stay: Payer: Medicare Other | Attending: Oncology | Admitting: Oncology

## 2020-12-08 ENCOUNTER — Other Ambulatory Visit: Payer: Self-pay

## 2020-12-08 VITALS — BP 144/98 | HR 89 | Temp 96.5°F | Resp 18 | Wt 136.5 lb

## 2020-12-08 DIAGNOSIS — C3490 Malignant neoplasm of unspecified part of unspecified bronchus or lung: Secondary | ICD-10-CM

## 2020-12-08 DIAGNOSIS — D701 Agranulocytosis secondary to cancer chemotherapy: Secondary | ICD-10-CM | POA: Diagnosis not present

## 2020-12-08 DIAGNOSIS — E222 Syndrome of inappropriate secretion of antidiuretic hormone: Secondary | ICD-10-CM | POA: Insufficient documentation

## 2020-12-08 DIAGNOSIS — Z79899 Other long term (current) drug therapy: Secondary | ICD-10-CM | POA: Insufficient documentation

## 2020-12-08 DIAGNOSIS — I4891 Unspecified atrial fibrillation: Secondary | ICD-10-CM | POA: Insufficient documentation

## 2020-12-08 DIAGNOSIS — C3432 Malignant neoplasm of lower lobe, left bronchus or lung: Secondary | ICD-10-CM | POA: Diagnosis not present

## 2020-12-08 DIAGNOSIS — B182 Chronic viral hepatitis C: Secondary | ICD-10-CM

## 2020-12-08 DIAGNOSIS — Z5112 Encounter for antineoplastic immunotherapy: Secondary | ICD-10-CM | POA: Insufficient documentation

## 2020-12-08 DIAGNOSIS — Z5111 Encounter for antineoplastic chemotherapy: Secondary | ICD-10-CM | POA: Insufficient documentation

## 2020-12-08 DIAGNOSIS — E538 Deficiency of other specified B group vitamins: Secondary | ICD-10-CM | POA: Insufficient documentation

## 2020-12-08 DIAGNOSIS — Z7901 Long term (current) use of anticoagulants: Secondary | ICD-10-CM | POA: Diagnosis not present

## 2020-12-08 DIAGNOSIS — I10 Essential (primary) hypertension: Secondary | ICD-10-CM | POA: Insufficient documentation

## 2020-12-08 DIAGNOSIS — T451X5A Adverse effect of antineoplastic and immunosuppressive drugs, initial encounter: Secondary | ICD-10-CM | POA: Diagnosis not present

## 2020-12-08 LAB — CBC WITH DIFFERENTIAL/PLATELET
Abs Immature Granulocytes: 0.08 10*3/uL — ABNORMAL HIGH (ref 0.00–0.07)
Basophils Absolute: 0 10*3/uL (ref 0.0–0.1)
Basophils Relative: 0 %
Eosinophils Absolute: 0 10*3/uL (ref 0.0–0.5)
Eosinophils Relative: 0 %
HCT: 38.2 % — ABNORMAL LOW (ref 39.0–52.0)
Hemoglobin: 13.4 g/dL (ref 13.0–17.0)
Immature Granulocytes: 1 %
Lymphocytes Relative: 27 %
Lymphs Abs: 1.7 10*3/uL (ref 0.7–4.0)
MCH: 34.1 pg — ABNORMAL HIGH (ref 26.0–34.0)
MCHC: 35.1 g/dL (ref 30.0–36.0)
MCV: 97.2 fL (ref 80.0–100.0)
Monocytes Absolute: 0.7 10*3/uL (ref 0.1–1.0)
Monocytes Relative: 11 %
Neutro Abs: 4 10*3/uL (ref 1.7–7.7)
Neutrophils Relative %: 61 %
Platelets: 198 10*3/uL (ref 150–400)
RBC: 3.93 MIL/uL — ABNORMAL LOW (ref 4.22–5.81)
RDW: 12.9 % (ref 11.5–15.5)
WBC: 6.5 10*3/uL (ref 4.0–10.5)
nRBC: 0 % (ref 0.0–0.2)

## 2020-12-08 LAB — COMPREHENSIVE METABOLIC PANEL
ALT: 10 U/L (ref 0–44)
AST: 15 U/L (ref 15–41)
Albumin: 3.9 g/dL (ref 3.5–5.0)
Alkaline Phosphatase: 78 U/L (ref 38–126)
Anion gap: 7 (ref 5–15)
BUN: 18 mg/dL (ref 8–23)
CO2: 25 mmol/L (ref 22–32)
Calcium: 9.4 mg/dL (ref 8.9–10.3)
Chloride: 105 mmol/L (ref 98–111)
Creatinine, Ser: 0.89 mg/dL (ref 0.61–1.24)
GFR, Estimated: >60 mL/min (ref >60)
Glucose, Bld: 113 mg/dL — ABNORMAL HIGH (ref 70–99)
Potassium: 3.7 mmol/L (ref 3.5–5.1)
Sodium: 137 mmol/L (ref 135–145)
Total Bilirubin: 1 mg/dL (ref 0.3–1.2)
Total Protein: 7.4 g/dL (ref 6.5–8.1)

## 2020-12-08 LAB — LACTATE DEHYDROGENASE: LDH: 111 U/L (ref 98–192)

## 2020-12-08 MED ORDER — SODIUM CHLORIDE 0.9 % IV SOLN
Freq: Once | INTRAVENOUS | Status: AC
  Filled 2020-12-08: qty 250

## 2020-12-08 MED ORDER — SODIUM CHLORIDE 0.9 % IV SOLN
100.0000 mg/m2 | Freq: Once | INTRAVENOUS | Status: AC
  Administered 2020-12-08: 180 mg via INTRAVENOUS
  Filled 2020-12-08: qty 9

## 2020-12-08 MED ORDER — SODIUM CHLORIDE 0.9 % IV SOLN
435.0000 mg | Freq: Once | INTRAVENOUS | Status: AC
  Administered 2020-12-08: 440 mg via INTRAVENOUS
  Filled 2020-12-08: qty 44

## 2020-12-08 MED ORDER — HEPARIN SOD (PORK) LOCK FLUSH 100 UNIT/ML IV SOLN
INTRAVENOUS | Status: AC
  Administered 2020-12-08: 500 [IU]
  Filled 2020-12-08: qty 5

## 2020-12-08 MED ORDER — SODIUM CHLORIDE 0.9 % IV SOLN
150.0000 mg | Freq: Once | INTRAVENOUS | Status: AC
  Administered 2020-12-08: 150 mg via INTRAVENOUS
  Filled 2020-12-08: qty 150

## 2020-12-08 MED ORDER — PROCHLORPERAZINE MALEATE 10 MG PO TABS
10.0000 mg | ORAL_TABLET | Freq: Four times a day (QID) | ORAL | 0 refills | Status: AC | PRN
Start: 2020-12-08 — End: ?

## 2020-12-08 MED ORDER — PALONOSETRON HCL INJECTION 0.25 MG/5ML
0.2500 mg | Freq: Once | INTRAVENOUS | Status: AC
Start: 2020-12-08 — End: 2020-12-08
  Administered 2020-12-08: 0.25 mg via INTRAVENOUS
  Filled 2020-12-08: qty 5

## 2020-12-08 MED ORDER — SODIUM CHLORIDE 0.9 % IV SOLN
10.0000 mg | Freq: Once | INTRAVENOUS | Status: AC
  Administered 2020-12-08: 10 mg via INTRAVENOUS
  Filled 2020-12-08: qty 10

## 2020-12-08 NOTE — Progress Notes (Signed)
Patient here for follow up. Pt reports that he continues to have pain 8/10 to left shoulder, arm and hand.

## 2020-12-08 NOTE — Progress Notes (Signed)
[facility]  [address] [place], [place] [address] Phone: [phone]  Fax: [phone]   Clinic Day:  12/08/20  Chief Complaint: Scott Jordan is a 70 y.o. male presents for follow up of extensive stage small cell lung cancer   PERTINENT ONCOLOGY HISTORY Scott Jordan is a 70 y.o.[place] who has above oncology history reviewed by me today presented for follow up visit for management of extensive small cell lung cancer Patient previously followed up by Dr.Corcoran, patient switched care to me on 08/27/20 Extensive medical records review was performed by me.  He has a > 40 pack year smoking.  He presented with a recurrent left sided pleural effusion.  He has undergone thoracentesis x 2 in the past month.  He is s/p pigtail drain.  Initial cytology x 2 was negative (lymphocytic exudative effusion).  Pleural fluid cytology on 08/18/2019 confirmed small cell lung cancer.     08/19/2019 Chest CT without contrast on revealed the left pleural pigtail drain.  There was a small residual left hydropneumothorax.  There was a 6.7 x 4 cm medial basilar left lower lobe lung mass suspicious for primary bronchogenic carcinoma.  There was a lingular 1.0 cm solid pulmonary nodule suspicious for ipsilateral metastasis.  There was intralobular septal thickening and patchy groundglass opacity throughout the lingula and the left lower lobe with moderate residual atelectasis of the left lung base.  A component of lymphangitic carcinomatosis was suspected.  There was ipsilateral infra hilar, subcarinal, AP window, prevascular and upper retroperitoneal adenopathy c/w metastatic disease.   05/12/2021Brain MRI on  revealed no evidence of metastatic disease.   08/29/2019 PET scan on  revealed markedly hypermetabolic left lower lobe pulmonary lesion (SUV 7.9) hypermetabolic nodal metastases in the mediastinum and left hilum with metastatic involvement of left pleural space. Additional  sites included upper abdominal hypermetabolic lymphadenopathy and scattered hypermetabolic bone metastases (left acetabulum, sacrum, and L5). There was interval decrease in left pleural effusion with areas of loculation.  CEA was 3.7 on 09/02/2019, 3.8 on 02/17/2020 and 3.1 on 05/13/2020.  LDH was 206 on 09/23/2019, 144 on 10/16/2019 and 102 on 05/13/2020.   09/02/2019- 11/11/2019 He is s/p 4 cycles of carboplatin, etoposide, and Tecentriq with Udencya support.   10/11/2019: CT chest abdomen pelvis showed reduction of chest and abdominal lymphadenopathy.  Pleural nodularity improved. 11/22/2019 - 11/25/2019 admission [facility] with a heroin overdose and aspiration pneumonia.  He received 1 unit of PRBCs.  He was treated with Zosyn.  He was discharged on Augmentin x5 days. 12/02/2019 - 02/17/2020  maintenance Tecentriq 04/22/2020 - present  maintenance Tecentriq 01/09/2020 - 01/22/2020 He received PCI  03/10/2020: Brain MRI no metastasis 03/10/2020  Cervical spine MRI revealed no evidence of metastatic disease to the cervical spine.  Multilevel DJD 03/19/2020:  PET scan mild patchy left lower lobe opacity, stable versus mildly improved 06/24/2020 Head CT without contrast: No mets 06/23/2020: CT chest abdomen pelvis: new 0.6 x 0.4 cm pleural-based nodule in the left upper lobe  06/24/2020: Head CT without contrast no metastasis.   Other chronic medical problems.  hyponatremia secondary to SIADH.  He began sodium chloride tablets (1 gm TID) on 09/04/2019.   B12 deficiency.  B12 was 227 on 03/23/2020 and 646 on 05/13/2020.   He is on oral B12.  Folate was 19.2 on 04/22/2020.   left upper extremity symptoms c/w a left-sided C6 radiculopathy as well as ulnar neuropathy.   Hepatitis C antibody and hepatitis B core  antibody total were positive on 12/23/2019.  Hepatitis B surface antigen, hepatitis B core IgM and hepatitis A IgM were negative.  Hepatitis B DNA was not detected. Hepatitis C RNA quantitative  revealed 3.869 log 10 IU/mL (7400 IU/mL).  Hepatitis C genotype is 2b. He sees gastroenterology for Hep C treatment  # Hypertension: follows up with Dr. Ubaldo Glassing and was recently seen and was recommended to continue blood pressure meds.  #  Left C6 radiculopathy and ulnar neuropathy likely secondary to nerve entrapment at the elbow. Right MRI on 03/10/2020 revealed no intracranial metastasis.  Cervical spine MRI confirmed no metastasis, severe DJD, bilateral neuroforaminal narrowing.followe dby  Dr. Donneta Romberg and Dr. Cari Caraway.  # 10/30/2020 CT chest without contrast showed marked interval progression of peripheral left upper lobe pulmonary nodule, 13 x 8 mm, compared to previous 6 x 4 mm.  Suspecting malignancy.  Stable appearance of posterior left lower lobe consolidative opacity.  7 mm short axis AP window node is mildly progressive.  11/19/2020 PET scan showed 13 mm peripheral left upper lobe nodule which shows low level of hypermetabolism.  SUV 2.4, Second hypermetabolic nodule identified in the deeper posterior left costophrenic sulcus, SUV 5.5. I discussed this case with pulmonology Dr. Patsey Berthold as well as radiology Dr. Kathlene Cote.  We all agree that this is consistent with disease recurrence.  This area is right above the diaphragm and biopsy is difficult. IR is willing to give a try if needed.   12/04/2020, I called patient over the phone and discussed with him about available options.  Shared decision was made not to proceed with a biopsy at this point, he agrees with systemic chemotherapy.  Given that this is a late relapse, about 12 months since his prior platinum chemotherapy, I recommend carboplatin/etoposide/durvalumab.  He agrees.  INTERVAL HISTORY Scott Jordan is a 70 y.o. male who has above history reviewed by me today presents for follow up visit for treatment for his recurrent extensive small cell lung cancer. Problems and complaints are listed below: Patient presents for evaluation prior to  chemotherapy.  No new concerns.  Chronic arthralgia Denies nausea vomiting diarrhea, fever, shortness of breath, chest pain, diarrhea     Past Medical History:  Diagnosis Date   A-fib ([place]) 01/10/2019   pt st this was dx by Dr. Ubaldo Glassing   Cancer [facility]) [id number]   LUNG   Complication of anesthesia    DIFFICULTY WAKING UP AFTER SURGERY- 20 YRS AGO   Hypertension    Lung cancer ([place])    Substance abuse ([place])     Past Surgical History:  Procedure Laterality Date   ARM DEBRIDEMENT Left    INCISION AND DEBRIDEMENT LOWER ARM -20 YRS AGO   BACK SURGERY     PORTACATH PLACEMENT Left 08/29/2019   Procedure: INSERTION PORT-A-CATH;  Surgeon: Nestor Lewandowsky, MD;  Location: [facility] ORS;  Service: General;  Laterality: Left;    History reviewed. No pertinent family history.  Social History:  reports that he has quit smoking. His smoking use included cigarettes. He smoked an average of .5 packs per day. He has never used smokeless tobacco. He reports current alcohol use. He reports that he does not currently use drugs after having used the following drugs: Heroin. The patient denies any exposure to radiation or toxins.  The patient lives in [place]. He is dependent on public transportation or transportation from friends/family. He has family friends staying with him to help care for his needs. His niece, [name] [phone]), is  his medical power of attorney. The patient is alone today.   Allergies: No Known Allergies  Current Medications: Current Outpatient Medications  Medication Sig Dispense Refill   diltiazem (TIAZAC) 240 MG 24 hr capsule Take 1 capsule by mouth daily.     ELIQUIS 5 MG TABS tablet Take 5 mg by mouth 2 (two) times daily.     feeding supplement, ENSURE ENLIVE, (ENSURE ENLIVE) LIQD Take 237 mLs by mouth 3 (three) times daily between meals. 237 mL 12   magnesium oxide (MAG-OX) 400 MG tablet Take by mouth.     potassium chloride SA (KLOR-CON) 20 MEQ tablet TAKE 1 TABLET BY MOUTH  DAILY FOR 3 DAYS 3 tablet 0   prochlorperazine (COMPAZINE) 10 MG tablet Take 1 tablet (10 mg total) by mouth every 6 (six) hours as needed for nausea or vomiting. 90 tablet 0   lidocaine-prilocaine (EMLA) cream Apply to affected area once (Patient not taking: Reported on 12/08/2020) 30 g 3   MAVYRET 100-40 MG TABS Take 3 tablets by mouth daily. (Patient not taking: No sig reported)     mirtazapine (REMERON) 15 MG tablet Take by mouth. (Patient not taking: No sig reported)     naloxone (NARCAN) nasal spray 4 mg/0.1 mL once as needed (Patient not taking: No sig reported)     ondansetron (ZOFRAN) 8 MG tablet Take 1 tablet (8 mg total) by mouth every 8 (eight) hours as needed for refractory nausea / vomiting. Start on day 3 after carboplatin chemo. (Patient not taking: No sig reported) 30 tablet 1   No current facility-administered medications for this visit.   Facility-Administered Medications Ordered in Other Visits  Medication Dose Route Frequency Provider Last Rate Last Admin   heparin lock flush 100 UNIT/ML injection            heparin lock flush 100 unit/mL  500 Units Intravenous Once Corcoran, Melissa C, MD       sodium chloride flush (NS) 0.9 % injection 10 mL  10 mL Intravenous PRN Lequita Asal, MD   10 mL at 11/11/19 4132    Review of Systems  Constitutional:  Negative for chills, fever, malaise/fatigue and weight loss.  HENT:  Negative for sore throat.   Eyes:  Negative for redness.  Respiratory:  Positive for sputum production and shortness of breath. Negative for cough and wheezing.   Cardiovascular:  Negative for chest pain, palpitations and leg swelling.  Gastrointestinal:  Negative for abdominal pain, blood in stool, nausea and vomiting.  Genitourinary:  Negative for dysuria.  Musculoskeletal:  Positive for joint pain. Negative for myalgias.  Skin:  Negative for rash.  Neurological:  Negative for dizziness, tingling and tremors.  Endo/Heme/Allergies:  Does not  bruise/bleed easily.  Psychiatric/Behavioral:  Negative for hallucinations.   Performance status (ECOG): 1  Vitals Blood pressure (!) 144/98, pulse 89, temperature (!) 96.5 F (35.8 C), resp. rate 18, weight 136 lb 8 oz (61.9 kg), SpO2 100 %.   Physical Exam Constitutional:      General: He is not in acute distress. HENT:     Head: Normocephalic and atraumatic.  Eyes:     General: No scleral icterus. Cardiovascular:     Rate and Rhythm: Normal rate and regular rhythm.     Heart sounds: Normal heart sounds.  Pulmonary:     Effort: Pulmonary effort is normal. No respiratory distress.     Breath sounds: No wheezing.     Comments: Bilateral decreased breath sound Abdominal:  General: Bowel sounds are normal. There is no distension.     Palpations: Abdomen is soft.  Musculoskeletal:        General: No deformity. Normal range of motion.     Cervical back: Normal range of motion and neck supple.  Skin:    General: Skin is warm and dry.     Findings: No erythema or rash.  Neurological:     Mental Status: He is alert and oriented to person, place, and time. Mental status is at baseline.     Cranial Nerves: No cranial nerve deficit.     Coordination: Coordination normal.  Psychiatric:        Mood and Affect: Mood normal.    Imaging studies:  Infusion on 12/08/2020  Component Date Value Ref Range Status   LDH 12/08/2020 111  98 - 192 U/L Final   Performed at [facility], [address] [place], [place] [address]   WBC 12/08/2020 6.5  4.0 - 10.5 K/uL Final   RBC 12/08/2020 3.93 (A) 4.22 - 5.81 MIL/uL Final   Hemoglobin 12/08/2020 13.4  13.0 - 17.0 g/dL Final   HCT 12/08/2020 38.2 (A) 39.0 - 52.0 % Final   MCV 12/08/2020 97.2  80.0 - 100.0 fL Final   MCH 12/08/2020 34.1 (A) 26.0 - 34.0 pg Final   MCHC 12/08/2020 35.1  30.0 - 36.0 g/dL Final   RDW 12/08/2020 12.9  11.5 - 15.5 % Final   Platelets 12/08/2020 198  150 - 400 K/uL Final   nRBC 12/08/2020 0.0  0.0 - 0.2 %  Final   Neutrophils Relative % 12/08/2020 61  % Final   Neutro Abs 12/08/2020 4.0  1.7 - 7.7 K/uL Final   Lymphocytes Relative 12/08/2020 27  % Final   Lymphs Abs 12/08/2020 1.7  0.7 - 4.0 K/uL Final   Monocytes Relative 12/08/2020 11  % Final   Monocytes Absolute 12/08/2020 0.7  0.1 - 1.0 K/uL Final   Eosinophils Relative 12/08/2020 0  % Final   Eosinophils Absolute 12/08/2020 0.0  0.0 - 0.5 K/uL Final   Basophils Relative 12/08/2020 0  % Final   Basophils Absolute 12/08/2020 0.0  0.0 - 0.1 K/uL Final   Immature Granulocytes 12/08/2020 1  % Final   Abs Immature Granulocytes 12/08/2020 0.08 (A) 0.00 - 0.07 K/uL Final   Performed at [facility], [address] [place], [place] [address]   Sodium 12/08/2020 137  135 - 145 mmol/L Final   Potassium 12/08/2020 3.7  3.5 - 5.1 mmol/L Final   Chloride 12/08/2020 105  98 - 111 mmol/L Final   CO2 12/08/2020 25  22 - 32 mmol/L Final   Glucose, Bld 12/08/2020 113 (A) 70 - 99 mg/dL Final   Glucose reference range applies only to samples taken after fasting for at least 8 hours.   BUN 12/08/2020 18  8 - 23 mg/dL Final   Creatinine, Ser 12/08/2020 0.89  0.61 - 1.24 mg/dL Final   Calcium 12/08/2020 9.4  8.9 - 10.3 mg/dL Final   Total Protein 12/08/2020 7.4  6.5 - 8.1 g/dL Final   Albumin 12/08/2020 3.9  3.5 - 5.0 g/dL Final   AST 12/08/2020 15  15 - 41 U/L Final   ALT 12/08/2020 10  0 - 44 U/L Final   Alkaline Phosphatase 12/08/2020 78  38 - 126 U/L Final   Total Bilirubin 12/08/2020 1.0  0.3 - 1.2 mg/dL Final   GFR, Estimated 12/08/2020 >60  >60 mL/min Final  Comment: (NOTE) Calculated using the CKD-EPI Creatinine Equation (2021)    Anion gap 12/08/2020 7  5 - 15 Final   Performed at [facility], [address] [place], [place] [address]    Assessment:  Scott Jordan is a 70 y.o. male with extensive stage small cell lung cancer.   1. Small cell lung cancer ([place])   2. Encounter for antineoplastic immunotherapy   3. Encounter  for antineoplastic chemotherapy   4. Chronic hepatitis C without hepatic coma ([place])   5. B12 deficiency   6. Chemotherapy induced neutropenia ([facility])      #Extensive small cell lung cancer, s/p 4 cycles of carboplatin, etoposide and Tecentriq-  - recurrence/disease progression.  Labs are reviewed and discussed with patient. Will retreat with with Carboplatin/Etoposide, continue Tecentriq.  Patient previously developed chemotherapy-induced neutropenia and was given Udenyca. Plan to provide him G-CSF support with Neulasta-denied by insurance.  # History of atrial fibrillation/flutter: Continue Eliquis 5 mg twice daily. # B12 deficiency B12 was 227 on 03/23/2020 and 646 on 05/13/2020.  Unclear if patient is still on oral vitamin B12 supplementation or not.  Check B12 for next visit. # Hypomagnesia; I have recommended patient to start Slow-Mag.  Instead patient is taking magnesium oxide 400 mg.  Denies any diarrhea.  We will continue.  # Hepatitis C,  [place]     RTC 1 week for evaluation of tolerability of treatments.  Monitor blood counts.  I discussed the assessment and treatment plan with the patient.  The patient was provided an opportunity to ask questions and all were answered.  The patient agreed with the plan and demonstrated an understanding of the instructions.  The patient was advised to call back if the symptoms worsen or if the condition fails to improve as anticipated.   Earlie Server, MD, PhD Hematology Oncology [place] at [facility] 12/08/2020

## 2020-12-09 ENCOUNTER — Other Ambulatory Visit: Payer: Medicare Other

## 2020-12-09 ENCOUNTER — Inpatient Hospital Stay: Payer: Medicare Other

## 2020-12-09 ENCOUNTER — Ambulatory Visit: Payer: Medicare Other | Admitting: Oncology

## 2020-12-09 ENCOUNTER — Encounter: Payer: Self-pay | Admitting: Hematology and Oncology

## 2020-12-09 VITALS — BP 130/80 | HR 84 | Temp 95.3°F | Resp 20

## 2020-12-09 DIAGNOSIS — C3432 Malignant neoplasm of lower lobe, left bronchus or lung: Secondary | ICD-10-CM | POA: Diagnosis not present

## 2020-12-09 DIAGNOSIS — C3490 Malignant neoplasm of unspecified part of unspecified bronchus or lung: Secondary | ICD-10-CM

## 2020-12-09 DIAGNOSIS — Z7901 Long term (current) use of anticoagulants: Secondary | ICD-10-CM | POA: Diagnosis not present

## 2020-12-09 DIAGNOSIS — Z79899 Other long term (current) drug therapy: Secondary | ICD-10-CM | POA: Diagnosis not present

## 2020-12-09 DIAGNOSIS — Z5111 Encounter for antineoplastic chemotherapy: Secondary | ICD-10-CM | POA: Diagnosis not present

## 2020-12-09 DIAGNOSIS — I10 Essential (primary) hypertension: Secondary | ICD-10-CM | POA: Diagnosis not present

## 2020-12-09 DIAGNOSIS — E538 Deficiency of other specified B group vitamins: Secondary | ICD-10-CM | POA: Diagnosis not present

## 2020-12-09 DIAGNOSIS — Z5112 Encounter for antineoplastic immunotherapy: Secondary | ICD-10-CM | POA: Diagnosis not present

## 2020-12-09 DIAGNOSIS — D701 Agranulocytosis secondary to cancer chemotherapy: Secondary | ICD-10-CM | POA: Diagnosis not present

## 2020-12-09 DIAGNOSIS — T451X5A Adverse effect of antineoplastic and immunosuppressive drugs, initial encounter: Secondary | ICD-10-CM | POA: Diagnosis not present

## 2020-12-09 DIAGNOSIS — I4891 Unspecified atrial fibrillation: Secondary | ICD-10-CM | POA: Diagnosis not present

## 2020-12-09 DIAGNOSIS — E222 Syndrome of inappropriate secretion of antidiuretic hormone: Secondary | ICD-10-CM | POA: Diagnosis not present

## 2020-12-09 MED ORDER — HEPARIN SOD (PORK) LOCK FLUSH 100 UNIT/ML IV SOLN
500.0000 [IU] | Freq: Once | INTRAVENOUS | Status: AC | PRN
  Administered 2020-12-09: 500 [IU]
  Filled 2020-12-09: qty 5

## 2020-12-09 MED ORDER — SODIUM CHLORIDE 0.9 % IV SOLN
10.0000 mg | Freq: Once | INTRAVENOUS | Status: AC
  Administered 2020-12-09: 10 mg via INTRAVENOUS
  Filled 2020-12-09: qty 10

## 2020-12-09 MED ORDER — SODIUM CHLORIDE 0.9% FLUSH
10.0000 mL | INTRAVENOUS | Status: DC | PRN
  Administered 2020-12-09: 10 mL
  Filled 2020-12-09: qty 10

## 2020-12-09 MED ORDER — SODIUM CHLORIDE 0.9 % IV SOLN
1200.0000 mg | Freq: Once | INTRAVENOUS | Status: AC
  Administered 2020-12-09: 1200 mg via INTRAVENOUS
  Filled 2020-12-09: qty 20

## 2020-12-09 MED ORDER — SODIUM CHLORIDE 0.9 % IV SOLN
Freq: Once | INTRAVENOUS | Status: AC
  Filled 2020-12-09: qty 250

## 2020-12-09 MED ORDER — SODIUM CHLORIDE 0.9 % IV SOLN
100.0000 mg/m2 | Freq: Once | INTRAVENOUS | Status: AC
  Administered 2020-12-09: 180 mg via INTRAVENOUS
  Filled 2020-12-09: qty 9

## 2020-12-09 MED ORDER — SODIUM CHLORIDE 0.9 % IV SOLN: 140.0000 mg | Freq: Once | INTRAVENOUS | Status: DC

## 2020-12-09 NOTE — Patient Instructions (Addendum)
[facility]COL[facility]  Discharge Instructions: Thank you for choosing [facility] to provide your oncology and hematology care.  If you have a lab appointment with the [facility], please go directly to the [facility] and check in at the registration area.  Wear comfortable clothing and clothing appropriate for easy access to any Portacath or PICC line.   We strive to give you quality time with your provider. You may need to reschedule your appointment if you arrive late (15 or more minutes).  Arriving late affects you and other patients whose appointments are after yours.  Also, if you miss three or more appointments without notifying the office, you may be dismissed from the clinic at the provider's discretion.      For prescription refill requests, have your pharmacy contact our office and allow 72 hours for refills to be completed.    Today you received the following chemotherapy and/or immunotherapy agents: Etoposide, Tecentriq      To help prevent nausea and vomiting after your treatment, we encourage you to take your nausea medication as directed.  BELOW ARE SYMPTOMS THAT SHOULD BE REPORTED IMMEDIATELY: *FEVER GREATER THAN 100.4 F (38 C) OR HIGHER *CHILLS OR SWEATING *NAUSEA AND VOMITING THAT IS NOT CONTROLLED WITH YOUR NAUSEA MEDICATION *UNUSUAL SHORTNESS OF BREATH *UNUSUAL BRUISING OR BLEEDING *URINARY PROBLEMS (pain or burning when urinating, or frequent urination) *BOWEL PROBLEMS (unusual diarrhea, constipation, pain near the anus) TENDERNESS IN MOUTH AND THROAT WITH OR WITHOUT PRESENCE OF ULCERS (sore throat, sores in mouth, or a toothache) UNUSUAL RASH, SWELLING OR PAIN  UNUSUAL VAGINAL DISCHARGE OR ITCHING   Items with * indicate a potential emergency and should be followed up as soon as possible or go to the Emergency Department if any problems should occur.  Please show the CHEMOTHERAPY ALERT CARD or IMMUNOTHERAPY ALERT CARD  at check-in to the Emergency Department and triage nurse.  Should you have questions after your visit or need to cancel or reschedule your appointment, please contact [facility]  [phone] and follow the prompts.  Office hours are 8:00 a.m. to 4:30 p.m. Monday - Friday. Please note that voicemails left after 4:00 p.m. may not be returned until the following business day.  We are closed weekends and major holidays. You have access to a nurse at all times for urgent questions. Please call the main number to the clinic [phone] and follow the prompts.  For any non-urgent questions, you may also contact your provider using MyChart. We now offer e-Visits for anyone 37 and older to request care online for non-urgent symptoms. For details visit [email or web address].   Also download the MyChart app! Go to the app store, search "MyChart", open the app, select , and log in with your MyChart username and password.  Due to Covid, a mask is required upon entering the hospital/clinic. If you do not have a mask, one will be given to you upon arrival. For doctor visits, patients may have 1 support person aged 59 or older with them. For treatment visits, patients cannot have anyone with them due to current Covid guidelines and our immunocompromised population.  Etoposide, VP-16 injection What is this medication? ETOPOSIDE, VP-16 (e toe POE side) is a chemotherapy drug. It is used to treat testicular cancer, lung cancer, and other cancers. This medicine may be used for other purposes; ask your health care provider or pharmacist if you have questions. COMMON BRAND NAME(S): Etopophos, Toposar, VePesid What  should I tell my care team before I take this medication? They need to know if you have any of these conditions: infection kidney disease liver disease low blood counts, like low white cell, platelet, or red cell counts an unusual or allergic reaction to  etoposide, other medicines, foods, dyes, or preservatives pregnant or trying to get pregnant breast-feeding How should I use this medication? This medicine is for infusion into a vein. It is administered in a hospital or clinic by a specially trained health care professional. Talk to your pediatrician regarding the use of this medicine in children. Special care may be needed. Overdosage: If you think you have taken too much of this medicine contact a poison control center or emergency room at once. NOTE: This medicine is only for you. Do not share this medicine with others. What if I miss a dose? It is important not to miss your dose. Call your doctor or health care professional if you are unable to keep an appointment. What may interact with this medication? This medicine may interact with the following medications: warfarin This list may not describe all possible interactions. Give your health care provider a list of all the medicines, herbs, non-prescription drugs, or dietary supplements you use. Also tell them if you smoke, drink alcohol, or use illegal drugs. Some items may interact with your medicine. What should I watch for while using this medication? Visit your doctor for checks on your progress. This drug may make you feel generally unwell. This is not uncommon, as chemotherapy can affect healthy cells as well as cancer cells. Report any side effects. Continue your course of treatment even though you feel ill unless your doctor tells you to stop. In some cases, you may be given additional medicines to help with side effects. Follow all directions for their use. Call your doctor or health care professional for advice if you get a fever, chills or sore throat, or other symptoms of a cold or flu. Do not treat yourself. This drug decreases your body's ability to fight infections. Try to avoid being around people who are sick. This medicine may increase your risk to bruise or bleed. Call your  doctor or health care professional if you notice any unusual bleeding. Talk to your doctor about your risk of cancer. You may be more at risk for certain types of cancers if you take this medicine. Do not become pregnant while taking this medicine or for at least 6 months after stopping it. Women should inform their doctor if they wish to become pregnant or think they might be pregnant. Women of child-bearing potential will need to have a negative pregnancy test before starting this medicine. There is a potential for serious side effects to an unborn child. Talk to your health care professional or pharmacist for more information. Do not breast-feed an infant while taking this medicine. Men must use a latex condom during sexual contact with a woman while taking this medicine and for at least 4 months after stopping it. A latex condom is needed even if you have had a vasectomy. Contact your doctor right away if your partner becomes pregnant. Do not donate sperm while taking this medicine and for at least 4 months after you stop taking this medicine. Men should inform their doctors if they wish to father a child. This medicine may lower sperm counts. What side effects may I notice from receiving this medication? Side effects that you should report to your doctor or health care professional  as soon as possible: allergic reactions like skin rash, itching or hives, swelling of the face, lips, or tongue low blood counts - this medicine may decrease the number of white blood cells, red blood cells, and platelets. You may be at increased risk for infections and bleeding nausea, vomiting redness, blistering, peeling or loosening of the skin, including inside the mouth signs and symptoms of infection like fever; chills; cough; sore throat; pain or trouble passing urine signs and symptoms of low red blood cells or anemia such as unusually weak or tired; feeling faint or lightheaded; falls; breathing problems unusual  bruising or bleeding Side effects that usually do not require medical attention (report to your doctor or health care professional if they continue or are bothersome): changes in taste diarrhea hair loss loss of appetite mouth sores This list may not describe all possible side effects. Call your doctor for medical advice about side effects. You may report side effects to FDA at 1-800-FDA-1088. Where should I keep my medication? This drug is given in a hospital or clinic and will not be stored at home. NOTE: This sheet is a summary. It may not cover all possible information. If you have questions about this medicine, talk to your doctor, pharmacist, or health care provider.  2022 Elsevier/Gold Standard (2018-05-23 16:57:15) Atezolizumab injection What is this medication? ATEZOLIZUMAB (a te zoe LIZ ue mab) is a monoclonal antibody. It is used to treat bladder cancer (urothelial cancer), liver cancer, lung cancer, and melanoma. This medicine may be used for other purposes; ask your health care provider or pharmacist if you have questions. COMMON BRAND NAME(S): Tecentriq What should I tell my care team before I take this medication? They need to know if you have any of these conditions: autoimmune diseases like Crohn's disease, ulcerative colitis, or lupus have had or planning to have an allogeneic stem cell transplant (uses someone else's stem cells) history of organ transplant history of radiation to the chest nervous system problems like myasthenia gravis or Guillain-Barre syndrome an unusual or allergic reaction to atezolizumab, other medicines, foods, dyes, or preservatives pregnant or trying to get pregnant breast-feeding How should I use this medication? This medicine is for infusion into a vein. It is given by a health care professional in a hospital or clinic setting. A special MedGuide will be given to you before each treatment. Be sure to read this information carefully each  time. Talk to your pediatrician regarding the use of this medicine in children. Special care may be needed. Overdosage: If you think you have taken too much of this medicine contact a poison control center or emergency room at once. NOTE: This medicine is only for you. Do not share this medicine with others. What if I miss a dose? It is important not to miss your dose. Call your doctor or health care professional if you are unable to keep an appointment. What may interact with this medication? Interactions have not been studied. This list may not describe all possible interactions. Give your health care provider a list of all the medicines, herbs, non-prescription drugs, or dietary supplements you use. Also tell them if you smoke, drink alcohol, or use illegal drugs. Some items may interact with your medicine. What should I watch for while using this medication? Your condition will be monitored carefully while you are receiving this medicine. You may need blood work done while you are taking this medicine. Do not become pregnant while taking this medicine or for at least 5  months after stopping it. Women should inform their doctor if they wish to become pregnant or think they might be pregnant. There is a potential for serious side effects to an unborn child. Talk to your health care professional or pharmacist for more information. Do not breast-feed an infant while taking this medicine or for at least 5 months after the last dose. What side effects may I notice from receiving this medication? Side effects that you should report to your doctor or health care professional as soon as possible: allergic reactions like skin rash, itching or hives, swelling of the face, lips, or tongue black, tarry stools bloody or watery diarrhea breathing problems changes in vision chest pain or chest tightness chills facial flushing fever headache signs and symptoms of high blood sugar such as dizziness; dry  mouth; dry skin; fruity breath; nausea; stomach pain; increased hunger or thirst; increased urination signs and symptoms of liver injury like dark yellow or brown urine; general ill feeling or flu-like symptoms; light-colored stools; loss of appetite; nausea; right upper belly pain; unusually weak or tired; yellowing of the eyes or skin stomach pain trouble passing urine or change in the amount of urine Side effects that usually do not require medical attention (report to your doctor or health care professional if they continue or are bothersome): bone pain cough diarrhea joint pain muscle pain muscle weakness swelling of arms or legs tiredness weight loss This list may not describe all possible side effects. Call your doctor for medical advice about side effects. You may report side effects to FDA at 1-800-FDA-1088. Where should I keep my medication? This drug is given in a hospital or clinic and will not be stored at home. NOTE: This sheet is a summary. It may not cover all possible information. If you have questions about this medicine, talk to your doctor, pharmacist, or health care provider.  2022 Elsevier/Gold Standard (2019-12-26 13:59:34)

## 2020-12-09 NOTE — Addendum Note (Signed)
Addended by: Earlie Server on: 12/09/2020 12:54 PM   Modules accepted: Orders

## 2020-12-10 ENCOUNTER — Other Ambulatory Visit: Payer: Self-pay

## 2020-12-10 ENCOUNTER — Inpatient Hospital Stay: Payer: Medicare Other | Attending: Oncology

## 2020-12-10 ENCOUNTER — Encounter: Payer: Self-pay | Admitting: Hematology and Oncology

## 2020-12-10 VITALS — BP 147/91 | HR 74 | Temp 96.8°F | Resp 18

## 2020-12-10 DIAGNOSIS — Z87891 Personal history of nicotine dependence: Secondary | ICD-10-CM | POA: Insufficient documentation

## 2020-12-10 DIAGNOSIS — Z5111 Encounter for antineoplastic chemotherapy: Secondary | ICD-10-CM | POA: Insufficient documentation

## 2020-12-10 DIAGNOSIS — Z5189 Encounter for other specified aftercare: Secondary | ICD-10-CM | POA: Insufficient documentation

## 2020-12-10 DIAGNOSIS — C3432 Malignant neoplasm of lower lobe, left bronchus or lung: Secondary | ICD-10-CM | POA: Insufficient documentation

## 2020-12-10 DIAGNOSIS — D701 Agranulocytosis secondary to cancer chemotherapy: Secondary | ICD-10-CM | POA: Diagnosis not present

## 2020-12-10 DIAGNOSIS — C3490 Malignant neoplasm of unspecified part of unspecified bronchus or lung: Secondary | ICD-10-CM

## 2020-12-10 MED ORDER — SODIUM CHLORIDE 0.9% FLUSH
10.0000 mL | INTRAVENOUS | Status: DC | PRN
  Administered 2020-12-10: 10 mL
  Filled 2020-12-10: qty 10

## 2020-12-10 MED ORDER — HEPARIN SOD (PORK) LOCK FLUSH 100 UNIT/ML IV SOLN
INTRAVENOUS | Status: AC
  Filled 2020-12-10: qty 5

## 2020-12-10 MED ORDER — SODIUM CHLORIDE 0.9 % IV SOLN
100.0000 mg/m2 | Freq: Once | INTRAVENOUS | Status: AC
  Administered 2020-12-10: 180 mg via INTRAVENOUS
  Filled 2020-12-10: qty 9

## 2020-12-10 MED ORDER — SODIUM CHLORIDE 0.9 % IV SOLN
Freq: Once | INTRAVENOUS | Status: AC
  Filled 2020-12-10: qty 250

## 2020-12-10 MED ORDER — SODIUM CHLORIDE 0.9 % IV SOLN
10.0000 mg | Freq: Once | INTRAVENOUS | Status: AC
  Administered 2020-12-10: 10 mg via INTRAVENOUS
  Filled 2020-12-10: qty 10

## 2020-12-10 MED ORDER — HEPARIN SOD (PORK) LOCK FLUSH 100 UNIT/ML IV SOLN
500.0000 [IU] | Freq: Once | INTRAVENOUS | Status: AC | PRN
  Administered 2020-12-10: 500 [IU]
  Filled 2020-12-10: qty 5

## 2020-12-10 NOTE — Patient Instructions (Signed)
[facility]COL[facility]  Discharge Instructions: Thank you for choosing [facility] to provide your oncology and hematology care.  If you have a lab appointment with the [facility], please go directly to the [facility] and check in at the registration area.  Wear comfortable clothing and clothing appropriate for easy access to any Portacath or PICC line.   We strive to give you quality time with your provider. You may need to reschedule your appointment if you arrive late (15 or more minutes).  Arriving late affects you and other patients whose appointments are after yours.  Also, if you miss three or more appointments without notifying the office, you may be dismissed from the clinic at the provider's discretion.      For prescription refill requests, have your pharmacy contact our office and allow 72 hours for refills to be completed.    Today you received the following chemotherapy and/or immunotherapy agents - etoposide      To help prevent nausea and vomiting after your treatment, we encourage you to take your nausea medication as directed.  BELOW ARE SYMPTOMS THAT SHOULD BE REPORTED IMMEDIATELY: *FEVER GREATER THAN 100.4 F (38 C) OR HIGHER *CHILLS OR SWEATING *NAUSEA AND VOMITING THAT IS NOT CONTROLLED WITH YOUR NAUSEA MEDICATION *UNUSUAL SHORTNESS OF BREATH *UNUSUAL BRUISING OR BLEEDING *URINARY PROBLEMS (pain or burning when urinating, or frequent urination) *BOWEL PROBLEMS (unusual diarrhea, constipation, pain near the anus) TENDERNESS IN MOUTH AND THROAT WITH OR WITHOUT PRESENCE OF ULCERS (sore throat, sores in mouth, or a toothache) UNUSUAL RASH, SWELLING OR PAIN  UNUSUAL VAGINAL DISCHARGE OR ITCHING   Items with * indicate a potential emergency and should be followed up as soon as possible or go to the Emergency Department if any problems should occur.  Please show the CHEMOTHERAPY ALERT CARD or IMMUNOTHERAPY ALERT CARD at check-in  to the Emergency Department and triage nurse.  Should you have questions after your visit or need to cancel or reschedule your appointment, please contact [facility]  [phone] and follow the prompts.  Office hours are 8:00 a.m. to 4:30 p.m. Monday - Friday. Please note that voicemails left after 4:00 p.m. may not be returned until the following business day.  We are closed weekends and major holidays. You have access to a nurse at all times for urgent questions. Please call the main number to the clinic [phone] and follow the prompts.  For any non-urgent questions, you may also contact your provider using MyChart. We now offer e-Visits for anyone 26 and older to request care online for non-urgent symptoms. For details visit [email or web address].   Also download the MyChart app! Go to the app store, search "MyChart", open the app, select [facility], and log in with your MyChart username and password.  Due to Covid, a mask is required upon entering the hospital/clinic. If you do not have a mask, one will be given to you upon arrival. For doctor visits, patients may have 1 support person aged 4 or older with them. For treatment visits, patients cannot have anyone with them due to current Covid guidelines and our immunocompromised population.   Etoposide, VP-16 injection What is this medication? ETOPOSIDE, VP-16 (e toe POE side) is a chemotherapy drug. It is used to treat testicular cancer, lung cancer, and other cancers. This medicine may be used for other purposes; ask your health care provider or pharmacist if you have questions. COMMON BRAND NAME(S): Etopophos, Toposar, VePesid  What should I tell my care team before I take this medication? They need to know if you have any of these conditions: infection kidney disease liver disease low blood counts, like low white cell, platelet, or red cell counts an unusual or allergic reaction to etoposide,  other medicines, foods, dyes, or preservatives pregnant or trying to get pregnant breast-feeding How should I use this medication? This medicine is for infusion into a vein. It is administered in a hospital or clinic by a specially trained health care professional. Talk to your pediatrician regarding the use of this medicine in children. Special care may be needed. Overdosage: If you think you have taken too much of this medicine contact a poison control center or emergency room at once. NOTE: This medicine is only for you. Do not share this medicine with others. What if I miss a dose? It is important not to miss your dose. Call your doctor or health care professional if you are unable to keep an appointment. What may interact with this medication? This medicine may interact with the following medications: warfarin This list may not describe all possible interactions. Give your health care provider a list of all the medicines, herbs, non-prescription drugs, or dietary supplements you use. Also tell them if you smoke, drink alcohol, or use illegal drugs. Some items may interact with your medicine. What should I watch for while using this medication? Visit your doctor for checks on your progress. This drug may make you feel generally unwell. This is not uncommon, as chemotherapy can affect healthy cells as well as cancer cells. Report any side effects. Continue your course of treatment even though you feel ill unless your doctor tells you to stop. In some cases, you may be given additional medicines to help with side effects. Follow all directions for their use. Call your doctor or health care professional for advice if you get a fever, chills or sore throat, or other symptoms of a cold or flu. Do not treat yourself. This drug decreases your body's ability to fight infections. Try to avoid being around people who are sick. This medicine may increase your risk to bruise or bleed. Call your doctor or  health care professional if you notice any unusual bleeding. Talk to your doctor about your risk of cancer. You may be more at risk for certain types of cancers if you take this medicine. Do not become pregnant while taking this medicine or for at least 6 months after stopping it. Women should inform their doctor if they wish to become pregnant or think they might be pregnant. Women of child-bearing potential will need to have a negative pregnancy test before starting this medicine. There is a potential for serious side effects to an unborn child. Talk to your health care professional or pharmacist for more information. Do not breast-feed an infant while taking this medicine. Men must use a latex condom during sexual contact with a woman while taking this medicine and for at least 4 months after stopping it. A latex condom is needed even if you have had a vasectomy. Contact your doctor right away if your partner becomes pregnant. Do not donate sperm while taking this medicine and for at least 4 months after you stop taking this medicine. Men should inform their doctors if they wish to father a child. This medicine may lower sperm counts. What side effects may I notice from receiving this medication? Side effects that you should report to your doctor or health care  professional as soon as possible: allergic reactions like skin rash, itching or hives, swelling of the face, lips, or tongue low blood counts - this medicine may decrease the number of white blood cells, red blood cells, and platelets. You may be at increased risk for infections and bleeding nausea, vomiting redness, blistering, peeling or loosening of the skin, including inside the mouth signs and symptoms of infection like fever; chills; cough; sore throat; pain or trouble passing urine signs and symptoms of low red blood cells or anemia such as unusually weak or tired; feeling faint or lightheaded; falls; breathing problems unusual bruising  or bleeding Side effects that usually do not require medical attention (report to your doctor or health care professional if they continue or are bothersome): changes in taste diarrhea hair loss loss of appetite mouth sores This list may not describe all possible side effects. Call your doctor for medical advice about side effects. You may report side effects to FDA at 1-800-FDA-1088. Where should I keep my medication? This drug is given in a hospital or clinic and will not be stored at home. NOTE: This sheet is a summary. It may not cover all possible information. If you have questions about this medicine, talk to your doctor, pharmacist, or health care provider.  2022 Elsevier/Gold Standard (2018-05-23 16:57:15)

## 2020-12-11 ENCOUNTER — Inpatient Hospital Stay: Payer: Medicare Other

## 2020-12-11 DIAGNOSIS — C3490 Malignant neoplasm of unspecified part of unspecified bronchus or lung: Secondary | ICD-10-CM

## 2020-12-11 DIAGNOSIS — Z5111 Encounter for antineoplastic chemotherapy: Secondary | ICD-10-CM | POA: Diagnosis not present

## 2020-12-11 MED ORDER — PEGFILGRASTIM INJECTION 6 MG/0.6ML ~~LOC~~
6.0000 mg | PREFILLED_SYRINGE | Freq: Once | SUBCUTANEOUS | Status: AC
  Administered 2020-12-11: 6 mg via SUBCUTANEOUS
  Filled 2020-12-11: qty 0.6

## 2020-12-17 ENCOUNTER — Ambulatory Visit: Payer: Medicare Other | Admitting: Oncology

## 2020-12-17 ENCOUNTER — Telehealth: Payer: Self-pay | Admitting: *Deleted

## 2020-12-17 ENCOUNTER — Emergency Department: Payer: Medicare Other

## 2020-12-17 ENCOUNTER — Inpatient Hospital Stay: Payer: Medicare Other

## 2020-12-17 ENCOUNTER — Inpatient Hospital Stay
Admission: EM | Admit: 2020-12-17 | Discharge: 2021-01-04 | DRG: 871 | Disposition: A | Discharge status: Skilled Nursing Facility | Payer: Medicare Other | Attending: Internal Medicine | Admitting: Internal Medicine

## 2020-12-17 ENCOUNTER — Ambulatory Visit: Payer: Medicare Other

## 2020-12-17 ENCOUNTER — Other Ambulatory Visit: Payer: Self-pay

## 2020-12-17 ENCOUNTER — Other Ambulatory Visit: Payer: Medicare Other

## 2020-12-17 DIAGNOSIS — I484 Atypical atrial flutter: Secondary | ICD-10-CM | POA: Diagnosis present

## 2020-12-17 DIAGNOSIS — K8000 Calculus of gallbladder with acute cholecystitis without obstruction: Secondary | ICD-10-CM | POA: Diagnosis not present

## 2020-12-17 DIAGNOSIS — I6340 Cerebral infarction due to embolism of unspecified cerebral artery: Secondary | ICD-10-CM | POA: Diagnosis not present

## 2020-12-17 DIAGNOSIS — D696 Thrombocytopenia, unspecified: Secondary | ICD-10-CM | POA: Diagnosis not present

## 2020-12-17 DIAGNOSIS — Z681 Body mass index (BMI) 19 or less, adult: Secondary | ICD-10-CM

## 2020-12-17 DIAGNOSIS — A414 Sepsis due to anaerobes: Secondary | ICD-10-CM

## 2020-12-17 DIAGNOSIS — F1010 Alcohol abuse, uncomplicated: Secondary | ICD-10-CM | POA: Diagnosis present

## 2020-12-17 DIAGNOSIS — Z7901 Long term (current) use of anticoagulants: Secondary | ICD-10-CM

## 2020-12-17 DIAGNOSIS — A4159 Other Gram-negative sepsis: Secondary | ICD-10-CM | POA: Diagnosis present

## 2020-12-17 DIAGNOSIS — G893 Neoplasm related pain (acute) (chronic): Secondary | ICD-10-CM | POA: Diagnosis present

## 2020-12-17 DIAGNOSIS — I129 Hypertensive chronic kidney disease with stage 1 through stage 4 chronic kidney disease, or unspecified chronic kidney disease: Secondary | ICD-10-CM | POA: Diagnosis present

## 2020-12-17 DIAGNOSIS — J910 Malignant pleural effusion: Secondary | ICD-10-CM | POA: Diagnosis present

## 2020-12-17 DIAGNOSIS — D709 Neutropenia, unspecified: Secondary | ICD-10-CM

## 2020-12-17 DIAGNOSIS — B182 Chronic viral hepatitis C: Secondary | ICD-10-CM | POA: Diagnosis present

## 2020-12-17 DIAGNOSIS — K819 Cholecystitis, unspecified: Secondary | ICD-10-CM

## 2020-12-17 DIAGNOSIS — E43 Unspecified severe protein-calorie malnutrition: Secondary | ICD-10-CM | POA: Diagnosis present

## 2020-12-17 DIAGNOSIS — I10 Essential (primary) hypertension: Secondary | ICD-10-CM | POA: Diagnosis not present

## 2020-12-17 DIAGNOSIS — N189 Chronic kidney disease, unspecified: Secondary | ICD-10-CM | POA: Diagnosis present

## 2020-12-17 DIAGNOSIS — A419 Sepsis, unspecified organism: Secondary | ICD-10-CM | POA: Diagnosis not present

## 2020-12-17 DIAGNOSIS — F3289 Other specified depressive episodes: Secondary | ICD-10-CM

## 2020-12-17 DIAGNOSIS — Z20822 Contact with and (suspected) exposure to covid-19: Secondary | ICD-10-CM | POA: Diagnosis present

## 2020-12-17 DIAGNOSIS — B961 Klebsiella pneumoniae [K. pneumoniae] as the cause of diseases classified elsewhere: Secondary | ICD-10-CM | POA: Diagnosis not present

## 2020-12-17 DIAGNOSIS — J9811 Atelectasis: Secondary | ICD-10-CM

## 2020-12-17 DIAGNOSIS — R0602 Shortness of breath: Secondary | ICD-10-CM

## 2020-12-17 DIAGNOSIS — L89616 Pressure-induced deep tissue damage of right heel: Secondary | ICD-10-CM | POA: Diagnosis present

## 2020-12-17 DIAGNOSIS — K8012 Calculus of gallbladder with acute and chronic cholecystitis without obstruction: Secondary | ICD-10-CM | POA: Diagnosis present

## 2020-12-17 DIAGNOSIS — R339 Retention of urine, unspecified: Secondary | ICD-10-CM | POA: Diagnosis not present

## 2020-12-17 DIAGNOSIS — L89626 Pressure-induced deep tissue damage of left heel: Secondary | ICD-10-CM | POA: Diagnosis present

## 2020-12-17 DIAGNOSIS — I6310 Cerebral infarction due to embolism of unspecified precerebral artery: Secondary | ICD-10-CM | POA: Diagnosis not present

## 2020-12-17 DIAGNOSIS — T451X5A Adverse effect of antineoplastic and immunosuppressive drugs, initial encounter: Secondary | ICD-10-CM | POA: Diagnosis present

## 2020-12-17 DIAGNOSIS — F1911 Other psychoactive substance abuse, in remission: Secondary | ICD-10-CM | POA: Diagnosis present

## 2020-12-17 DIAGNOSIS — J9621 Acute and chronic respiratory failure with hypoxia: Secondary | ICD-10-CM | POA: Diagnosis present

## 2020-12-17 DIAGNOSIS — D61810 Antineoplastic chemotherapy induced pancytopenia: Secondary | ICD-10-CM | POA: Diagnosis present

## 2020-12-17 DIAGNOSIS — E538 Deficiency of other specified B group vitamins: Secondary | ICD-10-CM | POA: Diagnosis present

## 2020-12-17 DIAGNOSIS — Z515 Encounter for palliative care: Secondary | ICD-10-CM

## 2020-12-17 DIAGNOSIS — I480 Paroxysmal atrial fibrillation: Secondary | ICD-10-CM | POA: Diagnosis present

## 2020-12-17 DIAGNOSIS — R6520 Severe sepsis without septic shock: Secondary | ICD-10-CM

## 2020-12-17 DIAGNOSIS — F32A Depression, unspecified: Secondary | ICD-10-CM | POA: Diagnosis present

## 2020-12-17 DIAGNOSIS — R6521 Severe sepsis with septic shock: Secondary | ICD-10-CM | POA: Diagnosis present

## 2020-12-17 DIAGNOSIS — C3490 Malignant neoplasm of unspecified part of unspecified bronchus or lung: Secondary | ICD-10-CM | POA: Diagnosis present

## 2020-12-17 DIAGNOSIS — J90 Pleural effusion, not elsewhere classified: Secondary | ICD-10-CM

## 2020-12-17 DIAGNOSIS — Z9889 Other specified postprocedural states: Secondary | ICD-10-CM

## 2020-12-17 DIAGNOSIS — C7951 Secondary malignant neoplasm of bone: Secondary | ICD-10-CM | POA: Diagnosis present

## 2020-12-17 DIAGNOSIS — D61818 Other pancytopenia: Secondary | ICD-10-CM

## 2020-12-17 DIAGNOSIS — Z66 Do not resuscitate: Secondary | ICD-10-CM | POA: Diagnosis present

## 2020-12-17 DIAGNOSIS — D6481 Anemia due to antineoplastic chemotherapy: Secondary | ICD-10-CM | POA: Diagnosis not present

## 2020-12-17 DIAGNOSIS — Z79899 Other long term (current) drug therapy: Secondary | ICD-10-CM

## 2020-12-17 DIAGNOSIS — C3492 Malignant neoplasm of unspecified part of left bronchus or lung: Secondary | ICD-10-CM | POA: Diagnosis not present

## 2020-12-17 DIAGNOSIS — D701 Agranulocytosis secondary to cancer chemotherapy: Secondary | ICD-10-CM | POA: Diagnosis present

## 2020-12-17 DIAGNOSIS — I69351 Hemiplegia and hemiparesis following cerebral infarction affecting right dominant side: Secondary | ICD-10-CM | POA: Diagnosis not present

## 2020-12-17 DIAGNOSIS — D6959 Other secondary thrombocytopenia: Secondary | ICD-10-CM | POA: Diagnosis present

## 2020-12-17 DIAGNOSIS — N179 Acute kidney failure, unspecified: Secondary | ICD-10-CM | POA: Diagnosis present

## 2020-12-17 DIAGNOSIS — R7881 Bacteremia: Secondary | ICD-10-CM | POA: Diagnosis not present

## 2020-12-17 DIAGNOSIS — E871 Hypo-osmolality and hyponatremia: Secondary | ICD-10-CM | POA: Diagnosis present

## 2020-12-17 DIAGNOSIS — R29810 Facial weakness: Secondary | ICD-10-CM | POA: Diagnosis not present

## 2020-12-17 DIAGNOSIS — Z87891 Personal history of nicotine dependence: Secondary | ICD-10-CM

## 2020-12-17 DIAGNOSIS — E876 Hypokalemia: Secondary | ICD-10-CM | POA: Diagnosis present

## 2020-12-17 LAB — COMPREHENSIVE METABOLIC PANEL
ALT: 32 U/L (ref 0–44)
AST: 51 U/L — ABNORMAL HIGH (ref 15–41)
Albumin: 3.1 g/dL — ABNORMAL LOW (ref 3.5–5.0)
Alkaline Phosphatase: 50 U/L (ref 38–126)
Anion gap: 13 (ref 5–15)
BUN: 31 mg/dL — ABNORMAL HIGH (ref 8–23)
CO2: 22 mmol/L (ref 22–32)
Calcium: 8 mg/dL — ABNORMAL LOW (ref 8.9–10.3)
Chloride: 101 mmol/L (ref 98–111)
Creatinine, Ser: 1.45 mg/dL — ABNORMAL HIGH (ref 0.61–1.24)
GFR, Estimated: 52 mL/min — ABNORMAL LOW (ref >60)
Glucose, Bld: 209 mg/dL — ABNORMAL HIGH (ref 70–99)
Potassium: 3.3 mmol/L — ABNORMAL LOW (ref 3.5–5.1)
Sodium: 136 mmol/L (ref 135–145)
Total Bilirubin: 1.8 mg/dL — ABNORMAL HIGH (ref 0.3–1.2)
Total Protein: 6.3 g/dL — ABNORMAL LOW (ref 6.5–8.1)

## 2020-12-17 LAB — CBC WITH DIFFERENTIAL/PLATELET
Abs Immature Granulocytes: 0 10*3/uL (ref 0.00–0.07)
Basophils Absolute: 0 10*3/uL (ref 0.0–0.1)
Basophils Relative: 0 %
Eosinophils Absolute: 0 10*3/uL (ref 0.0–0.5)
Eosinophils Relative: 0 %
HCT: 27 % — ABNORMAL LOW (ref 39.0–52.0)
Hemoglobin: 10.1 g/dL — ABNORMAL LOW (ref 13.0–17.0)
Immature Granulocytes: 0 %
Lymphocytes Relative: 88 %
Lymphs Abs: 0.1 10*3/uL — ABNORMAL LOW (ref 0.7–4.0)
MCH: 35.7 pg — ABNORMAL HIGH (ref 26.0–34.0)
MCHC: 37.4 g/dL — ABNORMAL HIGH (ref 30.0–36.0)
MCV: 95.4 fL (ref 80.0–100.0)
Monocytes Absolute: 0 10*3/uL — ABNORMAL LOW (ref 0.1–1.0)
Monocytes Relative: 6 %
Neutro Abs: 0 10*3/uL — CL (ref 1.7–7.7)
Neutrophils Relative %: 6 %
Platelets: 7 10*3/uL — CL (ref 150–400)
RBC: 2.83 MIL/uL — ABNORMAL LOW (ref 4.22–5.81)
RDW: 12.3 % (ref 11.5–15.5)
Smear Review: NORMAL
WBC: 0.2 10*3/uL — CL (ref 4.0–10.5)
nRBC: 0 % (ref 0.0–0.2)

## 2020-12-17 LAB — URINALYSIS, COMPLETE (UACMP) WITH MICROSCOPIC
Glucose, UA: 100 mg/dL — AB
Leukocytes,Ua: NEGATIVE
Nitrite: POSITIVE — AB
Protein, ur: 100 mg/dL — AB
Specific Gravity, Urine: 1.03 (ref 1.005–1.030)
pH: 5 (ref 5.0–8.0)

## 2020-12-17 LAB — RESP PANEL BY RT-PCR (FLU A&B, COVID) ARPGX2
Influenza A by PCR: NEGATIVE
Influenza B by PCR: NEGATIVE
SARS Coronavirus 2 by RT PCR: NEGATIVE

## 2020-12-17 LAB — TECHNOLOGIST SMEAR REVIEW: Plt Morphology: DECREASED

## 2020-12-17 LAB — LACTIC ACID, PLASMA
Lactic Acid, Venous: 4 mmol/L (ref 0.5–1.9)
Lactic Acid, Venous: 5.4 mmol/L (ref 0.5–1.9)
Lactic Acid, Venous: 6.1 mmol/L (ref 0.5–1.9)

## 2020-12-17 LAB — PROTIME-INR
INR: 1.5 — ABNORMAL HIGH (ref 0.8–1.2)
Prothrombin Time: 17.9 seconds — ABNORMAL HIGH (ref 11.4–15.2)

## 2020-12-17 LAB — CBC
HCT: 26.1 % — ABNORMAL LOW (ref 39.0–52.0)
Hemoglobin: 9.8 g/dL — ABNORMAL LOW (ref 13.0–17.0)
MCH: 36.2 pg — ABNORMAL HIGH (ref 26.0–34.0)
MCHC: 37.5 g/dL — ABNORMAL HIGH (ref 30.0–36.0)
MCV: 96.3 fL (ref 80.0–100.0)
Platelets: 8 10*3/uL — CL (ref 150–400)
RBC: 2.71 MIL/uL — ABNORMAL LOW (ref 4.22–5.81)
RDW: 12.6 % (ref 11.5–15.5)
WBC: 0.4 10*3/uL — CL (ref 4.0–10.5)
nRBC: 0 % (ref 0.0–0.2)

## 2020-12-17 LAB — C DIFFICILE QUICK SCREEN W PCR REFLEX
C Diff antigen: NEGATIVE
C Diff interpretation: NOT DETECTED
C Diff toxin: NEGATIVE

## 2020-12-17 LAB — APTT: aPTT: 51 seconds — ABNORMAL HIGH (ref 24–36)

## 2020-12-17 LAB — FIBRINOGEN: Fibrinogen: 485 mg/dL — ABNORMAL HIGH (ref 210–475)

## 2020-12-17 MED ORDER — LACTATED RINGERS IV SOLN
INTRAVENOUS | Status: DC
  Administered 2020-12-18: 1000 mL via INTRAVENOUS

## 2020-12-17 MED ORDER — DILTIAZEM HCL 25 MG/5ML IV SOLN
10.0000 mg | INTRAVENOUS | Status: DC | PRN
  Filled 2020-12-17 (×3): qty 5

## 2020-12-17 MED ORDER — ONDANSETRON HCL 4 MG PO TABS: 4.0000 mg | ORAL_TABLET | Freq: Four times a day (QID) | ORAL | Status: DC | PRN

## 2020-12-17 MED ORDER — TBO-FILGRASTIM 300 MCG/0.5ML ~~LOC~~ SOSY
300.0000 ug | PREFILLED_SYRINGE | Freq: Once | SUBCUTANEOUS | Status: DC
  Filled 2020-12-17: qty 0.5

## 2020-12-17 MED ORDER — VANCOMYCIN HCL 1500 MG/300ML IV SOLN
1500.0000 mg | Freq: Once | INTRAVENOUS | Status: AC
  Administered 2020-12-17: 1500 mg via INTRAVENOUS
  Filled 2020-12-17 (×2): qty 300

## 2020-12-17 MED ORDER — SODIUM CHLORIDE 0.9 % IV SOLN
10.0000 mL/h | Freq: Once | INTRAVENOUS | Status: AC
  Administered 2020-12-17: 10 mL/h via INTRAVENOUS

## 2020-12-17 MED ORDER — METRONIDAZOLE 500 MG/100ML IV SOLN
500.0000 mg | Freq: Two times a day (BID) | INTRAVENOUS | Status: DC
  Administered 2020-12-17 – 2020-12-18 (×2): 500 mg via INTRAVENOUS
  Filled 2020-12-17 (×2): qty 100

## 2020-12-17 MED ORDER — ACETAMINOPHEN 325 MG PO TABS: 650.0000 mg | ORAL_TABLET | Freq: Four times a day (QID) | ORAL | Status: DC | PRN

## 2020-12-17 MED ORDER — SODIUM CHLORIDE 0.9% IV SOLUTION
Freq: Once | INTRAVENOUS | Status: AC
  Filled 2020-12-17: qty 250

## 2020-12-17 MED ORDER — SODIUM CHLORIDE 0.9 % IV SOLN: 2.0000 g | Freq: Two times a day (BID) | INTRAVENOUS | Status: DC

## 2020-12-17 MED ORDER — VANCOMYCIN HCL 750 MG/150ML IV SOLN: 750.0000 mg | INTRAVENOUS | Status: DC

## 2020-12-17 MED ORDER — SODIUM CHLORIDE 0.9% IV SOLUTION: Freq: Once | INTRAVENOUS | Status: DC

## 2020-12-17 MED ORDER — SODIUM CHLORIDE 0.9 % IV SOLN
2.0000 g | Freq: Two times a day (BID) | INTRAVENOUS | Status: AC
  Administered 2020-12-18 (×2): 2 g via INTRAVENOUS
  Filled 2020-12-17 (×2): qty 2

## 2020-12-17 MED ORDER — SODIUM CHLORIDE 0.9 % IV SOLN
2.0000 g | INTRAVENOUS | Status: DC
Start: 2020-12-17 — End: 2020-12-17
  Administered 2020-12-17: 2 g via INTRAVENOUS
  Filled 2020-12-17: qty 20

## 2020-12-17 MED ORDER — ACETAMINOPHEN 650 MG RE SUPP: 650.0000 mg | Freq: Four times a day (QID) | RECTAL | Status: DC | PRN

## 2020-12-17 MED ORDER — ONDANSETRON HCL 4 MG/2ML IJ SOLN
4.0000 mg | Freq: Four times a day (QID) | INTRAMUSCULAR | Status: DC | PRN
  Administered 2021-01-03: 4 mg via INTRAVENOUS
  Filled 2020-12-17: qty 2

## 2020-12-17 MED ORDER — SODIUM CHLORIDE 0.9 % IV BOLUS (SEPSIS)
1000.0000 mL | Freq: Once | INTRAVENOUS | Status: AC
  Administered 2020-12-17: 1000 mL via INTRAVENOUS

## 2020-12-17 MED ORDER — FENTANYL CITRATE PF 50 MCG/ML IJ SOSY
12.5000 ug | PREFILLED_SYRINGE | INTRAMUSCULAR | Status: AC | PRN
  Administered 2020-12-18 (×2): 12.5 ug via INTRAVENOUS
  Filled 2020-12-17 (×2): qty 1

## 2020-12-17 MED ORDER — VANCOMYCIN HCL 750 MG/150ML IV SOLN
750.0000 mg | INTRAVENOUS | Status: DC
  Filled 2020-12-17: qty 150

## 2020-12-17 NOTE — ED Notes (Signed)
Pt stated he needed to use the restroom to have a BM since pt is weak this RN offered a bed pan, pt had already had a BM in the bed, pt was cleaned up and started to stool again, stool is very strong in smell and watery in consistency pt and brother unsure of ABX use. Will ask provider if he wants stool sample sent.

## 2020-12-17 NOTE — Progress Notes (Signed)
[facility] Progress note-no charge  # Evaluated patient and he is awake alert and oriented to self, age, location, current calendar year He endorses generalized weakness that has been ongoing for several months with no changes - He denies chest pain shortness of breath - Negative for petechiae - Patient states he is hungry and I ask if he would like applesauce which he endorses yes  # Followed up on CBC ordered - Platelet was 8, goal platelets is greater than or equal to 10 - WBC is 0.4 - Additional unit of platelets was ordered - Message nursing staff via secure chat - CBC with differential was ordered  Dr. Tobie Poet

## 2020-12-17 NOTE — Sepsis Progress Note (Signed)
EL[facility] monitoring sepsis protocol 

## 2020-12-17 NOTE — Progress Notes (Signed)
CODE SEPSIS - PHARMACY COMMUNICATION  **Broad Spectrum Antibiotics should be administered within 1 hour of Sepsis diagnosis**  Time Code Sepsis Called/Page Received: 1219  Antibiotics Ordered: ceftriaxone 2 grams every 24 hours  Time of 1st antibiotic administration: 1220  Additional action taken by pharmacy: None  If necessary, Name of Provider/Nurse Contacted: [place], PharmD Pharmacy Resident  12/17/2020 12:19 PM

## 2020-12-17 NOTE — ED Notes (Signed)
Pt presents to ED with c/o of feeling weak and having N/V. Pt is a stage 4 lung cancer pt and was supposed to have chemo today but couldn't make it because he was too weak to make it to the car. Pt presents with a strong urine smell. Pt presents tachycardiac with a-fib with RVR. Pt states HX of a-fib. Pt denies chest pain but states some SOB. Pt seems to be confused but is oriented to person, place, and time but has trouble with following simple commands such as keeping his hands to the side while we place pt on cardiac monitor. BP stable.

## 2020-12-17 NOTE — ED Notes (Signed)
Pt's L chest port accessed, MD aware, pt refusing PIV at this time due to port being accessed.

## 2020-12-17 NOTE — Telephone Encounter (Signed)
Received call from patient niece reporting hat patient has been vomiting blood for several days until he quit eating or drinking anything then it stopped. Patient is so weak that he cannot sit by himself and refuses to go to ER. He did not come for his appointment today due to the weakness. Please advise, she is on the way to his house now to evaluate if they can get him into a car.

## 2020-12-17 NOTE — ED Notes (Signed)
Paper blood consent signed by pt and is filed with pt paperwork.

## 2020-12-17 NOTE — ED Notes (Signed)
Pt keeps taking cardiac monitor off and pulse ox, this RN has attempted multiple times to redirect pt but it only lasts 2 seconds.

## 2020-12-17 NOTE — ED Notes (Signed)
Pt at CT

## 2020-12-17 NOTE — Telephone Encounter (Signed)
Called and spoke to niece. Advised her that it would be best if EMS were called to take pt the the ED. She stated that she would be calling EMS.

## 2020-12-17 NOTE — Consult Note (Signed)
Pharmacy Antibiotic Note  Scott Jordan is a 70 y.o. male admitted on 12/17/2020 with sepsis.  Pharmacy has been consulted for cefepime and vancomycin dosing.  Plan: -Initially received 2gms ceftriaxone per ED provider  -Per Inpt orders: Cefepime 2gms q12hr w/ first dose to be given at 0600 09/09 (18hrs post ceftriaxone dose) -vancomycin LD of 1500mg  (24mg s/kg). Continued vanco regimen of 750mg  q24hrs for AUC=430, Cmax=27.7, Cmin=11.2  Height: 6\' 2"  (188 cm) Weight: 61.7 kg (136 lb) IBW/kg (Calculated) : 82.2  Temp (24hrs), Avg:98.3 F (36.8 C), Min:98.3 F (36.8 C), Max:98.3 F (36.8 C)  Recent Labs  Lab 12/17/20 1205  WBC 0.2*  CREATININE 1.45*  LATICACIDVEN 4.0*    Estimated Creatinine Clearance: 42 mL/min (A) (by C-G formula based on SCr of 1.45 mg/dL (H)).    No Known Allergies  Antimicrobials this admission: 0908 ceftriaxone 2gm x1 dose 0908 cefepime 2gms q12hrs >>  0908 vancomycin 1500mg  LD, 750mg  q24hrs>>  Microbiology results: 0908 BCx: in process 0908 UCx: to be collected  0908 Covid: negative  Thank you for allowing pharmacy to be a part of this patient's care.  Berta Minor 12/17/2020 3:31 PM

## 2020-12-17 NOTE — ED Notes (Signed)
Pt refusing to keep cardiac monitor on at this time.

## 2020-12-17 NOTE — ED Provider Notes (Signed)
[facility] Emergency Department Provider Note  ____________________________________________  Time seen: Approximately 1:29 PM  I have reviewed the triage vital signs and the nursing notes.   HISTORY  Chief Complaint Emesis  Level 5 Caveat: Portions of the History and Physical including HPI and review of systems are unable to be completely obtained due to patient being a poor historian    HPI Scott Jordan is a 70 y.o. male with a history of atrial fibrillation on Eliquis, hypertension, lung cancer undergoing chemotherapy who comes the ED complaining of nausea and vomiting today.  Also has urinary frequency.  Denies any focal pain complaints or trauma.    Past Medical History:  Diagnosis Date  . A-fib ([place]) 01/10/2019   pt st this was dx by Dr. Ubaldo Glassing  . Cancer ([place]) 2021   LUNG  . Complication of anesthesia    DIFFICULTY WAKING UP AFTER SURGERY- 20 YRS AGO  . Hypertension   . Lung cancer ([place])   . Substance abuse [facility])      Patient Active Problem List   Diagnosis Date Noted  . Head trauma 06/24/2020  . Ulnar neuropathy of left upper extremity 05/13/2020  . Cervical radiculopathy 05/13/2020  . B12 deficiency 04/06/2020  . Weight loss 03/23/2020  . Neuropathy of left upper extremity 03/22/2020  . Hepatitis B infection without delta agent without hepatic coma 12/30/2019  . Chronic hepatitis C without hepatic coma ([place]) 12/30/2019  . Chronic anticoagulation 12/23/2019  . Elevated LFTs 12/23/2019  . Hypokalemia 12/08/2019  . Acute respiratory failure with hypoxia ([place]) 11/23/2019  . Heroin overdose, accidental or unintentional, initial encounter ([place]) 11/23/2019  . Aspiration pneumonia ([place]) 11/23/2019  . Pancytopenia ([place]) 11/23/2019  . Atypical atrial flutter ([place]) 11/23/2019  . Hypomagnesemia 10/21/2019  . Antineoplastic chemotherapy induced anemia 10/13/2019  . Dehydration 10/13/2019  . Thrombocytopenia ([place]) 10/13/2019  . Chemotherapy  induced neutropenia ([place]) 10/10/2019  . Anemia 09/11/2019  . Cancer-related pain 09/05/2019  . Encounter for antineoplastic chemotherapy 09/02/2019  . Encounter for antineoplastic immunotherapy 09/02/2019  . Bone metastasis ([place]) 09/02/2019  . Goals of care, counseling/discussion 08/26/2019  . Small cell lung cancer ([place]) 08/23/2019  . Protein-calorie malnutrition, severe 08/19/2019  . Hyponatremia   . Dyspnea   . Pleural effusion   . Recurrent pleural effusion on left   . Malignant pleural effusion 08/17/2019  . Alcohol abuse 07/21/2019  . History of substance abuse ([place]) 06/18/2019  . Hypertension, essential 06/18/2019  . Malnutrition of moderate degree 01/15/2019  . Elevated troponin 01/14/2019     Past Surgical History:  Procedure Laterality Date  . ARM DEBRIDEMENT Left    INCISION AND DEBRIDEMENT LOWER ARM -20 YRS AGO  . BACK SURGERY    . PORTACATH PLACEMENT Left 08/29/2019   Procedure: INSERTION PORT-A-CATH;  Surgeon: Nestor Lewandowsky, MD;  Location: [facility] ORS;  Service: General;  Laterality: Left;     Prior to Admission medications   Medication Sig Start Date End Date Taking? Authorizing Provider  diltiazem (TIAZAC) 240 MG 24 hr capsule Take 1 capsule by mouth daily. 07/24/19   [provider]  ELIQUIS 5 MG TABS tablet Take 5 mg by mouth 2 (two) times daily. 08/30/19   [provider]  feeding supplement, ENSURE ENLIVE, (ENSURE ENLIVE) LIQD Take 237 mLs by mouth 3 (three) times daily between meals. 08/23/19   Enzo Bi, MD  lidocaine-prilocaine (EMLA) cream Apply to affected area once Patient not taking: Reported on 12/08/2020 08/26/19   Lequita Asal,  MD  magnesium oxide (MAG-OX) 400 MG tablet Take by mouth.    [provider]  MAVYRET 100-40 MG TABS Take 3 tablets by mouth daily. Patient not taking: No sig reported 08/18/20   [provider]  mirtazapine (REMERON) 15 MG tablet Take by mouth. Patient not taking: No sig reported 06/26/20  06/26/21  [provider]  naloxone [facility] nasal spray 4 mg/0.1 mL once as needed Patient not taking: No sig reported 01/13/19   [provider]  ondansetron (ZOFRAN) 8 MG tablet Take 1 tablet (8 mg total) by mouth every 8 (eight) hours as needed for refractory nausea / vomiting. Start on day 3 after carboplatin chemo. Patient not taking: No sig reported 08/26/19   Lequita Asal, MD  potassium chloride SA (KLOR-CON) 20 MEQ tablet TAKE 1 TABLET BY MOUTH DAILY FOR 3 DAYS 12/03/19   Lequita Asal, MD  prochlorperazine (COMPAZINE) 10 MG tablet Take 1 tablet (10 mg total) by mouth every 6 (six) hours as needed for nausea or vomiting. 12/08/20   Earlie Server, MD     Allergies Patient has no known allergies.   History reviewed. No pertinent family history.  Social History Social History   Tobacco Use  . Smoking status: Former    Packs/day: 0.50    Types: Cigarettes  . Smokeless tobacco: Never  . Tobacco comments:    not smoked since 6 weeks ago  Vaping Use  . Vaping Use: Never used  Substance Use Topics  . Alcohol use: Yes    Comment: 84 OZ IN WEEK  . Drug use: Not Currently    Types: Heroin    Comment: heroin WITHIN PAST YEAR    Review of Systems  Constitutional:   No fever or chills.  ENT:   No sore throat. No rhinorrhea. Cardiovascular:   No chest pain or syncope. Respiratory:   No dyspnea positive cough. Gastrointestinal:   Negative for abdominal pain, positive vomiting and diarrhea.  Musculoskeletal:   Negative for focal pain or swelling All other systems reviewed and are negative except as documented above in ROS and HPI.  ____________________________________________   PHYSICAL EXAM:  VITAL SIGNS: ED Triage Vitals  Enc Vitals Group     BP 12/17/20 1153 (!) 131/58     Pulse Rate 12/17/20 1153 (!) 118     Resp 12/17/20 1200 (!) 43     Temp 12/17/20 1153 98.3 F (36.8 C)     Temp Source 12/17/20 1153 Oral     SpO2 12/17/20 1153 94 %      Weight 12/17/20 1203 136 lb (61.7 kg)     Height 12/17/20 1203 6\' 2"  (1.88 m)     Head Circumference --      Peak Flow --      Pain Score 12/17/20 1202 0     Pain Loc --      Pain Edu? --      Excl. in [place]? --     Vital signs reviewed, nursing assessments reviewed.   Constitutional:   Alert and oriented to person and place.  Ill-appearing. Eyes:   Conjunctivae are normal. EOMI. PERRL. ENT      Head:   Normocephalic and atraumatic.      Nose:   Normal      Mouth/Throat:   Dry mucous membranes.      Neck:   No meningismus. Full ROM. Hematological/Lymphatic/Immunilogical:   No cervical lymphadenopathy. Cardiovascular:   Tachycardia heart rate 130. Symmetric bilateral radial  and DP pulses.  No murmurs. Cap refill less than 2 seconds. Respiratory:   Normal respiratory effort without tachypnea/retractions. Breath sounds are clear and equal bilaterally. No wheezes/rales/rhonchi. Gastrointestinal:   Soft with suprapubic tenderness. Non distended. There is no CVA tenderness.  No rebound, rigidity, or guarding. Genitourinary:   deferred Musculoskeletal:   Normal range of motion in all extremities. No joint effusions.  No lower extremity tenderness.  No edema. Neurologic:   Normal speech and language.  Motor grossly intact. No acute focal neurologic deficits are appreciated.  Skin:    Skin is warm, dry and intact. No rash noted.  No petechiae, purpura, or bullae.  ____________________________________________    LABS (pertinent positives/negatives) (all labs ordered are listed, but only abnormal results are displayed) Labs Reviewed  LACTIC ACID, PLASMA - Abnormal; Notable for the following components:      Result Value   Lactic Acid, Venous 4.0 (*)    All other components within normal limits  COMPREHENSIVE METABOLIC PANEL - Abnormal; Notable for the following components:   Potassium 3.3 (*)    Glucose, Bld 209 (*)    BUN 31 (*)    Creatinine, Ser 1.45 (*)    Calcium 8.0 (*)     Total Protein 6.3 (*)    Albumin 3.1 (*)    AST 51 (*)    Total Bilirubin 1.8 (*)    GFR, Estimated 52 (*)    All other components within normal limits  PROTIME-INR - Abnormal; Notable for the following components:   Prothrombin Time 17.9 (*)    INR 1.5 (*)    All other components within normal limits  APTT - Abnormal; Notable for the following components:   aPTT 51 (*)    All other components within normal limits  RESP PANEL BY RT-PCR (FLU A&B, COVID) ARPGX2  CULTURE, BLOOD (ROUTINE X 2)  CULTURE, BLOOD (ROUTINE X 2)  URINE CULTURE  LACTIC ACID, PLASMA  CBC WITH DIFFERENTIAL/PLATELET  URINALYSIS, COMPLETE (UACMP) WITH MICROSCOPIC   ____________________________________________   EKG    ____________________________________________    RADIOLOGY  DG Chest Port 1 View  Result Date: 12/17/2020 CLINICAL DATA:  Questionable sepsis.  Weakness EXAM: PORTABLE CHEST 1 VIEW COMPARISON:  03/09/2020 FINDINGS: Left subclavian approached chest port remains in place. Stable cardiomegaly. Chronic left basilar scarring or atelectasis. Peripheral left upper lobe nodule better seen on prior CT. No new airspace consolidation. Pleural effusion or pneumothorax. IMPRESSION: Chronic left basilar scarring or atelectasis. No new or acute findings. Electronically Signed   By: Davina Poke D.O.   On: 12/17/2020 13:20    ____________________________________________   PROCEDURES .Critical Care Performed by: Carrie Mew, MD Authorized by: Carrie Mew, MD   Critical care provider statement:    Critical care time (minutes):  35   Critical care time was exclusive of:  Separately billable procedures and treating other patients   Critical care was necessary to treat or prevent imminent or life-threatening deterioration of the following conditions:  Sepsis, shock and metabolic crisis   Critical care was time spent personally by me on the following activities:  Development of treatment plan  with patient or surrogate, discussions with consultants, evaluation of patient's response to treatment, examination of patient, obtaining history from patient or surrogate, ordering and performing treatments and interventions, ordering and review of laboratory studies, ordering and review of radiographic studies, pulse oximetry, re-evaluation of patient's condition and review of old charts  ____________________________________________  DIFFERENTIAL DIAGNOSIS   Pneumonia, UTI, appendicitis, bowel obstruction,  gastroenteritis, viral illness, neutropenia, anemia, electrolyte abnormality  CLINICAL IMPRESSION / ASSESSMENT AND PLAN / ED COURSE  Medications ordered in the ED: Medications  cefTRIAXone (ROCEPHIN) 2 g in sodium chloride 0.9 % 100 mL IVPB (2 g Intravenous New Bag/Given 12/17/20 1220)  sodium chloride 0.9 % bolus 1,000 mL (1,000 mLs Intravenous Bolus 12/17/20 1221)    And  sodium chloride 0.9 % bolus 1,000 mL (1,000 mLs Intravenous New Bag/Given 12/17/20 1221)    Pertinent labs & imaging results that were available during my care of the patient were reviewed by me and considered in my medical decision making (see chart for details).  Scott Jordan was evaluated in Emergency Department on 12/17/2020 for the symptoms described in the history of present illness. He was evaluated in the context of the global COVID-19 pandemic, which necessitated consideration that the patient might be at risk for infection with the SARS-CoV-2 virus that causes COVID-19. Institutional protocols and algorithms that pertain to the evaluation of patients at risk for COVID-19 are in a state of rapid change based on information released by regulatory bodies including the CDC and federal and state organizations. These policies and algorithms were followed during the patient's care in the ED.   Patient presents with tachycardia, tachypnea, ill appearance with overall lack of focal symptoms or exam findings.  Concern for  sepsis.  Sepsis protocol activated after initial assessment.  2 L IV fluid bolus, IV ceftriaxone.  We will need to admit for ongoing management.  Clinical Course as of 12/17/20 1529  Thu Dec 17, 2020  1529 Platelet transfusion ordered for platelets under 10.  No signs of acute hemorrhage.  Due to confusion and thrombocytopenia, will obtain CT scan of the head as well as CT abdomen pelvis.  Case discussed with hospitalist for further management. [PS]    Clinical Course User Index [PS] Carrie Mew, MD     ____________________________________________   FINAL CLINICAL IMPRESSION(S) / ED DIAGNOSES    Final diagnoses:  Septic shock ([place])  Chemotherapy-induced neutropenia ([place])  Thrombocytopenia [facility])     ED Discharge Orders     None       Portions of this note were generated with dragon dictation software. Dictation errors may occur despite best attempts at proofreading.    Carrie Mew, MD 12/17/20 [phone]

## 2020-12-17 NOTE — H&P (Addendum)
History and Physical   CAMPBELL KRAY VVO:[id number] DOB: May 31, 1950 DOA: 12/17/2020  PCP: Earlie Server, MD  Outpatient Specialists: Dr. Tasia Catchings, medical oncology Patient coming from: Home via EMS  I have personally briefly reviewed patient's old medical records in [place].  Chief Concern: Nausea and vomiting  HPI: Scott Jordan is a 70 y.o. 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  At bedside patient was awake and mildly confused.  He was redirectable.  He frequently attempts to take telemetry pads off his chest.  He was able to tell me his name.  He was able to tell me his age.  He was unable to tell me the current calendar year.  At bedside he denies any chest pain, abdominal pain, shortness of breath, fever, chills, headaches, dysuria, hematuria.  He denies nausea or vomiting.  He reports that he had bloody emesis last week.  He states that he has not had any vomiting in the last couple of days.  He states, "I feel just fine ".  Social history: He lives at home by himself and has a family friend that stays with him.  His healthcare power of attorney is his niece, [name].  He is a former tobacco user and EtOH user.  He denies recreational drug use.  Vaccination history: Unknown  ROS: Constitutional: no weight change, no fever ENT/Mouth: no sore throat, no rhinorrhea Eyes: no eye pain, no vision changes Cardiovascular: no chest pain, no dyspnea,  no edema, no palpitations Respiratory: no cough, no sputum, no wheezing Gastrointestinal: no nausea, no vomiting, no  diarrhea, no constipation Genitourinary: no urinary incontinence, no dysuria, no hematuria Musculoskeletal: no arthralgias, no myalgias Skin: no skin lesions, no pruritus, Neuro: + weakness, no loss of consciousness, no syncope Psych: no anxiety, no depression, + decrease appetite Heme/Lymph: no bruising, no bleeding  ED Course: Discussed with emergency medicine provider, patient requiring hospitalization for chief concerns of meeting sepsis criteria.  Vitals in the emergency department was remarkable for temperature 98.3, respiration rate is increased at 43 and down to 30, heart rate of 85, blood pressure 131/58, SPO2 of 95% on room air.  Labs in the emergency department was remarkable for WBC of 0.2, hemoglobin 10.1, platelets of 7, absolute neutrophil counts were 0.  ED provider ordered 1 units of platelets. Patient was given ceftriaxone 2 g IV, sodium chloride bolus 2 L total.  CT of the head without contrast was ordered.  C. difficile was ordered.  Assessment/Plan  Principal Problem:   Sepsis ([name]) Active Problems:   Protein-calorie malnutrition, severe   Small cell lung cancer (HCC)   Bone metastasis ([place])   Chemotherapy induced neutropenia (HCC)   Antineoplastic chemotherapy induced anemia   Thrombocytopenia (HCC)   Atypical atrial flutter (HCC)   Chronic anticoagulation   Alcohol abuse   History of substance abuse ([place])   Hypertension, essential   B12 deficiency   # Patient meet sepsis criteria with elevated respiration rate, increased heart rate, and neutropenia - Etiology/source work-up in progress at this time - Sepsis broad-spectrum antibiotic ordered - Status post sodium chloride 2 L bolus - Blood cultures x2 are in process - Urine culture in process -  Patient is maintaining appropriate MAP - No indications for further IVF at this time - C. difficile, GI panel ordered due to patient with severely foul-smelling diarrhea and immunosuppression - C. difficile was  negative - GI panel has been collected  # Neutropenia-neutropenic precaution, CBC in the a.m. patient - We will check fibrinogen - Filgrastim (Granix) injection 30 mcg subcutaneous was ordered however after discussion with hematologist/oncologist, Dr. Tasia Catchings suspects this is secondary to [facility] and she recommends to hold on Granix at this time - Follow-up with CBC scheduled for 2000  # Thrombocytopenia-severe - CT of the head: No definite CT evidence of acute intracranial abnormality.  Atrophy.  Extensive white matter disease, grossly stable as compared to recent CT of the head in March. - Status post 1 unit of platelets ordered-platelets confirmed with blood bank that it is irradiated - Negative for petechiae - Repeat CBC scheduled for 2000 - Dr. Tasia Catchings, medical oncology/hematology has been consulted and will see the patient tomorrow, 12/18/2020  # History of atrial fibrillation-Eliquis has not been resumed due to thrombocytopenia # Aspiration precautions  # Pharmacologic DVT prophylaxis has not been initiated by myself due to platelets of 7 - A.m. team to initiate pharmacologic DVT prophylaxis when appropriate  Chart reviewed.   DVT prophylaxis: TED hose Code Status: full code  Diet: N.p.o. Family Communication: Dated healthcare power of attorney at bedside, [place] Disposition Plan: Pending clinical course Consults called: Hematology/oncology Admission status: Inpatient, stepdown, telemetry  Past Medical History:  Diagnosis Date   A-fib ([place]) 01/10/2019   pt st this was dx by Dr. Ubaldo Glassing   Cancer [facility]) [id number]   LUNG   Complication of anesthesia    DIFFICULTY WAKING UP AFTER SURGERY- 27 YRS AGO   Hypertension    Lung cancer ([place])    Substance abuse ([place])    Past Surgical History:  Procedure Laterality Date   ARM DEBRIDEMENT Left    INCISION AND DEBRIDEMENT LOWER ARM -20 YRS AGO   BACK SURGERY     PORTACATH PLACEMENT Left 08/29/2019   Procedure: INSERTION PORT-A-CATH;  Surgeon:  Nestor Lewandowsky, MD;  Location: [facility] ORS;  Service: General;  Laterality: Left;   Social History:  reports that he has quit smoking. His smoking use included cigarettes. He smoked an average of .5 packs per day. He has never used smokeless tobacco. He reports current alcohol use. He reports that he does not currently use drugs after having used the following drugs: Heroin.  No Known Allergies History reviewed. No pertinent family history. Family history: Family history reviewed and not pertinent  Prior to Admission medications   Medication Sig Start Date End Date Taking? Authorizing Provider  diltiazem (TIAZAC) 240 MG 24 hr capsule Take 1 capsule by mouth daily. 07/24/19   [provider]  ELIQUIS 5 MG TABS tablet Take 5 mg by mouth 2 (two) times daily. 08/30/19   [provider]  feeding supplement, ENSURE ENLIVE, (ENSURE ENLIVE) LIQD Take 237 mLs by mouth 3 (three) times daily between meals. 08/23/19   Enzo Bi, MD  lidocaine-prilocaine (EMLA) cream Apply to affected area once Patient not taking: Reported on 12/08/2020 08/26/19   Lequita Asal, MD  magnesium oxide (MAG-OX) 400 MG tablet Take by mouth.    [provider]  MAVYRET 100-40 MG TABS Take 3 tablets by mouth daily. Patient not taking: No sig reported 08/18/20   [provider]  mirtazapine (REMERON) 15 MG tablet Take by mouth. Patient not taking: No sig reported 06/26/20  06/26/21  [provider]  naloxone [facility]) nasal spray 4 mg/0.1 mL once as needed Patient not taking: No sig reported 01/13/19   [provider]  ondansetron (ZOFRAN) 8 MG tablet Take 1 tablet (8 mg total) by mouth every 8 (eight) hours as needed for refractory nausea / vomiting. Start on day 3 after carboplatin chemo. Patient not taking: No sig reported 08/26/19   Lequita Asal, MD  potassium chloride SA (KLOR-CON) 20 MEQ tablet TAKE 1 TABLET BY MOUTH DAILY FOR 3 DAYS 12/03/19   Lequita Asal, MD   prochlorperazine (COMPAZINE) 10 MG tablet Take 1 tablet (10 mg total) by mouth every 6 (six) hours as needed for nausea or vomiting. 12/08/20   Earlie Server, MD   Physical Exam: Vitals:   12/17/20 1203 12/17/20 1230 12/17/20 1400 12/17/20 1500  BP:  (!) 142/95 (!) 105/91 (!) 185/173  Pulse:  (!) 150 88 84  Resp:  (!) 25 (!) 25 (!) 25  Temp:      TempSrc:      SpO2:  93% 96% 92%  Weight: 61.7 kg     Height: 6\' 2"  (1.88 m)      Constitutional: appears older than chronological age, frail, cachectic, NAD, calm, comfortable Eyes: PERRL, lids and conjunctivae normal ENMT: Mucous membranes are moist. Posterior pharynx clear of any exudate or lesions. Age-appropriate dentition. Hearing appropriate Neck: normal, supple, no masses, no thyromegaly Respiratory: clear to auscultation bilaterally, no wheezing, no crackles. Normal respiratory effort. No accessory muscle use.  Cardiovascular: Regular rate and rhythm, no murmurs / rubs / gallops. No extremity edema. 2+ pedal pulses. No carotid bruits.  Abdomen: Scaphoid abdomen, no tenderness, no hepatosplenomegaly. Bowel sounds positive.  Musculoskeletal: no clubbing / cyanosis. No joint deformity upper and lower extremities. Good ROM, no contractures, no atrophy. Normal muscle tone.  Skin: no rashes, lesions, ulcers. No induration Neurologic: Sensation intact. Strength 5/5 in all 4.  Psychiatric: Normal judgment and insight. Alert and oriented x 3.  Confused mood.   EKG: Ordered and discussed with nursing.  Patient persistently been moves EKG leads and will not allow nursing to place leads to assess EKG.  Chest x-ray on Admission: I personally reviewed and I agree with radiologist reading as below.  DG Chest Port 1 View  Result Date: 12/17/2020 CLINICAL DATA:  Questionable sepsis.  Weakness EXAM: PORTABLE CHEST 1 VIEW COMPARISON:  03/09/2020 FINDINGS: Left subclavian approached chest port remains in place. Stable cardiomegaly. Chronic left basilar  scarring or atelectasis. Peripheral left upper lobe nodule better seen on prior CT. No new airspace consolidation. Pleural effusion or pneumothorax. IMPRESSION: Chronic left basilar scarring or atelectasis. No new or acute findings. Electronically Signed   By: Davina Poke D.O.   On: 12/17/2020 13:20    Labs on Admission: I have personally reviewed following labs  CBC: Recent Labs  Lab 12/17/20 1205  WBC 0.2*  NEUTROABS 0.0*  HGB 10.1*  HCT 27.0*  MCV 95.4  PLT 7*   Basic Metabolic Panel: Recent Labs  Lab 12/17/20 1205  NA 136  K 3.3*  CL 101  CO2 22  GLUCOSE 209*  BUN 31*  CREATININE 1.45*  CALCIUM 8.0*   GFR: Estimated Creatinine Clearance: 42 mL/min (A) (by C-G formula based on SCr of 1.45 mg/dL (H)).  Liver Function Tests: Recent Labs  Lab 12/17/20 1205  AST 51*  ALT 32  ALKPHOS 50  BILITOT 1.8*  PROT 6.3*  ALBUMIN 3.1*   Coagulation Profile: Recent Labs  Lab 12/17/20 1205  INR 1.5*   Dr. Tobie Poet [facility]  If 7PM-7AM, please contact overnight-coverage provider If 7AM-7PM, please contact day coverage provider [email or web address]  12/17/2020, 3:28 PM

## 2020-12-18 ENCOUNTER — Other Ambulatory Visit: Payer: Self-pay

## 2020-12-18 ENCOUNTER — Inpatient Hospital Stay: Payer: Medicare Other

## 2020-12-18 DIAGNOSIS — D709 Neutropenia, unspecified: Secondary | ICD-10-CM | POA: Diagnosis not present

## 2020-12-18 DIAGNOSIS — A414 Sepsis due to anaerobes: Secondary | ICD-10-CM | POA: Diagnosis not present

## 2020-12-18 DIAGNOSIS — C3490 Malignant neoplasm of unspecified part of unspecified bronchus or lung: Secondary | ICD-10-CM | POA: Diagnosis not present

## 2020-12-18 DIAGNOSIS — A419 Sepsis, unspecified organism: Secondary | ICD-10-CM | POA: Diagnosis not present

## 2020-12-18 LAB — BLOOD CULTURE ID PANEL (REFLEXED) - BCID2

## 2020-12-18 LAB — CBC WITH DIFFERENTIAL/PLATELET
Abs Immature Granulocytes: 0 10*3/uL (ref 0.00–0.07)
Basophils Absolute: 0 10*3/uL (ref 0.0–0.1)
Basophils Relative: 0 %
Eosinophils Absolute: 0 10*3/uL (ref 0.0–0.5)
Eosinophils Relative: 0 %
HCT: 25.6 % — ABNORMAL LOW (ref 39.0–52.0)
Hemoglobin: 9.2 g/dL — ABNORMAL LOW (ref 13.0–17.0)
Immature Granulocytes: 0 %
Lymphocytes Relative: 75 %
Lymphs Abs: 0.3 10*3/uL — ABNORMAL LOW (ref 0.7–4.0)
MCH: 33.8 pg (ref 26.0–34.0)
MCHC: 35.9 g/dL (ref 30.0–36.0)
MCV: 94.1 fL (ref 80.0–100.0)
Monocytes Absolute: 0 10*3/uL — ABNORMAL LOW (ref 0.1–1.0)
Monocytes Relative: 5 %
Neutro Abs: 0.1 10*3/uL — CL (ref 1.7–7.7)
Neutrophils Relative %: 20 %
Platelets: 16 10*3/uL — CL (ref 150–400)
RBC: 2.72 MIL/uL — ABNORMAL LOW (ref 4.22–5.81)
RDW: 12.5 % (ref 11.5–15.5)
Smear Review: NORMAL
WBC: 0.4 10*3/uL — CL (ref 4.0–10.5)
nRBC: 0 % (ref 0.0–0.2)

## 2020-12-18 LAB — CORTISOL-AM, BLOOD: Cortisol - AM: 42.8 ug/dL — ABNORMAL HIGH (ref 6.7–22.6)

## 2020-12-18 LAB — BASIC METABOLIC PANEL
Anion gap: 13 (ref 5–15)
BUN: 48 mg/dL — ABNORMAL HIGH (ref 8–23)
CO2: 21 mmol/L — ABNORMAL LOW (ref 22–32)
Calcium: 7.4 mg/dL — ABNORMAL LOW (ref 8.9–10.3)
Chloride: 101 mmol/L (ref 98–111)
Creatinine, Ser: 1.46 mg/dL — ABNORMAL HIGH (ref 0.61–1.24)
GFR, Estimated: 52 mL/min — ABNORMAL LOW (ref >60)
Glucose, Bld: 166 mg/dL — ABNORMAL HIGH (ref 70–99)
Potassium: 3 mmol/L — ABNORMAL LOW (ref 3.5–5.1)
Sodium: 135 mmol/L (ref 135–145)

## 2020-12-18 LAB — PREPARE PLATELET PHERESIS: Unit division: 0

## 2020-12-18 LAB — BPAM PLATELET PHERESIS
Blood Product Expiration Date: 202209082359
ISSUE DATE / TIME: 202209081614
Unit Type and Rh: 5100

## 2020-12-18 LAB — LACTATE DEHYDROGENASE: LDH: 610 U/L — ABNORMAL HIGH (ref 98–192)

## 2020-12-18 LAB — PATHOLOGIST SMEAR REVIEW

## 2020-12-18 LAB — PROTIME-INR
INR: 1.6 — ABNORMAL HIGH (ref 0.8–1.2)
Prothrombin Time: 18.9 seconds — ABNORMAL HIGH (ref 11.4–15.2)

## 2020-12-18 LAB — PROCALCITONIN: Procalcitonin: 24.39 ng/mL

## 2020-12-18 LAB — PLATELET COUNT: Platelets: 28 10*3/uL — CL (ref 150–400)

## 2020-12-18 LAB — MRSA NEXT GEN BY PCR, NASAL: MRSA by PCR Next Gen: DETECTED — AB

## 2020-12-18 MED ORDER — ENSURE ENLIVE PO LIQD
237.0000 mL | Freq: Three times a day (TID) | ORAL | Status: DC
  Administered 2020-12-19 – 2020-12-23 (×6): 237 mL via ORAL

## 2020-12-18 MED ORDER — CHLORHEXIDINE GLUCONATE CLOTH 2 % EX PADS
6.0000 | MEDICATED_PAD | Freq: Every day | CUTANEOUS | Status: DC
  Administered 2020-12-20 – 2021-01-04 (×14): 6 via TOPICAL

## 2020-12-18 MED ORDER — POTASSIUM CHLORIDE 20 MEQ PO PACK
40.0000 meq | PACK | Freq: Two times a day (BID) | ORAL | Status: AC
  Administered 2020-12-18 (×2): 40 meq via ORAL
  Filled 2020-12-18 (×2): qty 2

## 2020-12-18 MED ORDER — CHLORHEXIDINE GLUCONATE 0.12 % MT SOLN
10.0000 mL | Freq: Two times a day (BID) | OROMUCOSAL | Status: DC
  Administered 2020-12-18 – 2021-01-04 (×13): 10 mL via OROMUCOSAL
  Filled 2020-12-18 (×24): qty 15

## 2020-12-18 MED ORDER — FOLIC ACID 1 MG PO TABS
1.0000 mg | ORAL_TABLET | Freq: Every day | ORAL | Status: DC
  Administered 2020-12-18 – 2021-01-04 (×15): 1 mg via ORAL
  Filled 2020-12-18 (×18): qty 1

## 2020-12-18 MED ORDER — DILTIAZEM HCL ER COATED BEADS 240 MG PO CP24
240.0000 mg | ORAL_CAPSULE | Freq: Every day | ORAL | Status: DC
  Administered 2020-12-18 – 2021-01-04 (×17): 240 mg via ORAL
  Filled 2020-12-18: qty 2
  Filled 2020-12-18 (×3): qty 1
  Filled 2020-12-18 (×3): qty 2
  Filled 2020-12-18 (×3): qty 1
  Filled 2020-12-18 (×2): qty 2
  Filled 2020-12-18: qty 1
  Filled 2020-12-18 (×2): qty 2
  Filled 2020-12-18 (×2): qty 1
  Filled 2020-12-18: qty 2
  Filled 2020-12-18 (×7): qty 1
  Filled 2020-12-18: qty 2
  Filled 2020-12-18: qty 1
  Filled 2020-12-18 (×6): qty 2
  Filled 2020-12-18: qty 1

## 2020-12-18 MED ORDER — THIAMINE HCL 100 MG PO TABS
100.0000 mg | ORAL_TABLET | Freq: Every day | ORAL | Status: DC
  Administered 2020-12-18 – 2021-01-04 (×15): 100 mg via ORAL
  Filled 2020-12-18 (×18): qty 1

## 2020-12-18 MED ORDER — OXYCODONE-ACETAMINOPHEN 5-325 MG PO TABS
1.0000 | ORAL_TABLET | Freq: Four times a day (QID) | ORAL | Status: DC | PRN
  Administered 2020-12-18 – 2020-12-24 (×9): 1 via ORAL
  Filled 2020-12-18 (×9): qty 1

## 2020-12-18 MED ORDER — SODIUM CHLORIDE 0.9 % IV SOLN
2.0000 g | INTRAVENOUS | Status: DC
  Administered 2020-12-19 – 2020-12-28 (×10): 2 g via INTRAVENOUS
  Filled 2020-12-18: qty 2
  Filled 2020-12-18: qty 20
  Filled 2020-12-18 (×2): qty 2
  Filled 2020-12-18: qty 20
  Filled 2020-12-18: qty 2
  Filled 2020-12-18: qty 20
  Filled 2020-12-18 (×4): qty 2
  Filled 2020-12-18: qty 20

## 2020-12-18 MED ORDER — POTASSIUM CHLORIDE IN NACL 20-0.9 MEQ/L-% IV SOLN
INTRAVENOUS | Status: DC
  Filled 2020-12-18 (×3): qty 1000

## 2020-12-18 NOTE — ED Notes (Signed)
Lab called to collect morning blood work

## 2020-12-18 NOTE — ED Notes (Signed)
Pt NAD in bed 4, a/ox4. Pt states he has had n/v/d and general weakness and this is why he came in. Pt states could no longer get off the couch. Denies current ABD pain, n/v/d symptoms. VSS. States all needs being met at this time.

## 2020-12-18 NOTE — ED Notes (Signed)
Pt given meal tray.

## 2020-12-18 NOTE — ED Notes (Signed)
Pt given ginger ale and ice per request

## 2020-12-18 NOTE — ED Notes (Signed)
Spoke face to face with MD, showed critical blood culture values. No new orders. Pt currently on broad spectrum abx. Awaiting sensitivity results for possible change in medications.

## 2020-12-18 NOTE — ED Notes (Addendum)
This RN at bedside to introduce self to pt. Pt LR IVF infusing through accessed port. Pt platelet infusion completed prior to this RNs shift but documentation was not complete. Pt lights remain dim to promote comfort. Pt monitoring cords off per pt request. Pt denies any needs at this time

## 2020-12-18 NOTE — Progress Notes (Addendum)
PROGRESS NOTE    Scott Jordan  EHU:[id number] DOB: 1950/10/24 DOA: 12/17/2020 PCP: Scott Server, MD   Chief complaint.  Nausea and vomiting. Brief Narrative:  Scott Jordan is a 70 y.o. 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 Upon arriving the hospital, patient was found to be septic with elevated respirate, tachycardia and severe neutropenia.  She was placed on broad-spectrum antibiotics.  Blood culture came back positive for Klebsiella, antibiotic switched to Rocephin.  Assessment & Plan:   Principal Problem:   Sepsis ([place]) Active Problems:   Protein-calorie malnutrition, severe   Small cell lung cancer ([place])   Bone metastasis ([place])   Chemotherapy induced neutropenia ([facility])   Antineoplastic chemotherapy induced anemia   Thrombocytopenia ([facility])   Atypical atrial flutter ([facility])   Chronic anticoagulation   Alcohol abuse   History of substance abuse ([place])   Hypertension, essential   B12 deficiency  #1.  Neutropenic sepsis. Septicemia secondary to Klebsiella pneumoniae. Probable Klebsiella pneumonia. Patient blood culture came back positive for Klebsiella pneumoniae.  This most likely origin from pulmonary source. Antibiotic switched to Rocephin. Patient also has distended gallbladder on CT scan, obtain right upper quadrant ultrasound to rule out cholecystitis. I reviewed the patient CT scan and chest x-ray, no clear-cut pneumonia.  But  pneumonia probably is still the source of bacteremia.  #2.  Small cell lung cancer. Pancytopenia. Patient has severe neutropenia with critical  thrombocytopenia and neutropenia.  Received platelet transfusion. Oncology is consulted.  3.  Paroxysmal atrial fibrillation. Not a candidate for anticoagulation due to severe thrombocytopenia.  #4.  Severe protein calorie malnutrition. Start protein supplement.  #5.  Acute kidney injury. Hypokalemia. Hyponatremia. Appears to be secondary to dehydration.  Continue some fluids with added potassium.  Potassium also repleted.  #6.  Alcohol use disorder. Continue thiamine folic acid.  Watch for withdrawal.  DVT prophylaxis: SCDs Code Status: full Family Communication:  Disposition Plan:    Status is: Inpatient  Remains inpatient appropriate because:IV treatments appropriate due to intensity of illness or inability to take PO and Inpatient level of care appropriate due to severity of illness  Dispo: The patient is from: Home              Anticipated d/c is to: Home              Patient currently is not medically stable to d/c.   Difficult to place patient No        I/O last 3 completed shifts: In: 887 [I.V.:300; Blood:587] Out: -  No intake/output data recorded.     Consultants:  Oncology  Procedures: None  Antimicrobials: Rocephin.  Subjective: Patient does not feel well today, very tired.  Poor appetite.  No fever or chills. No abdominal pain or nausea vomiting. No short of breath, mild cough, nonproductive. No dysuria or hematuria. No headache or dizziness.   Objective: Vitals:   12/18/20 0130 12/18/20 0246 12/18/20 0400 12/18/20 0741  BP: 131/86  (!) 129/94 (!) 145/104  Pulse: 82  76 (!) 127  Resp: 18  16 17   Temp: 98.5 F (36.9 C)  98.2 F (36.8 C)   TempSrc: Oral  Oral   SpO2: 92% 94% 94%  99%  Weight:      Height:        Intake/Output Summary (Last 24 hours) at 12/18/2020 1251 Last data filed at 12/18/2020 0402 Gross per 24 hour  Intake 887 ml  Output --  Net 887 ml   Filed Weights   12/17/20 1203  Weight: 61.7 kg     Examination:  General exam: Appears calm and comfortable, severely malnourished. Respiratory system: Clear to auscultation. Respiratory effort normal. Cardiovascular system: S1 & S2 heard, RRR. No JVD, murmurs, rubs, gallops or clicks. No pedal edema. Gastrointestinal system: Abdomen is nondistended, soft and nontender. No organomegaly or masses felt. Normal bowel sounds heard. Central nervous system: Alert and oriented x3. No focal neurological deficits. Extremities: Symmetric 5 x 5 power. Skin: No rashes, lesions or ulcers Psychiatry: Mood & affect appropriate.     Data Reviewed: I have personally reviewed following labs and imaging studies  CBC: Recent Labs  Lab 12/17/20 1205 12/17/20 2000 12/18/20 0254 12/18/20 0857  WBC 0.2* 0.4*  --  0.4*  NEUTROABS 0.0*  --   --  0.1*  HGB 10.1* 9.8*  --  9.2*  HCT 27.0* 26.1*  --  25.6*  MCV 95.4 96.3  --  94.1  PLT 7* 8* 28* 16*   Basic Metabolic Panel: Recent Labs  Lab 12/17/20 1205 12/18/20 0500  NA 136 135  K 3.3* 3.0*  CL 101 101  CO2 22 21*  GLUCOSE 209* 166*  BUN 31* 48*  CREATININE 1.45* 1.46*  CALCIUM 8.0* 7.4*   GFR: Estimated Creatinine Clearance: 41.7 mL/min (A) (by C-G formula based on SCr of 1.46 mg/dL (H)). Liver Function Tests: Recent Labs  Lab 12/17/20 1205  AST 51*  ALT 32  ALKPHOS 50  BILITOT 1.8*  PROT 6.3*  ALBUMIN 3.1*   No results for input(s): LIPASE, AMYLASE in the last 168 hours. No results for input(s): AMMONIA in the last 168 hours. Coagulation Profile: Recent Labs  Lab 12/17/20 1205 12/18/20 0500  INR 1.5* 1.6*   Cardiac Enzymes: No results for input(s): CKTOTAL, CKMB, CKMBINDEX, TROPONINI in the last 168 hours. BNP (last 3 results) No results for input(s): PROBNP in the last 8760 hours. HbA1C: No results for input(s): HGBA1C in the last 72 hours. CBG: No results for input(s): GLUCAP in the last 168 hours. Lipid Profile: No results for input(s): CHOL, HDL, LDLCALC,  TRIG, CHOLHDL, LDLDIRECT in the last 72 hours. Thyroid Function Tests: No results for input(s): TSH, T4TOTAL, FREET4, T3FREE, THYROIDAB in the last 72 hours. Anemia Panel: No results for input(s): VITAMINB12, FOLATE, FERRITIN, TIBC, IRON, RETICCTPCT in the last 72 hours. Sepsis Labs: Recent Labs  Lab 12/17/20 1205 12/17/20 1532 12/17/20 1926 12/18/20 0500  PROCALCITON  --   --   --  24.39  LATICACIDVEN 4.0* 5.4* 6.1*  --     Recent Results (from the past 240 hour(s))  Blood Culture (routine x 2)     Status: None (Preliminary result)   Collection Time: 12/17/20 12:05 PM   Specimen: BLOOD  Result Value Ref Range Status   Specimen Description BLOOD LEFT CHEST PORT  Final   Special Requests   Final    BOTTLES DRAWN AEROBIC AND ANAEROBIC Blood Culture adequate volume   Culture  Setup Time   Final    GRAM NEGATIVE RODS IN BOTH AEROBIC AND ANAEROBIC BOTTLES CRITICAL VALUE NOTED.  VALUE IS CONSISTENT WITH PREVIOUSLY REPORTED AND CALLED VALUE. Performed at [facility], [place], [place]  [address]    Culture GRAM NEGATIVE RODS  Final   Report Status PENDING  Incomplete  Blood Culture (routine x 2)     Status: None (Preliminary result)   Collection Time: 12/17/20 12:05 PM   Specimen: BLOOD  Result Value Ref Range Status   Specimen Description BLOOD LEFT CHEST  Final   Special Requests   Final    BOTTLES DRAWN AEROBIC AND ANAEROBIC Blood Culture adequate volume   Culture  Setup Time   Final    GRAM NEGATIVE RODS IN BOTH AEROBIC AND ANAEROBIC BOTTLES Organism ID to follow CRITICAL RESULT CALLED TO, READ BACK BY AND VERIFIED WITH: [place] [phone] 12/18/20 HNM Performed at [facility], [address] [place], [place] [address]    Culture GRAM NEGATIVE RODS  Final   Report Status PENDING  Incomplete  Blood Culture ID Panel (Reflexed)     Status: Abnormal   Collection Time: 12/17/20 12:05 PM  Result Value Ref Range Status   Enterococcus  faecalis NOT DETECTED NOT DETECTED Final   Enterococcus Faecium NOT DETECTED NOT DETECTED Final   Listeria monocytogenes NOT DETECTED NOT DETECTED Final   Staphylococcus species NOT DETECTED NOT DETECTED Final   Staphylococcus aureus (BCID) NOT DETECTED NOT DETECTED Final   Staphylococcus epidermidis NOT DETECTED NOT DETECTED Final   Staphylococcus lugdunensis NOT DETECTED NOT DETECTED Final   Streptococcus species NOT DETECTED NOT DETECTED Final   Streptococcus agalactiae NOT DETECTED NOT DETECTED Final   Streptococcus pneumoniae NOT DETECTED NOT DETECTED Final   Streptococcus pyogenes NOT DETECTED NOT DETECTED Final   A.calcoaceticus-baumannii NOT DETECTED NOT DETECTED Final   Bacteroides fragilis NOT DETECTED NOT DETECTED Final   Enterobacterales DETECTED (A) NOT DETECTED Final    Comment: Enterobacterales represent a large order of gram negative bacteria, not a single organism. CRITICAL RESULT CALLED TO, READ BACK BY AND VERIFIED WITH: [place] RN [phone] 12/18/20 HNM    Enterobacter cloacae complex NOT DETECTED NOT DETECTED Final   Escherichia coli NOT DETECTED NOT DETECTED Final   Klebsiella aerogenes NOT DETECTED NOT DETECTED Final   Klebsiella oxytoca NOT DETECTED NOT DETECTED Final   Klebsiella pneumoniae DETECTED (A) NOT DETECTED Final    Comment: CRITICAL RESULT CALLED TO, READ BACK BY AND VERIFIED WITH: [place] RN [phone] 12/18/20 HNM    Proteus species NOT DETECTED NOT DETECTED Final   Salmonella species NOT DETECTED NOT DETECTED Final   Serratia marcescens NOT DETECTED NOT DETECTED Final   Haemophilus influenzae NOT DETECTED NOT DETECTED Final   Neisseria meningitidis NOT DETECTED NOT DETECTED Final   Pseudomonas aeruginosa NOT DETECTED NOT DETECTED Final   Stenotrophomonas maltophilia NOT DETECTED NOT DETECTED Final   Candida albicans NOT DETECTED NOT DETECTED Final   Candida auris NOT DETECTED NOT DETECTED Final   Candida glabrata NOT DETECTED NOT DETECTED Final    Candida krusei NOT DETECTED NOT DETECTED Final   Candida parapsilosis NOT DETECTED NOT DETECTED Final   Candida tropicalis NOT DETECTED NOT DETECTED Final   Cryptococcus neoformans/gattii NOT DETECTED NOT DETECTED Final   CTX-M ESBL NOT DETECTED NOT DETECTED Final   Carbapenem resistance IMP NOT DETECTED NOT DETECTED Final   Carbapenem resistance KPC NOT DETECTED NOT DETECTED Final   Carbapenem resistance NDM NOT DETECTED NOT DETECTED Final   Carbapenem resist OXA 48 LIKE NOT DETECTED NOT DETECTED Final   Carbapenem resistance VIM NOT DETECTED NOT DETECTED Final    Comment: Performed at [facility], [place], [place]  [address]  Resp Panel by RT-PCR (Flu A&B, Covid) Nasopharyngeal Swab     Status: None   Collection Time: 12/17/20 12:37 PM   Specimen: Nasopharyngeal Swab; Nasopharyngeal(NP) swabs in vial transport medium  Result Value Ref Range Status   SARS Coronavirus 2 by RT PCR NEGATIVE NEGATIVE Final    Comment: (NOTE) SARS-CoV-2 target nucleic acids are NOT DETECTED.  The SARS-CoV-2 RNA is generally detectable in upper respiratory specimens during the acute phase of infection. The lowest concentration of SARS-CoV-2 viral copies this assay can detect is 138 copies/mL. A negative result does not preclude SARS-Cov-2 infection and should not be used as the sole basis for treatment or other patient management decisions. A negative result may occur with  improper specimen collection/handling, submission of specimen other than nasopharyngeal swab, presence of viral mutation(s) within the areas targeted by this assay, and inadequate number of viral copies(<138 copies/mL). A negative result must be combined with clinical observations, patient history, and epidemiological information. The expected result is Negative.  Fact Sheet for Patients:  EntrepreneurPulse.com.au  Fact Sheet for Healthcare Providers:   IncredibleEmployment.be  This test is no t yet approved or cleared by the [place] FDA and  has been authorized for detection and/or diagnosis of SARS-CoV-2 by FDA under an Emergency Use Authorization (EUA). This EUA will remain  in effect (meaning this test can be used) for the duration of the COVID-19 declaration under Section 564(b)(1) of the Act, 21 U.S.C.section 360bbb-3(b)(1), unless the authorization is terminated  or revoked sooner.       Influenza A by PCR NEGATIVE NEGATIVE Final   Influenza B by PCR NEGATIVE NEGATIVE Final    Comment: (NOTE) The Xpert Xpress SARS-CoV-2/FLU/RSV plus assay is intended as an aid in the diagnosis of influenza from Nasopharyngeal swab specimens and should not be used as a sole basis for treatment. Nasal washings and aspirates are unacceptable for Xpert Xpress SARS-CoV-2/FLU/RSV testing.  Fact Sheet for Patients: EntrepreneurPulse.com.au  Fact Sheet for Healthcare Providers: IncredibleEmployment.be  This test is not yet approved or cleared by the [place] FDA and has been authorized for detection and/or diagnosis of SARS-CoV-2 by FDA under an Emergency Use Authorization (EUA). This EUA will remain in effect (meaning this test can be used) for the duration of the COVID-19 declaration under Section 564(b)(1) of the Act, 21 U.S.C. section 360bbb-3(b)(1), unless the authorization is terminated or revoked.  Performed at [facility], [place], [place] [address]   C Difficile Quick Screen w PCR reflex     Status: None   Collection Time: 12/17/20  3:32 PM   Specimen: STOOL  Result Value Ref Range Status   C Diff antigen NEGATIVE NEGATIVE Final   C Diff toxin NEGATIVE NEGATIVE Final   C Diff interpretation No C. difficile detected.  Final    Comment: Performed at [facility], [address] [place], [place] [address]         Radiology  Studies: CT ABDOMEN PELVIS WO CONTRAST  Result Date: 12/17/2020 CLINICAL DATA:  Nausea and vomiting EXAM: CT ABDOMEN AND PELVIS WITHOUT CONTRAST TECHNIQUE: Multidetector CT imaging of the abdomen and pelvis was performed following the standard protocol without IV contrast. COMPARISON:  CT 06/23/2020, PET CT 11/19/2020 FINDINGS: Lower chest: Lung bases demonstrate emphysema. Subpleural bandlike airspace disease at the left lower lobe redemonstrated. Incompletely visualized subpleural peripheral left upper lobe nodule measuring 11 mm, series 5, image 1. Cardiomegaly with trace pericardial effusion. Coronary vascular calcification. Hepatobiliary: Extensive  motion degradation. No focal hepatic abnormality. Gallstones. Gallbladder slightly distended. Possible wall thickening or pericholecystic fluid though limited by motion Pancreas: Unremarkable. No pancreatic ductal dilatation or surrounding inflammatory changes. Spleen: Normal in size without focal abnormality. Adrenals/Urinary Tract: Thickened appearance of adrenal glands. No hydronephrosis. The bladder is unremarkable Stomach/Bowel: Stomach nonenlarged. No dilated small bowel. Negative appendix. No acute bowel wall thickening Vascular/Lymphatic: Advanced aortic atherosclerosis. No aneurysm. Slightly increased multiple small retroperitoneal lymph nodes. Reproductive: Prostate is unremarkable. Other: Negative for free air or free fluid Musculoskeletal: No acute osseous abnormality. IMPRESSION: 1. Degraded by motion. 2. Gallbladder appears slightly distended and there are gallstones. Possible gallbladder wall thickening or pericholecystic fluid though limited by motion, consider correlation with ultrasound 3. Cardiomegaly with trace pericardial effusion. 4. Similar subpleural bandlike density at left lower lobe. Incompletely visualized peripheral left upper lobe lung nodule characterized on recent PET CT Electronically Signed   By: Donavan Foil M.D.   On: 12/17/2020  16:50   CT HEAD WO CONTRAST (5MM)  Result Date: 12/17/2020 CLINICAL DATA:  Nausea vomiting EXAM: CT HEAD WITHOUT CONTRAST TECHNIQUE: Contiguous axial images were obtained from the base of the skull through the vertex without intravenous contrast. COMPARISON:  CT brain 06/24/2020, MRI 03/10/2020, head CT 11/22/2019 FINDINGS: Brain: No acute territorial infarction, hemorrhage or intracranial mass. Extensive white matter disease, progressed from more remote exams from 2021. Stable ventricle size. Chronic lacunar infarcts within the left white matter. Vascular: No hyperdense vessel.  Carotid vascular calcification Skull: Normal. Negative for fracture or focal lesion. Sinuses/Orbits: Mucosal thickening the sinuses. Chronic appearing nasal deformity Other: None IMPRESSION: 1. No definite CT evidence for acute intracranial abnormality. 2. Atrophy. Extensive white matter disease, grossly stable as compared with recent head CT from March, appears progressive when compared to exams from 2021 and prior Electronically Signed   By: Donavan Foil M.D.   On: 12/17/2020 16:39   DG Chest Port 1 View  Result Date: 12/17/2020 CLINICAL DATA:  Questionable sepsis.  Weakness EXAM: PORTABLE CHEST 1 VIEW COMPARISON:  03/09/2020 FINDINGS: Left subclavian approached chest port remains in place. Stable cardiomegaly. Chronic left basilar scarring or atelectasis. Peripheral left upper lobe nodule better seen on prior CT. No new airspace consolidation. Pleural effusion or pneumothorax. IMPRESSION: Chronic left basilar scarring or atelectasis. No new or acute findings. Electronically Signed   By: Davina Poke D.O.   On: 12/17/2020 13:20        Scheduled Meds:  sodium chloride   Intravenous Once   diltiazem  240 mg Oral Daily   feeding supplement  237 mL Oral TID BM   Continuous Infusions:  ceFEPime (MAXIPIME) IV Stopped (12/18/20 0555)   [START ON 12/19/2020] cefTRIAXone (ROCEPHIN)  IV     lactated ringers 1,000 mL  (12/18/20 1245)     LOS: 1 day    Time spent: 32 minutes    Sharen Hones, MD [facility]   To contact the attending provider between 7A-7P or the covering provider during after hours 7P-7A, please log into the web site [email or web address] and access using universal [place] password for that web site. If you do not have the password, please call the hospital operator.  12/18/2020, 12:51 PM

## 2020-12-18 NOTE — Progress Notes (Signed)
Patient arrived to ICU room 7 from ED. GCS 15, room air, Heart rate ST 120, BP 130/100. CHG wipe given. Connected to tele. Pain medication requested from provider. Erling Conte, RN

## 2020-12-18 NOTE — ED Notes (Signed)
Family updated as to patient's status.

## 2020-12-18 NOTE — Progress Notes (Addendum)
Hematology/Oncology Consult note [facility] [phone] [phone]  Patient Care Team: Earlie Server, MD as PCP - General (Oncology) Telford Nab, RN as Registered Nurse Erby Pian, MD as Referring Physician (Specialist) Teodoro Spray, MD as Consulting Physician (Cardiology) Anthonette Legato, MD as Consulting Physician (Nephrology) Vladimir Crofts, MD as Consulting Physician (Neurology) Meade Maw, MD as Consulting Physician (Neurosurgery)   Name of the patient: Scott Jordan  [id number]  06/02/1950   Date of visit: 12/18/20 REASON FOR COSULTATION:  Pancytopenia, requested by Dr.Cox.  History of presenting illness-  70 y.o. male with PMH listed at below who was instructed by me to go to ER for evaluation of hematemesis.  Patient has extensive small cell lung cancer, recently had a recurrence and a status post cycle 1 carboplatin/etoposide with Neulasta support.  He got Neulasta on 12/11/2020. Patient reports feeling extremely weak a few days after the chemotherapy, had episode of hematic emesis, nausea and vomiting.  No fever patient and family members called cancer center and was instructed to go to emergency room for evaluation. In ER, patient was found to have a temperature of 98.3, tachypnea with respiratory rate of 43, heart rate 85, BP 131/58, SPO2 95% on room air. Blood work in the emergency room reviewed WBC 22, ANC 0, platelet count of 7000.  Hemoglobin 10.1.  Patient received 1 unit of platelet  I was contacted by Dr. Tobie Poet in the ED for consultation. I recommend supportive care, broad-spectrum antibiotics, sepsis work-up, platelet transfusion to keep count above 10,000, or higher if active bleeding.  12/17/2020, CT head without contrast showed no definite CT evidence of acute intracranial abnormality.  Atrophy. CT abdomen pelvis without contrast showed degraded by motion, gallbladder appears slightly distended.  There are  gallstones.  Possible gallbladder wall thickening or pericholecystic fluid though limited by motion.  Cardiomegaly with trace pericardial effusion.  Similar subpleural bandlike density at the left lower lobe.  Incompletely visualized peripheral left upper lobe lung nodule  Patient was seen by me today.  He continues to feel very fatigued and tired.  Afebrile today.  Appetite is very poor. Denies any pain currently. Review of Systems  Constitutional:  Positive for appetite change, fatigue and unexpected weight change. Negative for chills and fever.  HENT:   Negative for hearing loss and voice change.   Eyes:  Negative for eye problems and icterus.  Respiratory:  Negative for chest tightness, cough and shortness of breath.   Cardiovascular:  Negative for chest pain and leg swelling.  Gastrointestinal:  Positive for nausea and vomiting. Negative for abdominal distention and abdominal pain.  Endocrine: Negative for hot flashes.  Genitourinary:  Negative for difficulty urinating, dysuria and frequency.   Musculoskeletal:  Negative for arthralgias.  Skin:  Negative for itching and rash.  Neurological:  Negative for light-headedness and numbness.  Hematological:  Negative for adenopathy. Does not bruise/bleed easily.  Psychiatric/Behavioral:  Negative for confusion.    No Known Allergies  Patient Active Problem List   Diagnosis Date Noted   Septicemia due to Klebsiella pneumoniae ([place]) 12/18/2020   Neutropenic sepsis ([place]) 12/17/2020   Head trauma 06/24/2020   Ulnar neuropathy of left upper extremity 05/13/2020   Cervical radiculopathy 05/13/2020   B12 deficiency 04/06/2020   Weight loss 03/23/2020   Neuropathy of left upper extremity 03/22/2020   Hepatitis B infection without delta agent without hepatic coma 12/30/2019   Chronic hepatitis C without hepatic coma ([place]) 12/30/2019   Chronic anticoagulation  12/23/2019   Elevated LFTs 12/23/2019   Hypokalemia 12/08/2019   Acute respiratory  failure with hypoxia ([place]) 11/23/2019   Heroin overdose, accidental or unintentional, initial encounter ([place]) 11/23/2019   Aspiration pneumonia ([place]) 11/23/2019   Pancytopenia ([place]) 11/23/2019   Atypical atrial flutter ([place]) 11/23/2019   Hypomagnesemia 10/21/2019   Antineoplastic chemotherapy induced anemia 10/13/2019   Dehydration 10/13/2019   Thrombocytopenia ([place]) 10/13/2019   Chemotherapy induced neutropenia ([place]) 10/10/2019   Anemia 09/11/2019   Cancer-related pain 09/05/2019   Encounter for antineoplastic chemotherapy 09/02/2019   Encounter for antineoplastic immunotherapy 09/02/2019   Bone metastasis ([place]) 09/02/2019   Goals of care, counseling/discussion 08/26/2019   Small cell lung cancer ([place]) 08/23/2019   Protein-calorie malnutrition, severe 08/19/2019   Hyponatremia    Dyspnea    Pleural effusion    Recurrent pleural effusion on left    Malignant pleural effusion 08/17/2019   Alcohol abuse 07/21/2019   History of substance abuse ([place]) 06/18/2019   Hypertension, essential 06/18/2019   Malnutrition of moderate degree 01/15/2019   Elevated troponin 01/14/2019     Past Medical History:  Diagnosis Date   A-fib ([place]) 01/10/2019   pt st this was dx by Dr. Ubaldo Glassing   Cancer [facility]) [id number]   LUNG   Complication of anesthesia    DIFFICULTY WAKING UP AFTER SURGERY- 86 YRS AGO   Hypertension    Lung cancer ([place])    Substance abuse ([place])      Past Surgical History:  Procedure Laterality Date   ARM DEBRIDEMENT Left    INCISION AND DEBRIDEMENT LOWER ARM -80 YRS AGO   BACK SURGERY     PORTACATH PLACEMENT Left 08/29/2019   Procedure: INSERTION PORT-A-CATH;  Surgeon: Nestor Lewandowsky, MD;  Location: [facility] ORS;  Service: General;  Laterality: Left;    Social History   Socioeconomic History   Marital status: Single    Spouse name: Not on file   Number of children: Not on file   Years of education: Not on file   Highest education level: Not on file  Occupational History   Not  on file  Tobacco Use   Smoking status: Former    Packs/day: 0.50    Types: Cigarettes   Smokeless tobacco: Never   Tobacco comments:    not smoked since 6 weeks ago  Vaping Use   Vaping Use: Never used  Substance and Sexual Activity   Alcohol use: Yes    Comment: 29 OZ IN WEEK   Drug use: Not Currently    Types: Heroin    Comment: heroin WITHIN PAST YEAR   Sexual activity: Not on file  Other Topics Concern   Not on file  Social History Narrative   Not on file   Social Determinants of Health   Financial Resource Strain: Not on file  Food Insecurity: Not on file  Transportation Needs: Not on file  Physical Activity: Not on file  Stress: Not on file  Social Connections: Not on file  Intimate Partner Violence: Not on file     History reviewed. No pertinent family history.   Current Facility-Administered Medications:    0.9 %  sodium chloride infusion (Manually program via Guardrails IV Fluids), , Intravenous, Once, Cox, Amy N, DO   0.9 % NaCl with KCl 20 mEq/ L  infusion, , Intravenous, Continuous, Sharen Hones, MD, Last Rate: 75 mL/hr at 12/18/20 1348, New Bag at 12/18/20 1348   acetaminophen (TYLENOL) tablet 650 mg, 650 mg, Oral, Q6H PRN **OR** acetaminophen (  TYLENOL) suppository 650 mg, 650 mg, Rectal, Q6H PRN, Cox, Amy N, DO   ceFEPIme (MAXIPIME) 2 g in sodium chloride 0.9 % 100 mL IVPB, 2 g, Intravenous, Q12H, Sharen Hones, MD, Stopped at 12/18/20 0555   [START ON 12/19/2020] cefTRIAXone (ROCEPHIN) 2 g in sodium chloride 0.9 % 100 mL IVPB, 2 g, Intravenous, Q24H, Sharen Hones, MD   diltiazem (CARDIZEM CD) 24 hr capsule 240 mg, 240 mg, Oral, Daily, Cox, Amy N, DO, 240 mg at 12/18/20 1026   diltiazem (CARDIZEM) injection 10 mg, 10 mg, Intravenous, Q2H PRN, Cox, Amy N, DO   feeding supplement (ENSURE ENLIVE / ENSURE PLUS) liquid 237 mL, 237 mL, Oral, TID BM, Cox, Amy N, DO   fentaNYL (SUBLIMAZE) injection 12.5 mcg, 12.5 mcg, Intravenous, Q2H PRN, Cox, Amy N, DO, 12.5 mcg at  78/58/85 0277   folic acid (FOLVITE) tablet 1 mg, 1 mg, Oral, Daily, Sharen Hones, MD, 1 mg at 12/18/20 1348   ondansetron (ZOFRAN) tablet 4 mg, 4 mg, Oral, Q6H PRN **OR** ondansetron (ZOFRAN) injection 4 mg, 4 mg, Intravenous, Q6H PRN, Cox, Amy N, DO   potassium chloride (KLOR-CON) packet 40 mEq, 40 mEq, Oral, BID, Sharen Hones, MD, 40 mEq at 12/18/20 1347   thiamine tablet 100 mg, 100 mg, Oral, Daily, Sharen Hones, MD, 100 mg at 12/18/20 1348  Current Outpatient Medications:    diltiazem (TIAZAC) 240 MG 24 hr capsule, Take 1 capsule by mouth daily., Disp: , Rfl:    ELIQUIS 5 MG TABS tablet, Take 5 mg by mouth 2 (two) times daily., Disp: , Rfl:    magnesium oxide (MAG-OX) 400 MG tablet, Take by mouth., Disp: , Rfl:    potassium chloride SA (KLOR-CON) 20 MEQ tablet, TAKE 1 TABLET BY MOUTH DAILY FOR 3 DAYS, Disp: 3 tablet, Rfl: 0   prochlorperazine (COMPAZINE) 10 MG tablet, Take 1 tablet (10 mg total) by mouth every 6 (six) hours as needed for nausea or vomiting., Disp: 90 tablet, Rfl: 0   feeding supplement, ENSURE ENLIVE, (ENSURE ENLIVE) LIQD, Take 237 mLs by mouth 3 (three) times daily between meals., Disp: 237 mL, Rfl: 12   lidocaine-prilocaine (EMLA) cream, Apply to affected area once (Patient not taking: No sig reported), Disp: 30 g, Rfl: 3   MAVYRET 100-40 MG TABS, Take 3 tablets by mouth daily. (Patient not taking: No sig reported), Disp: , Rfl:    mirtazapine (REMERON) 15 MG tablet, Take by mouth. (Patient not taking: No sig reported), Disp: , Rfl:    naloxone (NARCAN) nasal spray 4 mg/0.1 mL, once as needed (Patient not taking: No sig reported), Disp: , Rfl:    ondansetron (ZOFRAN) 8 MG tablet, Take 1 tablet (8 mg total) by mouth every 8 (eight) hours as needed for refractory nausea / vomiting. Start on day 3 after carboplatin chemo. (Patient not taking: No sig reported), Disp: 30 tablet, Rfl: 1  Facility-Administered Medications Ordered in Other Encounters:    heparin lock flush 100  unit/mL, 500 Units, Intravenous, Once, Corcoran, Melissa C, MD   sodium chloride flush (NS) 0.9 % injection 10 mL, 10 mL, Intravenous, PRN, Nolon Stalls C, MD, 10 mL at 11/11/19 0812   Physical exam:  Vitals:   12/18/20 0246 12/18/20 0400 12/18/20 0741 12/18/20 1245  BP:  (!) 129/94 (!) 145/104 (!) 150/117  Pulse:  76 (!) 127 (!) 52  Resp:  16 17 16   Temp:  98.2 F (36.8 C)  (!) 97 F (36.1 C)  TempSrc:  Oral  Oral  SpO2: 94% 94% 99% 92%  Weight:      Height:       Physical Exam Constitutional:      General: He is not in acute distress.    Appearance: He is not diaphoretic.  HENT:     Head: Normocephalic and atraumatic.     Nose: Nose normal.     Mouth/Throat:     Pharynx: No oropharyngeal exudate.  Eyes:     General: No scleral icterus.    Pupils: Pupils are equal, round, and reactive to light.  Cardiovascular:     Rate and Rhythm: Normal rate and regular rhythm.     Heart sounds: No murmur heard. Pulmonary:     Effort: Pulmonary effort is normal. No respiratory distress.     Breath sounds: No rales.  Chest:     Chest wall: No tenderness.  Abdominal:     General: There is no distension.     Palpations: Abdomen is soft.     Tenderness: There is no abdominal tenderness.  Musculoskeletal:        General: Normal range of motion.     Cervical back: Normal range of motion and neck supple.  Skin:    General: Skin is warm and dry.     Findings: No erythema.  Neurological:     Mental Status: He is alert and oriented to person, place, and time. Mental status is at baseline.     Cranial Nerves: No cranial nerve deficit.     Motor: No abnormal muscle tone.     Coordination: Coordination normal.  Psychiatric:        Mood and Affect: Affect normal.        CMP Latest Ref Rng & Units 12/18/2020  Glucose 70 - 99 mg/dL 166(H)  BUN 8 - 23 mg/dL 48(H)  Creatinine 0.61 - 1.24 mg/dL 1.46(H)  Sodium 135 - 145 mmol/L 135  Potassium 3.5 - 5.1 mmol/L 3.0(L)  Chloride 98 -  111 mmol/L 101  CO2 22 - 32 mmol/L 21(L)  Calcium 8.9 - 10.3 mg/dL 7.4(L)  Total Protein 6.5 - 8.1 g/dL -  Total Bilirubin 0.3 - 1.2 mg/dL -  Alkaline Phos 38 - 126 U/L -  AST 15 - 41 U/L -  ALT 0 - 44 U/L -   CBC Latest Ref Rng & Units 12/18/2020  WBC 4.0 - 10.5 K/uL 0.4(LL)  Hemoglobin 13.0 - 17.0 g/dL 9.2(L)  Hematocrit 39.0 - 52.0 % 25.6(L)  Platelets 150 - 400 K/uL 16(LL)    RADIOGRAPHIC STUDIES: I have personally reviewed the radiological images as listed and agreed with the findings in the report. CT ABDOMEN PELVIS WO CONTRAST  Result Date: 12/17/2020 CLINICAL DATA:  Nausea and vomiting EXAM: CT ABDOMEN AND PELVIS WITHOUT CONTRAST TECHNIQUE: Multidetector CT imaging of the abdomen and pelvis was performed following the standard protocol without IV contrast. COMPARISON:  CT 06/23/2020, PET CT 11/19/2020 FINDINGS: Lower chest: Lung bases demonstrate emphysema. Subpleural bandlike airspace disease at the left lower lobe redemonstrated. Incompletely visualized subpleural peripheral left upper lobe nodule measuring 11 mm, series 5, image 1. Cardiomegaly with trace pericardial effusion. Coronary vascular calcification. Hepatobiliary: Extensive motion degradation. No focal hepatic abnormality. Gallstones. Gallbladder slightly distended. Possible wall thickening or pericholecystic fluid though limited by motion Pancreas: Unremarkable. No pancreatic ductal dilatation or surrounding inflammatory changes. Spleen: Normal in size without focal abnormality. Adrenals/Urinary Tract: Thickened appearance of adrenal glands. No hydronephrosis. The bladder is unremarkable Stomach/Bowel: Stomach nonenlarged. No dilated small  bowel. Negative appendix. No acute bowel wall thickening Vascular/Lymphatic: Advanced aortic atherosclerosis. No aneurysm. Slightly increased multiple small retroperitoneal lymph nodes. Reproductive: Prostate is unremarkable. Other: Negative for free air or free fluid Musculoskeletal: No  acute osseous abnormality. IMPRESSION: 1. Degraded by motion. 2. Gallbladder appears slightly distended and there are gallstones. Possible gallbladder wall thickening or pericholecystic fluid though limited by motion, consider correlation with ultrasound 3. Cardiomegaly with trace pericardial effusion. 4. Similar subpleural bandlike density at left lower lobe. Incompletely visualized peripheral left upper lobe lung nodule characterized on recent PET CT Electronically Signed   By: Donavan Foil M.D.   On: 12/17/2020 16:50   CT HEAD WO CONTRAST (5MM)  Result Date: 12/17/2020 CLINICAL DATA:  Nausea vomiting EXAM: CT HEAD WITHOUT CONTRAST TECHNIQUE: Contiguous axial images were obtained from the base of the skull through the vertex without intravenous contrast. COMPARISON:  CT brain 06/24/2020, MRI 03/10/2020, head CT 11/22/2019 FINDINGS: Brain: No acute territorial infarction, hemorrhage or intracranial mass. Extensive white matter disease, progressed from more remote exams from 2021. Stable ventricle size. Chronic lacunar infarcts within the left white matter. Vascular: No hyperdense vessel.  Carotid vascular calcification Skull: Normal. Negative for fracture or focal lesion. Sinuses/Orbits: Mucosal thickening the sinuses. Chronic appearing nasal deformity Other: None IMPRESSION: 1. No definite CT evidence for acute intracranial abnormality. 2. Atrophy. Extensive white matter disease, grossly stable as compared with recent head CT from March, appears progressive when compared to exams from 2021 and prior Electronically Signed   By: Donavan Foil M.D.   On: 12/17/2020 16:39   NM PET Image Restag (PS) Skull Base To Thigh  Result Date: 11/20/2020 CLINICAL DATA:  Subsequent treatment strategy for small-cell lung cancer. EXAM: NUCLEAR MEDICINE PET SKULL BASE TO THIGH TECHNIQUE: 6.8 mCi F-18 FDG was injected intravenously. Full-ring PET imaging was performed from the skull base to thigh after the radiotracer. CT data  was obtained and used for attenuation correction and anatomic localization. Fasting blood glucose: 101 mg/dl COMPARISON:  Chest CT 10/30/2020. PET-CT 03/19/2020. FINDINGS: Mediastinal blood pool activity: SUV max 2.3 Liver activity: SUV max NA NECK: No hypermetabolic lymph nodes in the neck. Incidental CT findings: none CHEST: 13 x 8 mm peripheral left upper lobe pulmonary nodule of concern on recent chest CT shows low level hypermetabolism with SUV max = 2.4. A second hypermetabolic nodule is identified in the deep posterior left costophrenic sulcus (126/3) in an area of presumed scarring not substantially changed since 03/19/2020. SUV max = 5.5. Volume loss superior segment left lower lobe with bandlike opacity as described previously and likely related to post treatment scarring. This shows only low level FDG uptake with SUV max = 1.6. Incidental CT findings: Centrilobular and paraseptal emphysema evident. Heart is enlarged. Left Port-A-Cath tip is positioned in the right atrium. Mild atherosclerotic calcification is noted in the wall of the thoracic aorta. ABDOMEN/PELVIS: No abnormal hypermetabolic activity within the liver, pancreas, adrenal glands, or spleen. No hypermetabolic lymph nodes in the abdomen or pelvis. Incidental CT findings: Probable tiny layering gallstones. There is moderate atherosclerotic calcification of the abdominal aorta without aneurysm. Prominent stool volume. SKELETON: No focal hypermetabolic activity to suggest skeletal metastasis. Low level uptake in the region of the L5-S1 interspace is likely degenerative. Incidental CT findings: none IMPRESSION: 1. 13 mm peripheral left upper lobe nodule identified as progressive on recent diagnostic CT chest shows low level hypermetabolism, borderline between infectious/inflammatory and neoplastic etiology. As low-grade or well differentiated neoplasm can be poorly FDG avid, close  follow-up recommended. 2. More markedly hypermetabolic focus in the  deep posterior left costophrenic sulcus in the region of apparent scarring. This was measured on previous PET-CT with SUV max = 2.5, now increased to 5.5, concerning for recurrent/metastatic disease. 3. Cholelithiasis. Electronically Signed   By: Misty Stanley M.D.   On: 11/20/2020 12:47   DG Chest Port 1 View  Result Date: 12/17/2020 CLINICAL DATA:  Questionable sepsis.  Weakness EXAM: PORTABLE CHEST 1 VIEW COMPARISON:  03/09/2020 FINDINGS: Left subclavian approached chest port remains in place. Stable cardiomegaly. Chronic left basilar scarring or atelectasis. Peripheral left upper lobe nodule better seen on prior CT. No new airspace consolidation. Pleural effusion or pneumothorax. IMPRESSION: Chronic left basilar scarring or atelectasis. No new or acute findings. Electronically Signed   By: Davina Poke D.O.   On: 12/17/2020 13:20   US Abdomen Limited RUQ (LIVER/GB)  Result Date: 12/18/2020 CLINICAL DATA:  Evaluate for cholecystitis. EXAM: ULTRASOUND ABDOMEN LIMITED RIGHT UPPER QUADRANT COMPARISON:  CT AP from 12/17/2020 FINDINGS: Gallbladder: The gallbladder wall is diffusely edematous measuring up to 13.1 mm. Gallstones are noted measuring up to 5.9 mm. Pericholecystic fluid. Positive sonographic Murphy's sign. Common bile duct: Diameter: 6 mm Liver: Diffusely increased parenchymal echogenicity identified. No focal liver abnormality. Portal vein is patent on color Doppler imaging with normal direction of blood flow towards the liver. Other: 1.9 cm right kidney cyst. IMPRESSION: Gallstones, gallbladder wall thickening and pericholecystic fluid with positive sonographic Murphy's sign. Findings are compatible with acute cholecystitis. Electronically Signed   By: Kerby Moors M.D.   On: 12/18/2020 14:18    Assessment and plan-   #Sepsis Blood culture came back positive for Klebsiella pneumonia. UA is positive for nitrate, hemoglobin.  Urine culture is pending. Possible urosepsis vs pneumonia   Continue Rocephin. C diff is negative.  #Chemotherapy-induced thrombocytopenia, Agree with platelet transfusion to keep count above 10,000.  Patient denies any additional hematemesis episode. #Chemotherapy-induced neutropenia, Patient has received Neulasta on 12/11/2020.  No need for additional G-CSF at this point. Neutropenia precaution.    #Chemotherapy-induced anemia, hemoglobin 9.2.  Monitor # Small cell lung cancer, recurrent.  Outpatient follow up.   # AKI, continue IVF.  # Malnutrition, nutritional supplement   Thank you for allowing me to participate in the care of this patient.     Earlie Server, MD, PhD Hematology Oncology [facility] Pager- [phone] 12/18/2020

## 2020-12-18 NOTE — ED Notes (Signed)
Daughter came and left phone number [phone]

## 2020-12-18 NOTE — Progress Notes (Addendum)
PHARMACY - PHYSICIAN COMMUNICATION CRITICAL VALUE ALERT - BLOOD CULTURE IDENTIFICATION (BCID)  Scott Jordan is an 70 y.o. male who presented to [facility] on 12/17/2020 with a chief complaint of N/V.  Note result called to ED RN 9/9 at 0200,  I saw result and contacted Hospitalist   Assessment:  9/8 blood cultures with GNR, BCID detects K. Pneumoniae.  Not ESBL.  Patient receiving chemo for SCLC and in neutropenic  Name of physician (or Provider) Contacted: Dr Roosevelt Locks  Current antibiotics: vancomycin, cefepime, metronidazole  Changes to prescribed antibiotics recommended:  Recommendations accepted by provider - change to ceftriaxone  Results for orders placed or performed during the hospital encounter of 12/17/20  Blood Culture ID Panel (Reflexed) (Collected: 12/17/2020 12:05 PM)  Result Value Ref Range   Enterococcus faecalis NOT DETECTED NOT DETECTED   Enterococcus Faecium NOT DETECTED NOT DETECTED   Listeria monocytogenes NOT DETECTED NOT DETECTED   Staphylococcus species NOT DETECTED NOT DETECTED   Staphylococcus aureus (BCID) NOT DETECTED NOT DETECTED   Staphylococcus epidermidis NOT DETECTED NOT DETECTED   Staphylococcus lugdunensis NOT DETECTED NOT DETECTED   Streptococcus species NOT DETECTED NOT DETECTED   Streptococcus agalactiae NOT DETECTED NOT DETECTED   Streptococcus pneumoniae NOT DETECTED NOT DETECTED   Streptococcus pyogenes NOT DETECTED NOT DETECTED   A.calcoaceticus-baumannii NOT DETECTED NOT DETECTED   Bacteroides fragilis NOT DETECTED NOT DETECTED   Enterobacterales DETECTED (A) NOT DETECTED   Enterobacter cloacae complex NOT DETECTED NOT DETECTED   Escherichia coli NOT DETECTED NOT DETECTED   Klebsiella aerogenes NOT DETECTED NOT DETECTED   Klebsiella oxytoca NOT DETECTED NOT DETECTED   Klebsiella pneumoniae DETECTED (A) NOT DETECTED   Proteus species NOT DETECTED NOT DETECTED   Salmonella species NOT DETECTED NOT DETECTED   Serratia marcescens NOT DETECTED  NOT DETECTED   Haemophilus influenzae NOT DETECTED NOT DETECTED   Neisseria meningitidis NOT DETECTED NOT DETECTED   Pseudomonas aeruginosa NOT DETECTED NOT DETECTED   Stenotrophomonas maltophilia NOT DETECTED NOT DETECTED   Candida albicans NOT DETECTED NOT DETECTED   Candida auris NOT DETECTED NOT DETECTED   Candida glabrata NOT DETECTED NOT DETECTED   Candida krusei NOT DETECTED NOT DETECTED   Candida parapsilosis NOT DETECTED NOT DETECTED   Candida tropicalis NOT DETECTED NOT DETECTED   Cryptococcus neoformans/gattii NOT DETECTED NOT DETECTED   CTX-M ESBL NOT DETECTED NOT DETECTED   Carbapenem resistance IMP NOT DETECTED NOT DETECTED   Carbapenem resistance KPC NOT DETECTED NOT DETECTED   Carbapenem resistance NDM NOT DETECTED NOT DETECTED   Carbapenem resist OXA 48 LIKE NOT DETECTED NOT DETECTED   Carbapenem resistance VIM NOT DETECTED NOT DETECTED    Doreene Eland, PharmD, BCPS.   Work Cell: [phone] 12/18/2020 8:44 AM

## 2020-12-19 DIAGNOSIS — D696 Thrombocytopenia, unspecified: Secondary | ICD-10-CM | POA: Diagnosis not present

## 2020-12-19 DIAGNOSIS — D709 Neutropenia, unspecified: Secondary | ICD-10-CM | POA: Diagnosis not present

## 2020-12-19 DIAGNOSIS — T451X5A Adverse effect of antineoplastic and immunosuppressive drugs, initial encounter: Secondary | ICD-10-CM

## 2020-12-19 DIAGNOSIS — K819 Cholecystitis, unspecified: Secondary | ICD-10-CM | POA: Diagnosis not present

## 2020-12-19 DIAGNOSIS — A414 Sepsis due to anaerobes: Secondary | ICD-10-CM

## 2020-12-19 DIAGNOSIS — A419 Sepsis, unspecified organism: Secondary | ICD-10-CM | POA: Diagnosis not present

## 2020-12-19 DIAGNOSIS — D6481 Anemia due to antineoplastic chemotherapy: Secondary | ICD-10-CM | POA: Diagnosis not present

## 2020-12-19 DIAGNOSIS — C3490 Malignant neoplasm of unspecified part of unspecified bronchus or lung: Secondary | ICD-10-CM

## 2020-12-19 DIAGNOSIS — D701 Agranulocytosis secondary to cancer chemotherapy: Secondary | ICD-10-CM | POA: Diagnosis not present

## 2020-12-19 LAB — CBC WITH DIFFERENTIAL/PLATELET
Abs Immature Granulocytes: 0 10*3/uL (ref 0.00–0.07)
Basophils Absolute: 0 10*3/uL (ref 0.0–0.1)
Basophils Relative: 2 %
Eosinophils Absolute: 0 10*3/uL (ref 0.0–0.5)
Eosinophils Relative: 0 %
HCT: 23.8 % — ABNORMAL LOW (ref 39.0–52.0)
Hemoglobin: 8.9 g/dL — ABNORMAL LOW (ref 13.0–17.0)
Immature Granulocytes: 0 %
Lymphocytes Relative: 38 %
Lymphs Abs: 0.2 10*3/uL — ABNORMAL LOW (ref 0.7–4.0)
MCH: 35.3 pg — ABNORMAL HIGH (ref 26.0–34.0)
MCHC: 37.4 g/dL — ABNORMAL HIGH (ref 30.0–36.0)
MCV: 94.4 fL (ref 80.0–100.0)
Monocytes Absolute: 0.1 10*3/uL (ref 0.1–1.0)
Monocytes Relative: 11 %
Neutro Abs: 0.3 10*3/uL — CL (ref 1.7–7.7)
Neutrophils Relative %: 49 %
Platelets: 9 10*3/uL — CL (ref 150–400)
RBC: 2.52 MIL/uL — ABNORMAL LOW (ref 4.22–5.81)
RDW: 12.5 % (ref 11.5–15.5)
WBC: 0.6 10*3/uL — CL (ref 4.0–10.5)
nRBC: 0 % (ref 0.0–0.2)

## 2020-12-19 LAB — BASIC METABOLIC PANEL
Anion gap: 6 (ref 5–15)
BUN: 43 mg/dL — ABNORMAL HIGH (ref 8–23)
CO2: 22 mmol/L (ref 22–32)
Calcium: 7 mg/dL — ABNORMAL LOW (ref 8.9–10.3)
Chloride: 110 mmol/L (ref 98–111)
Creatinine, Ser: 0.94 mg/dL (ref 0.61–1.24)
GFR, Estimated: >60 mL/min (ref >60)
Glucose, Bld: 118 mg/dL — ABNORMAL HIGH (ref 70–99)
Potassium: 3.3 mmol/L — ABNORMAL LOW (ref 3.5–5.1)
Sodium: 138 mmol/L (ref 135–145)

## 2020-12-19 LAB — MAGNESIUM: Magnesium: 1.6 mg/dL — ABNORMAL LOW (ref 1.7–2.4)

## 2020-12-19 LAB — URINE CULTURE: Culture: NO GROWTH

## 2020-12-19 LAB — HIV ANTIBODY (ROUTINE TESTING W REFLEX): HIV Screen 4th Generation wRfx: NONREACTIVE

## 2020-12-19 LAB — LACTIC ACID, PLASMA: Lactic Acid, Venous: 1.9 mmol/L (ref 0.5–1.9)

## 2020-12-19 MED ORDER — LACTULOSE 10 GM/15ML PO SOLN
20.0000 g | Freq: Once | ORAL | Status: AC
  Administered 2020-12-19: 20 g via ORAL
  Filled 2020-12-19: qty 30

## 2020-12-19 MED ORDER — DEXTROSE 50 % IV SOLN
INTRAVENOUS | Status: AC
  Filled 2020-12-19: qty 50

## 2020-12-19 MED ORDER — SODIUM CHLORIDE FLUSH 0.9 % IV SOLN
INTRAVENOUS | Status: AC
  Filled 2020-12-19: qty 10

## 2020-12-19 MED ORDER — DRONABINOL 2.5 MG PO CAPS
2.5000 mg | ORAL_CAPSULE | Freq: Two times a day (BID) | ORAL | Status: DC
  Administered 2020-12-19 – 2020-12-24 (×9): 2.5 mg via ORAL
  Filled 2020-12-19 (×9): qty 1

## 2020-12-19 MED ORDER — DILTIAZEM HCL 25 MG/5ML IV SOLN
5.0000 mg | INTRAVENOUS | Status: AC
  Administered 2020-12-18 – 2020-12-19 (×2): 5 mg via INTRAVENOUS

## 2020-12-19 MED ORDER — CHLORHEXIDINE GLUCONATE CLOTH 2 % EX PADS
6.0000 | MEDICATED_PAD | Freq: Every day | CUTANEOUS | Status: DC
  Administered 2020-12-19 – 2020-12-20 (×2): 6 via TOPICAL

## 2020-12-19 MED ORDER — SODIUM CHLORIDE 0.9% IV SOLUTION: Freq: Once | INTRAVENOUS | Status: DC

## 2020-12-19 MED ORDER — POTASSIUM CHLORIDE 10 MEQ/100ML IV SOLN
10.0000 meq | INTRAVENOUS | Status: AC
  Administered 2020-12-19 (×4): 10 meq via INTRAVENOUS
  Filled 2020-12-19 (×4): qty 100

## 2020-12-19 MED ORDER — MUPIROCIN 2 % EX OINT
1.0000 "application " | TOPICAL_OINTMENT | Freq: Two times a day (BID) | CUTANEOUS | Status: AC
  Administered 2020-12-19 – 2020-12-23 (×8): 1 via NASAL
  Filled 2020-12-19: qty 22

## 2020-12-19 MED ORDER — MAGNESIUM SULFATE 2 GM/50ML IV SOLN
2.0000 g | Freq: Once | INTRAVENOUS | Status: AC
  Administered 2020-12-19: 2 g via INTRAVENOUS
  Filled 2020-12-19: qty 50

## 2020-12-19 NOTE — Consult Note (Signed)
Brief Interventional Radiology Consult Note  Contacted by Dr. Lysle Pearl of general surgery to evaluate this gentleman for possible percutaneous cholecystostomy tube placement.  Klebsiella bacteremia with imaging evidence of calculous cholecystitis complicated by severe pancytopenia due to chemotherapy for small cell lung cancer.  Thrombocytopenia is severe with platelets of 9,000.    Typically, platelets less than 50k is considered a relative contraindication to transhepatic cholecystostomy tube placement but can be considered in the setting of life threatening infection.  Fortunately, Scott Jordan has been improving with conservative therapy.  His lactic acid is normal today and WBC starting to increase.    I advocate for continued conservative management with IV abx and supportive care.  If he continues to improve, cholecystostomy might be avoided.  However, if no improvement or worsening tomorrow, we may have to reconsider intervention.    Will follow.    Thank you for the chance to assist in the care of this gentleman.   Signed,  Criselda Peaches, MD

## 2020-12-19 NOTE — Consult Note (Signed)
Subjective:   CC: acute cholecystitis  HPI:  Scott Jordan is a 70 y.o. male who is consulted by [facility] for evaluation of above cc.  Symptoms were first noted 1 week ago. Pain is sharp, epigastric.  Associated with hematemesis, weakness, exacerbated by nothing specific, alleviated by nothing specific.  Workup noted sepsis, with klebsiella in blood, RUQ [place] concerning for cholecsytitis, no other obvious source of sepsis.     Past Medical History:  has a past medical history of A-fib ([place]) (01/10/2019), Cancer ([place]) (9937), Complication of anesthesia, Hypertension, Lung cancer ([place]), and Substance abuse ([place]).  Past Surgical History:  has a past surgical history that includes Back surgery; Arm Debridement (Left); and Portacath placement (Left, 08/29/2019).  Family History: family history is not on file.  Social History:  reports that he has quit smoking. His smoking use included cigarettes. He smoked an average of .5 packs per day. He has never used smokeless tobacco. He reports current alcohol use. He reports that he does not currently use drugs after having used the following drugs: Heroin.  Current Medications:  Prior to Admission medications   Medication Sig Start Date End Date Taking? Authorizing Provider  diltiazem (TIAZAC) 240 MG 24 hr capsule Take 1 capsule by mouth daily. 07/24/19  Yes [provider]  ELIQUIS 5 MG TABS tablet Take 5 mg by mouth 2 (two) times daily. 08/30/19  Yes [provider]  magnesium oxide (MAG-OX) 400 MG tablet Take by mouth.   Yes [provider]  potassium chloride SA (KLOR-CON) 20 MEQ tablet TAKE 1 TABLET BY MOUTH DAILY FOR 3 DAYS 12/03/19  Yes Lequita Asal, MD  prochlorperazine (COMPAZINE) 10 MG tablet Take 1 tablet (10 mg total) by mouth every 6 (six) hours as needed for nausea or vomiting. 12/08/20  Yes Earlie Server, MD  feeding supplement, ENSURE ENLIVE, (ENSURE ENLIVE) LIQD Take 237 mLs by mouth 3 (three) times daily between meals.  08/23/19   Enzo Bi, MD  lidocaine-prilocaine (EMLA) cream Apply to affected area once Patient not taking: No sig reported 08/26/19   Lequita Asal, MD  MAVYRET 100-40 MG TABS Take 3 tablets by mouth daily. Patient not taking: No sig reported 08/18/20   [provider]  mirtazapine (REMERON) 15 MG tablet Take by mouth. Patient not taking: No sig reported 06/26/20 06/26/21  [provider]  naloxone [facility]) nasal spray 4 mg/0.1 mL once as needed Patient not taking: No sig reported 01/13/19   [provider]  ondansetron (ZOFRAN) 8 MG tablet Take 1 tablet (8 mg total) by mouth every 8 (eight) hours as needed for refractory nausea / vomiting. Start on day 3 after carboplatin chemo. Patient not taking: No sig reported 08/26/19   Lequita Asal, MD    Allergies:  Allergies as of 12/17/2020   (No Known Allergies)    ROS:  General: Denies weight loss, weight gain, fatigue, fevers, chills, and night sweats. Eyes: Denies blurry vision, double vision, eye pain, itchy eyes, and tearing. Ears: Denies hearing loss, earache, and ringing in ears. Nose: Denies sinus pain, congestion, infections, runny nose, and nosebleeds. Mouth/throat: Denies hoarseness, sore throat, bleeding gums, and difficulty swallowing. Heart: Denies chest pain, palpitations, racing heart, irregular heartbeat, leg pain or swelling, and decreased activity tolerance. Respiratory: Denies breathing difficulty, shortness of breath, wheezing, cough, and sputum. GI: Denies change in appetite, heartburn, nausea, vomiting, constipation, diarrhea, and blood in stool. GU: Denies difficulty urinating, pain with urinating, urgency, frequency, blood in urine.  Musculoskeletal: Denies joint stiffness, pain, swelling, muscle weakness. Skin: Denies rash, itching, mass, tumors, sores, and boils Neurologic: Denies headache, fainting, dizziness, seizures, numbness, and tingling. Psychiatric: Denies depression,  anxiety, difficulty sleeping, and memory loss. Endocrine: Denies heat or cold intolerance, and increased thirst or urination. Blood/lymph: Denies easy bruising, and swollen glands     Objective:     BP 97/72   Pulse 78   Temp 98.5 F (36.9 C) (Oral)   Resp (!) 22   Ht 6' (1.829 m)   Wt 62.2 kg   SpO2 100%   BMI 18.60 kg/m    Constitutional :  alert, cooperative, and appears stated age  Lymphatics/Throat:  no asymmetry, masses, or scars  Respiratory:  clear to auscultation bilaterally  Cardiovascular:  regular rate and rhythm  Gastrointestinal: Soft, mild TTP in epigastric region .   Musculoskeletal: Steady movement  Skin: Cool and moist, no surgical scars  Psychiatric: Normal affect, non-agitated, not confused       LABS:  CMP Latest Ref Rng & Units 12/19/2020 12/18/2020 12/17/2020  Glucose 70 - 99 mg/dL 118(H) 166(H) 209(H)  BUN 8 - 23 mg/dL 43(H) 48(H) 31(H)  Creatinine 0.61 - 1.24 mg/dL 0.94 1.46(H) 1.45(H)  Sodium 135 - 145 mmol/L 138 135 136  Potassium 3.5 - 5.1 mmol/L 3.3(L) 3.0(L) 3.3(L)  Chloride 98 - 111 mmol/L 110 101 101  CO2 22 - 32 mmol/L 22 21(L) 22  Calcium 8.9 - 10.3 mg/dL 7.0(L) 7.4(L) 8.0(L)  Total Protein 6.5 - 8.1 g/dL - - 6.3(L)  Total Bilirubin 0.3 - 1.2 mg/dL - - 1.8(H)  Alkaline Phos 38 - 126 U/L - - 50  AST 15 - 41 U/L - - 51(H)  ALT 0 - 44 U/L - - 32   CBC Latest Ref Rng & Units 12/19/2020 12/18/2020 12/18/2020  WBC 4.0 - 10.5 K/uL 0.6(LL) 0.4(LL) -  Hemoglobin 13.0 - 17.0 g/dL 8.9(L) 9.2(L) -  Hematocrit 39.0 - 52.0 % 23.8(L) 25.6(L) -  Platelets 150 - 400 K/uL 9(LL) 16(LL) 28(LL)     RADS: CLINICAL DATA:  Evaluate for cholecystitis.   EXAM: ULTRASOUND ABDOMEN LIMITED RIGHT UPPER QUADRANT   COMPARISON:  CT AP from 12/17/2020   FINDINGS: Gallbladder:   The gallbladder wall is diffusely edematous measuring up to 13.1 mm. Gallstones are noted measuring up to 5.9 mm. Pericholecystic fluid. Positive sonographic Murphy's sign.   Common  bile duct:   Diameter: 6 mm   Liver:   Diffusely increased parenchymal echogenicity identified. No focal liver abnormality. Portal vein is patent on color Doppler imaging with normal direction of blood flow towards the liver.   Other: 1.9 cm right kidney cyst.   IMPRESSION: Gallstones, gallbladder wall thickening and pericholecystic fluid with positive sonographic Murphy's sign. Findings are compatible with acute cholecystitis.     Electronically Signed   By: Kerby Moors M.D.   On: 12/18/2020 14:18   Assessment:      Acute cholecystitis Hx of lung CA Neutropenia thrombocytopenia  Plan:    Pt plts too low to undergo cholecystectomy safely.  Recommend IR guided chole tube.  Discussed case with IR, and they are concerned about his plt count as well.  Stated he will discuss with oncologist regarding plt status and prognosis of any improvement in overall numbers.  Recommended continuing full supportive care in the meantime.  If procedure is to be scheduled, medical POA Scott Jordan will sign consent and asked to be contacted.  Surgery will peripherally follow for now.  Please call with any questions or concerns.

## 2020-12-19 NOTE — Progress Notes (Signed)
Hematology/Oncology Progress Note [facility] [phone] [phone]  Patient Care Team: Earlie Server, MD as PCP - General (Oncology) Telford Nab, RN as Registered Nurse Erby Pian, MD as Referring Physician (Specialist) Teodoro Spray, MD as Consulting Physician (Cardiology) Anthonette Legato, MD as Consulting Physician (Nephrology) Vladimir Crofts, MD as Consulting Physician (Neurology) Meade Maw, MD as Consulting Physician (Neurosurgery)   Name of the patient: Scott Jordan  [id number]  19-Jul-1950  Date of visit: 12/19/20   INTERVAL HISTORY-  Patient is currently admitted in ICU.  Still feel tired and fatigue. Afebrile.  Patient denies abdominal pain currently. 12/18/2020, ultrasound abdomen right upper quadrant showed gallbladder wall thickening and pericholecystic fluid positive for sonographic Murphy sign.  Compatible with acute cholecystitis. Urine culture-no growth   Review of systems- Review of Systems  Constitutional:  Positive for appetite change, fatigue and unexpected weight change. Negative for chills and fever.  HENT:   Negative for hearing loss and voice change.   Eyes:  Negative for eye problems and icterus.  Respiratory:  Negative for chest tightness, cough and shortness of breath.   Cardiovascular:  Negative for chest pain and leg swelling.  Gastrointestinal:  Negative for abdominal distention, abdominal pain and blood in stool.  Endocrine: Negative for hot flashes.  Genitourinary:  Negative for difficulty urinating, dysuria and frequency.   Musculoskeletal:  Negative for arthralgias.  Skin:  Negative for itching and rash.  Neurological:  Negative for light-headedness and numbness.  Hematological:  Negative for adenopathy. Does not bruise/bleed easily.  Psychiatric/Behavioral:  Negative for confusion.    No Known Allergies  Patient Active Problem List   Diagnosis Date Noted   Septicemia due to Klebsiella  pneumoniae ([place]) 12/18/2020   Neutropenic sepsis ([place]) 12/17/2020   Head trauma 06/24/2020   Ulnar neuropathy of left upper extremity 05/13/2020   Cervical radiculopathy 05/13/2020   B12 deficiency 04/06/2020   Weight loss 03/23/2020   Neuropathy of left upper extremity 03/22/2020   Hepatitis B infection without delta agent without hepatic coma 12/30/2019   Chronic hepatitis C without hepatic coma ([place]) 12/30/2019   Chronic anticoagulation 12/23/2019   Elevated LFTs 12/23/2019   Hypokalemia 12/08/2019   Acute respiratory failure with hypoxia ([place]) 11/23/2019   Heroin overdose, accidental or unintentional, initial encounter ([place]) 11/23/2019   Aspiration pneumonia ([place]) 11/23/2019   Pancytopenia ([place]) 11/23/2019   Atypical atrial flutter ([place]) 11/23/2019   Hypomagnesemia 10/21/2019   Antineoplastic chemotherapy induced anemia 10/13/2019   Dehydration 10/13/2019   Thrombocytopenia ([place]) 10/13/2019   Chemotherapy induced neutropenia ([place]) 10/10/2019   Anemia 09/11/2019   Cancer-related pain 09/05/2019   Encounter for antineoplastic chemotherapy 09/02/2019   Encounter for antineoplastic immunotherapy 09/02/2019   Bone metastasis ([place]) 09/02/2019   Goals of care, counseling/discussion 08/26/2019   Small cell lung cancer ([place]) 08/23/2019   Protein-calorie malnutrition, severe 08/19/2019   Hyponatremia    Dyspnea    Pleural effusion    Recurrent pleural effusion on left    Malignant pleural effusion 08/17/2019   Alcohol abuse 07/21/2019   History of substance abuse ([place]) 06/18/2019   Hypertension, essential 06/18/2019   Malnutrition of moderate degree 01/15/2019   Elevated troponin 01/14/2019     Past Medical History:  Diagnosis Date   A-fib ([place]) 01/10/2019   pt st this was dx by Dr. Ubaldo Glassing   Cancer [facility] [id number]   LUNG   Complication of anesthesia    DIFFICULTY WAKING UP AFTER SURGERY- 20 YRS AGO  Hypertension    Lung cancer ([place])    Substance abuse ([place])      Past  Surgical History:  Procedure Laterality Date   ARM DEBRIDEMENT Left    INCISION AND DEBRIDEMENT LOWER ARM -20 YRS AGO   BACK SURGERY     PORTACATH PLACEMENT Left 08/29/2019   Procedure: INSERTION PORT-A-CATH;  Surgeon: Nestor Lewandowsky, MD;  Location: [facility] ORS;  Service: General;  Laterality: Left;    Social History   Socioeconomic History   Marital status: Single    Spouse name: Not on file   Number of children: Not on file   Years of education: Not on file   Highest education level: Not on file  Occupational History   Not on file  Tobacco Use   Smoking status: Former    Packs/day: 0.50    Types: Cigarettes   Smokeless tobacco: Never   Tobacco comments:    not smoked since 6 weeks ago  Vaping Use   Vaping Use: Never used  Substance and Sexual Activity   Alcohol use: Yes    Comment: 38 OZ IN WEEK   Drug use: Not Currently    Types: Heroin    Comment: heroin WITHIN PAST YEAR   Sexual activity: Not on file  Other Topics Concern   Not on file  Social History Narrative   Not on file   Social Determinants of Health   Financial Resource Strain: Not on file  Food Insecurity: Not on file  Transportation Needs: Not on file  Physical Activity: Not on file  Stress: Not on file  Social Connections: Not on file  Intimate Partner Violence: Not on file     History reviewed. No pertinent family history.   Current Facility-Administered Medications:    0.9 %  sodium chloride infusion (Manually program via Guardrails IV Fluids), , Intravenous, Once, Sharen Hones, MD   acetaminophen (TYLENOL) tablet 650 mg, 650 mg, Oral, Q6H PRN **OR** acetaminophen (TYLENOL) suppository 650 mg, 650 mg, Rectal, Q6H PRN, Cox, Amy N, DO   cefTRIAXone (ROCEPHIN) 2 g in sodium chloride 0.9 % 100 mL IVPB, 2 g, Intravenous, Q24H, Sharen Hones, MD, Stopped at 12/19/20 0542   chlorhexidine (PERIDEX) 0.12 % solution 10 mL, 10 mL, Mouth/Throat, BID, Earlie Server, MD, 10 mL at 12/19/20 3149   Chlorhexidine  Gluconate Cloth 2 % PADS 6 each, 6 each, Topical, Daily, Cox, Amy N, DO   Chlorhexidine Gluconate Cloth 2 % PADS 6 each, 6 each, Topical, Q0600, Cox, Amy N, DO, 6 each at 12/19/20 1230   dextrose 50 % solution, , , ,    diltiazem (CARDIZEM CD) 24 hr capsule 240 mg, 240 mg, Oral, Daily, Cox, Amy N, DO, 240 mg at 12/19/20 7026   diltiazem (CARDIZEM) injection 10 mg, 10 mg, Intravenous, Q2H PRN, Cox, Amy N, DO   dronabinol (MARINOL) capsule 2.5 mg, 2.5 mg, Oral, BID AC, Sharen Hones, MD   feeding supplement (ENSURE ENLIVE / ENSURE PLUS) liquid 237 mL, 237 mL, Oral, TID BM, Cox, Amy N, DO, 237 mL at 37/85/88 5027   folic acid (FOLVITE) tablet 1 mg, 1 mg, Oral, Daily, Sharen Hones, MD, 1 mg at 12/19/20 0928   lactulose (CHRONULAC) 10 GM/15ML solution 20 g, 20 g, Oral, Once, Sharen Hones, MD   mupirocin ointment (BACTROBAN) 2 % 1 application, 1 application, Nasal, BID, Cox, Amy N, DO   ondansetron (ZOFRAN) tablet 4 mg, 4 mg, Oral, Q6H PRN **OR** ondansetron (ZOFRAN) injection 4 mg, 4 mg, Intravenous,  Q6H PRN, Cox, Amy N, DO   oxyCODONE-acetaminophen (PERCOCET/ROXICET) 5-325 MG per tablet 1 tablet, 1 tablet, Oral, Q6H PRN, Mansy, Jan A, MD, 1 tablet at 12/18/20 2319   sodium chloride flush 0.9 % injection, , , ,    thiamine tablet 100 mg, 100 mg, Oral, Daily, Sharen Hones, MD, 100 mg at 12/19/20 6789  Facility-Administered Medications Ordered in Other Encounters:    heparin lock flush 100 unit/mL, 500 Units, Intravenous, Once, Corcoran, Melissa C, MD   sodium chloride flush (NS) 0.9 % injection 10 mL, 10 mL, Intravenous, PRN, Nolon Stalls C, MD, 10 mL at 11/11/19 3810   Physical exam:  Vitals:   12/19/20 1200 12/19/20 1300 12/19/20 1400 12/19/20 1500  BP: (!) 118/91 112/72 (!) 114/92 97/72  Pulse:      Resp: (!) 27 (!) 26 (!) 30 (!) 22  Temp:   98.5 F (36.9 C)   TempSrc:   Oral   SpO2:      Weight:      Height:       Physical Exam Constitutional:      General: He is not in acute  distress.    Appearance: He is not diaphoretic.  HENT:     Head: Normocephalic and atraumatic.     Nose: Nose normal.     Mouth/Throat:     Pharynx: No oropharyngeal exudate.  Eyes:     General: No scleral icterus.    Pupils: Pupils are equal, round, and reactive to light.  Cardiovascular:     Rate and Rhythm: Normal rate and regular rhythm.     Heart sounds: No murmur heard. Pulmonary:     Effort: Pulmonary effort is normal. No respiratory distress.     Breath sounds: No rales.  Chest:     Chest wall: No tenderness.  Abdominal:     General: There is no distension.     Palpations: Abdomen is soft.     Tenderness: There is no abdominal tenderness.  Musculoskeletal:        General: Normal range of motion.     Cervical back: Normal range of motion and neck supple.  Skin:    General: Skin is warm and dry.     Findings: No erythema.  Neurological:     Mental Status: He is alert and oriented to person, place, and time.     Cranial Nerves: No cranial nerve deficit.     Motor: No abnormal muscle tone.     Coordination: Coordination normal.  Psychiatric:        Mood and Affect: Affect normal.       CMP Latest Ref Rng & Units 12/19/2020  Glucose 70 - 99 mg/dL 118(H)  BUN 8 - 23 mg/dL 43(H)  Creatinine 0.61 - 1.24 mg/dL 0.94  Sodium 135 - 145 mmol/L 138  Potassium 3.5 - 5.1 mmol/L 3.3(L)  Chloride 98 - 111 mmol/L 110  CO2 22 - 32 mmol/L 22  Calcium 8.9 - 10.3 mg/dL 7.0(L)  Total Protein 6.5 - 8.1 g/dL -  Total Bilirubin 0.3 - 1.2 mg/dL -  Alkaline Phos 38 - 126 U/L -  AST 15 - 41 U/L -  ALT 0 - 44 U/L -   CBC Latest Ref Rng & Units 12/19/2020  WBC 4.0 - 10.5 K/uL 0.6(LL)  Hemoglobin 13.0 - 17.0 g/dL 8.9(L)  Hematocrit 39.0 - 52.0 % 23.8(L)  Platelets 150 - 400 K/uL 9(LL)    RADIOGRAPHIC STUDIES: I have personally reviewed the radiological images  as listed and agreed with the findings in the report. CT ABDOMEN PELVIS WO CONTRAST  Result Date: 12/17/2020 CLINICAL  DATA:  Nausea and vomiting EXAM: CT ABDOMEN AND PELVIS WITHOUT CONTRAST TECHNIQUE: Multidetector CT imaging of the abdomen and pelvis was performed following the standard protocol without IV contrast. COMPARISON:  CT 06/23/2020, PET CT 11/19/2020 FINDINGS: Lower chest: Lung bases demonstrate emphysema. Subpleural bandlike airspace disease at the left lower lobe redemonstrated. Incompletely visualized subpleural peripheral left upper lobe nodule measuring 11 mm, series 5, image 1. Cardiomegaly with trace pericardial effusion. Coronary vascular calcification. Hepatobiliary: Extensive motion degradation. No focal hepatic abnormality. Gallstones. Gallbladder slightly distended. Possible wall thickening or pericholecystic fluid though limited by motion Pancreas: Unremarkable. No pancreatic ductal dilatation or surrounding inflammatory changes. Spleen: Normal in size without focal abnormality. Adrenals/Urinary Tract: Thickened appearance of adrenal glands. No hydronephrosis. The bladder is unremarkable Stomach/Bowel: Stomach nonenlarged. No dilated small bowel. Negative appendix. No acute bowel wall thickening Vascular/Lymphatic: Advanced aortic atherosclerosis. No aneurysm. Slightly increased multiple small retroperitoneal lymph nodes. Reproductive: Prostate is unremarkable. Other: Negative for free air or free fluid Musculoskeletal: No acute osseous abnormality. IMPRESSION: 1. Degraded by motion. 2. Gallbladder appears slightly distended and there are gallstones. Possible gallbladder wall thickening or pericholecystic fluid though limited by motion, consider correlation with ultrasound 3. Cardiomegaly with trace pericardial effusion. 4. Similar subpleural bandlike density at left lower lobe. Incompletely visualized peripheral left upper lobe lung nodule characterized on recent PET CT Electronically Signed   By: Donavan Foil M.D.   On: 12/17/2020 16:50   CT HEAD WO CONTRAST (5MM)  Result Date: 12/17/2020 CLINICAL  DATA:  Nausea vomiting EXAM: CT HEAD WITHOUT CONTRAST TECHNIQUE: Contiguous axial images were obtained from the base of the skull through the vertex without intravenous contrast. COMPARISON:  CT brain 06/24/2020, MRI 03/10/2020, head CT 11/22/2019 FINDINGS: Brain: No acute territorial infarction, hemorrhage or intracranial mass. Extensive white matter disease, progressed from more remote exams from 2021. Stable ventricle size. Chronic lacunar infarcts within the left white matter. Vascular: No hyperdense vessel.  Carotid vascular calcification Skull: Normal. Negative for fracture or focal lesion. Sinuses/Orbits: Mucosal thickening the sinuses. Chronic appearing nasal deformity Other: None IMPRESSION: 1. No definite CT evidence for acute intracranial abnormality. 2. Atrophy. Extensive white matter disease, grossly stable as compared with recent head CT from March, appears progressive when compared to exams from 2021 and prior Electronically Signed   By: Donavan Foil M.D.   On: 12/17/2020 16:39   DG Chest Port 1 View  Result Date: 12/17/2020 CLINICAL DATA:  Questionable sepsis.  Weakness EXAM: PORTABLE CHEST 1 VIEW COMPARISON:  03/09/2020 FINDINGS: Left subclavian approached chest port remains in place. Stable cardiomegaly. Chronic left basilar scarring or atelectasis. Peripheral left upper lobe nodule better seen on prior CT. No new airspace consolidation. Pleural effusion or pneumothorax. IMPRESSION: Chronic left basilar scarring or atelectasis. No new or acute findings. Electronically Signed   By: Davina Poke D.O.   On: 12/17/2020 13:20   US Abdomen Limited RUQ (LIVER/GB)  Result Date: 12/18/2020 CLINICAL DATA:  Evaluate for cholecystitis. EXAM: ULTRASOUND ABDOMEN LIMITED RIGHT UPPER QUADRANT COMPARISON:  CT AP from 12/17/2020 FINDINGS: Gallbladder: The gallbladder wall is diffusely edematous measuring up to 13.1 mm. Gallstones are noted measuring up to 5.9 mm. Pericholecystic fluid. Positive  sonographic Murphy's sign. Common bile duct: Diameter: 6 mm Liver: Diffusely increased parenchymal echogenicity identified. No focal liver abnormality. Portal vein is patent on color Doppler imaging with normal direction of blood flow towards  the liver. Other: 1.9 cm right kidney cyst. IMPRESSION: Gallstones, gallbladder wall thickening and pericholecystic fluid with positive sonographic Murphy's sign. Findings are compatible with acute cholecystitis. Electronically Signed   By: Kerby Moors M.D.   On: 12/18/2020 14:18    Assessment and plan-   #Neutropenic sepsis secondary to Klebsiella pneumonia bacteremia. Ultrasound findings is compatible with acute cholecystitis.  Likely the source of infection. Urine culture-no growth. Discussed with Dr.Zhang.consult surgery.  Appreciated surgery recommendation.  #Chemotherapy-induced pancytopenia Neutropenia is improving.  ANC 0.3 today.-Neulasta on 12/11/2020 Hemoglobin 8.9, slightly decreasing. Thrombocytopenia, platelet count 90,000.  Recommend 1 unit of irradiated platelet transfusion, keep count above 10,000.  Monitor CBC daily. Peridex swish and spit twice daily.  #AKI has improved.  Creatinine has improved 2.9. #Chronic paroxysmal atrial fibrillation, off anticoagulation due to thrombocytopenia. Thank you for allowing me to participate in the care of this patient.   Earlie Server, MD, PhD Hematology Oncology [place] at [facility]  12/19/2020

## 2020-12-19 NOTE — Progress Notes (Addendum)
PROGRESS NOTE    Scott Jordan  DXA:[id number] DOB: [birth year] DOA: 12/17/2020 PCP: Earlie Server, MD   Chief complaint.  Nausea vomiting Brief Narrative:  Scott Jordan is a 70 y.o. 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 Upon arriving the hospital, patient was found to be septic with elevated respirate, tachycardia and severe neutropenia.  She was placed on broad-spectrum antibiotics.  Blood culture came back positive for Klebsiella, antibiotic switched to Rocephin.   Assessment & Plan:   Principal Problem:   Neutropenic sepsis ([place]) Active Problems:   Protein-calorie malnutrition, severe   Small cell lung cancer ([place])   Bone metastasis ([place])   Chemotherapy induced neutropenia ([facility])   Antineoplastic chemotherapy induced anemia   Thrombocytopenia ([facility])   Atypical atrial flutter ([facility])   Chronic anticoagulation   Alcohol abuse   History of substance abuse ([place])   Hypertension, essential   B12 deficiency   Septicemia due to Klebsiella pneumoniae ([place])  #1.  Neutropenic sepsis. Septicemia secondary to Klebsiella pneumoniae. Probable Klebsiella pneumonia. Patient is more hemodynamically stable, will transfer to regular medical floor. Continue Rocephin.  Final culture result still pending.  #2.  Small cell lung cancer. Pancytopenia related to chemotherapy. Followed by oncology.  3.  Paroxysmal atrial fibrillation. Currently heart rate regular rate and controlled.  Not a candidate for anticoagulation.  4.  Severe protein calorie malnutrition. Patient still has a poor appetite, add  Marinol.  Continues protein supplements.  5.  Acute kidney injury  Hypokalemia Hyponatremia. Condition had improved.  Will discontinue fluids.  6.  Alcohol use disorder. No evidence of withdrawal.   14:36 RUQ U/S positive for cholecystitis. Blood culture positive for klebsiella, the source of septicemia is from cholecystitis. Will continue Rocephin and consult surgery.   1500. Discussed with Dr. Lysle Pearl, will order 2 units of Platelets for cholecystomy.   DVT prophylaxis: SCDs Code Status: full Family Communication:  Disposition Plan:    Status is: Inpatient  Remains inpatient appropriate because:IV treatments appropriate due to intensity of illness or inability to take PO and Inpatient level of care appropriate due to severity of illness  Dispo: The patient is from: Home              Anticipated d/c is to: Home              Patient currently is not medically stable to d/c.   Difficult to place patient No        I/O last 3 completed shifts: In: 2689 [P.O.:640; I.V.:1460.4; Blood:300; IV Piggyback:288.6] Out: 995 [Urine:995] Total I/O In: 170 [P.O.:120; IV Piggyback:50] Out: -      Consultants:  Oncology  Procedures: None  Antimicrobials: Rocephin  Subjective: Patient feels better today, still has severe fatigue.  Poor appetite, but no nausea vomiting.  He has not had a bowel movement since admission. No fever or chills. No dysuria hematuria No headache or dizziness. No chest pain palpitation.  Objective: Vitals:   12/19/20 0746 12/19/20 0800 12/19/20 0900 12/19/20 1000  BP:   105/77 118/80  Pulse:  (!) 41  78  Resp: (!) 21 (!) 24 (!) 25 (!) 26  Temp: 98.5 F (36.9 C)     TempSrc:  Oral     SpO2: 100% (!) 87%  100%  Weight:      Height:        Intake/Output Summary (Last 24 hours) at 12/19/2020 1300 Last data filed at 12/19/2020 1203 Gross per 24 hour  Intake 2258.98 ml  Output 995 ml  Net 1263.98 ml   Filed Weights   12/17/20 1203 12/18/20  2130  Weight: 61.7 kg 62.2 kg    Examination:  General exam: Appears calm and comfortable  Respiratory system: Clear to auscultation. Respiratory effort normal. Cardiovascular system: S1 & S2 heard, RRR. No JVD, murmurs, rubs, gallops or clicks. No pedal edema. Gastrointestinal system: Abdomen is nondistended, soft and nontender. No organomegaly or masses felt. Normal bowel sounds heard. Central nervous system: Alert and oriented x3. No focal neurological deficits. Extremities: Symmetric 5 x 5 power. Skin: No rashes, lesions or ulcers Psychiatry: Mood & affect appropriate.     Data Reviewed: I have personally reviewed following labs and imaging studies  CBC: Recent Labs  Lab 12/17/20 1205 12/17/20 2000 12/18/20 0254 12/18/20 0857 12/19/20 0457  WBC 0.2* 0.4*  --  0.4* 0.6*  NEUTROABS 0.0*  --   --  0.1* 0.3*  HGB 10.1* 9.8*  --  9.2* 8.9*  HCT 27.0* 26.1*  --  25.6* 23.8*  MCV 95.4 96.3  --  94.1 94.4  PLT 7* 8* 28* 16* 9*   Basic Metabolic Panel: Recent Labs  Lab 12/17/20 1205 12/18/20 0500 12/19/20 0457  NA 136 135 138  K 3.3* 3.0* 3.3*  CL 101 101 110  CO2 22 21* 22  GLUCOSE 209* 166* 118*  BUN 31* 48* 43*  CREATININE 1.45* 1.46* 0.94  CALCIUM 8.0* 7.4* 7.0*  MG  --   --  1.6*   GFR: Estimated Creatinine Clearance: 65.3 mL/min (by C-G formula based on SCr of 0.94 mg/dL). Liver Function Tests: Recent Labs  Lab 12/17/20 1205  AST 51*  ALT 32  ALKPHOS 50  BILITOT 1.8*  PROT 6.3*  ALBUMIN 3.1*   No results for input(s): LIPASE, AMYLASE in the last 168 hours. No results for input(s): AMMONIA in the last 168 hours. Coagulation Profile: Recent Labs  Lab 12/17/20 1205 12/18/20 0500  INR 1.5* 1.6*   Cardiac Enzymes: No results for input(s): CKTOTAL, CKMB, CKMBINDEX, TROPONINI in the last 168 hours. BNP (last 3 results) No results for input(s): PROBNP in the last 8760 hours. HbA1C: No results for input(s): HGBA1C in the last 72 hours. CBG: No  results for input(s): GLUCAP in the last 168 hours. Lipid Profile: No results for input(s): CHOL, HDL, LDLCALC, TRIG, CHOLHDL, LDLDIRECT in the last 72 hours. Thyroid Function Tests: No results for input(s): TSH, T4TOTAL, FREET4, T3FREE, THYROIDAB in the last 72 hours. Anemia Panel: No results for input(s): VITAMINB12, FOLATE, FERRITIN, TIBC, IRON, RETICCTPCT in the last 72 hours. Sepsis Labs: Recent Labs  Lab 12/17/20 1205 12/17/20 1532 12/17/20 1926 12/18/20 0500 12/19/20 0953  PROCALCITON  --   --   --  24.39  --   LATICACIDVEN 4.0* 5.4* 6.1*  --  1.9    Recent Results (from the past 240 hour(s))  Blood Culture (routine x 2)     Status: Abnormal (Preliminary result)   Collection Time: 12/17/20 12:05 PM   Specimen: BLOOD  Result Value Ref Range Status   Specimen Description   Final    BLOOD LEFT CHEST PORT Performed at [facility], [address], [place], [place] [address]  Special Requests   Final    BOTTLES DRAWN AEROBIC AND ANAEROBIC Blood Culture adequate volume Performed at [facility], [address] [place], [place] [address]    Culture  Setup Time   Final    GRAM NEGATIVE RODS IN BOTH AEROBIC AND ANAEROBIC BOTTLES CRITICAL VALUE NOTED.  VALUE IS CONSISTENT WITH PREVIOUSLY REPORTED AND CALLED VALUE. Performed at [facility], [address], [place], [place] [address]    Culture (A)  Final    KLEBSIELLA PNEUMONIAE CULTURE REINCUBATED FOR BETTER GROWTH Performed at [facility], [place] [address] [place], [place] [address]    Report Status PENDING  Incomplete  Blood Culture (routine x 2)     Status: Abnormal (Preliminary result)   Collection Time: 12/17/20 12:05 PM   Specimen: BLOOD  Result Value Ref Range Status   Specimen Description   Final    BLOOD LEFT CHEST Performed at [facility], [address], [place], [place] [address]    Special Requests   Final    BOTTLES DRAWN AEROBIC AND ANAEROBIC Blood  Culture adequate volume Performed at [facility], [address], [place], [place] [address]    Culture  Setup Time   Final    GRAM NEGATIVE RODS IN BOTH AEROBIC AND ANAEROBIC BOTTLES Organism ID to follow CRITICAL RESULT CALLED TO, READ BACK BY AND VERIFIED WITH: [place] [phone] 12/18/20 HNM Performed at [facility], [address], [place], [place] [address]    Culture (A)  Final    KLEBSIELLA PNEUMONIAE CULTURE REINCUBATED FOR BETTER GROWTH Performed at [facility], [place] [address], [place], [place] [address]    Report Status PENDING  Incomplete  Blood Culture ID Panel (Reflexed)     Status: Abnormal   Collection Time: 12/17/20 12:05 PM  Result Value Ref Range Status   Enterococcus faecalis NOT DETECTED NOT DETECTED Final   Enterococcus Faecium NOT DETECTED NOT DETECTED Final   Listeria monocytogenes NOT DETECTED NOT DETECTED Final   Staphylococcus species NOT DETECTED NOT DETECTED Final   Staphylococcus aureus (BCID) NOT DETECTED NOT DETECTED Final   Staphylococcus epidermidis NOT DETECTED NOT DETECTED Final   Staphylococcus lugdunensis NOT DETECTED NOT DETECTED Final   Streptococcus species NOT DETECTED NOT DETECTED Final   Streptococcus agalactiae NOT DETECTED NOT DETECTED Final   Streptococcus pneumoniae NOT DETECTED NOT DETECTED Final   Streptococcus pyogenes NOT DETECTED NOT DETECTED Final   A.calcoaceticus-baumannii NOT DETECTED NOT DETECTED Final   Bacteroides fragilis NOT DETECTED NOT DETECTED Final   Enterobacterales DETECTED (A) NOT DETECTED Final    Comment: Enterobacterales represent a large order of gram negative bacteria, not a single organism. CRITICAL RESULT CALLED TO, READ BACK BY AND VERIFIED WITH: [place] RN [phone] 12/18/20 HNM    Enterobacter cloacae complex NOT DETECTED NOT DETECTED Final   Escherichia coli NOT DETECTED NOT DETECTED Final   Klebsiella aerogenes NOT DETECTED NOT DETECTED Final   Klebsiella oxytoca NOT  DETECTED NOT DETECTED Final   Klebsiella pneumoniae DETECTED (A) NOT DETECTED Final    Comment: CRITICAL RESULT CALLED TO, READ BACK BY AND VERIFIED WITH: [facility] [place] [facility] RN [phone] 12/18/20 HNM    Proteus species NOT DETECTED NOT DETECTED Final   Salmonella species NOT DETECTED NOT DETECTED Final   Serratia marcescens NOT DETECTED NOT DETECTED Final   Haemophilus influenzae NOT DETECTED NOT DETECTED Final   Neisseria meningitidis NOT DETECTED NOT DETECTED Final   Pseudomonas aeruginosa NOT DETECTED NOT DETECTED  Final   Stenotrophomonas maltophilia NOT DETECTED NOT DETECTED Final   Candida albicans NOT DETECTED NOT DETECTED Final   Candida auris NOT DETECTED NOT DETECTED Final   Candida glabrata NOT DETECTED NOT DETECTED Final   Candida krusei NOT DETECTED NOT DETECTED Final   Candida parapsilosis NOT DETECTED NOT DETECTED Final   Candida tropicalis NOT DETECTED NOT DETECTED Final   Cryptococcus neoformans/gattii NOT DETECTED NOT DETECTED Final   CTX-M ESBL NOT DETECTED NOT DETECTED Final   Carbapenem resistance IMP NOT DETECTED NOT DETECTED Final   Carbapenem resistance KPC NOT DETECTED NOT DETECTED Final   Carbapenem resistance NDM NOT DETECTED NOT DETECTED Final   Carbapenem resist OXA 48 LIKE NOT DETECTED NOT DETECTED Final   Carbapenem resistance VIM NOT DETECTED NOT DETECTED Final    Comment: Performed at [facility] [place], [address] [place], [place] [address]  Resp Panel by RT-PCR (Flu A&B, Covid) Nasopharyngeal Swab     Status: None   Collection Time: 12/17/20 12:37 PM   Specimen: Nasopharyngeal Swab; Nasopharyngeal(NP) swabs in vial transport medium  Result Value Ref Range Status   SARS Coronavirus 2 by RT PCR NEGATIVE NEGATIVE Final    Comment: (NOTE) SARS-CoV-2 target nucleic acids are NOT DETECTED.  The SARS-CoV-2 RNA is generally detectable in upper respiratory specimens during the acute phase of infection. The lowest concentration of SARS-CoV-2 viral copies this  assay can detect is 138 copies/mL. A negative result does not preclude SARS-Cov-2 infection and should not be used as the sole basis for treatment or other patient management decisions. A negative result may occur with  improper specimen collection/handling, submission of specimen other than nasopharyngeal swab, presence of viral mutation(s) within the areas targeted by this assay, and inadequate number of viral copies(<138 copies/mL). A negative result must be combined with clinical observations, patient history, and epidemiological information. The expected result is Negative.  Fact Sheet for Patients:  EntrepreneurPulse.com.au  Fact Sheet for Healthcare Providers:  IncredibleEmployment.be  This test is no t yet approved or cleared by the [place] FDA and  has been authorized for detection and/or diagnosis of SARS-CoV-2 by FDA under an Emergency Use Authorization (EUA). This EUA will remain  in effect (meaning this test can be used) for the duration of the COVID-19 declaration under Section 564(b)(1) of the Act, 21 U.S.C.section 360bbb-3(b)(1), unless the authorization is terminated  or revoked sooner.       Influenza A by PCR NEGATIVE NEGATIVE Final   Influenza B by PCR NEGATIVE NEGATIVE Final    Comment: (NOTE) The Xpert Xpress SARS-CoV-2/FLU/RSV plus assay is intended as an aid in the diagnosis of influenza from Nasopharyngeal swab specimens and should not be used as a sole basis for treatment. Nasal washings and aspirates are unacceptable for Xpert Xpress SARS-CoV-2/FLU/RSV testing.  Fact Sheet for Patients: EntrepreneurPulse.com.au  Fact Sheet for Healthcare Providers: IncredibleEmployment.be  This test is not yet approved or cleared by the [place] FDA and has been authorized for detection and/or diagnosis of SARS-CoV-2 by FDA under an Emergency Use Authorization (EUA). This EUA will  remain in effect (meaning this test can be used) for the duration of the COVID-19 declaration under Section 564(b)(1) of the Act, 21 U.S.C. section 360bbb-3(b)(1), unless the authorization is terminated or revoked.  Performed at [facility], [address], [place], [place] [address]   Urine Culture     Status: None   Collection Time: 12/17/20  3:32 PM   Specimen: In/Out Cath Urine  Result  Value Ref Range Status   Specimen Description   Final    IN/OUT CATH URINE Performed at [facility], [address], [place], [place] [address]    Special Requests   Final    NONE Performed at [facility], [address], [place], [place] [address]    Culture   Final    NO GROWTH Performed at [facility], [place] [address], [place], [place] [address]    Report Status 12/19/2020 FINAL  Final  C Difficile Quick Screen w PCR reflex     Status: None   Collection Time: 12/17/20  3:32 PM   Specimen: STOOL  Result Value Ref Range Status   C Diff antigen NEGATIVE NEGATIVE Final   C Diff toxin NEGATIVE NEGATIVE Final   C Diff interpretation No C. difficile detected.  Final    Comment: Performed at [facility], [address] [place], [place] [address]  MRSA Next Gen by PCR, Nasal     Status: Abnormal   Collection Time: 12/18/20  9:55 PM   Specimen: Nasal Mucosa; Nasal Swab  Result Value Ref Range Status   MRSA by PCR Next Gen DETECTED (A) NOT DETECTED Final    Comment: RESULT CALLED TO, READ BACK BY AND VERIFIED WITH: BETH BUONO @ 2327 12/18/20 LFD (NOTE) The GeneXpert MRSA Assay (FDA approved for NASAL specimens only), is one component of a comprehensive MRSA colonization surveillance program. It is not intended to diagnose MRSA infection nor to guide or monitor treatment for MRSA infections. Test performance is not FDA approved in patients less than 81 years old. Performed at [facility], [address] [place], [place]  [address]          Radiology Studies: CT ABDOMEN PELVIS WO CONTRAST  Result Date: 12/17/2020 CLINICAL DATA:  Nausea and vomiting EXAM: CT ABDOMEN AND PELVIS WITHOUT CONTRAST TECHNIQUE: Multidetector CT imaging of the abdomen and pelvis was performed following the standard protocol without IV contrast. COMPARISON:  CT 06/23/2020, PET CT 11/19/2020 FINDINGS: Lower chest: Lung bases demonstrate emphysema. Subpleural bandlike airspace disease at the left lower lobe redemonstrated. Incompletely visualized subpleural peripheral left upper lobe nodule measuring 11 mm, series 5, image 1. Cardiomegaly with trace pericardial effusion. Coronary vascular calcification. Hepatobiliary: Extensive motion degradation. No focal hepatic abnormality. Gallstones. Gallbladder slightly distended. Possible wall thickening or pericholecystic fluid though limited by motion Pancreas: Unremarkable. No pancreatic ductal dilatation or surrounding inflammatory changes. Spleen: Normal in size without focal abnormality. Adrenals/Urinary Tract: Thickened appearance of adrenal glands. No hydronephrosis. The bladder is unremarkable Stomach/Bowel: Stomach nonenlarged. No dilated small bowel. Negative appendix. No acute bowel wall thickening Vascular/Lymphatic: Advanced aortic atherosclerosis. No aneurysm. Slightly increased multiple small retroperitoneal lymph nodes. Reproductive: Prostate is unremarkable. Other: Negative for free air or free fluid Musculoskeletal: No acute osseous abnormality. IMPRESSION: 1. Degraded by motion. 2. Gallbladder appears slightly distended and there are gallstones. Possible gallbladder wall thickening or pericholecystic fluid though limited by motion, consider correlation with ultrasound 3. Cardiomegaly with trace pericardial effusion. 4. Similar subpleural bandlike density at left lower lobe. Incompletely visualized peripheral left upper lobe lung nodule characterized on recent PET CT Electronically Signed   By:  Donavan Foil M.D.   On: 12/17/2020 16:50   CT HEAD WO CONTRAST (5MM)  Result Date: 12/17/2020 CLINICAL DATA:  Nausea vomiting EXAM: CT HEAD WITHOUT CONTRAST TECHNIQUE: Contiguous axial images were obtained from the base of the skull through the vertex without intravenous contrast. COMPARISON:  CT brain  06/24/2020, MRI 03/10/2020, head CT 11/22/2019 FINDINGS: Brain: No acute territorial infarction, hemorrhage or intracranial mass. Extensive white matter disease, progressed from more remote exams from 2021. Stable ventricle size. Chronic lacunar infarcts within the left white matter. Vascular: No hyperdense vessel.  Carotid vascular calcification Skull: Normal. Negative for fracture or focal lesion. Sinuses/Orbits: Mucosal thickening the sinuses. Chronic appearing nasal deformity Other: None IMPRESSION: 1. No definite CT evidence for acute intracranial abnormality. 2. Atrophy. Extensive white matter disease, grossly stable as compared with recent head CT from March, appears progressive when compared to exams from 2021 and prior Electronically Signed   By: Donavan Foil M.D.   On: 12/17/2020 16:39   US Abdomen Limited RUQ (LIVER/GB)  Result Date: 12/18/2020 CLINICAL DATA:  Evaluate for cholecystitis. EXAM: ULTRASOUND ABDOMEN LIMITED RIGHT UPPER QUADRANT COMPARISON:  CT AP from 12/17/2020 FINDINGS: Gallbladder: The gallbladder wall is diffusely edematous measuring up to 13.1 mm. Gallstones are noted measuring up to 5.9 mm. Pericholecystic fluid. Positive sonographic Murphy's sign. Common bile duct: Diameter: 6 mm Liver: Diffusely increased parenchymal echogenicity identified. No focal liver abnormality. Portal vein is patent on color Doppler imaging with normal direction of blood flow towards the liver. Other: 1.9 cm right kidney cyst. IMPRESSION: Gallstones, gallbladder wall thickening and pericholecystic fluid with positive sonographic Murphy's sign. Findings are compatible with acute cholecystitis.  Electronically Signed   By: Kerby Moors M.D.   On: 12/18/2020 14:18        Scheduled Meds:  chlorhexidine  10 mL Mouth/Throat BID   Chlorhexidine Gluconate Cloth  6 each Topical Daily   Chlorhexidine Gluconate Cloth  6 each Topical Q0600   diltiazem  240 mg Oral Daily   dronabinol  2.5 mg Oral BID AC   feeding supplement  237 mL Oral TID BM   folic acid  1 mg Oral Daily   mupirocin ointment  1 application Nasal BID   thiamine  100 mg Oral Daily   Continuous Infusions:  cefTRIAXone (ROCEPHIN)  IV Stopped (12/19/20 0542)   potassium chloride 10 mEq (12/19/20 1231)     LOS: 2 days    Time spent: 32 minutes    Sharen Hones, MD [facility]   To contact the attending provider between 7A-7P or the covering provider during after hours 7P-7A, please log into the web site [email or web address] and access using universal [place] password for that web site. If you do not have the password, please call the hospital operator.  12/19/2020, 1:00 PM

## 2020-12-20 ENCOUNTER — Other Ambulatory Visit: Payer: Medicare Other

## 2020-12-20 ENCOUNTER — Inpatient Hospital Stay: Payer: Medicare Other | Admitting: Radiology

## 2020-12-20 ENCOUNTER — Inpatient Hospital Stay: Payer: Medicare Other

## 2020-12-20 DIAGNOSIS — A414 Sepsis due to anaerobes: Secondary | ICD-10-CM | POA: Diagnosis not present

## 2020-12-20 DIAGNOSIS — D709 Neutropenia, unspecified: Secondary | ICD-10-CM | POA: Diagnosis not present

## 2020-12-20 DIAGNOSIS — D6481 Anemia due to antineoplastic chemotherapy: Secondary | ICD-10-CM | POA: Diagnosis not present

## 2020-12-20 DIAGNOSIS — K8000 Calculus of gallbladder with acute cholecystitis without obstruction: Secondary | ICD-10-CM

## 2020-12-20 DIAGNOSIS — A419 Sepsis, unspecified organism: Secondary | ICD-10-CM | POA: Diagnosis not present

## 2020-12-20 HISTORY — PX: IR PERC CHOLECYSTOSTOMY: IMG2326

## 2020-12-20 LAB — CBC WITH DIFFERENTIAL/PLATELET
Abs Immature Granulocytes: 0.46 10*3/uL — ABNORMAL HIGH (ref 0.00–0.07)
Basophils Absolute: 0 10*3/uL (ref 0.0–0.1)
Basophils Relative: 1 %
Eosinophils Absolute: 0 10*3/uL (ref 0.0–0.5)
Eosinophils Relative: 0 %
HCT: 24 % — ABNORMAL LOW (ref 39.0–52.0)
Hemoglobin: 8.9 g/dL — ABNORMAL LOW (ref 13.0–17.0)
Immature Granulocytes: 28 %
Lymphocytes Relative: 18 %
Lymphs Abs: 0.3 10*3/uL — ABNORMAL LOW (ref 0.7–4.0)
MCH: 35 pg — ABNORMAL HIGH (ref 26.0–34.0)
MCHC: 37.1 g/dL — ABNORMAL HIGH (ref 30.0–36.0)
MCV: 94.5 fL (ref 80.0–100.0)
Monocytes Absolute: 0.2 10*3/uL (ref 0.1–1.0)
Monocytes Relative: 14 %
Neutro Abs: 0.7 10*3/uL — ABNORMAL LOW (ref 1.7–7.7)
Neutrophils Relative %: 39 %
Platelets: 42 10*3/uL — ABNORMAL LOW (ref 150–400)
RBC: 2.54 MIL/uL — ABNORMAL LOW (ref 4.22–5.81)
RDW: 12.9 % (ref 11.5–15.5)
WBC: 1.7 10*3/uL — ABNORMAL LOW (ref 4.0–10.5)
nRBC: 3 % — ABNORMAL HIGH (ref 0.0–0.2)

## 2020-12-20 LAB — CULTURE, BLOOD (ROUTINE X 2)
Special Requests: ADEQUATE
Special Requests: ADEQUATE

## 2020-12-20 LAB — PLATELET COUNT: Platelets: 28 K/uL — CL (ref 150–400)

## 2020-12-20 LAB — BPAM PLATELET PHERESIS
Blood Product Expiration Date: 202209102359
ISSUE DATE / TIME: 202209090053
Unit Type and Rh: 9500

## 2020-12-20 LAB — BASIC METABOLIC PANEL WITH GFR
Anion gap: 8 (ref 5–15)
BUN: 36 mg/dL — ABNORMAL HIGH (ref 8–23)
CO2: 22 mmol/L (ref 22–32)
Calcium: 7.6 mg/dL — ABNORMAL LOW (ref 8.9–10.3)
Chloride: 107 mmol/L (ref 98–111)
Creatinine, Ser: 0.72 mg/dL (ref 0.61–1.24)
GFR, Estimated: >60 mL/min
Glucose, Bld: 112 mg/dL — ABNORMAL HIGH (ref 70–99)
Potassium: 3.4 mmol/L — ABNORMAL LOW (ref 3.5–5.1)
Sodium: 137 mmol/L (ref 135–145)

## 2020-12-20 LAB — PREPARE PLATELET PHERESIS: Unit division: 0

## 2020-12-20 LAB — MAGNESIUM: Magnesium: 2.1 mg/dL (ref 1.7–2.4)

## 2020-12-20 MED ORDER — GADOBUTROL 1 MMOL/ML IV SOLN
6.0000 mL | Freq: Once | INTRAVENOUS | Status: AC | PRN
  Administered 2020-12-20: 7.5 mL via INTRAVENOUS

## 2020-12-20 MED ORDER — SODIUM CHLORIDE 0.9% IV SOLUTION: Freq: Once | INTRAVENOUS | Status: DC

## 2020-12-20 MED ORDER — SODIUM CHLORIDE 0.9 % IV SOLN
INTRAVENOUS | Status: DC | PRN
  Administered 2020-12-20: 10 mL/h via INTRAVENOUS

## 2020-12-20 MED ORDER — HYDROMORPHONE HCL 1 MG/ML IJ SOLN
INTRAMUSCULAR | Status: AC
  Filled 2020-12-20: qty 1

## 2020-12-20 MED ORDER — SODIUM CHLORIDE 0.9 % IV SOLN
INTRAVENOUS | Status: DC | PRN
Start: 2020-12-20 — End: 2021-01-04
  Administered 2020-12-20 – 2020-12-24 (×2): 250 mL via INTRAVENOUS

## 2020-12-20 MED ORDER — POTASSIUM CHLORIDE 20 MEQ PO PACK: 40.0000 meq | PACK | Freq: Once | ORAL | Status: DC

## 2020-12-20 MED ORDER — MIDAZOLAM HCL 2 MG/2ML IJ SOLN
INTRAMUSCULAR | Status: AC
  Filled 2020-12-20: qty 2

## 2020-12-20 MED ORDER — HYDROMORPHONE HCL 1 MG/ML IJ SOLN
INTRAMUSCULAR | Status: DC | PRN
  Administered 2020-12-20: .5 mg via INTRAVENOUS

## 2020-12-20 MED ORDER — PROSOURCE PLUS PO LIQD
30.0000 mL | Freq: Two times a day (BID) | ORAL | Status: DC
  Administered 2020-12-20 – 2020-12-21 (×2): 30 mL via ORAL
  Filled 2020-12-20: qty 30

## 2020-12-20 MED ORDER — ADULT MULTIVITAMIN W/MINERALS CH
1.0000 | ORAL_TABLET | Freq: Every day | ORAL | Status: DC
  Administered 2020-12-20 – 2021-01-04 (×13): 1 via ORAL
  Filled 2020-12-20 (×17): qty 1

## 2020-12-20 MED ORDER — MIDAZOLAM HCL 2 MG/2ML IJ SOLN
INTRAMUSCULAR | Status: DC | PRN
  Administered 2020-12-20: 1 mg via INTRAVENOUS

## 2020-12-20 MED ORDER — FENTANYL CITRATE (PF) 100 MCG/2ML IJ SOLN
INTRAMUSCULAR | Status: DC | PRN
  Administered 2020-12-20: 50 ug via INTRAVENOUS

## 2020-12-20 MED ORDER — IODIXANOL 320 MG/ML IV SOLN
100.0000 mL | Freq: Once | INTRAVENOUS | Status: AC | PRN
  Administered 2020-12-20: 10 mL

## 2020-12-20 MED ORDER — SODIUM CHLORIDE 0.9% FLUSH
5.0000 mL | Freq: Three times a day (TID) | INTRAVENOUS | Status: DC
  Administered 2020-12-20 – 2021-01-04 (×41): 5 mL

## 2020-12-20 MED ORDER — FENTANYL CITRATE PF 50 MCG/ML IJ SOSY
PREFILLED_SYRINGE | INTRAMUSCULAR | Status: AC
  Filled 2020-12-20: qty 1

## 2020-12-20 MED ORDER — BOOST / RESOURCE BREEZE PO LIQD CUSTOM
1.0000 | Freq: Three times a day (TID) | ORAL | Status: DC
  Administered 2020-12-21 – 2020-12-23 (×3): 1 via ORAL

## 2020-12-20 MED ORDER — POTASSIUM CHLORIDE IN NACL 20-0.9 MEQ/L-% IV SOLN
INTRAVENOUS | Status: DC
  Filled 2020-12-20 (×5): qty 1000

## 2020-12-20 MED ORDER — POTASSIUM CHLORIDE 10 MEQ/100ML IV SOLN
10.0000 meq | INTRAVENOUS | Status: AC
  Administered 2020-12-20 (×3): 10 meq via INTRAVENOUS
  Filled 2020-12-20 (×3): qty 100

## 2020-12-20 NOTE — Evaluation (Signed)
Physical Therapy Evaluation Patient Details Name: Scott Jordan MRN: [id number] DOB: Nov 17, 1950 Today's Date: 12/20/2020   History of Present Illness  Pt is a 70 y.o. M arriving to ED for c/o nausea, vomiting and admitted for neutropenic sepsis,severe neutropenia, cholecystitis. PMH includes stage IV small cell carcinoma, anemia, thrombocytopenia, hx of substance abuse, chronic hepatitis C, HTN., a-fib.  Clinical Impression  Pt fatigued, awakens to verbal and tactile cues. Pt willing to participate in bed level mobility, denies participation with OOB mobility due to fatigue. Per pt, PLOF is mod-I for amb w/ SPC for household distances, and requires assistance for some ADLs like appointments, cooking, & groceries. Pt's friends bring groceries and food as needed.   Upon examination, SpO2 levels at rest 88-89% w/ good waveform, congestion and coughing noted throughout treatment. Unable to obtain accurate readings with upright sitting despite x2 pulseoximeters.Weakness in both UE and LE w/ difficulty lifting against gravity. Pt also notes recent decreased RUE strength in which he's unable to lift or grasp. Pt did demonstrate x 1 ability to reach across body w/o AAROM assist. Bed mobility required max-A +2 physical assist. Poor sitting balance w/ posterior lean requiring upright assist w/ slight improvement end of session, pt resting arms on legs. Pt denied OOB mobility due to fatigue. Pt left working with OT end of session. Due to significant changes from [facility] SNF is recommended at discharge. Skilled PT intervention is indicated to address deficits in function, mobility, and to return to PLOF as able.      Follow Up Recommendations SNF;Supervision/Assistance - 24 hour    Equipment Recommendations  Other (comment) (TBD next venue of care)    Recommendations for Other Services       Precautions / Restrictions Precautions Precautions: Fall Precaution Comments: High fall Restrictions Weight Bearing  Restrictions: No      Mobility  Bed Mobility Overal bed mobility: Needs Assistance Bed Mobility: Supine to Sit;Sit to Supine     Supine to sit: +2 for physical assistance;Max assist Sit to supine: +2 for physical assistance;Max assist   General bed mobility comments: requires full trunk and bilat LE assit, pt is able to lift head for scooting in bed but unable to lift hips or fully reach for bed rails    Transfers Overall transfer level: Needs assistance               General transfer comment: Not assessed due to pt refusal  Ambulation/Gait                Stairs            Wheelchair Mobility    Modified Rankin (Stroke Patients Only)       Balance Overall balance assessment: Needs assistance Sitting-balance support: Feet supported;Bilateral upper extremity supported Sitting balance-Leahy Scale: Poor Sitting balance - Comments: Requires intermittent support due to posterior lean, able to support trunk with fwd trunk flexion, elbows on knees w/o support                                     Pertinent Vitals/Pain Pain Assessment: Faces Faces Pain Scale: Hurts a little bit Pain Location: Lower back Pain Descriptors / Indicators: Aching Pain Intervention(s): Limited activity within patient's tolerance;Monitored during session;Repositioned    Home Living Family/patient expects to be discharged to:: Private residence Living Arrangements: Non-relatives/Friends Available Help at Discharge: Friend(s) Type of Home: House Home Access: Level entry  Home Layout: One level Home Equipment: Cane - single point      Prior Function Level of Independence: Independent with assistive device(s)         Comments: Pt amb household distances w/ SPC. Pt no longer drives, friends bring groceries and prepare meals.     Hand Dominance        Extremity/Trunk Assessment   Upper Extremity Assessment Upper Extremity Assessment: Overall WFL  for tasks assessed (decreased hand function bilaterally, unable to grasp w/ R hand)    Lower Extremity Assessment Lower Extremity Assessment: Generalized weakness (SILT, difficulty moving against gravity w/o AAROM assistance)    Cervical / Trunk Assessment Cervical / Trunk Assessment: Kyphotic  Communication   Communication: No difficulties  Cognition Arousal/Alertness: Awake/alert Behavior During Therapy: Agitated;WFL for tasks assessed/performed Overall Cognitive Status: Within Functional Limits for tasks assessed                                 General Comments: AOx4, difficulty staying on topic w/ unfocused attention      General Comments      Exercises     Assessment/Plan    PT Assessment Patient needs continued PT services  PT Problem List Decreased range of motion;Decreased activity tolerance;Decreased balance;Decreased mobility;Decreased strength       PT Treatment Interventions      PT Goals (Current goals can be found in the Care Plan section)  Acute Rehab PT Goals Patient Stated Goal: To get stronger PT Goal Formulation: With patient Time For Goal Achievement: 01/03/21 Potential to Achieve Goals: Fair    Frequency Min 2X/week   Barriers to discharge        Co-evaluation               AM-PAC PT "6 Clicks" Mobility  Outcome Measure Help needed turning from your back to your side while in a flat bed without using bedrails?: A Lot Help needed moving from lying on your back to sitting on the side of a flat bed without using bedrails?: A Lot Help needed moving to and from a bed to a chair (including a wheelchair)?: Total Help needed standing up from a chair using your arms (e.g., wheelchair or bedside chair)?: Total Help needed to walk in hospital room?: Total Help needed climbing 3-5 steps with a railing? : Total 6 Click Score: 8    End of Session   Activity Tolerance: Patient limited by fatigue Patient left: in bed;Other  (comment) Nurse Communication: Mobility status (Vitals) PT Visit Diagnosis: Repeated falls (R29.6);Muscle weakness (generalized) (M62.81);Difficulty in walking, not elsewhere classified (R26.2);History of falling (Z91.81)    Time: 1049-1130 PT Time Calculation (min) (ACUTE ONLY): 41 min   Charges:             [employer], SPT

## 2020-12-20 NOTE — Progress Notes (Signed)
PROGRESS NOTE    Scott Jordan  GEX:[id number] DOB: 11/08/1950 DOA: 12/17/2020 PCP: Earlie Server, MD   Chief complaint.  Nausea vomiting. Brief Narrative:  Scott Jordan is a 70 y.o. 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  Upon arriving the hospital, patient was found to be septic with elevated respirate, tachycardia and severe neutropenia.  She was placed on broad-spectrum antibiotics.  Blood culture came back positive for Klebsiella, antibiotic switched to Rocephin. Right upper quadrant ultrasound performed on 9/10 confirmed cholelithiasis with cholecystitis.  IR will perform cholecystostomy.  Assessment & Plan:   Principal Problem:   Neutropenic sepsis ([place]) Active Problems:   Protein-calorie malnutrition, severe   Small cell lung cancer ([place])   Bone metastasis (HCC)   Chemotherapy-induced neutropenia (HCC)   Antineoplastic chemotherapy induced anemia   Thrombocytopenia (HCC)   Atypical atrial flutter (HCC)   Chronic anticoagulation   Alcohol abuse   History of substance abuse ([place])   Hypertension, essential   B12 deficiency   Septicemia due to Klebsiella pneumoniae ([place])   Cholecystitis  #1.  Neutropenic sepsis. Septicemia secondary to Klebsiella pneumoniae. Klebsiella pneumonia ruled out. Cholelithiasis with cholecystitis secondary to Klebsiella pneumoniae. Patient sepsis appears to be secondary to cholecystitis. Patient is treated with Rocephin, condition improving.  Patient currently is not hemodynamically stable. Continue Rocephin. IR will perform cholecystostomy today  following transfusion of platelets.   #2.  Small cell lung cancer. Pancytopenia related to chemotherapy. Right arm weakness. Patient received 2 units of platelets yesterday, will give another unit today before cholecystostomy. Neutropenia is improving.  Patient is followed by oncology. Patient complaining of new right arm weakness, he could not lift his right arm.  Due to malignancy, I will obtain MRI with contrast to rule out intracranial metastasis.  #3.  Paroxysmal atrial fibrillation. Not a candidate for anticoagulation due to severe thrombocytopenia.  4.  Severe protein calorie malnutrition. Anorexia. Anorexia could be from chemotherapy and cholecystitis. Patient is on Marinol for anorexia.  Also on protein supplements. Patient appetite still poor today, will continue to monitor.  5.  Acute kidney injury. Hypokalemia. Hyponatremia. Condition improved after giving IV fluids. Will continue low-dose IV fluids as patient still has adequate p.o. intake. Continue replete potassium.  #6.  Alcohol use disorder. No evidence of withdrawal.     DVT prophylaxis: SCDs Code Status: full Family Communication:  Disposition Plan:    Status is: Inpatient  Remains inpatient appropriate because:Ongoing diagnostic testing needed not appropriate for outpatient work up, IV treatments appropriate due to intensity of illness or inability to take PO, and Inpatient level of care appropriate due to severity of illness  Dispo: The patient is from: Home              Anticipated d/c is to: Home              Patient currently is not medically stable to d/c.   Difficult to place patient No        I/O last 3 completed shifts: In: 2962 [P.O.:760; I.V.:1160.4; Blood:703; IV Piggyback:338.6] Out: 0102 [Urine:1645] No intake/output data recorded.     Consultants:  IR, General surgery, oncology  Procedures: Pending cholecystostomy.  Antimicrobials: Rocephin.  Subjective:  Patient  still tired, poor appetite, but no nausea vomiting. He states that he could not lift his right arm this morning, he also has some pain in bilateral legs. No fever or chills. No short of breath or cough. No dysuria hematuria.  Objective: Vitals:   12/20/20 0233 12/20/20 0300 12/20/20 0600 12/20/20 0819  BP: 128/74 110/66 112/70 114/65  Pulse: 99 88 88 81  Resp: 20 18  20   Temp: 98.4 F (36.9 C) 98.6 F (37 C) 98.6 F (37 C) 98 F (36.7 C)  TempSrc:  Oral  Oral  SpO2: 98% 98% 98% 93%  Weight:      Height:        Intake/Output Summary (Last 24 hours) at 12/20/2020 1054 Last data filed at 12/20/2020 0520 Gross per 24 hour  Intake 823 ml  Output 650 ml  Net 173 ml   Filed Weights   12/17/20 1203 12/18/20 2130  Weight: 61.7 kg 62.2 kg    Examination:  General exam: Appears calm and comfortable  Respiratory system: Clear to auscultation. Respiratory effort normal. Cardiovascular system: S1 & S2 heard, RRR. No JVD, murmurs, rubs, gallops or clicks. No pedal edema. Gastrointestinal system: Abdomen is nondistended, soft and nontender. No organomegaly or masses felt. Normal bowel sounds heard. Central nervous system: Alert and oriented. No focal neurological deficits. Extremities: Right arm weakness. Skin: No rashes, lesions or ulcers Psychiatry: Judgement and insight appear normal. Mood & affect appropriate.     Data Reviewed: I have personally reviewed following labs and imaging studies  CBC: Recent Labs  Lab 12/17/20 1205 12/17/20 2000 12/18/20 0254 12/18/20 0857 12/19/20 0457 12/20/20 0047 12/20/20 0500  WBC 0.2* 0.4*  --  0.4* 0.6*  --  1.7*  NEUTROABS 0.0*  --   --  0.1* 0.3*  --  0.7*  HGB 10.1* 9.8*  --  9.2* 8.9*  --  8.9*  HCT 27.0* 26.1*  --  25.6* 23.8*  --  24.0*  MCV 95.4 96.3  --  94.1 94.4  --  94.5  PLT 7* 8* 28* 16* 9* 28* 42*   Basic Metabolic Panel: Recent Labs  Lab 12/17/20 1205 12/18/20 0500 12/19/20 0457 12/20/20 0500  NA 136 135 138  137  K 3.3* 3.0* 3.3* 3.4*  CL 101 101 110 107  CO2 22 21* 22 22  GLUCOSE 209* 166* 118* 112*  BUN 31* 48* 43* 36*  CREATININE 1.45* 1.46* 0.94 0.72  CALCIUM 8.0* 7.4* 7.0* 7.6*  MG  --   --  1.6* 2.1   GFR: Estimated Creatinine Clearance: 76.7 mL/min (by C-G formula based on SCr of 0.72 mg/dL). Liver Function Tests: Recent Labs  Lab 12/17/20 1205  AST 51*  ALT 32  ALKPHOS 50  BILITOT 1.8*  PROT 6.3*  ALBUMIN 3.1*   No results for input(s): LIPASE, AMYLASE in the last 168 hours. No results for input(s): AMMONIA in the last 168 hours. Coagulation Profile: Recent Labs  Lab 12/17/20 1205 12/18/20 0500  INR 1.5* 1.6*   Cardiac Enzymes: No results for input(s): CKTOTAL, CKMB, CKMBINDEX, TROPONINI in the last 168 hours. BNP (last 3 results) No results for input(s): PROBNP in the last 8760 hours. HbA1C: No results for input(s): HGBA1C in the last 72 hours. CBG: No results for input(s): GLUCAP in the last 168 hours. Lipid Profile: No results for input(s): CHOL, HDL, LDLCALC, TRIG, CHOLHDL, LDLDIRECT in the last 72 hours. Thyroid Function Tests: No results for input(s): TSH, T4TOTAL, FREET4, T3FREE, THYROIDAB in  the last 72 hours. Anemia Panel: No results for input(s): VITAMINB12, FOLATE, FERRITIN, TIBC, IRON, RETICCTPCT in the last 72 hours. Sepsis Labs: Recent Labs  Lab 12/17/20 1205 12/17/20 1532 12/17/20 1926 12/18/20 0500 12/19/20 0953  PROCALCITON  --   --   --  24.39  --   LATICACIDVEN 4.0* 5.4* 6.1*  --  1.9    Recent Results (from the past 240 hour(s))  Blood Culture (routine x 2)     Status: Abnormal (Preliminary result)   Collection Time: 12/17/20 12:05 PM   Specimen: BLOOD  Result Value Ref Range Status   Specimen Description   Final    BLOOD LEFT CHEST PORT Performed at [facility], [address] [place], [place] [address]    Special Requests   Final    BOTTLES DRAWN AEROBIC AND ANAEROBIC Blood Culture adequate volume Performed at  [facility], [address], [place], [place] [address]    Culture  Setup Time   Final    GRAM NEGATIVE RODS IN BOTH AEROBIC AND ANAEROBIC BOTTLES CRITICAL VALUE NOTED.  VALUE IS CONSISTENT WITH PREVIOUSLY REPORTED AND CALLED VALUE. Performed at [facility], [address], [place], [place] [address]    Culture (A)  Final    KLEBSIELLA PNEUMONIAE CULTURE REINCUBATED FOR BETTER GROWTH Performed at [facility], [place] [address] [place], [place] [address]    Report Status PENDING  Incomplete  Blood Culture (routine x 2)     Status: Abnormal (Preliminary result)   Collection Time: 12/17/20 12:05 PM   Specimen: BLOOD  Result Value Ref Range Status   Specimen Description   Final    BLOOD LEFT CHEST Performed at [facility], [address], [place], [place] [address]    Special Requests   Final    BOTTLES DRAWN AEROBIC AND ANAEROBIC Blood Culture adequate volume Performed at [facility], [address], [place], [place] [address]    Culture  Setup Time   Final    GRAM NEGATIVE RODS IN BOTH AEROBIC AND ANAEROBIC BOTTLES Organism ID to follow CRITICAL RESULT CALLED TO, READ BACK BY AND VERIFIED WITH: [place] [phone] 12/18/20 HNM Performed at [facility], [address] [place], [place] [address]    Culture (A)  Final    KLEBSIELLA PNEUMONIAE CULTURE REINCUBATED FOR BETTER GROWTH Performed at [facility], [place] [address], [place], [place] [address]    Report Status PENDING  Incomplete  Blood Culture ID Panel (Reflexed)     Status: Abnormal   Collection Time: 12/17/20 12:05 PM  Result Value Ref Range Status   Enterococcus faecalis NOT DETECTED NOT DETECTED Final   Enterococcus Faecium NOT DETECTED NOT DETECTED Final   Listeria monocytogenes NOT DETECTED NOT DETECTED Final   Staphylococcus species NOT DETECTED NOT DETECTED Final   Staphylococcus aureus (BCID) NOT DETECTED NOT DETECTED Final   Staphylococcus  epidermidis NOT DETECTED NOT DETECTED Final   Staphylococcus lugdunensis NOT DETECTED NOT DETECTED Final   Streptococcus species NOT DETECTED NOT DETECTED Final   Streptococcus agalactiae NOT DETECTED NOT DETECTED Final   Streptococcus pneumoniae NOT DETECTED NOT DETECTED Final   Streptococcus pyogenes NOT DETECTED NOT DETECTED Final   A.calcoaceticus-baumannii NOT DETECTED NOT DETECTED Final   Bacteroides fragilis NOT DETECTED NOT DETECTED Final   Enterobacterales DETECTED (A) NOT DETECTED Final    Comment: Enterobacterales represent a large order of gram negative bacteria, not a single organism. CRITICAL RESULT CALLED TO, READ BACK  BY AND VERIFIED WITH: [place] RN [phone] 12/18/20 HNM    Enterobacter cloacae complex NOT DETECTED NOT DETECTED Final   Escherichia coli NOT DETECTED NOT DETECTED Final   Klebsiella aerogenes NOT DETECTED NOT DETECTED Final   Klebsiella oxytoca NOT DETECTED NOT DETECTED Final   Klebsiella pneumoniae DETECTED (A) NOT DETECTED Final    Comment: CRITICAL RESULT CALLED TO, READ BACK BY AND VERIFIED WITH: [place] RN [phone] 12/18/20 HNM    Proteus species NOT DETECTED NOT DETECTED Final   Salmonella species NOT DETECTED NOT DETECTED Final   Serratia marcescens NOT DETECTED NOT DETECTED Final   Haemophilus influenzae NOT DETECTED NOT DETECTED Final   Neisseria meningitidis NOT DETECTED NOT DETECTED Final   Pseudomonas aeruginosa NOT DETECTED NOT DETECTED Final   Stenotrophomonas maltophilia NOT DETECTED NOT DETECTED Final   Candida albicans NOT DETECTED NOT DETECTED Final   Candida auris NOT DETECTED NOT DETECTED Final   Candida glabrata NOT DETECTED NOT DETECTED Final   Candida krusei NOT DETECTED NOT DETECTED Final   Candida parapsilosis NOT DETECTED NOT DETECTED Final   Candida tropicalis NOT DETECTED NOT DETECTED Final   Cryptococcus neoformans/gattii NOT DETECTED NOT DETECTED Final   CTX-M ESBL NOT DETECTED NOT DETECTED Final   Carbapenem resistance  IMP NOT DETECTED NOT DETECTED Final   Carbapenem resistance KPC NOT DETECTED NOT DETECTED Final   Carbapenem resistance NDM NOT DETECTED NOT DETECTED Final   Carbapenem resist OXA 48 LIKE NOT DETECTED NOT DETECTED Final   Carbapenem resistance VIM NOT DETECTED NOT DETECTED Final    Comment: Performed at [facility], [address] [place], [place] [address]  Resp Panel by RT-PCR (Flu A&B, Covid) Nasopharyngeal Swab     Status: None   Collection Time: 12/17/20 12:37 PM   Specimen: Nasopharyngeal Swab; Nasopharyngeal(NP) swabs in vial transport medium  Result Value Ref Range Status   SARS Coronavirus 2 by RT PCR NEGATIVE NEGATIVE Final    Comment: (NOTE) SARS-CoV-2 target nucleic acids are NOT DETECTED.  The SARS-CoV-2 RNA is generally detectable in upper respiratory specimens during the acute phase of infection. The lowest concentration of SARS-CoV-2 viral copies this assay can detect is 138 copies/mL. A negative result does not preclude SARS-Cov-2 infection and should not be used as the sole basis for treatment or other patient management decisions. A negative result may occur with  improper specimen collection/handling, submission of specimen other than nasopharyngeal swab, presence of viral mutation(s) within the areas targeted by this assay, and inadequate number of viral copies(<138 copies/mL). A negative result must be combined with clinical observations, patient history, and epidemiological information. The expected result is Negative.  Fact Sheet for Patients:  EntrepreneurPulse.com.au  Fact Sheet for Healthcare Providers:  IncredibleEmployment.be  This test is no t yet approved or cleared by the [place] FDA and  has been authorized for detection and/or diagnosis of SARS-CoV-2 by FDA under an Emergency Use Authorization (EUA). This EUA will remain  in effect (meaning this test can be used) for the duration of  the COVID-19 declaration under Section 564(b)(1) of the Act, 21 U.S.C.section 360bbb-3(b)(1), unless the authorization is terminated  or revoked sooner.       Influenza A by PCR NEGATIVE NEGATIVE Final   Influenza B by PCR NEGATIVE NEGATIVE Final    Comment: (NOTE) The Xpert Xpress SARS-CoV-2/FLU/RSV plus assay is intended as an aid in the diagnosis of influenza from Nasopharyngeal swab specimens and should not be used as a sole basis for treatment.  Nasal washings and aspirates are unacceptable for Xpert Xpress SARS-CoV-2/FLU/RSV testing.  Fact Sheet for Patients: EntrepreneurPulse.com.au  Fact Sheet for Healthcare Providers: IncredibleEmployment.be  This test is not yet approved or cleared by the [place] FDA and has been authorized for detection and/or diagnosis of SARS-CoV-2 by FDA under an Emergency Use Authorization (EUA). This EUA will remain in effect (meaning this test can be used) for the duration of the COVID-19 declaration under Section 564(b)(1) of the Act, 21 U.S.C. section 360bbb-3(b)(1), unless the authorization is terminated or revoked.  Performed at [facility], [address] [place], [place] [address]   Urine Culture     Status: None   Collection Time: 12/17/20  3:32 PM   Specimen: In/Out Cath Urine  Result Value Ref Range Status   Specimen Description   Final    IN/OUT CATH URINE Performed at [facility], [address] [place], [place] [address]    Special Requests   Final    NONE Performed at [facility], [address], [place], [place] [address]    Culture   Final    NO GROWTH Performed at [facility], [place] [address] [place], [place] [address]    Report Status 12/19/2020 FINAL  Final  C Difficile Quick Screen w PCR reflex     Status: None   Collection Time: 12/17/20  3:32 PM   Specimen: STOOL  Result Value Ref Range Status   C Diff antigen NEGATIVE NEGATIVE  Final   C Diff toxin NEGATIVE NEGATIVE Final   C Diff interpretation No C. difficile detected.  Final    Comment: Performed at [facility], [address] [place], [place] [address]  MRSA Next Gen by PCR, Nasal     Status: Abnormal   Collection Time: 12/18/20  9:55 PM   Specimen: Nasal Mucosa; Nasal Swab  Result Value Ref Range Status   MRSA by PCR Next Gen DETECTED (A) NOT DETECTED Final    Comment: RESULT CALLED TO, READ BACK BY AND VERIFIED WITH: BETH BUONO @ 2327 12/18/20 LFD (NOTE) The GeneXpert MRSA Assay (FDA approved for NASAL specimens only), is one component of a comprehensive MRSA colonization surveillance program. It is not intended to diagnose MRSA infection nor to guide or monitor treatment for MRSA infections. Test performance is not FDA approved in patients less than 46 years old. Performed at [facility], [address] [place], [place] [address]          Radiology Studies: US Abdomen Limited RUQ (LIVER/GB)  Result Date: 12/18/2020 CLINICAL DATA:  Evaluate for cholecystitis. EXAM: ULTRASOUND ABDOMEN LIMITED RIGHT UPPER QUADRANT COMPARISON:  CT AP from 12/17/2020 FINDINGS: Gallbladder: The gallbladder wall is diffusely edematous measuring up to 13.1 mm. Gallstones are noted measuring up to 5.9 mm. Pericholecystic fluid. Positive sonographic Murphy's sign. Common bile duct: Diameter: 6 mm Liver: Diffusely increased parenchymal echogenicity identified. No focal liver abnormality. Portal vein is patent on color Doppler imaging with normal direction of blood flow towards the liver. Other: 1.9 cm right kidney cyst. IMPRESSION: Gallstones, gallbladder wall thickening and pericholecystic fluid with positive sonographic Murphy's sign. Findings are compatible with acute cholecystitis. Electronically Signed   By: Kerby Moors M.D.   On: 12/18/2020 14:18        Scheduled Meds:  sodium chloride   Intravenous Once   sodium chloride   Intravenous Once    chlorhexidine  10 mL Mouth/Throat BID   Chlorhexidine Gluconate Cloth  6  each Topical Daily   Chlorhexidine Gluconate Cloth  6 each Topical Q0600   diltiazem  240 mg Oral Daily   dronabinol  2.5 mg Oral BID AC   feeding supplement  237 mL Oral TID BM   folic acid  1 mg Oral Daily   mupirocin ointment  1 application Nasal BID   thiamine  100 mg Oral Daily   Continuous Infusions:  sodium chloride 250 mL (12/20/20 0637)   cefTRIAXone (ROCEPHIN)  IV 2 g (12/20/20 7782)   potassium chloride 10 mEq (12/20/20 1047)     LOS: 3 days    Time spent: 32 minutes    Sharen Hones, MD [facility]   To contact the attending provider between 7A-7P or the covering provider during after hours 7P-7A, please log into the web site [email or web address] and access using universal [place] password for that web site. If you do not have the password, please call the hospital operator.  12/20/2020, 10:54 AM

## 2020-12-20 NOTE — Progress Notes (Signed)
Initial Nutrition Assessment RD working remotely.   DOCUMENTATION CODES:   Not applicable  INTERVENTION:  - continue Ensure Plus TID, each supplement provides 350 kcal and 13 grams of protein. - will order Boost Breeze TID, each supplement provides 250 kcal and 9 grams of protein. - will order 30 ml Prosource Plus BID, each supplement provides 100 kcal and 15 grams protein.  - will order 1 tablet multivitamin with minerals/day. - complete NFPE when feasible.    NUTRITION DIAGNOSIS:   Increased nutrient needs related to acute illness, cancer and cancer related treatments, chronic illness as evidenced by estimated needs.  GOAL:   Patient will meet greater than or equal to 90% of their needs  MONITOR:   Diet advancement, PO intake, Supplement acceptance, Labs, Weight trends, Skin  REASON FOR ASSESSMENT:   Malnutrition Screening Tool  ASSESSMENT:   70 y.o. 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 He presented to the ED from home d/t N/V. He met criteria for sepsis is the ED. RUQ ultrasound showed cholelithiasis with cholecystitis. Pending cholecystostomy by IR.  Diet advanced from NPO to Heart Healthy, nectar-thick liquids, 2 L fluid restriction on 9/8 at 2122 and he was again made NPO today at 0855. No meal completion percentages documented since diet advancement.   Patient is currently out of the room to IR for planned cholecystostomy.   He has not been assessed by a [place] since 2021. He was seen in person on 08/19/19 and noted to meet criteria for severe malnutrition evidenced by severe fat depletions and  severe muscle depletions.   Ensure has been ordered TID, but patient has often refused it. Will add additional  ONS for once diet re-advanced and continue to change as needed based on preferences.   Weight on 9/9 was 137 lb and weight has been stable for the past 4.5 months.    Labs reviewed; K: 3.4 mmol/l, BUN: 36 mg/dl, Ca: 7.6 mg/dl.  Medications reviewed; 2.5 mg marinol BID, 1 mg folvite/day, 20 g lactulose x1 dose 9/10, 10 mEq IV KCl x4 runs 9/10 and x3 runs 9/11, 100 mg oral thiamine/day. IVF; NS-20 mEq IV KCl @ 50 ml/hr.    NUTRITION - FOCUSED PHYSICAL EXAM:  Unable to complete at this time.  Diet Order:   Diet Order             Diet NPO time specified  Diet effective now                   EDUCATION NEEDS:   Not appropriate for education at this time  Skin:  Skin Assessment: Skin Integrity Issues: Skin Integrity Issues:: DTI DTI: R heel; L heel  Last BM:  9/9 per RN documentation  Height:   Ht Readings from Last 1 Encounters:  12/18/20 6' (1.829 m)    Weight:   Wt Readings from Last 1 Encounters:  12/18/20 62.2 kg    Estimated Nutritional Needs:  Kcal:  2250-2500 kcal Protein:  120-135 grams Fluid:  >/= 2.5 L/day      Jarome Matin, MS, RD, LDN, CNSC Inpatient Clinical Dietitian RD pager # available in [place]  After hours/weekend pager # available in [facility]

## 2020-12-20 NOTE — Progress Notes (Signed)
Interventional Radiology Progress Note  Significant improvement in platelets overnight.  Discussed with Dr. Roosevelt Locks, pt to receive one more unit of plts today before or during procedure and it would be safe to proceed with perc cholecystostomy placement at baseline risk for bleeding.    I called and spoke to his niece and POA, Monna Fam and reviewed the risks benefits and alternatives to the procedure.  She understands and gave consent to proceed.   We will now proceed with percutaneous cholecystostomy tube placement.    Signed,  Criselda Peaches, MD

## 2020-12-20 NOTE — Evaluation (Signed)
Occupational Therapy Evaluation Patient Details Name: CARSON BOGDEN MRN: [id number] DOB: 05-Jan-1951 Today's Date: 12/20/2020    History of Present Illness Pt is a 70 y.o. M arriving to ED for c/o nausea, vomiting and admitted for neutropenic sepsis,severe neutropenia, cholecystitis. PMH includes stage IV small cell carcinoma, anemia, thrombocytopenia, hx of substance abuse, chronic hepatitis C, HTN., a-fib.   Clinical Impression   Pt seen for OT evaluation this date. Upon arrival to room, pt awake with PT at bedside; OT session coordinated with PT for only mobility aspects of session d/t pt's decreased activity tolerance. At baseline, pt reports that he is mod-I w/ [facility] for household mobility and ADLs. When asked to elaborate on specific aspects of ADL performance, pt declining to elaborate. Pt reports that friends assist with IADLs. In addition to decreased activity tolerance, pt presents with decreased strength and balance. Significant differences in strength noted between R & L UE (RN informed). Due to current functional impairments, pt requires MAX A+2 for bed mobility, MAX A for seated UB/LB ADLs, and SUPERVISION/SET-UP for bed-level grooming tasks. At end of session, pt left in bed, with SpO2 91% and with RN present. Pt would benefit from additional skilled OT services to maximize return to PLOF and minimize risk of future falls, injury, caregiver burden, and readmission. Upon discharge, recommend SNF.        Follow Up Recommendations  SNF    Equipment Recommendations  Other (comment) (defer to next venue of care)       Precautions / Restrictions Precautions Precautions: Fall Precaution Comments: High fall Restrictions Weight Bearing Restrictions: No      Mobility Bed Mobility Overal bed mobility: Needs Assistance Bed Mobility: Supine to Sit;Sit to Supine     Supine to sit: +2 for physical assistance;Max assist Sit to supine: +2 for physical assistance;Max assist   General bed  mobility comments: requires full trunk and bilat LE assit, pt is able to lift head for scooting in bed but unable to lift hips or fully reach for bed rails    Transfers Overall transfer level: Needs assistance               General transfer comment: Not assessed due to pt refusal    Balance Overall balance assessment: Needs assistance Sitting-balance support: Feet supported;Bilateral upper extremity supported Sitting balance-Leahy Scale: Poor Sitting balance - Comments: Requires b/l UE support and intermittent trunk support due to posterior lean                                   ADL either performed or assessed with clinical judgement   ADL Overall ADL's : Needs assistance/impaired                                       General ADL Comments: SUPERVISION/SET-UP for bed-level grooming tasks. MAX A for UB/LB ADLs while seated EOB d/t poor sitting balance and pt requiring b/l UE support      Pertinent Vitals/Pain Pain Assessment: Faces Faces Pain Scale: Hurts a little bit Pain Location: Lower back Pain Descriptors / Indicators: Aching Pain Intervention(s): Limited activity within patient's tolerance;Monitored during session;Repositioned        Extremity/Trunk Assessment Upper Extremity Assessment Upper Extremity Assessment: Generalized weakness;RUE deficits/detail RUE Deficits / Details: Unable to flex shoulder against gravity. Elbow flexion 2+/5. Grip strength  1/5 LUE Deficits / Details: grossly 3/5 in all movements   Lower Extremity Assessment Lower Extremity Assessment: Generalized weakness;Defer to PT evaluation   Cervical / Trunk Assessment Cervical / Trunk Assessment: Kyphotic   Communication Communication Communication: No difficulties   Cognition Arousal/Alertness: Awake/alert Behavior During Therapy: Agitated;WFL for tasks assessed/performed Overall Cognitive Status: No family/caregiver present to determine baseline cognitive  functioning                                 General Comments: AOx4, however decreased sustained attention and self-limiting at times              Home Living Family/patient expects to be discharged to:: Private residence Living Arrangements: Non-relatives/Friends Available Help at Discharge: Friend(s) Type of Home: House Home Access: Level entry     Home Layout: One level     Bathroom Shower/Tub: Walk-in shower         Home Equipment: Cane - single point          Prior Functioning/Environment Level of Independence: Independent with assistive device(s)        Comments: Pt amb household distances w/ [facility]. Pt reporting being independent with ADLs, however would not elaborate on how he managed ADLs (i.e., showering vs bathing vs spongebathing at baseline). Friends assist with IADLs.        OT Problem List: Decreased strength;Decreased activity tolerance;Impaired balance (sitting and/or standing);Decreased range of motion      OT Treatment/Interventions: Self-care/ADL training;Therapeutic exercise;Energy conservation;DME and/or AE instruction;Therapeutic activities;Patient/family education;Balance training    OT Goals(Current goals can be found in the care plan section) Acute Rehab OT Goals Patient Stated Goal: to go back to bed OT Goal Formulation: With patient Time For Goal Achievement: 01/03/21 Potential to Achieve Goals: Good ADL Goals Pt Will Perform Grooming: with min assist;sitting Pt Will Perform Upper Body Dressing: with min assist;sitting Pt Will Transfer to Toilet: with min assist;with +2 assist;stand pivot transfer;bedside commode  OT Frequency: Min 1X/week    AM-PAC OT "6 Clicks" Daily Activity     Outcome Measure Help from another person eating meals?: None Help from another person taking care of personal grooming?: A Little Help from another person toileting, which includes using toliet, bedpan, or urinal?: A Lot Help from another  person bathing (including washing, rinsing, drying)?: A Lot Help from another person to put on and taking off regular upper body clothing?: A Little Help from another person to put on and taking off regular lower body clothing?: A Lot 6 Click Score: 16   End of Session Nurse Communication: Mobility status  Activity Tolerance: Patient limited by fatigue Patient left: in bed;with call bell/phone within reach;with bed alarm set;with nursing/sitter in room  OT Visit Diagnosis: Unsteadiness on feet (R26.81);Muscle weakness (generalized) (M62.81)                Time: 1696-7893 OT Time Calculation (min): 28 min Charges:  OT General Charges $OT Visit: 1 Visit OT Evaluation $OT Eval Moderate Complexity: 1 Mod OT Treatments $Self Care/Home Management : 8-22 mins  Fredirick Maudlin, OTR/L [place]

## 2020-12-20 NOTE — TOC CM/SW Note (Signed)
CSW acknowledges consult for home health/DME needs, and medication assistance. Patient has insurance so unable to send medications to [facility]. PT recommending SNF placement. Went by room but he was off the unit. Will try again later or have TOC follow up tomorrow. Chemo will be barrier to SNF placement.  Dayton Scrape, [place]

## 2020-12-21 ENCOUNTER — Encounter: Payer: Self-pay | Admitting: Internal Medicine

## 2020-12-21 ENCOUNTER — Encounter: Payer: Self-pay | Admitting: Oncology

## 2020-12-21 ENCOUNTER — Inpatient Hospital Stay: Payer: Medicare Other

## 2020-12-21 ENCOUNTER — Inpatient Hospital Stay
Admit: 2020-12-21 | Discharge: 2020-12-21 | Disposition: A | Discharge status: Home / Self Care | Payer: Medicare Other | Attending: Internal Medicine | Admitting: Internal Medicine

## 2020-12-21 DIAGNOSIS — I484 Atypical atrial flutter: Secondary | ICD-10-CM | POA: Diagnosis not present

## 2020-12-21 DIAGNOSIS — A419 Sepsis, unspecified organism: Secondary | ICD-10-CM | POA: Diagnosis not present

## 2020-12-21 DIAGNOSIS — K819 Cholecystitis, unspecified: Secondary | ICD-10-CM | POA: Diagnosis not present

## 2020-12-21 DIAGNOSIS — Z515 Encounter for palliative care: Secondary | ICD-10-CM

## 2020-12-21 DIAGNOSIS — F1010 Alcohol abuse, uncomplicated: Secondary | ICD-10-CM | POA: Diagnosis not present

## 2020-12-21 DIAGNOSIS — D696 Thrombocytopenia, unspecified: Secondary | ICD-10-CM | POA: Diagnosis not present

## 2020-12-21 DIAGNOSIS — D701 Agranulocytosis secondary to cancer chemotherapy: Secondary | ICD-10-CM | POA: Diagnosis not present

## 2020-12-21 DIAGNOSIS — I6310 Cerebral infarction due to embolism of unspecified precerebral artery: Secondary | ICD-10-CM

## 2020-12-21 DIAGNOSIS — D709 Neutropenia, unspecified: Secondary | ICD-10-CM | POA: Diagnosis not present

## 2020-12-21 LAB — BPAM PLATELET PHERESIS
Blood Product Expiration Date: 202209122359
Blood Product Expiration Date: 202209122359
Blood Product Expiration Date: 202209142359
ISSUE DATE / TIME: 202209102117
ISSUE DATE / TIME: 202209110214
ISSUE DATE / TIME: 202209111238
Unit Type and Rh: 6200
Unit Type and Rh: 6200
Unit Type and Rh: 6200

## 2020-12-21 LAB — ECHOCARDIOGRAM COMPLETE
AR max vel: 2.34 cm2
AV Area VTI: 2.3 cm2
AV Area mean vel: 2.32 cm2
AV Mean grad: 3 mmHg
AV Peak grad: 5.9 mmHg
Ao pk vel: 1.21 m/s
Area-P 1/2: 4.52 cm2
Height: 72 in
S' Lateral: 2.3 cm
Weight: 2194.02 oz

## 2020-12-21 LAB — CBC WITH DIFFERENTIAL/PLATELET
Abs Immature Granulocytes: 0.75 10*3/uL — ABNORMAL HIGH (ref 0.00–0.07)
Basophils Absolute: 0.1 10*3/uL (ref 0.0–0.1)
Basophils Relative: 1 %
Eosinophils Absolute: 0 10*3/uL (ref 0.0–0.5)
Eosinophils Relative: 0 %
HCT: 24.9 % — ABNORMAL LOW (ref 39.0–52.0)
Hemoglobin: 8.8 g/dL — ABNORMAL LOW (ref 13.0–17.0)
Immature Granulocytes: 14 %
Lymphocytes Relative: 7 %
Lymphs Abs: 0.4 10*3/uL — ABNORMAL LOW (ref 0.7–4.0)
MCH: 34.5 pg — ABNORMAL HIGH (ref 26.0–34.0)
MCHC: 35.3 g/dL (ref 30.0–36.0)
MCV: 97.6 fL (ref 80.0–100.0)
Monocytes Absolute: 0.7 10*3/uL (ref 0.1–1.0)
Monocytes Relative: 12 %
Neutro Abs: 3.5 10*3/uL (ref 1.7–7.7)
Neutrophils Relative %: 66 %
Platelets: 41 10*3/uL — ABNORMAL LOW (ref 150–400)
RBC: 2.55 MIL/uL — ABNORMAL LOW (ref 4.22–5.81)
RDW: 13.3 % (ref 11.5–15.5)
Smear Review: NORMAL
WBC: 5.3 10*3/uL (ref 4.0–10.5)
nRBC: 1.9 % — ABNORMAL HIGH (ref 0.0–0.2)

## 2020-12-21 LAB — PREPARE PLATELET PHERESIS
Unit division: 0
Unit division: 0
Unit division: 0

## 2020-12-21 LAB — BASIC METABOLIC PANEL
Anion gap: 8 (ref 5–15)
BUN: 30 mg/dL — ABNORMAL HIGH (ref 8–23)
CO2: 22 mmol/L (ref 22–32)
Calcium: 7.8 mg/dL — ABNORMAL LOW (ref 8.9–10.3)
Chloride: 111 mmol/L (ref 98–111)
Creatinine, Ser: 0.57 mg/dL — ABNORMAL LOW (ref 0.61–1.24)
GFR, Estimated: >60 mL/min (ref >60)
Glucose, Bld: 99 mg/dL (ref 70–99)
Potassium: 3.6 mmol/L (ref 3.5–5.1)
Sodium: 141 mmol/L (ref 135–145)

## 2020-12-21 LAB — MAGNESIUM: Magnesium: 2 mg/dL (ref 1.7–2.4)

## 2020-12-21 LAB — GLUCOSE, CAPILLARY
Glucose-Capillary: 73 mg/dL (ref 70–99)
Glucose-Capillary: 118 mg/dL — ABNORMAL HIGH (ref 70–99)
Glucose-Capillary: 125 mg/dL — ABNORMAL HIGH (ref 70–99)

## 2020-12-21 LAB — HEMOGLOBIN A1C
Hgb A1c MFr Bld: 5.4 % (ref 4.8–5.6)
Mean Plasma Glucose: 108.28 mg/dL

## 2020-12-21 LAB — PHOSPHORUS: Phosphorus: 1.6 mg/dL — ABNORMAL LOW (ref 2.5–4.6)

## 2020-12-21 LAB — LDL CHOLESTEROL, DIRECT: Direct LDL: 37.8 mg/dL (ref 0–99)

## 2020-12-21 MED ORDER — METRONIDAZOLE 500 MG/100ML IV SOLN
500.0000 mg | Freq: Two times a day (BID) | INTRAVENOUS | Status: DC
  Administered 2020-12-21 – 2020-12-26 (×11): 500 mg via INTRAVENOUS
  Filled 2020-12-21 (×12): qty 100

## 2020-12-21 MED ORDER — K PHOS MONO-SOD PHOS DI & MONO 155-852-130 MG PO TABS
500.0000 mg | ORAL_TABLET | ORAL | Status: AC
  Administered 2020-12-21 – 2020-12-22 (×4): 500 mg via ORAL
  Filled 2020-12-21 (×4): qty 2

## 2020-12-21 MED ORDER — SODIUM CHLORIDE 0.9% FLUSH
10.0000 mL | INTRAVENOUS | Status: DC | PRN
  Administered 2021-01-04: 10 mL

## 2020-12-21 MED ORDER — IOHEXOL 350 MG/ML SOLN
75.0000 mL | Freq: Once | INTRAVENOUS | Status: AC | PRN
  Administered 2020-12-21: 75 mL via INTRAVENOUS

## 2020-12-21 MED ORDER — SODIUM CHLORIDE 0.9% FLUSH
10.0000 mL | Freq: Two times a day (BID) | INTRAVENOUS | Status: DC
  Administered 2020-12-21 – 2020-12-24 (×7): 10 mL
  Administered 2020-12-25: 20 mL
  Administered 2020-12-25: 10 mL
  Administered 2020-12-26: 40 mL
  Administered 2020-12-26 – 2021-01-04 (×16): 10 mL

## 2020-12-21 NOTE — Progress Notes (Signed)
OT Cancellation Note  Patient Details Name: Scott Jordan MRN: [id number] DOB: May 22, 1950   Cancelled Treatment:    Reason Eval/Treat Not Completed: Fatigue/lethargy limiting ability to participate. [name] declined participation in OT this AM, saying, "I feel terrible" and that "I don't have the energy to move." He respectfully requested that an occupational therapist try working with him tomorrow or Wednesday.  Josiah Lobo, PhD, MS, OTR/L 12/21/20, 11:23 AM

## 2020-12-21 NOTE — Consult Note (Signed)
[place]  Telephone:([phone] [phone]   Name: Scott Jordan Date: 12/21/2020 MRN: [id number]  DOB: Apr 20, 1950  Patient Care Team: Earlie Server, MD as PCP - General (Oncology) Telford Nab, RN as Registered Nurse Erby Pian, MD as Referring Physician (Specialist) Teodoro Spray, MD as Consulting Physician (Cardiology) Anthonette Legato, MD as Consulting Physician (Nephrology) Vladimir Crofts, MD as Consulting Physician (Neurology) Meade Maw, MD as Consulting Physician (Neurosurgery)    REASON FOR CONSULTATION: Scott Jordan is a 70 y.o. male with multiple medical problems including stage IV small cell carcinoma on chemotherapy, protein calorie malnutrition, anemia, history of substance and alcohol abuse, HCV, history of aspiration pneumonia, who was admitted to hospital on 12/17/2020 with sepsis and Klebsiella bacteremia.  Abdominal ultrasound confirmed cholelithiasis with cholecystitis and patient underwent IR guided cholecystostomy on 9/11.  Unfortunately, his hospitalization has been complicated by acute CVA with residual right-sided weakness.  Palliative care was consulted to address goals.  SOCIAL HISTORY:     reports that he has quit smoking. His smoking use included cigarettes. He smoked an average of .5 packs per day. He has never used smokeless tobacco. He reports current alcohol use. He reports that he does not currently use drugs after having used the following drugs: Heroin.  Patient is unmarried.  He lives at home alone but had a friend who provided him care.  He has a brother and niece who are involved.  ADVANCE DIRECTIVES:  On file  CODE STATUS: Full code  PAST MEDICAL HISTORY: Past Medical History:  Diagnosis Date   A-fib ([place]) 01/10/2019   pt st this was dx by Dr. Ubaldo Glassing   Cancer [facility]) [id number]   LUNG   Complication of anesthesia    DIFFICULTY WAKING UP AFTER SURGERY- 20 YRS AGO   Hypertension     Lung cancer ([place])    Substance abuse ([place])     PAST SURGICAL HISTORY:  Past Surgical History:  Procedure Laterality Date   ARM DEBRIDEMENT Left    INCISION AND DEBRIDEMENT LOWER ARM -20 YRS AGO   BACK SURGERY     IR PERC CHOLECYSTOSTOMY  12/20/2020   PORTACATH PLACEMENT Left 08/29/2019   Procedure: INSERTION PORT-A-CATH;  Surgeon: Nestor Lewandowsky, MD;  Location: [facility] ORS;  Service: General;  Laterality: Left;    HEMATOLOGY/ONCOLOGY HISTORY:  Oncology History  Small cell lung cancer ([place])  08/23/2019 Initial Diagnosis   Small cell lung cancer ([place])   09/02/2019 -  Chemotherapy    Patient is on Treatment Plan: LUNG SCLC CARBOPLATIN + ETOPOSIDE + ATEZOLIZUMAB INDUCTION Q21D / ATEZOLIZUMAB MAINTENANCE Q21D         ALLERGIES:  has No Known Allergies.  MEDICATIONS:  Current Facility-Administered Medications  Medication Dose Route Frequency Provider Last Rate Last Admin   (feeding supplement) PROSource Plus liquid 30 mL  30 mL Oral BID BM Sharen Hones, MD   30 mL at 12/21/20 1056   0.9 %  sodium chloride infusion (Manually program via Guardrails IV Fluids)   Intravenous Once Sharen Hones, MD       0.9 %  sodium chloride infusion (Manually program via Guardrails IV Fluids)   Intravenous Once Sharen Hones, MD       0.9 %  sodium chloride infusion   Intravenous PRN Sharen Hones, MD   Stopped at 12/20/20 1551   0.9 %  sodium chloride infusion    Continuous PRN Criselda Peaches, MD 10 mL/hr at 12/20/20  1220 10 mL/hr at 12/20/20 1220   0.9 % NaCl with KCl 20 mEq/ L  infusion   Intravenous Continuous Marrion Coy, MD 50 mL/hr at 12/21/20 1344 New Bag at 12/21/20 1344   acetaminophen (TYLENOL) tablet 650 mg  650 mg Oral Q6H PRN Cox, Amy N, DO       Or   acetaminophen (TYLENOL) suppository 650 mg  650 mg Rectal Q6H PRN Cox, Amy N, DO       cefTRIAXone (ROCEPHIN) 2 g in sodium chloride 0.9 % 100 mL IVPB  2 g Intravenous Q24H Marrion Coy, MD   Stopped at 12/21/20 0509   chlorhexidine  (PERIDEX) 0.12 % solution 10 mL  10 mL Mouth/Throat BID Rickard Patience, MD   10 mL at 12/19/20 2133   Chlorhexidine Gluconate Cloth 2 % PADS 6 each  6 each Topical Daily Cox, Amy N, DO   6 each at 12/21/20 1056   diltiazem (CARDIZEM CD) 24 hr capsule 240 mg  240 mg Oral Daily Cox, Amy N, DO   240 mg at 12/21/20 1051   diltiazem (CARDIZEM) injection 10 mg  10 mg Intravenous Q2H PRN Cox, Amy N, DO       dronabinol (MARINOL) capsule 2.5 mg  2.5 mg Oral BID AC Marrion Coy, MD   2.5 mg at 12/21/20 1257   feeding supplement (BOOST / RESOURCE BREEZE) liquid 1 Container  1 Container Oral TID BM Marrion Coy, MD       feeding supplement (ENSURE ENLIVE / ENSURE PLUS) liquid 237 mL  237 mL Oral TID BM Cox, Amy N, DO   237 mL at 12/20/20 2107   fentaNYL (SUBLIMAZE) injection    PRN Sterling Big, MD   50 mcg at 12/20/20 1251   folic acid (FOLVITE) tablet 1 mg  1 mg Oral Daily Marrion Coy, MD   1 mg at 12/21/20 1051   HYDROmorphone (DILAUDID) injection    PRN Sterling Big, MD   0.5 mg at 12/20/20 1259   metroNIDAZOLE (FLAGYL) IVPB 500 mg  500 mg Intravenous Q12H Delfino Lovett, MD 100 mL/hr at 12/21/20 1349 500 mg at 12/21/20 1349   midazolam (VERSED) injection    PRN Sterling Big, MD   1 mg at 12/20/20 1250   multivitamin with minerals tablet 1 tablet  1 tablet Oral Daily Marrion Coy, MD   1 tablet at 12/21/20 1049   mupirocin ointment (BACTROBAN) 2 % 1 application  1 application Nasal BID Cox, Amy N, DO   1 application at 12/21/20 1053   ondansetron (ZOFRAN) tablet 4 mg  4 mg Oral Q6H PRN Cox, Amy N, DO       Or   ondansetron (ZOFRAN) injection 4 mg  4 mg Intravenous Q6H PRN Cox, Amy N, DO       oxyCODONE-acetaminophen (PERCOCET/ROXICET) 5-325 MG per tablet 1 tablet  1 tablet Oral Q6H PRN Mansy, Jan A, MD   1 tablet at 12/21/20 1049   phosphorus (K PHOS NEUTRAL) tablet 500 mg  500 mg Oral Q4H Shah, Vipul, MD       sodium chloride flush (NS) 0.9 % injection 5 mL  5 mL Intracatheter Q8H  Sterling Big, MD   5 mL at 12/21/20 1401   thiamine tablet 100 mg  100 mg Oral Daily Marrion Coy, MD   100 mg at 12/21/20 1051   Facility-Administered Medications Ordered in Other Encounters  Medication Dose Route Frequency Provider Last Rate Last Admin  heparin lock flush 100 unit/mL  500 Units Intravenous Once Mike Gip, Melissa C, MD       sodium chloride flush (NS) 0.9 % injection 10 mL  10 mL Intravenous PRN Nolon Stalls C, MD   10 mL at 11/11/19 0812    VITAL SIGNS: BP 114/73 (BP Location: Right Arm)   Pulse 77   Temp 98 F (36.7 C) (Oral)   Resp 18   Ht 6' (1.829 m)   Wt 137 lb 2 oz (62.2 kg)   SpO2 92%   BMI 18.60 kg/m  Filed Weights   12/17/20 1203 12/18/20 2130  Weight: 136 lb (61.7 kg) 137 lb 2 oz (62.2 kg)    Estimated body mass index is 18.6 kg/m as calculated from the following:   Height as of this encounter: 6' (1.829 m).   Weight as of this encounter: 137 lb 2 oz (62.2 kg).  LABS: CBC:    Component Value Date/Time   WBC 5.3 12/21/2020 0434   HGB 8.8 (L) 12/21/2020 0434   HCT 24.9 (L) 12/21/2020 0434   PLT 41 (L) 12/21/2020 0434   MCV 97.6 12/21/2020 0434   NEUTROABS 3.5 12/21/2020 0434   LYMPHSABS 0.4 (L) 12/21/2020 0434   MONOABS 0.7 12/21/2020 0434   EOSABS 0.0 12/21/2020 0434   BASOSABS 0.1 12/21/2020 0434   Comprehensive Metabolic Panel:    Component Value Date/Time   NA 141 12/21/2020 0434   K 3.6 12/21/2020 0434   CL 111 12/21/2020 0434   CO2 22 12/21/2020 0434   BUN 30 (H) 12/21/2020 0434   CREATININE 0.57 (L) 12/21/2020 0434   GLUCOSE 99 12/21/2020 0434   CALCIUM 7.8 (L) 12/21/2020 0434   AST 51 (H) 12/17/2020 1205   ALT 32 12/17/2020 1205   ALKPHOS 50 12/17/2020 1205   BILITOT 1.8 (H) 12/17/2020 1205   PROT 6.3 (L) 12/17/2020 1205   ALBUMIN 3.1 (L) 12/17/2020 1205    RADIOGRAPHIC STUDIES: CT ABDOMEN PELVIS WO CONTRAST  Result Date: 12/17/2020 CLINICAL DATA:  Nausea and vomiting EXAM: CT ABDOMEN AND PELVIS WITHOUT  CONTRAST TECHNIQUE: Multidetector CT imaging of the abdomen and pelvis was performed following the standard protocol without IV contrast. COMPARISON:  CT 06/23/2020, PET CT 11/19/2020 FINDINGS: Lower chest: Lung bases demonstrate emphysema. Subpleural bandlike airspace disease at the left lower lobe redemonstrated. Incompletely visualized subpleural peripheral left upper lobe nodule measuring 11 mm, series 5, image 1. Cardiomegaly with trace pericardial effusion. Coronary vascular calcification. Hepatobiliary: Extensive motion degradation. No focal hepatic abnormality. Gallstones. Gallbladder slightly distended. Possible wall thickening or pericholecystic fluid though limited by motion Pancreas: Unremarkable. No pancreatic ductal dilatation or surrounding inflammatory changes. Spleen: Normal in size without focal abnormality. Adrenals/Urinary Tract: Thickened appearance of adrenal glands. No hydronephrosis. The bladder is unremarkable Stomach/Bowel: Stomach nonenlarged. No dilated small bowel. Negative appendix. No acute bowel wall thickening Vascular/Lymphatic: Advanced aortic atherosclerosis. No aneurysm. Slightly increased multiple small retroperitoneal lymph nodes. Reproductive: Prostate is unremarkable. Other: Negative for free air or free fluid Musculoskeletal: No acute osseous abnormality. IMPRESSION: 1. Degraded by motion. 2. Gallbladder appears slightly distended and there are gallstones. Possible gallbladder wall thickening or pericholecystic fluid though limited by motion, consider correlation with ultrasound 3. Cardiomegaly with trace pericardial effusion. 4. Similar subpleural bandlike density at left lower lobe. Incompletely visualized peripheral left upper lobe lung nodule characterized on recent PET CT Electronically Signed   By: Donavan Foil M.D.   On: 12/17/2020 16:50   CT ANGIO HEAD NECK W WO CM  Result Date: 12/21/2020 CLINICAL DATA:  Neuro deficit, acute, stroke suspected. Neutropenic  sepsis. EXAM: CT ANGIOGRAPHY HEAD AND NECK TECHNIQUE: Multidetector CT imaging of the head and neck was performed using the standard protocol during bolus administration of intravenous contrast. Multiplanar CT image reconstructions and MIPs were obtained to evaluate the vascular anatomy. Carotid stenosis measurements (when applicable) are obtained utilizing NASCET criteria, using the distal internal carotid diameter as the denominator. CONTRAST:  61mL OMNIPAQUE IOHEXOL 350 MG/ML SOLN COMPARISON:  MRI of the brain December 20, 2020. FINDINGS: CT HEAD FINDINGS Brain: Confluent hypodensity of the white matter of the cerebral hemispheres, nonspecific, most likely related to chronic small vessel ischemia. Foci of acute infarct are more conspicuous on recent MRI of the brain. No new large territorial infarct. No hemorrhage, hydrocephalus, mass or midline shift. Vascular: No hyperdense vessel or unexpected calcification. Skull: Normal. Negative for fracture or focal lesion. Sinuses: Increased thickness of the walls of the bilateral maxillary sinuses with bubbly secretion on the left. Findings are suggestive of chronic sinusitis. Right mastoid effusion. Orbits: No acute finding. Review of the MIP images confirms the above findings CTA NECK FINDINGS Aortic arch: Standard branching. Imaged portion shows no evidence of aneurysm or dissection. Mild atherosclerotic changes of the aortic arch without significant stenosis of the major arch vessel origins. Right carotid system: Calcified atherosclerotic disease of the right carotid bifurcation without hemodynamically significant stenosis. Left carotid system: Atherosclerotic disease of the left carotid bifurcation with mixed density plaques without hemodynamically significant stenosis. Vertebral arteries: Calcified atherosclerotic plaque at the origin of the right vertebral artery without hemodynamically significant stenosis. Mild atherosclerotic changes at the origin of the left  vertebral artery without hemodynamically significant stenosis. Cervical vertebral arteries otherwise have normal caliber Skeleton: Degenerative changes of the cervical spine with fusion of the facet joints at C2-3 on the left. No acute findings. Other neck: Negative. Upper chest: Left-sided pleural effusion and atelectasis. Mediastinal lymphadenopathy. Review of the MIP images confirms the above findings CTA HEAD FINDINGS Anterior circulation: Calcified atherosclerotic plaques in the bilateral carotid siphons. No significant stenosis, proximal occlusion, aneurysm, or vascular malformation. Posterior circulation: No significant stenosis, proximal occlusion, aneurysm, or vascular malformation. Hypoplastic/aplastic right P1/PCA segment with right fetal PCA. Venous sinuses: As permitted by contrast timing, patent. Anatomic variants: Right fetal PCA. Review of the MIP images confirms the above findings IMPRESSION: 1. Advanced chronic microvascular ischemic changes of the white matter. 2. Atherosclerotic disease of the bilateral carotid bifurcation without hemodynamically significant stenosis. 3. Mild atherosclerotic changes at the origin of the bilateral vertebral arteries without hemodynamically significant stenosis. 4. Mild intracranial atherosclerotic disease without hemodynamically significant stenosis. Electronically Signed   By: Pedro Earls M.D.   On: 12/21/2020 15:32   CT HEAD WO CONTRAST (5MM)  Result Date: 12/17/2020 CLINICAL DATA:  Nausea vomiting EXAM: CT HEAD WITHOUT CONTRAST TECHNIQUE: Contiguous axial images were obtained from the base of the skull through the vertex without intravenous contrast. COMPARISON:  CT brain 06/24/2020, MRI 03/10/2020, head CT 11/22/2019 FINDINGS: Brain: No acute territorial infarction, hemorrhage or intracranial mass. Extensive white matter disease, progressed from more remote exams from 2021. Stable ventricle size. Chronic lacunar infarcts within the left  white matter. Vascular: No hyperdense vessel.  Carotid vascular calcification Skull: Normal. Negative for fracture or focal lesion. Sinuses/Orbits: Mucosal thickening the sinuses. Chronic appearing nasal deformity Other: None IMPRESSION: 1. No definite CT evidence for acute intracranial abnormality. 2. Atrophy. Extensive white matter disease, grossly stable as compared with recent head CT  from March, appears progressive when compared to exams from 2021 and prior Electronically Signed   By: Donavan Foil M.D.   On: 12/17/2020 16:39   MR BRAIN W WO CONTRAST  Addendum Date: 12/21/2020   ADDENDUM REPORT: 12/21/2020 14:49 ADDENDUM: Patient returns for repeat postcontrast imaging. There is no abnormal enhancement. No evidence of intracranial metastatic disease. Electronically Signed   By: Macy Mis M.D.   On: 12/21/2020 14:49   Result Date: 12/21/2020 CLINICAL DATA:  Neuro deficit, acute, stroke suspected Small cell lung cancer, monitor EXAM: MRI HEAD WITHOUT AND WITH CONTRAST TECHNIQUE: Multiplanar, multiecho pulse sequences of the brain and surrounding structures were obtained without and with intravenous contrast. CONTRAST:  7.61mL GADAVIST GADOBUTROL 1 MMOL/ML IV SOLN COMPARISON:  CT head 12/17/2020. FINDINGS: Mildly motion limited exam.  Within this limitation: Brain: Multiple small areas of restricted diffusion in the bilateral frontal and occipital, and left parietal matter, left perirolandic cortex, left thalamocapsular region, and left cerebellum. Advanced confluent T2 hyperintensity within the white matter, nonspecific but compatible with chronic microvascular ischemic disease and progressed since 2021. Small remote infarct in the left corona radiata. No hydrocephalus, obvious mass lesion, midline shift, acute hemorrhage, or extra-axial fluid collection. Punctate foci of susceptibility artifact in the left cerebellum and left frontal white matter, nonspecific but potentially related to prior  microhemorrhage. Contrast was administered; however, there is no evidence of enhancement (for example, the nasal mucosa/turbinates are nonenhancing). This precludes evaluation for enhancing lesions. Vascular: Major arterial flow voids are maintained at the skull base. Skull and upper cervical spine: Normal marrow signal. Sinuses/Orbits: Mild paranasal sinus disease. Other: Moderate right and small left mastoid effusions. IMPRESSION: 1. Multiple small areas of restricted diffusion in the bilateral frontal and occipital and left parietal white matter, left perirolandic cortex, left thalamocapsular region, and left cerebellum, which are concerning for acute infarcts. Given involvement of multiple vascular territories, consider a central embolic etiology. 2. Contrast was administered; however, there is no evidence of enhancement, unclear why given reported uneventful injection per the technologist. This precludes evaluation for enhancing lesions. Given the patient's known small cell lung cancer diagnosis, recommend repeat postcontrast imaging to exclude metastatic disease. I've asked the MRI technologist to not bill the patient for the repeat post-contrast imaging. 3. Advanced chronic microvascular ischemic disease, progressed since 2021. 4. Moderate right and small left mastoid effusions. Electronically Signed: By: Margaretha Sheffield M.D. On: 12/20/2020 14:29   IR Perc Cholecystostomy  Result Date: 12/20/2020 INDICATION: 70 year old male with small cell lung cancer and chemotherapy induced pancytopenia presenting with neutropenic sepsis. Blood cultures grew out Klebsiella and imaging findings are concerning for acute cholecystitis. He is too sick and frail to be considered an operative candidate. Following transfusion of platelets, his platelets are now at an acceptable level to proceed with percutaneous cholecystostomy tube placement. EXAM: CHOLECYSTOSTOMY MEDICATIONS: In patient currently receiving intravenous  antibiotic therapy. No additional antibiotic prophylaxis administered. ANESTHESIA/SEDATION: Moderate (conscious) sedation was employed during this procedure. A total of Versed 1 mg and Fentanyl 50 mcg and 0.5 mg Dilaudid administered intravenously. Moderate Sedation Time: 13 minutes. The patient's level of consciousness and vital signs were monitored continuously by radiology nursing throughout the procedure under my direct supervision. FLUOROSCOPY TIME:  Fluoroscopy Time: 0 minutes 30 seconds (4.3 mGy). COMPLICATIONS: None immediate. PROCEDURE: Informed written consent was obtained from the patient after a thorough discussion of the procedural risks, benefits and alternatives. All questions were addressed. Maximal Sterile Barrier Technique was utilized including caps, mask, sterile gowns,  sterile gloves, sterile drape, hand hygiene and skin antiseptic. A timeout was performed prior to the initiation of the procedure. The right upper quadrant was interrogated with ultrasound. The distended gallbladder with a thickened wall was successfully identified. There is significant excursion of the liver with respiration due to use of abdominal muscles. A suitable skin entry site was selected. Local anesthesia was attained by infiltration with 1% lidocaine. A small dermatotomy was made. Under real-time ultrasound guidance, the movement of the liver was timed with respirations and in a single pass, a 21 gauge Accustick needle was advanced through the liver and into the gallbladder lumen along a short transhepatic course. A 0.018 wire was quickly advanced into the gallbladder lumen and the needle was exchanged for the transitional dilator. The transitional dilator was advanced into the gallbladder lumen. The wire was removed. Contrast injection was performed under fluoroscopy confirming opacification of the gallbladder lumen. Multiple filling defects consistent with cholelithiasis. The cystic duct is occluded. A 0.035 wire was  then coiled within the gallbladder lumen. The transitional dilator was removed. The transhepatic tract was dilated to 10 [place] with a stiff dilator. A Cook 10.2 [place] all-purpose drainage catheter was then advanced over the wire and formed in the gallbladder lumen. Images were obtained and stored for the medical record. The catheter was gently flushed and connected to gravity bag drainage before being secured to the skin with 0 silk suture. IMPRESSION: Successful placement of 10 French transhepatic percutaneous cholecystostomy tube for an indication of calculus cholecystitis. PLAN: Maintain tube to gravity bag drainage. Patient will likely need home health to assist with care and cleaning of the tube site. Return to interventional radiology approximately every 8 weeks for cholecystostomy tube check and exchange. If patient's clinical status improves in the future, elective cholecystectomy would be optimal. If patient remains a poor operative candidate, long-term cholecystostomy tube maintenance will likely be required. Electronically Signed   By: Jacqulynn Cadet M.D.   On: 12/20/2020 13:50   DG Chest Port 1 View  Result Date: 12/17/2020 CLINICAL DATA:  Questionable sepsis.  Weakness EXAM: PORTABLE CHEST 1 VIEW COMPARISON:  03/09/2020 FINDINGS: Left subclavian approached chest port remains in place. Stable cardiomegaly. Chronic left basilar scarring or atelectasis. Peripheral left upper lobe nodule better seen on prior CT. No new airspace consolidation. Pleural effusion or pneumothorax. IMPRESSION: Chronic left basilar scarring or atelectasis. No new or acute findings. Electronically Signed   By: Davina Poke D.O.   On: 12/17/2020 13:20   ECHOCARDIOGRAM COMPLETE  Result Date: 12/21/2020    ECHOCARDIOGRAM REPORT   Patient Name:   Scott Jordan Date of Exam: 12/21/2020 Medical Rec #:  [id number]    Height:       72.0 in Accession #:    [id number]   Weight:       137.1 lb Date of Birth:  1950-12-08     BSA:           1.815 m Patient Age:    56 years     BP:           114/80 mmHg Patient Gender: M            HR:           89 bpm. Exam Location:  [facility] Procedure: 2D Echo, Color Doppler and Cardiac Doppler Indications:     Endocarditis I38  History:         Patient has prior history of Echocardiogram examinations, most  recent 08/19/2019. Arrythmias:Atrial Fibrillation; Risk                  Factors:Hypertension. Substance abuse.  Sonographer:     Sherrie Sport Referring Phys:  [place] Diagnosing Phys: Serafina Royals MD IMPRESSIONS  1. Left ventricular ejection fraction, by estimation, is 60 to 65%. The left ventricle has normal function. The left ventricle has no regional wall motion abnormalities. Left ventricular diastolic parameters were normal.  2. Right ventricular systolic function is normal. The right ventricular size is normal.  3. The mitral valve is normal in structure. Trivial mitral valve regurgitation.  4. The aortic valve is normal in structure. Aortic valve regurgitation is not visualized. Mild aortic valve sclerosis is present, with no evidence of aortic valve stenosis. Conclusion(s)/Recommendation(s): No evidence of valve vegetations. FINDINGS  Left Ventricle: Left ventricular ejection fraction, by estimation, is 60 to 65%. The left ventricle has normal function. The left ventricle has no regional wall motion abnormalities. The left ventricular internal cavity size was small. There is no left ventricular hypertrophy. Left ventricular diastolic parameters were normal. Right Ventricle: The right ventricular size is normal. No increase in right ventricular wall thickness. Right ventricular systolic function is normal. Left Atrium: Left atrial size was normal in size. Right Atrium: Right atrial size was normal in size. Pericardium: There is no evidence of pericardial effusion. Mitral Valve: The mitral valve is normal in structure. Trivial mitral valve regurgitation. Tricuspid Valve: The  tricuspid valve is normal in structure. Tricuspid valve regurgitation is trivial. Aortic Valve: The aortic valve is normal in structure. Aortic valve regurgitation is not visualized. Mild aortic valve sclerosis is present, with no evidence of aortic valve stenosis. Aortic valve mean gradient measures 3.0 mmHg. Aortic valve peak gradient measures 5.9 mmHg. Aortic valve area, by VTI measures 2.30 cm. Pulmonic Valve: The pulmonic valve was normal in structure. Pulmonic valve regurgitation is not visualized. Aorta: The aortic root and ascending aorta are structurally normal, with no evidence of dilitation. IAS/Shunts: No atrial level shunt detected by color flow Doppler.  LEFT VENTRICLE PLAX 2D LVIDd:         3.87 cm LVIDs:         2.30 cm LV PW:         1.00 cm LV IVS:        1.18 cm LVOT diam:     2.10 cm LV SV:         44 LV SV Index:   24 LVOT Area:     3.46 cm  RIGHT VENTRICLE RV Basal diam:  3.70 cm RV S prime:     12.40 cm/s TAPSE (M-mode): 3.7 cm LEFT ATRIUM           Index       RIGHT ATRIUM           Index LA diam:      3.50 cm 1.93 cm/m  RA Area:     33.70 cm LA Vol (A2C): 57.4 ml 31.63 ml/m RA Volume:   128.00 ml 70.53 ml/m LA Vol (A4C): 72.1 ml 39.73 ml/m  AORTIC VALVE                   PULMONIC VALVE AV Area (Vmax):    2.34 cm    PV Vmax:        0.60 m/s AV Area (Vmean):   2.32 cm    PV Peak grad:   1.4 mmHg AV Area (VTI):  2.30 cm    RVOT Peak grad: 2 mmHg AV Vmax:           121.00 cm/s AV Vmean:          86.000 cm/s AV VTI:            0.193 m AV Peak Grad:      5.9 mmHg AV Mean Grad:      3.0 mmHg LVOT Vmax:         81.60 cm/s LVOT Vmean:        57.600 cm/s LVOT VTI:          0.128 m LVOT/AV VTI ratio: 0.66  AORTA Ao Root diam: 3.50 cm MITRAL VALVE               TRICUSPID VALVE MV Area (PHT): 4.52 cm    TR Peak grad:   24.4 mmHg MV Decel Time: 168 msec    TR Vmax:        247.00 cm/s MV E velocity: 88.30 cm/s                            SHUNTS                            Systemic VTI:  0.13 m                             Systemic Diam: 2.10 cm Serafina Royals MD Electronically signed by Serafina Royals MD Signature Date/Time: 12/21/2020/5:20:09 PM    Final    US Abdomen Limited RUQ (LIVER/GB)  Result Date: 12/18/2020 CLINICAL DATA:  Evaluate for cholecystitis. EXAM: ULTRASOUND ABDOMEN LIMITED RIGHT UPPER QUADRANT COMPARISON:  CT AP from 12/17/2020 FINDINGS: Gallbladder: The gallbladder wall is diffusely edematous measuring up to 13.1 mm. Gallstones are noted measuring up to 5.9 mm. Pericholecystic fluid. Positive sonographic Murphy's sign. Common bile duct: Diameter: 6 mm Liver: Diffusely increased parenchymal echogenicity identified. No focal liver abnormality. Portal vein is patent on color Doppler imaging with normal direction of blood flow towards the liver. Other: 1.9 cm right kidney cyst. IMPRESSION: Gallstones, gallbladder wall thickening and pericholecystic fluid with positive sonographic Murphy's sign. Findings are compatible with acute cholecystitis. Electronically Signed   By: Kerby Moors M.D.   On: 12/18/2020 14:18    PERFORMANCE STATUS (ECOG) : 4 - Bedbound  Review of Systems Unless otherwise noted, a complete review of systems is negative.  Physical Exam General: NAD Pulmonary: Unlabored Extremities: no edema, no joint deformities Skin: no rashes Neurological: Right-sided weakness  IMPRESSION: I met with patient to discuss goals.  Introduced palliative care services and attempted establish therapeutic rapport.  Patient said that he was not really interested in talking and just wanted to rest.  However, he did tell me that he felt like he would get better in both the cancer and stroke.  I asked him to consider the possibility of future decline and he said if that happens he will "just die."  Patient says that he has advance directives.  He has appointed his niece to be his healthcare power of attorney and he asked that I call her.  Asked patient to consider CODE STATUS.   Initially, he said that he did not think he would want to be resuscitated and would rather just be "left in peace."  However, he said he was not  fully prepared to make that decision at the present time.  I called and spoke with his niece.  She says that she has discussed with the hospitalist and oncologist today regarding patient's status.  She says that she plans to have a family meeting with patient and his brother later this week to discuss goals.  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  She also plans to discuss CODE STATUS with patient.  PLAN: -Continue current scope of treatment -Family planning meeting to discuss goals -Will follow    Time Total: 60 minutes  Visit consisted of counseling and education dealing with the complex and emotionally intense issues of symptom management and palliative care in the setting of serious and potentially life-threatening illness.Greater than 50%  of this time was spent counseling and coordinating care related to the above assessment and plan.  Signed by: Altha Harm, PhD, NP-C

## 2020-12-21 NOTE — Care Management Important Message (Signed)
Important Message  Patient Details  Name: THEADORE BLUNCK MRN: [id number] Date of Birth: [birth year]   Medicare Important Message Given:  Yes     Dannette Barbara 12/21/2020, 1:03 PM

## 2020-12-21 NOTE — Consult Note (Signed)
[facility] Consultation Note  Patient ID: Scott Jordan, MRN: [id number], DOB/AGE: 1950-08-25 70 y.o. Admit date: 12/17/2020   Date of Consult: 12/21/2020 Primary Physician: Earlie Server, MD Primary Cardiologist: None  Chief Complaint:  Chief Complaint  Patient presents with  . Emesis   Reason for Consult:  Atrial fibrillation and bacteremia with stroke  HPI: 70 y.o. male with known small cell lung cancer with chemotherapy and immunocompromised for which the patient has now had a Klebsiella pneumonia and bacteremia.  In addition to that the patient has had recent significant illness with cholecystitis now after placing a gallbladder tube which has relieved some of his symptoms.  He still has cough and congestion with Klebsiella pneumonia and bacteremia for which antibiotics have made him feel somewhat better.  Additionally he has had strokelike symptoms and has had a brain MRI showing multiple showering of probable emboli.  The question is emboli from infection versus emboli from atrial fibrillation.  The atrial fibrillation has had reasonable heart rate control without needing additional medication management other than current oral medication management of Cardizem CD and the patient cannot be anticoagulated due to a hemoglobin of 8.8 and platelets of 44.  There has been no overt bleeding complications but there is significant concerns that the patient will have significant complication especially with recent gallbladder tube placement.  EKG currently has shown atrial fibrillation with controlled ventricular rate left axis deviation and poor R wave progression.  The patient feels fine with no evidence of chest pain or congestive heart failure but has had some neurologic abnormalities consistent with multiple stroke.  There has been concerns of need for transesophageal echocardiogram for further assessment for the possibility of stroke from embolic phenomenon although currently without the  ability for anticoagulation would not change to current course.  In addition to that the patient has had an echocardiogram showing normal LV systolic function and no evidence of significant valvular heart disease abnormalities and no evidence of large vegetation.  Therefore risk-benefit ratio of transesophageal echocardiogram is in slight question at this time unless further definition of how it will alter the current course of the patient's condition.  Patient is hemodynamically stable at this time  Past Medical History:  Diagnosis Date  . A-fib ([place]) 01/10/2019   pt st this was dx by Dr. Ubaldo Glassing  . Cancer ([place]) 2021   LUNG  . Complication of anesthesia    DIFFICULTY WAKING UP AFTER SURGERY- 20 YRS AGO  . Hypertension   . Lung cancer ([place])   . Substance abuse [facility])       Surgical History:  Past Surgical History:  Procedure Laterality Date  . ARM DEBRIDEMENT Left    INCISION AND DEBRIDEMENT LOWER ARM -20 YRS AGO  . BACK SURGERY    . IR PERC CHOLECYSTOSTOMY  12/20/2020  . PORTACATH PLACEMENT Left 08/29/2019   Procedure: INSERTION PORT-A-CATH;  Surgeon: Nestor Lewandowsky, MD;  Location: [facility] ORS;  Service: General;  Laterality: Left;     Home Meds: Prior to Admission medications   Medication Sig Start Date End Date Taking? Authorizing Provider  diltiazem (TIAZAC) 240 MG 24 hr capsule Take 1 capsule by mouth daily. 07/24/19  Yes [provider]  ELIQUIS 5 MG TABS tablet Take 5 mg by mouth 2 (two) times daily. 08/30/19  Yes [provider]  magnesium oxide (MAG-OX) 400 MG tablet Take by mouth.   Yes [provider]  potassium chloride SA (KLOR-CON) 20 MEQ tablet TAKE 1 TABLET  BY MOUTH DAILY FOR 3 DAYS 12/03/19  Yes Lequita Asal, MD  prochlorperazine (COMPAZINE) 10 MG tablet Take 1 tablet (10 mg total) by mouth every 6 (six) hours as needed for nausea or vomiting. 12/08/20  Yes Earlie Server, MD  feeding supplement, ENSURE ENLIVE, (ENSURE ENLIVE) LIQD Take 237 mLs by  mouth 3 (three) times daily between meals. 08/23/19   Enzo Bi, MD  lidocaine-prilocaine (EMLA) cream Apply to affected area once Patient not taking: No sig reported 08/26/19   Lequita Asal, MD  MAVYRET 100-40 MG TABS Take 3 tablets by mouth daily. Patient not taking: No sig reported 08/18/20   [provider]  mirtazapine (REMERON) 15 MG tablet Take by mouth. Patient not taking: No sig reported 06/26/20 06/26/21  [provider]  naloxone [facility]) nasal spray 4 mg/0.1 mL once as needed Patient not taking: No sig reported 01/13/19   [provider]  ondansetron (ZOFRAN) 8 MG tablet Take 1 tablet (8 mg total) by mouth every 8 (eight) hours as needed for refractory nausea / vomiting. Start on day 3 after carboplatin chemo. Patient not taking: No sig reported 08/26/19   Lequita Asal, MD    Inpatient Medications:  . (feeding supplement) PROSource Plus  30 mL Oral BID BM  . sodium chloride   Intravenous Once  . sodium chloride   Intravenous Once  . chlorhexidine  10 mL Mouth/Throat BID  . Chlorhexidine Gluconate Cloth  6 each Topical Daily  . diltiazem  240 mg Oral Daily  . dronabinol  2.5 mg Oral BID AC  . feeding supplement  1 Container Oral TID BM  . feeding supplement  237 mL Oral TID BM  . folic acid  1 mg Oral Daily  . multivitamin with minerals  1 tablet Oral Daily  . mupirocin ointment  1 application Nasal BID  . phosphorus  500 mg Oral Q4H  . sodium chloride flush  5 mL Intracatheter Q8H  . thiamine  100 mg Oral Daily   . sodium chloride Stopped (12/20/20 1551)  . sodium chloride 10 mL/hr (12/20/20 1220)  . 0.9 % NaCl with KCl 20 mEq / L 50 mL/hr at 12/21/20 1344  . cefTRIAXone (ROCEPHIN)  IV Stopped (12/21/20 0509)  . metronidazole 500 mg (12/21/20 1349)    Allergies: No Known Allergies  Social History   Socioeconomic History  . Marital status: Single    Spouse name: Not on file  . Number of children: Not on file  . Years of  education: Not on file  . Highest education level: Not on file  Occupational History  . Not on file  Tobacco Use  . Smoking status: Former    Packs/day: 0.50    Types: Cigarettes  . Smokeless tobacco: Never  . Tobacco comments:    not smoked since 6 weeks ago  Vaping Use  . Vaping Use: Never used  Substance and Sexual Activity  . Alcohol use: Yes    Comment: 84 OZ IN WEEK  . Drug use: Not Currently    Types: Heroin    Comment: heroin WITHIN PAST YEAR  . Sexual activity: Not on file  Other Topics Concern  . Not on file  Social History Narrative  . Not on file   Social Determinants of Health   Financial Resource Strain: Not on file  Food Insecurity: Not on file  Transportation Needs: Not on file  Physical Activity: Not on file  Stress: Not on file  Social Connections:  Not on file  Intimate Partner Violence: Not on file     History reviewed. No pertinent family history.   Review of Systems Positive for weakness shortness of breath cough congestion Negative for: General:  chills, fever, night sweats or weight changes.  Cardiovascular: PND orthopnea syncope dizziness  Dermatological skin lesions rashes Respiratory: Positive for cough congestion Urologic: Frequent urination urination at night and hematuria Abdominal: negative for nausea, vomiting, diarrhea, bright red blood per rectum, melena, or hematemesis Neurologic: negative for visual changes, and/or hearing changes  All other systems reviewed and are otherwise negative except as noted above.  Labs: No results for input(s): CKTOTAL, CKMB, TROPONINI in the last 72 hours. Lab Results  Component Value Date   WBC 5.3 12/21/2020   HGB 8.8 (L) 12/21/2020   HCT 24.9 (L) 12/21/2020   MCV 97.6 12/21/2020   PLT 41 (L) 12/21/2020    Recent Labs  Lab 12/17/20 1205 12/18/20 0500 12/21/20 0434  NA 136   < > 141  K 3.3*   < > 3.6  CL 101   < > 111  CO2 22   < > 22  BUN 31*   < > 30*  CREATININE 1.45*   < > 0.57*   CALCIUM 8.0*   < > 7.8*  PROT 6.3*  --   --   BILITOT 1.8*  --   --   ALKPHOS 50  --   --   ALT 32  --   --   AST 51*  --   --   GLUCOSE 209*   < > 99   < > = values in this interval not displayed.   No results found for: CHOL, HDL, LDLCALC, TRIG No results found for: DDIMER  Radiology/Studies:  CT ABDOMEN PELVIS WO CONTRAST  Result Date: 12/17/2020 CLINICAL DATA:  Nausea and vomiting EXAM: CT ABDOMEN AND PELVIS WITHOUT CONTRAST TECHNIQUE: Multidetector CT imaging of the abdomen and pelvis was performed following the standard protocol without IV contrast. COMPARISON:  CT 06/23/2020, PET CT 11/19/2020 FINDINGS: Lower chest: Lung bases demonstrate emphysema. Subpleural bandlike airspace disease at the left lower lobe redemonstrated. Incompletely visualized subpleural peripheral left upper lobe nodule measuring 11 mm, series 5, image 1. Cardiomegaly with trace pericardial effusion. Coronary vascular calcification. Hepatobiliary: Extensive motion degradation. No focal hepatic abnormality. Gallstones. Gallbladder slightly distended. Possible wall thickening or pericholecystic fluid though limited by motion Pancreas: Unremarkable. No pancreatic ductal dilatation or surrounding inflammatory changes. Spleen: Normal in size without focal abnormality. Adrenals/Urinary Tract: Thickened appearance of adrenal glands. No hydronephrosis. The bladder is unremarkable Stomach/Bowel: Stomach nonenlarged. No dilated small bowel. Negative appendix. No acute bowel wall thickening Vascular/Lymphatic: Advanced aortic atherosclerosis. No aneurysm. Slightly increased multiple small retroperitoneal lymph nodes. Reproductive: Prostate is unremarkable. Other: Negative for free air or free fluid Musculoskeletal: No acute osseous abnormality. IMPRESSION: 1. Degraded by motion. 2. Gallbladder appears slightly distended and there are gallstones. Possible gallbladder wall thickening or pericholecystic fluid though limited by motion,  consider correlation with ultrasound 3. Cardiomegaly with trace pericardial effusion. 4. Similar subpleural bandlike density at left lower lobe. Incompletely visualized peripheral left upper lobe lung nodule characterized on recent PET CT Electronically Signed   By: Donavan Foil M.D.   On: 12/17/2020 16:50   CT ANGIO HEAD NECK W WO CM  Result Date: 12/21/2020 CLINICAL DATA:  Neuro deficit, acute, stroke suspected. Neutropenic sepsis. EXAM: CT ANGIOGRAPHY HEAD AND NECK TECHNIQUE: Multidetector CT imaging of the head and neck was performed using the  standard protocol during bolus administration of intravenous contrast. Multiplanar CT image reconstructions and MIPs were obtained to evaluate the vascular anatomy. Carotid stenosis measurements (when applicable) are obtained utilizing NASCET criteria, using the distal internal carotid diameter as the denominator. CONTRAST:  19mL OMNIPAQUE IOHEXOL 350 MG/ML SOLN COMPARISON:  MRI of the brain December 20, 2020. FINDINGS: CT HEAD FINDINGS Brain: Confluent hypodensity of the white matter of the cerebral hemispheres, nonspecific, most likely related to chronic small vessel ischemia. Foci of acute infarct are more conspicuous on recent MRI of the brain. No new large territorial infarct. No hemorrhage, hydrocephalus, mass or midline shift. Vascular: No hyperdense vessel or unexpected calcification. Skull: Normal. Negative for fracture or focal lesion. Sinuses: Increased thickness of the walls of the bilateral maxillary sinuses with bubbly secretion on the left. Findings are suggestive of chronic sinusitis. Right mastoid effusion. Orbits: No acute finding. Review of the MIP images confirms the above findings CTA NECK FINDINGS Aortic arch: Standard branching. Imaged portion shows no evidence of aneurysm or dissection. Mild atherosclerotic changes of the aortic arch without significant stenosis of the major arch vessel origins. Right carotid system: Calcified atherosclerotic  disease of the right carotid bifurcation without hemodynamically significant stenosis. Left carotid system: Atherosclerotic disease of the left carotid bifurcation with mixed density plaques without hemodynamically significant stenosis. Vertebral arteries: Calcified atherosclerotic plaque at the origin of the right vertebral artery without hemodynamically significant stenosis. Mild atherosclerotic changes at the origin of the left vertebral artery without hemodynamically significant stenosis. Cervical vertebral arteries otherwise have normal caliber Skeleton: Degenerative changes of the cervical spine with fusion of the facet joints at C2-3 on the left. No acute findings. Other neck: Negative. Upper chest: Left-sided pleural effusion and atelectasis. Mediastinal lymphadenopathy. Review of the MIP images confirms the above findings CTA HEAD FINDINGS Anterior circulation: Calcified atherosclerotic plaques in the bilateral carotid siphons. No significant stenosis, proximal occlusion, aneurysm, or vascular malformation. Posterior circulation: No significant stenosis, proximal occlusion, aneurysm, or vascular malformation. Hypoplastic/aplastic right P1/PCA segment with right fetal PCA. Venous sinuses: As permitted by contrast timing, patent. Anatomic variants: Right fetal PCA. Review of the MIP images confirms the above findings IMPRESSION: 1. Advanced chronic microvascular ischemic changes of the white matter. 2. Atherosclerotic disease of the bilateral carotid bifurcation without hemodynamically significant stenosis. 3. Mild atherosclerotic changes at the origin of the bilateral vertebral arteries without hemodynamically significant stenosis. 4. Mild intracranial atherosclerotic disease without hemodynamically significant stenosis. Electronically Signed   By: Pedro Earls M.D.   On: 12/21/2020 15:32   CT HEAD WO CONTRAST (5MM)  Result Date: 12/17/2020 CLINICAL DATA:  Nausea vomiting EXAM: CT HEAD  WITHOUT CONTRAST TECHNIQUE: Contiguous axial images were obtained from the base of the skull through the vertex without intravenous contrast. COMPARISON:  CT brain 06/24/2020, MRI 03/10/2020, head CT 11/22/2019 FINDINGS: Brain: No acute territorial infarction, hemorrhage or intracranial mass. Extensive white matter disease, progressed from more remote exams from 2021. Stable ventricle size. Chronic lacunar infarcts within the left white matter. Vascular: No hyperdense vessel.  Carotid vascular calcification Skull: Normal. Negative for fracture or focal lesion. Sinuses/Orbits: Mucosal thickening the sinuses. Chronic appearing nasal deformity Other: None IMPRESSION: 1. No definite CT evidence for acute intracranial abnormality. 2. Atrophy. Extensive white matter disease, grossly stable as compared with recent head CT from March, appears progressive when compared to exams from 2021 and prior Electronically Signed   By: Donavan Foil M.D.   On: 12/17/2020 16:39   MR BRAIN W WO CONTRAST  Addendum Date: 12/21/2020   ADDENDUM REPORT: 12/21/2020 14:49 ADDENDUM: Patient returns for repeat postcontrast imaging. There is no abnormal enhancement. No evidence of intracranial metastatic disease. Electronically Signed   By: Macy Mis M.D.   On: 12/21/2020 14:49   Result Date: 12/21/2020 CLINICAL DATA:  Neuro deficit, acute, stroke suspected Small cell lung cancer, monitor EXAM: MRI HEAD WITHOUT AND WITH CONTRAST TECHNIQUE: Multiplanar, multiecho pulse sequences of the brain and surrounding structures were obtained without and with intravenous contrast. CONTRAST:  7.55mL GADAVIST GADOBUTROL 1 MMOL/ML IV SOLN COMPARISON:  CT head 12/17/2020. FINDINGS: Mildly motion limited exam.  Within this limitation: Brain: Multiple small areas of restricted diffusion in the bilateral frontal and occipital, and left parietal matter, left perirolandic cortex, left thalamocapsular region, and left cerebellum. Advanced confluent T2  hyperintensity within the white matter, nonspecific but compatible with chronic microvascular ischemic disease and progressed since 2021. Small remote infarct in the left corona radiata. No hydrocephalus, obvious mass lesion, midline shift, acute hemorrhage, or extra-axial fluid collection. Punctate foci of susceptibility artifact in the left cerebellum and left frontal white matter, nonspecific but potentially related to prior microhemorrhage. Contrast was administered; however, there is no evidence of enhancement (for example, the nasal mucosa/turbinates are nonenhancing). This precludes evaluation for enhancing lesions. Vascular: Major arterial flow voids are maintained at the skull base. Skull and upper cervical spine: Normal marrow signal. Sinuses/Orbits: Mild paranasal sinus disease. Other: Moderate right and small left mastoid effusions. IMPRESSION: 1. Multiple small areas of restricted diffusion in the bilateral frontal and occipital and left parietal white matter, left perirolandic cortex, left thalamocapsular region, and left cerebellum, which are concerning for acute infarcts. Given involvement of multiple vascular territories, consider a central embolic etiology. 2. Contrast was administered; however, there is no evidence of enhancement, unclear why given reported uneventful injection per the technologist. This precludes evaluation for enhancing lesions. Given the patient's known small cell lung cancer diagnosis, recommend repeat postcontrast imaging to exclude metastatic disease. I've asked the MRI technologist to not bill the patient for the repeat post-contrast imaging. 3. Advanced chronic microvascular ischemic disease, progressed since 2021. 4. Moderate right and small left mastoid effusions. Electronically Signed: By: Margaretha Sheffield M.D. On: 12/20/2020 14:29   IR Perc Cholecystostomy  Result Date: 12/20/2020 INDICATION: 70 year old male with small cell lung cancer and chemotherapy induced  pancytopenia presenting with neutropenic sepsis. Blood cultures grew out Klebsiella and imaging findings are concerning for acute cholecystitis. He is too sick and frail to be considered an operative candidate. Following transfusion of platelets, his platelets are now at an acceptable level to proceed with percutaneous cholecystostomy tube placement. EXAM: CHOLECYSTOSTOMY MEDICATIONS: In patient currently receiving intravenous antibiotic therapy. No additional antibiotic prophylaxis administered. ANESTHESIA/SEDATION: Moderate (conscious) sedation was employed during this procedure. A total of Versed 1 mg and Fentanyl 50 mcg and 0.5 mg Dilaudid administered intravenously. Moderate Sedation Time: 13 minutes. The patient's level of consciousness and vital signs were monitored continuously by radiology nursing throughout the procedure under my direct supervision. FLUOROSCOPY TIME:  Fluoroscopy Time: 0 minutes 30 seconds (4.3 mGy). COMPLICATIONS: None immediate. PROCEDURE: Informed written consent was obtained from the patient after a thorough discussion of the procedural risks, benefits and alternatives. All questions were addressed. Maximal Sterile Barrier Technique was utilized including caps, mask, sterile gowns, sterile gloves, sterile drape, hand hygiene and skin antiseptic. A timeout was performed prior to the initiation of the procedure. The right upper quadrant was interrogated with ultrasound. The distended gallbladder with a thickened  wall was successfully identified. There is significant excursion of the liver with respiration due to use of abdominal muscles. A suitable skin entry site was selected. Local anesthesia was attained by infiltration with 1% lidocaine. A small dermatotomy was made. Under real-time ultrasound guidance, the movement of the liver was timed with respirations and in a single pass, a 21 gauge Accustick needle was advanced through the liver and into the gallbladder lumen along a short  transhepatic course. A 0.018 wire was quickly advanced into the gallbladder lumen and the needle was exchanged for the transitional dilator. The transitional dilator was advanced into the gallbladder lumen. The wire was removed. Contrast injection was performed under fluoroscopy confirming opacification of the gallbladder lumen. Multiple filling defects consistent with cholelithiasis. The cystic duct is occluded. A 0.035 wire was then coiled within the gallbladder lumen. The transitional dilator was removed. The transhepatic tract was dilated to 10 [place] with a stiff dilator. A Cook 10.2 [place] all-purpose drainage catheter was then advanced over the wire and formed in the gallbladder lumen. Images were obtained and stored for the medical record. The catheter was gently flushed and connected to gravity bag drainage before being secured to the skin with 0 silk suture. IMPRESSION: Successful placement of 10 French transhepatic percutaneous cholecystostomy tube for an indication of calculus cholecystitis. PLAN: Maintain tube to gravity bag drainage. Patient will likely need home health to assist with care and cleaning of the tube site. Return to interventional radiology approximately every 8 weeks for cholecystostomy tube check and exchange. If patient's clinical status improves in the future, elective cholecystectomy would be optimal. If patient remains a poor operative candidate, long-term cholecystostomy tube maintenance will likely be required. Electronically Signed   By: Jacqulynn Cadet M.D.   On: 12/20/2020 13:50   DG Chest Port 1 View  Result Date: 12/17/2020 CLINICAL DATA:  Questionable sepsis.  Weakness EXAM: PORTABLE CHEST 1 VIEW COMPARISON:  03/09/2020 FINDINGS: Left subclavian approached chest port remains in place. Stable cardiomegaly. Chronic left basilar scarring or atelectasis. Peripheral left upper lobe nodule better seen on prior CT. No new airspace consolidation. Pleural effusion or  pneumothorax. IMPRESSION: Chronic left basilar scarring or atelectasis. No new or acute findings. Electronically Signed   By: Davina Poke D.O.   On: 12/17/2020 13:20   ECHOCARDIOGRAM COMPLETE  Result Date: 12/21/2020    ECHOCARDIOGRAM REPORT   Patient Name:   AKON REINOSO Date of Exam: 12/21/2020 Medical Rec #:  [id number]    Height:       72.0 in Accession #:    [id number]   Weight:       137.1 lb Date of Birth:  [birth year]     BSA:          1.815 m Patient Age:    47 years     BP:           114/80 mmHg Patient Gender: M            HR:           89 bpm. Exam Location:  [facility] Procedure: 2D Echo, Color Doppler and Cardiac Doppler Indications:     Endocarditis I38  History:         Patient has prior history of Echocardiogram examinations, most                  recent 08/19/2019. Arrythmias:Atrial Fibrillation; Risk  Factors:Hypertension. Substance abuse.  Sonographer:     Sherrie Sport Referring Phys:  [place] Diagnosing Phys: Serafina Royals MD IMPRESSIONS  1. Left ventricular ejection fraction, by estimation, is 60 to 65%. The left ventricle has normal function. The left ventricle has no regional wall motion abnormalities. Left ventricular diastolic parameters were normal.  2. Right ventricular systolic function is normal. The right ventricular size is normal.  3. The mitral valve is normal in structure. Trivial mitral valve regurgitation.  4. The aortic valve is normal in structure. Aortic valve regurgitation is not visualized. Mild aortic valve sclerosis is present, with no evidence of aortic valve stenosis. Conclusion(s)/Recommendation(s): No evidence of valve vegetations. FINDINGS  Left Ventricle: Left ventricular ejection fraction, by estimation, is 60 to 65%. The left ventricle has normal function. The left ventricle has no regional wall motion abnormalities. The left ventricular internal cavity size was small. There is no left ventricular hypertrophy. Left ventricular diastolic  parameters were normal. Right Ventricle: The right ventricular size is normal. No increase in right ventricular wall thickness. Right ventricular systolic function is normal. Left Atrium: Left atrial size was normal in size. Right Atrium: Right atrial size was normal in size. Pericardium: There is no evidence of pericardial effusion. Mitral Valve: The mitral valve is normal in structure. Trivial mitral valve regurgitation. Tricuspid Valve: The tricuspid valve is normal in structure. Tricuspid valve regurgitation is trivial. Aortic Valve: The aortic valve is normal in structure. Aortic valve regurgitation is not visualized. Mild aortic valve sclerosis is present, with no evidence of aortic valve stenosis. Aortic valve mean gradient measures 3.0 mmHg. Aortic valve peak gradient measures 5.9 mmHg. Aortic valve area, by VTI measures 2.30 cm. Pulmonic Valve: The pulmonic valve was normal in structure. Pulmonic valve regurgitation is not visualized. Aorta: The aortic root and ascending aorta are structurally normal, with no evidence of dilitation. IAS/Shunts: No atrial level shunt detected by color flow Doppler.  LEFT VENTRICLE PLAX 2D LVIDd:         3.87 cm LVIDs:         2.30 cm LV PW:         1.00 cm LV IVS:        1.18 cm LVOT diam:     2.10 cm LV SV:         44 LV SV Index:   24 LVOT Area:     3.46 cm  RIGHT VENTRICLE RV Basal diam:  3.70 cm RV S prime:     12.40 cm/s TAPSE (M-mode): 3.7 cm LEFT ATRIUM           Index       RIGHT ATRIUM           Index LA diam:      3.50 cm 1.93 cm/m  RA Area:     33.70 cm LA Vol (A2C): 57.4 ml 31.63 ml/m RA Volume:   128.00 ml 70.53 ml/m LA Vol (A4C): 72.1 ml 39.73 ml/m  AORTIC VALVE                   PULMONIC VALVE AV Area (Vmax):    2.34 cm    PV Vmax:        0.60 m/s AV Area (Vmean):   2.32 cm    PV Peak grad:   1.4 mmHg AV Area (VTI):     2.30 cm    RVOT Peak grad: 2 mmHg AV Vmax:  121.00 cm/s AV Vmean:          86.000 cm/s AV VTI:            0.193 m AV Peak  Grad:      5.9 mmHg AV Mean Grad:      3.0 mmHg LVOT Vmax:         81.60 cm/s LVOT Vmean:        57.600 cm/s LVOT VTI:          0.128 m LVOT/AV VTI ratio: 0.66  AORTA Ao Root diam: 3.50 cm MITRAL VALVE               TRICUSPID VALVE MV Area (PHT): 4.52 cm    TR Peak grad:   24.4 mmHg MV Decel Time: 168 msec    TR Vmax:        247.00 cm/s MV E velocity: 88.30 cm/s                            SHUNTS                            Systemic VTI:  0.13 m                            Systemic Diam: 2.10 cm Serafina Royals MD Electronically signed by Serafina Royals MD Signature Date/Time: 12/21/2020/5:20:09 PM    Final    US Abdomen Limited RUQ (LIVER/GB)  Result Date: 12/18/2020 CLINICAL DATA:  Evaluate for cholecystitis. EXAM: ULTRASOUND ABDOMEN LIMITED RIGHT UPPER QUADRANT COMPARISON:  CT AP from 12/17/2020 FINDINGS: Gallbladder: The gallbladder wall is diffusely edematous measuring up to 13.1 mm. Gallstones are noted measuring up to 5.9 mm. Pericholecystic fluid. Positive sonographic Murphy's sign. Common bile duct: Diameter: 6 mm Liver: Diffusely increased parenchymal echogenicity identified. No focal liver abnormality. Portal vein is patent on color Doppler imaging with normal direction of blood flow towards the liver. Other: 1.9 cm right kidney cyst. IMPRESSION: Gallstones, gallbladder wall thickening and pericholecystic fluid with positive sonographic Murphy's sign. Findings are compatible with acute cholecystitis. Electronically Signed   By: Kerby Moors M.D.   On: 12/18/2020 14:18    EKG: Atrial fibrillation with left axis deviation and poor R wave progression  Weights: Filed Weights   12/17/20 1203 12/18/20 2130  Weight: 61.7 kg 62.2 kg     Physical Exam: Blood pressure 114/73, pulse 77, temperature 98 F (36.7 C), temperature source Oral, resp. rate 18, height 6' (1.829 m), weight 62.2 kg, SpO2 92 %. Body mass index is 18.6 kg/m. General: Well developed, well nourished, in no acute distress. Head  eyes ears nose throat: Normocephalic, atraumatic, sclera non-icteric, no xanthomas, nares are without discharge. No apparent thyromegaly and/or mass  Lungs: Normal respiratory effort.  Few new wheezes, no rales, some rhonchi.  Heart: Irregular with normal S1 S2. no murmur gallop, no rub, PMI is normal size and placement, carotid upstroke normal without bruit, jugular venous pressure is normal Abdomen: Soft, non-tender, non-distended with normoactive bowel sounds. No hepatomegaly. No rebound/guarding. No obvious abdominal masses. Abdominal aorta is normal size without bruit Extremities: No edema. no cyanosis, no clubbing, no ulcers  Peripheral : 2+ bilateral upper extremity pulses, 2+ bilateral femoral pulses, 2+ bilateral dorsal pedal pulse Neuro: Alert and oriented. No facial asymmetry.  Positive for focal deficit. Moves all extremities spontaneously. Musculoskeletal: Normal  muscle tone without kyphosis Psych:  Responds to questions appropriately with a normal affect.    Assessment: 70 year old male with small cell lung cancer with significant complications of Klebsiella pneumonia and bacteremia with significant anemia and low platelets having a recent gallbladder cholecystitis status post tube placement now more stable but embolic phenomenon with stroke multifactorial in nature but likely secondary to atrial fibrillation and less likely due to endocarditis due to relatively pristine images by surface echocardiogram needing further treatment options  Plan: 1.  Continue supportive care for significant small cell lung cancer status postchemotherapy with anemia and low platelets as well as antibiotics for Klebsiella pneumonia and bacteremia with treatment also with reducing cholecystitis.  Patient currently has no evidence of congestive heart failure or angina 2.  Consideration of transesophageal echocardiogram but currently in question whether this would change the course of the patient's treatment  but due to the inability to anticoagulate for atrial fibrillation which is likely cause based on above information.  There is no evidence of significant valvular vegetation and or significant concerns of valvular insufficiency.  If course of antibiotics will be changed then would consider waiting for patient to have fewer neurologic abnormalities prior to sedation for transesophageal echocardiogram 3.  No further cardiac diagnostics until further discussion and treatment options with the patient and family and other caregivers 4.  Continuation of diltiazem for heart rate control of atrial fibrillation with a goal heart rate between 60 and 90 bpm 5.  Further treatment options after above  Signed, Corey Skains M.D. [facility] 12/21/2020, 5:28 PM

## 2020-12-21 NOTE — Progress Notes (Signed)
Subjective:  CC: Scott Jordan is a 70 y.o. male  Hospital stay day 4,   acute chole  HPI: No acute issues overnight.  Perc chole tube placed yesterday.  ROS:  General: Denies weight loss, weight gain, fatigue, fevers, chills, and night sweats. Heart: Denies chest pain, palpitations, racing heart, irregular heartbeat, leg pain or swelling, and decreased activity tolerance. Respiratory: Denies breathing difficulty, shortness of breath, wheezing, cough, and sputum. GI: Denies change in appetite, heartburn, nausea, vomiting, constipation, diarrhea, and blood in stool. GU: Denies difficulty urinating, pain with urinating, urgency, frequency, blood in urine.   Objective:   Temp:  [97.4 F (36.3 C)-98.2 F (36.8 C)] 97.4 F (36.3 C) (09/12 1050) Pulse Rate:  [89-93] 89 (09/12 1050) Resp:  [16-22] 20 (09/12 1050) BP: (106-120)/(78-82) 114/80 (09/12 1050) SpO2:  [92 %-95 %] 95 % (09/12 1050)     Height: 6' (182.9 cm) Weight: 62.2 kg BMI (Calculated): 18.59   Intake/Output this shift:   Intake/Output Summary (Last 24 hours) at 12/21/2020 1417 Last data filed at 12/21/2020 1100 Gross per 24 hour  Intake 1386.98 ml  Output 1065 ml  Net 321.98 ml    Constitutional :  alert, cooperative, appears stated age, and no distress  Respiratory:  clear to auscultation bilaterally  Cardiovascular:  regular rate and rhythm  Gastrointestinal: soft, non-tender; bowel sounds normal; no masses,  no organomegaly.   Skin: Cool and moist.   Psychiatric: Normal affect, non-agitated, not confused       LABS:  CMP Latest Ref Rng & Units 12/21/2020 12/20/2020 12/19/2020  Glucose 70 - 99 mg/dL 99 112(H) 118(H)  BUN 8 - 23 mg/dL 30(H) 36(H) 43(H)  Creatinine 0.61 - 1.24 mg/dL 0.57(L) 0.72 0.94  Sodium 135 - 145 mmol/L 141 137 138  Potassium 3.5 - 5.1 mmol/L 3.6 3.4(L) 3.3(L)  Chloride 98 - 111 mmol/L 111 107 110  CO2 22 - 32 mmol/L 22 22 22   Calcium 8.9 - 10.3 mg/dL 7.8(L) 7.6(L) 7.0(L)  Total Protein 6.5  - 8.1 g/dL - - -  Total Bilirubin 0.3 - 1.2 mg/dL - - -  Alkaline Phos 38 - 126 U/L - - -  AST 15 - 41 U/L - - -  ALT 0 - 44 U/L - - -   CBC Latest Ref Rng & Units 12/21/2020 12/20/2020 12/20/2020  WBC 4.0 - 10.5 K/uL 5.3 1.7(L) -  Hemoglobin 13.0 - 17.0 g/dL 8.8(L) 8.9(L) -  Hematocrit 39.0 - 52.0 % 24.9(L) 24.0(L) -  Platelets 150 - 400 K/uL 41(L) 42(L) 28(LL)    RADS: N/a Assessment:   Acute chole, s/p perc chole tube placement by IR secondary to comorbidities.  Pt likely will not become healthy enough to proceed with interval lap chole in the future, but he can f/u in our office in 6-8wks if his numbers improve to that point.  Earliest time we can consider it will be 6-8 weeks out from this episode.  Further care and f/u per IR as noted in their progress note.  Surgery to sign off

## 2020-12-21 NOTE — Progress Notes (Signed)
*  PRELIMINARY RESULTS* Echocardiogram 2D Echocardiogram has been performed.  Sherrie Sport 12/21/2020, 1:18 PM

## 2020-12-21 NOTE — Consult Note (Signed)
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  Upon arrival he was found to be severely neutropenic and septic.  Blood cultures came back positive for Klebsiella and he was started on Rocephin.  Right upper quadrant ultrasound confirmed cholelithiasis with cholecystitis and an IR guided cholecystostomy was performed.  Patient reported new onset weakness of his right arm and MRI brain was performed yesterday which revealed multifocal acute embolic appearing strokes in the bilateral cerebral hemispheres as well as the left cerebellum. Oncology is following.  Stroke w/u this admission:  MRI brain wwo contrast  1. Multiple small areas of restricted diffusion in the bilateral frontal and occipital and left parietal white matter, left perirolandic cortex, left thalamocapsular region, and left cerebellum, which are concerning for acute infarcts. Given involvement of multiple vascular territories, consider a central embolic etiology. 2. No abnl contrast enhancement 3. Advanced chronic microvascular ischemic disease, progressed since 2021. 4. Moderate right and small left mastoid effusions.  CTA H&N  1. Advanced chronic microvascular ischemic changes of the white matter. 2. Atherosclerotic disease of the  bilateral carotid bifurcation without hemodynamically significant stenosis. 3. Mild atherosclerotic changes at the origin of the bilateral vertebral arteries without hemodynamically significant stenosis. 4. Mild intracranial atherosclerotic disease without hemodynamically significant stenosis.  CNS imaging personally reviewed.  Pending: TTE LDL A1c    ROS   Per HPI; all other systems reviewed and are negative  Past History   Past Medical History:  Diagnosis Date  . A-fib ([place]) 01/10/2019   pt st this was dx by Dr. Ubaldo Glassing  . Cancer ([place]) 2021   LUNG  . Complication of anesthesia    DIFFICULTY WAKING UP AFTER SURGERY- 20 YRS AGO  . Hypertension   . Lung cancer ([place])   . Substance abuse [facility])    Past Surgical History:  Procedure Laterality Date  . ARM DEBRIDEMENT Left    INCISION AND DEBRIDEMENT LOWER ARM -20 YRS AGO  . BACK SURGERY    . IR PERC CHOLECYSTOSTOMY  12/20/2020  . PORTACATH PLACEMENT Left 08/29/2019   Procedure: INSERTION PORT-A-CATH;  Surgeon: Nestor Lewandowsky, MD;  Location: [facility] ORS;  Service: General;  Laterality: Left;   History reviewed. No pertinent family history. Social History   Socioeconomic History  . Marital status: Single    Spouse name: Not on file  . Number of children: Not on file  . Years of education: Not on file  . Highest education level: Not on file  Occupational History  . Not on file  Tobacco Use  . Smoking status: Former    Packs/day: 0.50    Types: Cigarettes  . Smokeless tobacco: Never  . Tobacco comments:    not smoked since 6 weeks ago  Vaping Use  . Vaping Use: Never used  Substance and  Sexual Activity  . Alcohol use: Yes    Comment: 84 OZ IN WEEK  . Drug use: Not Currently    Types: Heroin    Comment: heroin WITHIN PAST YEAR  . Sexual activity: Not on file  Other Topics Concern  . Not on file  Social History Narrative  . Not on file   Social Determinants of Health   Financial Resource Strain: Not on file   Food Insecurity: Not on file  Transportation Needs: Not on file  Physical Activity: Not on file  Stress: Not on file  Social Connections: Not on file   No Known Allergies  Medications   Medications Prior to Admission  Medication Sig Dispense Refill Last Dose  . diltiazem (TIAZAC) 240 MG 24 hr capsule Take 1 capsule by mouth daily.   12/16/2020  . ELIQUIS 5 MG TABS tablet Take 5 mg by mouth 2 (two) times daily.   12/16/2020  . magnesium oxide (MAG-OX) 400 MG tablet Take by mouth.   12/16/2020  . potassium chloride SA (KLOR-CON) 20 MEQ tablet TAKE 1 TABLET BY MOUTH DAILY FOR 3 DAYS 3 tablet 0 12/16/2020  . prochlorperazine (COMPAZINE) 10 MG tablet Take 1 tablet (10 mg total) by mouth every 6 (six) hours as needed for nausea or vomiting. 90 tablet 0 12/16/2020  . feeding supplement, ENSURE ENLIVE, (ENSURE ENLIVE) LIQD Take 237 mLs by mouth 3 (three) times daily between meals. 237 mL 12   . lidocaine-prilocaine (EMLA) cream Apply to affected area once (Patient not taking: No sig reported) 30 g 3   . MAVYRET 100-40 MG TABS Take 3 tablets by mouth daily. (Patient not taking: No sig reported)     . mirtazapine (REMERON) 15 MG tablet Take by mouth. (Patient not taking: No sig reported)     . naloxone (NARCAN) nasal spray 4 mg/0.1 mL once as needed (Patient not taking: No sig reported)     . ondansetron (ZOFRAN) 8 MG tablet Take 1 tablet (8 mg total) by mouth every 8 (eight) hours as needed for refractory nausea / vomiting. Start on day 3 after carboplatin chemo. (Patient not taking: No sig reported) 30 tablet 1      Vitals   Vitals:   12/21/20 0748 12/21/20 0807 12/21/20 1050 12/21/20 1506  BP: 110/78 106/81 114/80 114/73  Pulse: 91 90 89 77  Resp: (!) 22 16 20 18   Temp: 97.9 F (36.6 C) 97.6 F (36.4 C) (!) 97.4 F (36.3 C) 98 F (36.7 C)  TempSrc: Oral Oral Oral Oral  SpO2: 95% 92% 95% 92%  Weight:      Height:         Body mass index is 18.6 kg/m.  Physical Exam   Physical  Exam Gen: A&O x4, NAD Resp: CTAB CV: RRR  Neuro: *MS: A&O x4. Follows multi-step commands.  *Speech: fluid, nondysarthric, able to name and repeat *CN:    I: Deferred   II,III: PERRLA, VFF by confrontation, optic discs not visualized 2/2 pupillary construction   III,IV,VI: EOMI w/o nystagmus, no ptosis   V: Sensation intact from V1 to V3 to LT   VII: Eyelid closure was full.  R UMN facial droop   VIII: Hearing intact to voice   IX,X: Voice normal, palate elevates symmetrically    XI: SCM/trap 5/5 bilat   XII: Tongue protrudes midline, no atrophy or fasciculations   *Motor:   Normal bulk.  No tremor, rigidity or bradykinesia. Anti-gravity in all extremities, effort dependent, weakest in  RUE which drifts down to bed.  *Sensory: Impaired to PP RUE. No double-simultaneous extinction.  *Coordination:  Finger-to-nose, heel-to-shin, rapid alternating motions were intact. *Reflexes:  2+ and symmetric throughout without clonus; toes down-going bilat *Gait: deferred  NIHSS  1a Level of Conscious.: 0 1b LOC Questions: 0 1c LOC Commands: 0 2 Best Gaze: 0 3 Visual: 0 4 Facial Palsy: 1 5a Motor Arm - left: 1 5b Motor Arm - Right: 2 6a Motor Leg - Left: 1 6b Motor Leg - Right: 1 7 Limb Ataxia: 0 8 Sensory: 1 9 Best Language: 0 10 Dysarthria: 0 11 Extinct. and Inatten.: 0  TOTAL: 7   Premorbid mRS = 2   Labs    CBC:  Recent Labs  Lab 12/20/20 0500 12/21/20 0434  WBC 1.7* 5.3  NEUTROABS 0.7* 3.5  HGB 8.9* 8.8*  HCT 24.0* 24.9*  MCV 94.5 97.6  PLT 42* 41*    Basic Metabolic Panel:  Lab Results  Component Value Date   NA 141 12/21/2020   K 3.6 12/21/2020   CO2 22 12/21/2020   GLUCOSE 99 12/21/2020   BUN 30 (H) 12/21/2020   CREATININE 0.57 (L) 12/21/2020   CALCIUM 7.8 (L) 12/21/2020   GFRNONAA >60 12/21/2020   GFRAA >60 01/06/2020    Urine Drug Screen:     Component Value Date/Time   LABOPIA POSITIVE (A) 11/23/2019 0134   COCAINSCRNUR POSITIVE (A)  11/23/2019 0134   LABBENZ NONE DETECTED 11/23/2019 0134   AMPHETMU NONE DETECTED 11/23/2019 0134   THCU NONE DETECTED 11/23/2019 0134   LABBARB NONE DETECTED 11/23/2019 0134    Alcohol Level     Component Value Date/Time   ETH 140 (H) 11/22/2019 2116      Impression   70 year old gentleman with a history of 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  Recommendations   - Permissive HTN x48 hrs from sx onset goal BP <180/110 (lower BP goal 2/2 thrombocytopenia). PRN labetalol or hydralazine if BP above these parameters. Avoid oral antihypertensives. - Currently holding antiplatelets 2/2 severe thrombocytopenia (plt 9 on admission, 41 today) - CTA H&N - TTE w/ bubble pending, if no e/o intracardiac clot will need TEE. If e/o PFO, will need BLE dopplers f/b MRV pelvis if neg. Given bacteremia, endocarditis with septic emboli is also on the differential - Check A1c and LDL + add statin per guidelines - q4 hr neuro checks - STAT head CT for any change in neuro exam - Tele - PT/OT/SLP - Stroke education - Amb referral to neurology upon discharge   Will continue to follow.   ______________________________________________________________________   Thank you for the opportunity to take part in the care of this patient. If you have any further questions, please contact the neurology consultation attending.  Signed,  Su Monks, MD [facility] [phone]  If 7pm- 7am, please page neurology on call as listed in [place].

## 2020-12-21 NOTE — Progress Notes (Signed)
Hematology/Oncology Progress Note [facility] Telephone:([phone] [phone]  Patient Care Team: Earlie Server, MD as PCP - General (Oncology) Telford Nab, RN as Registered Nurse Erby Pian, MD as Referring Physician (Specialist) Teodoro Spray, MD as Consulting Physician (Cardiology) Anthonette Legato, MD as Consulting Physician (Nephrology) Vladimir Crofts, MD as Consulting Physician (Neurology) Meade Maw, MD as Consulting Physician (Neurosurgery)   Name of the patient: Scott Jordan  [id number]  30-Jan-1951  Date of visit: 12/21/20   INTERVAL HISTORY-  S/p cholecystostomy New onset of right upper extremity weakness 12/20/2020 MRI brain w wo contrast  1 Multiple small areas of restricted diffusion in the bilateral frontal and occipital and left parietal white matter, left perirolandic cortex, left thalamocapsular region, and left cerebellum, which are concerning for acute infarcts. Given involvement of multiple vascular territories, consider a central embolic etiology. 2. Contrast was administered; however, there is no evidence of enhancement, unclear why given reported uneventful injection per the technologist. This precludes evaluation for enhancing lesions. Given the patient's known small cell lung cancer diagnosis, recommend repeat postcontrast imaging to exclude metastatic disease. I've asked the MRI technologist to not bill the patient for the repeat post-contrast imaging. 3. Advanced chronic microvascular ischemic disease, progressed since 2021. 4. Moderate right and small left mastoid effusions.  Neurology is consulted.     Review of systems- Review of Systems  Constitutional:  Positive for appetite change, fatigue and unexpected weight change. Negative for chills and fever.  HENT:   Negative for hearing loss and voice change.   Eyes:  Negative for eye problems and icterus.  Respiratory:  Negative for chest tightness,  cough and shortness of breath.   Cardiovascular:  Negative for chest pain and leg swelling.  Gastrointestinal:  Negative for abdominal distention, abdominal pain and blood in stool.       Mild generalized Abdominal tenderness  Endocrine: Negative for hot flashes.  Genitourinary:  Negative for difficulty urinating, dysuria and frequency.   Musculoskeletal:  Negative for arthralgias.       Right upper extremity weakness.   Skin:  Negative for itching and rash.  Neurological:  Negative for light-headedness and numbness.  Hematological:  Negative for adenopathy. Does not bruise/bleed easily.  Psychiatric/Behavioral:  Negative for confusion.    No Known Allergies  Patient Active Problem List   Diagnosis Date Noted   Cholelithiasis with acute cholecystitis 12/20/2020   Cholecystitis    Septicemia due to Klebsiella pneumoniae ([place]) 12/18/2020   Neutropenic sepsis ([place]) 12/17/2020   Head trauma 06/24/2020   Ulnar neuropathy of left upper extremity 05/13/2020   Cervical radiculopathy 05/13/2020   B12 deficiency 04/06/2020   Weight loss 03/23/2020   Neuropathy of left upper extremity 03/22/2020   Hepatitis B infection without delta agent without hepatic coma 12/30/2019   Chronic hepatitis C without hepatic coma ([place]) 12/30/2019   Chronic anticoagulation 12/23/2019   Elevated LFTs 12/23/2019   Hypokalemia 12/08/2019   Acute respiratory failure with hypoxia ([place]) 11/23/2019   Heroin overdose, accidental or unintentional, initial encounter ([place]) 11/23/2019   Aspiration pneumonia ([place]) 11/23/2019   Pancytopenia ([place]) 11/23/2019   Atypical atrial flutter ([place]) 11/23/2019   Hypomagnesemia 10/21/2019   Antineoplastic chemotherapy induced anemia 10/13/2019   Dehydration 10/13/2019   Thrombocytopenia ([place]) 10/13/2019   Chemotherapy-induced neutropenia ([place]) 10/10/2019   Anemia 09/11/2019   Cancer-related pain 09/05/2019   Encounter for antineoplastic chemotherapy 09/02/2019   Encounter for  antineoplastic immunotherapy 09/02/2019   Bone metastasis ([place]) 09/02/2019  Goals of care, counseling/discussion 08/26/2019   Small cell lung cancer ([place]) 08/23/2019   Protein-calorie malnutrition, severe 08/19/2019   Hyponatremia    Dyspnea    Pleural effusion    Recurrent pleural effusion on left    Malignant pleural effusion 08/17/2019   Alcohol abuse 07/21/2019   History of substance abuse ([place]) 06/18/2019   Hypertension, essential 06/18/2019   Malnutrition of moderate degree 01/15/2019   Elevated troponin 01/14/2019     Past Medical History:  Diagnosis Date   A-fib ([place]) 01/10/2019   pt st this was dx by Dr. Ubaldo Glassing   Cancer [facility]) 6606   LUNG   Complication of anesthesia    DIFFICULTY WAKING UP AFTER SURGERY- 81 YRS AGO   Hypertension    Lung cancer ([place])    Substance abuse ([place])      Past Surgical History:  Procedure Laterality Date   ARM DEBRIDEMENT Left    INCISION AND DEBRIDEMENT LOWER ARM -61 YRS AGO   BACK SURGERY     IR PERC CHOLECYSTOSTOMY  12/20/2020   PORTACATH PLACEMENT Left 08/29/2019   Procedure: INSERTION PORT-A-CATH;  Surgeon: Nestor Lewandowsky, MD;  Location: [facility] ORS;  Service: General;  Laterality: Left;    Social History   Socioeconomic History   Marital status: Single    Spouse name: Not on file   Number of children: Not on file   Years of education: Not on file   Highest education level: Not on file  Occupational History   Not on file  Tobacco Use   Smoking status: Former    Packs/day: 0.50    Types: Cigarettes   Smokeless tobacco: Never   Tobacco comments:    not smoked since 6 weeks ago  Vaping Use   Vaping Use: Never used  Substance and Sexual Activity   Alcohol use: Yes    Comment: 12 OZ IN WEEK   Drug use: Not Currently    Types: Heroin    Comment: heroin WITHIN PAST YEAR   Sexual activity: Not on file  Other Topics Concern   Not on file  Social History Narrative   Not on file   Social Determinants of Health   Financial  Resource Strain: Not on file  Food Insecurity: Not on file  Transportation Needs: Not on file  Physical Activity: Not on file  Stress: Not on file  Social Connections: Not on file  Intimate Partner Violence: Not on file     History reviewed. No pertinent family history.   Current Facility-Administered Medications:    (feeding supplement) PROSource Plus liquid 30 mL, 30 mL, Oral, BID BM, Sharen Hones, MD, 30 mL at 12/21/20 1056   0.9 %  sodium chloride infusion (Manually program via Guardrails IV Fluids), , Intravenous, Once, Sharen Hones, MD   0.9 %  sodium chloride infusion (Manually program via Guardrails IV Fluids), , Intravenous, Once, Sharen Hones, MD   0.9 %  sodium chloride infusion, , Intravenous, PRN, Sharen Hones, MD, Stopped at 12/20/20 1551   0.9 %  sodium chloride infusion, , , Continuous PRN, Criselda Peaches, MD, Last Rate: 10 mL/hr at 12/20/20 1220, 10 mL/hr at 12/20/20 1220   0.9 % NaCl with KCl 20 mEq/ L  infusion, , Intravenous, Continuous, Sharen Hones, MD, Last Rate: 50 mL/hr at 12/21/20 1344, New Bag at 12/21/20 1344   acetaminophen (TYLENOL) tablet 650 mg, 650 mg, Oral, Q6H PRN **OR** acetaminophen (TYLENOL) suppository 650 mg, 650 mg, Rectal, Q6H PRN, Cox, Amy N, DO  cefTRIAXone (ROCEPHIN) 2 g in sodium chloride 0.9 % 100 mL IVPB, 2 g, Intravenous, Q24H, Sharen Hones, MD, Stopped at 12/21/20 0509   chlorhexidine (PERIDEX) 0.12 % solution 10 mL, 10 mL, Mouth/Throat, BID, Earlie Server, MD, 10 mL at 12/19/20 2133   Chlorhexidine Gluconate Cloth 2 % PADS 6 each, 6 each, Topical, Daily, Cox, Amy N, DO, 6 each at 12/21/20 1056   diltiazem (CARDIZEM CD) 24 hr capsule 240 mg, 240 mg, Oral, Daily, Cox, Amy N, DO, 240 mg at 12/21/20 1051   diltiazem (CARDIZEM) injection 10 mg, 10 mg, Intravenous, Q2H PRN, Cox, Amy N, DO   dronabinol (MARINOL) capsule 2.5 mg, 2.5 mg, Oral, BID AC, Sharen Hones, MD, 2.5 mg at 12/21/20 1257   feeding supplement (BOOST / RESOURCE BREEZE) liquid 1  Container, 1 Container, Oral, TID BM, Sharen Hones, MD   feeding supplement (ENSURE ENLIVE / ENSURE PLUS) liquid 237 mL, 237 mL, Oral, TID BM, Cox, Amy N, DO, 237 mL at 12/20/20 2107   fentaNYL (SUBLIMAZE) injection, , , PRN, Criselda Peaches, MD, 50 mcg at 90/24/09 7353   folic acid (FOLVITE) tablet 1 mg, 1 mg, Oral, Daily, Sharen Hones, MD, 1 mg at 12/21/20 1051   HYDROmorphone (DILAUDID) injection, , , PRN, Criselda Peaches, MD, 0.5 mg at 12/20/20 1259   metroNIDAZOLE (FLAGYL) IVPB 500 mg, 500 mg, Intravenous, Q12H, Max Sane, MD, Last Rate: 100 mL/hr at 12/21/20 1349, 500 mg at 12/21/20 1349   midazolam (VERSED) injection, , , PRN, Criselda Peaches, MD, 1 mg at 12/20/20 1250   multivitamin with minerals tablet 1 tablet, 1 tablet, Oral, Daily, Sharen Hones, MD, 1 tablet at 12/21/20 1049   mupirocin ointment (BACTROBAN) 2 % 1 application, 1 application, Nasal, BID, Cox, Amy N, DO, 1 application at 29/92/42 1053   ondansetron (ZOFRAN) tablet 4 mg, 4 mg, Oral, Q6H PRN **OR** ondansetron (ZOFRAN) injection 4 mg, 4 mg, Intravenous, Q6H PRN, Cox, Amy N, DO   oxyCODONE-acetaminophen (PERCOCET/ROXICET) 5-325 MG per tablet 1 tablet, 1 tablet, Oral, Q6H PRN, Mansy, Jan A, MD, 1 tablet at 12/21/20 1049   phosphorus (K PHOS NEUTRAL) tablet 500 mg, 500 mg, Oral, Q4H, Shah, Vipul, MD   sodium chloride flush (NS) 0.9 % injection 5 mL, 5 mL, Intracatheter, Q8H, Criselda Peaches, MD, 5 mL at 12/21/20 0437   thiamine tablet 100 mg, 100 mg, Oral, Daily, Sharen Hones, MD, 100 mg at 12/21/20 1051  Facility-Administered Medications Ordered in Other Encounters:    heparin lock flush 100 unit/mL, 500 Units, Intravenous, Once, Corcoran, Melissa C, MD   sodium chloride flush (NS) 0.9 % injection 10 mL, 10 mL, Intravenous, PRN, Nolon Stalls C, MD, 10 mL at 11/11/19 0812   Physical exam:  Vitals:   12/21/20 0442 12/21/20 0748 12/21/20 0807 12/21/20 1050  BP: 120/82 110/78 106/81 114/80  Pulse: 90  91 90 89  Resp: 17 (!) 22 16 20   Temp: 98 F (36.7 C) 97.9 F (36.6 C) 97.6 F (36.4 C) (!) 97.4 F (36.3 C)  TempSrc:  Oral Oral Oral  SpO2: 94% 95% 92% 95%  Weight:      Height:       Physical Exam Constitutional:      General: He is not in acute distress.    Appearance: He is not diaphoretic.  HENT:     Head: Normocephalic and atraumatic.     Nose: Nose normal.     Mouth/Throat:     Pharynx: No oropharyngeal  exudate.  Eyes:     General: No scleral icterus.    Pupils: Pupils are equal, round, and reactive to light.  Cardiovascular:     Rate and Rhythm: Normal rate and regular rhythm.     Heart sounds: No murmur heard. Pulmonary:     Effort: Pulmonary effort is normal. No respiratory distress.     Breath sounds: No rales.  Chest:     Chest wall: No tenderness.  Abdominal:     General: There is no distension.     Palpations: Abdomen is soft.     Tenderness: There is no abdominal tenderness.  Musculoskeletal:        General: Normal range of motion.     Cervical back: Normal range of motion and neck supple.  Skin:    General: Skin is warm and dry.     Findings: No erythema.  Neurological:     Mental Status: He is alert and oriented to person, place, and time.     Cranial Nerves: No cranial nerve deficit.     Motor: No abnormal muscle tone.     Coordination: Coordination normal.  Psychiatric:        Mood and Affect: Affect normal.       CMP Latest Ref Rng & Units 12/21/2020  Glucose 70 - 99 mg/dL 99  BUN 8 - 23 mg/dL 30(H)  Creatinine 0.61 - 1.24 mg/dL 0.57(L)  Sodium 135 - 145 mmol/L 141  Potassium 3.5 - 5.1 mmol/L 3.6  Chloride 98 - 111 mmol/L 111  CO2 22 - 32 mmol/L 22  Calcium 8.9 - 10.3 mg/dL 7.8(L)  Total Protein 6.5 - 8.1 g/dL -  Total Bilirubin 0.3 - 1.2 mg/dL -  Alkaline Phos 38 - 126 U/L -  AST 15 - 41 U/L -  ALT 0 - 44 U/L -   CBC Latest Ref Rng & Units 12/21/2020  WBC 4.0 - 10.5 K/uL 5.3  Hemoglobin 13.0 - 17.0 g/dL 8.8(L)  Hematocrit 39.0  - 52.0 % 24.9(L)  Platelets 150 - 400 K/uL 41(L)    RADIOGRAPHIC STUDIES: I have personally reviewed the radiological images as listed and agreed with the findings in the report. CT ABDOMEN PELVIS WO CONTRAST  Result Date: 12/17/2020 CLINICAL DATA:  Nausea and vomiting EXAM: CT ABDOMEN AND PELVIS WITHOUT CONTRAST TECHNIQUE: Multidetector CT imaging of the abdomen and pelvis was performed following the standard protocol without IV contrast. COMPARISON:  CT 06/23/2020, PET CT 11/19/2020 FINDINGS: Lower chest: Lung bases demonstrate emphysema. Subpleural bandlike airspace disease at the left lower lobe redemonstrated. Incompletely visualized subpleural peripheral left upper lobe nodule measuring 11 mm, series 5, image 1. Cardiomegaly with trace pericardial effusion. Coronary vascular calcification. Hepatobiliary: Extensive motion degradation. No focal hepatic abnormality. Gallstones. Gallbladder slightly distended. Possible wall thickening or pericholecystic fluid though limited by motion Pancreas: Unremarkable. No pancreatic ductal dilatation or surrounding inflammatory changes. Spleen: Normal in size without focal abnormality. Adrenals/Urinary Tract: Thickened appearance of adrenal glands. No hydronephrosis. The bladder is unremarkable Stomach/Bowel: Stomach nonenlarged. No dilated small bowel. Negative appendix. No acute bowel wall thickening Vascular/Lymphatic: Advanced aortic atherosclerosis. No aneurysm. Slightly increased multiple small retroperitoneal lymph nodes. Reproductive: Prostate is unremarkable. Other: Negative for free air or free fluid Musculoskeletal: No acute osseous abnormality. IMPRESSION: 1. Degraded by motion. 2. Gallbladder appears slightly distended and there are gallstones. Possible gallbladder wall thickening or pericholecystic fluid though limited by motion, consider correlation with ultrasound 3. Cardiomegaly with trace pericardial effusion. 4. Similar subpleural bandlike density at  left lower lobe. Incompletely visualized peripheral left upper lobe lung nodule characterized on recent PET CT Electronically Signed   By: Donavan Foil M.D.   On: 12/17/2020 16:50   CT HEAD WO CONTRAST (5MM)  Result Date: 12/17/2020 CLINICAL DATA:  Nausea vomiting EXAM: CT HEAD WITHOUT CONTRAST TECHNIQUE: Contiguous axial images were obtained from the base of the skull through the vertex without intravenous contrast. COMPARISON:  CT brain 06/24/2020, MRI 03/10/2020, head CT 11/22/2019 FINDINGS: Brain: No acute territorial infarction, hemorrhage or intracranial mass. Extensive white matter disease, progressed from more remote exams from 2021. Stable ventricle size. Chronic lacunar infarcts within the left white matter. Vascular: No hyperdense vessel.  Carotid vascular calcification Skull: Normal. Negative for fracture or focal lesion. Sinuses/Orbits: Mucosal thickening the sinuses. Chronic appearing nasal deformity Other: None IMPRESSION: 1. No definite CT evidence for acute intracranial abnormality. 2. Atrophy. Extensive white matter disease, grossly stable as compared with recent head CT from March, appears progressive when compared to exams from 2021 and prior Electronically Signed   By: Donavan Foil M.D.   On: 12/17/2020 16:39   MR BRAIN W WO CONTRAST  Result Date: 12/20/2020 CLINICAL DATA:  Neuro deficit, acute, stroke suspected Small cell lung cancer, monitor EXAM: MRI HEAD WITHOUT AND WITH CONTRAST TECHNIQUE: Multiplanar, multiecho pulse sequences of the brain and surrounding structures were obtained without and with intravenous contrast. CONTRAST:  7.22mL GADAVIST GADOBUTROL 1 MMOL/ML IV SOLN COMPARISON:  CT head 12/17/2020. FINDINGS: Mildly motion limited exam.  Within this limitation: Brain: Multiple small areas of restricted diffusion in the bilateral frontal and occipital, and left parietal matter, left perirolandic cortex, left thalamocapsular region, and left cerebellum. Advanced confluent T2  hyperintensity within the white matter, nonspecific but compatible with chronic microvascular ischemic disease and progressed since 2021. Small remote infarct in the left corona radiata. No hydrocephalus, obvious mass lesion, midline shift, acute hemorrhage, or extra-axial fluid collection. Punctate foci of susceptibility artifact in the left cerebellum and left frontal white matter, nonspecific but potentially related to prior microhemorrhage. Contrast was administered; however, there is no evidence of enhancement (for example, the nasal mucosa/turbinates are nonenhancing). This precludes evaluation for enhancing lesions. Vascular: Major arterial flow voids are maintained at the skull base. Skull and upper cervical spine: Normal marrow signal. Sinuses/Orbits: Mild paranasal sinus disease. Other: Moderate right and small left mastoid effusions. IMPRESSION: 1. Multiple small areas of restricted diffusion in the bilateral frontal and occipital and left parietal white matter, left perirolandic cortex, left thalamocapsular region, and left cerebellum, which are concerning for acute infarcts. Given involvement of multiple vascular territories, consider a central embolic etiology. 2. Contrast was administered; however, there is no evidence of enhancement, unclear why given reported uneventful injection per the technologist. This precludes evaluation for enhancing lesions. Given the patient's known small cell lung cancer diagnosis, recommend repeat postcontrast imaging to exclude metastatic disease. I've asked the MRI technologist to not bill the patient for the repeat post-contrast imaging. 3. Advanced chronic microvascular ischemic disease, progressed since 2021. 4. Moderate right and small left mastoid effusions. Electronically Signed   By: Margaretha Sheffield M.D.   On: 12/20/2020 14:29   IR Perc Cholecystostomy  Result Date: 12/20/2020 INDICATION: 70 year old male with small cell lung cancer and chemotherapy induced  pancytopenia presenting with neutropenic sepsis. Blood cultures grew out Klebsiella and imaging findings are concerning for acute cholecystitis. He is too sick and frail to be considered an operative candidate. Following transfusion of platelets, his platelets are now at an acceptable  level to proceed with percutaneous cholecystostomy tube placement. EXAM: CHOLECYSTOSTOMY MEDICATIONS: In patient currently receiving intravenous antibiotic therapy. No additional antibiotic prophylaxis administered. ANESTHESIA/SEDATION: Moderate (conscious) sedation was employed during this procedure. A total of Versed 1 mg and Fentanyl 50 mcg and 0.5 mg Dilaudid administered intravenously. Moderate Sedation Time: 13 minutes. The patient's level of consciousness and vital signs were monitored continuously by radiology nursing throughout the procedure under my direct supervision. FLUOROSCOPY TIME:  Fluoroscopy Time: 0 minutes 30 seconds (4.3 mGy). COMPLICATIONS: None immediate. PROCEDURE: Informed written consent was obtained from the patient after a thorough discussion of the procedural risks, benefits and alternatives. All questions were addressed. Maximal Sterile Barrier Technique was utilized including caps, mask, sterile gowns, sterile gloves, sterile drape, hand hygiene and skin antiseptic. A timeout was performed prior to the initiation of the procedure. The right upper quadrant was interrogated with ultrasound. The distended gallbladder with a thickened wall was successfully identified. There is significant excursion of the liver with respiration due to use of abdominal muscles. A suitable skin entry site was selected. Local anesthesia was attained by infiltration with 1% lidocaine. A small dermatotomy was made. Under real-time ultrasound guidance, the movement of the liver was timed with respirations and in a single pass, a 21 gauge Accustick needle was advanced through the liver and into the gallbladder lumen along a short  transhepatic course. A 0.018 wire was quickly advanced into the gallbladder lumen and the needle was exchanged for the transitional dilator. The transitional dilator was advanced into the gallbladder lumen. The wire was removed. Contrast injection was performed under fluoroscopy confirming opacification of the gallbladder lumen. Multiple filling defects consistent with cholelithiasis. The cystic duct is occluded. A 0.035 wire was then coiled within the gallbladder lumen. The transitional dilator was removed. The transhepatic tract was dilated to 10 [place] with a stiff dilator. A Cook 10.2 [place] all-purpose drainage catheter was then advanced over the wire and formed in the gallbladder lumen. Images were obtained and stored for the medical record. The catheter was gently flushed and connected to gravity bag drainage before being secured to the skin with 0 silk suture. IMPRESSION: Successful placement of 10 French transhepatic percutaneous cholecystostomy tube for an indication of calculus cholecystitis. PLAN: Maintain tube to gravity bag drainage. Patient will likely need home health to assist with care and cleaning of the tube site. Return to interventional radiology approximately every 8 weeks for cholecystostomy tube check and exchange. If patient's clinical status improves in the future, elective cholecystectomy would be optimal. If patient remains a poor operative candidate, long-term cholecystostomy tube maintenance will likely be required. Electronically Signed   By: Jacqulynn Cadet M.D.   On: 12/20/2020 13:50   DG Chest Port 1 View  Result Date: 12/17/2020 CLINICAL DATA:  Questionable sepsis.  Weakness EXAM: PORTABLE CHEST 1 VIEW COMPARISON:  03/09/2020 FINDINGS: Left subclavian approached chest port remains in place. Stable cardiomegaly. Chronic left basilar scarring or atelectasis. Peripheral left upper lobe nodule better seen on prior CT. No new airspace consolidation. Pleural effusion or  pneumothorax. IMPRESSION: Chronic left basilar scarring or atelectasis. No new or acute findings. Electronically Signed   By: Davina Poke D.O.   On: 12/17/2020 13:20   US Abdomen Limited RUQ (LIVER/GB)  Result Date: 12/18/2020 CLINICAL DATA:  Evaluate for cholecystitis. EXAM: ULTRASOUND ABDOMEN LIMITED RIGHT UPPER QUADRANT COMPARISON:  CT AP from 12/17/2020 FINDINGS: Gallbladder: The gallbladder wall is diffusely edematous measuring up to 13.1 mm. Gallstones are noted measuring up to 5.9  mm. Pericholecystic fluid. Positive sonographic Murphy's sign. Common bile duct: Diameter: 6 mm Liver: Diffusely increased parenchymal echogenicity identified. No focal liver abnormality. Portal vein is patent on color Doppler imaging with normal direction of blood flow towards the liver. Other: 1.9 cm right kidney cyst. IMPRESSION: Gallstones, gallbladder wall thickening and pericholecystic fluid with positive sonographic Murphy's sign. Findings are compatible with acute cholecystitis. Electronically Signed   By: Kerby Moors M.D.   On: 12/18/2020 14:18    Assessment and plan-   #Neutropenic sepsis secondary to Klebsiella pneumonia bacteremia Ultrasound findings is compatible with acute cholecystitis.  Likely the source of infection. Urine culture-no growth. S/p cholecystostomy On IV rocephin.    #Chemotherapy-induced pancytopenia Counts are improving. ANC normal, hb 8.8 Platelet count has improved to 41,000.  Monitor.   # Acute right upper extremity weakness.  MRI brain findings were discussed with patient.  Multiple acute infarct, suspect septic embolism.  Check echo.  Appreciate neurology recommendation.  Recommend ID consultation.   # Extensive small cell lung caner,  S/p 1 cycle of chemo. Currently admitted due to chemotherapy induced pancytopenia, sepsis. Counts have improved. Consult palliative care for goals of care discussion.  Depending on the etiology of his stroke and his revery, will  decide if he is candidate for further treatment or not.   #AKI has improved.  Creatinine has improved 2.9. #Chronic paroxysmal atrial fibrillation, off anticoagulation due to thrombocytopenia. Thank you for allowing me to participate in the care of this patient.   Earlie Server, MD, PhD Hematology Oncology [place] at [facility]  12/21/2020

## 2020-12-21 NOTE — Telephone Encounter (Signed)
Please advise 

## 2020-12-21 NOTE — Progress Notes (Signed)
PROGRESS NOTE    Scott Jordan  PNT:[id number] DOB: Jun 14, 1950 DOA: 12/17/2020 PCP: Earlie Server, MD   Chief complaint.  Nausea vomiting. Brief Narrative:  Scott Jordan is a 70 y.o. 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  Upon arriving the hospital, patient was found to be septic with elevated respirate, tachycardia and severe neutropenia.  She was placed on broad-spectrum antibiotics.  Blood culture came back positive for Klebsiella, antibiotic switched to Rocephin. Right upper quadrant ultrasound performed on 9/10 confirmed cholelithiasis with cholecystitis.  IR guided cholecystostomy On 9/11  Assessment & Plan:   Principal Problem:   Neutropenic sepsis ([place]) Active Problems:   Protein-calorie malnutrition, severe   Small cell lung cancer ([place])   Bone metastasis ([place])   Chemotherapy induced neutropenia ([facility])   Antineoplastic chemotherapy induced anemia   Thrombocytopenia (H[facility])   Atypical atrial flutter ([facility])   Chronic anticoagulation   Alcohol abuse   History of substance abuse ([place])   Hypertension, essential   B12 deficiency   Septicemia due to Klebsiella pneumoniae ([place])   Cholecystitis   Cholelithiasis with acute cholecystitis   Cerebrovascular accident (CVA) due to embolism of precerebral artery ([place])  Neutropenic sepsis. Septicemia secondary to Klebsiella pneumoniae. Septic endocarditis? Klebsiella pneumonia ruled out. Cholelithiasis with cholecystitis secondary to Klebsiella pneumoniae. S/p IR guided cholecystostomy on 9/11 Continue Rocephin.  We will add IV Flagyl to  cover anaerobes considering gallbladder is likely source -monitor his port.  Currently does not look infected. -Obtain 2D echo and depending on results may need TEE to rule out endocarditis -Cardiology consult.  Dr. Nehemiah Massed aware -Surgery has signed off as no plan for cholecystectomy on this admission.  Outpatient follow-up in 6 to 8 weeks requested by them  Small cell lung cancer. Pancytopenia related to chemotherapy. S/p 3 units of platelets this admission with improvement of platelet count to 41,000 today. Oncology following  Acute stroke with new right-sided weakness.   Confirmed on MRI on 9/11 concerning for central/embolic etiology -septic emboli? Neurology consult pending -For some reason MRI was not done with contrast and may need to be redone to evaluate for any mets  Paroxysmal atrial fibrillation. Not on anticoagulation due to severe thrombocytopenia. Cardiology consultation, 2D echo, monitor on telemetry.  Severe protein calorie malnutrition Anorexia BMI 18.6 Due to malignancy, chemo On Marinol and protein supplements  AKI Hypokalemia/hyponatremia Resolved with hydration.  Likely prerenal.  Electrolytes replaced  Alcohol use disorder No withdrawal signs or symptoms  Anemia of chronic kidney disease Hemoglobin 8.8, no active bleeding.  Monitor H&H  Goals of care With multiple medical comorbidities including extensive small cell lung cancer and now new onset stroke and possible septic emboli.  Very poor prognosis.  Palliative care consult    DVT prophylaxis: SCDs Code Status: full Family Communication: Updated Patient's Niece over phone [phone] Disposition Plan: SNF once medically stable   Status is: Inpatient  Remains inpatient appropriate because:Ongoing diagnostic testing needed not appropriate for outpatient work up, IV treatments appropriate due to intensity of illness or inability to take PO, and Inpatient level of care appropriate due to  severity of illness  Dispo: The patient is from: Home  Anticipated d/c is to: SNF              Patient currently is not medically stable to d/c.   Difficult to place patient No     I/O last 3 completed shifts: In: 1932.5 [P.O.:120; I.V.:837; Blood:765.5; Other:10; IV [id number]:676] Out: 1950 [Urine:1300; Drains:195] Total I/O In: 220 [P.O.:220] Out: 220 [Urine:120; Drains:100]     Consultants:  IR, General surgery, oncology Cardiology, palliative care, neurology  Procedures: IR guided cholecystostomy  Antimicrobials: Rocephin. Flagyl started on 9/12  Subjective: Continues to struggle with movement of his right arm.  Feeling very weak and tired.  Showing his drainage from cholecystostomy  Objective: Vitals:   12/21/20 0442 12/21/20 0748 12/21/20 0807 12/21/20 1050  BP: 120/82 110/78 106/81 114/80  Pulse: 90 91 90 89  Resp: 17 (!) 22 16 20   Temp: 98 F (36.7 C) 97.9 F (36.6 C) 97.6 F (36.4 C) (!) 97.4 F (36.3 C)  TempSrc:  Oral Oral Oral  SpO2: 94% 95% 92% 95%  Weight:      Height:        Intake/Output Summary (Last 24 hours) at 12/21/2020 1436 Last data filed at 12/21/2020 1100 Gross per 24 hour  Intake 1386.98 ml  Output 1065 ml  Net 321.98 ml   Filed Weights   12/17/20 1203 12/18/20 2130  Weight: 61.7 kg 62.2 kg    Examination:  General exam: Appears calm and comfortable  Respiratory system: Clear to auscultation. Respiratory effort normal. Cardiovascular system: S1 & S2 heard, RRR. No JVD, murmurs, rubs, gallops or clicks. No pedal edema. Gastrointestinal system: Abdomen is nondistended, soft and nontender. No organomegaly or masses felt. Normal bowel sounds heard.  Cholecystostomy tube draining Central nervous system: Alert and oriented. No focal neurological deficits. Extremities: Right arm weakness. Skin: No rashes, lesions or ulcers.  Port on left upper chest with no signs/symptoms of infection Psychiatry: Judgement and insight  appear normal. Mood & affect appropriate.     Data Reviewed: I have personally reviewed following labs and imaging studies  CBC: Recent Labs  Lab 12/17/20 1205 12/17/20 2000 12/18/20 0254 12/18/20 0857 12/19/20 0457 12/20/20 0047 12/20/20 0500 12/21/20 0434  WBC 0.2* 0.4*  --  0.4* 0.6*  --  1.7* 5.3  NEUTROABS 0.0*  --   --  0.1* 0.3*  --  0.7* 3.5  HGB 10.1* 9.8*  --  9.2* 8.9*  --  8.9* 8.8*  HCT 27.0* 26.1*  --  25.6* 23.8*  --  24.0* 24.9*  MCV 95.4 96.3  --  94.1 94.4  --  94.5 97.6  PLT 7* 8*   < > 16* 9* 28* 42* 41*   < > = values in this interval not displayed.   Basic Metabolic Panel: Recent Labs  Lab 12/17/20 1205 12/18/20 0500 12/19/20 0457 12/20/20 0500 12/21/20 0434  NA 136 135 138 137 141  K 3.3* 3.0* 3.3* 3.4* 3.6  CL 101 101 110 107 111  CO2 22 21* 22 22 22   GLUCOSE 209* 166* 118* 112* 99  BUN 31* 48* 43* 36* 30*  CREATININE 1.45* 1.46* 0.94 0.72 0.57*  CALCIUM 8.0* 7.4* 7.0* 7.6* 7.8*  MG  --   --  1.6* 2.1 2.0  PHOS  --   --   --   --  1.6*   GFR: Estimated Creatinine Clearance: 76.7 mL/min (A) (by C-G formula based on SCr of 0.57 mg/dL (L)). Liver Function Tests: Recent Labs  Lab 12/17/20 1205  AST  51*  ALT 32  ALKPHOS 50  BILITOT 1.8*  PROT 6.3*  ALBUMIN 3.1*   No results for input(s): LIPASE, AMYLASE in the last 168 hours. No results for input(s): AMMONIA in the last 168 hours. Coagulation Profile: Recent Labs  Lab 12/17/20 1205 12/18/20 0500  INR 1.5* 1.6*   Cardiac Enzymes: No results for input(s): CKTOTAL, CKMB, CKMBINDEX, TROPONINI in the last 168 hours. BNP (last 3 results) No results for input(s): PROBNP in the last 8760 hours. HbA1C: No results for input(s): HGBA1C in the last 72 hours. CBG: Recent Labs  Lab 12/18/20 2134 12/21/20 0809 12/21/20 1227  GLUCAP 73 118* 125*   Lipid Profile: No results for input(s): CHOL, HDL, LDLCALC, TRIG, CHOLHDL, LDLDIRECT in the last 72 hours. Thyroid Function Tests: No  results for input(s): TSH, T4TOTAL, FREET4, T3FREE, THYROIDAB in the last 72 hours. Anemia Panel: No results for input(s): VITAMINB12, FOLATE, FERRITIN, TIBC, IRON, RETICCTPCT in the last 72 hours. Sepsis Labs: Recent Labs  Lab 12/17/20 1205 12/17/20 1532 12/17/20 1926 12/18/20 0500 12/19/20 0953  PROCALCITON  --   --   --  24.39  --   LATICACIDVEN 4.0* 5.4* 6.1*  --  1.9    Recent Results (from the past 240 hour(s))  Blood Culture (routine x 2)     Status: Abnormal   Collection Time: 12/17/20 12:05 PM   Specimen: BLOOD  Result Value Ref Range Status   Specimen Description   Final    BLOOD LEFT CHEST PORT Performed at [facility], [address], [place], [place] [address]    Special Requests   Final    BOTTLES DRAWN AEROBIC AND ANAEROBIC Blood Culture adequate volume Performed at [facility], [address], [place], [place] [address]    Culture  Setup Time   Final    GRAM NEGATIVE RODS IN BOTH AEROBIC AND ANAEROBIC BOTTLES CRITICAL VALUE NOTED.  VALUE IS CONSISTENT WITH PREVIOUSLY REPORTED AND CALLED VALUE. Performed at [facility], [address] [place], [place] [address]    Culture (A)  Final    KLEBSIELLA PNEUMONIAE SUSCEPTIBILITIES PERFORMED ON PREVIOUS CULTURE WITHIN THE LAST 5 DAYS. Performed at [facility], [place] [address], [place], [place] [address]    Report Status 12/20/2020 FINAL  Final  Blood Culture (routine x 2)     Status: Abnormal   Collection Time: 12/17/20 12:05 PM   Specimen: BLOOD  Result Value Ref Range Status   Specimen Description   Final    BLOOD LEFT CHEST Performed at [facility], [address] [place], [place] [address]    Special Requests   Final    BOTTLES DRAWN AEROBIC AND ANAEROBIC Blood Culture adequate volume Performed at [facility], [address], [place], [place] [address]    Culture  Setup Time   Final    GRAM NEGATIVE RODS IN BOTH AEROBIC AND ANAEROBIC  BOTTLES CRITICAL RESULT CALLED TO, READ BACK BY AND VERIFIED WITH: Chase [id number] 12/18/20 HNM Performed at [facility], [place] [address] [place], [place] [address]    Culture KLEBSIELLA PNEUMONIAE (A)  Final   Report Status 12/20/2020 FINAL  Final   Organism ID, Bacteria KLEBSIELLA PNEUMONIAE  Final      Susceptibility   Klebsiella pneumoniae - MIC*    AMPICILLIN >=32 RESISTANT Resistant     CEFAZOLIN <=4 SENSITIVE Sensitive     CEFEPIME <=0.12 SENSITIVE Sensitive     CEFTAZIDIME <=1 SENSITIVE Sensitive  CEFTRIAXONE <=0.25 SENSITIVE Sensitive     CIPROFLOXACIN <=0.25 SENSITIVE Sensitive     GENTAMICIN <=1 SENSITIVE Sensitive     IMIPENEM <=0.25 SENSITIVE Sensitive     TRIMETH/SULFA <=20 SENSITIVE Sensitive     AMPICILLIN/SULBACTAM 4 SENSITIVE Sensitive     PIP/TAZO <=4 SENSITIVE Sensitive     * KLEBSIELLA PNEUMONIAE  Blood Culture ID Panel (Reflexed)     Status: Abnormal   Collection Time: 12/17/20 12:05 PM  Result Value Ref Range Status   Enterococcus faecalis NOT DETECTED NOT DETECTED Final   Enterococcus Faecium NOT DETECTED NOT DETECTED Final   Listeria monocytogenes NOT DETECTED NOT DETECTED Final   Staphylococcus species NOT DETECTED NOT DETECTED Final   Staphylococcus aureus (BCID) NOT DETECTED NOT DETECTED Final   Staphylococcus epidermidis NOT DETECTED NOT DETECTED Final   Staphylococcus lugdunensis NOT DETECTED NOT DETECTED Final   Streptococcus species NOT DETECTED NOT DETECTED Final   Streptococcus agalactiae NOT DETECTED NOT DETECTED Final   Streptococcus pneumoniae NOT DETECTED NOT DETECTED Final   Streptococcus pyogenes NOT DETECTED NOT DETECTED Final   A.calcoaceticus-baumannii NOT DETECTED NOT DETECTED Final   Bacteroides fragilis NOT DETECTED NOT DETECTED Final   Enterobacterales DETECTED (A) NOT DETECTED Final    Comment: Enterobacterales represent a large order of gram negative bacteria, not a single organism. CRITICAL RESULT CALLED TO, READ  BACK BY AND VERIFIED WITH: [place] RN [phone] 12/18/20 HNM    Enterobacter cloacae complex NOT DETECTED NOT DETECTED Final   Escherichia coli NOT DETECTED NOT DETECTED Final   Klebsiella aerogenes NOT DETECTED NOT DETECTED Final   Klebsiella oxytoca NOT DETECTED NOT DETECTED Final   Klebsiella pneumoniae DETECTED (A) NOT DETECTED Final    Comment: CRITICAL RESULT CALLED TO, READ BACK BY AND VERIFIED WITH: [place] RN [phone] 12/18/20 HNM    Proteus species NOT DETECTED NOT DETECTED Final   Salmonella species NOT DETECTED NOT DETECTED Final   Serratia marcescens NOT DETECTED NOT DETECTED Final   Haemophilus influenzae NOT DETECTED NOT DETECTED Final   Neisseria meningitidis NOT DETECTED NOT DETECTED Final   Pseudomonas aeruginosa NOT DETECTED NOT DETECTED Final   Stenotrophomonas maltophilia NOT DETECTED NOT DETECTED Final   Candida albicans NOT DETECTED NOT DETECTED Final   Candida auris NOT DETECTED NOT DETECTED Final   Candida glabrata NOT DETECTED NOT DETECTED Final   Candida krusei NOT DETECTED NOT DETECTED Final   Candida parapsilosis NOT DETECTED NOT DETECTED Final   Candida tropicalis NOT DETECTED NOT DETECTED Final   Cryptococcus neoformans/gattii NOT DETECTED NOT DETECTED Final   CTX-M ESBL NOT DETECTED NOT DETECTED Final   Carbapenem resistance IMP NOT DETECTED NOT DETECTED Final   Carbapenem resistance KPC NOT DETECTED NOT DETECTED Final   Carbapenem resistance NDM NOT DETECTED NOT DETECTED Final   Carbapenem resist OXA 48 LIKE NOT DETECTED NOT DETECTED Final   Carbapenem resistance VIM NOT DETECTED NOT DETECTED Final    Comment: Performed at [facility], [address] [place], [place] [address]  Resp Panel by RT-PCR (Flu A&B, Covid) Nasopharyngeal Swab     Status: None   Collection Time: 12/17/20 12:37 PM   Specimen: Nasopharyngeal Swab; Nasopharyngeal(NP) swabs in vial transport medium  Result Value Ref Range Status   SARS Coronavirus 2 by RT PCR NEGATIVE  NEGATIVE Final    Comment: (NOTE) SARS-CoV-2 target nucleic acids are NOT DETECTED.  The SARS-CoV-2 RNA is generally detectable in upper respiratory specimens during the acute phase of infection. The lowest concentration of SARS-CoV-2  viral copies this assay can detect is 138 copies/mL. A negative result does not preclude SARS-Cov-2 infection and should not be used as the sole basis for treatment or other patient management decisions. A negative result may occur with  improper specimen collection/handling, submission of specimen other than nasopharyngeal swab, presence of viral mutation(s) within the areas targeted by this assay, and inadequate number of viral copies(<138 copies/mL). A negative result must be combined with clinical observations, patient history, and epidemiological information. The expected result is Negative.  Fact Sheet for Patients:  EntrepreneurPulse.com.au  Fact Sheet for Healthcare Providers:  IncredibleEmployment.be  This test is no t yet approved or cleared by the [place] FDA and  has been authorized for detection and/or diagnosis of SARS-CoV-2 by FDA under an Emergency Use Authorization (EUA). This EUA will remain  in effect (meaning this test can be used) for the duration of the COVID-19 declaration under Section 564(b)(1) of the Act, 21 U.S.C.section 360bbb-3(b)(1), unless the authorization is terminated  or revoked sooner.       Influenza A by PCR NEGATIVE NEGATIVE Final   Influenza B by PCR NEGATIVE NEGATIVE Final    Comment: (NOTE) The Xpert Xpress SARS-CoV-2/FLU/RSV plus assay is intended as an aid in the diagnosis of influenza from Nasopharyngeal swab specimens and should not be used as a sole basis for treatment. Nasal washings and aspirates are unacceptable for Xpert Xpress SARS-CoV-2/FLU/RSV testing.  Fact Sheet for Patients: EntrepreneurPulse.com.au  Fact Sheet for Healthcare  Providers: IncredibleEmployment.be  This test is not yet approved or cleared by the [place] FDA and has been authorized for detection and/or diagnosis of SARS-CoV-2 by FDA under an Emergency Use Authorization (EUA). This EUA will remain in effect (meaning this test can be used) for the duration of the COVID-19 declaration under Section 564(b)(1) of the Act, 21 U.S.C. section 360bbb-3(b)(1), unless the authorization is terminated or revoked.  Performed at [facility], [address] [place], [place] [address]   Urine Culture     Status: None   Collection Time: 12/17/20  3:32 PM   Specimen: In/Out Cath Urine  Result Value Ref Range Status   Specimen Description   Final    IN/OUT CATH URINE Performed at [facility], [address], [place], [place] [address]    Special Requests   Final    NONE Performed at [facility], [address], [place], [place] [address]    Culture   Final    NO GROWTH Performed at [facility], [place] [address], [place], [place] [address]    Report Status 12/19/2020 FINAL  Final  C Difficile Quick Screen w PCR reflex     Status: None   Collection Time: 12/17/20  3:32 PM   Specimen: STOOL  Result Value Ref Range Status   C Diff antigen NEGATIVE NEGATIVE Final   C Diff toxin NEGATIVE NEGATIVE Final   C Diff interpretation No C. difficile detected.  Final    Comment: Performed at [facility], [address] [place], [place] [address]  MRSA Next Gen by PCR, Nasal     Status: Abnormal   Collection Time: 12/18/20  9:55 PM   Specimen: Nasal Mucosa; Nasal Swab  Result Value Ref Range Status   MRSA by PCR Next Gen DETECTED (A) NOT DETECTED Final    Comment: RESULT CALLED TO, READ BACK BY AND VERIFIED WITH: BETH BUONO @ 2327 12/18/20 LFD (NOTE) The GeneXpert MRSA Assay (FDA approved for NASAL  specimens only), is one component of a comprehensive MRSA colonization surveillance program.  It is not intended to diagnose MRSA infection nor to guide or monitor treatment for MRSA infections. Test performance is not FDA approved in patients less than 33 years old. Performed at [facility], [address] [place], [place] [address]          Radiology Studies: MR BRAIN W WO CONTRAST  Result Date: 12/20/2020 CLINICAL DATA:  Neuro deficit, acute, stroke suspected Small cell lung cancer, monitor EXAM: MRI HEAD WITHOUT AND WITH CONTRAST TECHNIQUE: Multiplanar, multiecho pulse sequences of the brain and surrounding structures were obtained without and with intravenous contrast. CONTRAST:  7.70mL GADAVIST GADOBUTROL 1 MMOL/ML IV SOLN COMPARISON:  CT head 12/17/2020. FINDINGS: Mildly motion limited exam.  Within this limitation: Brain: Multiple small areas of restricted diffusion in the bilateral frontal and occipital, and left parietal matter, left perirolandic cortex, left thalamocapsular region, and left cerebellum. Advanced confluent T2 hyperintensity within the white matter, nonspecific but compatible with chronic microvascular ischemic disease and progressed since 2021. Small remote infarct in the left corona radiata. No hydrocephalus, obvious mass lesion, midline shift, acute hemorrhage, or extra-axial fluid collection. Punctate foci of susceptibility artifact in the left cerebellum and left frontal white matter, nonspecific but potentially related to prior microhemorrhage. Contrast was administered; however, there is no evidence of enhancement (for example, the nasal mucosa/turbinates are nonenhancing). This precludes evaluation for enhancing lesions. Vascular: Major arterial flow voids are maintained at the skull base. Skull and upper cervical spine: Normal marrow signal. Sinuses/Orbits: Mild paranasal sinus disease. Other: Moderate right and small left mastoid effusions. IMPRESSION: 1. Multiple small areas of restricted diffusion in the bilateral frontal and occipital and left  parietal white matter, left perirolandic cortex, left thalamocapsular region, and left cerebellum, which are concerning for acute infarcts. Given involvement of multiple vascular territories, consider a central embolic etiology. 2. Contrast was administered; however, there is no evidence of enhancement, unclear why given reported uneventful injection per the technologist. This precludes evaluation for enhancing lesions. Given the patient's known small cell lung cancer diagnosis, recommend repeat postcontrast imaging to exclude metastatic disease. I've asked the MRI technologist to not bill the patient for the repeat post-contrast imaging. 3. Advanced chronic microvascular ischemic disease, progressed since 2021. 4. Moderate right and small left mastoid effusions. Electronically Signed   By: Margaretha Sheffield M.D.   On: 12/20/2020 14:29   IR Perc Cholecystostomy  Result Date: 12/20/2020 INDICATION: 70 year old male with small cell lung cancer and chemotherapy induced pancytopenia presenting with neutropenic sepsis. Blood cultures grew out Klebsiella and imaging findings are concerning for acute cholecystitis. He is too sick and frail to be considered an operative candidate. Following transfusion of platelets, his platelets are now at an acceptable level to proceed with percutaneous cholecystostomy tube placement. EXAM: CHOLECYSTOSTOMY MEDICATIONS: In patient currently receiving intravenous antibiotic therapy. No additional antibiotic prophylaxis administered. ANESTHESIA/SEDATION: Moderate (conscious) sedation was employed during this procedure. A total of Versed 1 mg and Fentanyl 50 mcg and 0.5 mg Dilaudid administered intravenously. Moderate Sedation Time: 13 minutes. The patient's level of consciousness and vital signs were monitored continuously by radiology nursing throughout the procedure under my direct supervision. FLUOROSCOPY TIME:  Fluoroscopy Time: 0 minutes 30 seconds (4.3 mGy). COMPLICATIONS: None  immediate. PROCEDURE: Informed written consent was obtained from the patient after a thorough discussion of the procedural risks, benefits and alternatives. All questions were addressed. Maximal Sterile Barrier Technique was utilized including caps, mask, sterile gowns, sterile  gloves, sterile drape, hand hygiene and skin antiseptic. A timeout was performed prior to the initiation of the procedure. The right upper quadrant was interrogated with ultrasound. The distended gallbladder with a thickened wall was successfully identified. There is significant excursion of the liver with respiration due to use of abdominal muscles. A suitable skin entry site was selected. Local anesthesia was attained by infiltration with 1% lidocaine. A small dermatotomy was made. Under real-time ultrasound guidance, the movement of the liver was timed with respirations and in a single pass, a 21 gauge Accustick needle was advanced through the liver and into the gallbladder lumen along a short transhepatic course. A 0.018 wire was quickly advanced into the gallbladder lumen and the needle was exchanged for the transitional dilator. The transitional dilator was advanced into the gallbladder lumen. The wire was removed. Contrast injection was performed under fluoroscopy confirming opacification of the gallbladder lumen. Multiple filling defects consistent with cholelithiasis. The cystic duct is occluded. A 0.035 wire was then coiled within the gallbladder lumen. The transitional dilator was removed. The transhepatic tract was dilated to 10 [place] with a stiff dilator. A Cook 10.2 [place] all-purpose drainage catheter was then advanced over the wire and formed in the gallbladder lumen. Images were obtained and stored for the medical record. The catheter was gently flushed and connected to gravity bag drainage before being secured to the skin with 0 silk suture. IMPRESSION: Successful placement of 10 French transhepatic percutaneous  cholecystostomy tube for an indication of calculus cholecystitis. PLAN: Maintain tube to gravity bag drainage. Patient will likely need home health to assist with care and cleaning of the tube site. Return to interventional radiology approximately every 8 weeks for cholecystostomy tube check and exchange. If patient's clinical status improves in the future, elective cholecystectomy would be optimal. If patient remains a poor operative candidate, long-term cholecystostomy tube maintenance will likely be required. Electronically Signed   By: Jacqulynn Cadet M.D.   On: 12/20/2020 13:50        Scheduled Meds:  (feeding supplement) PROSource Plus  30 mL Oral BID BM   sodium chloride   Intravenous Once   sodium chloride   Intravenous Once   chlorhexidine  10 mL Mouth/Throat BID   Chlorhexidine Gluconate Cloth  6 each Topical Daily   diltiazem  240 mg Oral Daily   dronabinol  2.5 mg Oral BID AC   feeding supplement  1 Container Oral TID BM   feeding supplement  237 mL Oral TID BM   folic acid  1 mg Oral Daily   multivitamin with minerals  1 tablet Oral Daily   mupirocin ointment  1 application Nasal BID   phosphorus  500 mg Oral Q4H   sodium chloride flush  5 mL Intracatheter Q8H   thiamine  100 mg Oral Daily   Continuous Infusions:  sodium chloride Stopped (12/20/20 1551)   sodium chloride 10 mL/hr (12/20/20 1220)   0.9 % NaCl with KCl 20 mEq / L 50 mL/hr at 12/21/20 1344   cefTRIAXone (ROCEPHIN)  IV Stopped (12/21/20 0509)   metronidazole 500 mg (12/21/20 1349)     LOS: 4 days    Time spent: 9 minutes    Judah Chevere Manuella Ghazi, MD [facility]   To contact the attending provider between 7A-7P or the covering provider during after hours 7P-7A, please log into the web site [email or web address] and access using universal [place] password for that web site. If you do not have the password, please call the  hospital operator.  12/21/2020, 2:36 PM

## 2020-12-22 ENCOUNTER — Encounter: Payer: Self-pay | Admitting: Oncology

## 2020-12-22 DIAGNOSIS — D696 Thrombocytopenia, unspecified: Secondary | ICD-10-CM | POA: Diagnosis not present

## 2020-12-22 DIAGNOSIS — C7951 Secondary malignant neoplasm of bone: Secondary | ICD-10-CM | POA: Diagnosis not present

## 2020-12-22 DIAGNOSIS — D6481 Anemia due to antineoplastic chemotherapy: Secondary | ICD-10-CM | POA: Diagnosis not present

## 2020-12-22 DIAGNOSIS — D701 Agranulocytosis secondary to cancer chemotherapy: Secondary | ICD-10-CM | POA: Diagnosis not present

## 2020-12-22 DIAGNOSIS — F1010 Alcohol abuse, uncomplicated: Secondary | ICD-10-CM | POA: Diagnosis not present

## 2020-12-22 DIAGNOSIS — A419 Sepsis, unspecified organism: Secondary | ICD-10-CM | POA: Diagnosis not present

## 2020-12-22 DIAGNOSIS — I6310 Cerebral infarction due to embolism of unspecified precerebral artery: Secondary | ICD-10-CM | POA: Diagnosis not present

## 2020-12-22 LAB — BASIC METABOLIC PANEL
Anion gap: 8 (ref 5–15)
BUN: 21 mg/dL (ref 8–23)
CO2: 23 mmol/L (ref 22–32)
Calcium: 7.9 mg/dL — ABNORMAL LOW (ref 8.9–10.3)
Chloride: 110 mmol/L (ref 98–111)
Creatinine, Ser: 0.59 mg/dL — ABNORMAL LOW (ref 0.61–1.24)
GFR, Estimated: >60 mL/min (ref >60)
Glucose, Bld: 115 mg/dL — ABNORMAL HIGH (ref 70–99)
Potassium: 3.5 mmol/L (ref 3.5–5.1)
Sodium: 141 mmol/L (ref 135–145)

## 2020-12-22 LAB — CBC
HCT: 25 % — ABNORMAL LOW (ref 39.0–52.0)
Hemoglobin: 9.1 g/dL — ABNORMAL LOW (ref 13.0–17.0)
MCH: 35 pg — ABNORMAL HIGH (ref 26.0–34.0)
MCHC: 36.4 g/dL — ABNORMAL HIGH (ref 30.0–36.0)
MCV: 96.2 fL (ref 80.0–100.0)
Platelets: 51 10*3/uL — ABNORMAL LOW (ref 150–400)
RBC: 2.6 MIL/uL — ABNORMAL LOW (ref 4.22–5.81)
RDW: 13.7 % (ref 11.5–15.5)
WBC: 9.7 10*3/uL (ref 4.0–10.5)
nRBC: 0.9 % — ABNORMAL HIGH (ref 0.0–0.2)

## 2020-12-22 MED ORDER — ASPIRIN 81 MG PO CHEW
81.0000 mg | CHEWABLE_TABLET | Freq: Every day | ORAL | Status: DC
  Administered 2020-12-22 – 2020-12-24 (×3): 81 mg via ORAL
  Filled 2020-12-22 (×4): qty 1

## 2020-12-22 NOTE — Plan of Care (Signed)
  Problem: Fluid Volume: Goal: Hemodynamic stability will improve Outcome: Progressing   Problem: Clinical Measurements: Goal: Diagnostic test results will improve Outcome: Progressing Goal: Signs and symptoms of infection will decrease Outcome: Progressing   Problem: Respiratory: Goal: Ability to maintain adequate ventilation will improve Outcome: Progressing   Problem: Education: Goal: Knowledge of General Education information will improve Description: Including pain rating scale, medication(s)/side effects and non-pharmacologic comfort measures Outcome: Progressing   Problem: Health Behavior/Discharge Planning: Goal: Ability to manage health-related needs will improve Outcome: Progressing   Problem: Clinical Measurements: Goal: Ability to maintain clinical measurements within normal limits will improve Outcome: Progressing Goal: Will remain free from infection Outcome: Progressing Goal: Diagnostic test results will improve Outcome: Progressing Goal: Respiratory complications will improve Outcome: Progressing Goal: Cardiovascular complication will be avoided Outcome: Progressing   Problem: Activity: Goal: Risk for activity intolerance will decrease Outcome: Progressing   Problem: Nutrition: Goal: Adequate nutrition will be maintained Outcome: Not Progressing   Problem: Coping: Goal: Level of anxiety will decrease Outcome: Progressing   Problem: Elimination: Goal: Will not experience complications related to bowel motility Outcome: Progressing Goal: Will not experience complications related to urinary retention Outcome: Progressing   Problem: Pain Managment: Goal: General experience of comfort will improve Outcome: Progressing   Problem: Safety: Goal: Ability to remain free from injury will improve Outcome: Progressing   Problem: Skin Integrity: Goal: Risk for impaired skin integrity will decrease Outcome: Progressing

## 2020-12-22 NOTE — Progress Notes (Signed)
[facility] Encounter Note  Patient: Scott Jordan / Admit Date: 12/17/2020 / Date of Encounter: 12/22/2020, 10:19 PM   Subjective: No significant changes overall since yesterday.  The patient still has some residual strokelike issues and very concerned about multiple scattered thromboemboli.  Transit esophageal echocardiogram has been deferred at this time due to concerns of aspiration and/or other strokelike issues.  Surface echocardiogram with good imaging showed no evidence of significant valvular heart disease or probability of large vegetations.  Ejection fraction 60%.  Highest likelihood is atrial fibrillation with thromboemboli for which the patient currently is unable to be anticoagulation although there has been suggestion that with elevated platelets can use antiplatelet medication management.  We have discussed at length with her power of Attorney and niece about these issues and has been informed and understands  Review of Systems: Positive for: None Negative for: Vision change, hearing change, syncope, dizziness, nausea, vomiting,diarrhea, bloody stool, stomach pain, cough, congestion, diaphoresis, urinary frequency, urinary pain,skin lesions, skin rashes Others previously listed  Objective: Telemetry: Atrial fibrillation Physical Exam: Blood pressure 139/77, pulse 98, temperature 98.2 F (36.8 C), temperature source Oral, resp. rate 20, height 6' (1.829 m), weight 62.2 kg, SpO2 96 %. Body mass index is 18.6 kg/m. General: Well developed, well nourished, in no acute distress. Head: Normocephalic, atraumatic, sclera non-icteric, no xanthomas, nares are without discharge. Neck: No apparent masses Lungs: Normal respirations with no wheezes, no rhonchi, no rales , no crackles   Heart: Irregular rate and rhythm, normal S1 S2, no murmur, no rub, no gallop, PMI is normal size and placement, carotid upstroke normal without bruit, jugular venous pressure  normal Abdomen: Soft, non-tender, non-distended with normoactive bowel sounds. No hepatosplenomegaly. Abdominal aorta is normal size without bruit Extremities: Trace edema, no clubbing, no cyanosis, no ulcers,  Peripheral: 2+ radial, 2+ femoral, 2+ dorsal pedal pulses Neuro: Alert and oriented. Moves all extremities spontaneously.  With neurologic abnormality Psych:  Responds to questions appropriately with a normal affect.   Intake/Output Summary (Last 24 hours) at 12/22/2020 2219 Last data filed at 12/22/2020 1853 Gross per 24 hour  Intake 0 ml  Output 700 ml  Net -700 ml    Inpatient Medications:  . (feeding supplement) PROSource Plus  30 mL Oral BID BM  . sodium chloride   Intravenous Once  . sodium chloride   Intravenous Once  . aspirin  81 mg Oral Daily  . chlorhexidine  10 mL Mouth/Throat BID  . Chlorhexidine Gluconate Cloth  6 each Topical Daily  . diltiazem  240 mg Oral Daily  . dronabinol  2.5 mg Oral BID AC  . feeding supplement  1 Container Oral TID BM  . feeding supplement  237 mL Oral TID BM  . folic acid  1 mg Oral Daily  . multivitamin with minerals  1 tablet Oral Daily  . mupirocin ointment  1 application Nasal BID  . sodium chloride flush  10-40 mL Intracatheter Q12H  . sodium chloride flush  5 mL Intracatheter Q8H  . thiamine  100 mg Oral Daily   Infusions:  . sodium chloride Stopped (12/20/20 1551)  . sodium chloride 10 mL/hr (12/20/20 1220)  . 0.9 % NaCl with KCl 20 mEq / L 50 mL/hr at 12/21/20 1835  . cefTRIAXone (ROCEPHIN)  IV 2 g (12/22/20 0542)  . metronidazole 500 mg (12/22/20 2118)    Labs: Recent Labs    12/20/20 0500 12/21/20 0434 12/22/20 0513  NA 137 141 141  K  3.4* 3.6 3.5  CL 107 111 110  CO2 22 22 23   GLUCOSE 112* 99 115*  BUN 36* 30* 21  CREATININE 0.72 0.57* 0.59*  CALCIUM 7.6* 7.8* 7.9*  MG 2.1 2.0  --   PHOS  --  1.6*  --    No results for input(s): AST, ALT, ALKPHOS, BILITOT, PROT, ALBUMIN in the last 72 hours. Recent  Labs    12/20/20 0500 12/21/20 0434 12/22/20 0513  WBC 1.7* 5.3 9.7  NEUTROABS 0.7* 3.5  --   HGB 8.9* 8.8* 9.1*  HCT 24.0* 24.9* 25.0*  MCV 94.5 97.6 96.2  PLT 42* 41* 51*   No results for input(s): CKTOTAL, CKMB, TROPONINI in the last 72 hours. Invalid input(s): POCBNP Recent Labs    12/21/20 0434  HGBA1C 5.4     Weights: Filed Weights   12/17/20 1203 12/18/20 2130  Weight: 61.7 kg 62.2 kg     Radiology/Studies:  CT ABDOMEN PELVIS WO CONTRAST  Result Date: 12/17/2020 CLINICAL DATA:  Nausea and vomiting EXAM: CT ABDOMEN AND PELVIS WITHOUT CONTRAST TECHNIQUE: Multidetector CT imaging of the abdomen and pelvis was performed following the standard protocol without IV contrast. COMPARISON:  CT 06/23/2020, PET CT 11/19/2020 FINDINGS: Lower chest: Lung bases demonstrate emphysema. Subpleural bandlike airspace disease at the left lower lobe redemonstrated. Incompletely visualized subpleural peripheral left upper lobe nodule measuring 11 mm, series 5, image 1. Cardiomegaly with trace pericardial effusion. Coronary vascular calcification. Hepatobiliary: Extensive motion degradation. No focal hepatic abnormality. Gallstones. Gallbladder slightly distended. Possible wall thickening or pericholecystic fluid though limited by motion Pancreas: Unremarkable. No pancreatic ductal dilatation or surrounding inflammatory changes. Spleen: Normal in size without focal abnormality. Adrenals/Urinary Tract: Thickened appearance of adrenal glands. No hydronephrosis. The bladder is unremarkable Stomach/Bowel: Stomach nonenlarged. No dilated small bowel. Negative appendix. No acute bowel wall thickening Vascular/Lymphatic: Advanced aortic atherosclerosis. No aneurysm. Slightly increased multiple small retroperitoneal lymph nodes. Reproductive: Prostate is unremarkable. Other: Negative for free air or free fluid Musculoskeletal: No acute osseous abnormality. IMPRESSION: 1. Degraded by motion. 2. Gallbladder  appears slightly distended and there are gallstones. Possible gallbladder wall thickening or pericholecystic fluid though limited by motion, consider correlation with ultrasound 3. Cardiomegaly with trace pericardial effusion. 4. Similar subpleural bandlike density at left lower lobe. Incompletely visualized peripheral left upper lobe lung nodule characterized on recent PET CT Electronically Signed   By: Donavan Foil M.D.   On: 12/17/2020 16:50   CT ANGIO HEAD NECK W WO CM  Result Date: 12/21/2020 CLINICAL DATA:  Neuro deficit, acute, stroke suspected. Neutropenic sepsis. EXAM: CT ANGIOGRAPHY HEAD AND NECK TECHNIQUE: Multidetector CT imaging of the head and neck was performed using the standard protocol during bolus administration of intravenous contrast. Multiplanar CT image reconstructions and MIPs were obtained to evaluate the vascular anatomy. Carotid stenosis measurements (when applicable) are obtained utilizing NASCET criteria, using the distal internal carotid diameter as the denominator. CONTRAST:  62mL OMNIPAQUE IOHEXOL 350 MG/ML SOLN COMPARISON:  MRI of the brain December 20, 2020. FINDINGS: CT HEAD FINDINGS Brain: Confluent hypodensity of the white matter of the cerebral hemispheres, nonspecific, most likely related to chronic small vessel ischemia. Foci of acute infarct are more conspicuous on recent MRI of the brain. No new large territorial infarct. No hemorrhage, hydrocephalus, mass or midline shift. Vascular: No hyperdense vessel or unexpected calcification. Skull: Normal. Negative for fracture or focal lesion. Sinuses: Increased thickness of the walls of the bilateral maxillary sinuses with bubbly secretion on the left. Findings are suggestive  of chronic sinusitis. Right mastoid effusion. Orbits: No acute finding. Review of the MIP images confirms the above findings CTA NECK FINDINGS Aortic arch: Standard branching. Imaged portion shows no evidence of aneurysm or dissection. Mild  atherosclerotic changes of the aortic arch without significant stenosis of the major arch vessel origins. Right carotid system: Calcified atherosclerotic disease of the right carotid bifurcation without hemodynamically significant stenosis. Left carotid system: Atherosclerotic disease of the left carotid bifurcation with mixed density plaques without hemodynamically significant stenosis. Vertebral arteries: Calcified atherosclerotic plaque at the origin of the right vertebral artery without hemodynamically significant stenosis. Mild atherosclerotic changes at the origin of the left vertebral artery without hemodynamically significant stenosis. Cervical vertebral arteries otherwise have normal caliber Skeleton: Degenerative changes of the cervical spine with fusion of the facet joints at C2-3 on the left. No acute findings. Other neck: Negative. Upper chest: Left-sided pleural effusion and atelectasis. Mediastinal lymphadenopathy. Review of the MIP images confirms the above findings CTA HEAD FINDINGS Anterior circulation: Calcified atherosclerotic plaques in the bilateral carotid siphons. No significant stenosis, proximal occlusion, aneurysm, or vascular malformation. Posterior circulation: No significant stenosis, proximal occlusion, aneurysm, or vascular malformation. Hypoplastic/aplastic right P1/PCA segment with right fetal PCA. Venous sinuses: As permitted by contrast timing, patent. Anatomic variants: Right fetal PCA. Review of the MIP images confirms the above findings IMPRESSION: 1. Advanced chronic microvascular ischemic changes of the white matter. 2. Atherosclerotic disease of the bilateral carotid bifurcation without hemodynamically significant stenosis. 3. Mild atherosclerotic changes at the origin of the bilateral vertebral arteries without hemodynamically significant stenosis. 4. Mild intracranial atherosclerotic disease without hemodynamically significant stenosis. Electronically Signed   By: Pedro Earls M.D.   On: 12/21/2020 15:32   CT HEAD WO CONTRAST (5MM)  Result Date: 12/17/2020 CLINICAL DATA:  Nausea vomiting EXAM: CT HEAD WITHOUT CONTRAST TECHNIQUE: Contiguous axial images were obtained from the base of the skull through the vertex without intravenous contrast. COMPARISON:  CT brain 06/24/2020, MRI 03/10/2020, head CT 11/22/2019 FINDINGS: Brain: No acute territorial infarction, hemorrhage or intracranial mass. Extensive white matter disease, progressed from more remote exams from 2021. Stable ventricle size. Chronic lacunar infarcts within the left white matter. Vascular: No hyperdense vessel.  Carotid vascular calcification Skull: Normal. Negative for fracture or focal lesion. Sinuses/Orbits: Mucosal thickening the sinuses. Chronic appearing nasal deformity Other: None IMPRESSION: 1. No definite CT evidence for acute intracranial abnormality. 2. Atrophy. Extensive white matter disease, grossly stable as compared with recent head CT from March, appears progressive when compared to exams from 2021 and prior Electronically Signed   By: Donavan Foil M.D.   On: 12/17/2020 16:39   MR BRAIN W WO CONTRAST  Addendum Date: 12/21/2020   ADDENDUM REPORT: 12/21/2020 14:49 ADDENDUM: Patient returns for repeat postcontrast imaging. There is no abnormal enhancement. No evidence of intracranial metastatic disease. Electronically Signed   By: Macy Mis M.D.   On: 12/21/2020 14:49   Result Date: 12/21/2020 CLINICAL DATA:  Neuro deficit, acute, stroke suspected Small cell lung cancer, monitor EXAM: MRI HEAD WITHOUT AND WITH CONTRAST TECHNIQUE: Multiplanar, multiecho pulse sequences of the brain and surrounding structures were obtained without and with intravenous contrast. CONTRAST:  7.37mL GADAVIST GADOBUTROL 1 MMOL/ML IV SOLN COMPARISON:  CT head 12/17/2020. FINDINGS: Mildly motion limited exam.  Within this limitation: Brain: Multiple small areas of restricted diffusion in the bilateral  frontal and occipital, and left parietal matter, left perirolandic cortex, left thalamocapsular region, and left cerebellum. Advanced confluent T2 hyperintensity within the  white matter, nonspecific but compatible with chronic microvascular ischemic disease and progressed since 2021. Small remote infarct in the left corona radiata. No hydrocephalus, obvious mass lesion, midline shift, acute hemorrhage, or extra-axial fluid collection. Punctate foci of susceptibility artifact in the left cerebellum and left frontal white matter, nonspecific but potentially related to prior microhemorrhage. Contrast was administered; however, there is no evidence of enhancement (for example, the nasal mucosa/turbinates are nonenhancing). This precludes evaluation for enhancing lesions. Vascular: Major arterial flow voids are maintained at the skull base. Skull and upper cervical spine: Normal marrow signal. Sinuses/Orbits: Mild paranasal sinus disease. Other: Moderate right and small left mastoid effusions. IMPRESSION: 1. Multiple small areas of restricted diffusion in the bilateral frontal and occipital and left parietal white matter, left perirolandic cortex, left thalamocapsular region, and left cerebellum, which are concerning for acute infarcts. Given involvement of multiple vascular territories, consider a central embolic etiology. 2. Contrast was administered; however, there is no evidence of enhancement, unclear why given reported uneventful injection per the technologist. This precludes evaluation for enhancing lesions. Given the patient's known small cell lung cancer diagnosis, recommend repeat postcontrast imaging to exclude metastatic disease. I've asked the MRI technologist to not bill the patient for the repeat post-contrast imaging. 3. Advanced chronic microvascular ischemic disease, progressed since 2021. 4. Moderate right and small left mastoid effusions. Electronically Signed: By: Margaretha Sheffield M.D. On:  12/20/2020 14:29   IR Perc Cholecystostomy  Result Date: 12/20/2020 INDICATION: 71 year old male with small cell lung cancer and chemotherapy induced pancytopenia presenting with neutropenic sepsis. Blood cultures grew out Klebsiella and imaging findings are concerning for acute cholecystitis. He is too sick and frail to be considered an operative candidate. Following transfusion of platelets, his platelets are now at an acceptable level to proceed with percutaneous cholecystostomy tube placement. EXAM: CHOLECYSTOSTOMY MEDICATIONS: In patient currently receiving intravenous antibiotic therapy. No additional antibiotic prophylaxis administered. ANESTHESIA/SEDATION: Moderate (conscious) sedation was employed during this procedure. A total of Versed 1 mg and Fentanyl 50 mcg and 0.5 mg Dilaudid administered intravenously. Moderate Sedation Time: 13 minutes. The patient's level of consciousness and vital signs were monitored continuously by radiology nursing throughout the procedure under my direct supervision. FLUOROSCOPY TIME:  Fluoroscopy Time: 0 minutes 30 seconds (4.3 mGy). COMPLICATIONS: None immediate. PROCEDURE: Informed written consent was obtained from the patient after a thorough discussion of the procedural risks, benefits and alternatives. All questions were addressed. Maximal Sterile Barrier Technique was utilized including caps, mask, sterile gowns, sterile gloves, sterile drape, hand hygiene and skin antiseptic. A timeout was performed prior to the initiation of the procedure. The right upper quadrant was interrogated with ultrasound. The distended gallbladder with a thickened wall was successfully identified. There is significant excursion of the liver with respiration due to use of abdominal muscles. A suitable skin entry site was selected. Local anesthesia was attained by infiltration with 1% lidocaine. A small dermatotomy was made. Under real-time ultrasound guidance, the movement of the liver was  timed with respirations and in a single pass, a 21 gauge Accustick needle was advanced through the liver and into the gallbladder lumen along a short transhepatic course. A 0.018 wire was quickly advanced into the gallbladder lumen and the needle was exchanged for the transitional dilator. The transitional dilator was advanced into the gallbladder lumen. The wire was removed. Contrast injection was performed under fluoroscopy confirming opacification of the gallbladder lumen. Multiple filling defects consistent with cholelithiasis. The cystic duct is occluded. A 0.035 wire was then coiled  within the gallbladder lumen. The transitional dilator was removed. The transhepatic tract was dilated to 10 [place] with a stiff dilator. A Cook 10.2 [place] all-purpose drainage catheter was then advanced over the wire and formed in the gallbladder lumen. Images were obtained and stored for the medical record. The catheter was gently flushed and connected to gravity bag drainage before being secured to the skin with 0 silk suture. IMPRESSION: Successful placement of 10 French transhepatic percutaneous cholecystostomy tube for an indication of calculus cholecystitis. PLAN: Maintain tube to gravity bag drainage. Patient will likely need home health to assist with care and cleaning of the tube site. Return to interventional radiology approximately every 8 weeks for cholecystostomy tube check and exchange. If patient's clinical status improves in the future, elective cholecystectomy would be optimal. If patient remains a poor operative candidate, long-term cholecystostomy tube maintenance will likely be required. Electronically Signed   By: Jacqulynn Cadet M.D.   On: 12/20/2020 13:50   DG Chest Port 1 View  Result Date: 12/17/2020 CLINICAL DATA:  Questionable sepsis.  Weakness EXAM: PORTABLE CHEST 1 VIEW COMPARISON:  03/09/2020 FINDINGS: Left subclavian approached chest port remains in place. Stable cardiomegaly. Chronic left  basilar scarring or atelectasis. Peripheral left upper lobe nodule better seen on prior CT. No new airspace consolidation. Pleural effusion or pneumothorax. IMPRESSION: Chronic left basilar scarring or atelectasis. No new or acute findings. Electronically Signed   By: Davina Poke D.O.   On: 12/17/2020 13:20   ECHOCARDIOGRAM COMPLETE  Result Date: 12/21/2020    ECHOCARDIOGRAM REPORT   Patient Name:   Scott Jordan Date of Exam: 12/21/2020 Medical Rec #:  [id number]    Height:       72.0 in Accession #:    [id number]   Weight:       137.1 lb Date of Birth:  [birth year]     BSA:          1.815 m Patient Age:    70 years     BP:           114/80 mmHg Patient Gender: M            HR:           89 bpm. Exam Location:  [facility] Procedure: 2D Echo, Color Doppler and Cardiac Doppler Indications:     Endocarditis I38  History:         Patient has prior history of Echocardiogram examinations, most                  recent 08/19/2019. Arrythmias:Atrial Fibrillation; Risk                  Factors:Hypertension. Substance abuse.  Sonographer:     Sherrie Sport Referring Phys:  [place] Diagnosing Phys: Serafina Royals MD IMPRESSIONS  1. Left ventricular ejection fraction, by estimation, is 60 to 65%. The left ventricle has normal function. The left ventricle has no regional wall motion abnormalities. Left ventricular diastolic parameters were normal.  2. Right ventricular systolic function is normal. The right ventricular size is normal.  3. The mitral valve is normal in structure. Trivial mitral valve regurgitation.  4. The aortic valve is normal in structure. Aortic valve regurgitation is not visualized. Mild aortic valve sclerosis is present, with no evidence of aortic valve stenosis. Conclusion(s)/Recommendation(s): No evidence of valve vegetations. FINDINGS  Left Ventricle: Left ventricular ejection fraction, by estimation, is 60 to 65%. The left ventricle has normal function. The left  ventricle has no regional wall motion  abnormalities. The left ventricular internal cavity size was small. There is no left ventricular hypertrophy. Left ventricular diastolic parameters were normal. Right Ventricle: The right ventricular size is normal. No increase in right ventricular wall thickness. Right ventricular systolic function is normal. Left Atrium: Left atrial size was normal in size. Right Atrium: Right atrial size was normal in size. Pericardium: There is no evidence of pericardial effusion. Mitral Valve: The mitral valve is normal in structure. Trivial mitral valve regurgitation. Tricuspid Valve: The tricuspid valve is normal in structure. Tricuspid valve regurgitation is trivial. Aortic Valve: The aortic valve is normal in structure. Aortic valve regurgitation is not visualized. Mild aortic valve sclerosis is present, with no evidence of aortic valve stenosis. Aortic valve mean gradient measures 3.0 mmHg. Aortic valve peak gradient measures 5.9 mmHg. Aortic valve area, by VTI measures 2.30 cm. Pulmonic Valve: The pulmonic valve was normal in structure. Pulmonic valve regurgitation is not visualized. Aorta: The aortic root and ascending aorta are structurally normal, with no evidence of dilitation. IAS/Shunts: No atrial level shunt detected by color flow Doppler.  LEFT VENTRICLE PLAX 2D LVIDd:         3.87 cm LVIDs:         2.30 cm LV PW:         1.00 cm LV IVS:        1.18 cm LVOT diam:     2.10 cm LV SV:         44 LV SV Index:   24 LVOT Area:     3.46 cm  RIGHT VENTRICLE RV Basal diam:  3.70 cm RV S prime:     12.40 cm/s TAPSE (M-mode): 3.7 cm LEFT ATRIUM           Index       RIGHT ATRIUM           Index LA diam:      3.50 cm 1.93 cm/m  RA Area:     33.70 cm LA Vol (A2C): 57.4 ml 31.63 ml/m RA Volume:   128.00 ml 70.53 ml/m LA Vol (A4C): 72.1 ml 39.73 ml/m  AORTIC VALVE                   PULMONIC VALVE AV Area (Vmax):    2.34 cm    PV Vmax:        0.60 m/s AV Area (Vmean):   2.32 cm    PV Peak grad:   1.4 mmHg AV Area (VTI):      2.30 cm    RVOT Peak grad: 2 mmHg AV Vmax:           121.00 cm/s AV Vmean:          86.000 cm/s AV VTI:            0.193 m AV Peak Grad:      5.9 mmHg AV Mean Grad:      3.0 mmHg LVOT Vmax:         81.60 cm/s LVOT Vmean:        57.600 cm/s LVOT VTI:          0.128 m LVOT/AV VTI ratio: 0.66  AORTA Ao Root diam: 3.50 cm MITRAL VALVE               TRICUSPID VALVE MV Area (PHT): 4.52 cm    TR Peak grad:   24.4 mmHg MV Decel Time: 168 msec  TR Vmax:        247.00 cm/s MV E velocity: 88.30 cm/s                            SHUNTS                            Systemic VTI:  0.13 m                            Systemic Diam: 2.10 cm Serafina Royals MD Electronically signed by Serafina Royals MD Signature Date/Time: 12/21/2020/5:20:09 PM    Final    US Abdomen Limited RUQ (LIVER/GB)  Result Date: 12/18/2020 CLINICAL DATA:  Evaluate for cholecystitis. EXAM: ULTRASOUND ABDOMEN LIMITED RIGHT UPPER QUADRANT COMPARISON:  CT AP from 12/17/2020 FINDINGS: Gallbladder: The gallbladder wall is diffusely edematous measuring up to 13.1 mm. Gallstones are noted measuring up to 5.9 mm. Pericholecystic fluid. Positive sonographic Murphy's sign. Common bile duct: Diameter: 6 mm Liver: Diffusely increased parenchymal echogenicity identified. No focal liver abnormality. Portal vein is patent on color Doppler imaging with normal direction of blood flow towards the liver. Other: 1.9 cm right kidney cyst. IMPRESSION: Gallstones, gallbladder wall thickening and pericholecystic fluid with positive sonographic Murphy's sign. Findings are compatible with acute cholecystitis. Electronically Signed   By: Kerby Moors M.D.   On: 12/18/2020 14:18     Assessment and Recommendation  70 y.o. male with small cell lung cancer anemia low platelets with Klebsiella pneumonia and bacteremia and MRI showing multiple infarcts most consistent with thromboembolic abnormality based on above rather than endocarditis. 1.  Antiplatelet medication management for  risk reduction and stroke 2.  Anticoagulation with Eliquis 5 mg twice per day when appropriate due to concerns of anemia and thrombocytopenia for further treatment options of atrial fibrillation and multiple shower emboli 3.  Continue supportive care for significant concerns of Klebsiella pneumonia and bacteremia as before 4.  Defer transesophageal echocardiogram at this time due to concerns of above  Signed, Serafina Royals M.D. FACC

## 2020-12-22 NOTE — Plan of Care (Signed)
Neurology Plan of Care  Please see full consult note from yesterday evening.  Results review  CTA H&N  1. Advanced chronic microvascular ischemic changes of the white matter. 2. Atherosclerotic disease of the bilateral carotid bifurcation without hemodynamically significant stenosis. 3. Mild atherosclerotic changes at the origin of the bilateral vertebral arteries without hemodynamically significant stenosis. 4. Mild intracranial atherosclerotic disease without hemodynamically significant stenosis.  TTE: no intracardiac clot, no e/o valve vegetations  Updated recommendations:  - Per oncology, platelets 51 today and expected to improve over next couple of days. Will start ASA 81mg  daily. - Per cardiology, no e/o on TTE to suggest vegetation or valvular abnl. Agree stroke is likely thromboembolic, although 2/2 thrombocytopenia patient not candidate for anticoagulation at this time. Furthermore they do not recommend TEE at this time 2/2 concern for aspiration given recent stroke. I am in agreement, will defer consideration of TEE in the future to cardiology or outpatient neurologist. - No indication for statin for LDL 37.8 - I will place amb referral to neurology to arrange o/p f/u. He can be reassessed at that point for suitability to resume his eliquis for a flutter and likely embolic stroke. - Continue PT/OT/SLP  No further neurologic w/u indicated at this time. Neurology to sign off, but please re-engage if additional questions arise.   Su Monks, MD [facility] [phone]  If 7pm- 7am, please page neurology on call as listed in [place].

## 2020-12-22 NOTE — Progress Notes (Signed)
PROGRESS NOTE    Scott Jordan  IWL:[id number] DOB: 02-12-1951 DOA: 12/17/2020 PCP: Earlie Server, MD   Chief complaint.  Nausea vomiting. Brief Narrative:  Scott Jordan is a 70 y.o. 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  Upon arriving the hospital, patient was found to be septic with elevated respirate, tachycardia and severe neutropenia.  She was placed on broad-spectrum antibiotics.  Blood culture came back positive for Klebsiella, antibiotic switched to Rocephin. Right upper quadrant ultrasound performed on 9/10 confirmed cholelithiasis with cholecystitis.  IR guided cholecystostomy On 9/11  Assessment & Plan:   Principal Problem:   Neutropenic sepsis ([place]) Active Problems:   Protein-calorie malnutrition, severe   Small cell lung cancer ([place])   Bone metastasis ([place])   Chemotherapy-induced neutropenia (HCC)   Antineoplastic chemotherapy induced anemia   Thrombocytopenia (HCC)   Atypical atrial flutter (HCC)   Chronic anticoagulation   Alcohol abuse   History of substance abuse ([place])   Hypertension, essential   B12 deficiency   Septicemia due to Klebsiella pneumoniae ([place])   Cholecystitis   Cholelithiasis with acute cholecystitis   Cerebrovascular accident (CVA) due to embolism of precerebral artery ([place])   Palliative care encounter  Neutropenic sepsis. Septicemia secondary to Klebsiella pneumoniae. Septic endocarditis? Klebsiella pneumonia ruled out. Cholelithiasis with cholecystitis secondary to Klebsiella pneumoniae. S/p IR guided cholecystostomy on 9/11 Continue  Rocephin and IV Flagyl to cover anaerobes considering gallbladder is likely source -monitor his port.  Currently does not look infected. - 2D echo shows no vegetation.  Cardiology and neurology in agreement to hold off TEE at this point as this seems more thromboembolic in nature.  Is at high risk patient for TEE considering thrombocytopenia. -We will repeat blood cultures today to see clearance of infection -Surgery has signed off as no plan for cholecystectomy on this admission.  Outpatient follow-up in 6 to 8 weeks requested by them  Small cell lung cancer. Pancytopenia related to chemotherapy. S/p 3 units of platelets this admission with improvement of platelet count to 51,000 today. Oncology following  Acute thromboembolic stroke with new right-sided weakness.   Confirmed on MRI on 9/11  Neurology input appreciated.  No need of statin due to LDL of 37.8 -Started aspirin 81 mg p.o. daily.  Neurology recommends outpatient neurology follow-up to decide switch to Eliquis  Paroxysmal atrial fibrillation. Not on anticoagulation due to severe thrombocytopenia. Started aspirin 81 mg once daily.  Cardizem CD 240 mg once daily for rate control  Severe protein calorie malnutrition Anorexia BMI 18.6 Due to malignancy, chemo On Marinol and protein supplements  AKI Hypokalemia/hyponatremia Resolved with hydration.  Likely prerenal.  Electrolytes replaced  Alcohol use disorder No withdrawal signs or symptoms  Anemia of chronic kidney disease Hemoglobin 9.1, no active bleeding.  Monitor H&H  Goals of care With multiple medical comorbidities including extensive small cell lung cancer and now new onset stroke and possible septic emboli.  Poor prognosis.  Palliative care input appreciated    DVT prophylaxis: SCDs Code Status: full Family Communication: Updated Patient's Niece over phone on 9/12 Disposition Plan: SNF once medically stable   Status is: Inpatient  Remains inpatient  appropriate because:Ongoing diagnostic testing needed not appropriate for outpatient  work up, IV treatments appropriate due to intensity of illness or inability to take PO, and Inpatient level of care appropriate due to severity of illness  Dispo: The patient is from: Home              Anticipated d/c is to: SNF              Patient currently is not medically stable to d/c.   Difficult to place patient No     I/O last 3 completed shifts: In: 2145.1 [P.O.:458; I.V.:1378.3; Other:10; IV Piggyback:298.8] Out: 1005 [Urine:670; Drains:335] No intake/output data recorded.     Consultants:  IR, General surgery, oncology Cardiology, palliative care, neurology  Procedures: IR guided cholecystostomy  Antimicrobials: Rocephin. Flagyl started on 9/12  Subjective: Right arm weakness continues.  Feeling very weak and frustrated being here  Objective: Vitals:   12/21/20 1506 12/21/20 2024 12/22/20 0406 12/22/20 0832  BP: 114/73 105/74 124/89 (!) 135/92  Pulse: 77 76 (!) 104 92  Resp: 18 20 18 18   Temp: 98 F (36.7 C) 98.5 F (36.9 C) 98.5 F (36.9 C) 98 F (36.7 C)  TempSrc: Oral Oral Oral   SpO2: 92% 94% 93% 94%  Weight:      Height:        Intake/Output Summary (Last 24 hours) at 12/22/2020 1411 Last data filed at 12/22/2020 0611 Gross per 24 hour  Intake 763.28 ml  Output 350 ml  Net 413.28 ml   Filed Weights   12/17/20 1203 12/18/20 2130  Weight: 61.7 kg 62.2 kg    Examination:  General exam: Appears calm and comfortable  Respiratory system: Clear to auscultation. Respiratory effort normal. Cardiovascular system: S1 & S2 heard, RRR. No JVD, murmurs, rubs, gallops or clicks. No pedal edema. Gastrointestinal system: Abdomen is nondistended, soft and nontender. No organomegaly or masses felt. Normal bowel sounds heard.  Cholecystostomy tube draining Central nervous system: Alert and oriented. No focal neurological deficits. Extremities: Right arm weakness. Skin: No  rashes, lesions or ulcers.  Port on left upper chest with no signs/symptoms of infection Psychiatry: Judgement and insight appear normal. Mood & affect appropriate.     Data Reviewed: I have personally reviewed following labs and imaging studies  CBC: Recent Labs  Lab 12/17/20 1205 12/17/20 2000 12/18/20 0857 12/19/20 0457 12/20/20 0047 12/20/20 0500 12/21/20 0434 12/22/20 0513  WBC 0.2*   < > 0.4* 0.6*  --  1.7* 5.3 9.7  NEUTROABS 0.0*  --  0.1* 0.3*  --  0.7* 3.5  --   HGB 10.1*   < > 9.2* 8.9*  --  8.9* 8.8* 9.1*  HCT 27.0*   < > 25.6* 23.8*  --  24.0* 24.9* 25.0*  MCV 95.4   < > 94.1 94.4  --  94.5 97.6 96.2  PLT 7*   < > 16* 9* 28* 42* 41* 51*   < > = values in this interval not displayed.   Basic Metabolic Panel: Recent Labs  Lab 12/18/20 0500 12/19/20 0457 12/20/20 0500 12/21/20 0434 12/22/20 0513  NA 135 138 137 141 141  K 3.0* 3.3* 3.4* 3.6 3.5  CL 101 110 107 111 110  CO2 21* 22 22 22 23   GLUCOSE 166* 118* 112* 99 115*  BUN 48* 43* 36* 30* 21  CREATININE 1.46* 0.94 0.72 0.57* 0.59*  CALCIUM 7.4* 7.0* 7.6* 7.8* 7.9*  MG  --  1.6* 2.1 2.0  --   PHOS  --   --   --  1.6*  --    GFR: Estimated Creatinine Clearance: 76.7 mL/min (A) (by C-G formula based on SCr of 0.59 mg/dL (L)). Liver Function Tests: Recent Labs  Lab 12/17/20 1205  AST 51*  ALT 32  ALKPHOS 50  BILITOT 1.8*  PROT 6.3*  ALBUMIN 3.1*   No results for input(s): LIPASE, AMYLASE in the last 168 hours. No results for input(s): AMMONIA in the last 168 hours. Coagulation Profile: Recent Labs  Lab 12/17/20 1205 12/18/20 0500  INR 1.5* 1.6*   Cardiac Enzymes: No results for input(s): CKTOTAL, CKMB, CKMBINDEX, TROPONINI in the last 168 hours. BNP (last 3 results) No results for input(s): PROBNP in the last 8760 hours. HbA1C: Recent Labs    12/21/20 0434  HGBA1C 5.4   CBG: Recent Labs  Lab 12/18/20 2134 12/21/20 0809 12/21/20 1227  GLUCAP 73 118* 125*   Lipid  Profile: Recent Labs    12/21/20 0434  LDLDIRECT 37.8   Thyroid Function Tests: No results for input(s): TSH, T4TOTAL, FREET4, T3FREE, THYROIDAB in the last 72 hours. Anemia Panel: No results for input(s): VITAMINB12, FOLATE, FERRITIN, TIBC, IRON, RETICCTPCT in the last 72 hours. Sepsis Labs: Recent Labs  Lab 12/17/20 1205 12/17/20 1532 12/17/20 1926 12/18/20 0500 12/19/20 0953  PROCALCITON  --   --   --  24.39  --   LATICACIDVEN 4.0* 5.4* 6.1*  --  1.9    Recent Results (from the past 240 hour(s))  Blood Culture (routine x 2)     Status: Abnormal   Collection Time: 12/17/20 12:05 PM   Specimen: BLOOD  Result Value Ref Range Status   Specimen Description   Final    BLOOD LEFT CHEST PORT Performed at [facility], [address], [place], [place] [address]    Special Requests   Final    BOTTLES DRAWN AEROBIC AND ANAEROBIC Blood Culture adequate volume Performed at [facility], [address] [place], [place] [address]    Culture  Setup Time   Final    GRAM NEGATIVE RODS IN BOTH AEROBIC AND ANAEROBIC BOTTLES CRITICAL VALUE NOTED.  VALUE IS CONSISTENT WITH PREVIOUSLY REPORTED AND CALLED VALUE. Performed at [facility], [address] [place], [place] [address]    Culture (A)  Final    KLEBSIELLA PNEUMONIAE SUSCEPTIBILITIES PERFORMED ON PREVIOUS CULTURE WITHIN THE LAST 5 DAYS. Performed at [facility], [place] [address], [place], [place] [address]    Report Status 12/20/2020 FINAL  Final  Blood Culture (routine x 2)     Status: Abnormal   Collection Time: 12/17/20 12:05 PM   Specimen: BLOOD  Result Value Ref Range Status   Specimen Description   Final    BLOOD LEFT CHEST Performed at [facility], [address] [place], [place] [address]    Special Requests   Final    BOTTLES DRAWN AEROBIC AND ANAEROBIC Blood Culture adequate volume Performed at [facility], [address] [place], [place] [address]     Culture  Setup Time   Final    GRAM NEGATIVE RODS IN BOTH AEROBIC AND ANAEROBIC BOTTLES CRITICAL RESULT CALLED TO, READ BACK BY AND VERIFIED WITH: [place] 1062 12/18/20 HNM Performed at [facility], [place] [address] [place],  [address]    Culture KLEBSIELLA PNEUMONIAE (A)  Final   Report Status 12/20/2020 FINAL  Final   Organism ID, Bacteria KLEBSIELLA PNEUMONIAE  Final      Susceptibility   Klebsiella pneumoniae -  MIC*    AMPICILLIN >=32 RESISTANT Resistant     CEFAZOLIN <=4 SENSITIVE Sensitive     CEFEPIME <=0.12 SENSITIVE Sensitive     CEFTAZIDIME <=1 SENSITIVE Sensitive     CEFTRIAXONE <=0.25 SENSITIVE Sensitive     CIPROFLOXACIN <=0.25 SENSITIVE Sensitive     GENTAMICIN <=1 SENSITIVE Sensitive     IMIPENEM <=0.25 SENSITIVE Sensitive     TRIMETH/SULFA <=20 SENSITIVE Sensitive     AMPICILLIN/SULBACTAM 4 SENSITIVE Sensitive     PIP/TAZO <=4 SENSITIVE Sensitive     * KLEBSIELLA PNEUMONIAE  Blood Culture ID Panel (Reflexed)     Status: Abnormal   Collection Time: 12/17/20 12:05 PM  Result Value Ref Range Status   Enterococcus faecalis NOT DETECTED NOT DETECTED Final   Enterococcus Faecium NOT DETECTED NOT DETECTED Final   Listeria monocytogenes NOT DETECTED NOT DETECTED Final   Staphylococcus species NOT DETECTED NOT DETECTED Final   Staphylococcus aureus (BCID) NOT DETECTED NOT DETECTED Final   Staphylococcus epidermidis NOT DETECTED NOT DETECTED Final   Staphylococcus lugdunensis NOT DETECTED NOT DETECTED Final   Streptococcus species NOT DETECTED NOT DETECTED Final   Streptococcus agalactiae NOT DETECTED NOT DETECTED Final   Streptococcus pneumoniae NOT DETECTED NOT DETECTED Final   Streptococcus pyogenes NOT DETECTED NOT DETECTED Final   A.calcoaceticus-baumannii NOT DETECTED NOT DETECTED Final   Bacteroides fragilis NOT DETECTED NOT DETECTED Final   Enterobacterales DETECTED (A) NOT DETECTED Final    Comment: Enterobacterales represent a large order  of gram negative bacteria, not a single organism. CRITICAL RESULT CALLED TO, READ BACK BY AND VERIFIED WITH: [place] RN [phone] 12/18/20 HNM    Enterobacter cloacae complex NOT DETECTED NOT DETECTED Final   Escherichia coli NOT DETECTED NOT DETECTED Final   Klebsiella aerogenes NOT DETECTED NOT DETECTED Final   Klebsiella oxytoca NOT DETECTED NOT DETECTED Final   Klebsiella pneumoniae DETECTED (A) NOT DETECTED Final    Comment: CRITICAL RESULT CALLED TO, READ BACK BY AND VERIFIED WITH: [place] RN [phone] 12/18/20 HNM    Proteus species NOT DETECTED NOT DETECTED Final   Salmonella species NOT DETECTED NOT DETECTED Final   Serratia marcescens NOT DETECTED NOT DETECTED Final   Haemophilus influenzae NOT DETECTED NOT DETECTED Final   Neisseria meningitidis NOT DETECTED NOT DETECTED Final   Pseudomonas aeruginosa NOT DETECTED NOT DETECTED Final   Stenotrophomonas maltophilia NOT DETECTED NOT DETECTED Final   Candida albicans NOT DETECTED NOT DETECTED Final   Candida auris NOT DETECTED NOT DETECTED Final   Candida glabrata NOT DETECTED NOT DETECTED Final   Candida krusei NOT DETECTED NOT DETECTED Final   Candida parapsilosis NOT DETECTED NOT DETECTED Final   Candida tropicalis NOT DETECTED NOT DETECTED Final   Cryptococcus neoformans/gattii NOT DETECTED NOT DETECTED Final   CTX-M ESBL NOT DETECTED NOT DETECTED Final   Carbapenem resistance IMP NOT DETECTED NOT DETECTED Final   Carbapenem resistance KPC NOT DETECTED NOT DETECTED Final   Carbapenem resistance NDM NOT DETECTED NOT DETECTED Final   Carbapenem resist OXA 48 LIKE NOT DETECTED NOT DETECTED Final   Carbapenem resistance VIM NOT DETECTED NOT DETECTED Final    Comment: Performed at [facility], [address] [place], [place] [address]  Resp Panel by RT-PCR (Flu A&B, Covid) Nasopharyngeal Swab     Status: None   Collection Time: 12/17/20 12:37 PM   Specimen: Nasopharyngeal Swab; Nasopharyngeal(NP) swabs in vial  transport medium  Result Value Ref Range Status   SARS Coronavirus 2 by RT PCR NEGATIVE  NEGATIVE Final    Comment: (NOTE) SARS-CoV-2 target nucleic acids are NOT DETECTED.  The SARS-CoV-2 RNA is generally detectable in upper respiratory specimens during the acute phase of infection. The lowest concentration of SARS-CoV-2 viral copies this assay can detect is 138 copies/mL. A negative result does not preclude SARS-Cov-2 infection and should not be used as the sole basis for treatment or other patient management decisions. A negative result may occur with  improper specimen collection/handling, submission of specimen other than nasopharyngeal swab, presence of viral mutation(s) within the areas targeted by this assay, and inadequate number of viral copies(<138 copies/mL). A negative result must be combined with clinical observations, patient history, and epidemiological information. The expected result is Negative.  Fact Sheet for Patients:  EntrepreneurPulse.com.au  Fact Sheet for Healthcare Providers:  IncredibleEmployment.be  This test is no t yet approved or cleared by the [place] FDA and  has been authorized for detection and/or diagnosis of SARS-CoV-2 by FDA under an Emergency Use Authorization (EUA). This EUA will remain  in effect (meaning this test can be used) for the duration of the COVID-19 declaration under Section 564(b)(1) of the Act, 21 U.S.C.section 360bbb-3(b)(1), unless the authorization is terminated  or revoked sooner.       Influenza A by PCR NEGATIVE NEGATIVE Final   Influenza B by PCR NEGATIVE NEGATIVE Final    Comment: (NOTE) The Xpert Xpress SARS-CoV-2/FLU/RSV plus assay is intended as an aid in the diagnosis of influenza from Nasopharyngeal swab specimens and should not be used as a sole basis for treatment. Nasal washings and aspirates are unacceptable for Xpert Xpress SARS-CoV-2/FLU/RSV testing.  Fact  Sheet for Patients: EntrepreneurPulse.com.au  Fact Sheet for Healthcare Providers: IncredibleEmployment.be  This test is not yet approved or cleared by the [place] FDA and has been authorized for detection and/or diagnosis of SARS-CoV-2 by FDA under an Emergency Use Authorization (EUA). This EUA will remain in effect (meaning this test can be used) for the duration of the COVID-19 declaration under Section 564(b)(1) of the Act, 21 U.S.C. section 360bbb-3(b)(1), unless the authorization is terminated or revoked.  Performed at [facility], [address], [place], [place] [address]   Urine Culture     Status: None   Collection Time: 12/17/20  3:32 PM   Specimen: In/Out Cath Urine  Result Value Ref Range Status   Specimen Description   Final    IN/OUT CATH URINE Performed at [facility], [address], [place], [place] [address]    Special Requests   Final    NONE Performed at [facility], [address], [place], [place] [address]    Culture   Final    NO GROWTH Performed at [facility], [place] [address] [place], [place] [address]    Report Status 12/19/2020 FINAL  Final  C Difficile Quick Screen w PCR reflex     Status: None   Collection Time: 12/17/20  3:32 PM   Specimen: STOOL  Result Value Ref Range Status   C Diff antigen NEGATIVE NEGATIVE Final   C Diff toxin NEGATIVE NEGATIVE Final   C Diff interpretation No C. difficile detected.  Final    Comment: Performed at [facility], [address] [place], [place] [address]  MRSA Next Gen by PCR, Nasal     Status: Abnormal   Collection Time: 12/18/20  9:55 PM   Specimen: Nasal Mucosa; Nasal Swab  Result Value Ref Range Status   MRSA by  PCR Next Gen DETECTED (A) NOT DETECTED Final    Comment: RESULT CALLED TO, READ BACK BY AND VERIFIED WITH: BETH BUONO @ 2327 12/18/20 LFD (NOTE) The GeneXpert MRSA Assay (FDA approved for NASAL  specimens only), is one component of a comprehensive MRSA colonization surveillance program. It is not intended to diagnose MRSA infection nor to guide or monitor treatment for MRSA infections. Test performance is not FDA approved in patients less than 39 years old. Performed at [facility], [address] [place], [place] [address]          Radiology Studies: CT ANGIO HEAD NECK W WO CM  Result Date: 12/21/2020 CLINICAL DATA:  Neuro deficit, acute, stroke suspected. Neutropenic sepsis. EXAM: CT ANGIOGRAPHY HEAD AND NECK TECHNIQUE: Multidetector CT imaging of the head and neck was performed using the standard protocol during bolus administration of intravenous contrast. Multiplanar CT image reconstructions and MIPs were obtained to evaluate the vascular anatomy. Carotid stenosis measurements (when applicable) are obtained utilizing NASCET criteria, using the distal internal carotid diameter as the denominator. CONTRAST:  32mL OMNIPAQUE IOHEXOL 350 MG/ML SOLN COMPARISON:  MRI of the brain December 20, 2020. FINDINGS: CT HEAD FINDINGS Brain: Confluent hypodensity of the white matter of the cerebral hemispheres, nonspecific, most likely related to chronic small vessel ischemia. Foci of acute infarct are more conspicuous on recent MRI of the brain. No new large territorial infarct. No hemorrhage, hydrocephalus, mass or midline shift. Vascular: No hyperdense vessel or unexpected calcification. Skull: Normal. Negative for fracture or focal lesion. Sinuses: Increased thickness of the walls of the bilateral maxillary sinuses with bubbly secretion on the left. Findings are suggestive of chronic sinusitis. Right mastoid effusion. Orbits: No acute finding. Review of the MIP images confirms the above findings CTA NECK FINDINGS Aortic arch: Standard branching. Imaged portion shows no evidence of aneurysm or dissection. Mild atherosclerotic changes of the aortic arch without significant stenosis of  the major arch vessel origins. Right carotid system: Calcified atherosclerotic disease of the right carotid bifurcation without hemodynamically significant stenosis. Left carotid system: Atherosclerotic disease of the left carotid bifurcation with mixed density plaques without hemodynamically significant stenosis. Vertebral arteries: Calcified atherosclerotic plaque at the origin of the right vertebral artery without hemodynamically significant stenosis. Mild atherosclerotic changes at the origin of the left vertebral artery without hemodynamically significant stenosis. Cervical vertebral arteries otherwise have normal caliber Skeleton: Degenerative changes of the cervical spine with fusion of the facet joints at C2-3 on the left. No acute findings. Other neck: Negative. Upper chest: Left-sided pleural effusion and atelectasis. Mediastinal lymphadenopathy. Review of the MIP images confirms the above findings CTA HEAD FINDINGS Anterior circulation: Calcified atherosclerotic plaques in the bilateral carotid siphons. No significant stenosis, proximal occlusion, aneurysm, or vascular malformation. Posterior circulation: No significant stenosis, proximal occlusion, aneurysm, or vascular malformation. Hypoplastic/aplastic right P1/PCA segment with right fetal PCA. Venous sinuses: As permitted by contrast timing, patent. Anatomic variants: Right fetal PCA. Review of the MIP images confirms the above findings IMPRESSION: 1. Advanced chronic microvascular ischemic changes of the white matter. 2. Atherosclerotic disease of the bilateral carotid bifurcation without hemodynamically significant stenosis. 3. Mild atherosclerotic changes at the origin of the bilateral vertebral arteries without hemodynamically significant stenosis. 4. Mild intracranial atherosclerotic disease without hemodynamically significant stenosis. Electronically Signed   By: Pedro Earls M.D.   On: 12/21/2020 15:32   ECHOCARDIOGRAM  COMPLETE  Result Date: 12/21/2020    ECHOCARDIOGRAM REPORT   Patient Name:   THARUN CAPPELLA Adamski Date  of Exam: 12/21/2020 Medical Rec #:  [id number]    Height:       72.0 in Accession #:    [id number]   Weight:       137.1 lb Date of Birth:  [birth year]     BSA:          1.815 m Patient Age:    25 years     BP:           114/80 mmHg Patient Gender: M            HR:           89 bpm. Exam Location:  [facility] Procedure: 2D Echo, Color Doppler and Cardiac Doppler Indications:     Endocarditis I38  History:         Patient has prior history of Echocardiogram examinations, most                  recent 08/19/2019. Arrythmias:Atrial Fibrillation; Risk                  Factors:Hypertension. Substance abuse.  Sonographer:     Sherrie Sport Referring Phys:  [place] Diagnosing Phys: Serafina Royals MD IMPRESSIONS  1. Left ventricular ejection fraction, by estimation, is 60 to 65%. The left ventricle has normal function. The left ventricle has no regional wall motion abnormalities. Left ventricular diastolic parameters were normal.  2. Right ventricular systolic function is normal. The right ventricular size is normal.  3. The mitral valve is normal in structure. Trivial mitral valve regurgitation.  4. The aortic valve is normal in structure. Aortic valve regurgitation is not visualized. Mild aortic valve sclerosis is present, with no evidence of aortic valve stenosis. Conclusion(s)/Recommendation(s): No evidence of valve vegetations. FINDINGS  Left Ventricle: Left ventricular ejection fraction, by estimation, is 60 to 65%. The left ventricle has normal function. The left ventricle has no regional wall motion abnormalities. The left ventricular internal cavity size was small. There is no left ventricular hypertrophy. Left ventricular diastolic parameters were normal. Right Ventricle: The right ventricular size is normal. No increase in right ventricular wall thickness. Right ventricular systolic function is normal. Left Atrium: Left  atrial size was normal in size. Right Atrium: Right atrial size was normal in size. Pericardium: There is no evidence of pericardial effusion. Mitral Valve: The mitral valve is normal in structure. Trivial mitral valve regurgitation. Tricuspid Valve: The tricuspid valve is normal in structure. Tricuspid valve regurgitation is trivial. Aortic Valve: The aortic valve is normal in structure. Aortic valve regurgitation is not visualized. Mild aortic valve sclerosis is present, with no evidence of aortic valve stenosis. Aortic valve mean gradient measures 3.0 mmHg. Aortic valve peak gradient measures 5.9 mmHg. Aortic valve area, by VTI measures 2.30 cm. Pulmonic Valve: The pulmonic valve was normal in structure. Pulmonic valve regurgitation is not visualized. Aorta: The aortic root and ascending aorta are structurally normal, with no evidence of dilitation. IAS/Shunts: No atrial level shunt detected by color flow Doppler.  LEFT VENTRICLE PLAX 2D LVIDd:         3.87 cm LVIDs:         2.30 cm LV PW:         1.00 cm LV IVS:        1.18 cm LVOT diam:     2.10 cm LV SV:         44 LV SV Index:   24 LVOT Area:     3.46 cm  RIGHT VENTRICLE RV Basal diam:  3.70 cm RV S prime:     12.40 cm/s TAPSE (M-mode): 3.7 cm LEFT ATRIUM           Index       RIGHT ATRIUM           Index LA diam:      3.50 cm 1.93 cm/m  RA Area:     33.70 cm LA Vol (A2C): 57.4 ml 31.63 ml/m RA Volume:   128.00 ml 70.53 ml/m LA Vol (A4C): 72.1 ml 39.73 ml/m  AORTIC VALVE                   PULMONIC VALVE AV Area (Vmax):    2.34 cm    PV Vmax:        0.60 m/s AV Area (Vmean):   2.32 cm    PV Peak grad:   1.4 mmHg AV Area (VTI):     2.30 cm    RVOT Peak grad: 2 mmHg AV Vmax:           121.00 cm/s AV Vmean:          86.000 cm/s AV VTI:            0.193 m AV Peak Grad:      5.9 mmHg AV Mean Grad:      3.0 mmHg LVOT Vmax:         81.60 cm/s LVOT Vmean:        57.600 cm/s LVOT VTI:          0.128 m LVOT/AV VTI ratio: 0.66  AORTA Ao Root diam: 3.50 cm  MITRAL VALVE               TRICUSPID VALVE MV Area (PHT): 4.52 cm    TR Peak grad:   24.4 mmHg MV Decel Time: 168 msec    TR Vmax:        247.00 cm/s MV E velocity: 88.30 cm/s                            SHUNTS                            Systemic VTI:  0.13 m                            Systemic Diam: 2.10 cm Serafina Royals MD Electronically signed by Serafina Royals MD Signature Date/Time: 12/21/2020/5:20:09 PM    Final         Scheduled Meds:  (feeding supplement) PROSource Plus  30 mL Oral BID BM   sodium chloride   Intravenous Once   sodium chloride   Intravenous Once   aspirin  81 mg Oral Daily   chlorhexidine  10 mL Mouth/Throat BID   Chlorhexidine Gluconate Cloth  6 each Topical Daily   diltiazem  240 mg Oral Daily   dronabinol  2.5 mg Oral BID AC   feeding supplement  1 Container Oral TID BM   feeding supplement  237 mL Oral TID BM   folic acid  1 mg Oral Daily   multivitamin with minerals  1 tablet Oral Daily   mupirocin ointment  1 application Nasal BID   sodium chloride flush  10-40 mL Intracatheter Q12H   sodium chloride flush  5 mL Intracatheter Q8H   thiamine  100 mg Oral Daily   Continuous Infusions:  sodium chloride Stopped (12/20/20 1551)   sodium chloride 10 mL/hr (12/20/20 1220)   0.9 % NaCl with KCl 20 mEq / L 50 mL/hr at 12/21/20 1835   cefTRIAXone (ROCEPHIN)  IV 2 g (12/22/20 0542)   metronidazole 500 mg (12/22/20 1003)     LOS: 5 days    Time spent: 66 minutes    Lelon Ikard Manuella Ghazi, MD [facility]   To contact the attending provider between 7A-7P or the covering provider during after hours 7P-7A, please log into the web site [email or web address] and access using universal [place] password for that web site. If you do not have the password, please call the hospital operator.  12/22/2020, 2:11 PM

## 2020-12-22 NOTE — Progress Notes (Addendum)
Hematology/Oncology Progress Note [facility] [phone] [phone]  Patient Care Team: Earlie Server, MD as PCP - General (Oncology) Telford Nab, RN as Registered Nurse Erby Pian, MD as Referring Physician (Specialist) Teodoro Spray, MD as Consulting Physician (Cardiology) Anthonette Legato, MD as Consulting Physician (Nephrology) Vladimir Crofts, MD as Consulting Physician (Neurology) Meade Maw, MD as Consulting Physician (Neurosurgery)   Name of the patient: Scott Jordan  [id number]  Jun 10, 1950  Date of visit: 12/22/20   INTERVAL HISTORY-  S/p cholecystostomy New onset of right upper extremity weakness 12/20/2020 MRI brain w wo contrast - multiple infarcts.  12/21/2020 CT angiogram head and neck showed advanced chronic microvascular ischemic changes of the white matter.  Atherosclerosis disease of the bilateral carotid bifurcation, bilateral vertebral arteries without hemodynamically significant stenosis.  Mild intracranial disease without stenosis. 12/21/2020 echocardiogram showed LVEF 60-65%. Patient reports feeling tired and fatigued.  No Known Allergies  Patient Active Problem List   Diagnosis Date Noted   Cerebrovascular accident (CVA) due to embolism of precerebral artery ([place])    Palliative care encounter    Cholelithiasis with acute cholecystitis 12/20/2020   Cholecystitis    Septicemia due to Klebsiella pneumoniae ([place]) 12/18/2020   Neutropenic sepsis ([place]) 12/17/2020   Head trauma 06/24/2020   Ulnar neuropathy of left upper extremity 05/13/2020   Cervical radiculopathy 05/13/2020   B12 deficiency 04/06/2020   Weight loss 03/23/2020   Neuropathy of left upper extremity 03/22/2020   Hepatitis B infection without delta agent without hepatic coma 12/30/2019   Chronic hepatitis C without hepatic coma ([place]) 12/30/2019   Chronic anticoagulation 12/23/2019   Elevated LFTs 12/23/2019   Hypokalemia 12/08/2019    Acute respiratory failure with hypoxia ([place]) 11/23/2019   Heroin overdose, accidental or unintentional, initial encounter ([place]) 11/23/2019   Aspiration pneumonia ([place]) 11/23/2019   Pancytopenia ([place]) 11/23/2019   Atypical atrial flutter ([place]) 11/23/2019   Hypomagnesemia 10/21/2019   Antineoplastic chemotherapy induced anemia 10/13/2019   Dehydration 10/13/2019   Thrombocytopenia ([place]) 10/13/2019   Chemotherapy induced neutropenia ([place]) 10/10/2019   Anemia 09/11/2019   Cancer-related pain 09/05/2019   Encounter for antineoplastic chemotherapy 09/02/2019   Encounter for antineoplastic immunotherapy 09/02/2019   Bone metastasis ([place]) 09/02/2019   Goals of care, counseling/discussion 08/26/2019   Small cell lung cancer ([place]) 08/23/2019   Protein-calorie malnutrition, severe 08/19/2019   Hyponatremia    Dyspnea    Pleural effusion    Recurrent pleural effusion on left    Malignant pleural effusion 08/17/2019   Alcohol abuse 07/21/2019   History of substance abuse ([place]) 06/18/2019   Hypertension, essential 06/18/2019   Malnutrition of moderate degree 01/15/2019   Elevated troponin 01/14/2019     Past Medical History:  Diagnosis Date   A-fib ([place]) 01/10/2019   pt st this was dx by Dr. Ubaldo Glassing   Cancer [facility]) 0258   LUNG   Complication of anesthesia    DIFFICULTY WAKING UP AFTER SURGERY- 20 YRS AGO   Hypertension    Lung cancer ([place])    Substance abuse ([place])      Past Surgical History:  Procedure Laterality Date   ARM DEBRIDEMENT Left    INCISION AND DEBRIDEMENT LOWER ARM -20 YRS AGO   BACK SURGERY     IR PERC CHOLECYSTOSTOMY  12/20/2020   PORTACATH PLACEMENT Left 08/29/2019   Procedure: INSERTION PORT-A-CATH;  Surgeon: Nestor Lewandowsky, MD;  Location: [facility] ORS;  Service: General;  Laterality: Left;    Social History  Socioeconomic History   Marital status: Single    Spouse name: Not on file   Number of children: Not on file   Years of education: Not on file   Highest  education level: Not on file  Occupational History   Not on file  Tobacco Use   Smoking status: Former    Packs/day: 0.50    Types: Cigarettes   Smokeless tobacco: Never   Tobacco comments:    not smoked since 6 weeks ago  Vaping Use   Vaping Use: Never used  Substance and Sexual Activity   Alcohol use: Yes    Comment: 39 OZ IN WEEK   Drug use: Not Currently    Types: Heroin    Comment: heroin WITHIN PAST YEAR   Sexual activity: Not on file  Other Topics Concern   Not on file  Social History Narrative   Not on file   Social Determinants of Health   Financial Resource Strain: Not on file  Food Insecurity: Not on file  Transportation Needs: Not on file  Physical Activity: Not on file  Stress: Not on file  Social Connections: Not on file  Intimate Partner Violence: Not on file     History reviewed. No pertinent family history.   Current Facility-Administered Medications:    (feeding supplement) PROSource Plus liquid 30 mL, 30 mL, Oral, BID BM, Sharen Hones, MD, 30 mL at 12/21/20 1056   0.9 %  sodium chloride infusion (Manually program via Guardrails IV Fluids), , Intravenous, Once, Sharen Hones, MD   0.9 %  sodium chloride infusion (Manually program via Guardrails IV Fluids), , Intravenous, Once, Sharen Hones, MD   0.9 %  sodium chloride infusion, , Intravenous, PRN, Sharen Hones, MD, Stopped at 12/20/20 1551   0.9 %  sodium chloride infusion, , , Continuous PRN, Criselda Peaches, MD, Last Rate: 10 mL/hr at 12/20/20 1220, 10 mL/hr at 12/20/20 1220   0.9 % NaCl with KCl 20 mEq/ L  infusion, , Intravenous, Continuous, Sharen Hones, MD, Last Rate: 50 mL/hr at 12/21/20 1835, Infusion Verify at 12/21/20 1835   acetaminophen (TYLENOL) tablet 650 mg, 650 mg, Oral, Q6H PRN **OR** acetaminophen (TYLENOL) suppository 650 mg, 650 mg, Rectal, Q6H PRN, Cox, Amy N, DO   cefTRIAXone (ROCEPHIN) 2 g in sodium chloride 0.9 % 100 mL IVPB, 2 g, Intravenous, Q24H, Sharen Hones, MD, Last  Rate: 200 mL/hr at 12/22/20 0542, 2 g at 12/22/20 0542   chlorhexidine (PERIDEX) 0.12 % solution 10 mL, 10 mL, Mouth/Throat, BID, Earlie Server, MD, 10 mL at 12/22/20 1002   Chlorhexidine Gluconate Cloth 2 % PADS 6 each, 6 each, Topical, Daily, Cox, Amy N, DO, 6 each at 12/21/20 1056   diltiazem (CARDIZEM CD) 24 hr capsule 240 mg, 240 mg, Oral, Daily, Cox, Amy N, DO, 240 mg at 12/22/20 1009   diltiazem (CARDIZEM) injection 10 mg, 10 mg, Intravenous, Q2H PRN, Cox, Amy N, DO   dronabinol (MARINOL) capsule 2.5 mg, 2.5 mg, Oral, BID AC, Sharen Hones, MD, 2.5 mg at 12/21/20 1257   feeding supplement (BOOST / RESOURCE BREEZE) liquid 1 Container, 1 Container, Oral, TID BM, Sharen Hones, MD, 1 Container at 12/22/20 0958   feeding supplement (ENSURE ENLIVE / ENSURE PLUS) liquid 237 mL, 237 mL, Oral, TID BM, Cox, Amy N, DO, 237 mL at 12/22/20 0959   fentaNYL (SUBLIMAZE) injection, , , PRN, Criselda Peaches, MD, 50 mcg at 19/37/90 2409   folic acid (FOLVITE) tablet 1 mg, 1 mg,  Oral, Daily, Sharen Hones, MD, 1 mg at 12/22/20 1001   HYDROmorphone (DILAUDID) injection, , , PRN, Criselda Peaches, MD, 0.5 mg at 12/20/20 1259   metroNIDAZOLE (FLAGYL) IVPB 500 mg, 500 mg, Intravenous, Q12H, Max Sane, MD, Last Rate: 100 mL/hr at 12/22/20 1003, 500 mg at 12/22/20 1003   midazolam (VERSED) injection, , , PRN, Criselda Peaches, MD, 1 mg at 12/20/20 1250   multivitamin with minerals tablet 1 tablet, 1 tablet, Oral, Daily, Sharen Hones, MD, 1 tablet at 12/22/20 1001   mupirocin ointment (BACTROBAN) 2 % 1 application, 1 application, Nasal, BID, Cox, Amy N, DO, 1 application at 43/15/40 1005   ondansetron (ZOFRAN) tablet 4 mg, 4 mg, Oral, Q6H PRN **OR** ondansetron (ZOFRAN) injection 4 mg, 4 mg, Intravenous, Q6H PRN, Cox, Amy N, DO   oxyCODONE-acetaminophen (PERCOCET/ROXICET) 5-325 MG per tablet 1 tablet, 1 tablet, Oral, Q6H PRN, Mansy, Jan A, MD, 1 tablet at 12/22/20 0307   sodium chloride flush (NS) 0.9 % injection  10-40 mL, 10-40 mL, Intracatheter, Q12H, Manuella Ghazi, Vipul, MD, 10 mL at 12/22/20 1015   sodium chloride flush (NS) 0.9 % injection 10-40 mL, 10-40 mL, Intracatheter, PRN, Manuella Ghazi, Vipul, MD   sodium chloride flush (NS) 0.9 % injection 5 mL, 5 mL, Intracatheter, Q8H, Criselda Peaches, MD, 5 mL at 12/22/20 0543   thiamine tablet 100 mg, 100 mg, Oral, Daily, Sharen Hones, MD, 100 mg at 12/22/20 1001  Facility-Administered Medications Ordered in Other Encounters:    heparin lock flush 100 unit/mL, 500 Units, Intravenous, Once, Corcoran, Melissa C, MD   sodium chloride flush (NS) 0.9 % injection 10 mL, 10 mL, Intravenous, PRN, Nolon Stalls C, MD, 10 mL at 11/11/19 0812   Physical exam:  Vitals:   12/21/20 1506 12/21/20 2024 12/22/20 0406 12/22/20 0832  BP: 114/73 105/74 124/89 (!) 135/92  Pulse: 77 76 (!) 104 92  Resp: 18 20 18 18   Temp: 98 F (36.7 C) 98.5 F (36.9 C) 98.5 F (36.9 C) 98 F (36.7 C)  TempSrc: Oral Oral Oral   SpO2: 92% 94% 93% 94%  Weight:      Height:       Physical Exam Constitutional:      General: He is not in acute distress.    Appearance: He is not diaphoretic.  HENT:     Head: Normocephalic and atraumatic.     Nose: Nose normal.     Mouth/Throat:     Pharynx: No oropharyngeal exudate.  Eyes:     General: No scleral icterus.    Pupils: Pupils are equal, round, and reactive to light.  Cardiovascular:     Rate and Rhythm: Normal rate and regular rhythm.     Heart sounds: No murmur heard. Pulmonary:     Effort: Pulmonary effort is normal. No respiratory distress.     Breath sounds: No rales.  Chest:     Chest wall: No tenderness.  Abdominal:     General: There is no distension.     Palpations: Abdomen is soft.     Tenderness: There is no abdominal tenderness.     Comments: cholecystostomy tube  Musculoskeletal:        General: Normal range of motion.     Cervical back: Normal range of motion and neck supple.  Skin:    General: Skin is warm and  dry.     Findings: No erythema.  Neurological:     Mental Status: He is alert. Mental status is at  baseline.     Cranial Nerves: No cranial nerve deficit.     Motor: No abnormal muscle tone.     Coordination: Coordination normal.  Psychiatric:        Mood and Affect: Affect normal.       CMP Latest Ref Rng & Units 12/22/2020  Glucose 70 - 99 mg/dL 115(H)  BUN 8 - 23 mg/dL 21  Creatinine 0.61 - 1.24 mg/dL 0.59(L)  Sodium 135 - 145 mmol/L 141  Potassium 3.5 - 5.1 mmol/L 3.5  Chloride 98 - 111 mmol/L 110  CO2 22 - 32 mmol/L 23  Calcium 8.9 - 10.3 mg/dL 7.9(L)  Total Protein 6.5 - 8.1 g/dL -  Total Bilirubin 0.3 - 1.2 mg/dL -  Alkaline Phos 38 - 126 U/L -  AST 15 - 41 U/L -  ALT 0 - 44 U/L -   CBC Latest Ref Rng & Units 12/22/2020  WBC 4.0 - 10.5 K/uL 9.7  Hemoglobin 13.0 - 17.0 g/dL 9.1(L)  Hematocrit 39.0 - 52.0 % 25.0(L)  Platelets 150 - 400 K/uL 51(L)    RADIOGRAPHIC STUDIES: I have personally reviewed the radiological images as listed and agreed with the findings in the report. CT ABDOMEN PELVIS WO CONTRAST  Result Date: 12/17/2020 CLINICAL DATA:  Nausea and vomiting EXAM: CT ABDOMEN AND PELVIS WITHOUT CONTRAST TECHNIQUE: Multidetector CT imaging of the abdomen and pelvis was performed following the standard protocol without IV contrast. COMPARISON:  CT 06/23/2020, PET CT 11/19/2020 FINDINGS: Lower chest: Lung bases demonstrate emphysema. Subpleural bandlike airspace disease at the left lower lobe redemonstrated. Incompletely visualized subpleural peripheral left upper lobe nodule measuring 11 mm, series 5, image 1. Cardiomegaly with trace pericardial effusion. Coronary vascular calcification. Hepatobiliary: Extensive motion degradation. No focal hepatic abnormality. Gallstones. Gallbladder slightly distended. Possible wall thickening or pericholecystic fluid though limited by motion Pancreas: Unremarkable. No pancreatic ductal dilatation or surrounding inflammatory changes.  Spleen: Normal in size without focal abnormality. Adrenals/Urinary Tract: Thickened appearance of adrenal glands. No hydronephrosis. The bladder is unremarkable Stomach/Bowel: Stomach nonenlarged. No dilated small bowel. Negative appendix. No acute bowel wall thickening Vascular/Lymphatic: Advanced aortic atherosclerosis. No aneurysm. Slightly increased multiple small retroperitoneal lymph nodes. Reproductive: Prostate is unremarkable. Other: Negative for free air or free fluid Musculoskeletal: No acute osseous abnormality. IMPRESSION: 1. Degraded by motion. 2. Gallbladder appears slightly distended and there are gallstones. Possible gallbladder wall thickening or pericholecystic fluid though limited by motion, consider correlation with ultrasound 3. Cardiomegaly with trace pericardial effusion. 4. Similar subpleural bandlike density at left lower lobe. Incompletely visualized peripheral left upper lobe lung nodule characterized on recent PET CT Electronically Signed   By: Donavan Foil M.D.   On: 12/17/2020 16:50   CT ANGIO HEAD NECK W WO CM  Result Date: 12/21/2020 CLINICAL DATA:  Neuro deficit, acute, stroke suspected. Neutropenic sepsis. EXAM: CT ANGIOGRAPHY HEAD AND NECK TECHNIQUE: Multidetector CT imaging of the head and neck was performed using the standard protocol during bolus administration of intravenous contrast. Multiplanar CT image reconstructions and MIPs were obtained to evaluate the vascular anatomy. Carotid stenosis measurements (when applicable) are obtained utilizing NASCET criteria, using the distal internal carotid diameter as the denominator. CONTRAST:  86mL OMNIPAQUE IOHEXOL 350 MG/ML SOLN COMPARISON:  MRI of the brain December 20, 2020. FINDINGS: CT HEAD FINDINGS Brain: Confluent hypodensity of the white matter of the cerebral hemispheres, nonspecific, most likely related to chronic small vessel ischemia. Foci of acute infarct are more conspicuous on recent MRI of the brain. No new  large territorial infarct. No hemorrhage, hydrocephalus, mass or midline shift. Vascular: No hyperdense vessel or unexpected calcification. Skull: Normal. Negative for fracture or focal lesion. Sinuses: Increased thickness of the walls of the bilateral maxillary sinuses with bubbly secretion on the left. Findings are suggestive of chronic sinusitis. Right mastoid effusion. Orbits: No acute finding. Review of the MIP images confirms the above findings CTA NECK FINDINGS Aortic arch: Standard branching. Imaged portion shows no evidence of aneurysm or dissection. Mild atherosclerotic changes of the aortic arch without significant stenosis of the major arch vessel origins. Right carotid system: Calcified atherosclerotic disease of the right carotid bifurcation without hemodynamically significant stenosis. Left carotid system: Atherosclerotic disease of the left carotid bifurcation with mixed density plaques without hemodynamically significant stenosis. Vertebral arteries: Calcified atherosclerotic plaque at the origin of the right vertebral artery without hemodynamically significant stenosis. Mild atherosclerotic changes at the origin of the left vertebral artery without hemodynamically significant stenosis. Cervical vertebral arteries otherwise have normal caliber Skeleton: Degenerative changes of the cervical spine with fusion of the facet joints at C2-3 on the left. No acute findings. Other neck: Negative. Upper chest: Left-sided pleural effusion and atelectasis. Mediastinal lymphadenopathy. Review of the MIP images confirms the above findings CTA HEAD FINDINGS Anterior circulation: Calcified atherosclerotic plaques in the bilateral carotid siphons. No significant stenosis, proximal occlusion, aneurysm, or vascular malformation. Posterior circulation: No significant stenosis, proximal occlusion, aneurysm, or vascular malformation. Hypoplastic/aplastic right P1/PCA segment with right fetal PCA. Venous sinuses: As  permitted by contrast timing, patent. Anatomic variants: Right fetal PCA. Review of the MIP images confirms the above findings IMPRESSION: 1. Advanced chronic microvascular ischemic changes of the white matter. 2. Atherosclerotic disease of the bilateral carotid bifurcation without hemodynamically significant stenosis. 3. Mild atherosclerotic changes at the origin of the bilateral vertebral arteries without hemodynamically significant stenosis. 4. Mild intracranial atherosclerotic disease without hemodynamically significant stenosis. Electronically Signed   By: Pedro Earls M.D.   On: 12/21/2020 15:32   CT HEAD WO CONTRAST (5MM)  Result Date: 12/17/2020 CLINICAL DATA:  Nausea vomiting EXAM: CT HEAD WITHOUT CONTRAST TECHNIQUE: Contiguous axial images were obtained from the base of the skull through the vertex without intravenous contrast. COMPARISON:  CT brain 06/24/2020, MRI 03/10/2020, head CT 11/22/2019 FINDINGS: Brain: No acute territorial infarction, hemorrhage or intracranial mass. Extensive white matter disease, progressed from more remote exams from 2021. Stable ventricle size. Chronic lacunar infarcts within the left white matter. Vascular: No hyperdense vessel.  Carotid vascular calcification Skull: Normal. Negative for fracture or focal lesion. Sinuses/Orbits: Mucosal thickening the sinuses. Chronic appearing nasal deformity Other: None IMPRESSION: 1. No definite CT evidence for acute intracranial abnormality. 2. Atrophy. Extensive white matter disease, grossly stable as compared with recent head CT from March, appears progressive when compared to exams from 2021 and prior Electronically Signed   By: Donavan Foil M.D.   On: 12/17/2020 16:39   MR BRAIN W WO CONTRAST  Addendum Date: 12/21/2020   ADDENDUM REPORT: 12/21/2020 14:49 ADDENDUM: Patient returns for repeat postcontrast imaging. There is no abnormal enhancement. No evidence of intracranial metastatic disease. Electronically  Signed   By: Macy Mis M.D.   On: 12/21/2020 14:49   Result Date: 12/21/2020 CLINICAL DATA:  Neuro deficit, acute, stroke suspected Small cell lung cancer, monitor EXAM: MRI HEAD WITHOUT AND WITH CONTRAST TECHNIQUE: Multiplanar, multiecho pulse sequences of the brain and surrounding structures were obtained without and with intravenous contrast. CONTRAST:  7.63mL GADAVIST GADOBUTROL 1 MMOL/ML IV SOLN COMPARISON:  CT head 12/17/2020. FINDINGS: Mildly motion limited exam.  Within this limitation: Brain: Multiple small areas of restricted diffusion in the bilateral frontal and occipital, and left parietal matter, left perirolandic cortex, left thalamocapsular region, and left cerebellum. Advanced confluent T2 hyperintensity within the white matter, nonspecific but compatible with chronic microvascular ischemic disease and progressed since 2021. Small remote infarct in the left corona radiata. No hydrocephalus, obvious mass lesion, midline shift, acute hemorrhage, or extra-axial fluid collection. Punctate foci of susceptibility artifact in the left cerebellum and left frontal white matter, nonspecific but potentially related to prior microhemorrhage. Contrast was administered; however, there is no evidence of enhancement (for example, the nasal mucosa/turbinates are nonenhancing). This precludes evaluation for enhancing lesions. Vascular: Major arterial flow voids are maintained at the skull base. Skull and upper cervical spine: Normal marrow signal. Sinuses/Orbits: Mild paranasal sinus disease. Other: Moderate right and small left mastoid effusions. IMPRESSION: 1. Multiple small areas of restricted diffusion in the bilateral frontal and occipital and left parietal white matter, left perirolandic cortex, left thalamocapsular region, and left cerebellum, which are concerning for acute infarcts. Given involvement of multiple vascular territories, consider a central embolic etiology. 2. Contrast was administered;  however, there is no evidence of enhancement, unclear why given reported uneventful injection per the technologist. This precludes evaluation for enhancing lesions. Given the patient's known small cell lung cancer diagnosis, recommend repeat postcontrast imaging to exclude metastatic disease. I've asked the MRI technologist to not bill the patient for the repeat post-contrast imaging. 3. Advanced chronic microvascular ischemic disease, progressed since 2021. 4. Moderate right and small left mastoid effusions. Electronically Signed: By: Margaretha Sheffield M.D. On: 12/20/2020 14:29   IR Perc Cholecystostomy  Result Date: 12/20/2020 INDICATION: 70 year old male with small cell lung cancer and chemotherapy induced pancytopenia presenting with neutropenic sepsis. Blood cultures grew out Klebsiella and imaging findings are concerning for acute cholecystitis. He is too sick and frail to be considered an operative candidate. Following transfusion of platelets, his platelets are now at an acceptable level to proceed with percutaneous cholecystostomy tube placement. EXAM: CHOLECYSTOSTOMY MEDICATIONS: In patient currently receiving intravenous antibiotic therapy. No additional antibiotic prophylaxis administered. ANESTHESIA/SEDATION: Moderate (conscious) sedation was employed during this procedure. A total of Versed 1 mg and Fentanyl 50 mcg and 0.5 mg Dilaudid administered intravenously. Moderate Sedation Time: 13 minutes. The patient's level of consciousness and vital signs were monitored continuously by radiology nursing throughout the procedure under my direct supervision. FLUOROSCOPY TIME:  Fluoroscopy Time: 0 minutes 30 seconds (4.3 mGy). COMPLICATIONS: None immediate. PROCEDURE: Informed written consent was obtained from the patient after a thorough discussion of the procedural risks, benefits and alternatives. All questions were addressed. Maximal Sterile Barrier Technique was utilized including caps, mask, sterile  gowns, sterile gloves, sterile drape, hand hygiene and skin antiseptic. A timeout was performed prior to the initiation of the procedure. The right upper quadrant was interrogated with ultrasound. The distended gallbladder with a thickened wall was successfully identified. There is significant excursion of the liver with respiration due to use of abdominal muscles. A suitable skin entry site was selected. Local anesthesia was attained by infiltration with 1% lidocaine. A small dermatotomy was made. Under real-time ultrasound guidance, the movement of the liver was timed with respirations and in a single pass, a 21 gauge Accustick needle was advanced through the liver and into the gallbladder lumen along a short transhepatic course. A 0.018 wire was quickly advanced into the gallbladder lumen and the needle was exchanged for the  transitional dilator. The transitional dilator was advanced into the gallbladder lumen. The wire was removed. Contrast injection was performed under fluoroscopy confirming opacification of the gallbladder lumen. Multiple filling defects consistent with cholelithiasis. The cystic duct is occluded. A 0.035 wire was then coiled within the gallbladder lumen. The transitional dilator was removed. The transhepatic tract was dilated to 10 [place] with a stiff dilator. A Cook 10.2 [place] all-purpose drainage catheter was then advanced over the wire and formed in the gallbladder lumen. Images were obtained and stored for the medical record. The catheter was gently flushed and connected to gravity bag drainage before being secured to the skin with 0 silk suture. IMPRESSION: Successful placement of 10 French transhepatic percutaneous cholecystostomy tube for an indication of calculus cholecystitis. PLAN: Maintain tube to gravity bag drainage. Patient will likely need home health to assist with care and cleaning of the tube site. Return to interventional radiology approximately every 8 weeks for  cholecystostomy tube check and exchange. If patient's clinical status improves in the future, elective cholecystectomy would be optimal. If patient remains a poor operative candidate, long-term cholecystostomy tube maintenance will likely be required. Electronically Signed   By: Jacqulynn Cadet M.D.   On: 12/20/2020 13:50   DG Chest Port 1 View  Result Date: 12/17/2020 CLINICAL DATA:  Questionable sepsis.  Weakness EXAM: PORTABLE CHEST 1 VIEW COMPARISON:  03/09/2020 FINDINGS: Left subclavian approached chest port remains in place. Stable cardiomegaly. Chronic left basilar scarring or atelectasis. Peripheral left upper lobe nodule better seen on prior CT. No new airspace consolidation. Pleural effusion or pneumothorax. IMPRESSION: Chronic left basilar scarring or atelectasis. No new or acute findings. Electronically Signed   By: Davina Poke D.O.   On: 12/17/2020 13:20   ECHOCARDIOGRAM COMPLETE  Result Date: 12/21/2020    ECHOCARDIOGRAM REPORT   Patient Name:   CARMINO OCAIN Date of Exam: 12/21/2020 Medical Rec #:  [id number]    Height:       72.0 in Accession #:    [id number]   Weight:       137.1 lb Date of Birth:  1950/12/23     BSA:          1.815 m Patient Age:    50 years     BP:           114/80 mmHg Patient Gender: M            HR:           89 bpm. Exam Location:  [facility] Procedure: 2D Echo, Color Doppler and Cardiac Doppler Indications:     Endocarditis I38  History:         Patient has prior history of Echocardiogram examinations, most                  recent 08/19/2019. Arrythmias:Atrial Fibrillation; Risk                  Factors:Hypertension. Substance abuse.  Sonographer:     Sherrie Sport Referring Phys:  [place] Diagnosing Phys: Serafina Royals MD IMPRESSIONS  1. Left ventricular ejection fraction, by estimation, is 60 to 65%. The left ventricle has normal function. The left ventricle has no regional wall motion abnormalities. Left ventricular diastolic parameters were normal.  2. Right  ventricular systolic function is normal. The right ventricular size is normal.  3. The mitral valve is normal in structure. Trivial mitral valve regurgitation.  4. The aortic valve is normal in structure. Aortic valve  regurgitation is not visualized. Mild aortic valve sclerosis is present, with no evidence of aortic valve stenosis. Conclusion(s)/Recommendation(s): No evidence of valve vegetations. FINDINGS  Left Ventricle: Left ventricular ejection fraction, by estimation, is 60 to 65%. The left ventricle has normal function. The left ventricle has no regional wall motion abnormalities. The left ventricular internal cavity size was small. There is no left ventricular hypertrophy. Left ventricular diastolic parameters were normal. Right Ventricle: The right ventricular size is normal. No increase in right ventricular wall thickness. Right ventricular systolic function is normal. Left Atrium: Left atrial size was normal in size. Right Atrium: Right atrial size was normal in size. Pericardium: There is no evidence of pericardial effusion. Mitral Valve: The mitral valve is normal in structure. Trivial mitral valve regurgitation. Tricuspid Valve: The tricuspid valve is normal in structure. Tricuspid valve regurgitation is trivial. Aortic Valve: The aortic valve is normal in structure. Aortic valve regurgitation is not visualized. Mild aortic valve sclerosis is present, with no evidence of aortic valve stenosis. Aortic valve mean gradient measures 3.0 mmHg. Aortic valve peak gradient measures 5.9 mmHg. Aortic valve area, by VTI measures 2.30 cm. Pulmonic Valve: The pulmonic valve was normal in structure. Pulmonic valve regurgitation is not visualized. Aorta: The aortic root and ascending aorta are structurally normal, with no evidence of dilitation. IAS/Shunts: No atrial level shunt detected by color flow Doppler.  LEFT VENTRICLE PLAX 2D LVIDd:         3.87 cm LVIDs:         2.30 cm LV PW:         1.00 cm LV IVS:         1.18 cm LVOT diam:     2.10 cm LV SV:         44 LV SV Index:   24 LVOT Area:     3.46 cm  RIGHT VENTRICLE RV Basal diam:  3.70 cm RV S prime:     12.40 cm/s TAPSE (M-mode): 3.7 cm LEFT ATRIUM           Index       RIGHT ATRIUM           Index LA diam:      3.50 cm 1.93 cm/m  RA Area:     33.70 cm LA Vol (A2C): 57.4 ml 31.63 ml/m RA Volume:   128.00 ml 70.53 ml/m LA Vol (A4C): 72.1 ml 39.73 ml/m  AORTIC VALVE                   PULMONIC VALVE AV Area (Vmax):    2.34 cm    PV Vmax:        0.60 m/s AV Area (Vmean):   2.32 cm    PV Peak grad:   1.4 mmHg AV Area (VTI):     2.30 cm    RVOT Peak grad: 2 mmHg AV Vmax:           121.00 cm/s AV Vmean:          86.000 cm/s AV VTI:            0.193 m AV Peak Grad:      5.9 mmHg AV Mean Grad:      3.0 mmHg LVOT Vmax:         81.60 cm/s LVOT Vmean:        57.600 cm/s LVOT VTI:          0.128 m LVOT/AV VTI ratio: 0.66  AORTA Ao  Root diam: 3.50 cm MITRAL VALVE               TRICUSPID VALVE MV Area (PHT): 4.52 cm    TR Peak grad:   24.4 mmHg MV Decel Time: 168 msec    TR Vmax:        247.00 cm/s MV E velocity: 88.30 cm/s                            SHUNTS                            Systemic VTI:  0.13 m                            Systemic Diam: 2.10 cm Serafina Royals MD Electronically signed by Serafina Royals MD Signature Date/Time: 12/21/2020/5:20:09 PM    Final    US Abdomen Limited RUQ (LIVER/GB)  Result Date: 12/18/2020 CLINICAL DATA:  Evaluate for cholecystitis. EXAM: ULTRASOUND ABDOMEN LIMITED RIGHT UPPER QUADRANT COMPARISON:  CT AP from 12/17/2020 FINDINGS: Gallbladder: The gallbladder wall is diffusely edematous measuring up to 13.1 mm. Gallstones are noted measuring up to 5.9 mm. Pericholecystic fluid. Positive sonographic Murphy's sign. Common bile duct: Diameter: 6 mm Liver: Diffusely increased parenchymal echogenicity identified. No focal liver abnormality. Portal vein is patent on color Doppler imaging with normal direction of blood flow towards the liver.  Other: 1.9 cm right kidney cyst. IMPRESSION: Gallstones, gallbladder wall thickening and pericholecystic fluid with positive sonographic Murphy's sign. Findings are compatible with acute cholecystitis. Electronically Signed   By: Kerby Moors M.D.   On: 12/18/2020 14:18    Assessment and plan-   #Neutropenic sepsis secondary to Klebsiella pneumonia bacteremia Ultrasound findings is compatible with acute cholecystitis.  Likely the source of infection. Urine culture-no growth. S/p cholecystostomy Patient is on IV antibiotics.   #Chemotherapy-induced pancytopenia Counts are improving. ANC normal, hb 9.1 Platelet count has improved to 51,000.  Anticipate his counts to continue to improve to be close to his baseline the next few days.  Okay from hematology to start on Anticoagulation with heparin with no bolus.  # Acute right upper extremity weakness Due to multiple acute infarcts Echocardiogram showed normal vegetation.  Recommend TEE Appreciate neurology And cardiology input. Rule out Infectious embolism due to bacteremia Appreciate ID recommendation for antibiotic course.   # Extensive small cell lung caner,  S/p 1 cycle of chemo. Currently admitted due to chemotherapy induced pancytopenia, sepsis. Counts have improved. Consult palliative care for goals of care discussion.  Overall prognosis is poor given the disease nature. If he is able to recover from acute issues, may consider outpatient cancer treatments If patient continues to decline, to proceed with hospice. I think it is reasonable   12/21/20 I Have discussed and update patient's power of attorney Aniceto Boss Corchran over the phone.  Thank you for allowing me to participate in the care of this patient.   Earlie Server, MD, PhD Hematology Oncology [place] at [facility]  12/22/2020

## 2020-12-23 DIAGNOSIS — F1010 Alcohol abuse, uncomplicated: Secondary | ICD-10-CM | POA: Diagnosis not present

## 2020-12-23 DIAGNOSIS — C7951 Secondary malignant neoplasm of bone: Secondary | ICD-10-CM

## 2020-12-23 DIAGNOSIS — Z515 Encounter for palliative care: Secondary | ICD-10-CM | POA: Diagnosis not present

## 2020-12-23 DIAGNOSIS — I10 Essential (primary) hypertension: Secondary | ICD-10-CM

## 2020-12-23 DIAGNOSIS — K8000 Calculus of gallbladder with acute cholecystitis without obstruction: Secondary | ICD-10-CM

## 2020-12-23 DIAGNOSIS — D709 Neutropenia, unspecified: Secondary | ICD-10-CM | POA: Diagnosis not present

## 2020-12-23 DIAGNOSIS — E538 Deficiency of other specified B group vitamins: Secondary | ICD-10-CM

## 2020-12-23 DIAGNOSIS — D696 Thrombocytopenia, unspecified: Secondary | ICD-10-CM | POA: Diagnosis not present

## 2020-12-23 DIAGNOSIS — E43 Unspecified severe protein-calorie malnutrition: Secondary | ICD-10-CM

## 2020-12-23 DIAGNOSIS — D6481 Anemia due to antineoplastic chemotherapy: Secondary | ICD-10-CM | POA: Diagnosis not present

## 2020-12-23 DIAGNOSIS — K819 Cholecystitis, unspecified: Secondary | ICD-10-CM | POA: Diagnosis not present

## 2020-12-23 DIAGNOSIS — A419 Sepsis, unspecified organism: Secondary | ICD-10-CM | POA: Diagnosis not present

## 2020-12-23 DIAGNOSIS — I484 Atypical atrial flutter: Secondary | ICD-10-CM | POA: Diagnosis not present

## 2020-12-23 DIAGNOSIS — D701 Agranulocytosis secondary to cancer chemotherapy: Secondary | ICD-10-CM | POA: Diagnosis not present

## 2020-12-23 LAB — BASIC METABOLIC PANEL
Anion gap: 8 (ref 5–15)
BUN: 17 mg/dL (ref 8–23)
CO2: 24 mmol/L (ref 22–32)
Calcium: 7.6 mg/dL — ABNORMAL LOW (ref 8.9–10.3)
Chloride: 109 mmol/L (ref 98–111)
Creatinine, Ser: 0.69 mg/dL (ref 0.61–1.24)
GFR, Estimated: >60 mL/min (ref >60)
Glucose, Bld: 90 mg/dL (ref 70–99)
Potassium: 3 mmol/L — ABNORMAL LOW (ref 3.5–5.1)
Sodium: 141 mmol/L (ref 135–145)

## 2020-12-23 LAB — CBC
HCT: 26 % — ABNORMAL LOW (ref 39.0–52.0)
Hemoglobin: 9 g/dL — ABNORMAL LOW (ref 13.0–17.0)
MCH: 33.5 pg (ref 26.0–34.0)
MCHC: 34.6 g/dL (ref 30.0–36.0)
MCV: 96.7 fL (ref 80.0–100.0)
Platelets: 83 10*3/uL — ABNORMAL LOW (ref 150–400)
RBC: 2.69 MIL/uL — ABNORMAL LOW (ref 4.22–5.81)
RDW: 13.9 % (ref 11.5–15.5)
WBC: 11.1 10*3/uL — ABNORMAL HIGH (ref 4.0–10.5)
nRBC: 0.4 % — ABNORMAL HIGH (ref 0.0–0.2)

## 2020-12-23 LAB — MAGNESIUM: Magnesium: 1.7 mg/dL (ref 1.7–2.4)

## 2020-12-23 MED ORDER — POTASSIUM CHLORIDE CRYS ER 20 MEQ PO TBCR
40.0000 meq | EXTENDED_RELEASE_TABLET | ORAL | Status: AC
  Administered 2020-12-23 (×2): 40 meq via ORAL
  Filled 2020-12-23 (×2): qty 2

## 2020-12-23 NOTE — Progress Notes (Addendum)
Physical Therapy Treatment Patient Details Name: Scott Jordan MRN: [id number] DOB: 02-16-1951 Today's Date: 12/23/2020   History of Present Illness  Pt is a 70 y.o. M arriving to ED for c/o nausea, vomiting and admitted for neutropenic sepsis,severe neutropenia, cholecystitis. PMH includes stage IV small cell carcinoma, anemia, thrombocytopenia, hx of substance abuse, chronic hepatitis C, HTN., a-fib. During hospitalization has been complicated by acute CVA with residual right-sided weakness. During hospitalization multifocal acute embolic strokes occured in bilateral cerebral hemisphere & L cerebellum.    PT Comments    Pt in bed, fatigued, denied OOB mobility but agreeable to in bed therapeutic activity. Pt education provided on importance of movement with pt stating general malaise and fatigue prevent further mobility. Performed therapeutic exercise to maintain current AROM and strength. Pt is able to lift BLE against gravity and perform SLRs with prompts. Increased L ankle/foot edema noted without apparent pitting. Nursing notified. Resting HR 88-100 bpm, intermittent a-fib. Nursing agreeable to allow pt tx. HR kept below 110 bpm throughout session. Skilled PT intervention is indicated to address deficits in function, mobility, and to return to PLOF as able.  Discharge recommendations remain SNF.   Recommendations for follow up therapy are one component of a multi-disciplinary discharge planning process, led by the attending physician.  Recommendations may be updated based on patient status, additional functional criteria and insurance authorization.  Follow Up Recommendations        Equipment Recommendations       Recommendations for Other Services       Precautions / Restrictions       Mobility  Bed Mobility                    Transfers                    Ambulation/Gait                 Stairs             Wheelchair Mobility    Modified  Rankin (Stroke Patients Only)       Balance                                            Cognition                                              Exercises      General Comments        Pertinent Vitals/Pain      Home Living                      Prior Function            PT Goals (current goals can now be found in the care plan section)      Frequency           PT Plan      Co-evaluation              AM-PAC PT "6 Clicks" Mobility   Outcome Measure                   End of Session  Time:  -     Charges:                        [employer], SPT

## 2020-12-23 NOTE — TOC Initial Note (Signed)
Transition of Care [facility]) - Initial/Assessment Note    Patient Details  Name: Scott Jordan MRN: [id number] Date of Birth: 1950-04-14  Transition of Care [facility]) CM/SW Contact:    Eileen Stanford, LCSW Phone Number: 12/23/2020, 2:47 PM  Clinical Narrative:   Billey Chang with hospice asked that CSW speak to pt's niece as per his conversation the patient/family would like to pursue SNF. CSW spoke with pt's niece and she is agreeable for pt to go to SNF. CSW emailed list to pt's niece for her to review. Pt's niece gave CSW verbal permission to send referall to [facility]. CSW will provide bed offers once available.              Expected Discharge Plan: Skilled Nursing Facility Barriers to Discharge: Continued Medical Work up   Patient Goals and CMS Choice Patient states their goals for this hospitalization and ongoing recovery are:: for pt to go to snf-  [facility] CMS Medicare.gov Compare Post Acute Care list provided to:: Other (Comment Required) (emailed list to [facility]) Choice offered to / list presented to :  (neice)  Expected Discharge Plan and Services Expected Discharge Plan: [place] In-house Referral: Clinical Social Work   Post Acute Care Choice: [place] Living arrangements for the past 2 months: [place]                                      Prior Living Arrangements/Services Living arrangements for the past 2 months: Single Family Home Lives with:: Self Patient language and need for interpreter reviewed:: Yes Do you feel safe going back to the place where you live?: Yes      Need for Family Participation in Patient Care: Yes (Comment) Care giver support system in place?: Yes (comment)   Criminal Activity/Legal Involvement Pertinent to Current Situation/Hospitalization: No - Comment as needed  Activities of Daily Living Home Assistive Devices/Equipment: Dentures (specify type) ADL Screening (condition at time of  admission) Patient's cognitive ability adequate to safely complete daily activities?: Yes Is the patient deaf or have difficulty hearing?: No Does the patient have difficulty seeing, even when wearing glasses/contacts?: No Does the patient have difficulty concentrating, remembering, or making decisions?: No Patient able to express need for assistance with ADLs?: Yes Does the patient have difficulty dressing or bathing?: Yes Independently performs ADLs?: No Communication: Independent Dressing (OT): Needs assistance Is this a change from baseline?: Change from baseline, expected to last <3days Grooming: Needs assistance Is this a change from baseline?: Change from baseline, expected to last <3 days Feeding: Needs assistance Is this a change from baseline?: Change from baseline, expected to last <3 days Bathing: Needs assistance Is this a change from baseline?: Change from baseline, expected to last <3 days Toileting: Needs assistance Is this a change from baseline?: Change from baseline, expected to last <3 days In/Out Bed: Needs assistance Is this a change from baseline?: Change from baseline, expected to last >3 days Walks in Home: Appropriate for developmental age Does the patient have difficulty walking or climbing stairs?: Yes Weakness of Legs: Both Weakness of Arms/Hands: Both  Permission Sought/Granted Permission sought to share information with : Family Supports    Share Information with NAME: Ronny Bacon     Permission granted to share info w Relationship: neice     Emotional Assessment Appearance:: Appears stated age     Orientation: : Oriented  to Self, Oriented to Place, Oriented to  Time, Oriented to Situation Alcohol / Substance Use: Not Applicable Psych Involvement: No (comment)  Admission diagnosis:  Cholecystitis [K81.9] Thrombocytopenia ([place]) [D69.6] Small cell lung cancer (HCC) [C34.90] Chemotherapy-induced neutropenia (HCC) [D70.1, T45.1X5A] Chemotherapy  induced neutropenia (HCC) [D70.1, T45.1X5A] Pancytopenia ([place]) [D61.818] Septic shock ([place]) [A41.9, R65.21] Sepsis ([place]) [A41.9] Antineoplastic chemotherapy induced anemia [D64.81, T45.1X5A] Neutropenic sepsis ([place]) [A41.9, D70.9] Patient Active Problem List   Diagnosis Date Noted   Cerebrovascular accident (CVA) due to embolism of precerebral artery ([place])    Palliative care encounter    Cholelithiasis with acute cholecystitis 12/20/2020   Cholecystitis    Septicemia due to Klebsiella pneumoniae ([place]) 12/18/2020   Neutropenic sepsis ([place]) 12/17/2020   Head trauma 06/24/2020   Ulnar neuropathy of left upper extremity 05/13/2020   Cervical radiculopathy 05/13/2020   B12 deficiency 04/06/2020   Weight loss 03/23/2020   Neuropathy of left upper extremity 03/22/2020   Hepatitis B infection without delta agent without hepatic coma 12/30/2019   Chronic hepatitis C without hepatic coma ([place]) 12/30/2019   Chronic anticoagulation 12/23/2019   Elevated LFTs 12/23/2019   Hypokalemia 12/08/2019   Acute respiratory failure with hypoxia ([place]) 11/23/2019   Heroin overdose, accidental or unintentional, initial encounter ([place]) 11/23/2019   Aspiration pneumonia ([place]) 11/23/2019   Pancytopenia ([place]) 11/23/2019   Atypical atrial flutter ([place]) 11/23/2019   Hypomagnesemia 10/21/2019   Antineoplastic chemotherapy induced anemia 10/13/2019   Dehydration 10/13/2019   Thrombocytopenia ([place]) 10/13/2019   Chemotherapy-induced neutropenia (HCC) 10/10/2019   Anemia 09/11/2019   Cancer-related pain 09/05/2019   Encounter for antineoplastic chemotherapy 09/02/2019   Encounter for antineoplastic immunotherapy 09/02/2019   Bone metastasis ([place]) 09/02/2019   Goals of care, counseling/discussion 08/26/2019   Small cell lung cancer ([place]) 08/23/2019   Protein-calorie malnutrition, severe 08/19/2019   Hyponatremia    Dyspnea    Pleural effusion    Recurrent pleural effusion on left    Malignant pleural  effusion 08/17/2019   Alcohol abuse 07/21/2019   History of substance abuse ([place]) 06/18/2019   Hypertension, essential 06/18/2019   Malnutrition of moderate degree 01/15/2019   Elevated troponin 01/14/2019   PCP:  Earlie Server, MD Pharmacy:   [facility] [place], [place] AT [facility] [address] [place] [address] Phone: [phone] Fax: [phone]     Social Determinants of Health (SDOH) Interventions    Readmission Risk Interventions No flowsheet data found.

## 2020-12-23 NOTE — Progress Notes (Signed)
OT Cancellation Note  Patient Details Name: Scott Jordan MRN: [id number] DOB: 03/12/1951   Cancelled Treatment:    Reason Eval/Treat Not Completed: Patient declined, no reason specified. Attempted to see pt this AM after pt requesting OT return this date following patient refusal on Monday. Pt continuing to decline, stating "I just dont feel well. I want to feel better before doing anything". OT attempted to educate pt on benefits of engaging in ADLs/functional mobility however pt continuing to decline and requesting OT to re-attempt Friday. OT to re-attempt at later time/date as able.   Fredirick Maudlin, OTR/L [place]

## 2020-12-23 NOTE — Progress Notes (Signed)
[place]  [phone] [phone]   Name: Scott Jordan Date: 12/23/2020 MRN: [id number]  DOB: October 02, 1950  Patient Care Team: Earlie Server, MD as PCP - General (Oncology) Telford Nab, RN as Registered Nurse Erby Pian, MD as Referring Physician (Specialist) Teodoro Spray, MD as Consulting Physician (Cardiology) Anthonette Legato, MD as Consulting Physician (Nephrology) Vladimir Crofts, MD as Consulting Physician (Neurology) Meade Maw, MD as Consulting Physician (Neurosurgery)    REASON FOR CONSULTATION: Scott Jordan is a 71 y.o. male with multiple medical problems including stage IV small cell carcinoma on chemotherapy, protein calorie malnutrition, anemia, history of substance and alcohol abuse, HCV, history of aspiration pneumonia, who was admitted to hospital on 12/17/2020 with sepsis and Klebsiella bacteremia.  Abdominal ultrasound confirmed cholelithiasis with cholecystitis and patient underwent IR guided cholecystostomy on 9/11.  Unfortunately, his hospitalization has been complicated by acute CVA with residual right-sided weakness.  Palliative care was consulted to address goals..   CODE STATUS: DNR  PAST MEDICAL HISTORY: Past Medical History:  Diagnosis Date   A-fib ([facility]) 01/10/2019   pt st this was dx by Dr. Ubaldo Glassing   Cancer [facility]) 1287   LUNG   Complication of anesthesia    DIFFICULTY WAKING UP AFTER SURGERY- 20 YRS AGO   Hypertension    Lung cancer ([place])    Substance abuse ([place])     PAST SURGICAL HISTORY:  Past Surgical History:  Procedure Laterality Date   ARM DEBRIDEMENT Left    INCISION AND DEBRIDEMENT LOWER ARM -20 YRS AGO   BACK SURGERY     IR PERC CHOLECYSTOSTOMY  12/20/2020   PORTACATH PLACEMENT Left 08/29/2019   Procedure: INSERTION PORT-A-CATH;  Surgeon: Nestor Lewandowsky, MD;  Location: [facility] ORS;  Service: General;  Laterality: Left;    HEMATOLOGY/ONCOLOGY HISTORY:  Oncology  History  Small cell lung cancer ([place])  08/23/2019 Initial Diagnosis   Small cell lung cancer ([place])   09/02/2019 -  Chemotherapy    Patient is on Treatment Plan: LUNG SCLC CARBOPLATIN + ETOPOSIDE + ATEZOLIZUMAB INDUCTION Q21D / ATEZOLIZUMAB MAINTENANCE Q21D         ALLERGIES:  has No Known Allergies.  MEDICATIONS:  Current Facility-Administered Medications  Medication Dose Route Frequency Provider Last Rate Last Admin   (feeding supplement) PROSource Plus liquid 30 mL  30 mL Oral BID BM Sharen Hones, MD   30 mL at 12/21/20 1056   0.9 %  sodium chloride infusion (Manually program via Guardrails IV Fluids)   Intravenous Once Sharen Hones, MD       0.9 %  sodium chloride infusion (Manually program via Guardrails IV Fluids)   Intravenous Once Sharen Hones, MD       0.9 %  sodium chloride infusion   Intravenous PRN Sharen Hones, MD   Stopped at 12/20/20 1551   0.9 %  sodium chloride infusion    Continuous PRN Criselda Peaches, MD 10 mL/hr at 12/20/20 1220 10 mL/hr at 12/20/20 1220   0.9 % NaCl with KCl 20 mEq/ L  infusion   Intravenous Continuous Sharen Hones, MD 50 mL/hr at 12/21/20 1835 Infusion Verify at 12/21/20 1835   acetaminophen (TYLENOL) tablet 650 mg  650 mg Oral Q6H PRN Cox, Amy N, DO       Or   acetaminophen (TYLENOL) suppository 650 mg  650 mg Rectal Q6H PRN Cox, Amy N, DO       aspirin chewable tablet 81 mg  81 mg Oral Daily Derek Jack, MD   81 mg at 12/23/20 0836   cefTRIAXone (ROCEPHIN) 2 g in sodium chloride 0.9 % 100 mL IVPB  2 g Intravenous Q24H Sharen Hones, MD 200 mL/hr at 12/23/20 0625 2 g at 12/23/20 0625   chlorhexidine (PERIDEX) 0.12 % solution 10 mL  10 mL Mouth/Throat BID Earlie Server, MD   10 mL at 12/23/20 7106   Chlorhexidine Gluconate Cloth 2 % PADS 6 each  6 each Topical Daily Cox, Amy N, DO   6 each at 12/23/20 1036   diltiazem (CARDIZEM CD) 24 hr capsule 240 mg  240 mg Oral Daily Cox, Amy N, DO   240 mg at 12/23/20 2694   diltiazem (CARDIZEM) injection  10 mg  10 mg Intravenous Q2H PRN Cox, Amy N, DO       dronabinol (MARINOL) capsule 2.5 mg  2.5 mg Oral BID AC Sharen Hones, MD   2.5 mg at 12/23/20 1252   feeding supplement (BOOST / RESOURCE BREEZE) liquid 1 Container  1 Container Oral TID BM Sharen Hones, MD   1 Container at 12/23/20 0840   feeding supplement (ENSURE ENLIVE / ENSURE PLUS) liquid 237 mL  237 mL Oral TID BM Cox, Amy N, DO   237 mL at 12/23/20 0839   fentaNYL (SUBLIMAZE) injection    PRN Criselda Peaches, MD   50 mcg at 85/46/27 0350   folic acid (FOLVITE) tablet 1 mg  1 mg Oral Daily Sharen Hones, MD   1 mg at 12/23/20 0836   HYDROmorphone (DILAUDID) injection    PRN Criselda Peaches, MD   0.5 mg at 12/20/20 1259   metroNIDAZOLE (FLAGYL) IVPB 500 mg  500 mg Intravenous Q12H Max Sane, MD 100 mL/hr at 12/23/20 0831 500 mg at 12/23/20 0831   midazolam (VERSED) injection    PRN Criselda Peaches, MD   1 mg at 12/20/20 1250   multivitamin with minerals tablet 1 tablet  1 tablet Oral Daily Sharen Hones, MD   1 tablet at 12/23/20 0836   mupirocin ointment (BACTROBAN) 2 % 1 application  1 application Nasal BID Cox, Amy N, DO   1 application at 09/38/18 0841   ondansetron (ZOFRAN) tablet 4 mg  4 mg Oral Q6H PRN Cox, Amy N, DO       Or   ondansetron (ZOFRAN) injection 4 mg  4 mg Intravenous Q6H PRN Cox, Amy N, DO       oxyCODONE-acetaminophen (PERCOCET/ROXICET) 5-325 MG per tablet 1 tablet  1 tablet Oral Q6H PRN Mansy, Jan A, MD   1 tablet at 12/22/20 2121   sodium chloride flush (NS) 0.9 % injection 10-40 mL  10-40 mL Intracatheter Q12H Manuella Ghazi, Vipul, MD   10 mL at 12/23/20 0840   sodium chloride flush (NS) 0.9 % injection 10-40 mL  10-40 mL Intracatheter PRN Max Sane, MD       sodium chloride flush (NS) 0.9 % injection 5 mL  5 mL Intracatheter Q8H Criselda Peaches, MD   5 mL at 12/23/20 2993   thiamine tablet 100 mg  100 mg Oral Daily Sharen Hones, MD   100 mg at 12/23/20 7169   Facility-Administered Medications Ordered in  Other Encounters  Medication Dose Route Frequency Provider Last Rate Last Admin   heparin lock flush 100 unit/mL  500 Units Intravenous Once Corcoran, Melissa C, MD       sodium chloride flush (NS) 0.9 % injection 10 mL  10 mL Intravenous PRN Nolon Stalls C, MD   10 mL at 11/11/19 0812    VITAL SIGNS: BP (!) 146/96 (BP Location: Right Arm)   Pulse (!) 104   Temp 98.1 F (36.7 C) (Oral)   Resp 20   Ht 6' (1.829 m)   Wt 137 lb 2 oz (62.2 kg)   SpO2 94%   BMI 18.60 kg/m  Filed Weights   12/17/20 1203 12/18/20 2130  Weight: 136 lb (61.7 kg) 137 lb 2 oz (62.2 kg)    Estimated body mass index is 18.6 kg/m as calculated from the following:   Height as of this encounter: 6' (1.829 m).   Weight as of this encounter: 137 lb 2 oz (62.2 kg).  LABS: CBC:    Component Value Date/Time   WBC 11.1 (H) 12/23/2020 0305   HGB 9.0 (L) 12/23/2020 0305   HCT 26.0 (L) 12/23/2020 0305   PLT 83 (L) 12/23/2020 0305   MCV 96.7 12/23/2020 0305   NEUTROABS 3.5 12/21/2020 0434   LYMPHSABS 0.4 (L) 12/21/2020 0434   MONOABS 0.7 12/21/2020 0434   EOSABS 0.0 12/21/2020 0434   BASOSABS 0.1 12/21/2020 0434   Comprehensive Metabolic Panel:    Component Value Date/Time   NA 141 12/23/2020 0305   K 3.0 (L) 12/23/2020 0305   CL 109 12/23/2020 0305   CO2 24 12/23/2020 0305   BUN 17 12/23/2020 0305   CREATININE 0.69 12/23/2020 0305   GLUCOSE 90 12/23/2020 0305   CALCIUM 7.6 (L) 12/23/2020 0305   AST 51 (H) 12/17/2020 1205   ALT 32 12/17/2020 1205   ALKPHOS 50 12/17/2020 1205   BILITOT 1.8 (H) 12/17/2020 1205   PROT 6.3 (L) 12/17/2020 1205   ALBUMIN 3.1 (L) 12/17/2020 1205    RADIOGRAPHIC STUDIES: CT ABDOMEN PELVIS WO CONTRAST  Result Date: 12/17/2020 CLINICAL DATA:  Nausea and vomiting EXAM: CT ABDOMEN AND PELVIS WITHOUT CONTRAST TECHNIQUE: Multidetector CT imaging of the abdomen and pelvis was performed following the standard protocol without IV contrast. COMPARISON:  CT 06/23/2020, PET CT  11/19/2020 FINDINGS: Lower chest: Lung bases demonstrate emphysema. Subpleural bandlike airspace disease at the left lower lobe redemonstrated. Incompletely visualized subpleural peripheral left upper lobe nodule measuring 11 mm, series 5, image 1. Cardiomegaly with trace pericardial effusion. Coronary vascular calcification. Hepatobiliary: Extensive motion degradation. No focal hepatic abnormality. Gallstones. Gallbladder slightly distended. Possible wall thickening or pericholecystic fluid though limited by motion Pancreas: Unremarkable. No pancreatic ductal dilatation or surrounding inflammatory changes. Spleen: Normal in size without focal abnormality. Adrenals/Urinary Tract: Thickened appearance of adrenal glands. No hydronephrosis. The bladder is unremarkable Stomach/Bowel: Stomach nonenlarged. No dilated small bowel. Negative appendix. No acute bowel wall thickening Vascular/Lymphatic: Advanced aortic atherosclerosis. No aneurysm. Slightly increased multiple small retroperitoneal lymph nodes. Reproductive: Prostate is unremarkable. Other: Negative for free air or free fluid Musculoskeletal: No acute osseous abnormality. IMPRESSION: 1. Degraded by motion. 2. Gallbladder appears slightly distended and there are gallstones. Possible gallbladder wall thickening or pericholecystic fluid though limited by motion, consider correlation with ultrasound 3. Cardiomegaly with trace pericardial effusion. 4. Similar subpleural bandlike density at left lower lobe. Incompletely visualized peripheral left upper lobe lung nodule characterized on recent PET CT Electronically Signed   By: Donavan Foil M.D.   On: 12/17/2020 16:50   CT ANGIO HEAD NECK W WO CM  Result Date: 12/21/2020 CLINICAL DATA:  Neuro deficit, acute, stroke suspected. Neutropenic sepsis. EXAM: CT ANGIOGRAPHY HEAD AND NECK TECHNIQUE: Multidetector CT imaging of the head and neck  was performed using the standard protocol during bolus administration of  intravenous contrast. Multiplanar CT image reconstructions and MIPs were obtained to evaluate the vascular anatomy. Carotid stenosis measurements (when applicable) are obtained utilizing NASCET criteria, using the distal internal carotid diameter as the denominator. CONTRAST:  72m OMNIPAQUE IOHEXOL 350 MG/ML SOLN COMPARISON:  MRI of the brain December 20, 2020. FINDINGS: CT HEAD FINDINGS Brain: Confluent hypodensity of the white matter of the cerebral hemispheres, nonspecific, most likely related to chronic small vessel ischemia. Foci of acute infarct are more conspicuous on recent MRI of the brain. No new large territorial infarct. No hemorrhage, hydrocephalus, mass or midline shift. Vascular: No hyperdense vessel or unexpected calcification. Skull: Normal. Negative for fracture or focal lesion. Sinuses: Increased thickness of the walls of the bilateral maxillary sinuses with bubbly secretion on the left. Findings are suggestive of chronic sinusitis. Right mastoid effusion. Orbits: No acute finding. Review of the MIP images confirms the above findings CTA NECK FINDINGS Aortic arch: Standard branching. Imaged portion shows no evidence of aneurysm or dissection. Mild atherosclerotic changes of the aortic arch without significant stenosis of the major arch vessel origins. Right carotid system: Calcified atherosclerotic disease of the right carotid bifurcation without hemodynamically significant stenosis. Left carotid system: Atherosclerotic disease of the left carotid bifurcation with mixed density plaques without hemodynamically significant stenosis. Vertebral arteries: Calcified atherosclerotic plaque at the origin of the right vertebral artery without hemodynamically significant stenosis. Mild atherosclerotic changes at the origin of the left vertebral artery without hemodynamically significant stenosis. Cervical vertebral arteries otherwise have normal caliber Skeleton: Degenerative changes of the cervical spine  with fusion of the facet joints at C2-3 on the left. No acute findings. Other neck: Negative. Upper chest: Left-sided pleural effusion and atelectasis. Mediastinal lymphadenopathy. Review of the MIP images confirms the above findings CTA HEAD FINDINGS Anterior circulation: Calcified atherosclerotic plaques in the bilateral carotid siphons. No significant stenosis, proximal occlusion, aneurysm, or vascular malformation. Posterior circulation: No significant stenosis, proximal occlusion, aneurysm, or vascular malformation. Hypoplastic/aplastic right P1/PCA segment with right fetal PCA. Venous sinuses: As permitted by contrast timing, patent. Anatomic variants: Right fetal PCA. Review of the MIP images confirms the above findings IMPRESSION: 1. Advanced chronic microvascular ischemic changes of the white matter. 2. Atherosclerotic disease of the bilateral carotid bifurcation without hemodynamically significant stenosis. 3. Mild atherosclerotic changes at the origin of the bilateral vertebral arteries without hemodynamically significant stenosis. 4. Mild intracranial atherosclerotic disease without hemodynamically significant stenosis. Electronically Signed   By: KPedro EarlsM.D.   On: 12/21/2020 15:32   CT HEAD WO CONTRAST (5MM)  Result Date: 12/17/2020 CLINICAL DATA:  Nausea vomiting EXAM: CT HEAD WITHOUT CONTRAST TECHNIQUE: Contiguous axial images were obtained from the base of the skull through the vertex without intravenous contrast. COMPARISON:  CT brain 06/24/2020, MRI 03/10/2020, head CT 11/22/2019 FINDINGS: Brain: No acute territorial infarction, hemorrhage or intracranial mass. Extensive white matter disease, progressed from more remote exams from 2021. Stable ventricle size. Chronic lacunar infarcts within the left white matter. Vascular: No hyperdense vessel.  Carotid vascular calcification Skull: Normal. Negative for fracture or focal lesion. Sinuses/Orbits: Mucosal thickening the  sinuses. Chronic appearing nasal deformity Other: None IMPRESSION: 1. No definite CT evidence for acute intracranial abnormality. 2. Atrophy. Extensive white matter disease, grossly stable as compared with recent head CT from March, appears progressive when compared to exams from 2021 and prior Electronically Signed   By: KDonavan FoilM.D.   On: 12/17/2020 16:39  MR BRAIN W WO CONTRAST  Addendum Date: 12/21/2020   ADDENDUM REPORT: 12/21/2020 14:49 ADDENDUM: Patient returns for repeat postcontrast imaging. There is no abnormal enhancement. No evidence of intracranial metastatic disease. Electronically Signed   By: Macy Mis M.D.   On: 12/21/2020 14:49   Result Date: 12/21/2020 CLINICAL DATA:  Neuro deficit, acute, stroke suspected Small cell lung cancer, monitor EXAM: MRI HEAD WITHOUT AND WITH CONTRAST TECHNIQUE: Multiplanar, multiecho pulse sequences of the brain and surrounding structures were obtained without and with intravenous contrast. CONTRAST:  7.2m GADAVIST GADOBUTROL 1 MMOL/ML IV SOLN COMPARISON:  CT head 12/17/2020. FINDINGS: Mildly motion limited exam.  Within this limitation: Brain: Multiple small areas of restricted diffusion in the bilateral frontal and occipital, and left parietal matter, left perirolandic cortex, left thalamocapsular region, and left cerebellum. Advanced confluent T2 hyperintensity within the white matter, nonspecific but compatible with chronic microvascular ischemic disease and progressed since 2021. Small remote infarct in the left corona radiata. No hydrocephalus, obvious mass lesion, midline shift, acute hemorrhage, or extra-axial fluid collection. Punctate foci of susceptibility artifact in the left cerebellum and left frontal white matter, nonspecific but potentially related to prior microhemorrhage. Contrast was administered; however, there is no evidence of enhancement (for example, the nasal mucosa/turbinates are nonenhancing). This precludes evaluation for  enhancing lesions. Vascular: Major arterial flow voids are maintained at the skull base. Skull and upper cervical spine: Normal marrow signal. Sinuses/Orbits: Mild paranasal sinus disease. Other: Moderate right and small left mastoid effusions. IMPRESSION: 1. Multiple small areas of restricted diffusion in the bilateral frontal and occipital and left parietal white matter, left perirolandic cortex, left thalamocapsular region, and left cerebellum, which are concerning for acute infarcts. Given involvement of multiple vascular territories, consider a central embolic etiology. 2. Contrast was administered; however, there is no evidence of enhancement, unclear why given reported uneventful injection per the technologist. This precludes evaluation for enhancing lesions. Given the patient's known small cell lung cancer diagnosis, recommend repeat postcontrast imaging to exclude metastatic disease. I've asked the MRI technologist to not bill the patient for the repeat post-contrast imaging. 3. Advanced chronic microvascular ischemic disease, progressed since 2021. 4. Moderate right and small left mastoid effusions. Electronically Signed: By: FMargaretha SheffieldM.D. On: 12/20/2020 14:29   IR Perc Cholecystostomy  Result Date: 12/20/2020 INDICATION: 70year old male with small cell lung cancer and chemotherapy induced pancytopenia presenting with neutropenic sepsis. Blood cultures grew out Klebsiella and imaging findings are concerning for acute cholecystitis. He is too sick and frail to be considered an operative candidate. Following transfusion of platelets, his platelets are now at an acceptable level to proceed with percutaneous cholecystostomy tube placement. EXAM: CHOLECYSTOSTOMY MEDICATIONS: In patient currently receiving intravenous antibiotic therapy. No additional antibiotic prophylaxis administered. ANESTHESIA/SEDATION: Moderate (conscious) sedation was employed during this procedure. A total of Versed 1 mg  and Fentanyl 50 mcg and 0.5 mg Dilaudid administered intravenously. Moderate Sedation Time: 13 minutes. The patient's level of consciousness and vital signs were monitored continuously by radiology nursing throughout the procedure under my direct supervision. FLUOROSCOPY TIME:  Fluoroscopy Time: 0 minutes 30 seconds (4.3 mGy). COMPLICATIONS: None immediate. PROCEDURE: Informed written consent was obtained from the patient after a thorough discussion of the procedural risks, benefits and alternatives. All questions were addressed. Maximal Sterile Barrier Technique was utilized including caps, mask, sterile gowns, sterile gloves, sterile drape, hand hygiene and skin antiseptic. A timeout was performed prior to the initiation of the procedure. The right upper quadrant was interrogated with ultrasound.  The distended gallbladder with a thickened wall was successfully identified. There is significant excursion of the liver with respiration due to use of abdominal muscles. A suitable skin entry site was selected. Local anesthesia was attained by infiltration with 1% lidocaine. A small dermatotomy was made. Under real-time ultrasound guidance, the movement of the liver was timed with respirations and in a single pass, a 21 gauge Accustick needle was advanced through the liver and into the gallbladder lumen along a short transhepatic course. A 0.018 wire was quickly advanced into the gallbladder lumen and the needle was exchanged for the transitional dilator. The transitional dilator was advanced into the gallbladder lumen. The wire was removed. Contrast injection was performed under fluoroscopy confirming opacification of the gallbladder lumen. Multiple filling defects consistent with cholelithiasis. The cystic duct is occluded. A 0.035 wire was then coiled within the gallbladder lumen. The transitional dilator was removed. The transhepatic tract was dilated to 10 [place] with a stiff dilator. A Cook 10.2 [place] all-purpose  drainage catheter was then advanced over the wire and formed in the gallbladder lumen. Images were obtained and stored for the medical record. The catheter was gently flushed and connected to gravity bag drainage before being secured to the skin with 0 silk suture. IMPRESSION: Successful placement of 10 French transhepatic percutaneous cholecystostomy tube for an indication of calculus cholecystitis. PLAN: Maintain tube to gravity bag drainage. Patient will likely need home health to assist with care and cleaning of the tube site. Return to interventional radiology approximately every 8 weeks for cholecystostomy tube check and exchange. If patient's clinical status improves in the future, elective cholecystectomy would be optimal. If patient remains a poor operative candidate, long-term cholecystostomy tube maintenance will likely be required. Electronically Signed   By: Jacqulynn Cadet M.D.   On: 12/20/2020 13:50   DG Chest Port 1 View  Result Date: 12/17/2020 CLINICAL DATA:  Questionable sepsis.  Weakness EXAM: PORTABLE CHEST 1 VIEW COMPARISON:  03/09/2020 FINDINGS: Left subclavian approached chest port remains in place. Stable cardiomegaly. Chronic left basilar scarring or atelectasis. Peripheral left upper lobe nodule better seen on prior CT. No new airspace consolidation. Pleural effusion or pneumothorax. IMPRESSION: Chronic left basilar scarring or atelectasis. No new or acute findings. Electronically Signed   By: Davina Poke D.O.   On: 12/17/2020 13:20   ECHOCARDIOGRAM COMPLETE  Result Date: 12/21/2020    ECHOCARDIOGRAM REPORT   Patient Name:   AMROM ORE Date of Exam: 12/21/2020 Medical Rec #:  [id number]    Height:       72.0 in Accession #:    [id number]   Weight:       137.1 lb Date of Birth:  1950/09/20     BSA:          1.815 m Patient Age:    23 years     BP:           114/80 mmHg Patient Gender: M            HR:           89 bpm. Exam Location:  [facility] Procedure: 2D Echo, Color Doppler and  Cardiac Doppler Indications:     Endocarditis I38  History:         Patient has prior history of Echocardiogram examinations, most                  recent 08/19/2019. Arrythmias:Atrial Fibrillation; Risk  Factors:Hypertension. Substance abuse.  Sonographer:     Sherrie Sport Referring Phys:  Michie Diagnosing Phys: Serafina Royals MD IMPRESSIONS  1. Left ventricular ejection fraction, by estimation, is 60 to 65%. The left ventricle has normal function. The left ventricle has no regional wall motion abnormalities. Left ventricular diastolic parameters were normal.  2. Right ventricular systolic function is normal. The right ventricular size is normal.  3. The mitral valve is normal in structure. Trivial mitral valve regurgitation.  4. The aortic valve is normal in structure. Aortic valve regurgitation is not visualized. Mild aortic valve sclerosis is present, with no evidence of aortic valve stenosis. Conclusion(s)/Recommendation(s): No evidence of valve vegetations. FINDINGS  Left Ventricle: Left ventricular ejection fraction, by estimation, is 60 to 65%. The left ventricle has normal function. The left ventricle has no regional wall motion abnormalities. The left ventricular internal cavity size was small. There is no left ventricular hypertrophy. Left ventricular diastolic parameters were normal. Right Ventricle: The right ventricular size is normal. No increase in right ventricular wall thickness. Right ventricular systolic function is normal. Left Atrium: Left atrial size was normal in size. Right Atrium: Right atrial size was normal in size. Pericardium: There is no evidence of pericardial effusion. Mitral Valve: The mitral valve is normal in structure. Trivial mitral valve regurgitation. Tricuspid Valve: The tricuspid valve is normal in structure. Tricuspid valve regurgitation is trivial. Aortic Valve: The aortic valve is normal in structure. Aortic valve regurgitation is not visualized.  Mild aortic valve sclerosis is present, with no evidence of aortic valve stenosis. Aortic valve mean gradient measures 3.0 mmHg. Aortic valve peak gradient measures 5.9 mmHg. Aortic valve area, by VTI measures 2.30 cm. Pulmonic Valve: The pulmonic valve was normal in structure. Pulmonic valve regurgitation is not visualized. Aorta: The aortic root and ascending aorta are structurally normal, with no evidence of dilitation. IAS/Shunts: No atrial level shunt detected by color flow Doppler.  LEFT VENTRICLE PLAX 2D LVIDd:         3.87 cm LVIDs:         2.30 cm LV PW:         1.00 cm LV IVS:        1.18 cm LVOT diam:     2.10 cm LV SV:         44 LV SV Index:   24 LVOT Area:     3.46 cm  RIGHT VENTRICLE RV Basal diam:  3.70 cm RV S prime:     12.40 cm/s TAPSE (M-mode): 3.7 cm LEFT ATRIUM           Index       RIGHT ATRIUM           Index LA diam:      3.50 cm 1.93 cm/m  RA Area:     33.70 cm LA Vol (A2C): 57.4 ml 31.63 ml/m RA Volume:   128.00 ml 70.53 ml/m LA Vol (A4C): 72.1 ml 39.73 ml/m  AORTIC VALVE                   PULMONIC VALVE AV Area (Vmax):    2.34 cm    PV Vmax:        0.60 m/s AV Area (Vmean):   2.32 cm    PV Peak grad:   1.4 mmHg AV Area (VTI):     2.30 cm    RVOT Peak grad: 2 mmHg AV Vmax:  121.00 cm/s AV Vmean:          86.000 cm/s AV VTI:            0.193 m AV Peak Grad:      5.9 mmHg AV Mean Grad:      3.0 mmHg LVOT Vmax:         81.60 cm/s LVOT Vmean:        57.600 cm/s LVOT VTI:          0.128 m LVOT/AV VTI ratio: 0.66  AORTA Ao Root diam: 3.50 cm MITRAL VALVE               TRICUSPID VALVE MV Area (PHT): 4.52 cm    TR Peak grad:   24.4 mmHg MV Decel Time: 168 msec    TR Vmax:        247.00 cm/s MV E velocity: 88.30 cm/s                            SHUNTS                            Systemic VTI:  0.13 m                            Systemic Diam: 2.10 cm Serafina Royals MD Electronically signed by Serafina Royals MD Signature Date/Time: 12/21/2020/5:20:09 PM    Final    US Abdomen  Limited RUQ (LIVER/GB)  Result Date: 12/18/2020 CLINICAL DATA:  Evaluate for cholecystitis. EXAM: ULTRASOUND ABDOMEN LIMITED RIGHT UPPER QUADRANT COMPARISON:  CT AP from 12/17/2020 FINDINGS: Gallbladder: The gallbladder wall is diffusely edematous measuring up to 13.1 mm. Gallstones are noted measuring up to 5.9 mm. Pericholecystic fluid. Positive sonographic Murphy's sign. Common bile duct: Diameter: 6 mm Liver: Diffusely increased parenchymal echogenicity identified. No focal liver abnormality. Portal vein is patent on color Doppler imaging with normal direction of blood flow towards the liver. Other: 1.9 cm right kidney cyst. IMPRESSION: Gallstones, gallbladder wall thickening and pericholecystic fluid with positive sonographic Murphy's sign. Findings are compatible with acute cholecystitis. Electronically Signed   By: Kerby Moors M.D.   On: 12/18/2020 14:18    PERFORMANCE STATUS (ECOG) : 3 - Symptomatic, >50% confined to bed  Review of Systems Unless otherwise noted, a complete review of systems is negative.  Physical Exam General: NAD Pulmonary: Unlabored Extremities: no edema, no joint deformities Skin: no rashes Neurological: Weakness but otherwise nonfocal  IMPRESSION: Met with patient, brother, and niece.  Patient reports that he feels lousy today but does not elucidate on specifics.  Patient verbalized that he expects to clinically improve and remains in agreement with current scope of treatment.  Family did not want me to use the word "hospice."  Instead, they would like for him to try rehab to see if that improves his strength.  They do understand that persistent weakness or further decline would exclude cancer treatment and most likely would reflect an end-of-life situation.  We will consult the social worker and plan for patient to be followed by palliative care at rehab.  We discussed CODE STATUS.  Patient stated clearly that he would not want to be resuscitated nor have his  life prolonged artificially machines.  Patient and family were in agreement with DNR/DNI.  PLAN: -Continue current scope of treatment -Dispo: Probable rehab with palliative care following -TOC  to coordinate -DNR/DNI  Case and plan discussed with Dr. Tasia Catchings  Time Total: 25 minutes  Visit consisted of counseling and education dealing with the complex and emotionally intense issues of symptom management and palliative care in the setting of serious and potentially life-threatening illness.Greater than 50%  of this time was spent counseling and coordinating care related to the above assessment and plan.  Signed by: Altha Harm, PhD, NP-C

## 2020-12-23 NOTE — Progress Notes (Signed)
PROGRESS NOTE  Scott Jordan GEX:[id number] DOB: 07/03/1950   PCP: Earlie Server, MD  Patient is from: Home.   DOA: 12/17/2020 LOS: 6  Chief complaints:  Chief Complaint  Patient presents with   Emesis     Brief Narrative / Interim history: 70 year old M with PMH of stage IV small cell lung cancer on chemo, chemo induced pancytopenia, A. fib not on AC, chronic hep C, chronic cancer pain, HTN, alcohol abuse, malnutrition and polysubstance use presenting with nausea and vomiting and admitted with neutropenic sepsis due to acute cholecystitis and Klebsiella bacteremia.  He had cholecystostomy on 9/11 by IR.   Hospital course complicated by acute thromboembolic CVA with new right-sided weakness for which she was evaluated by neurology and started on aspirin.  Therapy recommended SNF.  See individual problem list below for more hospital course.  Subjective: Seen and examined earlier this morning.  No major events overnight or this morning.  Has no complaints.  He says he just wants to sleep.  He is reluctant to talk much.  He responds no to chest pain, shortness of breath, headache, new focal neuro symptoms, GI or UTI symptoms.  Objective: Vitals:   12/22/20 2109 12/23/20 0507 12/23/20 0800 12/23/20 0912  BP: 139/77 (!) 141/89 136/82 (!) 146/96  Pulse: 98 87 88 (!) 104  Resp: 20 18 18 20   Temp: 98.2 F (36.8 C) 98 F (36.7 C) 98 F (36.7 C) 98.1 F (36.7 C)  TempSrc: Oral Oral Oral Oral  SpO2: 96% 96% 96% 94%  Weight:      Height:        Intake/Output Summary (Last 24 hours) at 12/23/2020 1227 Last data filed at 12/23/2020 0924 Gross per 24 hour  Intake --  Output 1175 ml  Net -1175 ml   Filed Weights   12/17/20 1203 12/18/20 2130  Weight: 61.7 kg 62.2 kg    Examination:  GENERAL: No apparent distress.  Nontoxic. HEENT: MMM.  Vision and hearing grossly intact.  NECK: Supple.  No apparent JVD.  RESP: 96% on RA.  No IWOB.  Fair aeration bilaterally. CVS:  RRR. Heart  sounds normal.  ABD/GI/GU: BS+. Abd soft, NTND.  Percutaneous biliary drain in place. MSK/EXT:  Moves extremities. No apparent deformity. No edema.  SKIN: no apparent skin lesion or wound NEURO: Awake, alert and oriented appropriately.  Left facial droop.  Bilateral upper and lower extremity weakness.  Patellar reflex symmetric. PSYCH: Calm.  Somewhat flat affect.  Procedures:  9/11-percutaneous biliary drain by IR  Microbiology summarized: 9/8-COVID-19 and influenza PCR nonreactive. 9/8-C. difficile negative. 9/8-blood culture with pansensitive Klebsiella pneumonia except to ampicillin 9/8-urine culture NGTD 9/8-MRSA PCR screen positive 9/13-repeat blood cultures NGTD  Assessment & Plan: Neutropenic sepsis-neutropenia resolved. Septicemia secondary to Klebsiella pneumoniae bacteremia. Cholelithiasis with cholecystitis secondary to Klebsiella pneumoniae. -S/p percutaneous biliary drain by IR on 9/11. -TTE negative for vegetation.  -General surgery recommended outpatient follow-up in 6 to 8 weeks and signed off -Continue IV ceftriaxone and IV Flagyl-can be discharged on p.o. cefdinir and p.o. Flagyl    Stage IV small cell lung cancer with osseous mets-s/p 1 cycle of chemo Pancytopenia related to chemotherapy. -Received 3 units of platelets this hospitalization. -H&H stable.  Leukopenia resolved.  Thrombocytopenia improving.   Acute thromboembolic stroke with new right-sided weakness-confirmed on MRI brain on 9/11..   -Cardiology and neurology did not feel TEE is necessary with his thrombocytopenia and risk for aspiration -Neurology recommended low-dose aspirin and outpatient follow-up to decide about  anticoagulation -No indication for statin.  LDL 37.8.  Paroxysmal atrial fibrillation-on Cardizem.  Seems to be on Eliquis previously -Continue p.o. Cardizem -Eliquis on hold due to thrombocytopenia but we might be able to resume since thrombocytopenia improved.   AKI/azotemia:  Resolved. Recent Labs    10/15/20 0928 11/05/20 0820 12/08/20 0818 12/17/20 1205 12/18/20 0500 12/19/20 0457 12/20/20 0500 12/21/20 0434 12/22/20 0513 12/23/20 0305  BUN 16 17 18  31* 48* 43* 36* 30* 21 17  CREATININE 0.88 0.80 0.89 1.45* 1.46* 0.94 0.72 0.57* 0.59* 0.69  -Monitor intermittently  Hypokalemia/hypomagnesemia K3.0.  Mg 1.7. -K-Dur 40 mill equivalent x2 -IV magnesium sulfate 2 g x 1  Hyponatremia: Resolved.   Alcohol use disorder: No withdrawal signs or symptoms.   Goals of care-patient with stage IV small cell lung cancer, acute thromboembolic CVA and other comorbidity as above.  Very poor long-term prognosis.  Still full code.  -Palliative care input appreciated-full code with full scope of care  Severe protein calorie malnutrition/anorexia-significant muscle mass loss.  Likely due to malignancy and chemo. -On Marinol and supplements Body mass index is 18.6 kg/m. Nutrition Problem: Increased nutrient needs Etiology: acute illness, cancer and cancer related treatments, chronic illness Signs/Symptoms: estimated needs Interventions: Prostat, MVI, Ensure Enlive (each supplement provides 350kcal and 20 grams of protein)  Pressure skin injury: Pressure Injury 12/18/20 Heel Right Deep Tissue Pressure Injury - Purple or maroon localized area of discolored intact skin or blood-filled blister due to damage of underlying soft tissue from pressure and/or shear. (Active)  12/18/20 2200  Location: Heel  Location Orientation: Right  Staging: Deep Tissue Pressure Injury - Purple or maroon localized area of discolored intact skin or blood-filled blister due to damage of underlying soft tissue from pressure and/or shear.  Wound Description (Comments):   Present on Admission:      Pressure Injury 12/18/20 Heel Left Deep Tissue Pressure Injury - Purple or maroon localized area of discolored intact skin or blood-filled blister due to damage of underlying soft tissue from  pressure and/or shear. (Active)  12/18/20 2200  Location: Heel  Location Orientation: Left  Staging: Deep Tissue Pressure Injury - Purple or maroon localized area of discolored intact skin or blood-filled blister due to damage of underlying soft tissue from pressure and/or shear.  Wound Description (Comments):   Present on Admission:    DVT prophylaxis:  Place and maintain sequential compression device Start: 12/18/20 1303 Place TED hose Start: 12/17/20 1523  Code Status: Full code Family Communication: Patient and/or RN. Available if any question.  Level of care: Med-Surg Status is: Inpatient  Remains inpatient appropriate because:Persistent severe electrolyte disturbances, Unsafe d/c plan, IV treatments appropriate due to intensity of illness or inability to take PO, and Inpatient level of care appropriate due to severity of illness  Dispo: The patient is from: Home              Anticipated d/c is to: SNF              Patient currently is not medically stable to d/c.   Difficult to place patient No       Consultants:  Neurology Cardiology Oncology Palliative medicine   Sch Meds:  Scheduled Meds:  (feeding supplement) PROSource Plus  30 mL Oral BID BM   sodium chloride   Intravenous Once   sodium chloride   Intravenous Once   aspirin  81 mg Oral Daily   chlorhexidine  10 mL Mouth/Throat BID   Chlorhexidine Gluconate Cloth  6 each Topical Daily   diltiazem  240 mg Oral Daily   dronabinol  2.5 mg Oral BID AC   feeding supplement  1 Container Oral TID BM   feeding supplement  237 mL Oral TID BM   folic acid  1 mg Oral Daily   multivitamin with minerals  1 tablet Oral Daily   mupirocin ointment  1 application Nasal BID   potassium chloride  40 mEq Oral Q4H   sodium chloride flush  10-40 mL Intracatheter Q12H   sodium chloride flush  5 mL Intracatheter Q8H   thiamine  100 mg Oral Daily   Continuous Infusions:  sodium chloride Stopped (12/20/20 1551)   sodium  chloride 10 mL/hr (12/20/20 1220)   0.9 % NaCl with KCl 20 mEq / L 50 mL/hr at 12/21/20 1835   cefTRIAXone (ROCEPHIN)  IV 2 g (12/23/20 0625)   metronidazole 500 mg (12/23/20 0831)   PRN Meds:.sodium chloride, sodium chloride, acetaminophen **OR** acetaminophen, diltiazem, fentaNYL, HYDROmorphone, midazolam, ondansetron **OR** ondansetron (ZOFRAN) IV, oxyCODONE-acetaminophen, sodium chloride flush  Antimicrobials: Anti-infectives (From admission, onward)    Start     Dose/Rate Route Frequency Ordered Stop   12/21/20 1200  metroNIDAZOLE (FLAGYL) IVPB 500 mg        500 mg 100 mL/hr over 60 Minutes Intravenous Every 12 hours 12/21/20 1023     12/19/20 0600  cefTRIAXone (ROCEPHIN) 2 g in sodium chloride 0.9 % 100 mL IVPB        2 g 200 mL/hr over 30 Minutes Intravenous Every 24 hours 12/18/20 0842     12/18/20 1800  vancomycin (VANCOREADY) IVPB 750 mg/150 mL  Status:  Discontinued        750 mg 150 mL/hr over 60 Minutes Intravenous Every 24 hours 12/17/20 1820 12/17/20 1830   12/18/20 1800  vancomycin (VANCOREADY) IVPB 750 mg/150 mL  Status:  Discontinued        750 mg 150 mL/hr over 60 Minutes Intravenous Every 24 hours 12/17/20 1830 12/18/20 0842   12/18/20 0600  ceFEPIme (MAXIPIME) 2 g in sodium chloride 0.9 % 100 mL IVPB  Status:  Discontinued        2 g 200 mL/hr over 30 Minutes Intravenous Every 12 hours 12/17/20 1544 12/17/20 1830   12/18/20 0600  ceFEPIme (MAXIPIME) 2 g in sodium chloride 0.9 % 100 mL IVPB        2 g 200 mL/hr over 30 Minutes Intravenous Every 12 hours 12/17/20 1830 12/18/20 1841   12/17/20 1700  vancomycin (VANCOREADY) IVPB 1500 mg/300 mL        1,500 mg 150 mL/hr over 120 Minutes Intravenous  Once 12/17/20 1544 12/17/20 2050   12/17/20 1600  metroNIDAZOLE (FLAGYL) IVPB 500 mg  Status:  Discontinued        500 mg 100 mL/hr over 60 Minutes Intravenous Every 12 hours 12/17/20 1524 12/18/20 0842   12/17/20 1230  cefTRIAXone (ROCEPHIN) 2 g in sodium chloride 0.9 %  100 mL IVPB  Status:  Discontinued        2 g 200 mL/hr over 30 Minutes Intravenous Every 24 hours 12/17/20 1215 12/17/20 1543        I have personally reviewed the following labs and images: CBC: Recent Labs  Lab 12/17/20 1205 12/17/20 2000 12/18/20 0857 12/19/20 0457 12/20/20 0047 12/20/20 0500 12/21/20 0434 12/22/20 0513 12/23/20 0305  WBC 0.2*   < > 0.4* 0.6*  --  1.7* 5.3 9.7 11.1*  NEUTROABS 0.0*  --  0.1* 0.3*  --  0.7* 3.5  --   --   HGB 10.1*   < > 9.2* 8.9*  --  8.9* 8.8* 9.1* 9.0*  HCT 27.0*   < > 25.6* 23.8*  --  24.0* 24.9* 25.0* 26.0*  MCV 95.4   < > 94.1 94.4  --  94.5 97.6 96.2 96.7  PLT 7*   < > 16* 9* 28* 42* 41* 51* 83*   < > = values in this interval not displayed.   BMP &GFR Recent Labs  Lab 12/19/20 0457 12/20/20 0500 12/21/20 0434 12/22/20 0513 12/23/20 0305  NA 138 137 141 141 141  K 3.3* 3.4* 3.6 3.5 3.0*  CL 110 107 111 110 109  CO2 22 22 22 23 24   GLUCOSE 118* 112* 99 115* 90  BUN 43* 36* 30* 21 17  CREATININE 0.94 0.72 0.57* 0.59* 0.69  CALCIUM 7.0* 7.6* 7.8* 7.9* 7.6*  MG 1.6* 2.1 2.0  --  1.7  PHOS  --   --  1.6*  --   --    Estimated Creatinine Clearance: 76.7 mL/min (by C-G formula based on SCr of 0.69 mg/dL). Liver & Pancreas: Recent Labs  Lab 12/17/20 1205  AST 51*  ALT 32  ALKPHOS 50  BILITOT 1.8*  PROT 6.3*  ALBUMIN 3.1*   No results for input(s): LIPASE, AMYLASE in the last 168 hours. No results for input(s): AMMONIA in the last 168 hours. Diabetic: Recent Labs    12/21/20 0434  HGBA1C 5.4   Recent Labs  Lab 12/18/20 2134 12/21/20 0809 12/21/20 1227  GLUCAP 73 118* 125*   Cardiac Enzymes: No results for input(s): CKTOTAL, CKMB, CKMBINDEX, TROPONINI in the last 168 hours. No results for input(s): PROBNP in the last 8760 hours. Coagulation Profile: Recent Labs  Lab 12/17/20 1205 12/18/20 0500  INR 1.5* 1.6*   Thyroid Function Tests: No results for input(s): TSH, T4TOTAL, FREET4, T3FREE, THYROIDAB  in the last 72 hours. Lipid Profile: Recent Labs    12/21/20 0434  LDLDIRECT 37.8   Anemia Panel: No results for input(s): VITAMINB12, FOLATE, FERRITIN, TIBC, IRON, RETICCTPCT in the last 72 hours. Urine analysis:    Component Value Date/Time   COLORURINE AMBER (A) 12/17/2020 1840   APPEARANCEUR TURBID (A) 12/17/2020 1840   LABSPEC 1.030 12/17/2020 1840   PHURINE 5.0 12/17/2020 1840   GLUCOSEU 100 (A) 12/17/2020 1840   HGBUR LARGE (A) 12/17/2020 1840   BILIRUBINUR MODERATE (A) 12/17/2020 1840   KETONESUR TRACE (A) 12/17/2020 1840   PROTEINUR 100 (A) 12/17/2020 1840   NITRITE POSITIVE (A) 12/17/2020 1840   LEUKOCYTESUR NEGATIVE 12/17/2020 1840   Sepsis Labs: Invalid input(s): PROCALCITONIN, [place]  Microbiology: Recent Results (from the past 240 hour(s))  Blood Culture (routine x 2)     Status: Abnormal   Collection Time: 12/17/20 12:05 PM   Specimen: BLOOD  Result Value Ref Range Status   Specimen Description   Final    BLOOD LEFT CHEST PORT Performed at [facility], [address], [place], [place] [address]    Special Requests   Final    BOTTLES DRAWN AEROBIC AND ANAEROBIC Blood Culture adequate volume Performed at [facility], [address], [place],  [address]    Culture  Setup Time   Final    GRAM NEGATIVE RODS IN BOTH AEROBIC AND ANAEROBIC BOTTLES CRITICAL VALUE NOTED.  VALUE IS CONSISTENT WITH PREVIOUSLY REPORTED AND CALLED VALUE. Performed at [facility], [place],  [place], [place] [address]    Culture (A)  Final    KLEBSIELLA PNEUMONIAE SUSCEPTIBILITIES PERFORMED ON PREVIOUS CULTURE WITHIN THE LAST 5 DAYS. Performed at [facility], [place] [address] [place], [place] [address]    Report Status 12/20/2020 FINAL  Final  Blood Culture (routine x 2)     Status: Abnormal   Collection Time: 12/17/20 12:05 PM   Specimen: BLOOD  Result Value Ref Range Status   Specimen Description   Final    BLOOD LEFT  CHEST Performed at [facility], [address], [place], [place] [address]    Special Requests   Final    BOTTLES DRAWN AEROBIC AND ANAEROBIC Blood Culture adequate volume Performed at [facility], [address], [place], [place] [address]    Culture  Setup Time   Final    GRAM NEGATIVE RODS IN BOTH AEROBIC AND ANAEROBIC BOTTLES CRITICAL RESULT CALLED TO, READ BACK BY AND VERIFIED WITH: Zalma [phone] 12/18/20 HNM Performed at [facility], [place] [address], [place], [place] [address]    Culture KLEBSIELLA PNEUMONIAE (A)  Final   Report Status 12/20/2020 FINAL  Final   Organism ID, Bacteria KLEBSIELLA PNEUMONIAE  Final      Susceptibility   Klebsiella pneumoniae - MIC*    AMPICILLIN >=32 RESISTANT Resistant     CEFAZOLIN <=4 SENSITIVE Sensitive     CEFEPIME <=0.12 SENSITIVE Sensitive     CEFTAZIDIME <=1 SENSITIVE Sensitive     CEFTRIAXONE <=0.25 SENSITIVE Sensitive     CIPROFLOXACIN <=0.25 SENSITIVE Sensitive     GENTAMICIN <=1 SENSITIVE Sensitive     IMIPENEM <=0.25 SENSITIVE Sensitive     TRIMETH/SULFA <=20 SENSITIVE Sensitive     AMPICILLIN/SULBACTAM 4 SENSITIVE Sensitive     PIP/TAZO <=4 SENSITIVE Sensitive     * KLEBSIELLA PNEUMONIAE  Blood Culture ID Panel (Reflexed)     Status: Abnormal   Collection Time: 12/17/20 12:05 PM  Result Value Ref Range Status   Enterococcus faecalis NOT DETECTED NOT DETECTED Final   Enterococcus Faecium NOT DETECTED NOT DETECTED Final   Listeria monocytogenes NOT DETECTED NOT DETECTED Final   Staphylococcus species NOT DETECTED NOT DETECTED Final   Staphylococcus aureus (BCID) NOT DETECTED NOT DETECTED Final   Staphylococcus epidermidis NOT DETECTED NOT DETECTED Final   Staphylococcus lugdunensis NOT DETECTED NOT DETECTED Final   Streptococcus species NOT DETECTED NOT DETECTED Final   Streptococcus agalactiae NOT DETECTED NOT DETECTED Final   Streptococcus pneumoniae NOT DETECTED NOT DETECTED Final    Streptococcus pyogenes NOT DETECTED NOT DETECTED Final   A.calcoaceticus-baumannii NOT DETECTED NOT DETECTED Final   Bacteroides fragilis NOT DETECTED NOT DETECTED Final   Enterobacterales DETECTED (A) NOT DETECTED Final    Comment: Enterobacterales represent a large order of gram negative bacteria, not a single organism. CRITICAL RESULT CALLED TO, READ BACK BY AND VERIFIED WITH: [place]ie RN [phone] 12/18/20 HNM    Enterobacter cloacae complex NOT DETECTED NOT DETECTED Final   Escherichia coli NOT DETECTED NOT DETECTED Final   Klebsiella aerogenes NOT DETECTED NOT DETECTED Final   Klebsiella oxytoca NOT DETECTED NOT DETECTED Final   Klebsiella pneumoniae DETECTED (A) NOT DETECTED Final    Comment: CRITICAL RESULT CALLED TO, READ BACK BY AND VERIFIED WITH: [place] RN [phone] 12/18/20 HNM    Proteus species NOT DETECTED NOT DETECTED Final   Salmonella species NOT DETECTED NOT DETECTED Final   Serratia marcescens NOT DETECTED NOT DETECTED Final   Haemophilus influenzae  NOT DETECTED NOT DETECTED Final   Neisseria meningitidis NOT DETECTED NOT DETECTED Final   Pseudomonas aeruginosa NOT DETECTED NOT DETECTED Final   Stenotrophomonas maltophilia NOT DETECTED NOT DETECTED Final   Candida albicans NOT DETECTED NOT DETECTED Final   Candida auris NOT DETECTED NOT DETECTED Final   Candida glabrata NOT DETECTED NOT DETECTED Final   Candida krusei NOT DETECTED NOT DETECTED Final   Candida parapsilosis NOT DETECTED NOT DETECTED Final   Candida tropicalis NOT DETECTED NOT DETECTED Final   Cryptococcus neoformans/gattii NOT DETECTED NOT DETECTED Final   CTX-M ESBL NOT DETECTED NOT DETECTED Final   Carbapenem resistance IMP NOT DETECTED NOT DETECTED Final   Carbapenem resistance KPC NOT DETECTED NOT DETECTED Final   Carbapenem resistance NDM NOT DETECTED NOT DETECTED Final   Carbapenem resist OXA 48 LIKE NOT DETECTED NOT DETECTED Final   Carbapenem resistance VIM NOT DETECTED NOT DETECTED Final     Comment: Performed at [facility], [address] [place], [place] [address]  Resp Panel by RT-PCR (Flu A&B, Covid) Nasopharyngeal Swab     Status: None   Collection Time: 12/17/20 12:37 PM   Specimen: Nasopharyngeal Swab; Nasopharyngeal(NP) swabs in vial transport medium  Result Value Ref Range Status   SARS Coronavirus 2 by RT PCR NEGATIVE NEGATIVE Final    Comment: (NOTE) SARS-CoV-2 target nucleic acids are NOT DETECTED.  The SARS-CoV-2 RNA is generally detectable in upper respiratory specimens during the acute phase of infection. The lowest concentration of SARS-CoV-2 viral copies this assay can detect is 138 copies/mL. A negative result does not preclude SARS-Cov-2 infection and should not be used as the sole basis for treatment or other patient management decisions. A negative result may occur with  improper specimen collection/handling, submission of specimen other than nasopharyngeal swab, presence of viral mutation(s) within the areas targeted by this assay, and inadequate number of viral copies(<138 copies/mL). A negative result must be combined with clinical observations, patient history, and epidemiological information. The expected result is Negative.  Fact Sheet for Patients:  EntrepreneurPulse.com.au  Fact Sheet for Healthcare Providers:  IncredibleEmployment.be  This test is no t yet approved or cleared by the [place] FDA and  has been authorized for detection and/or diagnosis of SARS-CoV-2 by FDA under an Emergency Use Authorization (EUA). This EUA will remain  in effect (meaning this test can be used) for the duration of the COVID-19 declaration under Section 564(b)(1) of the Act, 21 U.S.C.section 360bbb-3(b)(1), unless the authorization is terminated  or revoked sooner.       Influenza A by PCR NEGATIVE NEGATIVE Final   Influenza B by PCR NEGATIVE NEGATIVE Final    Comment: (NOTE) The Xpert Xpress  SARS-CoV-2/FLU/RSV plus assay is intended as an aid in the diagnosis of influenza from Nasopharyngeal swab specimens and should not be used as a sole basis for treatment. Nasal washings and aspirates are unacceptable for Xpert Xpress SARS-CoV-2/FLU/RSV testing.  Fact Sheet for Patients: EntrepreneurPulse.com.au  Fact Sheet for Healthcare Providers: IncredibleEmployment.be  This test is not yet approved or cleared by the [place] FDA and has been authorized for detection and/or diagnosis of SARS-CoV-2 by FDA under an Emergency Use Authorization (EUA). This EUA will remain in effect (meaning this test can be used) for the duration of the COVID-19 declaration under Section 564(b)(1) of the Act, 21 U.S.C. section 360bbb-3(b)(1), unless the authorization is terminated or revoked.  Performed at [facility], [address], [place], [place] [address]   Urine Culture  Status: None   Collection Time: 12/17/20  3:32 PM   Specimen: In/Out Cath Urine  Result Value Ref Range Status   Specimen Description   Final    IN/OUT CATH URINE Performed at [facility], [address], [place], [place] [address]    Special Requests   Final    NONE Performed at [facility], [address], [place], [place] [address]    Culture   Final    NO GROWTH Performed at [facility], [place] [address], [place], [place] [address]    Report Status 12/19/2020 FINAL  Final  C Difficile Quick Screen w PCR reflex     Status: None   Collection Time: 12/17/20  3:32 PM   Specimen: STOOL  Result Value Ref Range Status   C Diff antigen NEGATIVE NEGATIVE Final   C Diff toxin NEGATIVE NEGATIVE Final   C Diff interpretation No C. difficile detected.  Final    Comment: Performed at [facility], [address] [place], [place] [address]  MRSA Next Gen by PCR, Nasal     Status: Abnormal   Collection Time: 12/18/20  9:55 PM    Specimen: Nasal Mucosa; Nasal Swab  Result Value Ref Range Status   MRSA by PCR Next Gen DETECTED (A) NOT DETECTED Final    Comment: RESULT CALLED TO, READ BACK BY AND VERIFIED WITH: BETH BUONO @ 2327 12/18/20 LFD (NOTE) The GeneXpert MRSA Assay (FDA approved for NASAL specimens only), is one component of a comprehensive MRSA colonization surveillance program. It is not intended to diagnose MRSA infection nor to guide or monitor treatment for MRSA infections. Test performance is not FDA approved in patients less than 44 years old. Performed at [facility], [address] [place], [place] [address]   CULTURE, BLOOD (ROUTINE X 2) w Reflex to ID Panel     Status: None (Preliminary result)   Collection Time: 12/22/20  2:14 PM   Specimen: BLOOD  Result Value Ref Range Status   Specimen Description BLOOD LEFT ANTECUBITAL  Final   Special Requests   Final    BOTTLES DRAWN AEROBIC AND ANAEROBIC Blood Culture adequate volume   Culture   Final    NO GROWTH < 24 HOURS Performed at [facility], [address] [place], [place] [address]    Report Status PENDING  Incomplete  CULTURE, BLOOD (ROUTINE X 2) w Reflex to ID Panel     Status: None (Preliminary result)   Collection Time: 12/22/20  2:15 PM   Specimen: BLOOD  Result Value Ref Range Status   Specimen Description BLOOD RIGHT ANTECUBITAL  Final   Special Requests   Final    BOTTLES DRAWN AEROBIC AND ANAEROBIC Blood Culture adequate volume   Culture   Final    NO GROWTH < 24 HOURS Performed at [facility], [address], [place], [place] [address]    Report Status PENDING  Incomplete    Radiology Studies: No results found.    Kayleann Mccaffery T. [place]  If 7PM-7AM, please contact night-coverage [email or web address] 12/23/2020, 12:27 PM

## 2020-12-23 NOTE — NC FL2 (Signed)
[place] LEVEL OF CARE SCREENING TOOL     IDENTIFICATION  Patient Name: Scott Jordan Birthdate: [birth year] Sex: male Admission Date (Current Location): 12/17/2020  [facility] and [place] Number:  Engineering geologist and Address:  [facility], [address], [place], [place] [address]      Provider Number: [id number]  Attending Physician Name and Address:  Mercy Riding, MD  Relative Name and Phone Number:       Current Level of Care: Hospital Recommended Level of Care: [place] Prior Approval Number:    Date Approved/Denied:   PASRR Number: [id number]  Discharge Plan: SNF    Current Diagnoses: Patient Active Problem List   Diagnosis Date Noted   Cerebrovascular accident (CVA) due to embolism of precerebral artery ([place])    Palliative care encounter    Cholelithiasis with acute cholecystitis 12/20/2020   Cholecystitis    Septicemia due to Klebsiella pneumoniae ([place]) 12/18/2020   Neutropenic sepsis ([place]) 12/17/2020   Head trauma 06/24/2020   Ulnar neuropathy of left upper extremity 05/13/2020   Cervical radiculopathy 05/13/2020   B12 deficiency 04/06/2020   Weight loss 03/23/2020   Neuropathy of left upper extremity 03/22/2020   Hepatitis B infection without delta agent without hepatic coma 12/30/2019   Chronic hepatitis C without hepatic coma ([place]) 12/30/2019   Chronic anticoagulation 12/23/2019   Elevated LFTs 12/23/2019   Hypokalemia 12/08/2019   Acute respiratory failure with hypoxia ([place]) 11/23/2019   Heroin overdose, accidental or unintentional, initial encounter ([place]) 11/23/2019   Aspiration pneumonia ([place]) 11/23/2019   Pancytopenia ([place]) 11/23/2019   Atypical atrial flutter ([place]) 11/23/2019   Hypomagnesemia 10/21/2019   Antineoplastic chemotherapy induced anemia 10/13/2019   Dehydration 10/13/2019   Thrombocytopenia ([place]) 10/13/2019   Chemotherapy-induced neutropenia ([place]) 10/10/2019   Anemia  09/11/2019   Cancer-related pain 09/05/2019   Encounter for antineoplastic chemotherapy 09/02/2019   Encounter for antineoplastic immunotherapy 09/02/2019   Bone metastasis ([place]) 09/02/2019   Goals of care, counseling/discussion 08/26/2019   Small cell lung cancer ([place]) 08/23/2019   Protein-calorie malnutrition, severe 08/19/2019   Hyponatremia    Dyspnea    Pleural effusion    Recurrent pleural effusion on left    Malignant pleural effusion 08/17/2019   Alcohol abuse 07/21/2019   History of substance abuse ([place]) 06/18/2019   Hypertension, essential 06/18/2019   Malnutrition of moderate degree 01/15/2019   Elevated troponin 01/14/2019    Orientation RESPIRATION BLADDER Height & Weight     Self, Time, Situation, Place  Normal Continent Weight: 137 lb 2 oz (62.2 kg) Height:  6' (182.9 cm)  BEHAVIORAL SYMPTOMS/MOOD NEUROLOGICAL BOWEL NUTRITION STATUS      Incontinent Diet (Heart healthy diet, Nectar Thick; Fluid restriction: 2000 mL Fluid)  AMBULATORY STATUS COMMUNICATION OF NEEDS Skin   Extensive Assist Verbally PU Stage and Appropriate Care (Right Heel, deep tissue injury. Left Hill, deep tissue injury)                       Personal Care Assistance Level of Assistance  Dressing, Feeding, Bathing Bathing Assistance: Maximum assistance Feeding assistance: Independent Dressing Assistance: Maximum assistance     Functional Limitations Info  Sight, Hearing, Speech Sight Info: Adequate Hearing Info: Adequate Speech Info: Adequate    SPECIAL CARE FACTORS FREQUENCY  PT (By licensed PT), OT (By licensed OT)     PT Frequency: 5x OT Frequency: 5x  Contractures Contractures Info: Not present    Additional Factors Info  Code Status, Allergies Code Status Info: DNR Allergies Info: NO known allergies           Current Medications (12/23/2020):  This is the current hospital active medication list Current Facility-Administered Medications  Medication  Dose Route Frequency Provider Last Rate Last Admin   (feeding supplement) PROSource Plus liquid 30 mL  30 mL Oral BID BM Sharen Hones, MD   30 mL at 12/21/20 1056   0.9 %  sodium chloride infusion (Manually program via Guardrails IV Fluids)   Intravenous Once Sharen Hones, MD       0.9 %  sodium chloride infusion (Manually program via Guardrails IV Fluids)   Intravenous Once Sharen Hones, MD       0.9 %  sodium chloride infusion   Intravenous PRN Sharen Hones, MD   Stopped at 12/20/20 1551   0.9 %  sodium chloride infusion    Continuous PRN Criselda Peaches, MD 10 mL/hr at 12/20/20 1220 10 mL/hr at 12/20/20 1220   0.9 % NaCl with KCl 20 mEq/ L  infusion   Intravenous Continuous Sharen Hones, MD 50 mL/hr at 12/21/20 1835 Infusion Verify at 12/21/20 1835   acetaminophen (TYLENOL) tablet 650 mg  650 mg Oral Q6H PRN Cox, Amy N, DO       Or   acetaminophen (TYLENOL) suppository 650 mg  650 mg Rectal Q6H PRN Cox, Amy N, DO       aspirin chewable tablet 81 mg  81 mg Oral Daily Derek Jack, MD   81 mg at 12/23/20 0836   cefTRIAXone (ROCEPHIN) 2 g in sodium chloride 0.9 % 100 mL IVPB  2 g Intravenous Q24H Sharen Hones, MD 200 mL/hr at 12/23/20 0625 2 g at 12/23/20 0625   chlorhexidine (PERIDEX) 0.12 % solution 10 mL  10 mL Mouth/Throat BID Earlie Server, MD   10 mL at 12/23/20 2595   Chlorhexidine Gluconate Cloth 2 % PADS 6 each  6 each Topical Daily Cox, Amy N, DO   6 each at 12/23/20 1036   diltiazem (CARDIZEM CD) 24 hr capsule 240 mg  240 mg Oral Daily Cox, Amy N, DO   240 mg at 12/23/20 6387   diltiazem (CARDIZEM) injection 10 mg  10 mg Intravenous Q2H PRN Cox, Amy N, DO       dronabinol (MARINOL) capsule 2.5 mg  2.5 mg Oral BID AC Sharen Hones, MD   2.5 mg at 12/23/20 1252   feeding supplement (BOOST / RESOURCE BREEZE) liquid 1 Container  1 Container Oral TID BM Sharen Hones, MD   1 Container at 12/23/20 0840   feeding supplement (ENSURE ENLIVE / ENSURE PLUS) liquid 237 mL  237 mL Oral TID BM Cox,  Amy N, DO   237 mL at 12/23/20 0839   fentaNYL (SUBLIMAZE) injection    PRN Criselda Peaches, MD   50 mcg at 56/43/32 9518   folic acid (FOLVITE) tablet 1 mg  1 mg Oral Daily Sharen Hones, MD   1 mg at 12/23/20 0836   HYDROmorphone (DILAUDID) injection    PRN Criselda Peaches, MD   0.5 mg at 12/20/20 1259   metroNIDAZOLE (FLAGYL) IVPB 500 mg  500 mg Intravenous Q12H Max Sane, MD 100 mL/hr at 12/23/20 0831 500 mg at 12/23/20 0831   midazolam (VERSED) injection    PRN Criselda Peaches, MD   1 mg at 12/20/20 1250   multivitamin  with minerals tablet 1 tablet  1 tablet Oral Daily Sharen Hones, MD   1 tablet at 12/23/20 0836   mupirocin ointment (BACTROBAN) 2 % 1 application  1 application Nasal BID Cox, Amy N, DO   1 application at 60/63/01 0841   ondansetron (ZOFRAN) tablet 4 mg  4 mg Oral Q6H PRN Cox, Amy N, DO       Or   ondansetron (ZOFRAN) injection 4 mg  4 mg Intravenous Q6H PRN Cox, Amy N, DO       oxyCODONE-acetaminophen (PERCOCET/ROXICET) 5-325 MG per tablet 1 tablet  1 tablet Oral Q6H PRN Mansy, Jan A, MD   1 tablet at 12/22/20 2121   sodium chloride flush (NS) 0.9 % injection 10-40 mL  10-40 mL Intracatheter Q12H Shah, Vipul, MD   10 mL at 12/23/20 0840   sodium chloride flush (NS) 0.9 % injection 10-40 mL  10-40 mL Intracatheter PRN Max Sane, MD       sodium chloride flush (NS) 0.9 % injection 5 mL  5 mL Intracatheter Q8H Criselda Peaches, MD   5 mL at 12/23/20 6010   thiamine tablet 100 mg  100 mg Oral Daily Sharen Hones, MD   100 mg at 12/23/20 9323   Facility-Administered Medications Ordered in Other Encounters  Medication Dose Route Frequency Provider Last Rate Last Admin   heparin lock flush 100 unit/mL  500 Units Intravenous Once Corcoran, Melissa C, MD       sodium chloride flush (NS) 0.9 % injection 10 mL  10 mL Intravenous PRN Lequita Asal, MD   10 mL at 11/11/19 5573     Discharge Medications: Please see discharge summary for a list of discharge  medications.  Relevant Imaging Results:  Relevant Lab Results:   Additional Information [id number]  Gerrianne Scale Shinika Estelle, LCSW

## 2020-12-23 NOTE — Progress Notes (Signed)
Hematology/Oncology Progress Note [facility] Telephone:([phone] [phone]  Patient Care Team: Earlie Server, MD as PCP - General (Oncology) Telford Nab, RN as Registered Nurse Erby Pian, MD as Referring Physician (Specialist) Teodoro Spray, MD as Consulting Physician (Cardiology) Anthonette Legato, MD as Consulting Physician (Nephrology) Vladimir Crofts, MD as Consulting Physician (Neurology) Meade Maw, MD as Consulting Physician (Neurosurgery)   Name of the patient: Scott Jordan  [id number]  [birth year]  Date of visit: 12/23/20   INTERVAL HISTORY-  S/p cholecystostomy New onset of right upper extremity weakness 12/20/2020 MRI brain w wo contrast - multiple infarcts.  12/21/2020 CT angiogram head and neck showed advanced chronic microvascular ischemic changes of the white matter.  Atherosclerosis disease of the bilateral carotid bifurcation, bilateral vertebral arteries without hemodynamically significant stenosis.  Mild intracranial disease without stenosis. 12/21/2020 echocardiogram showed LVEF 60-65%. No vegetation. 12/21/20 I Have discussed and update patient's power of attorney Scott Jordan over the phone. Today, patient reports feeling tired and fatigued. Mild abdomen pain.    No Known Allergies  Patient Active Problem List   Diagnosis Date Noted   Cerebrovascular accident (CVA) due to embolism of precerebral artery ([place])    Palliative care encounter    Cholelithiasis with acute cholecystitis 12/20/2020   Cholecystitis    Septicemia due to Klebsiella pneumoniae ([place]) 12/18/2020   Neutropenic sepsis ([place]) 12/17/2020   Head trauma 06/24/2020   Ulnar neuropathy of left upper extremity 05/13/2020   Cervical radiculopathy 05/13/2020   B12 deficiency 04/06/2020   Weight loss 03/23/2020   Neuropathy of left upper extremity 03/22/2020   Hepatitis B infection without delta agent without hepatic coma 12/30/2019   Chronic hepatitis  C without hepatic coma ([place]) 12/30/2019   Chronic anticoagulation 12/23/2019   Elevated LFTs 12/23/2019   Hypokalemia 12/08/2019   Acute respiratory failure with hypoxia ([place]) 11/23/2019   Heroin overdose, accidental or unintentional, initial encounter ([place]) 11/23/2019   Aspiration pneumonia ([place]) 11/23/2019   Pancytopenia ([place]) 11/23/2019   Atypical atrial flutter ([place]) 11/23/2019   Hypomagnesemia 10/21/2019   Antineoplastic chemotherapy induced anemia 10/13/2019   Dehydration 10/13/2019   Thrombocytopenia ([place]) 10/13/2019   Chemotherapy-induced neutropenia (H[facility]) 10/10/2019   Anemia 09/11/2019   Cancer-related pain 09/05/2019   Encounter for antineoplastic chemotherapy 09/02/2019   Encounter for antineoplastic immunotherapy 09/02/2019   Bone metastasis ([place]) 09/02/2019   Goals of care, counseling/discussion 08/26/2019   Small cell lung cancer ([place]) 08/23/2019   Protein-calorie malnutrition, severe 08/19/2019   Hyponatremia    Dyspnea    Pleural effusion    Recurrent pleural effusion on left    Malignant pleural effusion 08/17/2019   Alcohol abuse 07/21/2019   History of substance abuse ([place]) 06/18/2019   Hypertension, essential 06/18/2019   Malnutrition of moderate degree 01/15/2019   Elevated troponin 01/14/2019     Past Medical History:  Diagnosis Date   A-fib ([place]) 01/10/2019   pt st this was dx by Dr. Ubaldo Glassing   Cancer [facility]) 0240   LUNG   Complication of anesthesia    DIFFICULTY WAKING UP AFTER SURGERY- 82 YRS AGO   Hypertension    Lung cancer ([place])    Substance abuse ([place])      Past Surgical History:  Procedure Laterality Date   ARM DEBRIDEMENT Left    INCISION AND DEBRIDEMENT LOWER ARM -20 YRS AGO   BACK SURGERY     IR PERC CHOLECYSTOSTOMY  12/20/2020   PORTACATH PLACEMENT Left 08/29/2019   Procedure: INSERTION PORT-A-CATH;  Surgeon: Nestor Lewandowsky, MD;  Location: [facility] ORS;  Service: General;  Laterality: Left;    Social History   Socioeconomic History    Marital status: Single    Spouse name: Not on file   Number of children: Not on file   Years of education: Not on file   Highest education level: Not on file  Occupational History   Not on file  Tobacco Use   Smoking status: Former    Packs/day: 0.50    Types: Cigarettes   Smokeless tobacco: Never   Tobacco comments:    not smoked since 6 weeks ago  Vaping Use   Vaping Use: Never used  Substance and Sexual Activity   Alcohol use: Yes    Comment: 57 OZ IN WEEK   Drug use: Not Currently    Types: Heroin    Comment: heroin WITHIN PAST YEAR   Sexual activity: Not on file  Other Topics Concern   Not on file  Social History Narrative   Not on file   Social Determinants of Health   Financial Resource Strain: Not on file  Food Insecurity: Not on file  Transportation Needs: Not on file  Physical Activity: Not on file  Stress: Not on file  Social Connections: Not on file  Intimate Partner Violence: Not on file     History reviewed. No pertinent family history.   Current Facility-Administered Medications:    (feeding supplement) PROSource Plus liquid 30 mL, 30 mL, Oral, BID BM, Sharen Hones, MD, 30 mL at 12/21/20 1056   0.9 %  sodium chloride infusion (Manually program via Guardrails IV Fluids), , Intravenous, Once, Sharen Hones, MD   0.9 %  sodium chloride infusion (Manually program via Guardrails IV Fluids), , Intravenous, Once, Sharen Hones, MD   0.9 %  sodium chloride infusion, , Intravenous, PRN, Sharen Hones, MD, Stopped at 12/20/20 1551   0.9 %  sodium chloride infusion, , , Continuous PRN, Criselda Peaches, MD, Last Rate: 10 mL/hr at 12/20/20 1220, 10 mL/hr at 12/20/20 1220   0.9 % NaCl with KCl 20 mEq/ L  infusion, , Intravenous, Continuous, Sharen Hones, MD, Last Rate: 50 mL/hr at 12/21/20 1835, Infusion Verify at 12/21/20 1835   acetaminophen (TYLENOL) tablet 650 mg, 650 mg, Oral, Q6H PRN **OR** acetaminophen (TYLENOL) suppository 650 mg, 650 mg, Rectal, Q6H PRN,  Cox, Amy N, DO   aspirin chewable tablet 81 mg, 81 mg, Oral, Daily, Derek Jack, MD, 81 mg at 12/23/20 0836   cefTRIAXone (ROCEPHIN) 2 g in sodium chloride 0.9 % 100 mL IVPB, 2 g, Intravenous, Q24H, Sharen Hones, MD, Last Rate: 200 mL/hr at 12/23/20 0625, 2 g at 12/23/20 0625   chlorhexidine (PERIDEX) 0.12 % solution 10 mL, 10 mL, Mouth/Throat, BID, Earlie Server, MD, 10 mL at 12/23/20 1740   Chlorhexidine Gluconate Cloth 2 % PADS 6 each, 6 each, Topical, Daily, Cox, Amy N, DO, 6 each at 12/23/20 1036   diltiazem (CARDIZEM CD) 24 hr capsule 240 mg, 240 mg, Oral, Daily, Cox, Amy N, DO, 240 mg at 12/23/20 8144   diltiazem (CARDIZEM) injection 10 mg, 10 mg, Intravenous, Q2H PRN, Cox, Amy N, DO   dronabinol (MARINOL) capsule 2.5 mg, 2.5 mg, Oral, BID AC, Sharen Hones, MD, 2.5 mg at 12/23/20 1626   feeding supplement (BOOST / RESOURCE BREEZE) liquid 1 Container, 1 Container, Oral, TID BM, Sharen Hones, MD, 1 Container at 12/23/20 0840   feeding supplement (ENSURE ENLIVE / ENSURE PLUS) liquid 237 mL,  237 mL, Oral, TID BM, Cox, Amy N, DO, 237 mL at 12/23/20 0839   fentaNYL (SUBLIMAZE) injection, , , PRN, Criselda Peaches, MD, 50 mcg at 17/00/17 4944   folic acid (FOLVITE) tablet 1 mg, 1 mg, Oral, Daily, Sharen Hones, MD, 1 mg at 12/23/20 0836   HYDROmorphone (DILAUDID) injection, , , PRN, Criselda Peaches, MD, 0.5 mg at 12/20/20 1259   metroNIDAZOLE (FLAGYL) IVPB 500 mg, 500 mg, Intravenous, Q12H, Max Sane, MD, Last Rate: 100 mL/hr at 12/23/20 0831, 500 mg at 12/23/20 0831   midazolam (VERSED) injection, , , PRN, Criselda Peaches, MD, 1 mg at 12/20/20 1250   multivitamin with minerals tablet 1 tablet, 1 tablet, Oral, Daily, Sharen Hones, MD, 1 tablet at 12/23/20 0836   mupirocin ointment (BACTROBAN) 2 % 1 application, 1 application, Nasal, BID, Cox, Amy N, DO, 1 application at 96/75/91 0841   ondansetron (ZOFRAN) tablet 4 mg, 4 mg, Oral, Q6H PRN **OR** ondansetron (ZOFRAN) injection 4 mg, 4 mg,  Intravenous, Q6H PRN, Cox, Amy N, DO   oxyCODONE-acetaminophen (PERCOCET/ROXICET) 5-325 MG per tablet 1 tablet, 1 tablet, Oral, Q6H PRN, Mansy, Jan A, MD, 1 tablet at 12/23/20 2040   sodium chloride flush (NS) 0.9 % injection 10-40 mL, 10-40 mL, Intracatheter, Q12H, Manuella Ghazi, Vipul, MD, 10 mL at 12/23/20 0840   sodium chloride flush (NS) 0.9 % injection 10-40 mL, 10-40 mL, Intracatheter, PRN, Manuella Ghazi, Vipul, MD   sodium chloride flush (NS) 0.9 % injection 5 mL, 5 mL, Intracatheter, Q8H, Jacqulynn Cadet K, MD, 5 mL at 12/23/20 1626   thiamine tablet 100 mg, 100 mg, Oral, Daily, Sharen Hones, MD, 100 mg at 12/23/20 6384  Facility-Administered Medications Ordered in Other Encounters:    heparin lock flush 100 unit/mL, 500 Units, Intravenous, Once, Corcoran, Melissa C, MD   sodium chloride flush (NS) 0.9 % injection 10 mL, 10 mL, Intravenous, PRN, Nolon Stalls C, MD, 10 mL at 11/11/19 0812   Physical exam:  Vitals:   12/23/20 0507 12/23/20 0800 12/23/20 0912 12/23/20 1506  BP: (!) 141/89 136/82 (!) 146/96 121/87  Pulse: 87 88 (!) 104 91  Resp: 18 18 20 20   Temp: 98 F (36.7 C) 98 F (36.7 C) 98.1 F (36.7 C) 98.4 F (36.9 C)  TempSrc: Oral Oral Oral Oral  SpO2: 96% 96% 94% 94%  Weight:      Height:       Physical Exam Constitutional:      General: He is not in acute distress.    Appearance: He is not diaphoretic.  HENT:     Head: Normocephalic and atraumatic.     Nose: Nose normal.     Mouth/Throat:     Pharynx: No oropharyngeal exudate.  Eyes:     General: No scleral icterus.    Pupils: Pupils are equal, round, and reactive to light.  Cardiovascular:     Rate and Rhythm: Normal rate and regular rhythm.     Heart sounds: No murmur heard. Pulmonary:     Effort: Pulmonary effort is normal. No respiratory distress.     Breath sounds: No rales.  Chest:     Chest wall: No tenderness.  Abdominal:     General: There is no distension.     Palpations: Abdomen is soft.      Tenderness: There is no abdominal tenderness.     Comments: cholecystostomy tube  Musculoskeletal:        General: Normal range of motion.  Cervical back: Normal range of motion and neck supple.  Skin:    General: Skin is warm and dry.     Findings: No erythema.  Neurological:     Mental Status: He is alert. Mental status is at baseline.     Cranial Nerves: No cranial nerve deficit.     Motor: No abnormal muscle tone.     Coordination: Coordination normal.  Psychiatric:        Mood and Affect: Affect normal.       CMP Latest Ref Rng & Units 12/23/2020  Glucose 70 - 99 mg/dL 90  BUN 8 - 23 mg/dL 17  Creatinine 0.61 - 1.24 mg/dL 0.69  Sodium 135 - 145 mmol/L 141  Potassium 3.5 - 5.1 mmol/L 3.0(L)  Chloride 98 - 111 mmol/L 109  CO2 22 - 32 mmol/L 24  Calcium 8.9 - 10.3 mg/dL 7.6(L)  Total Protein 6.5 - 8.1 g/dL -  Total Bilirubin 0.3 - 1.2 mg/dL -  Alkaline Phos 38 - 126 U/L -  AST 15 - 41 U/L -  ALT 0 - 44 U/L -   CBC Latest Ref Rng & Units 12/23/2020  WBC 4.0 - 10.5 K/uL 11.1(H)  Hemoglobin 13.0 - 17.0 g/dL 9.0(L)  Hematocrit 39.0 - 52.0 % 26.0(L)  Platelets 150 - 400 K/uL 83(L)    RADIOGRAPHIC STUDIES: I have personally reviewed the radiological images as listed and agreed with the findings in the report. CT ABDOMEN PELVIS WO CONTRAST  Result Date: 12/17/2020 CLINICAL DATA:  Nausea and vomiting EXAM: CT ABDOMEN AND PELVIS WITHOUT CONTRAST TECHNIQUE: Multidetector CT imaging of the abdomen and pelvis was performed following the standard protocol without IV contrast. COMPARISON:  CT 06/23/2020, PET CT 11/19/2020 FINDINGS: Lower chest: Lung bases demonstrate emphysema. Subpleural bandlike airspace disease at the left lower lobe redemonstrated. Incompletely visualized subpleural peripheral left upper lobe nodule measuring 11 mm, series 5, image 1. Cardiomegaly with trace pericardial effusion. Coronary vascular calcification. Hepatobiliary: Extensive motion degradation. No  focal hepatic abnormality. Gallstones. Gallbladder slightly distended. Possible wall thickening or pericholecystic fluid though limited by motion Pancreas: Unremarkable. No pancreatic ductal dilatation or surrounding inflammatory changes. Spleen: Normal in size without focal abnormality. Adrenals/Urinary Tract: Thickened appearance of adrenal glands. No hydronephrosis. The bladder is unremarkable Stomach/Bowel: Stomach nonenlarged. No dilated small bowel. Negative appendix. No acute bowel wall thickening Vascular/Lymphatic: Advanced aortic atherosclerosis. No aneurysm. Slightly increased multiple small retroperitoneal lymph nodes. Reproductive: Prostate is unremarkable. Other: Negative for free air or free fluid Musculoskeletal: No acute osseous abnormality. IMPRESSION: 1. Degraded by motion. 2. Gallbladder appears slightly distended and there are gallstones. Possible gallbladder wall thickening or pericholecystic fluid though limited by motion, consider correlation with ultrasound 3. Cardiomegaly with trace pericardial effusion. 4. Similar subpleural bandlike density at left lower lobe. Incompletely visualized peripheral left upper lobe lung nodule characterized on recent PET CT Electronically Signed   By: Donavan Foil M.D.   On: 12/17/2020 16:50   CT ANGIO HEAD NECK W WO CM  Result Date: 12/21/2020 CLINICAL DATA:  Neuro deficit, acute, stroke suspected. Neutropenic sepsis. EXAM: CT ANGIOGRAPHY HEAD AND NECK TECHNIQUE: Multidetector CT imaging of the head and neck was performed using the standard protocol during bolus administration of intravenous contrast. Multiplanar CT image reconstructions and MIPs were obtained to evaluate the vascular anatomy. Carotid stenosis measurements (when applicable) are obtained utilizing NASCET criteria, using the distal internal carotid diameter as the denominator. CONTRAST:  52mL OMNIPAQUE IOHEXOL 350 MG/ML SOLN COMPARISON:  MRI of the brain  December 20, 2020. FINDINGS: CT  HEAD FINDINGS Brain: Confluent hypodensity of the white matter of the cerebral hemispheres, nonspecific, most likely related to chronic small vessel ischemia. Foci of acute infarct are more conspicuous on recent MRI of the brain. No new large territorial infarct. No hemorrhage, hydrocephalus, mass or midline shift. Vascular: No hyperdense vessel or unexpected calcification. Skull: Normal. Negative for fracture or focal lesion. Sinuses: Increased thickness of the walls of the bilateral maxillary sinuses with bubbly secretion on the left. Findings are suggestive of chronic sinusitis. Right mastoid effusion. Orbits: No acute finding. Review of the MIP images confirms the above findings CTA NECK FINDINGS Aortic arch: Standard branching. Imaged portion shows no evidence of aneurysm or dissection. Mild atherosclerotic changes of the aortic arch without significant stenosis of the major arch vessel origins. Right carotid system: Calcified atherosclerotic disease of the right carotid bifurcation without hemodynamically significant stenosis. Left carotid system: Atherosclerotic disease of the left carotid bifurcation with mixed density plaques without hemodynamically significant stenosis. Vertebral arteries: Calcified atherosclerotic plaque at the origin of the right vertebral artery without hemodynamically significant stenosis. Mild atherosclerotic changes at the origin of the left vertebral artery without hemodynamically significant stenosis. Cervical vertebral arteries otherwise have normal caliber Skeleton: Degenerative changes of the cervical spine with fusion of the facet joints at C2-3 on the left. No acute findings. Other neck: Negative. Upper chest: Left-sided pleural effusion and atelectasis. Mediastinal lymphadenopathy. Review of the MIP images confirms the above findings CTA HEAD FINDINGS Anterior circulation: Calcified atherosclerotic plaques in the bilateral carotid siphons. No significant stenosis, proximal  occlusion, aneurysm, or vascular malformation. Posterior circulation: No significant stenosis, proximal occlusion, aneurysm, or vascular malformation. Hypoplastic/aplastic right P1/PCA segment with right fetal PCA. Venous sinuses: As permitted by contrast timing, patent. Anatomic variants: Right fetal PCA. Review of the MIP images confirms the above findings IMPRESSION: 1. Advanced chronic microvascular ischemic changes of the white matter. 2. Atherosclerotic disease of the bilateral carotid bifurcation without hemodynamically significant stenosis. 3. Mild atherosclerotic changes at the origin of the bilateral vertebral arteries without hemodynamically significant stenosis. 4. Mild intracranial atherosclerotic disease without hemodynamically significant stenosis. Electronically Signed   By: Pedro Earls M.D.   On: 12/21/2020 15:32   CT HEAD WO CONTRAST (5MM)  Result Date: 12/17/2020 CLINICAL DATA:  Nausea vomiting EXAM: CT HEAD WITHOUT CONTRAST TECHNIQUE: Contiguous axial images were obtained from the base of the skull through the vertex without intravenous contrast. COMPARISON:  CT brain 06/24/2020, MRI 03/10/2020, head CT 11/22/2019 FINDINGS: Brain: No acute territorial infarction, hemorrhage or intracranial mass. Extensive white matter disease, progressed from more remote exams from 2021. Stable ventricle size. Chronic lacunar infarcts within the left white matter. Vascular: No hyperdense vessel.  Carotid vascular calcification Skull: Normal. Negative for fracture or focal lesion. Sinuses/Orbits: Mucosal thickening the sinuses. Chronic appearing nasal deformity Other: None IMPRESSION: 1. No definite CT evidence for acute intracranial abnormality. 2. Atrophy. Extensive white matter disease, grossly stable as compared with recent head CT from March, appears progressive when compared to exams from 2021 and prior Electronically Signed   By: Donavan Foil M.D.   On: 12/17/2020 16:39   MR BRAIN W  WO CONTRAST  Addendum Date: 12/21/2020   ADDENDUM REPORT: 12/21/2020 14:49 ADDENDUM: Patient returns for repeat postcontrast imaging. There is no abnormal enhancement. No evidence of intracranial metastatic disease. Electronically Signed   By: Macy Mis M.D.   On: 12/21/2020 14:49   Result Date: 12/21/2020 CLINICAL DATA:  Neuro deficit, acute,  stroke suspected Small cell lung cancer, monitor EXAM: MRI HEAD WITHOUT AND WITH CONTRAST TECHNIQUE: Multiplanar, multiecho pulse sequences of the brain and surrounding structures were obtained without and with intravenous contrast. CONTRAST:  7.6mL GADAVIST GADOBUTROL 1 MMOL/ML IV SOLN COMPARISON:  CT head 12/17/2020. FINDINGS: Mildly motion limited exam.  Within this limitation: Brain: Multiple small areas of restricted diffusion in the bilateral frontal and occipital, and left parietal matter, left perirolandic cortex, left thalamocapsular region, and left cerebellum. Advanced confluent T2 hyperintensity within the white matter, nonspecific but compatible with chronic microvascular ischemic disease and progressed since 2021. Small remote infarct in the left corona radiata. No hydrocephalus, obvious mass lesion, midline shift, acute hemorrhage, or extra-axial fluid collection. Punctate foci of susceptibility artifact in the left cerebellum and left frontal white matter, nonspecific but potentially related to prior microhemorrhage. Contrast was administered; however, there is no evidence of enhancement (for example, the nasal mucosa/turbinates are nonenhancing). This precludes evaluation for enhancing lesions. Vascular: Major arterial flow voids are maintained at the skull base. Skull and upper cervical spine: Normal marrow signal. Sinuses/Orbits: Mild paranasal sinus disease. Other: Moderate right and small left mastoid effusions. IMPRESSION: 1. Multiple small areas of restricted diffusion in the bilateral frontal and occipital and left parietal white matter, left  perirolandic cortex, left thalamocapsular region, and left cerebellum, which are concerning for acute infarcts. Given involvement of multiple vascular territories, consider a central embolic etiology. 2. Contrast was administered; however, there is no evidence of enhancement, unclear why given reported uneventful injection per the technologist. This precludes evaluation for enhancing lesions. Given the patient's known small cell lung cancer diagnosis, recommend repeat postcontrast imaging to exclude metastatic disease. I've asked the MRI technologist to not bill the patient for the repeat post-contrast imaging. 3. Advanced chronic microvascular ischemic disease, progressed since 2021. 4. Moderate right and small left mastoid effusions. Electronically Signed: By: Margaretha Sheffield M.D. On: 12/20/2020 14:29   IR Perc Cholecystostomy  Result Date: 12/20/2020 INDICATION: 70 year old male with small cell lung cancer and chemotherapy induced pancytopenia presenting with neutropenic sepsis. Blood cultures grew out Klebsiella and imaging findings are concerning for acute cholecystitis. He is too sick and frail to be considered an operative candidate. Following transfusion of platelets, his platelets are now at an acceptable level to proceed with percutaneous cholecystostomy tube placement. EXAM: CHOLECYSTOSTOMY MEDICATIONS: In patient currently receiving intravenous antibiotic therapy. No additional antibiotic prophylaxis administered. ANESTHESIA/SEDATION: Moderate (conscious) sedation was employed during this procedure. A total of Versed 1 mg and Fentanyl 50 mcg and 0.5 mg Dilaudid administered intravenously. Moderate Sedation Time: 13 minutes. The patient's level of consciousness and vital signs were monitored continuously by radiology nursing throughout the procedure under my direct supervision. FLUOROSCOPY TIME:  Fluoroscopy Time: 0 minutes 30 seconds (4.3 mGy). COMPLICATIONS: None immediate. PROCEDURE: Informed  written consent was obtained from the patient after a thorough discussion of the procedural risks, benefits and alternatives. All questions were addressed. Maximal Sterile Barrier Technique was utilized including caps, mask, sterile gowns, sterile gloves, sterile drape, hand hygiene and skin antiseptic. A timeout was performed prior to the initiation of the procedure. The right upper quadrant was interrogated with ultrasound. The distended gallbladder with a thickened wall was successfully identified. There is significant excursion of the liver with respiration due to use of abdominal muscles. A suitable skin entry site was selected. Local anesthesia was attained by infiltration with 1% lidocaine. A small dermatotomy was made. Under real-time ultrasound guidance, the movement of the liver was timed with  respirations and in a single pass, a 21 gauge Accustick needle was advanced through the liver and into the gallbladder lumen along a short transhepatic course. A 0.018 wire was quickly advanced into the gallbladder lumen and the needle was exchanged for the transitional dilator. The transitional dilator was advanced into the gallbladder lumen. The wire was removed. Contrast injection was performed under fluoroscopy confirming opacification of the gallbladder lumen. Multiple filling defects consistent with cholelithiasis. The cystic duct is occluded. A 0.035 wire was then coiled within the gallbladder lumen. The transitional dilator was removed. The transhepatic tract was dilated to 10 [place] with a stiff dilator. A Cook 10.2 [place] all-purpose drainage catheter was then advanced over the wire and formed in the gallbladder lumen. Images were obtained and stored for the medical record. The catheter was gently flushed and connected to gravity bag drainage before being secured to the skin with 0 silk suture. IMPRESSION: Successful placement of 10 French transhepatic percutaneous cholecystostomy tube for an indication of  calculus cholecystitis. PLAN: Maintain tube to gravity bag drainage. Patient will likely need home health to assist with care and cleaning of the tube site. Return to interventional radiology approximately every 8 weeks for cholecystostomy tube check and exchange. If patient's clinical status improves in the future, elective cholecystectomy would be optimal. If patient remains a poor operative candidate, long-term cholecystostomy tube maintenance will likely be required. Electronically Signed   By: Jacqulynn Cadet M.D.   On: 12/20/2020 13:50   DG Chest Port 1 View  Result Date: 12/17/2020 CLINICAL DATA:  Questionable sepsis.  Weakness EXAM: PORTABLE CHEST 1 VIEW COMPARISON:  03/09/2020 FINDINGS: Left subclavian approached chest port remains in place. Stable cardiomegaly. Chronic left basilar scarring or atelectasis. Peripheral left upper lobe nodule better seen on prior CT. No new airspace consolidation. Pleural effusion or pneumothorax. IMPRESSION: Chronic left basilar scarring or atelectasis. No new or acute findings. Electronically Signed   By: Davina Poke D.O.   On: 12/17/2020 13:20   ECHOCARDIOGRAM COMPLETE  Result Date: 12/21/2020    ECHOCARDIOGRAM REPORT   Patient Name:   YASUO PHIMMASONE Date of Exam: 12/21/2020 Medical Rec #:  [id number]    Height:       72.0 in Accession #:    [id number]   Weight:       137.1 lb Date of Birth:  [birth year]     BSA:          1.815 m Patient Age:    86 years     BP:           114/80 mmHg Patient Gender: M            HR:           89 bpm. Exam Location:  [facility] Procedure: 2D Echo, Color Doppler and Cardiac Doppler Indications:     Endocarditis I38  History:         Patient has prior history of Echocardiogram examinations, most                  recent 08/19/2019. Arrythmias:Atrial Fibrillation; Risk                  Factors:Hypertension. Substance abuse.  Sonographer:     Sherrie Sport Referring Phys:  [place] Diagnosing Phys: Serafina Royals MD IMPRESSIONS  1. Left  ventricular ejection fraction, by estimation, is 60 to 65%. The left ventricle has normal function. The left ventricle has no regional wall motion abnormalities. Left  ventricular diastolic parameters were normal.  2. Right ventricular systolic function is normal. The right ventricular size is normal.  3. The mitral valve is normal in structure. Trivial mitral valve regurgitation.  4. The aortic valve is normal in structure. Aortic valve regurgitation is not visualized. Mild aortic valve sclerosis is present, with no evidence of aortic valve stenosis. Conclusion(s)/Recommendation(s): No evidence of valve vegetations. FINDINGS  Left Ventricle: Left ventricular ejection fraction, by estimation, is 60 to 65%. The left ventricle has normal function. The left ventricle has no regional wall motion abnormalities. The left ventricular internal cavity size was small. There is no left ventricular hypertrophy. Left ventricular diastolic parameters were normal. Right Ventricle: The right ventricular size is normal. No increase in right ventricular wall thickness. Right ventricular systolic function is normal. Left Atrium: Left atrial size was normal in size. Right Atrium: Right atrial size was normal in size. Pericardium: There is no evidence of pericardial effusion. Mitral Valve: The mitral valve is normal in structure. Trivial mitral valve regurgitation. Tricuspid Valve: The tricuspid valve is normal in structure. Tricuspid valve regurgitation is trivial. Aortic Valve: The aortic valve is normal in structure. Aortic valve regurgitation is not visualized. Mild aortic valve sclerosis is present, with no evidence of aortic valve stenosis. Aortic valve mean gradient measures 3.0 mmHg. Aortic valve peak gradient measures 5.9 mmHg. Aortic valve area, by VTI measures 2.30 cm. Pulmonic Valve: The pulmonic valve was normal in structure. Pulmonic valve regurgitation is not visualized. Aorta: The aortic root and ascending aorta are  structurally normal, with no evidence of dilitation. IAS/Shunts: No atrial level shunt detected by color flow Doppler.  LEFT VENTRICLE PLAX 2D LVIDd:         3.87 cm LVIDs:         2.30 cm LV PW:         1.00 cm LV IVS:        1.18 cm LVOT diam:     2.10 cm LV SV:         44 LV SV Index:   24 LVOT Area:     3.46 cm  RIGHT VENTRICLE RV Basal diam:  3.70 cm RV S prime:     12.40 cm/s TAPSE (M-mode): 3.7 cm LEFT ATRIUM           Index       RIGHT ATRIUM           Index LA diam:      3.50 cm 1.93 cm/m  RA Area:     33.70 cm LA Vol (A2C): 57.4 ml 31.63 ml/m RA Volume:   128.00 ml 70.53 ml/m LA Vol (A4C): 72.1 ml 39.73 ml/m  AORTIC VALVE                   PULMONIC VALVE AV Area (Vmax):    2.34 cm    PV Vmax:        0.60 m/s AV Area (Vmean):   2.32 cm    PV Peak grad:   1.4 mmHg AV Area (VTI):     2.30 cm    RVOT Peak grad: 2 mmHg AV Vmax:           121.00 cm/s AV Vmean:          86.000 cm/s AV VTI:            0.193 m AV Peak Grad:      5.9 mmHg AV Mean Grad:      3.0 mmHg  LVOT Vmax:         81.60 cm/s LVOT Vmean:        57.600 cm/s LVOT VTI:          0.128 m LVOT/AV VTI ratio: 0.66  AORTA Ao Root diam: 3.50 cm MITRAL VALVE               TRICUSPID VALVE MV Area (PHT): 4.52 cm    TR Peak grad:   24.4 mmHg MV Decel Time: 168 msec    TR Vmax:        247.00 cm/s MV E velocity: 88.30 cm/s                            SHUNTS                            Systemic VTI:  0.13 m                            Systemic Diam: 2.10 cm Serafina Royals MD Electronically signed by Serafina Royals MD Signature Date/Time: 12/21/2020/5:20:09 PM    Final    US Abdomen Limited RUQ (LIVER/GB)  Result Date: 12/18/2020 CLINICAL DATA:  Evaluate for cholecystitis. EXAM: ULTRASOUND ABDOMEN LIMITED RIGHT UPPER QUADRANT COMPARISON:  CT AP from 12/17/2020 FINDINGS: Gallbladder: The gallbladder wall is diffusely edematous measuring up to 13.1 mm. Gallstones are noted measuring up to 5.9 mm. Pericholecystic fluid. Positive sonographic Murphy's sign.  Common bile duct: Diameter: 6 mm Liver: Diffusely increased parenchymal echogenicity identified. No focal liver abnormality. Portal vein is patent on color Doppler imaging with normal direction of blood flow towards the liver. Other: 1.9 cm right kidney cyst. IMPRESSION: Gallstones, gallbladder wall thickening and pericholecystic fluid with positive sonographic Murphy's sign. Findings are compatible with acute cholecystitis. Electronically Signed   By: Kerby Moors M.D.   On: 12/18/2020 14:18    Assessment and plan-   #Neutropenic sepsis secondary to Klebsiella pneumonia bacteremia Ultrasound findings is compatible with acute cholecystitis.  Likely the source of infection. Urine culture-no growth. S/p cholecystostomy Patient is on IV antibiotics.   #Chemotherapy-induced pancytopenia Counts are improving. ANC normal, hb 9.1 Platelet count has improved to 83,000.  Anticipate his counts to continue to improve to be close to his baseline the next few days.  Okay from hematology to start on anticoagulation with heparin with no bolus or Eliquis 5mg  BID, if ok with neurology  # Acute right upper extremity weakness due to multiple acute infarcts Echocardiogram showed no vegetation.  Cardiology recommends deferring TEE.  Appreciate neurology and cardiology input.   # Extensive small cell lung caner,  S/p 1 cycle of chemo. Currently admitted due to chemotherapy induced pancytopenia, sepsis. Counts have improved. Consult palliative care for goals of care discussion.  Overall prognosis is poor given the disease nature. If he is able to recover from acute issues, may consider outpatient cancer treatments If patient continues to decline, to proceed with hospice. I think it is reasonable He and POA will meet palliative service today  PT evaluation, SNF placement  Thank you for allowing me to participate in the care of this patient.   Earlie Server, MD, PhD Hematology Oncology [place]  at [facility]  12/23/2020

## 2020-12-24 ENCOUNTER — Telehealth: Payer: Self-pay | Admitting: Oncology

## 2020-12-24 DIAGNOSIS — A419 Sepsis, unspecified organism: Secondary | ICD-10-CM | POA: Diagnosis not present

## 2020-12-24 DIAGNOSIS — C3492 Malignant neoplasm of unspecified part of left bronchus or lung: Secondary | ICD-10-CM | POA: Diagnosis not present

## 2020-12-24 DIAGNOSIS — F1010 Alcohol abuse, uncomplicated: Secondary | ICD-10-CM | POA: Diagnosis not present

## 2020-12-24 DIAGNOSIS — D709 Neutropenia, unspecified: Secondary | ICD-10-CM | POA: Diagnosis not present

## 2020-12-24 DIAGNOSIS — R7881 Bacteremia: Secondary | ICD-10-CM

## 2020-12-24 DIAGNOSIS — D6481 Anemia due to antineoplastic chemotherapy: Secondary | ICD-10-CM | POA: Diagnosis not present

## 2020-12-24 DIAGNOSIS — I484 Atypical atrial flutter: Secondary | ICD-10-CM | POA: Diagnosis not present

## 2020-12-24 DIAGNOSIS — B961 Klebsiella pneumoniae [K. pneumoniae] as the cause of diseases classified elsewhere: Secondary | ICD-10-CM

## 2020-12-24 LAB — CBC WITH DIFFERENTIAL/PLATELET
Abs Immature Granulocytes: 3.09 10*3/uL — ABNORMAL HIGH (ref 0.00–0.07)
Basophils Absolute: 0 10*3/uL (ref 0.0–0.1)
Basophils Relative: 0 %
Eosinophils Absolute: 0 10*3/uL (ref 0.0–0.5)
Eosinophils Relative: 0 %
HCT: 28.4 % — ABNORMAL LOW (ref 39.0–52.0)
Hemoglobin: 10.2 g/dL — ABNORMAL LOW (ref 13.0–17.0)
Immature Granulocytes: 21 %
Lymphocytes Relative: 6 %
Lymphs Abs: 0.9 10*3/uL (ref 0.7–4.0)
MCH: 35.5 pg — ABNORMAL HIGH (ref 26.0–34.0)
MCHC: 35.9 g/dL (ref 30.0–36.0)
MCV: 99 fL (ref 80.0–100.0)
Monocytes Absolute: 1.6 10*3/uL — ABNORMAL HIGH (ref 0.1–1.0)
Monocytes Relative: 11 %
Neutro Abs: 9.1 10*3/uL — ABNORMAL HIGH (ref 1.7–7.7)
Neutrophils Relative %: 62 %
Platelets: 133 10*3/uL — ABNORMAL LOW (ref 150–400)
RBC: 2.87 MIL/uL — ABNORMAL LOW (ref 4.22–5.81)
RDW: 14.2 % (ref 11.5–15.5)
Smear Review: NORMAL
WBC: 14.8 10*3/uL — ABNORMAL HIGH (ref 4.0–10.5)
nRBC: 0.2 % (ref 0.0–0.2)

## 2020-12-24 LAB — COMPREHENSIVE METABOLIC PANEL
ALT: 78 U/L — ABNORMAL HIGH (ref 0–44)
AST: 28 U/L (ref 15–41)
Albumin: 2.1 g/dL — ABNORMAL LOW (ref 3.5–5.0)
Alkaline Phosphatase: 61 U/L (ref 38–126)
Anion gap: 7 (ref 5–15)
BUN: 14 mg/dL (ref 8–23)
CO2: 23 mmol/L (ref 22–32)
Calcium: 7.7 mg/dL — ABNORMAL LOW (ref 8.9–10.3)
Chloride: 112 mmol/L — ABNORMAL HIGH (ref 98–111)
Creatinine, Ser: 0.66 mg/dL (ref 0.61–1.24)
GFR, Estimated: >60 mL/min (ref >60)
Glucose, Bld: 94 mg/dL (ref 70–99)
Potassium: 4.1 mmol/L (ref 3.5–5.1)
Sodium: 142 mmol/L (ref 135–145)
Total Bilirubin: 0.9 mg/dL (ref 0.3–1.2)
Total Protein: 5.2 g/dL — ABNORMAL LOW (ref 6.5–8.1)

## 2020-12-24 LAB — MAGNESIUM: Magnesium: 1.8 mg/dL (ref 1.7–2.4)

## 2020-12-24 LAB — PHOSPHORUS: Phosphorus: 2.8 mg/dL (ref 2.5–4.6)

## 2020-12-24 MED ORDER — APIXABAN 5 MG PO TABS
5.0000 mg | ORAL_TABLET | Freq: Two times a day (BID) | ORAL | Status: DC
  Administered 2020-12-24 (×2): 5 mg via ORAL
  Filled 2020-12-24 (×2): qty 1

## 2020-12-24 NOTE — Telephone Encounter (Signed)
Patient has very poor oral intake and severe protein calorie malnutrition.  I called POA [name] and discussed about goals of care which is extremely poor at point giving his incurable disease, poor nutrition status, multiple comorbidities, poor performance status,  likelihood of further decline of his condition. There is slim chance that he will recover to be strong enough to receive future chemotherapy.  I discussed about hospice option. Aniceto Boss will further discuss with patient today and update team if he is agreeable  For now, Aniceto Boss wants to continue current scope of care for now.

## 2020-12-24 NOTE — Progress Notes (Signed)
Nutrition Follow-up  DOCUMENTATION CODES:   Severe malnutrition in context of chronic illness  INTERVENTION:   Vital Cuisine TID, each supplement provides 520kcal and 22g of protein.   Magic cup TID with meals, each supplement provides 290 kcal and 9 grams of protein  Dysphagia 3 diet   MVI po daily   Pt at high refeed risk; recommend monitor potassium, magnesium and phosphorus labs daily until stable  Recommend consideration or G-tube if patient and family wish to continue with full agressive measures.   NUTRITION DIAGNOSIS:   Severe Malnutrition related to cancer and cancer related treatments as evidenced by severe fat depletion, severe muscle depletion. -new diagnosis   GOAL:   Patient will meet greater than or equal to 90% of their needs -not met   MONITOR:   PO intake, Supplement acceptance, Labs, Weight trends, Skin, I & O's  ASSESSMENT:   70 year old male with PMH of 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 fib not on AC, chronic hep C, chronic cancer pain, HTN, alcohol abuse, malnutrition and polysubstance who is admitted with neutropenic sepsis due to acute cholecystitis and Klebsiella bacteremia now s/p cholecystostomy on 9/11 by IR. Hospital admission complicated by acute thromboembolic stroke with new right-sided weakness-confirmed on MRI brain on 9/11.  Met with pt in room today. Pt is a poor historian but reports poor appetite and oral intake at baseline. Pt's lunch tray is sitting on his side table with only bites taken from it. Pt reports that the meat is too tough to chew but he had pot roast on his tray which is pretty tender. Pt did eat about 2/3 of a cup of ice cream. Pt documented to be eating <40% of meals. Pt has been refusing all supplements; he has been offered Ensure, Boost and Nepro. Pt reports that he does not want any supplements as he does not like them and he also declines snacks between meals. Pt does  report that he likes ice cream. RD discussed with pt the importance of adequate nutrition needed to preserve lean muscle. RD will add supplements to pt's meal trays. RD will also change pt over to a mechanical soft diet. Pt with severe malnutrition, is eating only sips/bites of meals on appetite stimulant and is refusing all supplements. If pt and family wishes to continue with full aggressive measures and chemotherapy, would recommend placement of G-tube and nutrition support. Pt is at high refeed risk. Pt would also likely benefit from SLP evaluation. Per chart, pt documented to be up ~20lbs since admission; RD unsure is bed weights are correct. Pt +1.1L on his I & Os. Drains in place with 361m output.   Medications reviewed and include: aspirin, marinol, folic acid, MVI, thiamine, ceftriaxone, metronidazole   Labs reviewed: K 4.1 wnl, P 2.8 wnl, Mg 1.8 wnl Wbc- 14.8(H), Hgb 10.2(L), Hct 28.4(L)  NUTRITION - FOCUSED PHYSICAL EXAM:  Flowsheet Row Most Recent Value  Orbital Region Severe depletion  Upper Arm Region Severe depletion  Thoracic and Lumbar Region Severe depletion  Buccal Region Severe depletion  Temple Region Severe depletion  Clavicle Bone Region Severe depletion  Clavicle and Acromion Bone Region Severe depletion  Scapular Bone Region Severe depletion  Dorsal Hand Severe depletion  Patellar Region Severe depletion  Anterior Thigh Region Severe depletion  Posterior Calf Region Severe depletion  Edema (RD Assessment) None  Hair Reviewed  Eyes Reviewed  Mouth Reviewed  Skin Reviewed  Nails Reviewed   Diet Order:  Diet Order             DIET DYS 3 Room service appropriate? Yes; Fluid consistency: Nectar Thick; Fluid restriction: 2000 mL Fluid  Diet effective now                  EDUCATION NEEDS:   Education needs have been addressed  Skin:  Skin Assessment: Skin Integrity Issues: Skin Integrity Issues:: DTI DTI: R heel; L heel  Last BM:  9/15- type  7  Height:   Ht Readings from Last 1 Encounters:  12/18/20 6' (1.829 m)    Weight:   Wt Readings from Last 1 Encounters:  12/18/20 62.2 kg    Ideal Body Weight:  80.9 kg  BMI:  Body mass index is 18.6 kg/m.  Estimated Nutritional Needs:   Kcal:  2000-2300kcal/day  Protein:  100-115g/day  Fluid:  1.6-1.9L/day  Koleen Distance MS, RD, LDN Please refer to [facility] for RD and/or RD on-call/weekend/after hours pager

## 2020-12-24 NOTE — Progress Notes (Signed)
PROGRESS NOTE  Scott Jordan NOB:[id number] DOB: [birth year]   PCP: Earlie Server, MD  Patient is from: Home.   DOA: 12/17/2020 LOS: 7  Chief complaints:  Chief Complaint  Patient presents with   Emesis     Brief Narrative / Interim history: 70 year old M with PMH of stage IV small cell lung cancer on chemo, chemo induced pancytopenia, A. fib not on AC, chronic hep C, chronic cancer pain, HTN, alcohol abuse, malnutrition and polysubstance use presenting with nausea and vomiting and admitted with neutropenic sepsis due to acute cholecystitis and Klebsiella bacteremia.  He had cholecystostomy on 9/11 by IR.   Hospital course complicated by acute thromboembolic CVA with new right-sided weakness for which she was evaluated by neurology and started on aspirin.  Eventually, thrombocytopenia improved and he was transitioned to Eliquis.  Therapy recommended SNF.  See individual problem list below for more hospital course.  Subjective: Seen and examined earlier this morning.  No major events overnight of this morning.  Feels weak.  Also reports "some" shortness of breath.  Denies chest pain, GI or UTI symptoms.  Objective: Vitals:   12/23/20 1506 12/23/20 2101 12/24/20 0440 12/24/20 0855  BP: 121/87 137/79 (!) 163/140 (!) 149/105  Pulse: 91 (!) 102 (!) 106 81  Resp: 20 20 15 18   Temp: 98.4 F (36.9 C) 98 F (36.7 C) 98.7 F (37.1 C) 97.6 F (36.4 C)  TempSrc: Oral   Oral  SpO2: 94% 96% 93% 98%  Weight:      Height:        Intake/Output Summary (Last 24 hours) at 12/24/2020 1327 Last data filed at 12/24/2020 0954 Gross per 24 hour  Intake 505 ml  Output 1215 ml  Net -710 ml   Filed Weights   12/17/20 1203 12/18/20 2130  Weight: 61.7 kg 62.2 kg    Examination:  GENERAL: No apparent distress.  Nontoxic. HEENT: MMM.  Vision and hearing grossly intact.  NECK: Supple.  No apparent JVD.  RESP: 93% on RA.  No IWOB.  Fair aeration bilaterally. CVS:  RRR. Heart sounds normal.   ABD/GI/GU: BS+. Abd soft.  Mild RUQ tenderness.  Percutaneous biliary drain in place. MSK/EXT:  Moves extremities. No apparent deformity. No edema.  SKIN: no apparent skin lesion or wound NEURO: Awake and fairly oriented.  All extremity weakness.  PSYCH: Calm. Normal affect.   Procedures:  9/11-percutaneous biliary drain by IR  Microbiology summarized: 9/8-COVID-19 and influenza PCR nonreactive. 9/8-C. difficile negative. 9/8-blood culture with pansensitive Klebsiella pneumonia except to ampicillin 9/8-urine culture NGTD 9/8-MRSA PCR screen positive 9/13-repeat blood cultures NGTD  Assessment & Plan: Neutropenic sepsis-neutropenia resolved. Septicemia secondary to Klebsiella pneumoniae bacteremia. Cholelithiasis with cholecystitis secondary to Klebsiella pneumoniae. -S/p percutaneous biliary drain by IR on 9/11. -TTE negative for vegetation.  -General surgery recommended outpatient follow-up in 6 to 8 weeks and signed off -Continue IV ceftriaxone and IV Flagyl-can be discharged on p.o. cefdinir and p.o. Flagyl    Stage IV small cell lung cancer with osseous mets-s/p 1 cycle of chemo Pancytopenia related to chemotherapy-improved. -Received 3 units of platelets this hospitalization. -H&H stable.  Leukopenia resolved.  Thrombocytopenia improving. -Okay to start anticoagulation from hematology standpoint.   Acute thromboembolic stroke with new right-sided weakness-confirmed on MRI brain on 9/11..   -Cardiology and neurology did not feel TEE is necessary with his thrombocytopenia and risk for aspiration -Initially, neurology recommended low-dose aspirin in the setting of thrombocytopenia.  Now thrombocytopenia resolving -Resume home Eliquis.  Discontinue aspirin.  I do not see any other indication at this time. -No indication for statin.  LDL 37.8.  Paroxysmal atrial fibrillation-on Cardizem.  Seems to be on Eliquis previously -Continue p.o. Cardizem -Resume Eliquis as above    AKI/azotemia: Resolved. Recent Labs    11/05/20 0820 12/08/20 0818 12/17/20 1205 12/18/20 0500 12/19/20 0457 12/20/20 0500 12/21/20 0434 12/22/20 0513 12/23/20 0305 12/24/20 0506  BUN 17 18 31* 48* 43* 36* 30* 21 17 14   CREATININE 0.80 0.89 1.45* 1.46* 0.94 0.72 0.57* 0.59* 0.69 0.66  -Monitor intermittently  Hypokalemia/hypomagnesemia resolved.  Hyponatremia: Resolved.   Alcohol use disorder: No withdrawal signs or symptoms.  Physical deconditioning/debility -Continue PT/OT -Encourage OOB and incentive spirometry   Goals of care-patient with stage IV small cell lung cancer, acute thromboembolic CVA and other comorbidity as above.  Very poor long-term prognosis.   -Appreciate help by palliative care.  Changed CODE STATUS to DNR/DNI  Dysphagia?   -SLP eval  Severe protein calorie malnutrition/anorexia-significant muscle mass loss.  Likely due to malignancy and chemo. -On Marinol and supplements Body mass index is 18.6 kg/m. Nutrition Problem: Increased nutrient needs Etiology: acute illness, cancer and cancer related treatments, chronic illness Signs/Symptoms: estimated needs Interventions: Prostat, MVI, Ensure Enlive (each supplement provides 350kcal and 20 grams of protein)  Pressure skin injury: Pressure Injury 12/18/20 Heel Right Deep Tissue Pressure Injury - Purple or maroon localized area of discolored intact skin or blood-filled blister due to damage of underlying soft tissue from pressure and/or shear. (Active)  12/18/20 2200  Location: Heel  Location Orientation: Right  Staging: Deep Tissue Pressure Injury - Purple or maroon localized area of discolored intact skin or blood-filled blister due to damage of underlying soft tissue from pressure and/or shear.  Wound Description (Comments):   Present on Admission:      Pressure Injury 12/18/20 Heel Left Deep Tissue Pressure Injury - Purple or maroon localized area of discolored intact skin or blood-filled  blister due to damage of underlying soft tissue from pressure and/or shear. (Active)  12/18/20 2200  Location: Heel  Location Orientation: Left  Staging: Deep Tissue Pressure Injury - Purple or maroon localized area of discolored intact skin or blood-filled blister due to damage of underlying soft tissue from pressure and/or shear.  Wound Description (Comments):   Present on Admission:    DVT prophylaxis:  Place and maintain sequential compression device Start: 12/18/20 1303 Place TED hose Start: 12/17/20 1523 apixaban (ELIQUIS) tablet 5 mg  Code Status: Full code Family Communication: Patient and/or RN.  Updated patient's niece over the phone on 9/14. Level of care: Med-Surg Status is: Inpatient  Remains inpatient appropriate because:Unsafe d/c plan, IV treatments appropriate due to intensity of illness or inability to take PO, and Inpatient level of care appropriate due to severity of illness  Dispo: The patient is from: Home              Anticipated d/c is to: SNF              Patient currently is not medically stable to d/c.   Difficult to place patient No       Consultants:  Neurology-signed off Cardiology-signed off Oncology Palliative medicine   Sch Meds:  Scheduled Meds:  sodium chloride   Intravenous Once   sodium chloride   Intravenous Once   apixaban  5 mg Oral BID   aspirin  81 mg Oral Daily   chlorhexidine  10 mL Mouth/Throat BID   Chlorhexidine Gluconate Cloth  6  each Topical Daily   diltiazem  240 mg Oral Daily   dronabinol  2.5 mg Oral BID AC   folic acid  1 mg Oral Daily   multivitamin with minerals  1 tablet Oral Daily   sodium chloride flush  10-40 mL Intracatheter Q12H   sodium chloride flush  5 mL Intracatheter Q8H   thiamine  100 mg Oral Daily   Continuous Infusions:  sodium chloride 250 mL (12/24/20 0953)   sodium chloride 10 mL/hr (12/20/20 1220)   cefTRIAXone (ROCEPHIN)  IV 2 g (12/24/20 0510)   metronidazole 500 mg (12/24/20 0954)    PRN Meds:.sodium chloride, sodium chloride, acetaminophen **OR** acetaminophen, diltiazem, ondansetron **OR** ondansetron (ZOFRAN) IV, oxyCODONE-acetaminophen, sodium chloride flush  Antimicrobials: Anti-infectives (From admission, onward)    Start     Dose/Rate Route Frequency Ordered Stop   12/21/20 1200  metroNIDAZOLE (FLAGYL) IVPB 500 mg        500 mg 100 mL/hr over 60 Minutes Intravenous Every 12 hours 12/21/20 1023     12/19/20 0600  cefTRIAXone (ROCEPHIN) 2 g in sodium chloride 0.9 % 100 mL IVPB        2 g 200 mL/hr over 30 Minutes Intravenous Every 24 hours 12/18/20 0842     12/18/20 1800  vancomycin (VANCOREADY) IVPB 750 mg/150 mL  Status:  Discontinued        750 mg 150 mL/hr over 60 Minutes Intravenous Every 24 hours 12/17/20 1820 12/17/20 1830   12/18/20 1800  vancomycin (VANCOREADY) IVPB 750 mg/150 mL  Status:  Discontinued        750 mg 150 mL/hr over 60 Minutes Intravenous Every 24 hours 12/17/20 1830 12/18/20 0842   12/18/20 0600  ceFEPIme (MAXIPIME) 2 g in sodium chloride 0.9 % 100 mL IVPB  Status:  Discontinued        2 g 200 mL/hr over 30 Minutes Intravenous Every 12 hours 12/17/20 1544 12/17/20 1830   12/18/20 0600  ceFEPIme (MAXIPIME) 2 g in sodium chloride 0.9 % 100 mL IVPB        2 g 200 mL/hr over 30 Minutes Intravenous Every 12 hours 12/17/20 1830 12/18/20 1841   12/17/20 1700  vancomycin (VANCOREADY) IVPB 1500 mg/300 mL        1,500 mg 150 mL/hr over 120 Minutes Intravenous  Once 12/17/20 1544 12/17/20 2050   12/17/20 1600  metroNIDAZOLE (FLAGYL) IVPB 500 mg  Status:  Discontinued        500 mg 100 mL/hr over 60 Minutes Intravenous Every 12 hours 12/17/20 1524 12/18/20 0842   12/17/20 1230  cefTRIAXone (ROCEPHIN) 2 g in sodium chloride 0.9 % 100 mL IVPB  Status:  Discontinued        2 g 200 mL/hr over 30 Minutes Intravenous Every 24 hours 12/17/20 1215 12/17/20 1543        I have personally reviewed the following labs and images: CBC: Recent Labs   Lab 12/18/20 0857 12/19/20 0457 12/20/20 0047 12/20/20 0500 12/21/20 0434 12/22/20 0513 12/23/20 0305 12/24/20 0506  WBC 0.4* 0.6*  --  1.7* 5.3 9.7 11.1* 14.8*  NEUTROABS 0.1* 0.3*  --  0.7* 3.5  --   --  9.1*  HGB 9.2* 8.9*  --  8.9* 8.8* 9.1* 9.0* 10.2*  HCT 25.6* 23.8*  --  24.0* 24.9* 25.0* 26.0* 28.4*  MCV 94.1 94.4  --  94.5 97.6 96.2 96.7 99.0  PLT 16* 9*   < > 42* 41* 51* 83* 133*   < > =  values in this interval not displayed.   BMP &GFR Recent Labs  Lab 12/19/20 0457 12/20/20 0500 12/21/20 0434 12/22/20 0513 12/23/20 0305 12/24/20 0506  NA 138 137 141 141 141 142  K 3.3* 3.4* 3.6 3.5 3.0* 4.1  CL 110 107 111 110 109 112*  CO2 22 22 22 23 24 23   GLUCOSE 118* 112* 99 115* 90 94  BUN 43* 36* 30* 21 17 14   CREATININE 0.94 0.72 0.57* 0.59* 0.69 0.66  CALCIUM 7.0* 7.6* 7.8* 7.9* 7.6* 7.7*  MG 1.6* 2.1 2.0  --  1.7 1.8  PHOS  --   --  1.6*  --   --  2.8   Estimated Creatinine Clearance: 76.7 mL/min (by C-G formula based on SCr of 0.66 mg/dL). Liver & Pancreas: Recent Labs  Lab 12/24/20 0506  AST 28  ALT 78*  ALKPHOS 61  BILITOT 0.9  PROT 5.2*  ALBUMIN 2.1*   No results for input(s): LIPASE, AMYLASE in the last 168 hours. No results for input(s): AMMONIA in the last 168 hours. Diabetic: No results for input(s): HGBA1C in the last 72 hours.  Recent Labs  Lab 12/18/20 2134 12/21/20 0809 12/21/20 1227  GLUCAP 73 118* 125*   Cardiac Enzymes: No results for input(s): CKTOTAL, CKMB, CKMBINDEX, TROPONINI in the last 168 hours. No results for input(s): PROBNP in the last 8760 hours. Coagulation Profile: Recent Labs  Lab 12/18/20 0500  INR 1.6*   Thyroid Function Tests: No results for input(s): TSH, T4TOTAL, FREET4, T3FREE, THYROIDAB in the last 72 hours. Lipid Profile: No results for input(s): CHOL, HDL, LDLCALC, TRIG, CHOLHDL, LDLDIRECT in the last 72 hours.  Anemia Panel: No results for input(s): VITAMINB12, FOLATE, FERRITIN, TIBC, IRON,  RETICCTPCT in the last 72 hours. Urine analysis:    Component Value Date/Time   COLORURINE AMBER (A) 12/17/2020 1840   APPEARANCEUR TURBID (A) 12/17/2020 1840   LABSPEC 1.030 12/17/2020 1840   PHURINE 5.0 12/17/2020 1840   GLUCOSEU 100 (A) 12/17/2020 1840   HGBUR LARGE (A) 12/17/2020 1840   BILIRUBINUR MODERATE (A) 12/17/2020 1840   KETONESUR TRACE (A) 12/17/2020 1840   PROTEINUR 100 (A) 12/17/2020 1840   NITRITE POSITIVE (A) 12/17/2020 1840   LEUKOCYTESUR NEGATIVE 12/17/2020 1840   Sepsis Labs: Invalid input(s): PROCALCITONIN, [place]  Microbiology: Recent Results (from the past 240 hour(s))  Blood Culture (routine x 2)     Status: Abnormal   Collection Time: 12/17/20 12:05 PM   Specimen: BLOOD  Result Value Ref Range Status   Specimen Description   Final    BLOOD LEFT CHEST PORT Performed at [facility], [address] [place], [place] [address]    Special Requests   Final    BOTTLES DRAWN AEROBIC AND ANAEROBIC Blood Culture adequate volume Performed at [facility], [address], [place], [place] [address]    Culture  Setup Time   Final    GRAM NEGATIVE RODS IN BOTH AEROBIC AND ANAEROBIC BOTTLES CRITICAL VALUE NOTED.  VALUE IS CONSISTENT WITH PREVIOUSLY REPORTED AND CALLED VALUE. Performed at [facility], [address] [place], [place] [address]    Culture (A)  Final    KLEBSIELLA PNEUMONIAE SUSCEPTIBILITIES PERFORMED ON PREVIOUS CULTURE WITHIN THE LAST 5 DAYS. Performed at [facility], Between [address] [place],  [address]    Report Status 12/20/2020 FINAL  Final  Blood Culture (routine x 2)     Status: Abnormal   Collection Time: 12/17/20 12:05 PM  Specimen: BLOOD  Result Value Ref Range Status   Specimen Description   Final    BLOOD LEFT CHEST Performed at [facility], [address], [place], [place] [address]    Special Requests   Final    BOTTLES DRAWN AEROBIC AND ANAEROBIC Blood Culture  adequate volume Performed at [facility], [address] [place], [place] [address]    Culture  Setup Time   Final    GRAM NEGATIVE RODS IN BOTH AEROBIC AND ANAEROBIC BOTTLES CRITICAL RESULT CALLED TO, READ BACK BY AND VERIFIED WITH: [place] [id number] 12/18/20 HNM Performed at [facility], [address], [place], [place] [address]    Culture KLEBSIELLA PNEUMONIAE (A)  Final   Report Status 12/20/2020 FINAL  Final   Organism ID, Bacteria KLEBSIELLA PNEUMONIAE  Final      Susceptibility   Klebsiella pneumoniae - MIC*    AMPICILLIN >=32 RESISTANT Resistant     CEFAZOLIN <=4 SENSITIVE Sensitive     CEFEPIME <=0.12 SENSITIVE Sensitive     CEFTAZIDIME <=1 SENSITIVE Sensitive     CEFTRIAXONE <=0.25 SENSITIVE Sensitive     CIPROFLOXACIN <=0.25 SENSITIVE Sensitive     GENTAMICIN <=1 SENSITIVE Sensitive     IMIPENEM <=0.25 SENSITIVE Sensitive     TRIMETH/SULFA <=20 SENSITIVE Sensitive     AMPICILLIN/SULBACTAM 4 SENSITIVE Sensitive     PIP/TAZO <=4 SENSITIVE Sensitive     * KLEBSIELLA PNEUMONIAE  Blood Culture ID Panel (Reflexed)     Status: Abnormal   Collection Time: 12/17/20 12:05 PM  Result Value Ref Range Status   Enterococcus faecalis NOT DETECTED NOT DETECTED Final   Enterococcus Faecium NOT DETECTED NOT DETECTED Final   Listeria monocytogenes NOT DETECTED NOT DETECTED Final   Staphylococcus species NOT DETECTED NOT DETECTED Final   Staphylococcus aureus (BCID) NOT DETECTED NOT DETECTED Final   Staphylococcus epidermidis NOT DETECTED NOT DETECTED Final   Staphylococcus lugdunensis NOT DETECTED NOT DETECTED Final   Streptococcus species NOT DETECTED NOT DETECTED Final   Streptococcus agalactiae NOT DETECTED NOT DETECTED Final   Streptococcus pneumoniae NOT DETECTED NOT DETECTED Final   Streptococcus pyogenes NOT DETECTED NOT DETECTED Final   A.calcoaceticus-baumannii NOT DETECTED NOT DETECTED Final   Bacteroides fragilis NOT DETECTED NOT DETECTED Final    Enterobacterales DETECTED (A) NOT DETECTED Final    Comment: Enterobacterales represent a large order of gram negative bacteria, not a single organism. CRITICAL RESULT CALLED TO, READ BACK BY AND VERIFIED WITH: [place] RN [phone] 12/18/20 HNM    Enterobacter cloacae complex NOT DETECTED NOT DETECTED Final   Escherichia coli NOT DETECTED NOT DETECTED Final   Klebsiella aerogenes NOT DETECTED NOT DETECTED Final   Klebsiella oxytoca NOT DETECTED NOT DETECTED Final   Klebsiella pneumoniae DETECTED (A) NOT DETECTED Final    Comment: CRITICAL RESULT CALLED TO, READ BACK BY AND VERIFIED WITH: [place] RN [phone] 12/18/20 HNM    Proteus species NOT DETECTED NOT DETECTED Final   Salmonella species NOT DETECTED NOT DETECTED Final   Serratia marcescens NOT DETECTED NOT DETECTED Final   Haemophilus influenzae NOT DETECTED NOT DETECTED Final   Neisseria meningitidis NOT DETECTED NOT DETECTED Final   Pseudomonas aeruginosa NOT DETECTED NOT DETECTED Final   Stenotrophomonas maltophilia NOT DETECTED NOT DETECTED Final   Candida albicans NOT DETECTED NOT DETECTED Final   Candida auris NOT DETECTED NOT DETECTED Final   Candida glabrata NOT DETECTED NOT DETECTED Final   Candida krusei NOT DETECTED NOT DETECTED Final  Candida parapsilosis NOT DETECTED NOT DETECTED Final   Candida tropicalis NOT DETECTED NOT DETECTED Final   Cryptococcus neoformans/gattii NOT DETECTED NOT DETECTED Final   CTX-M ESBL NOT DETECTED NOT DETECTED Final   Carbapenem resistance IMP NOT DETECTED NOT DETECTED Final   Carbapenem resistance KPC NOT DETECTED NOT DETECTED Final   Carbapenem resistance NDM NOT DETECTED NOT DETECTED Final   Carbapenem resist OXA 48 LIKE NOT DETECTED NOT DETECTED Final   Carbapenem resistance VIM NOT DETECTED NOT DETECTED Final    Comment: Performed at [facility], [address] [place], [place] [address]  Resp Panel by RT-PCR (Flu A&B, Covid) Nasopharyngeal Swab     Status: None    Collection Time: 12/17/20 12:37 PM   Specimen: Nasopharyngeal Swab; Nasopharyngeal(NP) swabs in vial transport medium  Result Value Ref Range Status   SARS Coronavirus 2 by RT PCR NEGATIVE NEGATIVE Final    Comment: (NOTE) SARS-CoV-2 target nucleic acids are NOT DETECTED.  The SARS-CoV-2 RNA is generally detectable in upper respiratory specimens during the acute phase of infection. The lowest concentration of SARS-CoV-2 viral copies this assay can detect is 138 copies/mL. A negative result does not preclude SARS-Cov-2 infection and should not be used as the sole basis for treatment or other patient management decisions. A negative result may occur with  improper specimen collection/handling, submission of specimen other than nasopharyngeal swab, presence of viral mutation(s) within the areas targeted by this assay, and inadequate number of viral copies(<138 copies/mL). A negative result must be combined with clinical observations, patient history, and epidemiological information. The expected result is Negative.  Fact Sheet for Patients:  EntrepreneurPulse.com.au  Fact Sheet for Healthcare Providers:  IncredibleEmployment.be  This test is no t yet approved or cleared by the [place] FDA and  has been authorized for detection and/or diagnosis of SARS-CoV-2 by FDA under an Emergency Use Authorization (EUA). This EUA will remain  in effect (meaning this test can be used) for the duration of the COVID-19 declaration under Section 564(b)(1) of the Act, 21 U.S.C.section 360bbb-3(b)(1), unless the authorization is terminated  or revoked sooner.       Influenza A by PCR NEGATIVE NEGATIVE Final   Influenza B by PCR NEGATIVE NEGATIVE Final    Comment: (NOTE) The Xpert Xpress SARS-CoV-2/FLU/RSV plus assay is intended as an aid in the diagnosis of influenza from Nasopharyngeal swab specimens and should not be used as a sole basis for treatment.  Nasal washings and aspirates are unacceptable for Xpert Xpress SARS-CoV-2/FLU/RSV testing.  Fact Sheet for Patients: EntrepreneurPulse.com.au  Fact Sheet for Healthcare Providers: IncredibleEmployment.be  This test is not yet approved or cleared by the [place] FDA and has been authorized for detection and/or diagnosis of SARS-CoV-2 by FDA under an Emergency Use Authorization (EUA). This EUA will remain in effect (meaning this test can be used) for the duration of the COVID-19 declaration under Section 564(b)(1) of the Act, 21 U.S.C. section 360bbb-3(b)(1), unless the authorization is terminated or revoked.  Performed at [facility], [address] [place], [place] [address]   Urine Culture     Status: None   Collection Time: 12/17/20  3:32 PM   Specimen: In/Out Cath Urine  Result Value Ref Range Status   Specimen Description   Final    IN/OUT CATH URINE Performed at [facility], [address], [place], [place] [address]    Special Requests   Final    NONE Performed at [facility], [place]  [address], [place], [place] [address]    Culture   Final    NO GROWTH Performed at [facility], [place] [address], [place], [place] [address]    Report Status 12/19/2020 FINAL  Final  C Difficile Quick Screen w PCR reflex     Status: None   Collection Time: 12/17/20  3:32 PM   Specimen: STOOL  Result Value Ref Range Status   C Diff antigen NEGATIVE NEGATIVE Final   C Diff toxin NEGATIVE NEGATIVE Final   C Diff interpretation No C. difficile detected.  Final    Comment: Performed at [facility], [address] [place], [place] [address]  MRSA Next Gen by PCR, Nasal     Status: Abnormal   Collection Time: 12/18/20  9:55 PM   Specimen: Nasal Mucosa; Nasal Swab  Result Value Ref Range Status   MRSA by PCR Next Gen DETECTED (A) NOT DETECTED Final    Comment: RESULT CALLED TO, READ BACK BY AND  VERIFIED WITH: BETH BUONO @ 2327 12/18/20 LFD (NOTE) The GeneXpert MRSA Assay (FDA approved for NASAL specimens only), is one component of a comprehensive MRSA colonization surveillance program. It is not intended to diagnose MRSA infection nor to guide or monitor treatment for MRSA infections. Test performance is not FDA approved in patients less than 105 years old. Performed at [facility], [address] [place], [place] [address]   CULTURE, BLOOD (ROUTINE X 2) w Reflex to ID Panel     Status: None (Preliminary result)   Collection Time: 12/22/20  2:14 PM   Specimen: BLOOD  Result Value Ref Range Status   Specimen Description BLOOD LEFT ANTECUBITAL  Final   Special Requests   Final    BOTTLES DRAWN AEROBIC AND ANAEROBIC Blood Culture adequate volume   Culture   Final    NO GROWTH 2 DAYS Performed at [facility], [address] [place], [place] [address]    Report Status PENDING  Incomplete  CULTURE, BLOOD (ROUTINE X 2) w Reflex to ID Panel     Status: None (Preliminary result)   Collection Time: 12/22/20  2:15 PM   Specimen: BLOOD  Result Value Ref Range Status   Specimen Description BLOOD RIGHT ANTECUBITAL  Final   Special Requests   Final    BOTTLES DRAWN AEROBIC AND ANAEROBIC Blood Culture adequate volume   Culture   Final    NO GROWTH 2 DAYS Performed at [facility], [address] [place], [place] [address]    Report Status PENDING  Incomplete    Radiology Studies: No results found.    Tamarah Bhullar T. [place]  If 7PM-7AM, please contact night-coverage [email or web address] 12/24/2020, 1:27 PM

## 2020-12-24 NOTE — Consult Note (Signed)
NAME: Scott Jordan  DOB: July 30, 1950  MRN: [id number]  Date/Time: 12/24/2020 6:11 PM  REQUESTING PROVIDER: Cyndia Skeeters Subjective:  REASON FOR CONSULT: bacteremia  ? Scott Jordan is a 70 y.o. male with a history of small cell lung cancer with recurrent left sided pleural effusion , hepc Afib, He presented to ED on 9/8 by EMS with general weakness,  vomiting blood, unable to eat.  He had recently chemotherapy on 12/08/2020 with carboplatin.  BP in the ED was 97/84, Temp 98.6, HR 86 and RR 18. Labs showed wbc 0.4, HB 9.8, PLT 8 and cr 1.45 Blood culture was sent.  He was started on broad-spectrum antibiotic. CT abdomen showed Gallbladder appears slightly distended and there are gallstones. CT head during this admission showed  Atrophy.     Blood culture sent and started on broad spectrum antibiotics- vanco/cefepime/metronidazole Cholecystomy on 12/20/20   he underwent  MRI of the brain which showed multiple small areas of restricted diffusion in the bilateral frontal and occipital and left parietal white matter, left perirolandic cortex, left thalamocapsular region, and left cerebellum, which are concerning for acute infarcts. Given involvement of multiple vascular territories, consider a central embolic etiology.  Seen by neurology and cardiology.  There was a concern whether it was cardiac in origin.  He had A. fib but was not taking Eliquis at home because of thrombocytopenia.  TEE was recommended but because of this underlying condition including thrombocytopenia and risk for aspiration it was not done. I am seeing the patient because blood cultures came back positive for Klebsiella. Patient has a port in place.  Past Medical History:  Diagnosis Date   A-fib ([place]) 01/10/2019   pt st this was dx by Dr. Ubaldo Glassing   Cancer [facility]) 6213   LUNG   Complication of anesthesia    DIFFICULTY WAKING UP AFTER SURGERY- 46 YRS AGO   Hypertension    Lung cancer ([place])    Substance abuse ([place])     Past Surgical  History:  Procedure Laterality Date   ARM DEBRIDEMENT Left    INCISION AND DEBRIDEMENT LOWER ARM -69 YRS AGO   BACK SURGERY     IR PERC CHOLECYSTOSTOMY  12/20/2020   PORTACATH PLACEMENT Left 08/29/2019   Procedure: INSERTION PORT-A-CATH;  Surgeon: Nestor Lewandowsky, MD;  Location: [facility] ORS;  Service: General;  Laterality: Left;    Social History   Socioeconomic History   Marital status: Single    Spouse name: Not on file   Number of children: Not on file   Years of education: Not on file   Highest education level: Not on file  Occupational History   Not on file  Tobacco Use   Smoking status: Former    Packs/day: 0.50    Types: Cigarettes   Smokeless tobacco: Never   Tobacco comments:    not smoked since 6 weeks ago  Vaping Use   Vaping Use: Never used  Substance and Sexual Activity   Alcohol use: Yes    Comment: 9 OZ IN WEEK   Drug use: Not Currently    Types: Heroin    Comment: heroin WITHIN PAST YEAR   Sexual activity: Not on file  Other Topics Concern   Not on file  Social History Narrative   Not on file   Social Determinants of Health   Financial Resource Strain: Not on file  Food Insecurity: Not on file  Transportation Needs: Not on file  Physical Activity: Not on file  Stress: Not on  file  Social Connections: Not on file  Intimate Partner Violence: Not on file    History reviewed. No pertinent family history. No Known Allergies I? Current Facility-Administered Medications  Medication Dose Route Frequency Provider Last Rate Last Admin   0.9 %  sodium chloride infusion (Manually program via Guardrails IV Fluids)   Intravenous Once Sharen Hones, MD       0.9 %  sodium chloride infusion (Manually program via Guardrails IV Fluids)   Intravenous Once Sharen Hones, MD       0.9 %  sodium chloride infusion   Intravenous PRN Sharen Hones, MD 10 mL/hr at 12/24/20 1500 Infusion Verify at 12/24/20 1500   0.9 %  sodium chloride infusion    Continuous PRN Criselda Peaches, MD 10 mL/hr at 12/20/20 1220 10 mL/hr at 12/20/20 1220   acetaminophen (TYLENOL) tablet 650 mg  650 mg Oral Q6H PRN Cox, Amy N, DO       Or   acetaminophen (TYLENOL) suppository 650 mg  650 mg Rectal Q6H PRN Cox, Amy N, DO       apixaban (ELIQUIS) tablet 5 mg  5 mg Oral BID Wendee Beavers T, MD   5 mg at 12/24/20 0932   aspirin chewable tablet 81 mg  81 mg Oral Daily Derek Jack, MD   81 mg at 12/24/20 6712   cefTRIAXone (ROCEPHIN) 2 g in sodium chloride 0.9 % 100 mL IVPB  2 g Intravenous Q24H Sharen Hones, MD   Stopped at 12/24/20 0540   chlorhexidine (PERIDEX) 0.12 % solution 10 mL  10 mL Mouth/Throat BID Earlie Server, MD   10 mL at 12/24/20 0947   Chlorhexidine Gluconate Cloth 2 % PADS 6 each  6 each Topical Daily Cox, Amy N, DO   6 each at 12/24/20 4580   diltiazem (CARDIZEM CD) 24 hr capsule 240 mg  240 mg Oral Daily Cox, Amy N, DO   240 mg at 12/24/20 9983   diltiazem (CARDIZEM) injection 10 mg  10 mg Intravenous Q2H PRN Cox, Amy N, DO       dronabinol (MARINOL) capsule 2.5 mg  2.5 mg Oral BID Arta Bruce, MD   2.5 mg at 38/25/05 3976   folic acid (FOLVITE) tablet 1 mg  1 mg Oral Daily Sharen Hones, MD   1 mg at 12/24/20 7341   metroNIDAZOLE (FLAGYL) IVPB 500 mg  500 mg Intravenous Q12H Max Sane, MD   Stopped at 12/24/20 1054   multivitamin with minerals tablet 1 tablet  1 tablet Oral Daily Sharen Hones, MD   1 tablet at 12/24/20 0937   ondansetron (ZOFRAN) tablet 4 mg  4 mg Oral Q6H PRN Cox, Amy N, DO       Or   ondansetron (ZOFRAN) injection 4 mg  4 mg Intravenous Q6H PRN Cox, Amy N, DO       oxyCODONE-acetaminophen (PERCOCET/ROXICET) 5-325 MG per tablet 1 tablet  1 tablet Oral Q6H PRN Mansy, Jan A, MD   1 tablet at 12/24/20 0944   sodium chloride flush (NS) 0.9 % injection 10-40 mL  10-40 mL Intracatheter Q12H Manuella Ghazi, Vipul, MD   10 mL at 12/24/20 0948   sodium chloride flush (NS) 0.9 % injection 10-40 mL  10-40 mL Intracatheter PRN Max Sane, MD       sodium chloride  flush (NS) 0.9 % injection 5 mL  5 mL Intracatheter Q8H Criselda Peaches, MD   5 mL at 12/24/20 1446  thiamine tablet 100 mg  100 mg Oral Daily Sharen Hones, MD   100 mg at 12/24/20 0944   Facility-Administered Medications Ordered in Other Encounters  Medication Dose Route Frequency Provider Last Rate Last Admin   heparin lock flush 100 unit/mL  500 Units Intravenous Once Corcoran, Melissa C, MD       sodium chloride flush (NS) 0.9 % injection 10 mL  10 mL Intravenous PRN Lequita Asal, MD   10 mL at 11/11/19 6948     Abtx:  Anti-infectives (From admission, onward)    Start     Dose/Rate Route Frequency Ordered Stop   12/21/20 1200  metroNIDAZOLE (FLAGYL) IVPB 500 mg        500 mg 100 mL/hr over 60 Minutes Intravenous Every 12 hours 12/21/20 1023     12/19/20 0600  cefTRIAXone (ROCEPHIN) 2 g in sodium chloride 0.9 % 100 mL IVPB        2 g 200 mL/hr over 30 Minutes Intravenous Every 24 hours 12/18/20 0842     12/18/20 1800  vancomycin (VANCOREADY) IVPB 750 mg/150 mL  Status:  Discontinued        750 mg 150 mL/hr over 60 Minutes Intravenous Every 24 hours 12/17/20 1820 12/17/20 1830   12/18/20 1800  vancomycin (VANCOREADY) IVPB 750 mg/150 mL  Status:  Discontinued        750 mg 150 mL/hr over 60 Minutes Intravenous Every 24 hours 12/17/20 1830 12/18/20 0842   12/18/20 0600  ceFEPIme (MAXIPIME) 2 g in sodium chloride 0.9 % 100 mL IVPB  Status:  Discontinued        2 g 200 mL/hr over 30 Minutes Intravenous Every 12 hours 12/17/20 1544 12/17/20 1830   12/18/20 0600  ceFEPIme (MAXIPIME) 2 g in sodium chloride 0.9 % 100 mL IVPB        2 g 200 mL/hr over 30 Minutes Intravenous Every 12 hours 12/17/20 1830 12/18/20 1841   12/17/20 1700  vancomycin (VANCOREADY) IVPB 1500 mg/300 mL        1,500 mg 150 mL/hr over 120 Minutes Intravenous  Once 12/17/20 1544 12/17/20 2050   12/17/20 1600  metroNIDAZOLE (FLAGYL) IVPB 500 mg  Status:  Discontinued        500 mg 100 mL/hr over 60 Minutes  Intravenous Every 12 hours 12/17/20 1524 12/18/20 0842   12/17/20 1230  cefTRIAXone (ROCEPHIN) 2 g in sodium chloride 0.9 % 100 mL IVPB  Status:  Discontinued        2 g 200 mL/hr over 30 Minutes Intravenous Every 24 hours 12/17/20 1215 12/17/20 1543       REVIEW OF SYSTEMS:  Const: negative fever, negative chills,has weight loss, generalized weakness Eyes: negative diplopia or visual changes, negative eye pain ENT: negative coryza, negative sore throat Resp: Has cough, has dyspnea Cards: negative for chest pain, palpitations, lower extremity edema GU: negative for frequency, dysuria and hematuria GI: Poor appetite Skin: negative for rash and pruritus Heme: negative for easy bruising and gum/nose bleeding MS: Muscle weakness Neurolo: Headaches,  psych: negative for feelings of anxiety, depression  Endocrine: negative for thyroid, diabetes Allergy/Immunology-as above Objective:  VITALS:  BP 108/87 (BP Location: Left Arm)   Pulse 97   Temp 97.7 F (36.5 C) (Oral)   Resp 17   Ht 6' (1.829 m)   Wt 70.8 kg   SpO2 98%   BMI 21.17 kg/m  PHYSICAL EXAM:  General: Awake, chronically ill, weak Head: Normocephalic, without obvious abnormality, atraumatic.  Eyes: Conjunctivae clear, anicteric sclerae. Pupils are equal ENT Nares normal. No drainage or sinus tenderness. Lips, mucosa, and tongue normal. No Thrush Neck: Supple, symmetrical, no adenopathy, thyroid: non tender no carotid bruit and no JVD. Back: No CVA tenderness. Lungs: Bilateral air entry.  Decreased left base Port in place on the left side Heart: Irregular but rate controlled Abdomen: Soft, non-tender,not distended. Bowel sounds normal. No masses Extremities: atraumatic, no cyanosis. No edema. No clubbing Skin: No rashes or lesions. Or bruising Lymph: Cervical, supraclavicular normal. Neurologic: Grossly non-focal Pertinent Labs Lab Results CBC    Component Value Date/Time   WBC 14.8 (H) 12/24/2020 0506   RBC  2.87 (L) 12/24/2020 0506   HGB 10.2 (L) 12/24/2020 0506   HCT 28.4 (L) 12/24/2020 0506   PLT 133 (L) 12/24/2020 0506   MCV 99.0 12/24/2020 0506   MCH 35.5 (H) 12/24/2020 0506   MCHC 35.9 12/24/2020 0506   RDW 14.2 12/24/2020 0506   LYMPHSABS 0.9 12/24/2020 0506   MONOABS 1.6 (H) 12/24/2020 0506   EOSABS 0.0 12/24/2020 0506   BASOSABS 0.0 12/24/2020 0506    CMP Latest Ref Rng & Units 12/24/2020 12/23/2020 12/22/2020  Glucose 70 - 99 mg/dL 94 90 115(H)  BUN 8 - 23 mg/dL 14 17 21   Creatinine 0.61 - 1.24 mg/dL 0.66 0.69 0.59(L)  Sodium 135 - 145 mmol/L 142 141 141  Potassium 3.5 - 5.1 mmol/L 4.1 3.0(L) 3.5  Chloride 98 - 111 mmol/L 112(H) 109 110  CO2 22 - 32 mmol/L 23 24 23   Calcium 8.9 - 10.3 mg/dL 7.7(L) 7.6(L) 7.9(L)  Total Protein 6.5 - 8.1 g/dL 5.2(L) - -  Total Bilirubin 0.3 - 1.2 mg/dL 0.9 - -  Alkaline Phos 38 - 126 U/L 61 - -  AST 15 - 41 U/L 28 - -  ALT 0 - 44 U/L 78(H) - -      Microbiology: Recent Results (from the past 240 hour(s))  Blood Culture (routine x 2)     Status: Abnormal   Collection Time: 12/17/20 12:05 PM   Specimen: BLOOD  Result Value Ref Range Status   Specimen Description   Final    BLOOD LEFT CHEST PORT Performed at [facility], [address] [place], [place] [address]    Special Requests   Final    BOTTLES DRAWN AEROBIC AND ANAEROBIC Blood Culture adequate volume Performed at [facility], [address] [place], [place] [address]    Culture  Setup Time   Final    GRAM NEGATIVE RODS IN BOTH AEROBIC AND ANAEROBIC BOTTLES CRITICAL VALUE NOTED.  VALUE IS CONSISTENT WITH PREVIOUSLY REPORTED AND CALLED VALUE. Performed at [facility], [address] [place], [place] [address]    Culture (A)  Final    KLEBSIELLA PNEUMONIAE SUSCEPTIBILITIES PERFORMED ON PREVIOUS CULTURE WITHIN THE LAST 5 DAYS. Performed at [facility] [place] [facility], [place] [address] [place], [place] [address]    Report Status 12/20/2020 FINAL   Final  Blood Culture (routine x 2)     Status: Abnormal   Collection Time: 12/17/20 12:05 PM   Specimen: BLOOD  Result Value Ref Range Status   Specimen Description   Final    BLOOD LEFT CHEST Performed at [facility], [address], [place], [place] [address]    Special Requests   Final    BOTTLES DRAWN AEROBIC AND ANAEROBIC Blood Culture adequate volume Performed at [facility], [address] [place], [place] [address]  Culture  Setup Time   Final    GRAM NEGATIVE RODS IN BOTH AEROBIC AND ANAEROBIC BOTTLES CRITICAL RESULT CALLED TO, READ BACK BY AND VERIFIED WITH: [place] [phone] 12/18/20 HNM Performed at [facility], [address] [place], [place] [address]    Culture KLEBSIELLA PNEUMONIAE (A)  Final   Report Status 12/20/2020 FINAL  Final   Organism ID, Bacteria KLEBSIELLA PNEUMONIAE  Final      Susceptibility   Klebsiella pneumoniae - MIC*    AMPICILLIN >=32 RESISTANT Resistant     CEFAZOLIN <=4 SENSITIVE Sensitive     CEFEPIME <=0.12 SENSITIVE Sensitive     CEFTAZIDIME <=1 SENSITIVE Sensitive     CEFTRIAXONE <=0.25 SENSITIVE Sensitive     CIPROFLOXACIN <=0.25 SENSITIVE Sensitive     GENTAMICIN <=1 SENSITIVE Sensitive     IMIPENEM <=0.25 SENSITIVE Sensitive     TRIMETH/SULFA <=20 SENSITIVE Sensitive     AMPICILLIN/SULBACTAM 4 SENSITIVE Sensitive     PIP/TAZO <=4 SENSITIVE Sensitive     * KLEBSIELLA PNEUMONIAE  Blood Culture ID Panel (Reflexed)     Status: Abnormal   Collection Time: 12/17/20 12:05 PM  Result Value Ref Range Status   Enterococcus faecalis NOT DETECTED NOT DETECTED Final   Enterococcus Faecium NOT DETECTED NOT DETECTED Final   Listeria monocytogenes NOT DETECTED NOT DETECTED Final   Staphylococcus species NOT DETECTED NOT DETECTED Final   Staphylococcus aureus (BCID) NOT DETECTED NOT DETECTED Final   Staphylococcus epidermidis NOT DETECTED NOT DETECTED Final   Staphylococcus lugdunensis NOT DETECTED NOT DETECTED  Final   Streptococcus species NOT DETECTED NOT DETECTED Final   Streptococcus agalactiae NOT DETECTED NOT DETECTED Final   Streptococcus pneumoniae NOT DETECTED NOT DETECTED Final   Streptococcus pyogenes NOT DETECTED NOT DETECTED Final   A.calcoaceticus-baumannii NOT DETECTED NOT DETECTED Final   Bacteroides fragilis NOT DETECTED NOT DETECTED Final   Enterobacterales DETECTED (A) NOT DETECTED Final    Comment: Enterobacterales represent a large order of gram negative bacteria, not a single organism. CRITICAL RESULT CALLED TO, READ BACK BY AND VERIFIED WITH: [place] RN [phone] 12/18/20 HNM    Enterobacter cloacae complex NOT DETECTED NOT DETECTED Final   Escherichia coli NOT DETECTED NOT DETECTED Final   Klebsiella aerogenes NOT DETECTED NOT DETECTED Final   Klebsiella oxytoca NOT DETECTED NOT DETECTED Final   Klebsiella pneumoniae DETECTED (A) NOT DETECTED Final    Comment: CRITICAL RESULT CALLED TO, READ BACK BY AND VERIFIED WITH: [place] RN [phone] 12/18/20 HNM    Proteus species NOT DETECTED NOT DETECTED Final   Salmonella species NOT DETECTED NOT DETECTED Final   Serratia marcescens NOT DETECTED NOT DETECTED Final   Haemophilus influenzae NOT DETECTED NOT DETECTED Final   Neisseria meningitidis NOT DETECTED NOT DETECTED Final   Pseudomonas aeruginosa NOT DETECTED NOT DETECTED Final   Stenotrophomonas maltophilia NOT DETECTED NOT DETECTED Final   Candida albicans NOT DETECTED NOT DETECTED Final   Candida auris NOT DETECTED NOT DETECTED Final   Candida glabrata NOT DETECTED NOT DETECTED Final   Candida krusei NOT DETECTED NOT DETECTED Final   Candida parapsilosis NOT DETECTED NOT DETECTED Final   Candida tropicalis NOT DETECTED NOT DETECTED Final   Cryptococcus neoformans/gattii NOT DETECTED NOT DETECTED Final   CTX-M ESBL NOT DETECTED NOT DETECTED Final   Carbapenem resistance IMP NOT DETECTED NOT DETECTED Final   Carbapenem resistance KPC NOT DETECTED NOT DETECTED Final    Carbapenem resistance NDM NOT DETECTED NOT DETECTED Final  Carbapenem resist OXA 48 LIKE NOT DETECTED NOT DETECTED Final   Carbapenem resistance VIM NOT DETECTED NOT DETECTED Final    Comment: Performed at [facility], [address] [place], [place] [address]  Resp Panel by RT-PCR (Flu A&B, Covid) Nasopharyngeal Swab     Status: None   Collection Time: 12/17/20 12:37 PM   Specimen: Nasopharyngeal Swab; Nasopharyngeal(NP) swabs in vial transport medium  Result Value Ref Range Status   SARS Coronavirus 2 by RT PCR NEGATIVE NEGATIVE Final    Comment: (NOTE) SARS-CoV-2 target nucleic acids are NOT DETECTED.  The SARS-CoV-2 RNA is generally detectable in upper respiratory specimens during the acute phase of infection. The lowest concentration of SARS-CoV-2 viral copies this assay can detect is 138 copies/mL. A negative result does not preclude SARS-Cov-2 infection and should not be used as the sole basis for treatment or other patient management decisions. A negative result may occur with  improper specimen collection/handling, submission of specimen other than nasopharyngeal swab, presence of viral mutation(s) within the areas targeted by this assay, and inadequate number of viral copies(<138 copies/mL). A negative result must be combined with clinical observations, patient history, and epidemiological information. The expected result is Negative.  Fact Sheet for Patients:  EntrepreneurPulse.com.au  Fact Sheet for Healthcare Providers:  IncredibleEmployment.be  This test is no t yet approved or cleared by the [place] FDA and  has been authorized for detection and/or diagnosis of SARS-CoV-2 by FDA under an Emergency Use Authorization (EUA). This EUA will remain  in effect (meaning this test can be used) for the duration of the COVID-19 declaration under Section 564(b)(1) of the Act, 21 U.S.C.section 360bbb-3(b)(1), unless the  authorization is terminated  or revoked sooner.       Influenza A by PCR NEGATIVE NEGATIVE Final   Influenza B by PCR NEGATIVE NEGATIVE Final    Comment: (NOTE) The Xpert Xpress SARS-CoV-2/FLU/RSV plus assay is intended as an aid in the diagnosis of influenza from Nasopharyngeal swab specimens and should not be used as a sole basis for treatment. Nasal washings and aspirates are unacceptable for Xpert Xpress SARS-CoV-2/FLU/RSV testing.  Fact Sheet for Patients: EntrepreneurPulse.com.au  Fact Sheet for Healthcare Providers: IncredibleEmployment.be  This test is not yet approved or cleared by the [place] FDA and has been authorized for detection and/or diagnosis of SARS-CoV-2 by FDA under an Emergency Use Authorization (EUA). This EUA will remain in effect (meaning this test can be used) for the duration of the COVID-19 declaration under Section 564(b)(1) of the Act, 21 U.S.C. section 360bbb-3(b)(1), unless the authorization is terminated or revoked.  Performed at [facility], [address] [place], [place] [address]   Urine Culture     Status: None   Collection Time: 12/17/20  3:32 PM   Specimen: In/Out Cath Urine  Result Value Ref Range Status   Specimen Description   Final    IN/OUT CATH URINE Performed at [facility], [address], [place], [place] [address]    Special Requests   Final    NONE Performed at [facility], [address], [place], [place] [address]    Culture   Final    NO GROWTH Performed at [facility], [place] [address], [place], [place] [address]    Report Status 12/19/2020 FINAL  Final  C Difficile Quick Screen w PCR reflex     Status: None   Collection Time: 12/17/20  3:32 PM   Specimen: STOOL  Result  Value Ref Range Status   C Diff antigen NEGATIVE NEGATIVE Final   C Diff toxin NEGATIVE NEGATIVE Final   C Diff interpretation No C. difficile detected.  Final     Comment: Performed at [facility], [address] [place], [place] [address]  MRSA Next Gen by PCR, Nasal     Status: Abnormal   Collection Time: 12/18/20  9:55 PM   Specimen: Nasal Mucosa; Nasal Swab  Result Value Ref Range Status   MRSA by PCR Next Gen DETECTED (A) NOT DETECTED Final    Comment: RESULT CALLED TO, READ BACK BY AND VERIFIED WITH: BETH BUONO @ 2327 12/18/20 LFD (NOTE) The GeneXpert MRSA Assay (FDA approved for NASAL specimens only), is one component of a comprehensive MRSA colonization surveillance program. It is not intended to diagnose MRSA infection nor to guide or monitor treatment for MRSA infections. Test performance is not FDA approved in patients less than 70 years old. Performed at [facility], [address] [place], [place] [address]   CULTURE, BLOOD (ROUTINE X 2) w Reflex to ID Panel     Status: None (Preliminary result)   Collection Time: 12/22/20  2:14 PM   Specimen: BLOOD  Result Value Ref Range Status   Specimen Description BLOOD LEFT ANTECUBITAL  Final   Special Requests   Final    BOTTLES DRAWN AEROBIC AND ANAEROBIC Blood Culture adequate volume   Culture   Final    NO GROWTH 2 DAYS Performed at [facility], [address], [place], [place] [address]    Report Status PENDING  Incomplete  CULTURE, BLOOD (ROUTINE X 2) w Reflex to ID Panel     Status: None (Preliminary result)   Collection Time: 12/22/20  2:15 PM   Specimen: BLOOD  Result Value Ref Range Status   Specimen Description BLOOD RIGHT ANTECUBITAL  Final   Special Requests   Final    BOTTLES DRAWN AEROBIC AND ANAEROBIC Blood Culture adequate volume   Culture   Final    NO GROWTH 2 DAYS Performed at [facility], [address] [place],  [address]    Report Status PENDING  Incomplete    IMAGING RESULTS: I have personally reviewed the films See above  ? Impression/Recommendation Klebsiella bacteremia with sepsis secondary to  neutropenia. Recent chemotherapy causing neutropenia. -The source of the Klebsiella could be the gallbladder as he has acute cholecystitis.  Underwent cholecystostomy.  No culture was sent.  Patient currently on ceftriaxone Do not suspect port infection currently  Multiple acute infarcts from emboli- likely ventricular in origin because of afib and not being on eliquis  Doubt endocarditis If repeat blood culture comes positive then he will need TEE otherwise we can give 10 days of ceftriaxone  Metastatic lung carcinoma. On chemo Thrombocytopenia and neutropenia due to chemo has resolved  ID will follow him peripherally this weekend- call if needed    ? ? ___________________________________________________ Discussed with patient, requesting provider Note:  This document was prepared using Dragon voice recognition software and may include unintentional dictation errors.

## 2020-12-24 NOTE — Evaluation (Signed)
Clinical/Bedside Swallow Evaluation Patient Details  Name: Scott Jordan MRN: [id number] Date of Birth: 02-17-1951  Today's Date: 12/24/2020 Time: SLP Start Time (ACUTE ONLY): 41 SLP Stop Time (ACUTE ONLY): 1330 SLP Time Calculation (min) (ACUTE ONLY): 60 min  Past Medical History:  Past Medical History:  Diagnosis Date   A-fib ([place]) 01/10/2019   pt st this was dx by Dr. Ubaldo Glassing   Cancer [facility]) [id number]   LUNG   Complication of anesthesia    DIFFICULTY WAKING UP AFTER SURGERY- 62 YRS AGO   Hypertension    Lung cancer ([place])    Substance abuse ([place])    Past Surgical History:  Past Surgical History:  Procedure Laterality Date   ARM DEBRIDEMENT Left    INCISION AND DEBRIDEMENT LOWER ARM -20 YRS AGO   BACK SURGERY     IR PERC CHOLECYSTOSTOMY  12/20/2020   PORTACATH PLACEMENT Left 08/29/2019   Procedure: INSERTION PORT-A-CATH;  Surgeon: Nestor Lewandowsky, MD;  Location: [facility] ORS;  Service: General;  Laterality: Left;   HPI:  Pt is a 70 year old male with PMH of 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 fib not on AC, chronic hep C, chronic cancer pain, HTN, alcohol abuse, malnutrition and polysubstance who is admitted with neutropenic sepsis due to acute cholecystitis and Klebsiella bacteremia now s/p cholecystostomy on 9/11 by IR. Hospital admission complicated by acute thromboembolic stroke with new right-sided weakness-confirmed on MRI brain on 12/20/2020.  MRI: Multiple small areas of restricted diffusion in the bilateral  frontal and occipital and left parietal white matter, left  perirolandic cortex, left thalamocapsular region, and left  cerebellum, which are concerning for acute infarcts.  CXR at admit: Chronic left basilar scarring or atelectasis. No new or acute  findings.  Pt is followed by Palliative Care at [facility].    Assessment / Plan / Recommendation  Clinical Impression  Pt appears to present w/ oropharyngeal phase dysphagia impacted by  lacking Dentition for effective mastication of solids as well as overt coughing w/ thin liquids. In light of recently dx'd CVA this admit, and impact from pt's chronic illness and ETOH abuse, recommend objective swallow assessment to determine degree of any pharyngeal phase dysphagia. Also, unsure of pt's Baseline Cognitive functioning and its impact on pt's swallowing function. Pt appears at increased risk for aspiration thus Pulmonary decline d/t chronic illness; pt also has severe malnutrition per Dietician and has suggested that PEG placement would need to be considered to meet pt's nutritional needs if continuing to persue aggressive Oncology care.  Pt consumed po trials w/ episode of overt coughing w/ thin liquids; no overt clinical s/s of aspiration noted during po trials of Nectar liquids, puree, and soft solids. Pt appears at reduced risk for aspiration following general aspiration precautions and a modified consistency diet. During the oral phase, pt demonstrated timely bolus management and control of bolus propulsion for A-P transfer for swallowing w/ liquids and purees; however, much increased mastication time/effort for increased textured foods d/t missing Dentition. Pt expectorated meats boluses. OM Exam appeared [facility] w/ no unilateral weakness noted. Speech Clear. Pt fed self w/ Mod setup support -- noted weakness in RUE during grasping utensil.   Recommend a Mech Soft consistency diet w/ MINCED meats, moistened foods; Nectar liquids at this time. Recommend general aspiration precautions, Pills WHOLE vs CRUSHED in Puree for safer, easier swallowing. Tray setup at meals, positioning. Education given on Pills in Puree; food consistencies and easy to eat options;  general aspiration precautions; and MBSS to follow tomorrow. Pt agreed. SLP Visit Diagnosis: Dysphagia, oropharyngeal phase (R13.12)    Aspiration Risk  Mild aspiration risk;Moderate aspiration risk;Risk for inadequate nutrition/hydration     Diet Recommendation   Mech Soft consistency diet w/ MINCED meats, moistened foods; Nectar liquids at this time. Recommend general aspiration precautions, Tray setup at meals, positioning Upright support for po's  Medication Administration: Whole meds with puree (vs Crushed in puree if needed)    Other  Recommendations Recommended Consults:  (Dietician f/u; Palliative Care following) Oral Care Recommendations: Oral care BID;Oral care before and after PO;Staff/trained caregiver to provide oral care Other Recommendations: Order thickener from pharmacy;Prohibited food (jello, ice cream, thin soups);Remove water pitcher;Have oral suction available    Recommendations for follow up therapy are one component of a multi-disciplinary discharge planning process, led by the attending physician.  Recommendations may be updated based on patient status, additional functional criteria and insurance authorization.  Follow up Recommendations  (TBD)      Frequency and Duration min 3x week  2 weeks       Prognosis Prognosis for Safe Diet Advancement: Fair Barriers to Reach Goals: Motivation;Time post onset;Severity of deficits;Behavior      Swallow Study   General Date of Onset: 12/17/20 HPI: Pt is a 70 year old male with PMH of 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 fib not on AC, chronic hep C, chronic cancer pain, HTN, alcohol abuse, malnutrition and polysubstance who is admitted with neutropenic sepsis due to acute cholecystitis and Klebsiella bacteremia now s/p cholecystostomy on 9/11 by IR. Hospital admission complicated by acute thromboembolic stroke with new right-sided weakness-confirmed on MRI brain on 12/20/2020.  MRI: Multiple small areas of restricted diffusion in the bilateral  frontal and occipital and left parietal white matter, left  perirolandic cortex, left thalamocapsular region, and left  cerebellum, which are concerning for acute infarcts.   CXR at admit: Chronic left basilar scarring or atelectasis. No new or acute  findings.  Pt is followed by Palliative Care at [facility]. Type of Study: Bedside Swallow Evaluation Previous Swallow Assessment: none Diet Prior to this Study: Regular;Nectar-thick liquids (concern for thin liquids) Temperature Spikes Noted: No (wbc 14.8) Respiratory Status: Room air History of Recent Intubation: No Behavior/Cognition: Alert;Cooperative;Pleasant mood;Confused;Distractible;Requires cueing Oral Cavity Assessment: Within Functional Limits Oral Care Completed by SLP: Recent completion by staff Oral Cavity - Dentition: Missing dentition Vision: Functional for self-feeding Self-Feeding Abilities: Able to feed self;Needs assist;Needs set up Patient Positioning: Upright in bed Baseline Vocal Quality: Normal Volitional Cough: Strong Volitional Swallow: Able to elicit    Oral/Motor/Sensory Function Overall Oral Motor/Sensory Function: Within functional limits -- WFL  Ice Chips Ice chips: Within functional limits Presentation: Cup;Spoon (fed; 3 trials)   Thin Liquid Thin Liquid: Impaired Presentation: Cup;Self Fed (3 trials) Pharyngeal  Phase Impairments: Suspected delayed Swallow;Cough - Immediate    Nectar Thick Nectar Thick Liquid: Within functional limits Presentation: Cup;Self Fed (4 trials)   Honey Thick Honey Thick Liquid: Not tested   Puree Puree: Within functional limits Presentation: Self Fed;Spoon (3-4 trials - mahsed potatoes, magic cup)   Solid     Solid: Impaired Presentation: Self Fed;Spoon (7 trials - carrots, meat) Oral Phase Impairments: Impaired mastication (missing dentition) Oral Phase Functional Implications: Impaired mastication Pharyngeal Phase Impairments:  (none)        Orinda Kenner, MS, [employer] Rehab Services [phone] [facility] 12/24/2020,4:31 PM

## 2020-12-24 NOTE — Progress Notes (Signed)
Occupational Therapy Treatment Patient Details Name: Scott Jordan MRN: [id number] DOB: 02-27-1951 Today's Date: 12/24/2020   History of present illness Pt is a 70 y.o. M arriving to ED for c/o nausea, vomiting and admitted for neutropenic sepsis,severe neutropenia, cholecystitis. PMH includes stage IV small cell carcinoma, anemia, thrombocytopenia, hx of substance abuse, chronic hepatitis C, HTN., a-fib. During hospitalization has been complicated by acute CVA with residual right-sided weakness. During hospitalization multifocal acute embolic strokes occured in bilateral cerebral hemisphere & L cerebellum.   OT comments  Scott Jordan received this AM, supine and covered in feces. He had had diarrhea in bed and had attempted to clean himself and had ended up smearing feces on upper and lower body, hands, under fingernails, bed rails, bedding, clothing, floor. Required Mod A for bed mobility (rolling, scooting), Mod A for changing gown, Max A for bathing, grooming. Pt endorses 8/10 pain in L hand and R arm, numbness if R LE, appears fatigued and depressed throughout session. Declines to attempt EOB sitting, stating he is too tired and in too much pain. Counseled pt in importance of OOB activity; he states he would like to try transferring to recliner during next therapy session. Pt c/o feeling SOB. SpO2 89%, pulse 127. Nurse notified. Will continue to follow POC.   Recommendations for follow up therapy are one component of a multi-disciplinary discharge planning process, led by the attending physician.  Recommendations may be updated based on patient status, additional functional criteria and insurance authorization.    Follow Up Recommendations  SNF    Equipment Recommendations  None recommended by OT    Recommendations for Other Services      Precautions / Restrictions Precautions Precautions: Fall Restrictions Weight Bearing Restrictions: No       Mobility Bed Mobility Overal bed mobility:  Needs Assistance Bed Mobility: Rolling Rolling: Mod assist         General bed mobility comments: Mod A for scooting towards HOB from supine    Transfers                 General transfer comment: deferred    Balance       Sitting balance - Comments: Pt supine throughout session, declines sitting/OOB mobility                                   ADL either performed or assessed with clinical judgement   ADL Overall ADL's : Needs assistance/impaired Eating/Feeding: Set up;Minimal assistance   Grooming: Wash/dry hands;Wash/dry face;Moderate assistance;Bed level Grooming Details (indicate cue type and reason): requires verbal cues, repeated instructions, and, ultimately, Mod A to thoroughly was feces-covered hands Upper Body Bathing: Maximal assistance;Bed level   Lower Body Bathing: Maximal assistance   Upper Body Dressing : Moderate assistance;Bed level Upper Body Dressing Details (indicate cue type and reason): to don, doff gown                   General ADL Comments: Mod-Max A for grooming, bathing at bed level, Mod A for dressing     Vision       Perception     Praxis      Cognition Arousal/Alertness: Awake/alert;Lethargic Behavior During Therapy: WFL for tasks assessed/performed Overall Cognitive Status: No family/caregiver present to determine baseline cognitive functioning  General Comments: fatigued, lethargic, voices discouragement        Exercises Other Exercises Other Exercises: bed mobility, grooming, bathing, dressing   Shoulder Instructions       General Comments      Pertinent Vitals/ Pain       Pain Assessment: 0-10 Pain Score: 8  Pain Location: R arm, L hand Pain Intervention(s): Monitored during session;Limited activity within patient's tolerance;Repositioned  Home Living                                          Prior  Functioning/Environment              Frequency  Min 1X/week        Progress Toward Goals  OT Goals(current goals can now be found in the care plan section)  Progress towards OT goals: Progressing toward goals  Acute Rehab OT Goals Patient Stated Goal: to feel better OT Goal Formulation: With patient Time For Goal Achievement: 01/03/21 Potential to Achieve Goals: Good  Plan Discharge plan remains appropriate;Frequency remains appropriate    Co-evaluation                 AM-PAC OT "6 Clicks" Daily Activity     Outcome Measure   Help from another person eating meals?: A Little Help from another person taking care of personal grooming?: A Lot Help from another person toileting, which includes using toliet, bedpan, or urinal?: A Lot Help from another person bathing (including washing, rinsing, drying)?: A Lot Help from another person to put on and taking off regular upper body clothing?: A Lot Help from another person to put on and taking off regular lower body clothing?: A Lot 6 Click Score: 13    End of Session    OT Visit Diagnosis: Unsteadiness on feet (R26.81);Muscle weakness (generalized) (M62.81);Pain Pain - Right/Left: Right Pain - part of body: Arm;Hand   Activity Tolerance Patient limited by fatigue;Patient limited by pain   Patient Left in bed;with call bell/phone within reach;with bed alarm set;with nursing/sitter in room   Nurse Communication Other (comment) (Pt feeling SOB, SpO2 89%, pulse 127)        Time: 3716-9678 OT Time Calculation (min): 26 min  Charges: OT General Charges $OT Visit: 1 Visit OT Treatments $Self Care/Home Management : 23-37 mins  Josiah Lobo, PhD, MS, OTR/L 12/24/20, 10:01 AM

## 2020-12-24 NOTE — Plan of Care (Signed)
  Problem: Fluid Volume: Goal: Hemodynamic stability will improve Outcome: Progressing   Problem: Clinical Measurements: Goal: Diagnostic test results will improve Outcome: Progressing Goal: Signs and symptoms of infection will decrease Outcome: Progressing   Problem: Respiratory: Goal: Ability to maintain adequate ventilation will improve Outcome: Not Progressing   Problem: Education: Goal: Knowledge of General Education information will improve Description: Including pain rating scale, medication(s)/side effects and non-pharmacologic comfort measures Outcome: Progressing   Problem: Health Behavior/Discharge Planning: Goal: Ability to manage health-related needs will improve Outcome: Progressing   Problem: Clinical Measurements: Goal: Ability to maintain clinical measurements within normal limits will improve Outcome: Progressing Goal: Will remain free from infection Outcome: Progressing Goal: Diagnostic test results will improve Outcome: Progressing Goal: Respiratory complications will improve Outcome: Progressing Goal: Cardiovascular complication will be avoided Outcome: Progressing   Problem: Activity: Goal: Risk for activity intolerance will decrease Outcome: Progressing   Problem: Nutrition: Goal: Adequate nutrition will be maintained Outcome: Not Progressing   Problem: Coping: Goal: Level of anxiety will decrease Outcome: Progressing   Problem: Elimination: Goal: Will not experience complications related to bowel motility Outcome: Progressing Goal: Will not experience complications related to urinary retention Outcome: Progressing   Problem: Pain Managment: Goal: General experience of comfort will improve Outcome: Progressing   Problem: Safety: Goal: Ability to remain free from injury will improve Outcome: Progressing   Problem: Skin Integrity: Goal: Risk for impaired skin integrity will decrease Outcome: Progressing

## 2020-12-24 NOTE — Progress Notes (Addendum)
Patients POA [name] advised RN that patient is agreeable to obtaining a feeding tube and going to a facility upon discharge.  Sent MD Tasia Catchings a secure message informing of same.   [id number]: received reply from MD Tasia Catchings to inform hospitalist of patient's decision.  Sent secure chat message to MD Cyndia Skeeters.

## 2020-12-25 ENCOUNTER — Encounter: Payer: Self-pay | Admitting: Oncology

## 2020-12-25 ENCOUNTER — Inpatient Hospital Stay: Payer: Medicare Other

## 2020-12-25 DIAGNOSIS — F1010 Alcohol abuse, uncomplicated: Secondary | ICD-10-CM | POA: Diagnosis not present

## 2020-12-25 DIAGNOSIS — A419 Sepsis, unspecified organism: Secondary | ICD-10-CM | POA: Diagnosis not present

## 2020-12-25 DIAGNOSIS — I6310 Cerebral infarction due to embolism of unspecified precerebral artery: Secondary | ICD-10-CM | POA: Diagnosis not present

## 2020-12-25 DIAGNOSIS — C3490 Malignant neoplasm of unspecified part of unspecified bronchus or lung: Secondary | ICD-10-CM | POA: Diagnosis not present

## 2020-12-25 DIAGNOSIS — I484 Atypical atrial flutter: Secondary | ICD-10-CM | POA: Diagnosis not present

## 2020-12-25 DIAGNOSIS — K819 Cholecystitis, unspecified: Secondary | ICD-10-CM | POA: Diagnosis not present

## 2020-12-25 DIAGNOSIS — D6481 Anemia due to antineoplastic chemotherapy: Secondary | ICD-10-CM | POA: Diagnosis not present

## 2020-12-25 LAB — APTT: aPTT: 30 seconds (ref 24–36)

## 2020-12-25 LAB — CBC WITH DIFFERENTIAL/PLATELET
Abs Immature Granulocytes: 2.74 10*3/uL — ABNORMAL HIGH (ref 0.00–0.07)
Basophils Absolute: 0 10*3/uL (ref 0.0–0.1)
Basophils Relative: 0 %
Eosinophils Absolute: 0 10*3/uL (ref 0.0–0.5)
Eosinophils Relative: 0 %
HCT: 29.3 % — ABNORMAL LOW (ref 39.0–52.0)
Hemoglobin: 10.1 g/dL — ABNORMAL LOW (ref 13.0–17.0)
Immature Granulocytes: 16 %
Lymphocytes Relative: 7 %
Lymphs Abs: 1.2 10*3/uL (ref 0.7–4.0)
MCH: 33.7 pg (ref 26.0–34.0)
MCHC: 34.5 g/dL (ref 30.0–36.0)
MCV: 97.7 fL (ref 80.0–100.0)
Monocytes Absolute: 1.9 10*3/uL — ABNORMAL HIGH (ref 0.1–1.0)
Monocytes Relative: 11 %
Neutro Abs: 11.4 10*3/uL — ABNORMAL HIGH (ref 1.7–7.7)
Neutrophils Relative %: 66 %
Platelets: 198 10*3/uL (ref 150–400)
RBC: 3 MIL/uL — ABNORMAL LOW (ref 4.22–5.81)
RDW: 14.2 % (ref 11.5–15.5)
Smear Review: NORMAL
WBC: 17.2 10*3/uL — ABNORMAL HIGH (ref 4.0–10.5)
nRBC: 0.1 % (ref 0.0–0.2)

## 2020-12-25 LAB — BODY FLUID CELL COUNT WITH DIFFERENTIAL
Eos, Fluid: 0 %
Lymphs, Fluid: 41 %
Monocyte-Macrophage-Serous Fluid: 18 %
Neutrophil Count, Fluid: 41 %
Total Nucleated Cell Count, Fluid: 1563 cu mm

## 2020-12-25 LAB — COMPREHENSIVE METABOLIC PANEL
ALT: 63 U/L — ABNORMAL HIGH (ref 0–44)
AST: 23 U/L (ref 15–41)
Albumin: 2 g/dL — ABNORMAL LOW (ref 3.5–5.0)
Alkaline Phosphatase: 60 U/L (ref 38–126)
Anion gap: 5 (ref 5–15)
BUN: 14 mg/dL (ref 8–23)
CO2: 24 mmol/L (ref 22–32)
Calcium: 7.7 mg/dL — ABNORMAL LOW (ref 8.9–10.3)
Chloride: 112 mmol/L — ABNORMAL HIGH (ref 98–111)
Creatinine, Ser: 0.71 mg/dL (ref 0.61–1.24)
GFR, Estimated: >60 mL/min (ref >60)
Glucose, Bld: 105 mg/dL — ABNORMAL HIGH (ref 70–99)
Potassium: 3.5 mmol/L (ref 3.5–5.1)
Sodium: 141 mmol/L (ref 135–145)
Total Bilirubin: 1 mg/dL (ref 0.3–1.2)
Total Protein: 5.4 g/dL — ABNORMAL LOW (ref 6.5–8.1)

## 2020-12-25 LAB — GLUCOSE, PLEURAL OR PERITONEAL FLUID: Glucose, Fluid: 140 mg/dL

## 2020-12-25 LAB — HEPARIN LEVEL (UNFRACTIONATED): Heparin Unfractionated: 0.82 IU/mL — ABNORMAL HIGH (ref 0.30–0.70)

## 2020-12-25 LAB — MAGNESIUM: Magnesium: 1.6 mg/dL — ABNORMAL LOW (ref 1.7–2.4)

## 2020-12-25 LAB — PHOSPHORUS: Phosphorus: 2.8 mg/dL (ref 2.5–4.6)

## 2020-12-25 LAB — CK: Total CK: 62 U/L (ref 49–397)

## 2020-12-25 MED ORDER — GUAIFENESIN ER 600 MG PO TB12: 1200.0000 mg | ORAL_TABLET | Freq: Two times a day (BID) | ORAL | Status: DC

## 2020-12-25 MED ORDER — HEPARIN (PORCINE) 25000 UT/250ML-% IV SOLN
1000.0000 [IU]/h | INTRAVENOUS | Status: DC
  Administered 2020-12-25: 1000 [IU]/h via INTRAVENOUS
  Filled 2020-12-25: qty 250

## 2020-12-25 MED ORDER — MAGNESIUM SULFATE 2 GM/50ML IV SOLN
2.0000 g | Freq: Once | INTRAVENOUS | Status: AC
  Administered 2020-12-25: 2 g via INTRAVENOUS
  Filled 2020-12-25: qty 50

## 2020-12-25 MED ORDER — DILTIAZEM HCL 30 MG PO TABS
30.0000 mg | ORAL_TABLET | Freq: Four times a day (QID) | ORAL | Status: DC | PRN
  Filled 2020-12-25: qty 1

## 2020-12-25 MED ORDER — HEPARIN (PORCINE) 25000 UT/250ML-% IV SOLN
2150.0000 [IU]/h | INTRAVENOUS | Status: DC
  Administered 2020-12-25: 1000 [IU]/h via INTRAVENOUS
  Administered 2020-12-26: 1250 [IU]/h via INTRAVENOUS
  Administered 2020-12-27: 1500 [IU]/h via INTRAVENOUS
  Administered 2020-12-28: 1700 [IU]/h via INTRAVENOUS
  Administered 2020-12-28: 1800 [IU]/h via INTRAVENOUS
  Administered 2020-12-29: 2000 [IU]/h via INTRAVENOUS
  Administered 2020-12-30 – 2021-01-01 (×5): 2150 [IU]/h via INTRAVENOUS
  Filled 2020-12-25 (×11): qty 250

## 2020-12-25 MED ORDER — GUAIFENESIN ER 600 MG PO TB12
1200.0000 mg | ORAL_TABLET | Freq: Two times a day (BID) | ORAL | Status: DC
  Administered 2020-12-25 – 2021-01-04 (×19): 1200 mg via ORAL
  Filled 2020-12-25 (×21): qty 2

## 2020-12-25 NOTE — Consult Note (Signed)
Pulmonary Medicine          Date: 12/25/2020,   MRN# [id number] Scott Jordan [birth year]     AdmissionWeight: 61.7 kg                 CurrentWeight: 70.8 kg  Referring physician: Dr Cyndia Skeeters    CHIEF COMPLAINT:   Acute on chronic hypoxemic respiratory failure   HISTORY OF PRESENT ILLNESS   This is a pleasant male with hx of lung cancer, stage 4 small cell ca, substance abuse, chronic HCV, bony metastasis who is noted to have abnormal chest imaging with complete white out of left lung.    He appears weak and is severely dyspenic during my evaluation bu able to speak slowly.  PCCM consultation for evaluation and management. Pictorial documentation of tis thoracic imaging is included below showing large pleural effusion with associated complete left atelcetasis.   PAST MEDICAL HISTORY   Past Medical History:  Diagnosis Date  . A-fib ([place]) 01/10/2019   pt st this was dx by Dr. Ubaldo Glassing  . Cancer ([place]) 2021   LUNG  . Complication of anesthesia    DIFFICULTY WAKING UP AFTER SURGERY- 20 YRS AGO  . Hypertension   . Lung cancer ([place])   . Substance abuse ([place])      SURGICAL HISTORY   Past Surgical History:  Procedure Laterality Date  . ARM DEBRIDEMENT Left    INCISION AND DEBRIDEMENT LOWER ARM -20 YRS AGO  . BACK SURGERY    . IR PERC CHOLECYSTOSTOMY  12/20/2020  . PORTACATH PLACEMENT Left 08/29/2019   Procedure: INSERTION PORT-A-CATH;  Surgeon: Nestor Lewandowsky, MD;  Location: [facility] ORS;  Service: General;  Laterality: Left;     FAMILY HISTORY   History reviewed. No pertinent family history.   SOCIAL HISTORY   Social History   Tobacco Use  . Smoking status: Former    Packs/day: 0.50    Types: Cigarettes  . Smokeless tobacco: Never  . Tobacco comments:    not smoked since 6 weeks ago  Vaping Use  . Vaping Use: Never used  Substance Use Topics  . Alcohol use: Yes    Comment: 84 OZ IN WEEK  . Drug use: Not Currently    Types: Heroin    Comment: heroin  WITHIN PAST YEAR     MEDICATIONS    Home Medication:    Current Medication:  Current Facility-Administered Medications:  .  0.9 %  sodium chloride infusion (Manually program via Guardrails IV Fluids), , Intravenous, Once, Sharen Hones, MD .  0.9 %  sodium chloride infusion, , Intravenous, PRN, Sharen Hones, MD, Last Rate: 10 mL/hr at 12/25/20 0318, Infusion Verify at 12/25/20 0318 .  0.9 %  sodium chloride infusion, , , Continuous PRN, Criselda Peaches, MD, Last Rate: 10 mL/hr at 12/20/20 1220, 10 mL/hr at 12/20/20 1220 .  acetaminophen (TYLENOL) tablet 650 mg, 650 mg, Oral, Q6H PRN **OR** acetaminophen (TYLENOL) suppository 650 mg, 650 mg, Rectal, Q6H PRN, Cox, Amy N, DO .  cefTRIAXone (ROCEPHIN) 2 g in sodium chloride 0.9 % 100 mL IVPB, 2 g, Intravenous, Q24H, Sharen Hones, MD, Last Rate: 200 mL/hr at 12/25/20 0551, 2 g at 12/25/20 0551 .  chlorhexidine (PERIDEX) 0.12 % solution 10 mL, 10 mL, Mouth/Throat, BID, Earlie Server, MD, 10 mL at 12/24/20 2017 .  Chlorhexidine Gluconate Cloth 2 % PADS 6 each, 6 each, Topical, Daily, Cox, Amy N, DO, 6 each at 12/25/20 1004 .  diltiazem (CARDIZEM  CD) 24 hr capsule 240 mg, 240 mg, Oral, Daily, Cox, Amy N, DO, 240 mg at 12/25/20 0646 .  diltiazem (CARDIZEM) tablet 30 mg, 30 mg, Oral, Q6H PRN, Cyndia Skeeters, Taye T, MD .  dronabinol (MARINOL) capsule 2.5 mg, 2.5 mg, Oral, BID AC, Sharen Hones, MD, 2.5 mg at 12/24/20 1655 .  folic acid (FOLVITE) tablet 1 mg, 1 mg, Oral, Daily, Sharen Hones, MD, 1 mg at 12/25/20 0959 .  guaiFENesin (MUCINEX) 12 hr tablet 1,200 mg, 1,200 mg, Oral, BID, Dorothe Pea, RPH, 1,200 mg at 12/25/20 0647 .  heparin ADULT infusion 100 units/mL (25000 units/287mL), 1,000 Units/hr, Intravenous, Continuous, Rauer, Forde Dandy, RPH, Last Rate: 10 mL/hr at 12/25/20 1223, 1,000 Units/hr at 12/25/20 1223 .  magnesium sulfate IVPB 2 g 50 mL, 2 g, Intravenous, Once, Gonfa, Taye T, MD .  metroNIDAZOLE (FLAGYL) IVPB 500 mg, 500 mg, Intravenous,  Q12H, Manuella Ghazi, Vipul, MD, Last Rate: 100 mL/hr at 12/25/20 1002, 500 mg at 12/25/20 1002 .  multivitamin with minerals tablet 1 tablet, 1 tablet, Oral, Daily, Sharen Hones, MD, 1 tablet at 12/25/20 0959 .  ondansetron (ZOFRAN) tablet 4 mg, 4 mg, Oral, Q6H PRN **OR** ondansetron (ZOFRAN) injection 4 mg, 4 mg, Intravenous, Q6H PRN, Cox, Amy N, DO .  oxyCODONE-acetaminophen (PERCOCET/ROXICET) 5-325 MG per tablet 1 tablet, 1 tablet, Oral, Q6H PRN, Mansy, Jan A, MD, 1 tablet at 12/24/20 0944 .  sodium chloride flush (NS) 0.9 % injection 10-40 mL, 10-40 mL, Intracatheter, Q12H, Manuella Ghazi, Vipul, MD, 10 mL at 12/25/20 1004 .  sodium chloride flush (NS) 0.9 % injection 10-40 mL, 10-40 mL, Intracatheter, PRN, Manuella Ghazi, Vipul, MD .  sodium chloride flush (NS) 0.9 % injection 5 mL, 5 mL, Intracatheter, Q8H, Criselda Peaches, MD, 5 mL at 12/25/20 0602 .  thiamine tablet 100 mg, 100 mg, Oral, Daily, Sharen Hones, MD, 100 mg at 12/25/20 1610  Facility-Administered Medications Ordered in Other Encounters:  .  sodium chloride flush (NS) 0.9 % injection 10 mL, 10 mL, Intravenous, PRN, Mike Gip, Melissa C, MD, 10 mL at 11/11/19 9604    ALLERGIES   Patient has no known allergies.     REVIEW OF SYSTEMS    Review of Systems:  Gen:  Denies  fever, sweats, chills weigh loss  HEENT: Denies blurred vision, double vision, ear pain, eye pain, hearing loss, nose bleeds, sore throat Cardiac:  No dizziness, chest pain or heaviness, chest tightness,edema Resp:   Denies cough or sputum porduction, shortness of breath,wheezing, hemoptysis,  Gi: Denies swallowing difficulty, stomach pain, nausea or vomiting, diarrhea, constipation, bowel incontinence Gu:  Denies bladder incontinence, burning urine Ext:   Denies Joint pain, stiffness or swelling Skin: Denies  skin rash, easy bruising or bleeding or hives Endoc:  Denies polyuria, polydipsia , polyphagia or weight change Psych:   Denies depression, insomnia or hallucinations    Other:  All other systems negative   VS: BP 131/89 (BP Location: Right Arm)   Pulse (!) 105   Temp (!) 97.5 F (36.4 C) (Oral)   Resp (!) 22   Ht 6' (1.829 m)   Wt 70.8 kg   SpO2 96%   BMI 21.17 kg/m      PHYSICAL EXAM    GENERAL:NAD, no fevers, chills, no weakness no fatigue HEAD: Normocephalic, atraumatic.  EYES: Pupils equal, round, reactive to light. Extraocular muscles intact. No scleral icterus.  MOUTH: Moist mucosal membrane. Dentition intact. No abscess noted.  EAR, NOSE, THROAT: Clear without exudates. No external lesions.  NECK: Supple. No thyromegaly. No nodules. No JVD.  PULMONARY: Decreased BS on left CARDIOVASCULAR: S1 and S2. Regular rate and rhythm. No murmurs, rubs, or gallops. No edema. Pedal pulses 2+ bilaterally.  GASTROINTESTINAL: Soft, nontender, nondistended. No masses. Positive bowel sounds. No hepatosplenomegaly.  MUSCULOSKELETAL: No swelling, clubbing, or edema. Range of motion full in all extremities.  NEUROLOGIC: Cranial nerves II through XII are intact. No gross focal neurological deficits. Sensation intact. Reflexes intact.  SKIN: No ulceration, lesions, rashes, or cyanosis. Skin warm and dry. Turgor intact.  PSYCHIATRIC: Mood, affect within normal limits. The patient is awake, alert and oriented x 3. Insight, judgment intact.       IMAGING    CT ABDOMEN PELVIS WO CONTRAST  Result Date: 12/17/2020 CLINICAL DATA:  Nausea and vomiting EXAM: CT ABDOMEN AND PELVIS WITHOUT CONTRAST TECHNIQUE: Multidetector CT imaging of the abdomen and pelvis was performed following the standard protocol without IV contrast. COMPARISON:  CT 06/23/2020, PET CT 11/19/2020 FINDINGS: Lower chest: Lung bases demonstrate emphysema. Subpleural bandlike airspace disease at the left lower lobe redemonstrated. Incompletely visualized subpleural peripheral left upper lobe nodule measuring 11 mm, series 5, image 1. Cardiomegaly with trace pericardial effusion. Coronary  vascular calcification. Hepatobiliary: Extensive motion degradation. No focal hepatic abnormality. Gallstones. Gallbladder slightly distended. Possible wall thickening or pericholecystic fluid though limited by motion Pancreas: Unremarkable. No pancreatic ductal dilatation or surrounding inflammatory changes. Spleen: Normal in size without focal abnormality. Adrenals/Urinary Tract: Thickened appearance of adrenal glands. No hydronephrosis. The bladder is unremarkable Stomach/Bowel: Stomach nonenlarged. No dilated small bowel. Negative appendix. No acute bowel wall thickening Vascular/Lymphatic: Advanced aortic atherosclerosis. No aneurysm. Slightly increased multiple small retroperitoneal lymph nodes. Reproductive: Prostate is unremarkable. Other: Negative for free air or free fluid Musculoskeletal: No acute osseous abnormality. IMPRESSION: 1. Degraded by motion. 2. Gallbladder appears slightly distended and there are gallstones. Possible gallbladder wall thickening or pericholecystic fluid though limited by motion, consider correlation with ultrasound 3. Cardiomegaly with trace pericardial effusion. 4. Similar subpleural bandlike density at left lower lobe. Incompletely visualized peripheral left upper lobe lung nodule characterized on recent PET CT Electronically Signed   By: Donavan Foil M.D.   On: 12/17/2020 16:50   CT ANGIO HEAD NECK W WO CM  Result Date: 12/21/2020 CLINICAL DATA:  Neuro deficit, acute, stroke suspected. Neutropenic sepsis. EXAM: CT ANGIOGRAPHY HEAD AND NECK TECHNIQUE: Multidetector CT imaging of the head and neck was performed using the standard protocol during bolus administration of intravenous contrast. Multiplanar CT image reconstructions and MIPs were obtained to evaluate the vascular anatomy. Carotid stenosis measurements (when applicable) are obtained utilizing NASCET criteria, using the distal internal carotid diameter as the denominator. CONTRAST:  62mL OMNIPAQUE IOHEXOL 350  MG/ML SOLN COMPARISON:  MRI of the brain December 20, 2020. FINDINGS: CT HEAD FINDINGS Brain: Confluent hypodensity of the white matter of the cerebral hemispheres, nonspecific, most likely related to chronic small vessel ischemia. Foci of acute infarct are more conspicuous on recent MRI of the brain. No new large territorial infarct. No hemorrhage, hydrocephalus, mass or midline shift. Vascular: No hyperdense vessel or unexpected calcification. Skull: Normal. Negative for fracture or focal lesion. Sinuses: Increased thickness of the walls of the bilateral maxillary sinuses with bubbly secretion on the left. Findings are suggestive of chronic sinusitis. Right mastoid effusion. Orbits: No acute finding. Review of the MIP images confirms the above findings CTA NECK FINDINGS Aortic arch: Standard branching. Imaged portion shows no evidence of aneurysm or dissection. Mild atherosclerotic changes of the aortic  arch without significant stenosis of the major arch vessel origins. Right carotid system: Calcified atherosclerotic disease of the right carotid bifurcation without hemodynamically significant stenosis. Left carotid system: Atherosclerotic disease of the left carotid bifurcation with mixed density plaques without hemodynamically significant stenosis. Vertebral arteries: Calcified atherosclerotic plaque at the origin of the right vertebral artery without hemodynamically significant stenosis. Mild atherosclerotic changes at the origin of the left vertebral artery without hemodynamically significant stenosis. Cervical vertebral arteries otherwise have normal caliber Skeleton: Degenerative changes of the cervical spine with fusion of the facet joints at C2-3 on the left. No acute findings. Other neck: Negative. Upper chest: Left-sided pleural effusion and atelectasis. Mediastinal lymphadenopathy. Review of the MIP images confirms the above findings CTA HEAD FINDINGS Anterior circulation: Calcified atherosclerotic  plaques in the bilateral carotid siphons. No significant stenosis, proximal occlusion, aneurysm, or vascular malformation. Posterior circulation: No significant stenosis, proximal occlusion, aneurysm, or vascular malformation. Hypoplastic/aplastic right P1/PCA segment with right fetal PCA. Venous sinuses: As permitted by contrast timing, patent. Anatomic variants: Right fetal PCA. Review of the MIP images confirms the above findings IMPRESSION: 1. Advanced chronic microvascular ischemic changes of the white matter. 2. Atherosclerotic disease of the bilateral carotid bifurcation without hemodynamically significant stenosis. 3. Mild atherosclerotic changes at the origin of the bilateral vertebral arteries without hemodynamically significant stenosis. 4. Mild intracranial atherosclerotic disease without hemodynamically significant stenosis. Electronically Signed   By: Pedro Earls M.D.   On: 12/21/2020 15:32   CT HEAD WO CONTRAST (5MM)  Result Date: 12/17/2020 CLINICAL DATA:  Nausea vomiting EXAM: CT HEAD WITHOUT CONTRAST TECHNIQUE: Contiguous axial images were obtained from the base of the skull through the vertex without intravenous contrast. COMPARISON:  CT brain 06/24/2020, MRI 03/10/2020, head CT 11/22/2019 FINDINGS: Brain: No acute territorial infarction, hemorrhage or intracranial mass. Extensive white matter disease, progressed from more remote exams from 2021. Stable ventricle size. Chronic lacunar infarcts within the left white matter. Vascular: No hyperdense vessel.  Carotid vascular calcification Skull: Normal. Negative for fracture or focal lesion. Sinuses/Orbits: Mucosal thickening the sinuses. Chronic appearing nasal deformity Other: None IMPRESSION: 1. No definite CT evidence for acute intracranial abnormality. 2. Atrophy. Extensive white matter disease, grossly stable as compared with recent head CT from March, appears progressive when compared to exams from 2021 and prior  Electronically Signed   By: Donavan Foil M.D.   On: 12/17/2020 16:39   MR BRAIN W WO CONTRAST  Addendum Date: 12/21/2020   ADDENDUM REPORT: 12/21/2020 14:49 ADDENDUM: Patient returns for repeat postcontrast imaging. There is no abnormal enhancement. No evidence of intracranial metastatic disease. Electronically Signed   By: Macy Mis M.D.   On: 12/21/2020 14:49   Result Date: 12/21/2020 CLINICAL DATA:  Neuro deficit, acute, stroke suspected Small cell lung cancer, monitor EXAM: MRI HEAD WITHOUT AND WITH CONTRAST TECHNIQUE: Multiplanar, multiecho pulse sequences of the brain and surrounding structures were obtained without and with intravenous contrast. CONTRAST:  7.13mL GADAVIST GADOBUTROL 1 MMOL/ML IV SOLN COMPARISON:  CT head 12/17/2020. FINDINGS: Mildly motion limited exam.  Within this limitation: Brain: Multiple small areas of restricted diffusion in the bilateral frontal and occipital, and left parietal matter, left perirolandic cortex, left thalamocapsular region, and left cerebellum. Advanced confluent T2 hyperintensity within the white matter, nonspecific but compatible with chronic microvascular ischemic disease and progressed since 2021. Small remote infarct in the left corona radiata. No hydrocephalus, obvious mass lesion, midline shift, acute hemorrhage, or extra-axial fluid collection. Punctate foci of susceptibility artifact in the  left cerebellum and left frontal white matter, nonspecific but potentially related to prior microhemorrhage. Contrast was administered; however, there is no evidence of enhancement (for example, the nasal mucosa/turbinates are nonenhancing). This precludes evaluation for enhancing lesions. Vascular: Major arterial flow voids are maintained at the skull base. Skull and upper cervical spine: Normal marrow signal. Sinuses/Orbits: Mild paranasal sinus disease. Other: Moderate right and small left mastoid effusions. IMPRESSION: 1. Multiple small areas of restricted  diffusion in the bilateral frontal and occipital and left parietal white matter, left perirolandic cortex, left thalamocapsular region, and left cerebellum, which are concerning for acute infarcts. Given involvement of multiple vascular territories, consider a central embolic etiology. 2. Contrast was administered; however, there is no evidence of enhancement, unclear why given reported uneventful injection per the technologist. This precludes evaluation for enhancing lesions. Given the patient's known small cell lung cancer diagnosis, recommend repeat postcontrast imaging to exclude metastatic disease. I've asked the MRI technologist to not bill the patient for the repeat post-contrast imaging. 3. Advanced chronic microvascular ischemic disease, progressed since 2021. 4. Moderate right and small left mastoid effusions. Electronically Signed: By: Margaretha Sheffield M.D. On: 12/20/2020 14:29   IR Perc Cholecystostomy  Result Date: 12/20/2020 INDICATION: 70 year old male with small cell lung cancer and chemotherapy induced pancytopenia presenting with neutropenic sepsis. Blood cultures grew out Klebsiella and imaging findings are concerning for acute cholecystitis. He is too sick and frail to be considered an operative candidate. Following transfusion of platelets, his platelets are now at an acceptable level to proceed with percutaneous cholecystostomy tube placement. EXAM: CHOLECYSTOSTOMY MEDICATIONS: In patient currently receiving intravenous antibiotic therapy. No additional antibiotic prophylaxis administered. ANESTHESIA/SEDATION: Moderate (conscious) sedation was employed during this procedure. A total of Versed 1 mg and Fentanyl 50 mcg and 0.5 mg Dilaudid administered intravenously. Moderate Sedation Time: 13 minutes. The patient's level of consciousness and vital signs were monitored continuously by radiology nursing throughout the procedure under my direct supervision. FLUOROSCOPY TIME:  Fluoroscopy Time:  0 minutes 30 seconds (4.3 mGy). COMPLICATIONS: None immediate. PROCEDURE: Informed written consent was obtained from the patient after a thorough discussion of the procedural risks, benefits and alternatives. All questions were addressed. Maximal Sterile Barrier Technique was utilized including caps, mask, sterile gowns, sterile gloves, sterile drape, hand hygiene and skin antiseptic. A timeout was performed prior to the initiation of the procedure. The right upper quadrant was interrogated with ultrasound. The distended gallbladder with a thickened wall was successfully identified. There is significant excursion of the liver with respiration due to use of abdominal muscles. A suitable skin entry site was selected. Local anesthesia was attained by infiltration with 1% lidocaine. A small dermatotomy was made. Under real-time ultrasound guidance, the movement of the liver was timed with respirations and in a single pass, a 21 gauge Accustick needle was advanced through the liver and into the gallbladder lumen along a short transhepatic course. A 0.018 wire was quickly advanced into the gallbladder lumen and the needle was exchanged for the transitional dilator. The transitional dilator was advanced into the gallbladder lumen. The wire was removed. Contrast injection was performed under fluoroscopy confirming opacification of the gallbladder lumen. Multiple filling defects consistent with cholelithiasis. The cystic duct is occluded. A 0.035 wire was then coiled within the gallbladder lumen. The transitional dilator was removed. The transhepatic tract was dilated to 10 [place] with a stiff dilator. A Cook 10.2 [place] all-purpose drainage catheter was then advanced over the wire and formed in the gallbladder lumen. Images were  obtained and stored for the medical record. The catheter was gently flushed and connected to gravity bag drainage before being secured to the skin with 0 silk suture. IMPRESSION: Successful  placement of 10 French transhepatic percutaneous cholecystostomy tube for an indication of calculus cholecystitis. PLAN: Maintain tube to gravity bag drainage. Patient will likely need home health to assist with care and cleaning of the tube site. Return to interventional radiology approximately every 8 weeks for cholecystostomy tube check and exchange. If patient's clinical status improves in the future, elective cholecystectomy would be optimal. If patient remains a poor operative candidate, long-term cholecystostomy tube maintenance will likely be required. Electronically Signed   By: Jacqulynn Cadet M.D.   On: 12/20/2020 13:50   DG Chest Port 1 View  Result Date: 12/17/2020 CLINICAL DATA:  Questionable sepsis.  Weakness EXAM: PORTABLE CHEST 1 VIEW COMPARISON:  03/09/2020 FINDINGS: Left subclavian approached chest port remains in place. Stable cardiomegaly. Chronic left basilar scarring or atelectasis. Peripheral left upper lobe nodule better seen on prior CT. No new airspace consolidation. Pleural effusion or pneumothorax. IMPRESSION: Chronic left basilar scarring or atelectasis. No new or acute findings. Electronically Signed   By: Davina Poke D.O.   On: 12/17/2020 13:20   ECHOCARDIOGRAM COMPLETE  Result Date: 12/21/2020    ECHOCARDIOGRAM REPORT   Patient Name:   KOLE HILYARD Date of Exam: 12/21/2020 Medical Rec #:  [id number]    Height:       72.0 in Accession #:    [id number]   Weight:       137.1 lb Date of Birth:  1950-05-25     BSA:          1.815 m Patient Age:    70 years     BP:           114/80 mmHg Patient Gender: M            HR:           89 bpm. Exam Location:  [facility] Procedure: 2D Echo, Color Doppler and Cardiac Doppler Indications:     Endocarditis I38  History:         Patient has prior history of Echocardiogram examinations, most                  recent 08/19/2019. Arrythmias:Atrial Fibrillation; Risk                  Factors:Hypertension. Substance abuse.  Sonographer:     Sherrie Sport  Referring Phys:  [place] Diagnosing Phys: Serafina Royals MD IMPRESSIONS  1. Left ventricular ejection fraction, by estimation, is 60 to 65%. The left ventricle has normal function. The left ventricle has no regional wall motion abnormalities. Left ventricular diastolic parameters were normal.  2. Right ventricular systolic function is normal. The right ventricular size is normal.  3. The mitral valve is normal in structure. Trivial mitral valve regurgitation.  4. The aortic valve is normal in structure. Aortic valve regurgitation is not visualized. Mild aortic valve sclerosis is present, with no evidence of aortic valve stenosis. Conclusion(s)/Recommendation(s): No evidence of valve vegetations. FINDINGS  Left Ventricle: Left ventricular ejection fraction, by estimation, is 60 to 65%. The left ventricle has normal function. The left ventricle has no regional wall motion abnormalities. The left ventricular internal cavity size was small. There is no left ventricular hypertrophy. Left ventricular diastolic parameters were normal. Right Ventricle: The right ventricular size is normal. No increase in right ventricular wall  thickness. Right ventricular systolic function is normal. Left Atrium: Left atrial size was normal in size. Right Atrium: Right atrial size was normal in size. Pericardium: There is no evidence of pericardial effusion. Mitral Valve: The mitral valve is normal in structure. Trivial mitral valve regurgitation. Tricuspid Valve: The tricuspid valve is normal in structure. Tricuspid valve regurgitation is trivial. Aortic Valve: The aortic valve is normal in structure. Aortic valve regurgitation is not visualized. Mild aortic valve sclerosis is present, with no evidence of aortic valve stenosis. Aortic valve mean gradient measures 3.0 mmHg. Aortic valve peak gradient measures 5.9 mmHg. Aortic valve area, by VTI measures 2.30 cm. Pulmonic Valve: The pulmonic valve was normal in structure. Pulmonic  valve regurgitation is not visualized. Aorta: The aortic root and ascending aorta are structurally normal, with no evidence of dilitation. IAS/Shunts: No atrial level shunt detected by color flow Doppler.  LEFT VENTRICLE PLAX 2D LVIDd:         3.87 cm LVIDs:         2.30 cm LV PW:         1.00 cm LV IVS:        1.18 cm LVOT diam:     2.10 cm LV SV:         44 LV SV Index:   24 LVOT Area:     3.46 cm  RIGHT VENTRICLE RV Basal diam:  3.70 cm RV S prime:     12.40 cm/s TAPSE (M-mode): 3.7 cm LEFT ATRIUM           Index       RIGHT ATRIUM           Index LA diam:      3.50 cm 1.93 cm/m  RA Area:     33.70 cm LA Vol (A2C): 57.4 ml 31.63 ml/m RA Volume:   128.00 ml 70.53 ml/m LA Vol (A4C): 72.1 ml 39.73 ml/m  AORTIC VALVE                   PULMONIC VALVE AV Area (Vmax):    2.34 cm    PV Vmax:        0.60 m/s AV Area (Vmean):   2.32 cm    PV Peak grad:   1.4 mmHg AV Area (VTI):     2.30 cm    RVOT Peak grad: 2 mmHg AV Vmax:           121.00 cm/s AV Vmean:          86.000 cm/s AV VTI:            0.193 m AV Peak Grad:      5.9 mmHg AV Mean Grad:      3.0 mmHg LVOT Vmax:         81.60 cm/s LVOT Vmean:        57.600 cm/s LVOT VTI:          0.128 m LVOT/AV VTI ratio: 0.66  AORTA Ao Root diam: 3.50 cm MITRAL VALVE               TRICUSPID VALVE MV Area (PHT): 4.52 cm    TR Peak grad:   24.4 mmHg MV Decel Time: 168 msec    TR Vmax:        247.00 cm/s MV E velocity: 88.30 cm/s  SHUNTS                            Systemic VTI:  0.13 m                            Systemic Diam: 2.10 cm Serafina Royals MD Electronically signed by Serafina Royals MD Signature Date/Time: 12/21/2020/5:20:09 PM    Final    US Abdomen Limited RUQ (LIVER/GB)  Result Date: 12/18/2020 CLINICAL DATA:  Evaluate for cholecystitis. EXAM: ULTRASOUND ABDOMEN LIMITED RIGHT UPPER QUADRANT COMPARISON:  CT AP from 12/17/2020 FINDINGS: Gallbladder: The gallbladder wall is diffusely edematous measuring up to 13.1 mm. Gallstones are noted  measuring up to 5.9 mm. Pericholecystic fluid. Positive sonographic Murphy's sign. Common bile duct: Diameter: 6 mm Liver: Diffusely increased parenchymal echogenicity identified. No focal liver abnormality. Portal vein is patent on color Doppler imaging with normal direction of blood flow towards the liver. Other: 1.9 cm right kidney cyst. IMPRESSION: Gallstones, gallbladder wall thickening and pericholecystic fluid with positive sonographic Murphy's sign. Findings are compatible with acute cholecystitis. Electronically Signed   By: Kerby Moors M.D.   On: 12/18/2020 14:18      ASSESSMENT/PLAN   Acute on chronic hypoxemic respiratory failure There is complete atelectasis of left lung secondary to left pleural effusion     - stop heparin transiently and perform diagnostic and therapeutic thoracentesis   - Pleural fluid studies ordered both infections and malignant workup    - Reviewed with patient he is agreeable to plan   Sepsis with Klebsiella bacteremia  On IV antibiotics    - ID on case appreciate input   Extensive stage small cell lung ca Currently on chemo/immunotherapy  - oncology on case - prognosis poor - appreciate input   Thank you for allowing me to participate in the care of this patient.  Total face to face encounter time for this patient visit was >20min. >50% of the time was  spent in counseling and coordination of care.   Patient/Family are satisfied with care plan and all questions have been answered.  This document was prepared using Dragon voice recognition software and may include unintentional dictation errors.     Ottie Glazier, M.D.  Division of [place]

## 2020-12-25 NOTE — Care Management Important Message (Signed)
Important Message  Patient Details  Name: CLOYD RAGAS MRN: [id number] Date of Birth: [birth year]   Medicare Important Message Given:  Yes     Dannette Barbara 12/25/2020, 3:15 PM

## 2020-12-25 NOTE — Progress Notes (Signed)
Modified Barium Swallow Progress Note  Patient Details  Name: Scott Jordan MRN: [id number] Date of Birth: Jun 14, 1950  Today's Date: 12/25/2020  Modified Barium Swallow completed.  Full report located under Chart Review in the Imaging Section.  Brief recommendations include the following:  Clinical Impression  Pt presents with adequate airway protection while consuming thin liquids, nectar thick liquids, puree and whole barium tablet with thin liquids. Pt's oral phase is unremarkable and his swallow response is timely with good airway protection. His overall medical comorbidities and respiratory decline place him at an overall increased risk of aspiration. For example, when consuming consecutive sips of thin liquids, pt became short of breath. At this time, recommend pt continue consuming dysphagia 3 diet for energy conservation with thin liquids, medicine whole with thin liquids. Pt needs to follow strict aspiration precautions such as taking single sips and rest breaks.   Swallow Evaluation Recommendations       SLP Diet Recommendations: Dysphagia 3 (Mech soft) solids;Thin liquid   Liquid Administration via: Cup   Medication Administration: Whole meds with liquid   Supervision: Patient able to self feed   Compensations: Minimize environmental distractions;Slow rate;Small sips/bites   Postural Changes: Remain semi-upright after after feeds/meals (Comment);Seated upright at 90 degrees   Oral Care Recommendations: Oral care BID       Scott Bahl B. [name] M.S., CCC-SLP, [place] Office [phone]  Scott Jordan [name] 12/25/2020,2:15 PM

## 2020-12-25 NOTE — TOC Progression Note (Signed)
Transition of Care [facility]) - Progression Note    Patient Details  Name: Scott Jordan MRN: [id number] Date of Birth: 07/14/1950  Transition of Care [facility]) CM/SW Contact  Eileen Stanford, LCSW Phone Number: 12/25/2020, 3:26 PM  Clinical Narrative:   Scott Jordan can not take. Niece now chooses [facility]. Pt not medically stable.    Expected Discharge Plan: [place] Barriers to Discharge: Continued Medical Work up  Expected Discharge Plan and Services Expected Discharge Plan: [place] In-house Referral: Clinical Social Work   Post Acute Care Choice: [place] Living arrangements for the past 2 months: Single Family Home                                       Social Determinants of Health (SDOH) Interventions    Readmission Risk Interventions No flowsheet data found.

## 2020-12-25 NOTE — Progress Notes (Addendum)
[place] for heparin infusion Indication: afib and possible thromboembolic stroke  No Known Allergies  Patient Measurements: Height: 6' (182.9 cm) Weight: 70.8 kg (156 lb 1.4 oz) IBW/kg (Calculated) : 77.6  Vital Signs: Temp: 98 F (36.7 C) (09/16 0830) Temp Source: Oral (09/16 0830) BP: 137/92 (09/16 0830) Pulse Rate: 113 (09/16 0830)  Labs: Recent Labs    12/23/20 0305 12/24/20 0506 12/25/20 0554  HGB 9.0* 10.2* 10.1*  HCT 26.0* 28.4* 29.3*  PLT 83* 133* 198  CREATININE 0.69 0.66 0.71  CKTOTAL  --   --  62    Estimated Creatinine Clearance: 87.3 mL/min (by C-G formula based on SCr of 0.71 mg/dL).   Medical History: Past Medical History:  Diagnosis Date   A-fib ([place]) 01/10/2019   pt st this was dx by Dr. Ubaldo Glassing   Cancer [facility]) [id number]   LUNG   Complication of anesthesia    DIFFICULTY WAKING UP AFTER SURGERY- 20 YRS AGO   Hypertension    Lung cancer ([place])    Substance abuse ([place])     Medications:  Eliquis 5 mg BID - last dose 9/15 2017  Assessment: 70 year old M with PMH of stage IV small cell lung cancer on chemo, chemo induced pancytopenia, A. fib, chronic hep C, chronic cancer pain, HTN, alcohol abuse, malnutrition and polysubstance use presenting with nausea and vomiting and admitted with neutropenic sepsis due to acute cholecystitis and Klebsiella bacteremia. Pt with acute stroke confirmed on MRI 9/11. Pharmacy has been consulted for heparin dosing/monitoring. Anticipate initial HL to be elevated since pt last dose of Eliquis was 9/15. Pt planned for procedure on Monday (9/19).  Goal of Therapy:  Heparin level 0.3-0.5 units/ml - confirmed with MD to utilize stroke goals aPTT 66-85 seconds - confirmed with MD to utilize stroke goals Monitor platelets by anticoagulation protocol: Yes   Plan:  No initial bolus per thromboembolic stroke indication. Start heparin infusion at 1000 units/hr Check aPTT in 6 hours and HL daily  while on heparin. Switch to HL monitoring once correlating with aPTT Continue to monitor H&H and platelets  Joal Eakle O Averi Kilty 12/25/2020,9:02 AM

## 2020-12-25 NOTE — Progress Notes (Signed)
   12/25/20 0634  Assess: MEWS Score  Temp 98 F (36.7 C)  BP (!) 138/100  Pulse Rate (!) 128  Resp 20  SpO2 98 %  O2 Device Room Air  Assess: MEWS Score  MEWS Temp 0  MEWS Systolic 0  MEWS Pulse 2  MEWS RR 0  MEWS LOC 0  MEWS Score 2  MEWS Score Color Yellow  Assess: if the MEWS score is Yellow or Red  Were vital signs taken at a resting state? Yes  Focused Assessment Change from prior assessment (see assessment flowsheet)  Does the patient meet 2 or more of the SIRS criteria? No  MEWS guidelines implemented *See Row Information* Yes  Treat  MEWS Interventions Administered scheduled meds/treatments  Pain Scale 0-10  Pain Score 0  Take Vital Signs  Increase Vital Sign Frequency  Yellow: Q 2hr X 2 then Q 4hr X 2, if remains yellow, continue Q 4hrs  Escalate  MEWS: Escalate Yellow: discuss with charge nurse/RN and consider discussing with provider and RRT  Notify: Charge Nurse/RN  Name of Charge Nurse/RN Notified Phoebe Sharps RN  Date Charge Nurse/RN Notified 12/25/20  Time Charge Nurse/RN Notified [id number]  Notify: Provider  Provider Name/Title Sharion Settler  Date Provider Notified 12/25/20  Time Provider Notified 0630  Notification Type Page  Notification Reason Change in status  Provider response See new orders  Date of Provider Response 12/25/20  Time of Provider Response [phone]  Document  Patient Outcome Other (Comment) (Diltiazem and Mucinex given, will cont to monitor)  Assess: SIRS CRITERIA  SIRS Temperature  0  SIRS Pulse 1  SIRS Respirations  0  SIRS WBC 0  SIRS Score Sum  1   Pt was coughing and having difficulty to spit out his phelgm. Pt HR increased 120s-130s and Bp elevated. Pt has history of afib. Oncall provider informed and given Diltiazem and Mucinex. Will cont to monitor.

## 2020-12-25 NOTE — H&P (Signed)
Chief Complaint: Malnutrition  Referring Physician(s): Wendee Beavers  Supervising Physician: Michaelle Birks  Patient Status: [facility] [facility] - In-pt  History of Present Illness: Scott Jordan is a 70 y.o. male who is known to our service. He is s/p percutaneous cholecystostomy on 12/20/20 by Dr. Laurence Ferrari.  He has extensive small cell lung cancer and has undergone cycle 1 of carboplatin/etoposide.  He presented to the ED on 12/17/2020 with N/V.   He had chemotherapy induced pancytopenia with neutropenic sepsis.   Blood cultures grew Klebsiella.  He has remained in the hospital since and is being treated with antibiotics.  He has had significant weight loss and malnutrition due to his cancer.  We are asked to place a gastrostomy tube for additional nutritional support.  He is currently anticoagulated on Eliquis for Afib with possible thromboembolic stroke.  This will need to be held x 48 hours prior to procedure to minimize bleeding risk.  Past Medical History:  Diagnosis Date   A-fib ([place]) 01/10/2019   pt st this was dx by Dr. Ubaldo Glassing   Cancer [facility]) [id number]   LUNG   Complication of anesthesia    DIFFICULTY WAKING UP AFTER SURGERY- 20 YRS AGO   Hypertension    Lung cancer ([place])    Substance abuse ([place])     Past Surgical History:  Procedure Laterality Date   ARM DEBRIDEMENT Left    INCISION AND DEBRIDEMENT LOWER ARM -20 YRS AGO   BACK SURGERY     IR PERC CHOLECYSTOSTOMY  12/20/2020   PORTACATH PLACEMENT Left 08/29/2019   Procedure: INSERTION PORT-A-CATH;  Surgeon: Nestor Lewandowsky, MD;  Location: [facility] ORS;  Service: General;  Laterality: Left;    Allergies: Patient has no known allergies.  Medications: Prior to Admission medications   Medication Sig Start Date End Date Taking? Authorizing Provider  diltiazem (TIAZAC) 240 MG 24 hr capsule Take 1 capsule by mouth daily. 07/24/19  Yes [provider]  ELIQUIS 5 MG TABS tablet Take 5 mg by mouth 2 (two) times daily. 08/30/19   Yes [provider]  magnesium oxide (MAG-OX) 400 MG tablet Take by mouth.   Yes [provider]  potassium chloride SA (KLOR-CON) 20 MEQ tablet TAKE 1 TABLET BY MOUTH DAILY FOR 3 DAYS 12/03/19  Yes Lequita Asal, MD  prochlorperazine (COMPAZINE) 10 MG tablet Take 1 tablet (10 mg total) by mouth every 6 (six) hours as needed for nausea or vomiting. 12/08/20  Yes Earlie Server, MD  feeding supplement, ENSURE ENLIVE, (ENSURE ENLIVE) LIQD Take 237 mLs by mouth 3 (three) times daily between meals. 08/23/19   Enzo Bi, MD  lidocaine-prilocaine (EMLA) cream Apply to affected area once Patient not taking: No sig reported 08/26/19   Lequita Asal, MD  MAVYRET 100-40 MG TABS Take 3 tablets by mouth daily. Patient not taking: No sig reported 08/18/20   [provider]  mirtazapine (REMERON) 15 MG tablet Take by mouth. Patient not taking: No sig reported 06/26/20 06/26/21  [provider]  naloxone [facility]) nasal spray 4 mg/0.1 mL once as needed Patient not taking: No sig reported 01/13/19   [provider]  ondansetron (ZOFRAN) 8 MG tablet Take 1 tablet (8 mg total) by mouth every 8 (eight) hours as needed for refractory nausea / vomiting. Start on day 3 after carboplatin chemo. Patient not taking: No sig reported 08/26/19   Lequita Asal, MD     History reviewed. No pertinent family history.  Social History  Socioeconomic History   Marital status: Single    Spouse name: Not on file   Number of children: Not on file   Years of education: Not on file   Highest education level: Not on file  Occupational History   Not on file  Tobacco Use   Smoking status: Former    Packs/day: 0.50    Types: Cigarettes   Smokeless tobacco: Never   Tobacco comments:    not smoked since 6 weeks ago  Vaping Use   Vaping Use: Never used  Substance and Sexual Activity   Alcohol use: Yes    Comment: 18 OZ IN WEEK   Drug use: Not Currently    Types: Heroin     Comment: heroin WITHIN PAST YEAR   Sexual activity: Not on file  Other Topics Concern   Not on file  Social History Narrative   Not on file   Social Determinants of Health   Financial Resource Strain: Not on file  Food Insecurity: Not on file  Transportation Needs: Not on file  Physical Activity: Not on file  Stress: Not on file  Social Connections: Not on file     Review of Systems: A 12 point ROS discussed and pertinent positives are indicated in the HPI above.  All other systems are negative.  Review of Systems  Vital Signs: BP 131/89 (BP Location: Right Arm)   Pulse (!) 105   Temp (!) 97.5 F (36.4 C) (Oral)   Resp (!) 22   Ht 6' (1.829 m)   Wt 70.8 kg   SpO2 96%   BMI 21.17 kg/m   Physical Exam Constitutional:      Appearance: He is ill-appearing.  HENT:     Head: Normocephalic and atraumatic.  Cardiovascular:     Rate and Rhythm: Normal rate.  Pulmonary:     Effort: Pulmonary effort is normal. No respiratory distress.  Abdominal:     Palpations: Abdomen is soft.     Tenderness: There is no abdominal tenderness.     Comments: Perc Chole drain in place - no issues.  ~250 mL output = bilious   Musculoskeletal:        General: Normal range of motion.  Skin:    General: Skin is warm and dry.  Neurological:     General: No focal deficit present.     Mental Status: He is alert and oriented to person, place, and time.  Psychiatric:        Mood and Affect: Mood normal.        Behavior: Behavior normal.        Thought Content: Thought content normal.        Judgment: Judgment normal.    Imaging: CT ABDOMEN PELVIS WO CONTRAST  Result Date: 12/17/2020 CLINICAL DATA:  Nausea and vomiting EXAM: CT ABDOMEN AND PELVIS WITHOUT CONTRAST TECHNIQUE: Multidetector CT imaging of the abdomen and pelvis was performed following the standard protocol without IV contrast. COMPARISON:  CT 06/23/2020, PET CT 11/19/2020 FINDINGS: Lower chest: Lung bases demonstrate emphysema.  Subpleural bandlike airspace disease at the left lower lobe redemonstrated. Incompletely visualized subpleural peripheral left upper lobe nodule measuring 11 mm, series 5, image 1. Cardiomegaly with trace pericardial effusion. Coronary vascular calcification. Hepatobiliary: Extensive motion degradation. No focal hepatic abnormality. Gallstones. Gallbladder slightly distended. Possible wall thickening or pericholecystic fluid though limited by motion Pancreas: Unremarkable. No pancreatic ductal dilatation or surrounding inflammatory changes. Spleen: Normal in size without focal abnormality. Adrenals/Urinary Tract: Thickened appearance of  adrenal glands. No hydronephrosis. The bladder is unremarkable Stomach/Bowel: Stomach nonenlarged. No dilated small bowel. Negative appendix. No acute bowel wall thickening Vascular/Lymphatic: Advanced aortic atherosclerosis. No aneurysm. Slightly increased multiple small retroperitoneal lymph nodes. Reproductive: Prostate is unremarkable. Other: Negative for free air or free fluid Musculoskeletal: No acute osseous abnormality. IMPRESSION: 1. Degraded by motion. 2. Gallbladder appears slightly distended and there are gallstones. Possible gallbladder wall thickening or pericholecystic fluid though limited by motion, consider correlation with ultrasound 3. Cardiomegaly with trace pericardial effusion. 4. Similar subpleural bandlike density at left lower lobe. Incompletely visualized peripheral left upper lobe lung nodule characterized on recent PET CT Electronically Signed   By: Donavan Foil M.D.   On: 12/17/2020 16:50   CT ANGIO HEAD NECK W WO CM  Result Date: 12/21/2020 CLINICAL DATA:  Neuro deficit, acute, stroke suspected. Neutropenic sepsis. EXAM: CT ANGIOGRAPHY HEAD AND NECK TECHNIQUE: Multidetector CT imaging of the head and neck was performed using the standard protocol during bolus administration of intravenous contrast. Multiplanar CT image reconstructions and MIPs were  obtained to evaluate the vascular anatomy. Carotid stenosis measurements (when applicable) are obtained utilizing NASCET criteria, using the distal internal carotid diameter as the denominator. CONTRAST:  8mL OMNIPAQUE IOHEXOL 350 MG/ML SOLN COMPARISON:  MRI of the brain December 20, 2020. FINDINGS: CT HEAD FINDINGS Brain: Confluent hypodensity of the white matter of the cerebral hemispheres, nonspecific, most likely related to chronic small vessel ischemia. Foci of acute infarct are more conspicuous on recent MRI of the brain. No new large territorial infarct. No hemorrhage, hydrocephalus, mass or midline shift. Vascular: No hyperdense vessel or unexpected calcification. Skull: Normal. Negative for fracture or focal lesion. Sinuses: Increased thickness of the walls of the bilateral maxillary sinuses with bubbly secretion on the left. Findings are suggestive of chronic sinusitis. Right mastoid effusion. Orbits: No acute finding. Review of the MIP images confirms the above findings CTA NECK FINDINGS Aortic arch: Standard branching. Imaged portion shows no evidence of aneurysm or dissection. Mild atherosclerotic changes of the aortic arch without significant stenosis of the major arch vessel origins. Right carotid system: Calcified atherosclerotic disease of the right carotid bifurcation without hemodynamically significant stenosis. Left carotid system: Atherosclerotic disease of the left carotid bifurcation with mixed density plaques without hemodynamically significant stenosis. Vertebral arteries: Calcified atherosclerotic plaque at the origin of the right vertebral artery without hemodynamically significant stenosis. Mild atherosclerotic changes at the origin of the left vertebral artery without hemodynamically significant stenosis. Cervical vertebral arteries otherwise have normal caliber Skeleton: Degenerative changes of the cervical spine with fusion of the facet joints at C2-3 on the left. No acute findings.  Other neck: Negative. Upper chest: Left-sided pleural effusion and atelectasis. Mediastinal lymphadenopathy. Review of the MIP images confirms the above findings CTA HEAD FINDINGS Anterior circulation: Calcified atherosclerotic plaques in the bilateral carotid siphons. No significant stenosis, proximal occlusion, aneurysm, or vascular malformation. Posterior circulation: No significant stenosis, proximal occlusion, aneurysm, or vascular malformation. Hypoplastic/aplastic right P1/PCA segment with right fetal PCA. Venous sinuses: As permitted by contrast timing, patent. Anatomic variants: Right fetal PCA. Review of the MIP images confirms the above findings IMPRESSION: 1. Advanced chronic microvascular ischemic changes of the white matter. 2. Atherosclerotic disease of the bilateral carotid bifurcation without hemodynamically significant stenosis. 3. Mild atherosclerotic changes at the origin of the bilateral vertebral arteries without hemodynamically significant stenosis. 4. Mild intracranial atherosclerotic disease without hemodynamically significant stenosis. Electronically Signed   By: Pedro Earls M.D.   On: 12/21/2020 15:32  CT HEAD WO CONTRAST (5MM)  Result Date: 12/17/2020 CLINICAL DATA:  Nausea vomiting EXAM: CT HEAD WITHOUT CONTRAST TECHNIQUE: Contiguous axial images were obtained from the base of the skull through the vertex without intravenous contrast. COMPARISON:  CT brain 06/24/2020, MRI 03/10/2020, head CT 11/22/2019 FINDINGS: Brain: No acute territorial infarction, hemorrhage or intracranial mass. Extensive white matter disease, progressed from more remote exams from 2021. Stable ventricle size. Chronic lacunar infarcts within the left white matter. Vascular: No hyperdense vessel.  Carotid vascular calcification Skull: Normal. Negative for fracture or focal lesion. Sinuses/Orbits: Mucosal thickening the sinuses. Chronic appearing nasal deformity Other: None IMPRESSION: 1. No  definite CT evidence for acute intracranial abnormality. 2. Atrophy. Extensive white matter disease, grossly stable as compared with recent head CT from March, appears progressive when compared to exams from 2021 and prior Electronically Signed   By: Donavan Foil M.D.   On: 12/17/2020 16:39   MR BRAIN W WO CONTRAST  Addendum Date: 12/21/2020   ADDENDUM REPORT: 12/21/2020 14:49 ADDENDUM: Patient returns for repeat postcontrast imaging. There is no abnormal enhancement. No evidence of intracranial metastatic disease. Electronically Signed   By: Macy Mis M.D.   On: 12/21/2020 14:49   Result Date: 12/21/2020 CLINICAL DATA:  Neuro deficit, acute, stroke suspected Small cell lung cancer, monitor EXAM: MRI HEAD WITHOUT AND WITH CONTRAST TECHNIQUE: Multiplanar, multiecho pulse sequences of the brain and surrounding structures were obtained without and with intravenous contrast. CONTRAST:  7.51mL GADAVIST GADOBUTROL 1 MMOL/ML IV SOLN COMPARISON:  CT head 12/17/2020. FINDINGS: Mildly motion limited exam.  Within this limitation: Brain: Multiple small areas of restricted diffusion in the bilateral frontal and occipital, and left parietal matter, left perirolandic cortex, left thalamocapsular region, and left cerebellum. Advanced confluent T2 hyperintensity within the white matter, nonspecific but compatible with chronic microvascular ischemic disease and progressed since 2021. Small remote infarct in the left corona radiata. No hydrocephalus, obvious mass lesion, midline shift, acute hemorrhage, or extra-axial fluid collection. Punctate foci of susceptibility artifact in the left cerebellum and left frontal white matter, nonspecific but potentially related to prior microhemorrhage. Contrast was administered; however, there is no evidence of enhancement (for example, the nasal mucosa/turbinates are nonenhancing). This precludes evaluation for enhancing lesions. Vascular: Major arterial flow voids are maintained at  the skull base. Skull and upper cervical spine: Normal marrow signal. Sinuses/Orbits: Mild paranasal sinus disease. Other: Moderate right and small left mastoid effusions. IMPRESSION: 1. Multiple small areas of restricted diffusion in the bilateral frontal and occipital and left parietal white matter, left perirolandic cortex, left thalamocapsular region, and left cerebellum, which are concerning for acute infarcts. Given involvement of multiple vascular territories, consider a central embolic etiology. 2. Contrast was administered; however, there is no evidence of enhancement, unclear why given reported uneventful injection per the technologist. This precludes evaluation for enhancing lesions. Given the patient's known small cell lung cancer diagnosis, recommend repeat postcontrast imaging to exclude metastatic disease. I've asked the MRI technologist to not bill the patient for the repeat post-contrast imaging. 3. Advanced chronic microvascular ischemic disease, progressed since 2021. 4. Moderate right and small left mastoid effusions. Electronically Signed: By: Margaretha Sheffield M.D. On: 12/20/2020 14:29   IR Perc Cholecystostomy  Result Date: 12/20/2020 INDICATION: 70 year old male with small cell lung cancer and chemotherapy induced pancytopenia presenting with neutropenic sepsis. Blood cultures grew out Klebsiella and imaging findings are concerning for acute cholecystitis. He is too sick and frail to be considered an operative candidate. Following transfusion of platelets,  his platelets are now at an acceptable level to proceed with percutaneous cholecystostomy tube placement. EXAM: CHOLECYSTOSTOMY MEDICATIONS: In patient currently receiving intravenous antibiotic therapy. No additional antibiotic prophylaxis administered. ANESTHESIA/SEDATION: Moderate (conscious) sedation was employed during this procedure. A total of Versed 1 mg and Fentanyl 50 mcg and 0.5 mg Dilaudid administered intravenously.  Moderate Sedation Time: 13 minutes. The patient's level of consciousness and vital signs were monitored continuously by radiology nursing throughout the procedure under my direct supervision. FLUOROSCOPY TIME:  Fluoroscopy Time: 0 minutes 30 seconds (4.3 mGy). COMPLICATIONS: None immediate. PROCEDURE: Informed written consent was obtained from the patient after a thorough discussion of the procedural risks, benefits and alternatives. All questions were addressed. Maximal Sterile Barrier Technique was utilized including caps, mask, sterile gowns, sterile gloves, sterile drape, hand hygiene and skin antiseptic. A timeout was performed prior to the initiation of the procedure. The right upper quadrant was interrogated with ultrasound. The distended gallbladder with a thickened wall was successfully identified. There is significant excursion of the liver with respiration due to use of abdominal muscles. A suitable skin entry site was selected. Local anesthesia was attained by infiltration with 1% lidocaine. A small dermatotomy was made. Under real-time ultrasound guidance, the movement of the liver was timed with respirations and in a single pass, a 21 gauge Accustick needle was advanced through the liver and into the gallbladder lumen along a short transhepatic course. A 0.018 wire was quickly advanced into the gallbladder lumen and the needle was exchanged for the transitional dilator. The transitional dilator was advanced into the gallbladder lumen. The wire was removed. Contrast injection was performed under fluoroscopy confirming opacification of the gallbladder lumen. Multiple filling defects consistent with cholelithiasis. The cystic duct is occluded. A 0.035 wire was then coiled within the gallbladder lumen. The transitional dilator was removed. The transhepatic tract was dilated to 10 [place] with a stiff dilator. A Cook 10.2 [place] all-purpose drainage catheter was then advanced over the wire and formed in the  gallbladder lumen. Images were obtained and stored for the medical record. The catheter was gently flushed and connected to gravity bag drainage before being secured to the skin with 0 silk suture. IMPRESSION: Successful placement of 10 French transhepatic percutaneous cholecystostomy tube for an indication of calculus cholecystitis. PLAN: Maintain tube to gravity bag drainage. Patient will likely need home health to assist with care and cleaning of the tube site. Return to interventional radiology approximately every 8 weeks for cholecystostomy tube check and exchange. If patient's clinical status improves in the future, elective cholecystectomy would be optimal. If patient remains a poor operative candidate, long-term cholecystostomy tube maintenance will likely be required. Electronically Signed   By: Jacqulynn Cadet M.D.   On: 12/20/2020 13:50   DG Chest Port 1 View  Result Date: 12/17/2020 CLINICAL DATA:  Questionable sepsis.  Weakness EXAM: PORTABLE CHEST 1 VIEW COMPARISON:  03/09/2020 FINDINGS: Left subclavian approached chest port remains in place. Stable cardiomegaly. Chronic left basilar scarring or atelectasis. Peripheral left upper lobe nodule better seen on prior CT. No new airspace consolidation. Pleural effusion or pneumothorax. IMPRESSION: Chronic left basilar scarring or atelectasis. No new or acute findings. Electronically Signed   By: Davina Poke D.O.   On: 12/17/2020 13:20   ECHOCARDIOGRAM COMPLETE  Result Date: 12/21/2020    ECHOCARDIOGRAM REPORT   Patient Name:   Scott Jordan Date of Exam: 12/21/2020 Medical Rec #:  [id number]    Height:       72.0 in  Accession #:    [id number]   Weight:       137.1 lb Date of Birth:  [birth year]     BSA:          1.815 m Patient Age:    89 years     BP:           114/80 mmHg Patient Gender: M            HR:           89 bpm. Exam Location:  [facility] Procedure: 2D Echo, Color Doppler and Cardiac Doppler Indications:     Endocarditis I38  History:          Patient has prior history of Echocardiogram examinations, most                  recent 08/19/2019. Arrythmias:Atrial Fibrillation; Risk                  Factors:Hypertension. Substance abuse.  Sonographer:     Sherrie Sport Referring Phys:  [place] Diagnosing Phys: Serafina Royals MD IMPRESSIONS  1. Left ventricular ejection fraction, by estimation, is 60 to 65%. The left ventricle has normal function. The left ventricle has no regional wall motion abnormalities. Left ventricular diastolic parameters were normal.  2. Right ventricular systolic function is normal. The right ventricular size is normal.  3. The mitral valve is normal in structure. Trivial mitral valve regurgitation.  4. The aortic valve is normal in structure. Aortic valve regurgitation is not visualized. Mild aortic valve sclerosis is present, with no evidence of aortic valve stenosis. Conclusion(s)/Recommendation(s): No evidence of valve vegetations. FINDINGS  Left Ventricle: Left ventricular ejection fraction, by estimation, is 60 to 65%. The left ventricle has normal function. The left ventricle has no regional wall motion abnormalities. The left ventricular internal cavity size was small. There is no left ventricular hypertrophy. Left ventricular diastolic parameters were normal. Right Ventricle: The right ventricular size is normal. No increase in right ventricular wall thickness. Right ventricular systolic function is normal. Left Atrium: Left atrial size was normal in size. Right Atrium: Right atrial size was normal in size. Pericardium: There is no evidence of pericardial effusion. Mitral Valve: The mitral valve is normal in structure. Trivial mitral valve regurgitation. Tricuspid Valve: The tricuspid valve is normal in structure. Tricuspid valve regurgitation is trivial. Aortic Valve: The aortic valve is normal in structure. Aortic valve regurgitation is not visualized. Mild aortic valve sclerosis is present, with no evidence of aortic  valve stenosis. Aortic valve mean gradient measures 3.0 mmHg. Aortic valve peak gradient measures 5.9 mmHg. Aortic valve area, by VTI measures 2.30 cm. Pulmonic Valve: The pulmonic valve was normal in structure. Pulmonic valve regurgitation is not visualized. Aorta: The aortic root and ascending aorta are structurally normal, with no evidence of dilitation. IAS/Shunts: No atrial level shunt detected by color flow Doppler.  LEFT VENTRICLE PLAX 2D LVIDd:         3.87 cm LVIDs:         2.30 cm LV PW:         1.00 cm LV IVS:        1.18 cm LVOT diam:     2.10 cm LV SV:         44 LV SV Index:   24 LVOT Area:     3.46 cm  RIGHT VENTRICLE RV Basal diam:  3.70 cm RV S prime:     12.40 cm/s TAPSE (M-mode):  3.7 cm LEFT ATRIUM           Index       RIGHT ATRIUM           Index LA diam:      3.50 cm 1.93 cm/m  RA Area:     33.70 cm LA Vol (A2C): 57.4 ml 31.63 ml/m RA Volume:   128.00 ml 70.53 ml/m LA Vol (A4C): 72.1 ml 39.73 ml/m  AORTIC VALVE                   PULMONIC VALVE AV Area (Vmax):    2.34 cm    PV Vmax:        0.60 m/s AV Area (Vmean):   2.32 cm    PV Peak grad:   1.4 mmHg AV Area (VTI):     2.30 cm    RVOT Peak grad: 2 mmHg AV Vmax:           121.00 cm/s AV Vmean:          86.000 cm/s AV VTI:            0.193 m AV Peak Grad:      5.9 mmHg AV Mean Grad:      3.0 mmHg LVOT Vmax:         81.60 cm/s LVOT Vmean:        57.600 cm/s LVOT VTI:          0.128 m LVOT/AV VTI ratio: 0.66  AORTA Ao Root diam: 3.50 cm MITRAL VALVE               TRICUSPID VALVE MV Area (PHT): 4.52 cm    TR Peak grad:   24.4 mmHg MV Decel Time: 168 msec    TR Vmax:        247.00 cm/s MV E velocity: 88.30 cm/s                            SHUNTS                            Systemic VTI:  0.13 m                            Systemic Diam: 2.10 cm Serafina Royals MD Electronically signed by Serafina Royals MD Signature Date/Time: 12/21/2020/5:20:09 PM    Final    US Abdomen Limited RUQ (LIVER/GB)  Result Date: 12/18/2020 CLINICAL DATA:   Evaluate for cholecystitis. EXAM: ULTRASOUND ABDOMEN LIMITED RIGHT UPPER QUADRANT COMPARISON:  CT AP from 12/17/2020 FINDINGS: Gallbladder: The gallbladder wall is diffusely edematous measuring up to 13.1 mm. Gallstones are noted measuring up to 5.9 mm. Pericholecystic fluid. Positive sonographic Murphy's sign. Common bile duct: Diameter: 6 mm Liver: Diffusely increased parenchymal echogenicity identified. No focal liver abnormality. Portal vein is patent on color Doppler imaging with normal direction of blood flow towards the liver. Other: 1.9 cm right kidney cyst. IMPRESSION: Gallstones, gallbladder wall thickening and pericholecystic fluid with positive sonographic Murphy's sign. Findings are compatible with acute cholecystitis. Electronically Signed   By: Kerby Moors M.D.   On: 12/18/2020 14:18    Labs:  CBC: Recent Labs    12/22/20 0513 12/23/20 0305 12/24/20 0506 12/25/20 0554  WBC 9.7 11.1* 14.8* 17.2*  HGB 9.1* 9.0* 10.2* 10.1*  HCT 25.0* 26.0* 28.4* 29.3*  PLT 51*  83* 133* 198    COAGS: Recent Labs    12/17/20 1205 12/18/20 0500  INR 1.5* 1.6*  APTT 51*  --     BMP: Recent Labs    12/30/19 0842 01/06/20 0827 01/27/20 0906 12/22/20 0513 12/23/20 0305 12/24/20 0506 12/25/20 0554  NA 130* 133*   < > 141 141 142 141  K 3.7 3.6   < > 3.5 3.0* 4.1 3.5  CL 96* 98   < > 110 109 112* 112*  CO2 24 26   < > 23 24 23 24   GLUCOSE 126* 129*   < > 115* 90 94 105*  BUN 8 8   < > 21 17 14 14   CALCIUM 8.5* 8.9   < > 7.9* 7.6* 7.7* 7.7*  CREATININE 0.62 0.62   < > 0.59* 0.69 0.66 0.71  GFRNONAA >60 >60   < > >60 >60 >60 >60  GFRAA >60 >60  --   --   --   --   --    < > = values in this interval not displayed.    LIVER FUNCTION TESTS: Recent Labs    12/08/20 0818 12/17/20 1205 12/24/20 0506 12/25/20 0554  BILITOT 1.0 1.8* 0.9 1.0  AST 15 51* 28 23  ALT 10 32 78* 63*  ALKPHOS 78 50 61 60  PROT 7.4 6.3* 5.2* 5.4*  ALBUMIN 3.9 3.1* 2.1* 2.0*    TUMOR MARKERS: No  results for input(s): AFPTM, CEA, CA199, CHROMGRNA in the last 8760 hours.  Assessment and Plan:  Malnutrition secondary to cancer = will proceed with placement of gastrostomy tube on Monday by Dr. Pascal Lux.  Risks and benefits image guided gastrostomy tube placement was discussed with the patient including, but not limited to the need for a barium enema during the procedure, bleeding, infection, peritonitis and/or damage to adjacent structures.  All of the patient's questions were answered, patient is agreeable to proceed.  Consent signed and in chart.  Anticoagulated secondary to A-fib with possible thromboembolic stroke.  Eliquis held and bridging with Heparin Drip = Heparin drip will be stopped 2 hours prior to procedure on Monday.  Acute Cholecystitis - S/P Perc chole by Dr. Laurence Ferrari on 12/20/20.  Continue routine drain care with flushes as ordered. This drain will need to remain x 6 weeks. At that time he can have a cholangiogram to evaluate for cystic duct patency. If the duct is patent, consider capping the drain.   *Given Mr. Vitug current medical issues, doubt he would ever be a candidate for cholecystectomy. He will need to undergo routine perc chole drain exchanges in IR about every 10 weeks as outpatient.   Electronically Signed: Murrell Redden, PA-C   12/25/2020, 11:32 AM      I spent a total of  25 Minutes in face to face in clinical consultation, greater than 50% of which was counseling/coordinating care for f/u perc chole and placement of gastrostomy tube.

## 2020-12-25 NOTE — Progress Notes (Signed)
Nutrition Follow-up  DOCUMENTATION CODES:   Severe malnutrition in context of chronic illness  INTERVENTION:   Once G-tube in place and ready for use, recommend:  Osmolite 1.5@65ml /hr- Initiate at 74m/hr and increase by 127mhr q 8 hours until goal rate is reached.   Free water flushes 5054m4 hours   Regimen provides 2340kcal/day, 98g/day protein and 1489m43my free water   Pt at high refeed risk; recommend monitor potassium, magnesium and phosphorus labs daily until stable  NUTRITION DIAGNOSIS:   Severe Malnutrition related to cancer and cancer related treatments as evidenced by severe fat depletion, severe muscle depletion. -new diagnosis   GOAL:   Patient will meet greater than or equal to 90% of their needs -not met   MONITOR:   PO intake, Supplement acceptance, Labs, Weight trends, Skin, I & O's  ASSESSMENT:   70 y61r old male with PMH of 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 fib not on AC, chronic hep C, chronic cancer pain, HTN, alcohol abuse, malnutrition and polysubstance who is admitted with neutropenic sepsis due to acute cholecystitis and Klebsiella bacteremia now s/p cholecystostomy on 9/11 by IR. Hospital admission complicated by acute thromboembolic stroke with new right-sided weakness-confirmed on MRI brain on 9/11.  Pt and family have decided to proceed with G-tube placement; pt being evaluated by IR today with plans for possible placement on Monday. Pt is at high refeed risk and will need to remain in hospital until his refeed labs are stable. Will continue supplements for now.   Medications reviewed and include: marinol, folic acid, MVI, thiamine, ceftriaxone, metronidazole   Labs reviewed: K 3.5 wnl, P 2.8 wnl, Mg 1.6(L) Wbc- 17.2(H), Hgb 10.1(L), Hct 29.3(L)  Diet Order:   Diet Order             DIET DYS 3 Room service appropriate? Yes with Assist; Fluid consistency: Nectar Thick; Fluid restriction:  2000 mL Fluid  Diet effective now                  EDUCATION NEEDS:   Education needs have been addressed  Skin:  Skin Assessment: Skin Integrity Issues: Skin Integrity Issues:: DTI DTI: R heel; L heel  Last BM:  9/15- type 7  Height:   Ht Readings from Last 1 Encounters:  12/18/20 6' (1.829 m)    Weight:   Wt Readings from Last 1 Encounters:  12/24/20 70.8 kg    Ideal Body Weight:  80.9 kg  BMI:  Body mass index is 21.17 kg/m.  Estimated Nutritional Needs:   Kcal:  2000-2300kcal/day  Protein:  100-115g/day  Fluid:  1.6-1.9L/day  CaseKoleen Distance RD, LDN Please refer [facility] and/or RD on-call/weekend/after hours pager

## 2020-12-25 NOTE — Procedures (Signed)
PROCEDURE SUMMARY:  Successful US guided left thoracentesis. Yielded 700 mL of amber fluid. Patient tolerated procedure well. No immediate complications. EBL = trace  Specimen was sent for labs.  Post procedure chest X-ray ordered.  Keyuna Cuthrell S Benoit Meech PA-C 12/25/2020 4:21 PM

## 2020-12-25 NOTE — Progress Notes (Signed)
Chaplain Maggie made brief visit to introduce pastoral care. Pt declined conversation because he preferred to nap.

## 2020-12-25 NOTE — Progress Notes (Signed)
Physical Therapy Treatment Patient Details Name: Scott Jordan MRN: [id number] DOB: 12/16/1950 Today's Date: 12/25/2020   History of Present Illness Pt is a 70 y.o. M arriving to ED for c/o nausea, vomiting and admitted for neutropenic sepsis,severe neutropenia, cholecystitis. PMH includes stage IV small cell carcinoma, anemia, thrombocytopenia, hx of substance abuse, chronic hepatitis C, HTN., a-fib. During hospitalization has been complicated by acute CVA with residual right-sided weakness. During hospitalization multifocal acute embolic strokes occured in bilateral cerebral hemisphere & L cerebellum.    PT Comments    Pt fatigued, in bed with brother present throughout treatment. Pt stated not feeling well denying PT. Pt education provided on importance of mobilization and pt agreeable to sit EOB. Pt required MOD A for supine <> sit transfers for trunk (supine > sit), and BLE (sit > supine). Pt is able to participate w./ scooting to [facility] with lifting hips w/ BLE momentarily. Pt able to sit w/ fair balance for a few seconds, but impulsively laid trunk down back to bed due to "not feeling well." Heart rate below 120 bpm after activity and quickly decreased to 100 w/ activity cessation. Skilled PT intervention is indicated to address deficits in function, mobility, and to return to PLOF as able.  SNF recommended at discharge.   Recommendations for follow up therapy are one component of a multi-disciplinary discharge planning process, led by the attending physician.  Recommendations may be updated based on patient status, additional functional criteria and insurance authorization.  Follow Up Recommendations  SNF;Supervision/Assistance - 24 hour     Equipment Recommendations  Other (comment) (TBD next venue of care)    Recommendations for Other Services       Precautions / Restrictions Precautions Precautions: Fall Restrictions Weight Bearing Restrictions: No     Mobility  Bed  Mobility Overal bed mobility: Needs Assistance Bed Mobility: Supine to Sit     Supine to sit: Mod assist Sit to supine: Mod assist   General bed mobility comments: Trunk support, supine > sit BLE; pt able to lift bottom w/ BLE momentarily for scooting to [facility]    Transfers                 General transfer comment: deferred  Ambulation/Gait                 Stairs             Wheelchair Mobility    Modified Rankin (Stroke Patients Only)       Balance Overall balance assessment: Needs assistance Sitting-balance support: Feet supported;Bilateral upper extremity supported Sitting balance-Leahy Scale: Fair Sitting balance - Comments: rests arms on legs due to malaise, able to support trunk w/ BUE when cued for upright sitting       Standing balance comment: deferred                            Cognition                                              Exercises      General Comments        Pertinent Vitals/Pain Pain Assessment: Faces Faces Pain Scale: Hurts even more Pain Location: General malaise Pain Descriptors / Indicators: Aching Pain Intervention(s): Limited activity within patient's tolerance;Monitored during session;Repositioned    Home  Living                      Prior Function            PT Goals (current goals can now be found in the care plan section) Progress towards PT goals: Progressing toward goals    Frequency    Min 2X/week      PT Plan Current plan remains appropriate    Co-evaluation              AM-PAC PT "6 Clicks" Mobility   Outcome Measure  Help needed turning from your back to your side while in a flat bed without using bedrails?: A Lot Help needed moving from lying on your back to sitting on the side of a flat bed without using bedrails?: A Lot Help needed moving to and from a bed to a chair (including a wheelchair)?: Total Help needed standing up from a  chair using your arms (e.g., wheelchair or bedside chair)?: Total Help needed to walk in hospital room?: Total Help needed climbing 3-5 steps with a railing? : Total 6 Click Score: 8    End of Session Equipment Utilized During Treatment: Gait belt Activity Tolerance: Patient limited by fatigue;Patient limited by pain Patient left: in bed;with SCD's reapplied;with call bell/phone within reach;with bed alarm set;with family/visitor present Nurse Communication: Mobility status PT Visit Diagnosis: Repeated falls (R29.6);Muscle weakness (generalized) (M62.81);Difficulty in walking, not elsewhere classified (R26.2);History of falling (Z91.81)     Time: 5110-2111 PT Time Calculation (min) (ACUTE ONLY): 21 min  Charges:                        [employer], SPT

## 2020-12-25 NOTE — Progress Notes (Signed)
Hematology/Oncology Progress Note [facility] [phone] [phone]  Patient Care Team: Earlie Server, MD as PCP - General (Oncology) Telford Nab, RN as Registered Nurse Erby Pian, MD as Referring Physician (Specialist) Teodoro Spray, MD as Consulting Physician (Cardiology) Anthonette Legato, MD as Consulting Physician (Nephrology) Vladimir Crofts, MD as Consulting Physician (Neurology) Meade Maw, MD as Consulting Physician (Neurosurgery)   Name of the patient: Scott Jordan  [id number]  08-14-1950  Date of visit: 12/25/20   INTERVAL HISTORY-  S/p cholecystostomy New onset of right upper extremity weakness 12/20/2020 MRI brain w wo contrast - multiple infarcts.  12/21/2020 CT angiogram head and neck showed advanced chronic microvascular ischemic changes of the white matter.  Atherosclerosis disease of the bilateral carotid bifurcation, bilateral vertebral arteries without hemodynamically significant stenosis.  Mild intracranial disease without stenosis. 12/21/2020 echocardiogram showed LVEF 60-65%. No vegetation. 12/21/20 I Have discussed and update patient's power of attorney [name] over the phone.  12/24/20 , I had a phone conversation with POA Tasha and discussed about patient's poor prognosis.  Tasha further discussed with patient and requested PEG tube placement.  12/25/2020 MRI brain postcontrast imaging was done.  There is no abnormal enhancement.  No intracranial metastasis.  12/25/2020, patient has very poor appetite and p.o. intake.  Severe protein calorie malnutrition.  Hypoxia.  Pulmonology is consulted. Patient was seen at bedside.  When being asked about his breathing, he says " a bit shortness of breath". denies any pain.  On nasal cannula oxygen.  No Known Allergies  Patient Active Problem List   Diagnosis Date Noted   Cerebrovascular accident (CVA) due to embolism of precerebral artery ([place])    Palliative  care encounter    Cholelithiasis with acute cholecystitis 12/20/2020   Cholecystitis    Septicemia due to Klebsiella pneumoniae ([place]) 12/18/2020   Neutropenic sepsis ([place]) 12/17/2020   Head trauma 06/24/2020   Ulnar neuropathy of left upper extremity 05/13/2020   Cervical radiculopathy 05/13/2020   B12 deficiency 04/06/2020   Weight loss 03/23/2020   Neuropathy of left upper extremity 03/22/2020   Hepatitis B infection without delta agent without hepatic coma 12/30/2019   Chronic hepatitis C without hepatic coma ([place]) 12/30/2019   Chronic anticoagulation 12/23/2019   Elevated LFTs 12/23/2019   Hypokalemia 12/08/2019   Acute respiratory failure with hypoxia ([place]) 11/23/2019   Heroin overdose, accidental or unintentional, initial encounter ([place]) 11/23/2019   Aspiration pneumonia ([place]) 11/23/2019   Pancytopenia ([place]) 11/23/2019   Atypical atrial flutter ([place]) 11/23/2019   Hypomagnesemia 10/21/2019   Antineoplastic chemotherapy induced anemia 10/13/2019   Dehydration 10/13/2019   Thrombocytopenia ([place]) 10/13/2019   Chemotherapy-induced neutropenia ([place]) 10/10/2019   Anemia 09/11/2019   Cancer-related pain 09/05/2019   Encounter for antineoplastic chemotherapy 09/02/2019   Encounter for antineoplastic immunotherapy 09/02/2019   Bone metastasis ([place]) 09/02/2019   Goals of care, counseling/discussion 08/26/2019   Small cell lung cancer ([place]) 08/23/2019   Protein-calorie malnutrition, severe 08/19/2019   Hyponatremia    Dyspnea    Pleural effusion    Recurrent pleural effusion on left    Malignant pleural effusion 08/17/2019   Alcohol abuse 07/21/2019   History of substance abuse ([place]) 06/18/2019   Hypertension, essential 06/18/2019   Malnutrition of moderate degree 01/15/2019   Elevated troponin 01/14/2019     Past Medical History:  Diagnosis Date   A-fib ([place]) 01/10/2019   pt st this was dx by Dr. Ubaldo Glassing   Cancer [facility] [place]) 2021   LUNG  Complication of anesthesia     DIFFICULTY WAKING UP AFTER SURGERY- 20 YRS AGO   Hypertension    Lung cancer ([place])    Substance abuse ([place])      Past Surgical History:  Procedure Laterality Date   ARM DEBRIDEMENT Left    INCISION AND DEBRIDEMENT LOWER ARM -20 YRS AGO   BACK SURGERY     IR PERC CHOLECYSTOSTOMY  12/20/2020   PORTACATH PLACEMENT Left 08/29/2019   Procedure: INSERTION PORT-A-CATH;  Surgeon: Nestor Lewandowsky, MD;  Location: [facility] ORS;  Service: General;  Laterality: Left;    Social History   Socioeconomic History   Marital status: Single    Spouse name: Not on file   Number of children: Not on file   Years of education: Not on file   Highest education level: Not on file  Occupational History   Not on file  Tobacco Use   Smoking status: Former    Packs/day: 0.50    Types: Cigarettes   Smokeless tobacco: Never   Tobacco comments:    not smoked since 6 weeks ago  Vaping Use   Vaping Use: Never used  Substance and Sexual Activity   Alcohol use: Yes    Comment: 76 OZ IN WEEK   Drug use: Not Currently    Types: Heroin    Comment: heroin WITHIN PAST YEAR   Sexual activity: Not on file  Other Topics Concern   Not on file  Social History Narrative   Not on file   Social Determinants of Health   Financial Resource Strain: Not on file  Food Insecurity: Not on file  Transportation Needs: Not on file  Physical Activity: Not on file  Stress: Not on file  Social Connections: Not on file  Intimate Partner Violence: Not on file     History reviewed. No pertinent family history.   Current Facility-Administered Medications:    0.9 %  sodium chloride infusion (Manually program via Guardrails IV Fluids), , Intravenous, Once, Sharen Hones, MD   0.9 %  sodium chloride infusion, , Intravenous, PRN, Sharen Hones, MD, Last Rate: 10 mL/hr at 12/25/20 0318, Infusion Verify at 12/25/20 0318   0.9 %  sodium chloride infusion, , , Continuous PRN, Criselda Peaches, MD, Last Rate: 10 mL/hr at 12/20/20 1220,  10 mL/hr at 12/20/20 1220   acetaminophen (TYLENOL) tablet 650 mg, 650 mg, Oral, Q6H PRN **OR** acetaminophen (TYLENOL) suppository 650 mg, 650 mg, Rectal, Q6H PRN, Cox, Amy N, DO   cefTRIAXone (ROCEPHIN) 2 g in sodium chloride 0.9 % 100 mL IVPB, 2 g, Intravenous, Q24H, Sharen Hones, MD, Last Rate: 200 mL/hr at 12/25/20 0551, 2 g at 12/25/20 0551   chlorhexidine (PERIDEX) 0.12 % solution 10 mL, 10 mL, Mouth/Throat, BID, Earlie Server, MD, 10 mL at 12/24/20 2017   Chlorhexidine Gluconate Cloth 2 % PADS 6 each, 6 each, Topical, Daily, Cox, Amy N, DO, 6 each at 12/25/20 1004   diltiazem (CARDIZEM CD) 24 hr capsule 240 mg, 240 mg, Oral, Daily, Cox, Amy N, DO, 240 mg at 12/25/20 0646   diltiazem (CARDIZEM) tablet 30 mg, 30 mg, Oral, Q6H PRN, Cyndia Skeeters, Taye T, MD   dronabinol (MARINOL) capsule 2.5 mg, 2.5 mg, Oral, BID AC, Sharen Hones, MD, 2.5 mg at 87/56/43 3295   folic acid (FOLVITE) tablet 1 mg, 1 mg, Oral, Daily, Sharen Hones, MD, 1 mg at 12/25/20 0959   guaiFENesin (MUCINEX) 12 hr tablet 1,200 mg, 1,200 mg, Oral, BID, Dorothe Pea, RPH, 1,200  mg at 12/25/20 0647   magnesium sulfate IVPB 2 g 50 mL, 2 g, Intravenous, Once, Gonfa, Taye T, MD   metroNIDAZOLE (FLAGYL) IVPB 500 mg, 500 mg, Intravenous, Q12H, Max Sane, MD, Last Rate: 100 mL/hr at 12/25/20 1002, 500 mg at 12/25/20 1002   multivitamin with minerals tablet 1 tablet, 1 tablet, Oral, Daily, Sharen Hones, MD, 1 tablet at 12/25/20 0959   ondansetron (ZOFRAN) tablet 4 mg, 4 mg, Oral, Q6H PRN **OR** ondansetron (ZOFRAN) injection 4 mg, 4 mg, Intravenous, Q6H PRN, Cox, Amy N, DO   oxyCODONE-acetaminophen (PERCOCET/ROXICET) 5-325 MG per tablet 1 tablet, 1 tablet, Oral, Q6H PRN, Mansy, Jan A, MD, 1 tablet at 12/24/20 0944   sodium chloride flush (NS) 0.9 % injection 10-40 mL, 10-40 mL, Intracatheter, Q12H, Manuella Ghazi, Vipul, MD, 10 mL at 12/25/20 1004   sodium chloride flush (NS) 0.9 % injection 10-40 mL, 10-40 mL, Intracatheter, PRN, Manuella Ghazi, Vipul, MD    sodium chloride flush (NS) 0.9 % injection 5 mL, 5 mL, Intracatheter, Q8H, Jacqulynn Cadet K, MD, 5 mL at 12/25/20 0602   thiamine tablet 100 mg, 100 mg, Oral, Daily, Sharen Hones, MD, 100 mg at 12/25/20 1962  Facility-Administered Medications Ordered in Other Encounters:    sodium chloride flush (NS) 0.9 % injection 10 mL, 10 mL, Intravenous, PRN, Nolon Stalls C, MD, 10 mL at 11/11/19 0812   Physical exam:  Vitals:   12/25/20 0634 12/25/20 0702 12/25/20 0830 12/25/20 1039  BP: (!) 138/100 (!) 144/93 (!) 137/92 131/89  Pulse: (!) 128 98 (!) 113 (!) 105  Resp: 20 20 20  (!) 22  Temp: 98 F (36.7 C) 98 F (36.7 C) 98 F (36.7 C) (!) 97.5 F (36.4 C)  TempSrc:   Oral Oral  SpO2: 98% 99% 98% 96%  Weight:      Height:       Physical Exam Constitutional:      General: He is not in acute distress.    Appearance: He is not diaphoretic.  HENT:     Head: Normocephalic and atraumatic.     Nose: Nose normal.     Mouth/Throat:     Pharynx: No oropharyngeal exudate.  Eyes:     General: No scleral icterus.    Pupils: Pupils are equal, round, and reactive to light.  Cardiovascular:     Rate and Rhythm: Normal rate and regular rhythm.     Heart sounds: No murmur heard. Pulmonary:     Effort: Pulmonary effort is normal. No respiratory distress.     Breath sounds: No rales.  Chest:     Chest wall: No tenderness.  Abdominal:     General: There is no distension.     Palpations: Abdomen is soft.     Tenderness: There is no abdominal tenderness.     Comments: cholecystostomy tube  Musculoskeletal:        General: Normal range of motion.     Cervical back: Normal range of motion and neck supple.  Skin:    General: Skin is warm and dry.     Findings: No erythema.  Neurological:     Mental Status: He is alert. Mental status is at baseline.     Cranial Nerves: No cranial nerve deficit.     Motor: No abnormal muscle tone.     Comments: Decreased right upper extremity strength.   3-4/5  Psychiatric:        Mood and Affect: Affect normal.       CMP Latest  Ref Rng & Units 12/25/2020  Glucose 70 - 99 mg/dL 105(H)  BUN 8 - 23 mg/dL 14  Creatinine 0.61 - 1.24 mg/dL 0.71  Sodium 135 - 145 mmol/L 141  Potassium 3.5 - 5.1 mmol/L 3.5  Chloride 98 - 111 mmol/L 112(H)  CO2 22 - 32 mmol/L 24  Calcium 8.9 - 10.3 mg/dL 7.7(L)  Total Protein 6.5 - 8.1 g/dL 5.4(L)  Total Bilirubin 0.3 - 1.2 mg/dL 1.0  Alkaline Phos 38 - 126 U/L 60  AST 15 - 41 U/L 23  ALT 0 - 44 U/L 63(H)   CBC Latest Ref Rng & Units 12/25/2020  WBC 4.0 - 10.5 K/uL 17.2(H)  Hemoglobin 13.0 - 17.0 g/dL 10.1(L)  Hematocrit 39.0 - 52.0 % 29.3(L)  Platelets 150 - 400 K/uL 198    RADIOGRAPHIC STUDIES: I have personally reviewed the radiological images as listed and agreed with the findings in the report. CT ABDOMEN PELVIS WO CONTRAST  Result Date: 12/17/2020 CLINICAL DATA:  Nausea and vomiting EXAM: CT ABDOMEN AND PELVIS WITHOUT CONTRAST TECHNIQUE: Multidetector CT imaging of the abdomen and pelvis was performed following the standard protocol without IV contrast. COMPARISON:  CT 06/23/2020, PET CT 11/19/2020 FINDINGS: Lower chest: Lung bases demonstrate emphysema. Subpleural bandlike airspace disease at the left lower lobe redemonstrated. Incompletely visualized subpleural peripheral left upper lobe nodule measuring 11 mm, series 5, image 1. Cardiomegaly with trace pericardial effusion. Coronary vascular calcification. Hepatobiliary: Extensive motion degradation. No focal hepatic abnormality. Gallstones. Gallbladder slightly distended. Possible wall thickening or pericholecystic fluid though limited by motion Pancreas: Unremarkable. No pancreatic ductal dilatation or surrounding inflammatory changes. Spleen: Normal in size without focal abnormality. Adrenals/Urinary Tract: Thickened appearance of adrenal glands. No hydronephrosis. The bladder is unremarkable Stomach/Bowel: Stomach nonenlarged. No dilated small  bowel. Negative appendix. No acute bowel wall thickening Vascular/Lymphatic: Advanced aortic atherosclerosis. No aneurysm. Slightly increased multiple small retroperitoneal lymph nodes. Reproductive: Prostate is unremarkable. Other: Negative for free air or free fluid Musculoskeletal: No acute osseous abnormality. IMPRESSION: 1. Degraded by motion. 2. Gallbladder appears slightly distended and there are gallstones. Possible gallbladder wall thickening or pericholecystic fluid though limited by motion, consider correlation with ultrasound 3. Cardiomegaly with trace pericardial effusion. 4. Similar subpleural bandlike density at left lower lobe. Incompletely visualized peripheral left upper lobe lung nodule characterized on recent PET CT Electronically Signed   By: Donavan Foil M.D.   On: 12/17/2020 16:50   CT ANGIO HEAD NECK W WO CM  Result Date: 12/21/2020 CLINICAL DATA:  Neuro deficit, acute, stroke suspected. Neutropenic sepsis. EXAM: CT ANGIOGRAPHY HEAD AND NECK TECHNIQUE: Multidetector CT imaging of the head and neck was performed using the standard protocol during bolus administration of intravenous contrast. Multiplanar CT image reconstructions and MIPs were obtained to evaluate the vascular anatomy. Carotid stenosis measurements (when applicable) are obtained utilizing NASCET criteria, using the distal internal carotid diameter as the denominator. CONTRAST:  68mL OMNIPAQUE IOHEXOL 350 MG/ML SOLN COMPARISON:  MRI of the brain December 20, 2020. FINDINGS: CT HEAD FINDINGS Brain: Confluent hypodensity of the white matter of the cerebral hemispheres, nonspecific, most likely related to chronic small vessel ischemia. Foci of acute infarct are more conspicuous on recent MRI of the brain. No new large territorial infarct. No hemorrhage, hydrocephalus, mass or midline shift. Vascular: No hyperdense vessel or unexpected calcification. Skull: Normal. Negative for fracture or focal lesion. Sinuses: Increased  thickness of the walls of the bilateral maxillary sinuses with bubbly secretion on the left. Findings are suggestive of chronic sinusitis.  Right mastoid effusion. Orbits: No acute finding. Review of the MIP images confirms the above findings CTA NECK FINDINGS Aortic arch: Standard branching. Imaged portion shows no evidence of aneurysm or dissection. Mild atherosclerotic changes of the aortic arch without significant stenosis of the major arch vessel origins. Right carotid system: Calcified atherosclerotic disease of the right carotid bifurcation without hemodynamically significant stenosis. Left carotid system: Atherosclerotic disease of the left carotid bifurcation with mixed density plaques without hemodynamically significant stenosis. Vertebral arteries: Calcified atherosclerotic plaque at the origin of the right vertebral artery without hemodynamically significant stenosis. Mild atherosclerotic changes at the origin of the left vertebral artery without hemodynamically significant stenosis. Cervical vertebral arteries otherwise have normal caliber Skeleton: Degenerative changes of the cervical spine with fusion of the facet joints at C2-3 on the left. No acute findings. Other neck: Negative. Upper chest: Left-sided pleural effusion and atelectasis. Mediastinal lymphadenopathy. Review of the MIP images confirms the above findings CTA HEAD FINDINGS Anterior circulation: Calcified atherosclerotic plaques in the bilateral carotid siphons. No significant stenosis, proximal occlusion, aneurysm, or vascular malformation. Posterior circulation: No significant stenosis, proximal occlusion, aneurysm, or vascular malformation. Hypoplastic/aplastic right P1/PCA segment with right fetal PCA. Venous sinuses: As permitted by contrast timing, patent. Anatomic variants: Right fetal PCA. Review of the MIP images confirms the above findings IMPRESSION: 1. Advanced chronic microvascular ischemic changes of the white matter. 2.  Atherosclerotic disease of the bilateral carotid bifurcation without hemodynamically significant stenosis. 3. Mild atherosclerotic changes at the origin of the bilateral vertebral arteries without hemodynamically significant stenosis. 4. Mild intracranial atherosclerotic disease without hemodynamically significant stenosis. Electronically Signed   By: Pedro Earls M.D.   On: 12/21/2020 15:32   CT HEAD WO CONTRAST (5MM)  Result Date: 12/17/2020 CLINICAL DATA:  Nausea vomiting EXAM: CT HEAD WITHOUT CONTRAST TECHNIQUE: Contiguous axial images were obtained from the base of the skull through the vertex without intravenous contrast. COMPARISON:  CT brain 06/24/2020, MRI 03/10/2020, head CT 11/22/2019 FINDINGS: Brain: No acute territorial infarction, hemorrhage or intracranial mass. Extensive white matter disease, progressed from more remote exams from 2021. Stable ventricle size. Chronic lacunar infarcts within the left white matter. Vascular: No hyperdense vessel.  Carotid vascular calcification Skull: Normal. Negative for fracture or focal lesion. Sinuses/Orbits: Mucosal thickening the sinuses. Chronic appearing nasal deformity Other: None IMPRESSION: 1. No definite CT evidence for acute intracranial abnormality. 2. Atrophy. Extensive white matter disease, grossly stable as compared with recent head CT from March, appears progressive when compared to exams from 2021 and prior Electronically Signed   By: Donavan Foil M.D.   On: 12/17/2020 16:39   MR BRAIN W WO CONTRAST  Addendum Date: 12/21/2020   ADDENDUM REPORT: 12/21/2020 14:49 ADDENDUM: Patient returns for repeat postcontrast imaging. There is no abnormal enhancement. No evidence of intracranial metastatic disease. Electronically Signed   By: Macy Mis M.D.   On: 12/21/2020 14:49   Result Date: 12/21/2020 CLINICAL DATA:  Neuro deficit, acute, stroke suspected Small cell lung cancer, monitor EXAM: MRI HEAD WITHOUT AND WITH CONTRAST  TECHNIQUE: Multiplanar, multiecho pulse sequences of the brain and surrounding structures were obtained without and with intravenous contrast. CONTRAST:  7.12mL GADAVIST GADOBUTROL 1 MMOL/ML IV SOLN COMPARISON:  CT head 12/17/2020. FINDINGS: Mildly motion limited exam.  Within this limitation: Brain: Multiple small areas of restricted diffusion in the bilateral frontal and occipital, and left parietal matter, left perirolandic cortex, left thalamocapsular region, and left cerebellum. Advanced confluent T2 hyperintensity within the white matter, nonspecific but  compatible with chronic microvascular ischemic disease and progressed since 2021. Small remote infarct in the left corona radiata. No hydrocephalus, obvious mass lesion, midline shift, acute hemorrhage, or extra-axial fluid collection. Punctate foci of susceptibility artifact in the left cerebellum and left frontal white matter, nonspecific but potentially related to prior microhemorrhage. Contrast was administered; however, there is no evidence of enhancement (for example, the nasal mucosa/turbinates are nonenhancing). This precludes evaluation for enhancing lesions. Vascular: Major arterial flow voids are maintained at the skull base. Skull and upper cervical spine: Normal marrow signal. Sinuses/Orbits: Mild paranasal sinus disease. Other: Moderate right and small left mastoid effusions. IMPRESSION: 1. Multiple small areas of restricted diffusion in the bilateral frontal and occipital and left parietal white matter, left perirolandic cortex, left thalamocapsular region, and left cerebellum, which are concerning for acute infarcts. Given involvement of multiple vascular territories, consider a central embolic etiology. 2. Contrast was administered; however, there is no evidence of enhancement, unclear why given reported uneventful injection per the technologist. This precludes evaluation for enhancing lesions. Given the patient's known small cell lung cancer  diagnosis, recommend repeat postcontrast imaging to exclude metastatic disease. I've asked the MRI technologist to not bill the patient for the repeat post-contrast imaging. 3. Advanced chronic microvascular ischemic disease, progressed since 2021. 4. Moderate right and small left mastoid effusions. Electronically Signed: By: Margaretha Sheffield M.D. On: 12/20/2020 14:29   IR Perc Cholecystostomy  Result Date: 12/20/2020 INDICATION: 70 year old male with small cell lung cancer and chemotherapy induced pancytopenia presenting with neutropenic sepsis. Blood cultures grew out Klebsiella and imaging findings are concerning for acute cholecystitis. He is too sick and frail to be considered an operative candidate. Following transfusion of platelets, his platelets are now at an acceptable level to proceed with percutaneous cholecystostomy tube placement. EXAM: CHOLECYSTOSTOMY MEDICATIONS: In patient currently receiving intravenous antibiotic therapy. No additional antibiotic prophylaxis administered. ANESTHESIA/SEDATION: Moderate (conscious) sedation was employed during this procedure. A total of Versed 1 mg and Fentanyl 50 mcg and 0.5 mg Dilaudid administered intravenously. Moderate Sedation Time: 13 minutes. The patient's level of consciousness and vital signs were monitored continuously by radiology nursing throughout the procedure under my direct supervision. FLUOROSCOPY TIME:  Fluoroscopy Time: 0 minutes 30 seconds (4.3 mGy). COMPLICATIONS: None immediate. PROCEDURE: Informed written consent was obtained from the patient after a thorough discussion of the procedural risks, benefits and alternatives. All questions were addressed. Maximal Sterile Barrier Technique was utilized including caps, mask, sterile gowns, sterile gloves, sterile drape, hand hygiene and skin antiseptic. A timeout was performed prior to the initiation of the procedure. The right upper quadrant was interrogated with ultrasound. The distended  gallbladder with a thickened wall was successfully identified. There is significant excursion of the liver with respiration due to use of abdominal muscles. A suitable skin entry site was selected. Local anesthesia was attained by infiltration with 1% lidocaine. A small dermatotomy was made. Under real-time ultrasound guidance, the movement of the liver was timed with respirations and in a single pass, a 21 gauge Accustick needle was advanced through the liver and into the gallbladder lumen along a short transhepatic course. A 0.018 wire was quickly advanced into the gallbladder lumen and the needle was exchanged for the transitional dilator. The transitional dilator was advanced into the gallbladder lumen. The wire was removed. Contrast injection was performed under fluoroscopy confirming opacification of the gallbladder lumen. Multiple filling defects consistent with cholelithiasis. The cystic duct is occluded. A 0.035 wire was then coiled within the gallbladder lumen.  The transitional dilator was removed. The transhepatic tract was dilated to 10 [place] with a stiff dilator. A Cook 10.2 [place] all-purpose drainage catheter was then advanced over the wire and formed in the gallbladder lumen. Images were obtained and stored for the medical record. The catheter was gently flushed and connected to gravity bag drainage before being secured to the skin with 0 silk suture. IMPRESSION: Successful placement of 10 French transhepatic percutaneous cholecystostomy tube for an indication of calculus cholecystitis. PLAN: Maintain tube to gravity bag drainage. Patient will likely need home health to assist with care and cleaning of the tube site. Return to interventional radiology approximately every 8 weeks for cholecystostomy tube check and exchange. If patient's clinical status improves in the future, elective cholecystectomy would be optimal. If patient remains a poor operative candidate, long-term cholecystostomy tube  maintenance will likely be required. Electronically Signed   By: Jacqulynn Cadet M.D.   On: 12/20/2020 13:50   DG Chest Port 1 View  Result Date: 12/17/2020 CLINICAL DATA:  Questionable sepsis.  Weakness EXAM: PORTABLE CHEST 1 VIEW COMPARISON:  03/09/2020 FINDINGS: Left subclavian approached chest port remains in place. Stable cardiomegaly. Chronic left basilar scarring or atelectasis. Peripheral left upper lobe nodule better seen on prior CT. No new airspace consolidation. Pleural effusion or pneumothorax. IMPRESSION: Chronic left basilar scarring or atelectasis. No new or acute findings. Electronically Signed   By: Davina Poke D.O.   On: 12/17/2020 13:20   DG Swallowing Func-Speech Pathology  Result Date: 12/25/2020 Table formatting from the original result was not included. Objective Swallowing Evaluation: Type of Study: MBS-Modified Barium Swallow Study  Patient Details Name: LAVAR ROSENZWEIG MRN: [id number] Date of Birth: 06/03/1950 Today's Date: 12/25/2020 Time: SLP Start Time (ACUTE ONLY): 2 -SLP Stop Time (ACUTE ONLY): 1300 SLP Time Calculation (min) (ACUTE ONLY): 30 min Past Medical History: Past Medical History: Diagnosis Date  A-fib ([place]) 01/10/2019  pt st this was dx by Dr. Ubaldo Glassing  Cancer [facility]) 1194  LUNG  Complication of anesthesia   DIFFICULTY WAKING UP AFTER SURGERY- 81 YRS AGO  Hypertension   Lung cancer ([place])   Substance abuse ([place])  Past Surgical History: Past Surgical History: Procedure Laterality Date  ARM DEBRIDEMENT Left   INCISION AND DEBRIDEMENT LOWER ARM -20 YRS AGO  BACK SURGERY    IR PERC CHOLECYSTOSTOMY  12/20/2020  PORTACATH PLACEMENT Left 08/29/2019  Procedure: INSERTION PORT-A-CATH;  Surgeon: Nestor Lewandowsky, MD;  Location: [facility] ORS;  Service: General;  Laterality: Left; HPI: Pt is a 70 year old male with PMH of 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 fib not on AC, chronic hep C, chronic cancer pain, HTN, alcohol abuse, malnutrition and  polysubstance who is admitted with neutropenic sepsis due to acute cholecystitis and Klebsiella bacteremia now s/p cholecystostomy on 9/11 by IR. Hospital admission complicated by acute thromboembolic stroke with new right-sided weakness-confirmed on MRI brain on 12/20/2020.  MRI: Multiple small areas of restricted diffusion in the bilateral  frontal and occipital and left parietal white matter, left  perirolandic cortex, left thalamocapsular region, and left  cerebellum, which are concerning for acute infarcts.  CXR at admit: Chronic left basilar scarring or atelectasis. No new or acute  findings.  Pt is followed by Palliative Care at [facility]  Subjective: pt able to communicate his wants/needs Assessment / Plan / Recommendation CHL IP CLINICAL IMPRESSIONS 12/25/2020 Clinical Impression Pt presents with adequate airway protection while consuming thin liquids, nectar thick liquids, puree and  whole barium tablet with thin liquids. Pt's oral phase is unremarkable and his swallow response is timely with good airway protection. His overall medical comorbidities and respiratory decline place him at an overall increased risk of aspiration. For example, when consuming consecutive sips of thin liquids, pt became short of breath. At this time, recommend pt continue consuming dysphagia 3 diet for energy conservation with thin liquids, medicine whole with thin liquids. Pt needs to follow strict aspiration precautions such as taking single sips and rest breaks. SLP Visit Diagnosis Dysphagia, oropharyngeal phase (R13.12) Attention and concentration deficit following -- Frontal lobe and executive function deficit following -- Impact on safety and function Mild aspiration risk;Moderate aspiration risk   CHL IP TREATMENT RECOMMENDATION 12/25/2020 Treatment Recommendations No treatment recommended at this time   Prognosis 12/24/2020 Prognosis for Safe Diet Advancement Fair Barriers to Reach Goals Motivation;Time post  onset;Severity of deficits;Behavior Barriers/Prognosis Comment -- CHL IP DIET RECOMMENDATION 12/25/2020 SLP Diet Recommendations Dysphagia 3 (Mech soft) solids;Thin liquid Liquid Administration via Cup Medication Administration Whole meds with liquid Compensations Minimize environmental distractions;Slow rate;Small sips/bites Postural Changes Remain semi-upright after after feeds/meals (Comment);Seated upright at 90 degrees   CHL IP OTHER RECOMMENDATIONS 12/25/2020 Recommended Consults -- Oral Care Recommendations Oral care BID Other Recommendations --   CHL IP FOLLOW UP RECOMMENDATIONS 12/25/2020 Follow up Recommendations None   CHL IP FREQUENCY AND DURATION 12/24/2020 Speech Therapy Frequency (ACUTE ONLY) min 3x week Treatment Duration 2 weeks      CHL IP ORAL PHASE 12/25/2020 Oral Phase WFL Oral - Pudding Teaspoon -- Oral - Pudding Cup -- Oral - Honey Teaspoon -- Oral - Honey Cup -- Oral - Nectar Teaspoon -- Oral - Nectar Cup -- Oral - Nectar Straw -- Oral - Thin Teaspoon -- Oral - Thin Cup -- Oral - Thin Straw -- Oral - Puree -- Oral - Mech Soft -- Oral - Regular -- Oral - Multi-Consistency -- Oral - Pill -- Oral Phase - Comment --  CHL IP PHARYNGEAL PHASE 12/25/2020 Pharyngeal Phase WFL Pharyngeal- Pudding Teaspoon -- Pharyngeal -- Pharyngeal- Pudding Cup -- Pharyngeal -- Pharyngeal- Honey Teaspoon -- Pharyngeal -- Pharyngeal- Honey Cup -- Pharyngeal -- Pharyngeal- Nectar Teaspoon -- Pharyngeal -- Pharyngeal- Nectar Cup -- Pharyngeal -- Pharyngeal- Nectar Straw -- Pharyngeal -- Pharyngeal- Thin Teaspoon -- Pharyngeal -- Pharyngeal- Thin Cup -- Pharyngeal -- Pharyngeal- Thin Straw -- Pharyngeal -- Pharyngeal- Puree -- Pharyngeal -- Pharyngeal- Mechanical Soft -- Pharyngeal -- Pharyngeal- Regular -- Pharyngeal -- Pharyngeal- Multi-consistency -- Pharyngeal -- Pharyngeal- Pill -- Pharyngeal -- Pharyngeal Comment --  CHL IP CERVICAL ESOPHAGEAL PHASE 12/25/2020 Cervical Esophageal Phase WFL Pudding Teaspoon -- Pudding Cup  -- Honey Teaspoon -- Honey Cup -- Nectar Teaspoon -- Nectar Cup -- Nectar Straw -- Thin Teaspoon -- Thin Cup -- Thin Straw -- Puree -- Mechanical Soft -- Regular -- Multi-consistency -- Pill -- Cervical Esophageal Comment -- Happi B. [name] M.S., CCC-SLP, [place] Office [phone] Stormy Fabian 12/25/2020, 2:17 PM              ECHOCARDIOGRAM COMPLETE  Result Date: 12/21/2020    ECHOCARDIOGRAM REPORT   Patient Name:   VALEN GILLISON Date of Exam: 12/21/2020 Medical Rec #:  [id number]    Height:       72.0 in Accession #:    [id number]   Weight:       137.1 lb Date of Birth:  08/16/1950     BSA:  1.815 m Patient Age:    70 years     BP:           114/80 mmHg Patient Gender: M            HR:           89 bpm. Exam Location:  [facility] Procedure: 2D Echo, Color Doppler and Cardiac Doppler Indications:     Endocarditis I38  History:         Patient has prior history of Echocardiogram examinations, most                  recent 08/19/2019. Arrythmias:Atrial Fibrillation; Risk                  Factors:Hypertension. Substance abuse.  Sonographer:     Sherrie Sport Referring Phys:  [place] Diagnosing Phys: Serafina Royals MD IMPRESSIONS  1. Left ventricular ejection fraction, by estimation, is 60 to 65%. The left ventricle has normal function. The left ventricle has no regional wall motion abnormalities. Left ventricular diastolic parameters were normal.  2. Right ventricular systolic function is normal. The right ventricular size is normal.  3. The mitral valve is normal in structure. Trivial mitral valve regurgitation.  4. The aortic valve is normal in structure. Aortic valve regurgitation is not visualized. Mild aortic valve sclerosis is present, with no evidence of aortic valve stenosis. Conclusion(s)/Recommendation(s): No evidence of valve vegetations. FINDINGS  Left Ventricle: Left ventricular ejection fraction, by estimation, is 60 to 65%. The left ventricle  has normal function. The left ventricle has no regional wall motion abnormalities. The left ventricular internal cavity size was small. There is no left ventricular hypertrophy. Left ventricular diastolic parameters were normal. Right Ventricle: The right ventricular size is normal. No increase in right ventricular wall thickness. Right ventricular systolic function is normal. Left Atrium: Left atrial size was normal in size. Right Atrium: Right atrial size was normal in size. Pericardium: There is no evidence of pericardial effusion. Mitral Valve: The mitral valve is normal in structure. Trivial mitral valve regurgitation. Tricuspid Valve: The tricuspid valve is normal in structure. Tricuspid valve regurgitation is trivial. Aortic Valve: The aortic valve is normal in structure. Aortic valve regurgitation is not visualized. Mild aortic valve sclerosis is present, with no evidence of aortic valve stenosis. Aortic valve mean gradient measures 3.0 mmHg. Aortic valve peak gradient measures 5.9 mmHg. Aortic valve area, by VTI measures 2.30 cm. Pulmonic Valve: The pulmonic valve was normal in structure. Pulmonic valve regurgitation is not visualized. Aorta: The aortic root and ascending aorta are structurally normal, with no evidence of dilitation. IAS/Shunts: No atrial level shunt detected by color flow Doppler.  LEFT VENTRICLE PLAX 2D LVIDd:         3.87 cm LVIDs:         2.30 cm LV PW:         1.00 cm LV IVS:        1.18 cm LVOT diam:     2.10 cm LV SV:         44 LV SV Index:   24 LVOT Area:     3.46 cm  RIGHT VENTRICLE RV Basal diam:  3.70 cm RV S prime:     12.40 cm/s TAPSE (M-mode): 3.7 cm LEFT ATRIUM           Index       RIGHT ATRIUM           Index LA diam:  3.50 cm 1.93 cm/m  RA Area:     33.70 cm LA Vol (A2C): 57.4 ml 31.63 ml/m RA Volume:   128.00 ml 70.53 ml/m LA Vol (A4C): 72.1 ml 39.73 ml/m  AORTIC VALVE                   PULMONIC VALVE AV Area (Vmax):    2.34 cm    PV Vmax:        0.60 m/s AV  Area (Vmean):   2.32 cm    PV Peak grad:   1.4 mmHg AV Area (VTI):     2.30 cm    RVOT Peak grad: 2 mmHg AV Vmax:           121.00 cm/s AV Vmean:          86.000 cm/s AV VTI:            0.193 m AV Peak Grad:      5.9 mmHg AV Mean Grad:      3.0 mmHg LVOT Vmax:         81.60 cm/s LVOT Vmean:        57.600 cm/s LVOT VTI:          0.128 m LVOT/AV VTI ratio: 0.66  AORTA Ao Root diam: 3.50 cm MITRAL VALVE               TRICUSPID VALVE MV Area (PHT): 4.52 cm    TR Peak grad:   24.4 mmHg MV Decel Time: 168 msec    TR Vmax:        247.00 cm/s MV E velocity: 88.30 cm/s                            SHUNTS                            Systemic VTI:  0.13 m                            Systemic Diam: 2.10 cm Serafina Royals MD Electronically signed by Serafina Royals MD Signature Date/Time: 12/21/2020/5:20:09 PM    Final    US Abdomen Limited RUQ (LIVER/GB)  Result Date: 12/18/2020 CLINICAL DATA:  Evaluate for cholecystitis. EXAM: ULTRASOUND ABDOMEN LIMITED RIGHT UPPER QUADRANT COMPARISON:  CT AP from 12/17/2020 FINDINGS: Gallbladder: The gallbladder wall is diffusely edematous measuring up to 13.1 mm. Gallstones are noted measuring up to 5.9 mm. Pericholecystic fluid. Positive sonographic Murphy's sign. Common bile duct: Diameter: 6 mm Liver: Diffusely increased parenchymal echogenicity identified. No focal liver abnormality. Portal vein is patent on color Doppler imaging with normal direction of blood flow towards the liver. Other: 1.9 cm right kidney cyst. IMPRESSION: Gallstones, gallbladder wall thickening and pericholecystic fluid with positive sonographic Murphy's sign. Findings are compatible with acute cholecystitis. Electronically Signed   By: Kerby Moors M.D.   On: 12/18/2020 14:18    Assessment and plan-   #Neutropenic sepsis secondary to Klebsiella pneumonia bacteremia Due to acute cholecystitis s/p cholecystostomy Surgery does not plan inpatient cholecystectomy- outpt follow up  Patient is on IV  antibiotics.   #Chemotherapy-induced pancytopenia Pancytopenia has completely resolved.  #Acute embolic stroke Echocardiogram showed no vegetation.   Cardiology recommend to defer TEE Atrial fibrillation, resumed on Eliquis 5 mg twice daily. Physical examination right upper extremity strength is slightly better.  # Extensive  small cell lung caner,  S/p 1 cycle of carboplatin/etoposide/Tecentriq. Currently admitted due to chemotherapy induced pancytopenia, sepsis. Counts have improved.  Overall prognosis is poor given the disease nature. Patient has severe protein calorie malnutrition and has very poor appetite.  appetite stimulant marinol has been ordered.  Discussed about the poor prognosis, due to incurable disease nature, multiple comorbidities and malnutrition.  Discussed about the slim chance of him recovery to an acceptable level to receive additional chemotherapy in the future.  Comfort measures/hospice was discussed with patient and patient does not want to consider at the current time.  He says that " I have fighted once and I will fight again." Discussed that feeding tube can be challenging as he is not able to care it and it is associated with potential complications.  Patient has discussed with his POA yesterday and has decided to proceed with feeding tube placement and to go to a rehab facility.  Patient prefers to continue current scope of care.  Code Status DNR  Thank you for allowing me to participate in the care of this patient.   Earlie Server, MD, PhD Hematology Oncology [place] at [facility]  12/25/2020

## 2020-12-25 NOTE — Progress Notes (Addendum)
ANTICOAGULATION CONSULT NOTE  Pharmacy Consult for heparin infusion Indication: afib and possible thromboembolic stroke  No Known Allergies  Patient Measurements: Height: 6' (182.9 cm) Weight: 70.8 kg (156 lb 1.4 oz) IBW/kg (Calculated) : 77.6  Vital Signs: Temp: 98.5 F (36.9 C) (09/16 1822) Temp Source: Oral (09/16 1822) BP: 139/98 (09/16 1822) Pulse Rate: 100 (09/16 1822)  Labs: Recent Labs    12/23/20 0305 12/24/20 0506 12/25/20 0554 12/25/20 1846  HGB 9.0* 10.2* 10.1*  --   HCT 26.0* 28.4* 29.3*  --   PLT 83* 133* 198  --   APTT  --   --   --  30  HEPARINUNFRC  --   --   --  0.82*  CREATININE 0.69 0.66 0.71  --   CKTOTAL  --   --  62  --      Estimated Creatinine Clearance: 87.3 mL/min (by C-G formula based on SCr of 0.71 mg/dL).   Medical History: Past Medical History:  Diagnosis Date   A-fib ([place]) 01/10/2019   pt st this was dx by Dr. Ubaldo Glassing   Cancer [facility]) 5102   LUNG   Complication of anesthesia    DIFFICULTY WAKING UP AFTER SURGERY- 20 YRS AGO   Hypertension    Lung cancer ([place])    Substance abuse ([place])     Medications:  Eliquis 5 mg BID - last dose 9/15 2017  Assessment: 70 year old M with PMH of stage IV small cell lung cancer on chemo, chemo induced pancytopenia, A. fib, chronic hep C, chronic cancer pain, HTN, alcohol abuse, malnutrition and polysubstance use presenting with nausea and vomiting and admitted with neutropenic sepsis due to acute cholecystitis and Klebsiella bacteremia. Pt with acute stroke confirmed on MRI 9/11. Pharmacy has been consulted for heparin dosing/monitoring. Anticipate initial HL to be elevated since pt last dose of Eliquis was 9/15. Pt planned for procedure on Monday (9/19).  9/16 18:46 aPTT 30, HL 0.82 - Heparin was stopped around 14:47 and patient went for thoracentesis  Goal of Therapy:  Heparin level 0.3-0.5 units/ml - confirmed with MD to utilize stroke goals aPTT 66-85 seconds - confirmed with MD to utilize  stroke goals Monitor platelets by anticoagulation protocol: Yes   Plan:  Will restart heparin infusion at previous rate of 1000 units/hr Check aPTT in 6 hours and HL daily while on heparin. Switch to HL monitoring once correlating with aPTT Continue to monitor H&H and platelets  Paulina Fusi, PharmD, BCPS 12/25/2020 7:23 PM

## 2020-12-25 NOTE — Progress Notes (Signed)
PROGRESS NOTE  Scott Jordan XBM:[id number] DOB: [birth year]   PCP: Earlie Server, MD  Patient is from: Home.   DOA: 12/17/2020 LOS: 8  Chief complaints:  Chief Complaint  Patient presents with   Emesis     Brief Narrative / Interim history: 70 year old M with PMH of stage IV small cell lung cancer on chemo, chemo induced pancytopenia, A. fib not on AC, chronic hep C, chronic cancer pain, HTN, alcohol abuse, malnutrition and polysubstance use presenting with nausea and vomiting and admitted with neutropenic sepsis due to acute cholecystitis and Klebsiella bacteremia.  He had cholecystostomy on 9/11 by IR.  Started on IV ceftriaxone and IV Flagyl.  Neutropenia resolved but he now has leukocytosis.  Infectious disease consulted  Hospital course complicated by acute thromboembolic CVA with new right-sided weakness for which she was evaluated by neurology and started on aspirin.  Eventually, thrombocytopenia improved and he was transitioned to Eliquis.   Patient continues to be deconditioned physically with poor p.o. intake.  Palliative medicine consulted.  CODE STATUS changed to DNR and DNI.  Family interested in feeding tube to optimize his nutrition so he can follow-up with oncology for chemotherapy.  IR consulted.  G-tube planned for 9/19.   Patient went into A. fib with RVR, but improved with as needed Cardizem.   Subjective: Seen and examined earlier this morning.  Patient went into A. fib with RVR with HR in 120s.  Heart rate improved with as needed Cardizem in addition to his Cardizem CD.  Blood pressure within normal.  He reports "a little" palpitation, shortness of breath and abdominal discomfort.  Feels weak and tired.  Objective: Vitals:   12/25/20 0634 12/25/20 0702 12/25/20 0830 12/25/20 1039  BP: (!) 138/100 (!) 144/93 (!) 137/92 131/89  Pulse: (!) 128 98 (!) 113 (!) 105  Resp: 20 20 20  (!) 22  Temp: 98 F (36.7 C) 98 F (36.7 C) 98 F (36.7 C) (!) 97.5 F (36.4 C)  TempSrc:    Oral Oral  SpO2: 98% 99% 98% 96%  Weight:      Height:        Intake/Output Summary (Last 24 hours) at 12/25/2020 1422 Last data filed at 12/25/2020 0810 Gross per 24 hour  Intake 674.02 ml  Output 715 ml  Net -40.98 ml   Filed Weights   12/17/20 1203 12/18/20 2130 12/24/20 1428  Weight: 61.7 kg 62.2 kg 70.8 kg    Examination:  GENERAL: No apparent distress.  Nontoxic. HEENT: MMM.  Vision and hearing grossly intact.  NECK: Supple.  No apparent JVD.  RESP: 98% on RA.  No IWOB.  Diminished air aeration on the left. CVS:  RRR. Heart sounds normal.  ABD/GI/GU: BS+. Abd soft, NTND.  PERC biliary drain in place.  Indwelling Foley. MSK/EXT:  Moves extremities. No apparent deformity. No edema.  SKIN: no apparent skin lesion or wound NEURO: Awake and alert. Oriented appropriately.  No apparent focal neuro deficit. PSYCH: Calm.  Somewhat flat affect.  Procedures:  9/11-percutaneous biliary drain by IR  Microbiology summarized: 9/8-COVID-19 and influenza PCR nonreactive. 9/8-C. difficile negative. 9/8-blood culture with pansensitive Klebsiella pneumonia except to ampicillin 9/8-urine culture NGTD 9/8-MRSA PCR screen positive 9/13-repeat blood cultures NGTD   Assessment & Plan: Neutropenic sepsis-neutropenia resolved. Septicemia secondary to Klebsiella pneumoniae bacteremia. Cholelithiasis with cholecystitis secondary to Klebsiella pneumoniae. -S/p percutaneous biliary drain by IR on 9/11. -TTE negative for vegetation.  -General surgery recommended outpatient follow-up in 6 to 8 weeks and signed off -  Continue IV ceftriaxone and IV Flagyl -Infectious disease consulted for guidance on antibiotic   Stage IV small cell lung cancer with osseous mets-s/p 1 cycle of chemo Pancytopenia related to chemotherapy-improved.  Now with leukocytosis. -Received 3 units of platelets this hospitalization. -H&H stable.  Leukopenia and thrombocytopenia resolved.  Now with leukocytosis   Acute  bilateral CVA-reportedly had right-sided hemiparesis although he seems to have left facial droop and left hemiparesis on my exam.  Felt to be thromboembolic in the setting of his A. Fib.  However, he is also septic with Klebsiella pneumonia bacteremia raising concern for septic emboli -TEE deferred by cardiology and neurology given his thrombocytopenia and risk for aspiration -Started on Eliquis on 9/15.  -Hold Eliquis and bridge with IV heparin pending PEG tube  Paroxysmal A. fib with RVR-on Cardizem and Eliquis at home.  Mild RVR to 120s but improved to 90s. -Continue p.o. Cardizem CD 240 mg daily -Added p.o. Cardizem 30 mg every 6 hours as needed sustained HR greater than 120 -Holding Eliquis and bridging with IV heparin for his PEG tube  Left pleural effusion?  Portable chest x-ray with left white out.  This is a significant change from his prior chest x-ray.  Surprisingly, no significant respiratory distress or oxygen requirement. -Pulmonology consulted, and thoracocentesis ordered. -Holding IV heparin   AKI/azotemia: Resolved. Recent Labs    12/08/20 0818 12/17/20 1205 12/18/20 0500 12/19/20 0457 12/20/20 0500 12/21/20 0434 12/22/20 0513 12/23/20 0305 12/24/20 0506 12/25/20 0554  BUN 18 31* 48* 43* 36* 30* 21 17 14 14   CREATININE 0.89 1.45* 1.46* 0.94 0.72 0.57* 0.59* 0.69 0.66 0.71  -Monitor intermittently  Hypokalemia/hypomagnesemia resolved.  Hyponatremia: Resolved.   Alcohol use disorder: No withdrawal signs or symptoms.  Physical deconditioning/debility -Continue PT/OT -Encourage OOB and incentive spirometry   Goals of care-patient with stage IV small cell lung cancer, acute thromboembolic CVA and other comorbidity as above.  Very poor long-term prognosis.   -Appreciate help by palliative care.  Changed CODE STATUS to DNR/DNI  Dysphagia-SLP recommended dysphagia 3 diet. Severe protein calorie malnutrition/anorexia-significant muscle mass loss.  P.o. intake  remains poor despite appetite stimulants and supplements.  Likely due to malignancy, chemo and acute illness.  Patient and family interested in G-tube feeding.  IR consulted.  Plan for G-tube next week Body mass index is 21.17 kg/m. Nutrition Problem: Severe Malnutrition Etiology: cancer and cancer related treatments Signs/Symptoms: severe fat depletion, severe muscle depletion Interventions: Prostat, MVI, Ensure Enlive (each supplement provides 350kcal and 20 grams of protein)   Pressure skin injury: Pressure Injury 12/18/20 Heel Right Deep Tissue Pressure Injury - Purple or maroon localized area of discolored intact skin or blood-filled blister due to damage of underlying soft tissue from pressure and/or shear. (Active)  12/18/20 2200  Location: Heel  Location Orientation: Right  Staging: Deep Tissue Pressure Injury - Purple or maroon localized area of discolored intact skin or blood-filled blister due to damage of underlying soft tissue from pressure and/or shear.  Wound Description (Comments):   Present on Admission:      Pressure Injury 12/18/20 Heel Left Deep Tissue Pressure Injury - Purple or maroon localized area of discolored intact skin or blood-filled blister due to damage of underlying soft tissue from pressure and/or shear. (Active)  12/18/20 2200  Location: Heel  Location Orientation: Left  Staging: Deep Tissue Pressure Injury - Purple or maroon localized area of discolored intact skin or blood-filled blister due to damage of underlying soft tissue from pressure and/or shear.  Wound Description (Comments):   Present on Admission:    DVT prophylaxis:  Place and maintain sequential compression device Start: 12/18/20 1303 Place TED hose Start: 12/17/20 1523  Code Status: Full code Family Communication: Patient and/or RN.  Updated patient's niece over the phone Level of care: Med-Surg Status is: Inpatient  Remains inpatient appropriate because:Unsafe d/c plan, IV  treatments appropriate due to intensity of illness or inability to take PO, and Inpatient level of care appropriate due to severity of illness  Dispo: The patient is from: Home              Anticipated d/c is to: SNF              Patient currently is not medically stable to d/c.   Difficult to place patient No       Consultants:  Neurology-signed off Cardiology-signed off Oncology Palliative medicine   Sch Meds:  Scheduled Meds:  sodium chloride   Intravenous Once   chlorhexidine  10 mL Mouth/Throat BID   Chlorhexidine Gluconate Cloth  6 each Topical Daily   diltiazem  240 mg Oral Daily   dronabinol  2.5 mg Oral BID AC   folic acid  1 mg Oral Daily   guaiFENesin  1,200 mg Oral BID   multivitamin with minerals  1 tablet Oral Daily   sodium chloride flush  10-40 mL Intracatheter Q12H   sodium chloride flush  5 mL Intracatheter Q8H   thiamine  100 mg Oral Daily   Continuous Infusions:  sodium chloride 10 mL/hr at 12/25/20 0318   sodium chloride 10 mL/hr (12/20/20 1220)   cefTRIAXone (ROCEPHIN)  IV 2 g (12/25/20 0551)   magnesium sulfate bolus IVPB     metronidazole 500 mg (12/25/20 1002)   PRN Meds:.sodium chloride, sodium chloride, acetaminophen **OR** acetaminophen, diltiazem, ondansetron **OR** ondansetron (ZOFRAN) IV, oxyCODONE-acetaminophen, sodium chloride flush  Antimicrobials: Anti-infectives (From admission, onward)    Start     Dose/Rate Route Frequency Ordered Stop   12/21/20 1200  metroNIDAZOLE (FLAGYL) IVPB 500 mg        500 mg 100 mL/hr over 60 Minutes Intravenous Every 12 hours 12/21/20 1023     12/19/20 0600  cefTRIAXone (ROCEPHIN) 2 g in sodium chloride 0.9 % 100 mL IVPB        2 g 200 mL/hr over 30 Minutes Intravenous Every 24 hours 12/18/20 0842     12/18/20 1800  vancomycin (VANCOREADY) IVPB 750 mg/150 mL  Status:  Discontinued        750 mg 150 mL/hr over 60 Minutes Intravenous Every 24 hours 12/17/20 1820 12/17/20 1830   12/18/20 1800   vancomycin (VANCOREADY) IVPB 750 mg/150 mL  Status:  Discontinued        750 mg 150 mL/hr over 60 Minutes Intravenous Every 24 hours 12/17/20 1830 12/18/20 0842   12/18/20 0600  ceFEPIme (MAXIPIME) 2 g in sodium chloride 0.9 % 100 mL IVPB  Status:  Discontinued        2 g 200 mL/hr over 30 Minutes Intravenous Every 12 hours 12/17/20 1544 12/17/20 1830   12/18/20 0600  ceFEPIme (MAXIPIME) 2 g in sodium chloride 0.9 % 100 mL IVPB        2 g 200 mL/hr over 30 Minutes Intravenous Every 12 hours 12/17/20 1830 12/18/20 1841   12/17/20 1700  vancomycin (VANCOREADY) IVPB 1500 mg/300 mL        1,500 mg 150 mL/hr over 120 Minutes Intravenous  Once 12/17/20 1544 12/17/20  2050   12/17/20 1600  metroNIDAZOLE (FLAGYL) IVPB 500 mg  Status:  Discontinued        500 mg 100 mL/hr over 60 Minutes Intravenous Every 12 hours 12/17/20 1524 12/18/20 0842   12/17/20 1230  cefTRIAXone (ROCEPHIN) 2 g in sodium chloride 0.9 % 100 mL IVPB  Status:  Discontinued        2 g 200 mL/hr over 30 Minutes Intravenous Every 24 hours 12/17/20 1215 12/17/20 1543        I have personally reviewed the following labs and images: CBC: Recent Labs  Lab 12/19/20 0457 12/20/20 0047 12/20/20 0500 12/21/20 0434 12/22/20 0513 12/23/20 0305 12/24/20 0506 12/25/20 0554  WBC 0.6*  --  1.7* 5.3 9.7 11.1* 14.8* 17.2*  NEUTROABS 0.3*  --  0.7* 3.5  --   --  9.1* 11.4*  HGB 8.9*  --  8.9* 8.8* 9.1* 9.0* 10.2* 10.1*  HCT 23.8*  --  24.0* 24.9* 25.0* 26.0* 28.4* 29.3*  MCV 94.4  --  94.5 97.6 96.2 96.7 99.0 97.7  PLT 9*   < > 42* 41* 51* 83* 133* 198   < > = values in this interval not displayed.   BMP &GFR Recent Labs  Lab 12/20/20 0500 12/21/20 0434 12/22/20 0513 12/23/20 0305 12/24/20 0506 12/25/20 0554  NA 137 141 141 141 142 141  K 3.4* 3.6 3.5 3.0* 4.1 3.5  CL 107 111 110 109 112* 112*  CO2 22 22 23 24 23 24   GLUCOSE 112* 99 115* 90 94 105*  BUN 36* 30* 21 17 14 14   CREATININE 0.72 0.57* 0.59* 0.69 0.66 0.71   CALCIUM 7.6* 7.8* 7.9* 7.6* 7.7* 7.7*  MG 2.1 2.0  --  1.7 1.8 1.6*  PHOS  --  1.6*  --   --  2.8 2.8   Estimated Creatinine Clearance: 87.3 mL/min (by C-G formula based on SCr of 0.71 mg/dL). Liver & Pancreas: Recent Labs  Lab 12/24/20 0506 12/25/20 0554  AST 28 23  ALT 78* 63*  ALKPHOS 61 60  BILITOT 0.9 1.0  PROT 5.2* 5.4*  ALBUMIN 2.1* 2.0*   No results for input(s): LIPASE, AMYLASE in the last 168 hours. No results for input(s): AMMONIA in the last 168 hours. Diabetic: No results for input(s): HGBA1C in the last 72 hours.  Recent Labs  Lab 12/18/20 2134 12/21/20 0809 12/21/20 1227  GLUCAP 73 118* 125*   Cardiac Enzymes: Recent Labs  Lab 12/25/20 0554  CKTOTAL 62   No results for input(s): PROBNP in the last 8760 hours. Coagulation Profile: No results for input(s): INR, PROTIME in the last 168 hours.  Thyroid Function Tests: No results for input(s): TSH, T4TOTAL, FREET4, T3FREE, THYROIDAB in the last 72 hours. Lipid Profile: No results for input(s): CHOL, HDL, LDLCALC, TRIG, CHOLHDL, LDLDIRECT in the last 72 hours.  Anemia Panel: No results for input(s): VITAMINB12, FOLATE, FERRITIN, TIBC, IRON, RETICCTPCT in the last 72 hours. Urine analysis:    Component Value Date/Time   COLORURINE AMBER (A) 12/17/2020 1840   APPEARANCEUR TURBID (A) 12/17/2020 1840   LABSPEC 1.030 12/17/2020 1840   PHURINE 5.0 12/17/2020 1840   GLUCOSEU 100 (A) 12/17/2020 1840   HGBUR LARGE (A) 12/17/2020 1840   BILIRUBINUR MODERATE (A) 12/17/2020 1840   KETONESUR TRACE (A) 12/17/2020 1840   PROTEINUR 100 (A) 12/17/2020 1840   NITRITE POSITIVE (A) 12/17/2020 1840   LEUKOCYTESUR NEGATIVE 12/17/2020 1840   Sepsis Labs: Invalid input(s): PROCALCITONIN, [place]  Microbiology: Recent Results (  from the past 240 hour(s))  Blood Culture (routine x 2)     Status: Abnormal   Collection Time: 12/17/20 12:05 PM   Specimen: BLOOD  Result Value Ref Range Status   Specimen  Description   Final    BLOOD LEFT CHEST PORT Performed at [facility], [address] [place], [place] [address]    Special Requests   Final    BOTTLES DRAWN AEROBIC AND ANAEROBIC Blood Culture adequate volume Performed at [facility], [address], [place], [place] [address]    Culture  Setup Time   Final    GRAM NEGATIVE RODS IN BOTH AEROBIC AND ANAEROBIC BOTTLES CRITICAL VALUE NOTED.  VALUE IS CONSISTENT WITH PREVIOUSLY REPORTED AND CALLED VALUE. Performed at [facility], [address] [place], [place] [address]    Culture (A)  Final    KLEBSIELLA PNEUMONIAE SUSCEPTIBILITIES PERFORMED ON PREVIOUS CULTURE WITHIN THE LAST 5 DAYS. Performed at [facility], [place] [address], [place], [place] [address]    Report Status 12/20/2020 FINAL  Final  Blood Culture (routine x 2)     Status: Abnormal   Collection Time: 12/17/20 12:05 PM   Specimen: BLOOD  Result Value Ref Range Status   Specimen Description   Final    BLOOD LEFT CHEST Performed at [facility], [address] [place], [place] [address]    Special Requests   Final    BOTTLES DRAWN AEROBIC AND ANAEROBIC Blood Culture adequate volume Performed at [facility], [address], [place], [place] [address]    Culture  Setup Time   Final    GRAM NEGATIVE RODS IN BOTH AEROBIC AND ANAEROBIC BOTTLES CRITICAL RESULT CALLED TO, READ BACK BY AND VERIFIED WITH: [place] 1275 12/18/20 HNM Performed at [facility], [place] [address], [place],  [address]    Culture KLEBSIELLA PNEUMONIAE (A)  Final   Report Status 12/20/2020 FINAL  Final   Organism ID, Bacteria KLEBSIELLA PNEUMONIAE  Final      Susceptibility   Klebsiella pneumoniae - MIC*    AMPICILLIN >=32 RESISTANT Resistant     CEFAZOLIN <=4 SENSITIVE Sensitive     CEFEPIME <=0.12 SENSITIVE Sensitive     CEFTAZIDIME <=1 SENSITIVE Sensitive     CEFTRIAXONE <=0.25 SENSITIVE Sensitive     CIPROFLOXACIN  <=0.25 SENSITIVE Sensitive     GENTAMICIN <=1 SENSITIVE Sensitive     IMIPENEM <=0.25 SENSITIVE Sensitive     TRIMETH/SULFA <=20 SENSITIVE Sensitive     AMPICILLIN/SULBACTAM 4 SENSITIVE Sensitive     PIP/TAZO <=4 SENSITIVE Sensitive     * KLEBSIELLA PNEUMONIAE  Blood Culture ID Panel (Reflexed)     Status: Abnormal   Collection Time: 12/17/20 12:05 PM  Result Value Ref Range Status   Enterococcus faecalis NOT DETECTED NOT DETECTED Final   Enterococcus Faecium NOT DETECTED NOT DETECTED Final   Listeria monocytogenes NOT DETECTED NOT DETECTED Final   Staphylococcus species NOT DETECTED NOT DETECTED Final   Staphylococcus aureus (BCID) NOT DETECTED NOT DETECTED Final   Staphylococcus epidermidis NOT DETECTED NOT DETECTED Final   Staphylococcus lugdunensis NOT DETECTED NOT DETECTED Final   Streptococcus species NOT DETECTED NOT DETECTED Final   Streptococcus agalactiae NOT DETECTED NOT DETECTED Final   Streptococcus pneumoniae NOT DETECTED NOT DETECTED Final   Streptococcus pyogenes NOT DETECTED NOT DETECTED Final   A.calcoaceticus-baumannii NOT DETECTED NOT DETECTED Final   Bacteroides fragilis NOT DETECTED NOT DETECTED Final   Enterobacterales DETECTED (  A) NOT DETECTED Final    Comment: Enterobacterales represent a large order of gram negative bacteria, not a single organism. CRITICAL RESULT CALLED TO, READ BACK BY AND VERIFIED WITH: [place] RN [phone] 12/18/20 HNM    Enterobacter cloacae complex NOT DETECTED NOT DETECTED Final   Escherichia coli NOT DETECTED NOT DETECTED Final   Klebsiella aerogenes NOT DETECTED NOT DETECTED Final   Klebsiella oxytoca NOT DETECTED NOT DETECTED Final   Klebsiella pneumoniae DETECTED (A) NOT DETECTED Final    Comment: CRITICAL RESULT CALLED TO, READ BACK BY AND VERIFIED WITH: [place] RN [phone] 12/18/20 HNM    Proteus species NOT DETECTED NOT DETECTED Final   Salmonella species NOT DETECTED NOT DETECTED Final   Serratia marcescens NOT DETECTED NOT  DETECTED Final   Haemophilus influenzae NOT DETECTED NOT DETECTED Final   Neisseria meningitidis NOT DETECTED NOT DETECTED Final   Pseudomonas aeruginosa NOT DETECTED NOT DETECTED Final   Stenotrophomonas maltophilia NOT DETECTED NOT DETECTED Final   Candida albicans NOT DETECTED NOT DETECTED Final   Candida auris NOT DETECTED NOT DETECTED Final   Candida glabrata NOT DETECTED NOT DETECTED Final   Candida krusei NOT DETECTED NOT DETECTED Final   Candida parapsilosis NOT DETECTED NOT DETECTED Final   Candida tropicalis NOT DETECTED NOT DETECTED Final   Cryptococcus neoformans/gattii NOT DETECTED NOT DETECTED Final   CTX-M ESBL NOT DETECTED NOT DETECTED Final   Carbapenem resistance IMP NOT DETECTED NOT DETECTED Final   Carbapenem resistance KPC NOT DETECTED NOT DETECTED Final   Carbapenem resistance NDM NOT DETECTED NOT DETECTED Final   Carbapenem resist OXA 48 LIKE NOT DETECTED NOT DETECTED Final   Carbapenem resistance VIM NOT DETECTED NOT DETECTED Final    Comment: Performed at [facility], [address] [place], [place] [address]  Resp Panel by RT-PCR (Flu A&B, Covid) Nasopharyngeal Swab     Status: None   Collection Time: 12/17/20 12:37 PM   Specimen: Nasopharyngeal Swab; Nasopharyngeal(NP) swabs in vial transport medium  Result Value Ref Range Status   SARS Coronavirus 2 by RT PCR NEGATIVE NEGATIVE Final    Comment: (NOTE) SARS-CoV-2 target nucleic acids are NOT DETECTED.  The SARS-CoV-2 RNA is generally detectable in upper respiratory specimens during the acute phase of infection. The lowest concentration of SARS-CoV-2 viral copies this assay can detect is 138 copies/mL. A negative result does not preclude SARS-Cov-2 infection and should not be used as the sole basis for treatment or other patient management decisions. A negative result may occur with  improper specimen collection/handling, submission of specimen other than nasopharyngeal swab, presence of viral  mutation(s) within the areas targeted by this assay, and inadequate number of viral copies(<138 copies/mL). A negative result must be combined with clinical observations, patient history, and epidemiological information. The expected result is Negative.  Fact Sheet for Patients:  EntrepreneurPulse.com.au  Fact Sheet for Healthcare Providers:  IncredibleEmployment.be  This test is no t yet approved or cleared by the [place] FDA and  has been authorized for detection and/or diagnosis of SARS-CoV-2 by FDA under an Emergency Use Authorization (EUA). This EUA will remain  in effect (meaning this test can be used) for the duration of the COVID-19 declaration under Section 564(b)(1) of the Act, 21 U.S.C.section 360bbb-3(b)(1), unless the authorization is terminated  or revoked sooner.       Influenza A by PCR NEGATIVE NEGATIVE Final   Influenza B by PCR NEGATIVE NEGATIVE Final    Comment: (NOTE) The Xpert Xpress SARS-CoV-2/FLU/RSV  plus assay is intended as an aid in the diagnosis of influenza from Nasopharyngeal swab specimens and should not be used as a sole basis for treatment. Nasal washings and aspirates are unacceptable for Xpert Xpress SARS-CoV-2/FLU/RSV testing.  Fact Sheet for Patients: EntrepreneurPulse.com.au  Fact Sheet for Healthcare Providers: IncredibleEmployment.be  This test is not yet approved or cleared by the [place] FDA and has been authorized for detection and/or diagnosis of SARS-CoV-2 by FDA under an Emergency Use Authorization (EUA). This EUA will remain in effect (meaning this test can be used) for the duration of the COVID-19 declaration under Section 564(b)(1) of the Act, 21 U.S.C. section 360bbb-3(b)(1), unless the authorization is terminated or revoked.  Performed at [facility], [address], [place], [place] [address]   Urine Culture     Status: None    Collection Time: 12/17/20  3:32 PM   Specimen: In/Out Cath Urine  Result Value Ref Range Status   Specimen Description   Final    IN/OUT CATH URINE Performed at [facility], [address] [place], [place] [address]    Special Requests   Final    NONE Performed at [facility], [address] [place], [place] [address]    Culture   Final    NO GROWTH Performed at [facility], [place] [address] [place], [place] [address]    Report Status 12/19/2020 FINAL  Final  C Difficile Quick Screen w PCR reflex     Status: None   Collection Time: 12/17/20  3:32 PM   Specimen: STOOL  Result Value Ref Range Status   C Diff antigen NEGATIVE NEGATIVE Final   C Diff toxin NEGATIVE NEGATIVE Final   C Diff interpretation No C. difficile detected.  Final    Comment: Performed at [facility], [address] [place], [place] [address]  MRSA Next Gen by PCR, Nasal     Status: Abnormal   Collection Time: 12/18/20  9:55 PM   Specimen: Nasal Mucosa; Nasal Swab  Result Value Ref Range Status   MRSA by PCR Next Gen DETECTED (A) NOT DETECTED Final    Comment: RESULT CALLED TO, READ BACK BY AND VERIFIED WITH: BETH BUONO @ 2327 12/18/20 LFD (NOTE) The GeneXpert MRSA Assay (FDA approved for NASAL specimens only), is one component of a comprehensive MRSA colonization surveillance program. It is not intended to diagnose MRSA infection nor to guide or monitor treatment for MRSA infections. Test performance is not FDA approved in patients less than 45 years old. Performed at [facility], [address] [place], [place] [address]   CULTURE, BLOOD (ROUTINE X 2) w Reflex to ID Panel     Status: None (Preliminary result)   Collection Time: 12/22/20  2:14 PM   Specimen: BLOOD  Result Value Ref Range Status   Specimen Description BLOOD LEFT ANTECUBITAL  Final   Special Requests   Final    BOTTLES DRAWN AEROBIC AND ANAEROBIC Blood Culture adequate volume    Culture   Final    NO GROWTH 3 DAYS Performed at [facility], [address], [place],  [address]    Report Status PENDING  Incomplete  CULTURE, BLOOD (ROUTINE X 2) w Reflex to ID Panel     Status: None (Preliminary result)   Collection Time: 12/22/20  2:15 PM   Specimen: BLOOD  Result Value Ref Range Status   Specimen Description BLOOD RIGHT ANTECUBITAL  Final   Special Requests  Final    BOTTLES DRAWN AEROBIC AND ANAEROBIC Blood Culture adequate volume   Culture   Final    NO GROWTH 3 DAYS Performed at [facility], [address], [place], [place] [address]    Report Status PENDING  Incomplete    Radiology Studies: DG Swallowing Func-Speech Pathology  Result Date: 12/25/2020 Table formatting from the original result was not included. Objective Swallowing Evaluation: Type of Study: MBS-Modified Barium Swallow Study  Patient Details Name: Scott Jordan MRN: [id number] Date of Birth: 05-Feb-1951 Today's Date: 12/25/2020 Time: SLP Start Time (ACUTE ONLY): 56 -SLP Stop Time (ACUTE ONLY): 1300 SLP Time Calculation (min) (ACUTE ONLY): 30 min Past Medical History: Past Medical History: Diagnosis Date  A-fib ([place]) 01/10/2019  pt st this was dx by Dr. Ubaldo Glassing  Cancer [facility]) [id number]  LUNG  Complication of anesthesia   DIFFICULTY WAKING UP AFTER SURGERY- 28 YRS AGO  Hypertension   Lung cancer ([place])   Substance abuse ([place])  Past Surgical History: Past Surgical History: Procedure Laterality Date  ARM DEBRIDEMENT Left   INCISION AND DEBRIDEMENT LOWER ARM -20 YRS AGO  BACK SURGERY    IR PERC CHOLECYSTOSTOMY  12/20/2020  PORTACATH PLACEMENT Left 08/29/2019  Procedure: INSERTION PORT-A-CATH;  Surgeon: Nestor Lewandowsky, MD;  Location: [facility] ORS;  Service: General;  Laterality: Left; HPI: Pt is a 70 year old male with PMH of 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 fib not on AC, chronic hep C, chronic cancer pain, HTN, alcohol abuse, malnutrition and  polysubstance who is admitted with neutropenic sepsis due to acute cholecystitis and Klebsiella bacteremia now s/p cholecystostomy on 9/11 by IR. Hospital admission complicated by acute thromboembolic stroke with new right-sided weakness-confirmed on MRI brain on 12/20/2020.  MRI: Multiple small areas of restricted diffusion in the bilateral  frontal and occipital and left parietal white matter, left  perirolandic cortex, left thalamocapsular region, and left  cerebellum, which are concerning for acute infarcts.  CXR at admit: Chronic left basilar scarring or atelectasis. No new or acute  findings.  Pt is followed by Palliative Care at [facility]  Subjective: pt able to communicate his wants/needs Assessment / Plan / Recommendation CHL IP CLINICAL IMPRESSIONS 12/25/2020 Clinical Impression Pt presents with adequate airway protection while consuming thin liquids, nectar thick liquids, puree and whole barium tablet with thin liquids. Pt's oral phase is unremarkable and his swallow response is timely with good airway protection. His overall medical comorbidities and respiratory decline place him at an overall increased risk of aspiration. For example, when consuming consecutive sips of thin liquids, pt became short of breath. At this time, recommend pt continue consuming dysphagia 3 diet for energy conservation with thin liquids, medicine whole with thin liquids. Pt needs to follow strict aspiration precautions such as taking single sips and rest breaks. SLP Visit Diagnosis Dysphagia, oropharyngeal phase (R13.12) Attention and concentration deficit following -- Frontal lobe and executive function deficit following -- Impact on safety and function Mild aspiration risk;Moderate aspiration risk   CHL IP TREATMENT RECOMMENDATION 12/25/2020 Treatment Recommendations No treatment recommended at this time   Prognosis 12/24/2020 Prognosis for Safe Diet Advancement Fair Barriers to Reach Goals Motivation;Time post  onset;Severity of deficits;Behavior Barriers/Prognosis Comment -- CHL IP DIET RECOMMENDATION 12/25/2020 SLP Diet Recommendations Dysphagia 3 (Mech soft) solids;Thin liquid Liquid Administration via Cup Medication Administration Whole meds with liquid Compensations Minimize environmental distractions;Slow rate;Small sips/bites Postural Changes Remain semi-upright after after feeds/meals (Comment);Seated upright at 90 degrees  CHL IP OTHER RECOMMENDATIONS 12/25/2020 Recommended Consults -- Oral Care Recommendations Oral care BID Other Recommendations --   CHL IP FOLLOW UP RECOMMENDATIONS 12/25/2020 Follow up Recommendations None   CHL IP FREQUENCY AND DURATION 12/24/2020 Speech Therapy Frequency (ACUTE ONLY) min 3x week Treatment Duration 2 weeks      CHL IP ORAL PHASE 12/25/2020 Oral Phase WFL Oral - Pudding Teaspoon -- Oral - Pudding Cup -- Oral - Honey Teaspoon -- Oral - Honey Cup -- Oral - Nectar Teaspoon -- Oral - Nectar Cup -- Oral - Nectar Straw -- Oral - Thin Teaspoon -- Oral - Thin Cup -- Oral - Thin Straw -- Oral - Puree -- Oral - Mech Soft -- Oral - Regular -- Oral - Multi-Consistency -- Oral - Pill -- Oral Phase - Comment --  CHL IP PHARYNGEAL PHASE 12/25/2020 Pharyngeal Phase WFL Pharyngeal- Pudding Teaspoon -- Pharyngeal -- Pharyngeal- Pudding Cup -- Pharyngeal -- Pharyngeal- Honey Teaspoon -- Pharyngeal -- Pharyngeal- Honey Cup -- Pharyngeal -- Pharyngeal- Nectar Teaspoon -- Pharyngeal -- Pharyngeal- Nectar Cup -- Pharyngeal -- Pharyngeal- Nectar Straw -- Pharyngeal -- Pharyngeal- Thin Teaspoon -- Pharyngeal -- Pharyngeal- Thin Cup -- Pharyngeal -- Pharyngeal- Thin Straw -- Pharyngeal -- Pharyngeal- Puree -- Pharyngeal -- Pharyngeal- Mechanical Soft -- Pharyngeal -- Pharyngeal- Regular -- Pharyngeal -- Pharyngeal- Multi-consistency -- Pharyngeal -- Pharyngeal- Pill -- Pharyngeal -- Pharyngeal Comment --  CHL IP CERVICAL ESOPHAGEAL PHASE 12/25/2020 Cervical Esophageal Phase WFL Pudding Teaspoon -- Pudding Cup  -- Honey Teaspoon -- Honey Cup -- Nectar Teaspoon -- Nectar Cup -- Nectar Straw -- Thin Teaspoon -- Thin Cup -- Thin Straw -- Puree -- Mechanical Soft -- Regular -- Multi-consistency -- Pill -- Cervical Esophageal Comment -- Happi B. [name] M.S., CCC-SLP, [place] Office [phone] Happi [name] 12/25/2020, 2:17 PM                 Wiley Flicker T. [place]  If 7PM-7AM, please contact night-coverage [email or web address] 12/25/2020, 2:22 PM

## 2020-12-25 NOTE — Progress Notes (Signed)
PT Cancellation Note  Patient Details Name: Scott Jordan MRN: [id number] DOB: [birth year]   Cancelled Treatment:    Reason Eval/Treat Not Completed: Other (comment). RN at bedside to place IV for in prep for heparin infusion. PT to re-attempt as able.  Lieutenant Diego PT, DPT 11:18 AM,12/25/20

## 2020-12-26 DIAGNOSIS — A419 Sepsis, unspecified organism: Secondary | ICD-10-CM | POA: Diagnosis not present

## 2020-12-26 DIAGNOSIS — I484 Atypical atrial flutter: Secondary | ICD-10-CM | POA: Diagnosis not present

## 2020-12-26 DIAGNOSIS — F1010 Alcohol abuse, uncomplicated: Secondary | ICD-10-CM | POA: Diagnosis not present

## 2020-12-26 DIAGNOSIS — D6481 Anemia due to antineoplastic chemotherapy: Secondary | ICD-10-CM | POA: Diagnosis not present

## 2020-12-26 LAB — CBC
HCT: 26.3 % — ABNORMAL LOW (ref 39.0–52.0)
Hemoglobin: 9.1 g/dL — ABNORMAL LOW (ref 13.0–17.0)
MCH: 34.3 pg — ABNORMAL HIGH (ref 26.0–34.0)
MCHC: 34.6 g/dL (ref 30.0–36.0)
MCV: 99.2 fL (ref 80.0–100.0)
Platelets: 227 10*3/uL (ref 150–400)
RBC: 2.65 MIL/uL — ABNORMAL LOW (ref 4.22–5.81)
RDW: 14 % (ref 11.5–15.5)
WBC: 14.9 10*3/uL — ABNORMAL HIGH (ref 4.0–10.5)
nRBC: 0 % (ref 0.0–0.2)

## 2020-12-26 LAB — ACID FAST SMEAR (AFB, MYCOBACTERIA): Acid Fast Smear: NEGATIVE

## 2020-12-26 LAB — APTT
aPTT: 42 seconds — ABNORMAL HIGH (ref 24–36)
aPTT: 57 seconds — ABNORMAL HIGH (ref 24–36)
aPTT: 64 seconds — ABNORMAL HIGH (ref 24–36)

## 2020-12-26 LAB — HEPARIN LEVEL (UNFRACTIONATED): Heparin Unfractionated: 0.54 IU/mL (ref 0.30–0.70)

## 2020-12-26 NOTE — Progress Notes (Signed)
ANTICOAGULATION CONSULT NOTE  Pharmacy Consult for heparin infusion Indication: afib and possible thromboembolic stroke  No Known Allergies  Patient Measurements: Height: 6' (182.9 cm) Weight: 70.8 kg (156 lb 1.4 oz) IBW/kg (Calculated) : 77.6  Vital Signs: Temp: 97.9 F (36.6 C) (09/17 1940) Temp Source: Oral (09/17 1533) BP: 113/77 (09/17 1940) Pulse Rate: 89 (09/17 1940)  Labs: Recent Labs    12/24/20 0506 12/25/20 0554 12/25/20 1846 12/25/20 1846 12/26/20 0324 12/26/20 1429 12/26/20 2242  HGB 10.2* 10.1*  --   --  9.1*  --   --   HCT 28.4* 29.3*  --   --  26.3*  --   --   PLT 133* 198  --   --  227  --   --   APTT  --   --  30   < > 42* 57* 64*  HEPARINUNFRC  --   --  0.82*  --  0.54  --   --   CREATININE 0.66 0.71  --   --   --   --   --   CKTOTAL  --  62  --   --   --   --   --    < > = values in this interval not displayed.     Estimated Creatinine Clearance: 87.3 mL/min (by C-G formula based on SCr of 0.71 mg/dL).   Medical History: Past Medical History:  Diagnosis Date   A-fib ([place]) 01/10/2019   pt st this was dx by Dr. Ubaldo Glassing   Cancer [facility]) 1898   LUNG   Complication of anesthesia    DIFFICULTY WAKING UP AFTER SURGERY- 20 YRS AGO   Hypertension    Lung cancer ([place])    Substance abuse ([place])     Medications:  Eliquis 5 mg BID - last dose 9/15 2017  Assessment: 70 year old M with PMH of stage IV small cell lung cancer on chemo, chemo induced pancytopenia, A. fib, chronic hep C, chronic cancer pain, HTN, alcohol abuse, malnutrition and polysubstance use presenting with nausea and vomiting and admitted with neutropenic sepsis due to acute cholecystitis and Klebsiella bacteremia. Pt with acute stroke confirmed on MRI 9/11. Pharmacy has been consulted for heparin dosing/monitoring. Anticipate initial HL to be elevated since pt last dose of Eliquis was 9/15. Pt planned for procedure on Monday (9/19).  9/16 18:46 aPTT 30, HL 0.82 - Heparin was stopped  around 14:47 and patient went for thoracentesis 9/17 0324 aPTT 42 HL 0.54 increase heparin drip rate to 1150 units/hr  9/17 1429 aPTT 57  9/17 2242 aPTT 64  Goal of Therapy:  Heparin level 0.3-0.5 units/ml - confirmed with MD to utilize stroke goals aPTT 66-85 seconds - confirmed with MD to utilize stroke goals Monitor platelets by anticoagulation protocol: Yes   Plan:  9/17:  aPTT @ 2242 = 64 , subtherapeutic Will increase heparin drip to 1350 units/hr and recheck aPTT 6 hrs after rate change.   Novah Goza D, PharmD 12/26/2020 11:24 PM

## 2020-12-26 NOTE — Progress Notes (Signed)
ANTICOAGULATION CONSULT NOTE  Pharmacy Consult for heparin infusion Indication: afib and possible thromboembolic stroke  No Known Allergies  Patient Measurements: Height: 6' (182.9 cm) Weight: 70.8 kg (156 lb 1.4 oz) IBW/kg (Calculated) : 77.6  Vital Signs: Temp: 97.6 F (36.4 C) (09/17 0943) Temp Source: Oral (09/17 0943) BP: 129/84 (09/17 0943) Pulse Rate: 92 (09/17 0943)  Labs: Recent Labs    12/24/20 0506 12/25/20 0554 12/25/20 1846 12/26/20 0324 12/26/20 1429  HGB 10.2* 10.1*  --  9.1*  --   HCT 28.4* 29.3*  --  26.3*  --   PLT 133* 198  --  227  --   APTT  --   --  30 42* 57*  HEPARINUNFRC  --   --  0.82* 0.54  --   CREATININE 0.66 0.71  --   --   --   CKTOTAL  --  62  --   --   --      Estimated Creatinine Clearance: 87.3 mL/min (by C-G formula based on SCr of 0.71 mg/dL).   Medical History: Past Medical History:  Diagnosis Date   A-fib ([place]) 01/10/2019   pt st this was dx by Dr. Ubaldo Glassing   Cancer [facility]) 0786   LUNG   Complication of anesthesia    DIFFICULTY WAKING UP AFTER SURGERY- 20 YRS AGO   Hypertension    Lung cancer ([place])    Substance abuse ([place])     Medications:  Eliquis 5 mg BID - last dose 9/15 2017  Assessment: 70 year old M with PMH of stage IV small cell lung cancer on chemo, chemo induced pancytopenia, A. fib, chronic hep C, chronic cancer pain, HTN, alcohol abuse, malnutrition and polysubstance use presenting with nausea and vomiting and admitted with neutropenic sepsis due to acute cholecystitis and Klebsiella bacteremia. Pt with acute stroke confirmed on MRI 9/11. Pharmacy has been consulted for heparin dosing/monitoring. Anticipate initial HL to be elevated since pt last dose of Eliquis was 9/15. Pt planned for procedure on Monday (9/19).  9/16 18:46 aPTT 30, HL 0.82 - Heparin was stopped around 14:47 and patient went for thoracentesis 9/17 0324 aPTT 42 HL 0.54 increase heparin drip rate to 1150 units/hr  9/17 1429 aPTT 57   Goal of  Therapy:  Heparin level 0.3-0.5 units/ml - confirmed with MD to utilize stroke goals aPTT 66-85 seconds - confirmed with MD to utilize stroke goals Monitor platelets by anticoagulation protocol: Yes   Plan:  aPTT subtherapeutic. Will increase heparin infusion to 1250 units/hr. Recheck aPTT in 6 hours. CBC daily while on heparin. Switch to heparin level once aPTT and heparin level correlate.   Oswald Hillock, PharmD 12/26/2020 2:57 PM

## 2020-12-26 NOTE — Progress Notes (Signed)
PROGRESS NOTE  Scott Jordan LTJ:[id number] DOB: [birth year]   PCP: Earlie Server, MD  Patient is from: Home.   DOA: 12/17/2020 LOS: 9  Chief complaints:  Chief Complaint  Patient presents with   Emesis     Brief Narrative / Interim history: 70 year old M with PMH of stage IV small cell lung cancer on chemo, chemo induced pancytopenia, A. fib not on AC, chronic hep C, chronic cancer pain, HTN, alcohol abuse, malnutrition and polysubstance use presenting with nausea and vomiting and admitted with neutropenic sepsis due to acute cholecystitis and Klebsiella bacteremia.  He had cholecystostomy on 9/11 by IR.  Started on IV ceftriaxone and IV Flagyl.  Neutropenia resolved but he now has leukocytosis.  Infectious disease consulted  Hospital course complicated by acute thromboembolic CVA with new right-sided weakness for which she was evaluated by neurology and started on aspirin.  Eventually, thrombocytopenia improved and he was transitioned to Eliquis.   Patient continues to be deconditioned physically with poor p.o. intake.  Palliative medicine consulted.  CODE STATUS changed to DNR and DNI.  Family interested in feeding tube to optimize his nutrition so he can follow-up with oncology for chemotherapy.  IR consulted.  G-tube planned for early next week.   Subjective: Seen and examined earlier this morning.  No major events overnight of this morning.  He had thoracocentesis with removal of 700 cc fluid from left chest yesterday.  No complaint this morning.  He denies chest pain, shortness of breath, cough or GI symptoms.  Continues to feel weak and fatigued.  Objective: Vitals:   12/25/20 1822 12/25/20 2035 12/26/20 0549 12/26/20 0943  BP: (!) 139/98 (!) 138/94 138/89 129/84  Pulse: 100 (!) 107 91 92  Resp: 20 18 18  (!) 22  Temp: 98.5 F (36.9 C) 98.5 F (36.9 C) 98.3 F (36.8 C) 97.6 F (36.4 C)  TempSrc: Oral   Oral  SpO2: 98% 97% 98% 95%  Weight:      Height:        Intake/Output  Summary (Last 24 hours) at 12/26/2020 1447 Last data filed at 12/26/2020 1443 Gross per 24 hour  Intake 486.71 ml  Output 1170 ml  Net -683.29 ml   Filed Weights   12/17/20 1203 12/18/20 2130 12/24/20 1428  Weight: 61.7 kg 62.2 kg 70.8 kg    Examination:  GENERAL: Frail and chronically ill-appearing.  Appears weak and fatigued. HEENT: MMM.  Vision and hearing grossly intact.  NECK: Supple.  No apparent JVD.  RESP: 98% on RA.  No IWOB.  Fair aeration bilaterally. CVS:  RRR. Heart sounds normal.  ABD/GI/GU: BS+. Abd soft, NTND.  Indwelling Foley in place. MSK/EXT:  Moves extremities. No apparent deformity. No edema.  SKIN: no apparent skin lesion or wound NEURO: Awake and alert. Oriented appropriately.  Left facial droop with weaker LUE PSYCH: Calm. Normal affect.   Procedures:  9/11-percutaneous biliary drain by IR 9/16-left-sided thoracocentesis with removal of 700 cc fluid  Microbiology summarized: 9/8-COVID-19 and influenza PCR nonreactive. 9/8-C. difficile negative. 9/8-blood culture with pansensitive Klebsiella pneumonia except to ampicillin 9/8-urine culture NGTD 9/8-MRSA PCR screen positive 9/13-repeat blood cultures NGTD 9/16-pleural fluid cultures NGTD 9/16-pleural fluid AFS/AFC pending.   Assessment & Plan: Neutropenic sepsis-neutropenia resolved. Septicemia secondary to Klebsiella pneumoniae bacteremia. Cholelithiasis with cholecystitis secondary to Klebsiella pneumoniae. -Culture data as above. -S/p percutaneous biliary drain by IR on 9/11. -TTE negative for vegetation.  -Continue IV ceftriaxone 9/9>>> -Received Flagyl from 9/9-9/17 -ID recommends 10 days of ceftriaxone.  Stage IV small cell lung cancer with osseous mets-s/p 1 cycle of chemo Pancytopenia related to chemotherapy-improved.  Now with leukocytosis. -Received 3 units of platelets this hospitalization. -H&H stable.  Leukopenia and thrombocytopenia resolved.  Now with leukocytosis   Acute  bilateral CVA-reportedly had right-sided hemiparesis although he seems to have left facial droop and left hemiparesis on my exam.  Felt to be thromboembolic in the setting of his A. Fib.   -Cardiology, neurology and ID did not feel TEE is necessary.  -Started on Eliquis on 9/15>> IV heparin on 9/16 as a bridge for PEG tube placement.   Paroxysmal A. fib with RVR-on Cardizem and Eliquis at home.  RVR resolved. -Continue p.o. Cardizem CD 240 mg daily -P.o. Cardizem 30 mg every 6 hours as needed sustained HR greater than 120 -Anticoagulation as above  Left pleural effusion/left atelectasis: Likely malignant effusion.  S/p left thoracocentesis with removal of 700 cc on 9/16.  Fluid culture NGTD. -Appreciate help by pulmonology and IR -Follow other pleural fluid studies -Incentive spirometry, OOB, PT/OT   AKI/azotemia: Resolved. Recent Labs    12/08/20 0818 12/17/20 1205 12/18/20 0500 12/19/20 0457 12/20/20 0500 12/21/20 0434 12/22/20 0513 12/23/20 0305 12/24/20 0506 12/25/20 0554  BUN 18 31* 48* 43* 36* 30* 21 17 14 14   CREATININE 0.89 1.45* 1.46* 0.94 0.72 0.57* 0.59* 0.69 0.66 0.71  -Monitor intermittently  Hypokalemia/hypomagnesemia resolved.  Hyponatremia: Resolved.   Alcohol use disorder: No withdrawal signs or symptoms.  Physical deconditioning/debility -Incentive spirometry, OOB/PT/OT  Urinary tension?  Patient has indwelling Foley placed on admission. -Voiding trial   Goals of care-patient with stage IV small cell lung cancer, acute thromboembolic CVA and other comorbidity as above.  Very poor long-term prognosis.   -Appreciate help by palliative care.  Changed CODE STATUS to DNR/DNI  Dysphagia-SLP recommended dysphagia 3 diet. Severe protein calorie malnutrition/anorexia-significant muscle mass loss.  P.o. intake remains poor despite appetite stimulants and supplements.  Likely due to malignancy, chemo and acute illness.  Patient and family interested in G-tube  feeding.  IR consulted.  Plan for G-tube next week Body mass index is 21.17 kg/m. Nutrition Problem: Severe Malnutrition Etiology: cancer and cancer related treatments Signs/Symptoms: severe fat depletion, severe muscle depletion Interventions: Prostat, MVI, Ensure Enlive (each supplement provides 350kcal and 20 grams of protein)   Pressure skin injury: Pressure Injury 12/18/20 Heel Right Deep Tissue Pressure Injury - Purple or maroon localized area of discolored intact skin or blood-filled blister due to damage of underlying soft tissue from pressure and/or shear. (Active)  12/18/20 2200  Location: Heel  Location Orientation: Right  Staging: Deep Tissue Pressure Injury - Purple or maroon localized area of discolored intact skin or blood-filled blister due to damage of underlying soft tissue from pressure and/or shear.  Wound Description (Comments):   Present on Admission:      Pressure Injury 12/18/20 Heel Left Deep Tissue Pressure Injury - Purple or maroon localized area of discolored intact skin or blood-filled blister due to damage of underlying soft tissue from pressure and/or shear. (Active)  12/18/20 2200  Location: Heel  Location Orientation: Left  Staging: Deep Tissue Pressure Injury - Purple or maroon localized area of discolored intact skin or blood-filled blister due to damage of underlying soft tissue from pressure and/or shear.  Wound Description (Comments):   Present on Admission:    DVT prophylaxis:  Place and maintain sequential compression device Start: 12/18/20 1303 Place TED hose Start: 12/17/20 1523  Code Status: Full code Family Communication:  Patient and/or RN.  Updated patient's niece over the phone on 9/16. Level of care: Med-Surg Status is: Inpatient  Remains inpatient appropriate because:Unsafe d/c plan, IV treatments appropriate due to intensity of illness or inability to take PO, and Inpatient level of care appropriate due to severity of  illness  Dispo: The patient is from: Home              Anticipated d/c is to: SNF              Patient currently is not medically stable to d/c.   Difficult to place patient No       Consultants:  Neurology-signed off Cardiology-signed off Oncology Palliative medicine   Sch Meds:  Scheduled Meds:  sodium chloride   Intravenous Once   chlorhexidine  10 mL Mouth/Throat BID   Chlorhexidine Gluconate Cloth  6 each Topical Daily   diltiazem  240 mg Oral Daily   dronabinol  2.5 mg Oral BID AC   folic acid  1 mg Oral Daily   guaiFENesin  1,200 mg Oral BID   multivitamin with minerals  1 tablet Oral Daily   sodium chloride flush  10-40 mL Intracatheter Q12H   sodium chloride flush  5 mL Intracatheter Q8H   thiamine  100 mg Oral Daily   Continuous Infusions:  sodium chloride Stopped (12/25/20 1558)   sodium chloride 10 mL/hr (12/20/20 1220)   cefTRIAXone (ROCEPHIN)  IV 2 g (12/26/20 0546)   heparin 1,150 Units/hr (12/26/20 1791)   metronidazole 500 mg (12/26/20 0935)   PRN Meds:.sodium chloride, sodium chloride, acetaminophen **OR** acetaminophen, diltiazem, ondansetron **OR** ondansetron (ZOFRAN) IV, oxyCODONE-acetaminophen, sodium chloride flush  Antimicrobials: Anti-infectives (From admission, onward)    Start     Dose/Rate Route Frequency Ordered Stop   12/21/20 1200  metroNIDAZOLE (FLAGYL) IVPB 500 mg        500 mg 100 mL/hr over 60 Minutes Intravenous Every 12 hours 12/21/20 1023     12/19/20 0600  cefTRIAXone (ROCEPHIN) 2 g in sodium chloride 0.9 % 100 mL IVPB        2 g 200 mL/hr over 30 Minutes Intravenous Every 24 hours 12/18/20 0842     12/18/20 1800  vancomycin (VANCOREADY) IVPB 750 mg/150 mL  Status:  Discontinued        750 mg 150 mL/hr over 60 Minutes Intravenous Every 24 hours 12/17/20 1820 12/17/20 1830   12/18/20 1800  vancomycin (VANCOREADY) IVPB 750 mg/150 mL  Status:  Discontinued        750 mg 150 mL/hr over 60 Minutes Intravenous Every 24 hours  12/17/20 1830 12/18/20 0842   12/18/20 0600  ceFEPIme (MAXIPIME) 2 g in sodium chloride 0.9 % 100 mL IVPB  Status:  Discontinued        2 g 200 mL/hr over 30 Minutes Intravenous Every 12 hours 12/17/20 1544 12/17/20 1830   12/18/20 0600  ceFEPIme (MAXIPIME) 2 g in sodium chloride 0.9 % 100 mL IVPB        2 g 200 mL/hr over 30 Minutes Intravenous Every 12 hours 12/17/20 1830 12/18/20 1841   12/17/20 1700  vancomycin (VANCOREADY) IVPB 1500 mg/300 mL        1,500 mg 150 mL/hr over 120 Minutes Intravenous  Once 12/17/20 1544 12/17/20 2050   12/17/20 1600  metroNIDAZOLE (FLAGYL) IVPB 500 mg  Status:  Discontinued        500 mg 100 mL/hr over 60 Minutes Intravenous Every 12 hours 12/17/20 1524 12/18/20 0842  12/17/20 1230  cefTRIAXone (ROCEPHIN) 2 g in sodium chloride 0.9 % 100 mL IVPB  Status:  Discontinued        2 g 200 mL/hr over 30 Minutes Intravenous Every 24 hours 12/17/20 1215 12/17/20 1543        I have personally reviewed the following labs and images: CBC: Recent Labs  Lab 12/20/20 0500 12/21/20 0434 12/22/20 0513 12/23/20 0305 12/24/20 0506 12/25/20 0554 12/26/20 0324  WBC 1.7* 5.3 9.7 11.1* 14.8* 17.2* 14.9*  NEUTROABS 0.7* 3.5  --   --  9.1* 11.4*  --   HGB 8.9* 8.8* 9.1* 9.0* 10.2* 10.1* 9.1*  HCT 24.0* 24.9* 25.0* 26.0* 28.4* 29.3* 26.3*  MCV 94.5 97.6 96.2 96.7 99.0 97.7 99.2  PLT 42* 41* 51* 83* 133* 198 227   BMP &GFR Recent Labs  Lab 12/20/20 0500 12/21/20 0434 12/22/20 0513 12/23/20 0305 12/24/20 0506 12/25/20 0554  NA 137 141 141 141 142 141  K 3.4* 3.6 3.5 3.0* 4.1 3.5  CL 107 111 110 109 112* 112*  CO2 22 22 23 24 23 24   GLUCOSE 112* 99 115* 90 94 105*  BUN 36* 30* 21 17 14 14   CREATININE 0.72 0.57* 0.59* 0.69 0.66 0.71  CALCIUM 7.6* 7.8* 7.9* 7.6* 7.7* 7.7*  MG 2.1 2.0  --  1.7 1.8 1.6*  PHOS  --  1.6*  --   --  2.8 2.8   Estimated Creatinine Clearance: 87.3 mL/min (by C-G formula based on SCr of 0.71 mg/dL). Liver & Pancreas: Recent  Labs  Lab 12/24/20 0506 12/25/20 0554  AST 28 23  ALT 78* 63*  ALKPHOS 61 60  BILITOT 0.9 1.0  PROT 5.2* 5.4*  ALBUMIN 2.1* 2.0*   No results for input(s): LIPASE, AMYLASE in the last 168 hours. No results for input(s): AMMONIA in the last 168 hours. Diabetic: No results for input(s): HGBA1C in the last 72 hours.  Recent Labs  Lab 12/21/20 0809 12/21/20 1227  GLUCAP 118* 125*   Cardiac Enzymes: Recent Labs  Lab 12/25/20 0554  CKTOTAL 62   No results for input(s): PROBNP in the last 8760 hours. Coagulation Profile: No results for input(s): INR, PROTIME in the last 168 hours.  Thyroid Function Tests: No results for input(s): TSH, T4TOTAL, FREET4, T3FREE, THYROIDAB in the last 72 hours. Lipid Profile: No results for input(s): CHOL, HDL, LDLCALC, TRIG, CHOLHDL, LDLDIRECT in the last 72 hours.  Anemia Panel: No results for input(s): VITAMINB12, FOLATE, FERRITIN, TIBC, IRON, RETICCTPCT in the last 72 hours. Urine analysis:    Component Value Date/Time   COLORURINE AMBER (A) 12/17/2020 1840   APPEARANCEUR TURBID (A) 12/17/2020 1840   LABSPEC 1.030 12/17/2020 1840   PHURINE 5.0 12/17/2020 1840   GLUCOSEU 100 (A) 12/17/2020 1840   HGBUR LARGE (A) 12/17/2020 1840   BILIRUBINUR MODERATE (A) 12/17/2020 1840   KETONESUR TRACE (A) 12/17/2020 1840   PROTEINUR 100 (A) 12/17/2020 1840   NITRITE POSITIVE (A) 12/17/2020 1840   LEUKOCYTESUR NEGATIVE 12/17/2020 1840   Sepsis Labs: Invalid input(s): PROCALCITONIN, [place]  Microbiology: Recent Results (from the past 240 hour(s))  Blood Culture (routine x 2)     Status: Abnormal   Collection Time: 12/17/20 12:05 PM   Specimen: BLOOD  Result Value Ref Range Status   Specimen Description   Final    BLOOD LEFT CHEST PORT Performed at [facility], [address], [place], [place] [address]    Special Requests   Final    BOTTLES  DRAWN AEROBIC AND ANAEROBIC Blood Culture adequate volume Performed at [facility], [address] [place], [place] [address]    Culture  Setup Time   Final    GRAM NEGATIVE RODS IN BOTH AEROBIC AND ANAEROBIC BOTTLES CRITICAL VALUE NOTED.  VALUE IS CONSISTENT WITH PREVIOUSLY REPORTED AND CALLED VALUE. Performed at [facility], [address] [place], [place] [address]    Culture (A)  Final    KLEBSIELLA PNEUMONIAE SUSCEPTIBILITIES PERFORMED ON PREVIOUS CULTURE WITHIN THE LAST 5 DAYS. Performed at [facility], [place] [address], [place], [place] [address]    Report Status 12/20/2020 FINAL  Final  Blood Culture (routine x 2)     Status: Abnormal   Collection Time: 12/17/20 12:05 PM   Specimen: BLOOD  Result Value Ref Range Status   Specimen Description   Final    BLOOD LEFT CHEST Performed at [facility], [address], [place], [place] [address]    Special Requests   Final    BOTTLES DRAWN AEROBIC AND ANAEROBIC Blood Culture adequate volume Performed at [facility], [address] [place], [place] [address]    Culture  Setup Time   Final    GRAM NEGATIVE RODS IN BOTH AEROBIC AND ANAEROBIC BOTTLES CRITICAL RESULT CALLED TO, READ BACK BY AND VERIFIED WITH: [place] 1194 12/18/20 HNM Performed at [facility], [place] [address], [place], [place] [address]    Culture KLEBSIELLA PNEUMONIAE (A)  Final   Report Status 12/20/2020 FINAL  Final   Organism ID, Bacteria KLEBSIELLA PNEUMONIAE  Final      Susceptibility   Klebsiella pneumoniae - MIC*    AMPICILLIN >=32 RESISTANT Resistant     CEFAZOLIN <=4 SENSITIVE Sensitive     CEFEPIME <=0.12 SENSITIVE Sensitive     CEFTAZIDIME <=1 SENSITIVE Sensitive     CEFTRIAXONE <=0.25 SENSITIVE Sensitive     CIPROFLOXACIN <=0.25 SENSITIVE Sensitive     GENTAMICIN <=1 SENSITIVE Sensitive     IMIPENEM <=0.25 SENSITIVE Sensitive     TRIMETH/SULFA <=20 SENSITIVE Sensitive     AMPICILLIN/SULBACTAM 4 SENSITIVE Sensitive     PIP/TAZO <=4 SENSITIVE Sensitive     *  KLEBSIELLA PNEUMONIAE  Blood Culture ID Panel (Reflexed)     Status: Abnormal   Collection Time: 12/17/20 12:05 PM  Result Value Ref Range Status   Enterococcus faecalis NOT DETECTED NOT DETECTED Final   Enterococcus Faecium NOT DETECTED NOT DETECTED Final   Listeria monocytogenes NOT DETECTED NOT DETECTED Final   Staphylococcus species NOT DETECTED NOT DETECTED Final   Staphylococcus aureus (BCID) NOT DETECTED NOT DETECTED Final   Staphylococcus epidermidis NOT DETECTED NOT DETECTED Final   Staphylococcus lugdunensis NOT DETECTED NOT DETECTED Final   Streptococcus species NOT DETECTED NOT DETECTED Final   Streptococcus agalactiae NOT DETECTED NOT DETECTED Final   Streptococcus pneumoniae NOT DETECTED NOT DETECTED Final   Streptococcus pyogenes NOT DETECTED NOT DETECTED Final   A.calcoaceticus-baumannii NOT DETECTED NOT DETECTED Final   Bacteroides fragilis NOT DETECTED NOT DETECTED Final   Enterobacterales DETECTED (A) NOT DETECTED Final    Comment: Enterobacterales represent a large order of gram negative bacteria, not a single organism. CRITICAL RESULT CALLED TO, READ BACK BY AND VERIFIED WITH: [place] [phone] 12/18/20 HNM    Enterobacter cloacae complex NOT DETECTED NOT DETECTED Final   Escherichia coli NOT DETECTED NOT DETECTED Final   Klebsiella aerogenes NOT DETECTED NOT DETECTED Final   Klebsiella oxytoca NOT DETECTED  NOT DETECTED Final   Klebsiella pneumoniae DETECTED (A) NOT DETECTED Final    Comment: CRITICAL RESULT CALLED TO, READ BACK BY AND VERIFIED WITH: [place] RN [phone] 12/18/20 HNM    Proteus species NOT DETECTED NOT DETECTED Final   Salmonella species NOT DETECTED NOT DETECTED Final   Serratia marcescens NOT DETECTED NOT DETECTED Final   Haemophilus influenzae NOT DETECTED NOT DETECTED Final   Neisseria meningitidis NOT DETECTED NOT DETECTED Final   Pseudomonas aeruginosa NOT DETECTED NOT DETECTED Final   Stenotrophomonas maltophilia NOT DETECTED NOT  DETECTED Final   Candida albicans NOT DETECTED NOT DETECTED Final   Candida auris NOT DETECTED NOT DETECTED Final   Candida glabrata NOT DETECTED NOT DETECTED Final   Candida krusei NOT DETECTED NOT DETECTED Final   Candida parapsilosis NOT DETECTED NOT DETECTED Final   Candida tropicalis NOT DETECTED NOT DETECTED Final   Cryptococcus neoformans/gattii NOT DETECTED NOT DETECTED Final   CTX-M ESBL NOT DETECTED NOT DETECTED Final   Carbapenem resistance IMP NOT DETECTED NOT DETECTED Final   Carbapenem resistance KPC NOT DETECTED NOT DETECTED Final   Carbapenem resistance NDM NOT DETECTED NOT DETECTED Final   Carbapenem resist OXA 48 LIKE NOT DETECTED NOT DETECTED Final   Carbapenem resistance VIM NOT DETECTED NOT DETECTED Final    Comment: Performed at [facility], [address] [place], [place] [address]  Resp Panel by RT-PCR (Flu A&B, Covid) Nasopharyngeal Swab     Status: None   Collection Time: 12/17/20 12:37 PM   Specimen: Nasopharyngeal Swab; Nasopharyngeal(NP) swabs in vial transport medium  Result Value Ref Range Status   SARS Coronavirus 2 by RT PCR NEGATIVE NEGATIVE Final    Comment: (NOTE) SARS-CoV-2 target nucleic acids are NOT DETECTED.  The SARS-CoV-2 RNA is generally detectable in upper respiratory specimens during the acute phase of infection. The lowest concentration of SARS-CoV-2 viral copies this assay can detect is 138 copies/mL. A negative result does not preclude SARS-Cov-2 infection and should not be used as the sole basis for treatment or other patient management decisions. A negative result may occur with  improper specimen collection/handling, submission of specimen other than nasopharyngeal swab, presence of viral mutation(s) within the areas targeted by this assay, and inadequate number of viral copies(<138 copies/mL). A negative result must be combined with clinical observations, patient history, and epidemiological information. The expected  result is Negative.  Fact Sheet for Patients:  EntrepreneurPulse.com.au  Fact Sheet for Healthcare Providers:  IncredibleEmployment.be  This test is no t yet approved or cleared by the [place] FDA and  has been authorized for detection and/or diagnosis of SARS-CoV-2 by FDA under an Emergency Use Authorization (EUA). This EUA will remain  in effect (meaning this test can be used) for the duration of the COVID-19 declaration under Section 564(b)(1) of the Act, 21 U.S.C.section 360bbb-3(b)(1), unless the authorization is terminated  or revoked sooner.       Influenza A by PCR NEGATIVE NEGATIVE Final   Influenza B by PCR NEGATIVE NEGATIVE Final    Comment: (NOTE) The Xpert Xpress SARS-CoV-2/FLU/RSV plus assay is intended as an aid in the diagnosis of influenza from Nasopharyngeal swab specimens and should not be used as a sole basis for treatment. Nasal washings and aspirates are unacceptable for Xpert Xpress SARS-CoV-2/FLU/RSV testing.  Fact Sheet for Patients: EntrepreneurPulse.com.au  Fact Sheet for Healthcare Providers: IncredibleEmployment.be  This test is not yet approved or cleared by the [place] FDA and has been authorized for detection and/or  diagnosis of SARS-CoV-2 by FDA under an Emergency Use Authorization (EUA). This EUA will remain in effect (meaning this test can be used) for the duration of the COVID-19 declaration under Section 564(b)(1) of the Act, 21 U.S.C. section 360bbb-3(b)(1), unless the authorization is terminated or revoked.  Performed at [facility], [address], [place], [place] [address]   Urine Culture     Status: None   Collection Time: 12/17/20  3:32 PM   Specimen: In/Out Cath Urine  Result Value Ref Range Status   Specimen Description   Final    IN/OUT CATH URINE Performed at [facility], [address], [place], [place] [address]     Special Requests   Final    NONE Performed at [facility], [address], [place], [place] [address]    Culture   Final    NO GROWTH Performed at [facility], [place] [address], [place], [place] [address]    Report Status 12/19/2020 FINAL  Final  C Difficile Quick Screen w PCR reflex     Status: None   Collection Time: 12/17/20  3:32 PM   Specimen: STOOL  Result Value Ref Range Status   C Diff antigen NEGATIVE NEGATIVE Final   C Diff toxin NEGATIVE NEGATIVE Final   C Diff interpretation No C. difficile detected.  Final    Comment: Performed at [facility], [address] [place], [place] [address]  MRSA Next Gen by PCR, Nasal     Status: Abnormal   Collection Time: 12/18/20  9:55 PM   Specimen: Nasal Mucosa; Nasal Swab  Result Value Ref Range Status   MRSA by PCR Next Gen DETECTED (A) NOT DETECTED Final    Comment: RESULT CALLED TO, READ BACK BY AND VERIFIED WITH: BETH BUONO @ 2327 12/18/20 LFD (NOTE) The GeneXpert MRSA Assay (FDA approved for NASAL specimens only), is one component of a comprehensive MRSA colonization surveillance program. It is not intended to diagnose MRSA infection nor to guide or monitor treatment for MRSA infections. Test performance is not FDA approved in patients less than 59 years old. Performed at [facility], [address] [place], [place] [address]   CULTURE, BLOOD (ROUTINE X 2) w Reflex to ID Panel     Status: None (Preliminary result)   Collection Time: 12/22/20  2:14 PM   Specimen: BLOOD  Result Value Ref Range Status   Specimen Description BLOOD LEFT ANTECUBITAL  Final   Special Requests   Final    BOTTLES DRAWN AEROBIC AND ANAEROBIC Blood Culture adequate volume   Culture   Final    NO GROWTH 4 DAYS Performed at [facility], [address] [place], [place] [address]    Report Status PENDING  Incomplete  CULTURE, BLOOD (ROUTINE X 2) w Reflex to ID Panel     Status: None (Preliminary  result)   Collection Time: 12/22/20  2:15 PM   Specimen: BLOOD  Result Value Ref Range Status   Specimen Description BLOOD RIGHT ANTECUBITAL  Final   Special Requests   Final    BOTTLES DRAWN AEROBIC AND ANAEROBIC Blood Culture adequate volume   Culture   Final    NO GROWTH 4 DAYS Performed at [facility], [address], [place], [place] [address]    Report Status PENDING  Incomplete  Body fluid culture w Gram Stain     Status: None (Preliminary result)   Collection Time: 12/25/20  4:30 PM  Specimen: PATH Cytology Pleural fluid  Result Value Ref Range Status   Specimen Description   Final    PLEURAL Performed at [facility], [address] [place], [place] [address]    Special Requests   Final    NONE Performed at [facility], [place], [place] [address]    Gram Stain   Final    RARE WBC PRESENT,BOTH PMN AND MONONUCLEAR NO ORGANISMS SEEN    Culture   Final    NO GROWTH < 24 HOURS Performed at [facility], [place] [address], [place] [address]    Report Status PENDING  Incomplete  Body fluid culture w Gram Stain     Status: None (Preliminary result)   Collection Time: 12/25/20  4:49 PM   Specimen: BILE; Body Fluid  Result Value Ref Range Status   Specimen Description   Final    BILE Performed at [facility], [address], [place], [place] [address]    Special Requests   Final    NONE Performed at [facility], [address] [place], [place] [address]    Gram Stain   Final    RARE WBC PRESENT, PREDOMINANTLY MONONUCLEAR NO ORGANISMS SEEN    Culture   Final    NO GROWTH < 12 HOURS Performed at [facility], [place] [address] [place],  [address]    Report Status PENDING  Incomplete    Radiology Studies: DG Chest 1 View  Result Date: 12/25/2020 CLINICAL DATA:  Thoracentesis.  Lung cancer EXAM: CHEST  1 VIEW COMPARISON:  Radiograph 12/25/2020 FINDINGS: Interval  reduction in LEFT pleural fluid following thoracentesis. No pneumothorax appreciated. A small basilar effusion remains. RIGHT lung clear. Port in the anterior chest wall with tip in distal SVC. IMPRESSION: 1. No pneumothorax appreciated following LEFT thoracentesis. 2. Considerable reduction in LEFT pleural fluid. Electronically Signed   By: Suzy Bouchard M.D.   On: 12/25/2020 17:28   US THORACENTESIS ASP PLEURAL SPACE W/IMG GUIDE  Result Date: 12/25/2020 INDICATION: Large left pleural effusion secondary to known lung cancer. Request for diagnostic and therapeutic thoracentesis. EXAM: ULTRASOUND GUIDED LEFT THORACENTESIS MEDICATIONS: 1% lidocaine 10 mL COMPLICATIONS: None immediate. PROCEDURE: An ultrasound guided thoracentesis was thoroughly discussed with the patient and questions answered. The benefits, risks, alternatives and complications were also discussed. The patient understands and wishes to proceed with the procedure. Written consent was obtained. Ultrasound was performed to localize and mark an adequate pocket of fluid in the left chest. The area was then prepped and draped in the normal sterile fashion. 1% Lidocaine was used for local anesthesia. Under ultrasound guidance a 6 Fr Safe-T-Centesis catheter was introduced. Thoracentesis was performed. The catheter was removed and a dressing applied. FINDINGS: A total of approximately 700 mL of hazy yellow fluid was removed. Samples were sent to the laboratory as requested by the clinical team. IMPRESSION: Successful ultrasound guided left thoracentesis yielding 700 mL of pleural fluid. Read by: Gareth Eagle, PA-C Electronically Signed   By: Michaelle Birks M.D.   On: 12/25/2020 16:44      My Rinke T. Towner  If 7PM-7AM, please contact night-coverage [email or web address] 12/26/2020, 2:47 PM

## 2020-12-26 NOTE — Progress Notes (Signed)
ANTICOAGULATION CONSULT NOTE  Pharmacy Consult for heparin infusion Indication: afib and possible thromboembolic stroke  No Known Allergies  Patient Measurements: Height: 6' (182.9 cm) Weight: 70.8 kg (156 lb 1.4 oz) IBW/kg (Calculated) : 77.6  Vital Signs: Temp: 98.3 F (36.8 C) (09/17 0549) Temp Source: Oral (09/16 1822) BP: 138/89 (09/17 0549) Pulse Rate: 91 (09/17 0549)  Labs: Recent Labs    12/24/20 0506 12/25/20 0554 12/25/20 1846 12/26/20 0324  HGB 10.2* 10.1*  --  9.1*  HCT 28.4* 29.3*  --  26.3*  PLT 133* 198  --  227  APTT  --   --  30 42*  HEPARINUNFRC  --   --  0.82* 0.54  CREATININE 0.66 0.71  --   --   CKTOTAL  --  62  --   --      Estimated Creatinine Clearance: 87.3 mL/min (by C-G formula based on SCr of 0.71 mg/dL).   Medical History: Past Medical History:  Diagnosis Date   A-fib ([place]) 01/10/2019   pt st this was dx by Dr. Ubaldo Glassing   Cancer [facility]) [id number]   LUNG   Complication of anesthesia    DIFFICULTY WAKING UP AFTER SURGERY- 20 YRS AGO   Hypertension    Lung cancer ([place])    Substance abuse ([place])     Medications:  Eliquis 5 mg BID - last dose 9/15 2017  Assessment: 70 year old M with PMH of stage IV small cell lung cancer on chemo, chemo induced pancytopenia, A. fib, chronic hep C, chronic cancer pain, HTN, alcohol abuse, malnutrition and polysubstance use presenting with nausea and vomiting and admitted with neutropenic sepsis due to acute cholecystitis and Klebsiella bacteremia. Pt with acute stroke confirmed on MRI 9/11. Pharmacy has been consulted for heparin dosing/monitoring. Anticipate initial HL to be elevated since pt last dose of Eliquis was 9/15. Pt planned for procedure on Monday (9/19).  9/16 18:46 aPTT 30, HL 0.82 - Heparin was stopped around 14:47 and patient went for thoracentesis  Goal of Therapy:  Heparin level 0.3-0.5 units/ml - confirmed with MD to utilize stroke goals aPTT 66-85 seconds - confirmed with MD to utilize  stroke goals Monitor platelets by anticoagulation protocol: Yes   Plan:  9/17 @ 0324 :   aPTT = 42,  HL = 0.54  APTT is slightly subtherapeutic. Will increase heparin drip rate to 1150 units/hr and recheck aPTT 6 hrs after rate change.  Will continue to use aPTT to guide dosing.  Will recheck HL on 9/18 with AM labs.   Osinachi Navarrette D 12/26/2020 6:13 AM

## 2020-12-27 ENCOUNTER — Inpatient Hospital Stay: Payer: Medicare Other

## 2020-12-27 DIAGNOSIS — F1010 Alcohol abuse, uncomplicated: Secondary | ICD-10-CM | POA: Diagnosis not present

## 2020-12-27 DIAGNOSIS — A419 Sepsis, unspecified organism: Secondary | ICD-10-CM | POA: Diagnosis not present

## 2020-12-27 DIAGNOSIS — I484 Atypical atrial flutter: Secondary | ICD-10-CM | POA: Diagnosis not present

## 2020-12-27 DIAGNOSIS — D6481 Anemia due to antineoplastic chemotherapy: Secondary | ICD-10-CM | POA: Diagnosis not present

## 2020-12-27 LAB — CULTURE, BLOOD (ROUTINE X 2)
Culture: NO GROWTH
Culture: NO GROWTH
Special Requests: ADEQUATE
Special Requests: ADEQUATE

## 2020-12-27 LAB — RENAL FUNCTION PANEL
Albumin: 1.9 g/dL — ABNORMAL LOW (ref 3.5–5.0)
Anion gap: 4 — ABNORMAL LOW (ref 5–15)
BUN: 12 mg/dL (ref 8–23)
CO2: 25 mmol/L (ref 22–32)
Calcium: 7.3 mg/dL — ABNORMAL LOW (ref 8.9–10.3)
Chloride: 108 mmol/L (ref 98–111)
Creatinine, Ser: 0.57 mg/dL — ABNORMAL LOW (ref 0.61–1.24)
GFR, Estimated: >60 mL/min (ref >60)
Glucose, Bld: 98 mg/dL (ref 70–99)
Phosphorus: 2.9 mg/dL (ref 2.5–4.6)
Potassium: 3 mmol/L — ABNORMAL LOW (ref 3.5–5.1)
Sodium: 137 mmol/L (ref 135–145)

## 2020-12-27 LAB — CBC
HCT: 24.1 % — ABNORMAL LOW (ref 39.0–52.0)
Hemoglobin: 8.5 g/dL — ABNORMAL LOW (ref 13.0–17.0)
MCH: 34.7 pg — ABNORMAL HIGH (ref 26.0–34.0)
MCHC: 35.3 g/dL (ref 30.0–36.0)
MCV: 98.4 fL (ref 80.0–100.0)
Platelets: 310 10*3/uL (ref 150–400)
RBC: 2.45 MIL/uL — ABNORMAL LOW (ref 4.22–5.81)
RDW: 13.9 % (ref 11.5–15.5)
WBC: 15.6 10*3/uL — ABNORMAL HIGH (ref 4.0–10.5)
nRBC: 0 % (ref 0.0–0.2)

## 2020-12-27 LAB — BRAIN NATRIURETIC PEPTIDE: B Natriuretic Peptide: 436 pg/mL — ABNORMAL HIGH (ref 0.0–100.0)

## 2020-12-27 LAB — HEPARIN LEVEL (UNFRACTIONATED)
Heparin Unfractionated: 0.25 IU/mL — ABNORMAL LOW (ref 0.30–0.70)
Heparin Unfractionated: 0.19 IU/mL — ABNORMAL LOW (ref 0.30–0.70)

## 2020-12-27 LAB — MAGNESIUM: Magnesium: 1.7 mg/dL (ref 1.7–2.4)

## 2020-12-27 LAB — APTT
aPTT: 75 seconds — ABNORMAL HIGH (ref 24–36)
aPTT: 63 seconds — ABNORMAL HIGH (ref 24–36)

## 2020-12-27 MED ORDER — MAGNESIUM SULFATE 2 GM/50ML IV SOLN
2.0000 g | Freq: Once | INTRAVENOUS | Status: AC
  Administered 2020-12-27: 2 g via INTRAVENOUS
  Filled 2020-12-27: qty 50

## 2020-12-27 MED ORDER — FUROSEMIDE 20 MG PO TABS
20.0000 mg | ORAL_TABLET | Freq: Every day | ORAL | Status: DC
  Administered 2020-12-27 – 2021-01-04 (×8): 20 mg via ORAL
  Filled 2020-12-27 (×9): qty 1

## 2020-12-27 MED ORDER — HEPARIN BOLUS VIA INFUSION
1000.0000 [IU] | Freq: Once | INTRAVENOUS | Status: DC
  Filled 2020-12-27: qty 1000

## 2020-12-27 MED ORDER — POTASSIUM CHLORIDE CRYS ER 20 MEQ PO TBCR
40.0000 meq | EXTENDED_RELEASE_TABLET | ORAL | Status: AC
  Administered 2020-12-27 (×2): 40 meq via ORAL
  Filled 2020-12-27 (×2): qty 2

## 2020-12-27 MED ORDER — ALBUTEROL SULFATE (2.5 MG/3ML) 0.083% IN NEBU
2.5000 mg | INHALATION_SOLUTION | Freq: Two times a day (BID) | RESPIRATORY_TRACT | Status: DC
  Filled 2020-12-27 (×2): qty 3

## 2020-12-27 NOTE — Progress Notes (Signed)
ANTICOAGULATION CONSULT NOTE  Pharmacy Consult for heparin infusion Indication: afib and possible thromboembolic stroke  No Known Allergies  Patient Measurements: Height: 6' (182.9 cm) Weight: 70.8 kg (156 lb 1.4 oz) IBW/kg (Calculated) : 77.6 HEPARIN DW (KG): 62.2   Vital Signs: Temp: 98 F (36.7 C) (09/18 2023) Temp Source: Oral (09/18 2023) BP: 131/86 (09/18 2023) Pulse Rate: 97 (09/18 2023)  Labs: Recent Labs    12/25/20 0554 12/25/20 1846 12/26/20 0324 12/26/20 1429 12/26/20 2242 12/27/20 0615 12/27/20 1158 12/27/20 2000  HGB 10.1*  --  9.1*  --   --  8.5*  --   --   HCT 29.3*  --  26.3*  --   --  24.1*  --   --   PLT 198  --  227  --   --  310  --   --   APTT  --    < > 42*   < > 64* 75* 63*  --   HEPARINUNFRC  --    < > 0.54  --   --  0.25*  --  0.19*  CREATININE 0.71  --   --   --   --  0.57*  --   --   CKTOTAL 62  --   --   --   --   --   --   --    < > = values in this interval not displayed.     Estimated Creatinine Clearance: 87.3 mL/min (A) (by C-G formula based on SCr of 0.57 mg/dL (L)).   Medical History: Past Medical History:  Diagnosis Date   A-fib ([place]) 01/10/2019   pt st this was dx by Dr. Ubaldo Glassing   Cancer [facility]) [id number]   LUNG   Complication of anesthesia    DIFFICULTY WAKING UP AFTER SURGERY- 20 YRS AGO   Hypertension    Lung cancer ([place])    Substance abuse ([place])     Medications:  Eliquis 5 mg BID - last dose 9/15 2017  Assessment: 70 year old M with PMH of stage IV small cell lung cancer on chemo, chemo induced pancytopenia, A. fib, chronic hep C, chronic cancer pain, HTN, alcohol abuse, malnutrition and polysubstance use presenting with nausea and vomiting and admitted with neutropenic sepsis due to acute cholecystitis and Klebsiella bacteremia. Pt with acute stroke confirmed on MRI 9/11. Pharmacy has been consulted for heparin dosing/monitoring. Anticipate initial HL to be elevated since pt last dose of Eliquis was 9/15. Pt planned for  procedure on Monday (9/19).  9/16 18:46 aPTT 30, HL 0.82 - Heparin was stopped around 14:47 and patient went for thoracentesis 9/17 0324 aPTT 42 HL 0.54 increase heparin drip rate to 1150 units/hr  9/17 1429 aPTT 57  9/17 2242 aPTT 64 9/18 0615 aPTT 75 HL 0.25.  9/18 1158 aPTT 63  9/18   This evening, heparin level is subtherapeutic at 0.19 despite infusion rate increase earlier this afternoon. Hgb and platelets low but stable this morning. No issues with the infusion or overt bleeding noted per RN.  Goal of Therapy:  Heparin level 0.3-0.5 units/ml - confirmed with MD to utilize stroke goals Monitor platelets by anticoagulation protocol: Yes   Plan:  Increase heparin infusion to 1600 units/hr Check heparin level in 6 hours Monitor CBC, daily heparin level  Continue to monitor for signs/symptoms of bleeding F/u transition back to apixaban   Brendolyn Patty, PharmD Clinical Pharmacist  12/27/2020   9:54 PM

## 2020-12-27 NOTE — Progress Notes (Addendum)
[facility] for heparin infusion Indication: afib and possible thromboembolic stroke  No Known Allergies  Patient Measurements: Height: 6' (182.9 cm) Weight: 70.8 kg (156 lb 1.4 oz) IBW/kg (Calculated) : 77.6  Vital Signs: Temp: 97.8 F (36.6 C) (09/18 1219) Temp Source: Oral (09/18 1219) BP: 126/84 (09/18 1219) Pulse Rate: 99 (09/18 1219)  Labs: Recent Labs    12/25/20 0554 12/25/20 0554 12/25/20 1846 12/26/20 0324 12/26/20 1429 12/26/20 2242 12/27/20 0615 12/27/20 1158  HGB 10.1*  --   --  9.1*  --   --  8.5*  --   HCT 29.3*  --   --  26.3*  --   --  24.1*  --   PLT 198  --   --  227  --   --  310  --   APTT  --    < > 30 42*   < > 64* 75* 63*  HEPARINUNFRC  --   --  0.82* 0.54  --   --  0.25*  --   CREATININE 0.71  --   --   --   --   --  0.57*  --   CKTOTAL 62  --   --   --   --   --   --   --    < > = values in this interval not displayed.     Estimated Creatinine Clearance: 87.3 mL/min (A) (by C-G formula based on SCr of 0.57 mg/dL (L)).   Medical History: Past Medical History:  Diagnosis Date   A-fib ([place]) 01/10/2019   pt st this was dx by Dr. Ubaldo Glassing   Cancer [facility]) [id number]   LUNG   Complication of anesthesia    DIFFICULTY WAKING UP AFTER SURGERY- 20 YRS AGO   Hypertension    Lung cancer ([place])    Substance abuse ([place])     Medications:  Eliquis 5 mg BID - last dose 9/15 2017  Assessment: 70 year old M with PMH of stage IV small cell lung cancer on chemo, chemo induced pancytopenia, A. fib, chronic hep C, chronic cancer pain, HTN, alcohol abuse, malnutrition and polysubstance use presenting with nausea and vomiting and admitted with neutropenic sepsis due to acute cholecystitis and Klebsiella bacteremia. Pt with acute stroke confirmed on MRI 9/11. Pharmacy has been consulted for heparin dosing/monitoring. Anticipate initial HL to be elevated since pt last dose of Eliquis was 9/15. Pt planned for procedure on Monday  (9/19).  9/16 18:46 aPTT 30, HL 0.82 - Heparin was stopped around 14:47 and patient went for thoracentesis 9/17 0324 aPTT 42 HL 0.54 increase heparin drip rate to 1150 units/hr  9/17 1429 aPTT 57  9/17 2242 aPTT 64 9/18 0615 aPTT 75 HL 0.25.  9/18 1158 aPTT 63   Goal of Therapy:  Heparin level 0.3-0.5 units/ml - confirmed with MD to utilize stroke goals aPTT 66-85 seconds - confirmed with MD to utilize stroke goals Monitor platelets by anticoagulation protocol: Yes   Plan:  Heparin level and aPTT seem to be correlating, will switch to heparin level monitoring. Increase heparin infusion to 1500 units/hr. Recheck aPTT in 6 hours. CBC daily while on heparin.  Oswald Hillock, PharmD, BCPS  12/27/2020 12:58 PM

## 2020-12-27 NOTE — Progress Notes (Signed)
Pulmonary Medicine          Date: 12/27/2020,   MRN# [id number] Scott Jordan [birth year]     AdmissionWeight: 61.7 kg                 CurrentWeight: 70.8 kg  Referring physician: Dr Cyndia Skeeters    CHIEF COMPLAINT:   Acute on chronic hypoxemic respiratory failure   HISTORY OF PRESENT ILLNESS   This is a pleasant male with hx of lung cancer, stage 4 small cell ca, substance abuse, chronic HCV, bony metastasis who is noted to have abnormal chest imaging with complete white out of left lung.    He appears weak and is severely dyspenic during my evaluation bu able to speak slowly.  PCCM consultation for evaluation and management. Pictorial documentation of tis thoracic imaging is included below showing large pleural effusion with associated complete left atelcetasis.   12/27/20- patient feels improved, s/p throacentesis 700 cc removed,  repeat CXR this am with improvement on left.   Atelectasis is still present and he need to have aggressive BPH, will order metaneb with albuterol today to be done with RT twice daily   PAST MEDICAL HISTORY   Past Medical History:  Diagnosis Date  . A-fib ([place]) 01/10/2019   pt st this was dx by Dr. Ubaldo Glassing  . Cancer ([place]) 2021   LUNG  . Complication of anesthesia    DIFFICULTY WAKING UP AFTER SURGERY- 20 YRS AGO  . Hypertension   . Lung cancer ([place])   . Substance abuse ([place])      SURGICAL HISTORY   Past Surgical History:  Procedure Laterality Date  . ARM DEBRIDEMENT Left    INCISION AND DEBRIDEMENT LOWER ARM -20 YRS AGO  . BACK SURGERY    . IR PERC CHOLECYSTOSTOMY  12/20/2020  . PORTACATH PLACEMENT Left 08/29/2019   Procedure: INSERTION PORT-A-CATH;  Surgeon: Nestor Lewandowsky, MD;  Location: [facility] ORS;  Service: General;  Laterality: Left;     FAMILY HISTORY   History reviewed. No pertinent family history.   SOCIAL HISTORY   Social History   Tobacco Use  . Smoking status: Former    Packs/day: 0.50    Types: Cigarettes  .  Smokeless tobacco: Never  . Tobacco comments:    not smoked since 6 weeks ago  Vaping Use  . Vaping Use: Never used  Substance Use Topics  . Alcohol use: Yes    Comment: 84 OZ IN WEEK  . Drug use: Not Currently    Types: Heroin    Comment: heroin WITHIN PAST YEAR     MEDICATIONS    Home Medication:    Current Medication:  Current Facility-Administered Medications:  .  0.9 %  sodium chloride infusion (Manually program via Guardrails IV Fluids), , Intravenous, Once, Sharen Hones, MD .  0.9 %  sodium chloride infusion, , Intravenous, PRN, Sharen Hones, MD, Stopped at 12/25/20 1558 .  0.9 %  sodium chloride infusion, , , Continuous PRN, Criselda Peaches, MD, Last Rate: 10 mL/hr at 12/20/20 1220, 10 mL/hr at 12/20/20 1220 .  acetaminophen (TYLENOL) tablet 650 mg, 650 mg, Oral, Q6H PRN **OR** acetaminophen (TYLENOL) suppository 650 mg, 650 mg, Rectal, Q6H PRN, Cox, Amy N, DO .  cefTRIAXone (ROCEPHIN) 2 g in sodium chloride 0.9 % 100 mL IVPB, 2 g, Intravenous, Q24H, Sharen Hones, MD, Last Rate: 200 mL/hr at 12/27/20 0605, 2 g at 12/27/20 0605 .  chlorhexidine (PERIDEX) 0.12 % solution 10 mL, 10  mL, Mouth/Throat, BID, Earlie Server, MD, 10 mL at 12/24/20 2017 .  Chlorhexidine Gluconate Cloth 2 % PADS 6 each, 6 each, Topical, Daily, Cox, Amy N, DO, 6 each at 12/26/20 0930 .  diltiazem (CARDIZEM CD) 24 hr capsule 240 mg, 240 mg, Oral, Daily, Cox, Amy N, DO, 240 mg at 12/26/20 0944 .  diltiazem (CARDIZEM) tablet 30 mg, 30 mg, Oral, Q6H PRN, Cyndia Skeeters, Taye T, MD .  dronabinol (MARINOL) capsule 2.5 mg, 2.5 mg, Oral, BID AC, Sharen Hones, MD, 2.5 mg at 12/24/20 1655 .  folic acid (FOLVITE) tablet 1 mg, 1 mg, Oral, Daily, Sharen Hones, MD, 1 mg at 12/26/20 0936 .  guaiFENesin (MUCINEX) 12 hr tablet 1,200 mg, 1,200 mg, Oral, BID, Dorothe Pea, RPH, 1,200 mg at 12/26/20 2055 .  heparin ADULT infusion 100 units/mL (25000 units/27mL), 1,350 Units/hr, Intravenous, Continuous, Gonfa, Taye T, MD, Last  Rate: 13.5 mL/hr at 12/26/20 2339, 1,350 Units/hr at 12/26/20 2339 .  multivitamin with minerals tablet 1 tablet, 1 tablet, Oral, Daily, Sharen Hones, MD, 1 tablet at 12/26/20 0936 .  ondansetron (ZOFRAN) tablet 4 mg, 4 mg, Oral, Q6H PRN **OR** ondansetron (ZOFRAN) injection 4 mg, 4 mg, Intravenous, Q6H PRN, Cox, Amy N, DO .  oxyCODONE-acetaminophen (PERCOCET/ROXICET) 5-325 MG per tablet 1 tablet, 1 tablet, Oral, Q6H PRN, Mansy, Jan A, MD, 1 tablet at 12/24/20 0944 .  sodium chloride flush (NS) 0.9 % injection 10-40 mL, 10-40 mL, Intracatheter, Q12H, Max Sane, MD, 40 mL at 12/26/20 2215 .  sodium chloride flush (NS) 0.9 % injection 10-40 mL, 10-40 mL, Intracatheter, PRN, Manuella Ghazi, Vipul, MD .  sodium chloride flush (NS) 0.9 % injection 5 mL, 5 mL, Intracatheter, Q8H, Criselda Peaches, MD, 5 mL at 12/27/20 0610 .  thiamine tablet 100 mg, 100 mg, Oral, Daily, Sharen Hones, MD, 100 mg at 12/26/20 0935  Facility-Administered Medications Ordered in Other Encounters:  .  sodium chloride flush (NS) 0.9 % injection 10 mL, 10 mL, Intravenous, PRN, Mike Gip, Melissa C, MD, 10 mL at 11/11/19 3295    ALLERGIES   Patient has no known allergies.     REVIEW OF SYSTEMS    Review of Systems:  Gen:  Denies  fever, sweats, chills weigh loss  HEENT: Denies blurred vision, double vision, ear pain, eye pain, hearing loss, nose bleeds, sore throat Cardiac:  No dizziness, chest pain or heaviness, chest tightness,edema Resp:   Denies cough or sputum porduction, shortness of breath,wheezing, hemoptysis,  Gi: Denies swallowing difficulty, stomach pain, nausea or vomiting, diarrhea, constipation, bowel incontinence Gu:  Denies bladder incontinence, burning urine Ext:   Denies Joint pain, stiffness or swelling Skin: Denies  skin rash, easy bruising or bleeding or hives Endoc:  Denies polyuria, polydipsia , polyphagia or weight change Psych:   Denies depression, insomnia or hallucinations   Other:  All  other systems negative   VS: BP (!) 129/91 (BP Location: Left Arm)   Pulse (!) 103   Temp (!) 97.5 F (36.4 C)   Resp 16   Ht 6' (1.829 m)   Wt 70.8 kg   SpO2 95%   BMI 21.17 kg/m      PHYSICAL EXAM    GENERAL:NAD, no fevers, chills, no weakness no fatigue HEAD: Normocephalic, atraumatic.  EYES: Pupils equal, round, reactive to light. Extraocular muscles intact. No scleral icterus.  MOUTH: Moist mucosal membrane. Dentition intact. No abscess noted.  EAR, NOSE, THROAT: Clear without exudates. No external lesions.  NECK: Supple. No thyromegaly. No  nodules. No JVD.  PULMONARY: Decreased BS on left CARDIOVASCULAR: S1 and S2. Regular rate and rhythm. No murmurs, rubs, or gallops. No edema. Pedal pulses 2+ bilaterally.  GASTROINTESTINAL: Soft, nontender, nondistended. No masses. Positive bowel sounds. No hepatosplenomegaly.  MUSCULOSKELETAL: No swelling, clubbing, or edema. Range of motion full in all extremities.  NEUROLOGIC: Cranial nerves II through XII are intact. No gross focal neurological deficits. Sensation intact. Reflexes intact.  SKIN: No ulceration, lesions, rashes, or cyanosis. Skin warm and dry. Turgor intact.  PSYCHIATRIC: Mood, affect within normal limits. The patient is awake, alert and oriented x 3. Insight, judgment intact.       IMAGING    CT ABDOMEN PELVIS WO CONTRAST  Result Date: 12/17/2020 CLINICAL DATA:  Nausea and vomiting EXAM: CT ABDOMEN AND PELVIS WITHOUT CONTRAST TECHNIQUE: Multidetector CT imaging of the abdomen and pelvis was performed following the standard protocol without IV contrast. COMPARISON:  CT 06/23/2020, PET CT 11/19/2020 FINDINGS: Lower chest: Lung bases demonstrate emphysema. Subpleural bandlike airspace disease at the left lower lobe redemonstrated. Incompletely visualized subpleural peripheral left upper lobe nodule measuring 11 mm, series 5, image 1. Cardiomegaly with trace pericardial effusion. Coronary vascular calcification.  Hepatobiliary: Extensive motion degradation. No focal hepatic abnormality. Gallstones. Gallbladder slightly distended. Possible wall thickening or pericholecystic fluid though limited by motion Pancreas: Unremarkable. No pancreatic ductal dilatation or surrounding inflammatory changes. Spleen: Normal in size without focal abnormality. Adrenals/Urinary Tract: Thickened appearance of adrenal glands. No hydronephrosis. The bladder is unremarkable Stomach/Bowel: Stomach nonenlarged. No dilated small bowel. Negative appendix. No acute bowel wall thickening Vascular/Lymphatic: Advanced aortic atherosclerosis. No aneurysm. Slightly increased multiple small retroperitoneal lymph nodes. Reproductive: Prostate is unremarkable. Other: Negative for free air or free fluid Musculoskeletal: No acute osseous abnormality. IMPRESSION: 1. Degraded by motion. 2. Gallbladder appears slightly distended and there are gallstones. Possible gallbladder wall thickening or pericholecystic fluid though limited by motion, consider correlation with ultrasound 3. Cardiomegaly with trace pericardial effusion. 4. Similar subpleural bandlike density at left lower lobe. Incompletely visualized peripheral left upper lobe lung nodule characterized on recent PET CT Electronically Signed   By: Donavan Foil M.D.   On: 12/17/2020 16:50   CT ANGIO HEAD NECK W WO CM  Result Date: 12/21/2020 CLINICAL DATA:  Neuro deficit, acute, stroke suspected. Neutropenic sepsis. EXAM: CT ANGIOGRAPHY HEAD AND NECK TECHNIQUE: Multidetector CT imaging of the head and neck was performed using the standard protocol during bolus administration of intravenous contrast. Multiplanar CT image reconstructions and MIPs were obtained to evaluate the vascular anatomy. Carotid stenosis measurements (when applicable) are obtained utilizing NASCET criteria, using the distal internal carotid diameter as the denominator. CONTRAST:  71mL OMNIPAQUE IOHEXOL 350 MG/ML SOLN COMPARISON:  MRI  of the brain December 20, 2020. FINDINGS: CT HEAD FINDINGS Brain: Confluent hypodensity of the white matter of the cerebral hemispheres, nonspecific, most likely related to chronic small vessel ischemia. Foci of acute infarct are more conspicuous on recent MRI of the brain. No new large territorial infarct. No hemorrhage, hydrocephalus, mass or midline shift. Vascular: No hyperdense vessel or unexpected calcification. Skull: Normal. Negative for fracture or focal lesion. Sinuses: Increased thickness of the walls of the bilateral maxillary sinuses with bubbly secretion on the left. Findings are suggestive of chronic sinusitis. Right mastoid effusion. Orbits: No acute finding. Review of the MIP images confirms the above findings CTA NECK FINDINGS Aortic arch: Standard branching. Imaged portion shows no evidence of aneurysm or dissection. Mild atherosclerotic changes of the aortic arch without significant stenosis of  the major arch vessel origins. Right carotid system: Calcified atherosclerotic disease of the right carotid bifurcation without hemodynamically significant stenosis. Left carotid system: Atherosclerotic disease of the left carotid bifurcation with mixed density plaques without hemodynamically significant stenosis. Vertebral arteries: Calcified atherosclerotic plaque at the origin of the right vertebral artery without hemodynamically significant stenosis. Mild atherosclerotic changes at the origin of the left vertebral artery without hemodynamically significant stenosis. Cervical vertebral arteries otherwise have normal caliber Skeleton: Degenerative changes of the cervical spine with fusion of the facet joints at C2-3 on the left. No acute findings. Other neck: Negative. Upper chest: Left-sided pleural effusion and atelectasis. Mediastinal lymphadenopathy. Review of the MIP images confirms the above findings CTA HEAD FINDINGS Anterior circulation: Calcified atherosclerotic plaques in the bilateral carotid  siphons. No significant stenosis, proximal occlusion, aneurysm, or vascular malformation. Posterior circulation: No significant stenosis, proximal occlusion, aneurysm, or vascular malformation. Hypoplastic/aplastic right P1/PCA segment with right fetal PCA. Venous sinuses: As permitted by contrast timing, patent. Anatomic variants: Right fetal PCA. Review of the MIP images confirms the above findings IMPRESSION: 1. Advanced chronic microvascular ischemic changes of the white matter. 2. Atherosclerotic disease of the bilateral carotid bifurcation without hemodynamically significant stenosis. 3. Mild atherosclerotic changes at the origin of the bilateral vertebral arteries without hemodynamically significant stenosis. 4. Mild intracranial atherosclerotic disease without hemodynamically significant stenosis. Electronically Signed   By: Pedro Earls M.D.   On: 12/21/2020 15:32   DG Chest 1 View  Result Date: 12/25/2020 CLINICAL DATA:  Thoracentesis.  Lung cancer EXAM: CHEST  1 VIEW COMPARISON:  Radiograph 12/25/2020 FINDINGS: Interval reduction in LEFT pleural fluid following thoracentesis. No pneumothorax appreciated. A small basilar effusion remains. RIGHT lung clear. Port in the anterior chest wall with tip in distal SVC. IMPRESSION: 1. No pneumothorax appreciated following LEFT thoracentesis. 2. Considerable reduction in LEFT pleural fluid. Electronically Signed   By: Suzy Bouchard M.D.   On: 12/25/2020 17:28   CT HEAD WO CONTRAST (5MM)  Result Date: 12/17/2020 CLINICAL DATA:  Nausea vomiting EXAM: CT HEAD WITHOUT CONTRAST TECHNIQUE: Contiguous axial images were obtained from the base of the skull through the vertex without intravenous contrast. COMPARISON:  CT brain 06/24/2020, MRI 03/10/2020, head CT 11/22/2019 FINDINGS: Brain: No acute territorial infarction, hemorrhage or intracranial mass. Extensive white matter disease, progressed from more remote exams from 2021. Stable ventricle  size. Chronic lacunar infarcts within the left white matter. Vascular: No hyperdense vessel.  Carotid vascular calcification Skull: Normal. Negative for fracture or focal lesion. Sinuses/Orbits: Mucosal thickening the sinuses. Chronic appearing nasal deformity Other: None IMPRESSION: 1. No definite CT evidence for acute intracranial abnormality. 2. Atrophy. Extensive white matter disease, grossly stable as compared with recent head CT from March, appears progressive when compared to exams from 2021 and prior Electronically Signed   By: Donavan Foil M.D.   On: 12/17/2020 16:39   MR BRAIN W WO CONTRAST  Addendum Date: 12/21/2020   ADDENDUM REPORT: 12/21/2020 14:49 ADDENDUM: Patient returns for repeat postcontrast imaging. There is no abnormal enhancement. No evidence of intracranial metastatic disease. Electronically Signed   By: Macy Mis M.D.   On: 12/21/2020 14:49   Result Date: 12/21/2020 CLINICAL DATA:  Neuro deficit, acute, stroke suspected Small cell lung cancer, monitor EXAM: MRI HEAD WITHOUT AND WITH CONTRAST TECHNIQUE: Multiplanar, multiecho pulse sequences of the brain and surrounding structures were obtained without and with intravenous contrast. CONTRAST:  7.29mL GADAVIST GADOBUTROL 1 MMOL/ML IV SOLN COMPARISON:  CT head 12/17/2020. FINDINGS: Mildly  motion limited exam.  Within this limitation: Brain: Multiple small areas of restricted diffusion in the bilateral frontal and occipital, and left parietal matter, left perirolandic cortex, left thalamocapsular region, and left cerebellum. Advanced confluent T2 hyperintensity within the white matter, nonspecific but compatible with chronic microvascular ischemic disease and progressed since 2021. Small remote infarct in the left corona radiata. No hydrocephalus, obvious mass lesion, midline shift, acute hemorrhage, or extra-axial fluid collection. Punctate foci of susceptibility artifact in the left cerebellum and left frontal white matter,  nonspecific but potentially related to prior microhemorrhage. Contrast was administered; however, there is no evidence of enhancement (for example, the nasal mucosa/turbinates are nonenhancing). This precludes evaluation for enhancing lesions. Vascular: Major arterial flow voids are maintained at the skull base. Skull and upper cervical spine: Normal marrow signal. Sinuses/Orbits: Mild paranasal sinus disease. Other: Moderate right and small left mastoid effusions. IMPRESSION: 1. Multiple small areas of restricted diffusion in the bilateral frontal and occipital and left parietal white matter, left perirolandic cortex, left thalamocapsular region, and left cerebellum, which are concerning for acute infarcts. Given involvement of multiple vascular territories, consider a central embolic etiology. 2. Contrast was administered; however, there is no evidence of enhancement, unclear why given reported uneventful injection per the technologist. This precludes evaluation for enhancing lesions. Given the patient's known small cell lung cancer diagnosis, recommend repeat postcontrast imaging to exclude metastatic disease. I've asked the MRI technologist to not bill the patient for the repeat post-contrast imaging. 3. Advanced chronic microvascular ischemic disease, progressed since 2021. 4. Moderate right and small left mastoid effusions. Electronically Signed: By: Margaretha Sheffield M.D. On: 12/20/2020 14:29   IR Perc Cholecystostomy  Result Date: 12/20/2020 INDICATION: 70 year old male with small cell lung cancer and chemotherapy induced pancytopenia presenting with neutropenic sepsis. Blood cultures grew out Klebsiella and imaging findings are concerning for acute cholecystitis. He is too sick and frail to be considered an operative candidate. Following transfusion of platelets, his platelets are now at an acceptable level to proceed with percutaneous cholecystostomy tube placement. EXAM: CHOLECYSTOSTOMY MEDICATIONS:  In patient currently receiving intravenous antibiotic therapy. No additional antibiotic prophylaxis administered. ANESTHESIA/SEDATION: Moderate (conscious) sedation was employed during this procedure. A total of Versed 1 mg and Fentanyl 50 mcg and 0.5 mg Dilaudid administered intravenously. Moderate Sedation Time: 13 minutes. The patient's level of consciousness and vital signs were monitored continuously by radiology nursing throughout the procedure under my direct supervision. FLUOROSCOPY TIME:  Fluoroscopy Time: 0 minutes 30 seconds (4.3 mGy). COMPLICATIONS: None immediate. PROCEDURE: Informed written consent was obtained from the patient after a thorough discussion of the procedural risks, benefits and alternatives. All questions were addressed. Maximal Sterile Barrier Technique was utilized including caps, mask, sterile gowns, sterile gloves, sterile drape, hand hygiene and skin antiseptic. A timeout was performed prior to the initiation of the procedure. The right upper quadrant was interrogated with ultrasound. The distended gallbladder with a thickened wall was successfully identified. There is significant excursion of the liver with respiration due to use of abdominal muscles. A suitable skin entry site was selected. Local anesthesia was attained by infiltration with 1% lidocaine. A small dermatotomy was made. Under real-time ultrasound guidance, the movement of the liver was timed with respirations and in a single pass, a 21 gauge Accustick needle was advanced through the liver and into the gallbladder lumen along a short transhepatic course. A 0.018 wire was quickly advanced into the gallbladder lumen and the needle was exchanged for the transitional dilator. The transitional dilator  was advanced into the gallbladder lumen. The wire was removed. Contrast injection was performed under fluoroscopy confirming opacification of the gallbladder lumen. Multiple filling defects consistent with cholelithiasis. The  cystic duct is occluded. A 0.035 wire was then coiled within the gallbladder lumen. The transitional dilator was removed. The transhepatic tract was dilated to 10 [place] with a stiff dilator. A Cook 10.2 [place] all-purpose drainage catheter was then advanced over the wire and formed in the gallbladder lumen. Images were obtained and stored for the medical record. The catheter was gently flushed and connected to gravity bag drainage before being secured to the skin with 0 silk suture. IMPRESSION: Successful placement of 10 French transhepatic percutaneous cholecystostomy tube for an indication of calculus cholecystitis. PLAN: Maintain tube to gravity bag drainage. Patient will likely need home health to assist with care and cleaning of the tube site. Return to interventional radiology approximately every 8 weeks for cholecystostomy tube check and exchange. If patient's clinical status improves in the future, elective cholecystectomy would be optimal. If patient remains a poor operative candidate, long-term cholecystostomy tube maintenance will likely be required. Electronically Signed   By: Jacqulynn Cadet M.D.   On: 12/20/2020 13:50   DG Chest Port 1 View  Result Date: 12/25/2020 CLINICAL DATA:  Shortness of breath EXAM: PORTABLE CHEST 1 VIEW COMPARISON:  Chest x-ray 12/17/2020, PET CT 11/19/2020 FINDINGS: Left-sided central venous port tip over right atrium. Interval complete opacification of left thorax, suspect that there is at least moderate pleural effusion. Cardiomediastinal silhouette is obscured. No pneumothorax is visualized. IMPRESSION: Interval opacification of the entire left thorax which may be due to combination of pleural effusion/atelectasis/airspace disease. Electronically Signed   By: Donavan Foil M.D.   On: 12/25/2020 15:19   DG Chest Port 1 View  Result Date: 12/17/2020 CLINICAL DATA:  Questionable sepsis.  Weakness EXAM: PORTABLE CHEST 1 VIEW COMPARISON:  03/09/2020 FINDINGS: Left  subclavian approached chest port remains in place. Stable cardiomegaly. Chronic left basilar scarring or atelectasis. Peripheral left upper lobe nodule better seen on prior CT. No new airspace consolidation. Pleural effusion or pneumothorax. IMPRESSION: Chronic left basilar scarring or atelectasis. No new or acute findings. Electronically Signed   By: Davina Poke D.O.   On: 12/17/2020 13:20   DG Swallowing Func-Speech Pathology  Result Date: 12/25/2020 Table formatting from the original result was not included. Objective Swallowing Evaluation: Type of Study: MBS-Modified Barium Swallow Study  Patient Details Name: KENIEL RALSTON MRN: [id number] Date of Birth: January 03, 1951 Today's Date: 12/25/2020 Time: SLP Start Time (ACUTE ONLY): 21 -SLP Stop Time (ACUTE ONLY): 1300 SLP Time Calculation (min) (ACUTE ONLY): 30 min Past Medical History: Past Medical History: Diagnosis Date . A-fib ([place]) 01/10/2019  pt st this was dx by Dr. Ubaldo Glassing . Cancer ([place]) 2021  LUNG . Complication of anesthesia   DIFFICULTY WAKING UP AFTER SURGERY- 20 YRS AGO . Hypertension  . Lung cancer ([place])  . Substance abuse [facility])  Past Surgical History: Past Surgical History: Procedure Laterality Date . ARM DEBRIDEMENT Left   INCISION AND DEBRIDEMENT LOWER ARM -20 YRS AGO . BACK SURGERY   . IR PERC CHOLECYSTOSTOMY  12/20/2020 . PORTACATH PLACEMENT Left 08/29/2019  Procedure: INSERTION PORT-A-CATH;  Surgeon: Nestor Lewandowsky, MD;  Location: [facility] ORS;  Service: General;  Laterality: Left; HPI: Pt is a 70 year old male with PMH of 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 fib not on AC, chronic hep C, chronic cancer pain, HTN, alcohol abuse,  malnutrition and polysubstance who is admitted with neutropenic sepsis due to acute cholecystitis and Klebsiella bacteremia now s/p cholecystostomy on 9/11 by IR. Hospital admission complicated by acute thromboembolic stroke with new right-sided weakness-confirmed on MRI brain on  12/20/2020.  MRI: Multiple small areas of restricted diffusion in the bilateral  frontal and occipital and left parietal white matter, left  perirolandic cortex, left thalamocapsular region, and left  cerebellum, which are concerning for acute infarcts.  CXR at admit: Chronic left basilar scarring or atelectasis. No new or acute  findings.  Pt is followed by Palliative Care at [facility].  Subjective: pt able to communicate his wants/needs Assessment / Plan / Recommendation CHL IP CLINICAL IMPRESSIONS 12/25/2020 Clinical Impression Pt presents with adequate airway protection while consuming thin liquids, nectar thick liquids, puree and whole barium tablet with thin liquids. Pt's oral phase is unremarkable and his swallow response is timely with good airway protection. His overall medical comorbidities and respiratory decline place him at an overall increased risk of aspiration. For example, when consuming consecutive sips of thin liquids, pt became short of breath. At this time, recommend pt continue consuming dysphagia 3 diet for energy conservation with thin liquids, medicine whole with thin liquids. Pt needs to follow strict aspiration precautions such as taking single sips and rest breaks. SLP Visit Diagnosis Dysphagia, oropharyngeal phase (R13.12) Attention and concentration deficit following -- Frontal lobe and executive function deficit following -- Impact on safety and function Mild aspiration risk;Moderate aspiration risk   [facility] IP TREATMENT RECOMMENDATION 12/25/2020 Treatment Recommendations No treatment recommended at this time   Prognosis 12/24/2020 Prognosis for Safe Diet Advancement Fair Barriers to Reach Goals Motivation;Time post onset;Severity of deficits;Behavior Barriers/Prognosis Comment -- [facility] IP DIET RECOMMENDATION 12/25/2020 SLP Diet Recommendations Dysphagia 3 (Mech soft) solids;Thin liquid Liquid Administration via Cup Medication Administration Whole meds with liquid Compensations Minimize  environmental distractions;Slow rate;Small sips/bites Postural Changes Remain semi-upright after after feeds/meals (Comment);Seated upright at 90 degrees   [facility] IP OTHER RECOMMENDATIONS 12/25/2020 Recommended Consults -- Oral Care Recommendations Oral care BID Other Recommendations --   [facility] IP FOLLOW UP RECOMMENDATIONS 12/25/2020 Follow up Recommendations None   [facility] IP FREQUENCY AND DURATION 12/24/2020 Speech Therapy Frequency (ACUTE ONLY) min 3x week Treatment Duration 2 weeks      [facility] IP ORAL PHASE 12/25/2020 Oral Phase WFL Oral - Pudding Teaspoon -- Oral - Pudding Cup -- Oral - Honey Teaspoon -- Oral - Honey Cup -- Oral - Nectar Teaspoon -- Oral - Nectar Cup -- Oral - Nectar Straw -- Oral - Thin Teaspoon -- Oral - Thin Cup -- Oral - Thin Straw -- Oral - Puree -- Oral - Mech Soft -- Oral - Regular -- Oral - Multi-Consistency -- Oral - Pill -- Oral Phase - Comment --  [facility] IP PHARYNGEAL PHASE 12/25/2020 Pharyngeal Phase WFL Pharyngeal- Pudding Teaspoon -- Pharyngeal -- Pharyngeal- Pudding Cup -- Pharyngeal -- Pharyngeal- Honey Teaspoon -- Pharyngeal -- Pharyngeal- Honey Cup -- Pharyngeal -- Pharyngeal- Nectar Teaspoon -- Pharyngeal -- Pharyngeal- Nectar Cup -- Pharyngeal -- Pharyngeal- Nectar Straw -- Pharyngeal -- Pharyngeal- Thin Teaspoon -- Pharyngeal -- Pharyngeal- Thin Cup -- Pharyngeal -- Pharyngeal- Thin Straw -- Pharyngeal -- Pharyngeal- Puree -- Pharyngeal -- Pharyngeal- Mechanical Soft -- Pharyngeal -- Pharyngeal- Regular -- Pharyngeal -- Pharyngeal- Multi-consistency -- Pharyngeal -- Pharyngeal- Pill -- Pharyngeal -- Pharyngeal Comment --  [facility] IP CERVICAL ESOPHAGEAL PHASE 12/25/2020 Cervical Esophageal Phase WFL Pudding Teaspoon -- Pudding Cup -- Honey Teaspoon -- Honey Cup -- Nectar Teaspoon --  Nectar Cup -- Nectar Straw -- Thin Teaspoon -- Thin Cup -- Thin Straw -- Puree -- Mechanical Soft -- Regular -- Multi-consistency -- Pill -- Cervical Esophageal Comment -- Happi B. [name] M.S., CCC-SLP, [place] Office [phone] Stormy Fabian 12/25/2020, 2:17 PM              ECHOCARDIOGRAM COMPLETE  Result Date: 12/21/2020    ECHOCARDIOGRAM REPORT   Patient Name:   VETO MACQUEEN Date of Exam: 12/21/2020 Medical Rec #:  [id number]    Height:       72.0 in Accession #:    [id number]   Weight:       137.1 lb Date of Birth:  August 02, 1950     BSA:          1.815 m Patient Age:    31 years     BP:           114/80 mmHg Patient Gender: M            HR:           89 bpm. Exam Location:  [facility] Procedure: 2D Echo, Color Doppler and Cardiac Doppler Indications:     Endocarditis I38  History:         Patient has prior history of Echocardiogram examinations, most                  recent 08/19/2019. Arrythmias:Atrial Fibrillation; Risk                  Factors:Hypertension. Substance abuse.  Sonographer:     Sherrie Sport Referring Phys:  [place] Diagnosing Phys: Serafina Royals MD IMPRESSIONS  1. Left ventricular ejection fraction, by estimation, is 60 to 65%. The left ventricle has normal function. The left ventricle has no regional wall motion abnormalities. Left ventricular diastolic parameters were normal.  2. Right ventricular systolic function is normal. The right ventricular size is normal.  3. The mitral valve is normal in structure. Trivial mitral valve regurgitation.  4. The aortic valve is normal in structure. Aortic valve regurgitation is not visualized. Mild aortic valve sclerosis is present, with no evidence of aortic valve stenosis. Conclusion(s)/Recommendation(s): No evidence of valve vegetations. FINDINGS  Left Ventricle: Left ventricular ejection fraction, by estimation, is 60 to 65%. The left ventricle has normal function. The left ventricle has no regional wall motion abnormalities. The left ventricular internal cavity size was small. There is no left ventricular hypertrophy. Left ventricular diastolic parameters were normal. Right Ventricle: The right  ventricular size is normal. No increase in right ventricular wall thickness. Right ventricular systolic function is normal. Left Atrium: Left atrial size was normal in size. Right Atrium: Right atrial size was normal in size. Pericardium: There is no evidence of pericardial effusion. Mitral Valve: The mitral valve is normal in structure. Trivial mitral valve regurgitation. Tricuspid Valve: The tricuspid valve is normal in structure. Tricuspid valve regurgitation is trivial. Aortic Valve: The aortic valve is normal in structure. Aortic valve regurgitation is not visualized. Mild aortic valve sclerosis is present, with no evidence of aortic valve stenosis. Aortic valve mean gradient measures 3.0 mmHg. Aortic valve peak gradient measures 5.9 mmHg. Aortic valve area, by VTI measures 2.30 cm. Pulmonic Valve: The pulmonic valve was normal in structure. Pulmonic valve regurgitation is not visualized. Aorta: The aortic root and ascending aorta are structurally normal, with no evidence of dilitation. IAS/Shunts: No atrial level shunt detected by color flow Doppler.  LEFT  VENTRICLE PLAX 2D LVIDd:         3.87 cm LVIDs:         2.30 cm LV PW:         1.00 cm LV IVS:        1.18 cm LVOT diam:     2.10 cm LV SV:         44 LV SV Index:   24 LVOT Area:     3.46 cm  RIGHT VENTRICLE RV Basal diam:  3.70 cm RV S prime:     12.40 cm/s TAPSE (M-mode): 3.7 cm LEFT ATRIUM           Index       RIGHT ATRIUM           Index LA diam:      3.50 cm 1.93 cm/m  RA Area:     33.70 cm LA Vol (A2C): 57.4 ml 31.63 ml/m RA Volume:   128.00 ml 70.53 ml/m LA Vol (A4C): 72.1 ml 39.73 ml/m  AORTIC VALVE                   PULMONIC VALVE AV Area (Vmax):    2.34 cm    PV Vmax:        0.60 m/s AV Area (Vmean):   2.32 cm    PV Peak grad:   1.4 mmHg AV Area (VTI):     2.30 cm    RVOT Peak grad: 2 mmHg AV Vmax:           121.00 cm/s AV Vmean:          86.000 cm/s AV VTI:            0.193 m AV Peak Grad:      5.9 mmHg AV Mean Grad:      3.0 mmHg LVOT  Vmax:         81.60 cm/s LVOT Vmean:        57.600 cm/s LVOT VTI:          0.128 m LVOT/AV VTI ratio: 0.66  AORTA Ao Root diam: 3.50 cm MITRAL VALVE               TRICUSPID VALVE MV Area (PHT): 4.52 cm    TR Peak grad:   24.4 mmHg MV Decel Time: 168 msec    TR Vmax:        247.00 cm/s MV E velocity: 88.30 cm/s                            SHUNTS                            Systemic VTI:  0.13 m                            Systemic Diam: 2.10 cm Serafina Royals MD Electronically signed by Serafina Royals MD Signature Date/Time: 12/21/2020/5:20:09 PM    Final    US Abdomen Limited RUQ (LIVER/GB)  Result Date: 12/18/2020 CLINICAL DATA:  Evaluate for cholecystitis. EXAM: ULTRASOUND ABDOMEN LIMITED RIGHT UPPER QUADRANT COMPARISON:  CT AP from 12/17/2020 FINDINGS: Gallbladder: The gallbladder wall is diffusely edematous measuring up to 13.1 mm. Gallstones are noted measuring up to 5.9 mm. Pericholecystic fluid. Positive sonographic Murphy's sign. Common bile duct: Diameter: 6 mm Liver: Diffusely increased parenchymal echogenicity  identified. No focal liver abnormality. Portal vein is patent on color Doppler imaging with normal direction of blood flow towards the liver. Other: 1.9 cm right kidney cyst. IMPRESSION: Gallstones, gallbladder wall thickening and pericholecystic fluid with positive sonographic Murphy's sign. Findings are compatible with acute cholecystitis. Electronically Signed   By: Kerby Moors M.D.   On: 12/18/2020 14:18   US THORACENTESIS ASP PLEURAL SPACE W/IMG GUIDE  Result Date: 12/25/2020 INDICATION: Large left pleural effusion secondary to known lung cancer. Request for diagnostic and therapeutic thoracentesis. EXAM: ULTRASOUND GUIDED LEFT THORACENTESIS MEDICATIONS: 1% lidocaine 10 mL COMPLICATIONS: None immediate. PROCEDURE: An ultrasound guided thoracentesis was thoroughly discussed with the patient and questions answered. The benefits, risks, alternatives and complications were also discussed.  The patient understands and wishes to proceed with the procedure. Written consent was obtained. Ultrasound was performed to localize and mark an adequate pocket of fluid in the left chest. The area was then prepped and draped in the normal sterile fashion. 1% Lidocaine was used for local anesthesia. Under ultrasound guidance a 6 Fr Safe-T-Centesis catheter was introduced. Thoracentesis was performed. The catheter was removed and a dressing applied. FINDINGS: A total of approximately 700 mL of hazy yellow fluid was removed. Samples were sent to the laboratory as requested by the clinical team. IMPRESSION: Successful ultrasound guided left thoracentesis yielding 700 mL of pleural fluid. Read by: Gareth Eagle, PA-C Electronically Signed   By: Michaelle Birks M.D.   On: 12/25/2020 16:44      ASSESSMENT/PLAN   Acute on chronic hypoxemic respiratory failure There is complete atelectasis of left lung secondary to left pleural effusion     - stop heparin transiently and perform diagnostic and therapeutic thoracentesis   - Pleural fluid studies ordered both infections and malignant workup    - Reviewed with patient he is agreeable to plan -metaneb bid with RT  Sepsis with Klebsiella bacteremia  On IV antibiotics    - ID on case appreciate input   Extensive stage small cell lung ca Currently on chemo/immunotherapy  - oncology on case - prognosis poor - appreciate input   Thank you for allowing me to participate in the care of this patient.  Total face to face encounter time for this patient visit was >30min. >50% of the time was  spent in counseling and coordination of care.   Patient/Family are satisfied with care plan and all questions have been answered.  This document was prepared using Dragon voice recognition software and may include unintentional dictation errors.     Ottie Glazier, M.D.  Division of [place]

## 2020-12-27 NOTE — Progress Notes (Signed)
PROGRESS NOTE  Scott Jordan:[id number] DOB: November 19, 1950   PCP: Earlie Server, MD  Patient is from: Home.   DOA: 12/17/2020 LOS: 10  Chief complaints:  Chief Complaint  Patient presents with   Emesis     Brief Narrative / Interim history: 70 year old M with PMH of stage IV small cell lung cancer on chemo, chemo induced pancytopenia, A. fib not on AC, chronic hep C, chronic cancer pain, HTN, alcohol abuse, malnutrition and polysubstance use presenting with nausea and vomiting and admitted with neutropenic sepsis due to acute cholecystitis and Klebsiella bacteremia.  He had cholecystostomy on 9/11 by IR.  Started on IV ceftriaxone and IV Flagyl.  Neutropenia resolved but he now has leukocytosis.  Infectious disease consulted  Hospital course complicated by acute thromboembolic CVA with new right-sided weakness for which she was evaluated by neurology and started on aspirin.  Eventually, thrombocytopenia improved and he was transitioned to Eliquis.   Patient continues to be deconditioned physically with poor p.o. intake.  Palliative medicine consulted.  CODE STATUS changed to DNR and DNI.  Family interested in feeding tube to optimize his nutrition so he can follow-up with oncology for chemotherapy.  IR consulted.  G-tube planned for early next week.   Subjective: Seen and examined earlier this morning.  No major events overnight of this morning.  No complaints but weak.  Denies chest pain, shortness of breath, palpitation, dizziness, GI or UTI symptoms.  Voiding without problem after Foley was discontinued.  Objective: Vitals:   12/27/20 0443 12/27/20 0955 12/27/20 0955 12/27/20 1219  BP: (!) 129/91 113/77  126/84  Pulse: (!) 103 (!) 116 (!) 110 99  Resp: 16 (!) 22  (!) 24  Temp: (!) 97.5 F (36.4 C) 98.4 F (36.9 C)  97.8 F (36.6 C)  TempSrc:  Oral  Oral  SpO2: 95% 96% 96% 95%  Weight:      Height:        Intake/Output Summary (Last 24 hours) at 12/27/2020 1455 Last data filed  at 12/27/2020 1406 Gross per 24 hour  Intake 5 ml  Output 665 ml  Net -660 ml   Filed Weights   12/17/20 1203 12/18/20 2130 12/24/20 1428  Weight: 61.7 kg 62.2 kg 70.8 kg    Examination:  GENERAL: Frail and chronically ill-appearing. HEENT: MMM.  Vision and hearing grossly intact.  NECK: Supple.  No apparent JVD.  RESP: 95% on RA.  No IWOB.  Fair aeration bilaterally. CVS:  RRR. Heart sounds normal.  ABD/GI/GU: BS+. Abd soft, NTND.  Biliary PERC drain in place. MSK/EXT:  Moves extremities. No apparent deformity. No edema.  SKIN: no apparent skin lesion or wound NEURO: Awake and alert. Oriented appropriately.  No apparent focal neuro deficit. PSYCH: Calm.  Somewhat withdrawn.  Procedures:  9/11-percutaneous biliary drain by IR 9/16-left-sided thoracocentesis with removal of 700 cc fluid  Microbiology summarized: 9/8-COVID-19 and influenza PCR nonreactive. 9/8-C. difficile negative. 9/8-blood culture with pansensitive Klebsiella pneumonia except to ampicillin 9/8-urine culture NGTD 9/8-MRSA PCR screen positive 9/13-repeat blood cultures NGTD 9/16-pleural fluid cultures NGTD 9/16-pleural fluid AFS/AFC pending.   Assessment & Plan: Neutropenic sepsis-neutropenia resolved. Septicemia secondary to Klebsiella pneumoniae bacteremia. Cholelithiasis with cholecystitis secondary to Klebsiella pneumoniae. -Culture data as above. -S/p percutaneous biliary drain by IR on 9/11. -TTE negative for vegetation.  -Continue IV ceftriaxone 9/9>>> for a total of 10 days. -Received Flagyl from 9/9-9/17 -ID recommends 10 days of ceftriaxone.   Stage IV small cell lung cancer with osseous mets-s/p 1  cycle of chemo Pancytopenia related to chemotherapy-improved.  Now with leukocytosis. -Received 3 units of platelets this hospitalization. -H&H stable.  Leukopenia and thrombocytopenia resolved.  Now with leukocytosis   Acute bilateral CVA-reportedly had right-sided hemiparesis although he  seems to have left facial droop and left hemiparesis on my exam.  Felt to be thromboembolic in the setting of his A. Fib.   -Cardiology, neurology and ID did not feel TEE is necessary.  -Started on Eliquis on 9/15>> IV heparin on 9/16 as a bridge for PEG tube placement.   Paroxysmal A. fib with RVR-on Cardizem and Eliquis at home.  RVR resolved. -Continue p.o. Cardizem CD 240 mg daily -P.o. Cardizem 30 mg every 6 hours as needed sustained HR greater than 120 -Anticoagulation as above  Left pleural effusion/left atelectasis: Likely malignant effusion.  S/p left thoracocentesis with removal of 700 cc on 9/16.  Fluid culture NGTD. -Appreciate help by pulmonology and IR -Follow other pleural fluid studies -Incentive spirometry, OOB, PT/OT   AKI/azotemia: Resolved. Recent Labs    12/17/20 1205 12/18/20 0500 12/19/20 0457 12/20/20 0500 12/21/20 0434 12/22/20 0513 12/23/20 0305 12/24/20 0506 12/25/20 0554 12/27/20 0615  BUN 31* 48* 43* 36* 30* 21 17 14 14 12   CREATININE 1.45* 1.46* 0.94 0.72 0.57* 0.59* 0.69 0.66 0.71 0.57*  -Monitor intermittently  Hypokalemia/hypomagnesemia K3.0.  Mg 1.7. -K-Dur 40 mill equivalent x2 -IV magnesium sulfate 2 g x 1  Hyponatremia: Resolved.   Alcohol use disorder: No withdrawal signs or symptoms.  Physical deconditioning/debility -Incentive spirometry, OOB/PT/OT  Urinary tension?  Foley discontinued.  Passed voiding trial on 9/17. -Monitor urine output   Goals of care-patient with stage IV small cell lung cancer, acute thromboembolic CVA and other comorbidity as above.  Very poor long-term prognosis.   -Appreciate help by palliative care.  Changed CODE STATUS to DNR/DNI  Dysphagia-SLP recommended dysphagia 3 diet. Severe protein calorie malnutrition/anorexia-significant muscle mass loss.  P.o. intake remains poor despite appetite stimulants and supplements.  Likely due to malignancy, chemo and acute illness.  Patient and family interested in  G-tube feeding.  IR consulted.  Plan for G-tube next week Body mass index is 21.17 kg/m. Nutrition Problem: Severe Malnutrition Etiology: cancer and cancer related treatments Signs/Symptoms: severe fat depletion, severe muscle depletion Interventions: Prostat, MVI, Ensure Enlive (each supplement provides 350kcal and 20 grams of protein)   Pressure skin injury: Pressure Injury 12/18/20 Heel Right Deep Tissue Pressure Injury - Purple or maroon localized area of discolored intact skin or blood-filled blister due to damage of underlying soft tissue from pressure and/or shear. (Active)  12/18/20 2200  Location: Heel  Location Orientation: Right  Staging: Deep Tissue Pressure Injury - Purple or maroon localized area of discolored intact skin or blood-filled blister due to damage of underlying soft tissue from pressure and/or shear.  Wound Description (Comments):   Present on Admission:      Pressure Injury 12/18/20 Heel Left Deep Tissue Pressure Injury - Purple or maroon localized area of discolored intact skin or blood-filled blister due to damage of underlying soft tissue from pressure and/or shear. (Active)  12/18/20 2200  Location: Heel  Location Orientation: Left  Staging: Deep Tissue Pressure Injury - Purple or maroon localized area of discolored intact skin or blood-filled blister due to damage of underlying soft tissue from pressure and/or shear.  Wound Description (Comments):   Present on Admission:    DVT prophylaxis:  Place and maintain sequential compression device Start: 12/18/20 1303 Place TED hose Start: 12/17/20 1523  Code Status: Full code Family Communication: Patient and/or RN.  Updated patient's niece over the phone on 9/16. Level of care: Med-Surg Status is: Inpatient  Remains inpatient appropriate because:Unsafe d/c plan, IV treatments appropriate due to intensity of illness or inability to take PO, and Inpatient level of care appropriate due to severity of  illness  Dispo: The patient is from: Home              Anticipated d/c is to: SNF              Patient currently is not medically stable to d/c.   Difficult to place patient No       Consultants:  Neurology-signed off Cardiology-signed off Oncology Palliative medicine Interventional radiology Pulmonology   Sch Meds:  Scheduled Meds:  sodium chloride   Intravenous Once   albuterol  2.5 mg Nebulization BID   chlorhexidine  10 mL Mouth/Throat BID   Chlorhexidine Gluconate Cloth  6 each Topical Daily   diltiazem  240 mg Oral Daily   dronabinol  2.5 mg Oral BID AC   folic acid  1 mg Oral Daily   guaiFENesin  1,200 mg Oral BID   multivitamin with minerals  1 tablet Oral Daily   sodium chloride flush  10-40 mL Intracatheter Q12H   sodium chloride flush  5 mL Intracatheter Q8H   thiamine  100 mg Oral Daily   Continuous Infusions:  sodium chloride Stopped (12/25/20 1558)   sodium chloride 10 mL/hr (12/20/20 1220)   cefTRIAXone (ROCEPHIN)  IV 2 g (12/27/20 0605)   heparin 1,500 Units/hr (12/27/20 1452)   PRN Meds:.sodium chloride, sodium chloride, acetaminophen **OR** acetaminophen, diltiazem, ondansetron **OR** ondansetron (ZOFRAN) IV, oxyCODONE-acetaminophen, sodium chloride flush  Antimicrobials: Anti-infectives (From admission, onward)    Start     Dose/Rate Route Frequency Ordered Stop   12/21/20 1200  metroNIDAZOLE (FLAGYL) IVPB 500 mg  Status:  Discontinued        500 mg 100 mL/hr over 60 Minutes Intravenous Every 12 hours 12/21/20 1023 12/26/20 1450   12/19/20 0600  cefTRIAXone (ROCEPHIN) 2 g in sodium chloride 0.9 % 100 mL IVPB        2 g 200 mL/hr over 30 Minutes Intravenous Every 24 hours 12/18/20 0842     12/18/20 1800  vancomycin (VANCOREADY) IVPB 750 mg/150 mL  Status:  Discontinued        750 mg 150 mL/hr over 60 Minutes Intravenous Every 24 hours 12/17/20 1820 12/17/20 1830   12/18/20 1800  vancomycin (VANCOREADY) IVPB 750 mg/150 mL  Status:   Discontinued        750 mg 150 mL/hr over 60 Minutes Intravenous Every 24 hours 12/17/20 1830 12/18/20 0842   12/18/20 0600  ceFEPIme (MAXIPIME) 2 g in sodium chloride 0.9 % 100 mL IVPB  Status:  Discontinued        2 g 200 mL/hr over 30 Minutes Intravenous Every 12 hours 12/17/20 1544 12/17/20 1830   12/18/20 0600  ceFEPIme (MAXIPIME) 2 g in sodium chloride 0.9 % 100 mL IVPB        2 g 200 mL/hr over 30 Minutes Intravenous Every 12 hours 12/17/20 1830 12/18/20 1841   12/17/20 1700  vancomycin (VANCOREADY) IVPB 1500 mg/300 mL        1,500 mg 150 mL/hr over 120 Minutes Intravenous  Once 12/17/20 1544 12/17/20 2050   12/17/20 1600  metroNIDAZOLE (FLAGYL) IVPB 500 mg  Status:  Discontinued        500  mg 100 mL/hr over 60 Minutes Intravenous Every 12 hours 12/17/20 1524 12/18/20 0842   12/17/20 1230  cefTRIAXone (ROCEPHIN) 2 g in sodium chloride 0.9 % 100 mL IVPB  Status:  Discontinued        2 g 200 mL/hr over 30 Minutes Intravenous Every 24 hours 12/17/20 1215 12/17/20 1543        I have personally reviewed the following labs and images: CBC: Recent Labs  Lab 12/21/20 0434 12/22/20 0513 12/23/20 0305 12/24/20 0506 12/25/20 0554 12/26/20 0324 12/27/20 0615  WBC 5.3   < > 11.1* 14.8* 17.2* 14.9* 15.6*  NEUTROABS 3.5  --   --  9.1* 11.4*  --   --   HGB 8.8*   < > 9.0* 10.2* 10.1* 9.1* 8.5*  HCT 24.9*   < > 26.0* 28.4* 29.3* 26.3* 24.1*  MCV 97.6   < > 96.7 99.0 97.7 99.2 98.4  PLT 41*   < > 83* 133* 198 227 310   < > = values in this interval not displayed.   BMP &GFR Recent Labs  Lab 12/21/20 0434 12/22/20 0513 12/23/20 0305 12/24/20 0506 12/25/20 0554 12/27/20 0615  NA 141 141 141 142 141 137  K 3.6 3.5 3.0* 4.1 3.5 3.0*  CL 111 110 109 112* 112* 108  CO2 22 23 24 23 24 25   GLUCOSE 99 115* 90 94 105* 98  BUN 30* 21 17 14 14 12   CREATININE 0.57* 0.59* 0.69 0.66 0.71 0.57*  CALCIUM 7.8* 7.9* 7.6* 7.7* 7.7* 7.3*  MG 2.0  --  1.7 1.8 1.6* 1.7  PHOS 1.6*  --   --   2.8 2.8 2.9   Estimated Creatinine Clearance: 87.3 mL/min (A) (by C-G formula based on SCr of 0.57 mg/dL (L)). Liver & Pancreas: Recent Labs  Lab 12/24/20 0506 12/25/20 0554 12/27/20 0615  AST 28 23  --   ALT 78* 63*  --   ALKPHOS 61 60  --   BILITOT 0.9 1.0  --   PROT 5.2* 5.4*  --   ALBUMIN 2.1* 2.0* 1.9*   No results for input(s): LIPASE, AMYLASE in the last 168 hours. No results for input(s): AMMONIA in the last 168 hours. Diabetic: No results for input(s): HGBA1C in the last 72 hours.  Recent Labs  Lab 12/21/20 0809 12/21/20 1227  GLUCAP 118* 125*   Cardiac Enzymes: Recent Labs  Lab 12/25/20 0554  CKTOTAL 62   No results for input(s): PROBNP in the last 8760 hours. Coagulation Profile: No results for input(s): INR, PROTIME in the last 168 hours.  Thyroid Function Tests: No results for input(s): TSH, T4TOTAL, FREET4, T3FREE, THYROIDAB in the last 72 hours. Lipid Profile: No results for input(s): CHOL, HDL, LDLCALC, TRIG, CHOLHDL, LDLDIRECT in the last 72 hours.  Anemia Panel: No results for input(s): VITAMINB12, FOLATE, FERRITIN, TIBC, IRON, RETICCTPCT in the last 72 hours. Urine analysis:    Component Value Date/Time   COLORURINE AMBER (A) 12/17/2020 1840   APPEARANCEUR TURBID (A) 12/17/2020 1840   LABSPEC 1.030 12/17/2020 1840   PHURINE 5.0 12/17/2020 1840   GLUCOSEU 100 (A) 12/17/2020 1840   HGBUR LARGE (A) 12/17/2020 1840   BILIRUBINUR MODERATE (A) 12/17/2020 1840   KETONESUR TRACE (A) 12/17/2020 1840   PROTEINUR 100 (A) 12/17/2020 1840   NITRITE POSITIVE (A) 12/17/2020 1840   LEUKOCYTESUR NEGATIVE 12/17/2020 1840   Sepsis Labs: Invalid input(s): PROCALCITONIN, [place]  Microbiology: Recent Results (from the past 240 hour(s))  Urine Culture  Status: None   Collection Time: 12/17/20  3:32 PM   Specimen: In/Out Cath Urine  Result Value Ref Range Status   Specimen Description   Final    IN/OUT CATH URINE Performed at [facility], [address], [place], [place] [address]    Special Requests   Final    NONE Performed at [facility], [address] [place], [place] [address]    Culture   Final    NO GROWTH Performed at [facility], [place] [address] [place], [place] [address]    Report Status 12/19/2020 FINAL  Final  C Difficile Quick Screen w PCR reflex     Status: None   Collection Time: 12/17/20  3:32 PM   Specimen: STOOL  Result Value Ref Range Status   C Diff antigen NEGATIVE NEGATIVE Final   C Diff toxin NEGATIVE NEGATIVE Final   C Diff interpretation No C. difficile detected.  Final    Comment: Performed at [facility], [address] [place], [place] [address]  MRSA Next Gen by PCR, Nasal     Status: Abnormal   Collection Time: 12/18/20  9:55 PM   Specimen: Nasal Mucosa; Nasal Swab  Result Value Ref Range Status   MRSA by PCR Next Gen DETECTED (A) NOT DETECTED Final    Comment: RESULT CALLED TO, READ BACK BY AND VERIFIED WITH: BETH BUONO @ 2327 12/18/20 LFD (NOTE) The GeneXpert MRSA Assay (FDA approved for NASAL specimens only), is one component of a comprehensive MRSA colonization surveillance program. It is not intended to diagnose MRSA infection nor to guide or monitor treatment for MRSA infections. Test performance is not FDA approved in patients less than 43 years old. Performed at [facility], [address] [place], [place] [address]   CULTURE, BLOOD (ROUTINE X 2) w Reflex to ID Panel     Status: None   Collection Time: 12/22/20  2:14 PM   Specimen: BLOOD  Result Value Ref Range Status   Specimen Description BLOOD LEFT ANTECUBITAL  Final   Special Requests   Final    BOTTLES DRAWN AEROBIC AND ANAEROBIC Blood Culture adequate volume   Culture   Final    NO GROWTH 5 DAYS Performed at [facility], [address] [place], [place] [address]    Report Status 12/27/2020 FINAL  Final  CULTURE, BLOOD (ROUTINE X 2) w Reflex to ID  Panel     Status: None   Collection Time: 12/22/20  2:15 PM   Specimen: BLOOD  Result Value Ref Range Status   Specimen Description BLOOD RIGHT ANTECUBITAL  Final   Special Requests   Final    BOTTLES DRAWN AEROBIC AND ANAEROBIC Blood Culture adequate volume   Culture   Final    NO GROWTH 5 DAYS Performed at [facility], [address] [place], [place] [address]    Report Status 12/27/2020 FINAL  Final  Acid Fast Smear (AFB)     Status: None   Collection Time: 12/25/20  4:30 PM   Specimen: PATH Cytology Pleural fluid  Result Value Ref Range Status   AFB Specimen Processing Concentration  Final   Acid Fast Smear Negative  Final    Comment: (NOTE) Performed At: [facility] [address] [place], [place] [id number] Rush Farmer MD KG:[id number]    Source (AFB) PLEURAL  Final    Comment: Performed at [facility], [address] [place], [place] [address]  Body fluid  culture w Gram Stain     Status: None (Preliminary result)   Collection Time: 12/25/20  4:30 PM   Specimen: PATH Cytology Pleural fluid  Result Value Ref Range Status   Specimen Description   Final    PLEURAL Performed at [facility], [address] [place], [place] [address]    Special Requests   Final    NONE Performed at [facility], [address] [place], [place] [address]    Gram Stain   Final    RARE WBC PRESENT,BOTH PMN AND MONONUCLEAR NO ORGANISMS SEEN    Culture   Final    NO GROWTH 2 DAYS Performed at [facility], [place] [address] [place], [place] [address]    Report Status PENDING  Incomplete  Body fluid culture w Gram Stain     Status: None (Preliminary result)   Collection Time: 12/25/20  4:49 PM   Specimen: BILE; Body Fluid  Result Value Ref Range Status   Specimen Description   Final    BILE Performed at [facility], [address], [place], [place] [address]    Special Requests   Final    NONE Performed at  [facility], [address] [place], [place] [address]    Gram Stain   Final    RARE WBC PRESENT, PREDOMINANTLY MONONUCLEAR NO ORGANISMS SEEN    Culture   Final    NO GROWTH 2 DAYS Performed at [facility], [place] [address] [place], [place] [address]    Report Status PENDING  Incomplete    Radiology Studies: DG Chest Port 1 View  Result Date: 12/27/2020 CLINICAL DATA:  Shortness of breath with atelectasis common thoracentesis 2 days ago EXAM: PORTABLE CHEST 1 VIEW COMPARISON:  Chest radiograph 12/25/2020 FINDINGS: The left chest wall port is in stable position with the tip terminating in the region of the right atrium. The cardiomediastinal silhouette can not be assessed. The left pleural effusion has increased in size with worsening aeration of the left upper lobe. Aeration of the right lung is not significantly changed. There is no significant right effusion. There is no appreciable pneumothorax. There is no acute osseous abnormality. Enteric contrast is noted in the colon. IMPRESSION: Increased size of the large left pleural effusion with worsened aeration of the left lung. Electronically Signed   By: Valetta Mole M.D.   On: 12/27/2020 09:12      Francys Bolin T. [place]  If 7PM-7AM, please contact night-coverage [email or web address] 12/27/2020, 2:55 PM

## 2020-12-27 NOTE — Progress Notes (Signed)
[place] for heparin infusion Indication: afib and possible thromboembolic stroke  No Known Allergies  Patient Measurements: Height: 6' (182.9 cm) Weight: 70.8 kg (156 lb 1.4 oz) IBW/kg (Calculated) : 77.6  Vital Signs: Temp: 97.5 F (36.4 C) (09/18 0443) BP: 129/91 (09/18 0443) Pulse Rate: 103 (09/18 0443)  Labs: Recent Labs    12/25/20 0554 12/25/20 0554 12/25/20 1846 12/26/20 0324 12/26/20 1429 12/26/20 2242 12/27/20 0615  HGB 10.1*  --   --  9.1*  --   --  8.5*  HCT 29.3*  --   --  26.3*  --   --  24.1*  PLT 198  --   --  227  --   --  310  APTT  --    < > 30 42* 57* 64* 75*  HEPARINUNFRC  --   --  0.82* 0.54  --   --  0.25*  CREATININE 0.71  --   --   --   --   --   --   CKTOTAL 62  --   --   --   --   --   --    < > = values in this interval not displayed.     Estimated Creatinine Clearance: 87.3 mL/min (by C-G formula based on SCr of 0.71 mg/dL).   Medical History: Past Medical History:  Diagnosis Date   A-fib ([place]) 01/10/2019   pt st this was dx by Dr. Ubaldo Glassing   Cancer [facility]) 4967   LUNG   Complication of anesthesia    DIFFICULTY WAKING UP AFTER SURGERY- 20 YRS AGO   Hypertension    Lung cancer ([place])    Substance abuse ([place])     Medications:  Eliquis 5 mg BID - last dose 9/15 2017  Assessment: 70 year old M with PMH of stage IV small cell lung cancer on chemo, chemo induced pancytopenia, A. fib, chronic hep C, chronic cancer pain, HTN, alcohol abuse, malnutrition and polysubstance use presenting with nausea and vomiting and admitted with neutropenic sepsis due to acute cholecystitis and Klebsiella bacteremia. Pt with acute stroke confirmed on MRI 9/11. Pharmacy has been consulted for heparin dosing/monitoring. Anticipate initial HL to be elevated since pt last dose of Eliquis was 9/15. Pt planned for procedure on Monday (9/19).  9/16 18:46 aPTT 30, HL 0.82 - Heparin was stopped around 14:47 and patient went for  thoracentesis 9/17 0324 aPTT 42 HL 0.54 increase heparin drip rate to 1150 units/hr  9/17 1429 aPTT 57  9/17 2242 aPTT 64  Goal of Therapy:  Heparin level 0.3-0.5 units/ml - confirmed with MD to utilize stroke goals aPTT 66-85 seconds - confirmed with MD to utilize stroke goals Monitor platelets by anticoagulation protocol: Yes   Plan:  9/18 @ 0615  aPTT = 75,  HL = 0.25 Will continue pt on current rate and draw confirmation aPTT in 6 hrs on 9/18 @ 1200.  Will recheck HL on 9/19 with AM labs.   Leaf K[employer]le D, PharmD 12/27/2020 6:51 AM

## 2020-12-28 ENCOUNTER — Encounter: Payer: Self-pay | Admitting: Hematology and Oncology

## 2020-12-28 DIAGNOSIS — I484 Atypical atrial flutter: Secondary | ICD-10-CM | POA: Diagnosis not present

## 2020-12-28 DIAGNOSIS — A419 Sepsis, unspecified organism: Secondary | ICD-10-CM | POA: Diagnosis not present

## 2020-12-28 DIAGNOSIS — F1010 Alcohol abuse, uncomplicated: Secondary | ICD-10-CM | POA: Diagnosis not present

## 2020-12-28 DIAGNOSIS — D6481 Anemia due to antineoplastic chemotherapy: Secondary | ICD-10-CM | POA: Diagnosis not present

## 2020-12-28 LAB — HEPARIN LEVEL (UNFRACTIONATED)
Heparin Unfractionated: 0.16 IU/mL — ABNORMAL LOW (ref 0.30–0.70)
Heparin Unfractionated: 0.17 IU/mL — ABNORMAL LOW (ref 0.30–0.70)
Heparin Unfractionated: 0.28 IU/mL — ABNORMAL LOW (ref 0.30–0.70)

## 2020-12-28 LAB — RENAL FUNCTION PANEL
Albumin: 1.9 g/dL — ABNORMAL LOW (ref 3.5–5.0)
Anion gap: 6 (ref 5–15)
BUN: 10 mg/dL (ref 8–23)
CO2: 22 mmol/L (ref 22–32)
Calcium: 7.6 mg/dL — ABNORMAL LOW (ref 8.9–10.3)
Chloride: 108 mmol/L (ref 98–111)
Creatinine, Ser: 0.62 mg/dL (ref 0.61–1.24)
GFR, Estimated: >60 mL/min (ref >60)
Glucose, Bld: 93 mg/dL (ref 70–99)
Phosphorus: 2.9 mg/dL (ref 2.5–4.6)
Potassium: 3.8 mmol/L (ref 3.5–5.1)
Sodium: 136 mmol/L (ref 135–145)

## 2020-12-28 LAB — COMP PANEL: LEUKEMIA/LYMPHOMA

## 2020-12-28 LAB — CBC
HCT: 23.8 % — ABNORMAL LOW (ref 39.0–52.0)
Hemoglobin: 8.5 g/dL — ABNORMAL LOW (ref 13.0–17.0)
MCH: 34.8 pg — ABNORMAL HIGH (ref 26.0–34.0)
MCHC: 35.7 g/dL (ref 30.0–36.0)
MCV: 97.5 fL (ref 80.0–100.0)
Platelets: 394 10*3/uL (ref 150–400)
RBC: 2.44 MIL/uL — ABNORMAL LOW (ref 4.22–5.81)
RDW: 13.8 % (ref 11.5–15.5)
WBC: 14.5 10*3/uL — ABNORMAL HIGH (ref 4.0–10.5)
nRBC: 0 % (ref 0.0–0.2)

## 2020-12-28 LAB — MAGNESIUM: Magnesium: 1.8 mg/dL (ref 1.7–2.4)

## 2020-12-28 NOTE — Progress Notes (Addendum)
ANTICOAGULATION CONSULT NOTE  Pharmacy Consult for heparin infusion Indication: afib and possible thromboembolic stroke  No Known Allergies  Patient Measurements: Height: 6' (182.9 cm) Weight: 70.8 kg (156 lb 1.4 oz) IBW/kg (Calculated) : 77.6 HEPARIN DW (KG): 62.2   Vital Signs: Temp: 97.8 F (36.6 C) (09/19 2100) Temp Source: Oral (09/19 2100) BP: 133/87 (09/19 2100) Pulse Rate: 97 (09/19 1526)  Labs: Recent Labs    12/26/20 0324 12/26/20 1429 12/26/20 2242 12/27/20 0615 12/27/20 1158 12/27/20 2000 12/28/20 0515 12/28/20 1220 12/28/20 2250  HGB 9.1*  --   --  8.5*  --   --  8.5*  --   --   HCT 26.3*  --   --  24.1*  --   --  23.8*  --   --   PLT 227  --   --  310  --   --  394  --   --   APTT 42*   < > 64* 75* 63*  --   --   --   --   HEPARINUNFRC 0.54  --   --  0.25*  --    < > 0.16* 0.17* 0.28*  CREATININE  --   --   --  0.57*  --   --  0.62  --   --    < > = values in this interval not displayed.     Estimated Creatinine Clearance: 87.3 mL/min (by C-G formula based on SCr of 0.62 mg/dL).   Medical History: Past Medical History:  Diagnosis Date   A-fib ([place]) 01/10/2019   pt st this was dx by Dr. Ubaldo Glassing   Cancer [facility]) 0350   LUNG   Complication of anesthesia    DIFFICULTY WAKING UP AFTER SURGERY- 20 YRS AGO   Hypertension    Lung cancer ([place])    Substance abuse ([place])     Medications:  Eliquis 5 mg BID - last dose 9/15 2017  Assessment: 70 year old M with PMH of stage IV small cell lung cancer on chemo, chemo induced pancytopenia, A. fib, chronic hep C, chronic cancer pain, HTN, alcohol abuse, malnutrition and polysubstance use presenting with nausea and vomiting and admitted with neutropenic sepsis due to acute cholecystitis and Klebsiella bacteremia. Pt with acute stroke confirmed on MRI 9/11. Pharmacy has been consulted for heparin dosing/monitoring. Anticipate initial HL to be elevated since pt last dose of Eliquis was 9/15. Pt planned for procedure  on Monday (9/19).  9/16 18:46 aPTT 30, HL 0.82 - Heparin was stopped around 14:47 and patient went for thoracentesis 9/17 0324 aPTT 42 HL 0.54 increase heparin drip rate to 1150 units/hr  9/17 1429 aPTT 57  9/17 2242 aPTT 64 9/18 0615 aPTT 75 HL 0.25.  9/18 1158 aPTT 63  9/19 0515 HL 0.16, subtherapeutic  9/19 1135 HL 0.28, subtherapeutic   Goal of Therapy:  Heparin level 0.3-0.5 units/ml - confirmed with MD to utilize stroke goals Monitor platelets by anticoagulation protocol: Yes   Plan:  9/19 2250 HL 0.28, subtherapeutic Increase heparin infusion to 1900 units/hr Recheck HL with AM labs after rate change. CBC daily while on heparin  Renda Rolls, PharmD, [facility] 12/28/2020 11:32 PM

## 2020-12-28 NOTE — Progress Notes (Signed)
Physical Therapy Treatment Patient Details Name: Scott Jordan MRN: [id number] DOB: 1950-10-01 Today's Date: 12/28/2020   History of Present Illness Pt is a 70 y.o. M arriving to ED for c/o nausea, vomiting and admitted for neutropenic sepsis,severe neutropenia, cholecystitis. PMH includes stage IV small cell carcinoma, anemia, thrombocytopenia, hx of substance abuse, chronic hepatitis C, HTN., a-fib. During hospitalization has been complicated by acute CVA with residual right-sided weakness. During hospitalization multifocal acute embolic strokes occured in bilateral cerebral hemisphere & L cerebellum.    PT Comments    Pt alert, supine in bed, willing to work with PT following encouragement. Pt notes general malaise and fatigue leading to lack of motivation, but follows commands throughout therapy. Progressed treatment to upright sitting for 5 min without physical assist, pt resting elbows on knees due to nausea. Pt is able to sit upright and maintain full body weight when prompted. Pt demonstrated improvement in bed mobility requiring MIN GUARD for supine > sit w/ HOB elevated and MIN-A sit > supine for physical assist scooting to HOB. Due to significant change from PLOF, decreased strength, and overall functional mobility, SNF remains primary discharge recommendation.    Recommendations for follow up therapy are one component of a multi-disciplinary discharge planning process, led by the attending physician.  Recommendations may be updated based on patient status, additional functional criteria and insurance authorization.  Follow Up Recommendations  SNF;Supervision/Assistance - 24 hour     Equipment Recommendations  Other (comment) (TBD  next venue of care)    Recommendations for Other Services       Precautions / Restrictions Precautions Precautions: Fall Precaution Comments: High fall Restrictions Weight Bearing Restrictions: No     Mobility  Bed Mobility Overal bed mobility:  Needs Assistance Bed Mobility: Supine to Sit;Sit to Supine     Supine to sit: Min guard Sit to supine: Min assist   General bed mobility comments: Min - gaurd for cues supine > sit; min-A for scooting to [facility]    Transfers                 General transfer comment: Pt declined transfer  Ambulation/Gait                 Stairs             Wheelchair Mobility    Modified Rankin (Stroke Patients Only)       Balance Overall balance assessment: Needs assistance Sitting-balance support: Feet supported;Bilateral upper extremity supported Sitting balance-Leahy Scale: Fair Sitting balance - Comments: rests arms on legs due to malaise/fatigue; able to sit 5 minutes EOB w/ min-gaurd       Standing balance comment: deferred                            Cognition Arousal/Alertness: Awake/alert Behavior During Therapy: WFL for tasks assessed/performed Overall Cognitive Status: Within Functional Limits for tasks assessed                                        Exercises General Exercises - Lower Extremity Short Arc Quad: AROM;Both;10 reps;Supine Heel Slides: AROM;Both;10 reps;Supine Hip ABduction/ADduction: AROM;Both;10 reps;Supine    General Comments        Pertinent Vitals/Pain Pain Assessment: Faces Faces Pain Scale: Hurts little more Pain Location: General malaise Pain Intervention(s): Limited activity within patient's tolerance;Monitored during session;Repositioned  Home Living                      Prior Function            PT Goals (current goals can now be found in the care plan section) Progress towards PT goals: Progressing toward goals    Frequency    Min 2X/week      PT Plan Current plan remains appropriate    Co-evaluation              AM-PAC PT "6 Clicks" Mobility   Outcome Measure  Help needed turning from your back to your side while in a flat bed without using bedrails?: A  Little Help needed moving from lying on your back to sitting on the side of a flat bed without using bedrails?: A Little Help needed moving to and from a bed to a chair (including a wheelchair)?: A Lot Help needed standing up from a chair using your arms (e.g., wheelchair or bedside chair)?: A Lot Help needed to walk in hospital room?: Total Help needed climbing 3-5 steps with a railing? : Total 6 Click Score: 12    End of Session Equipment Utilized During Treatment: Gait belt Activity Tolerance: Patient limited by fatigue;Other (comment) (limted by malaise)     PT Visit Diagnosis: Repeated falls (R29.6);Muscle weakness (generalized) (M62.81);Difficulty in walking, not elsewhere classified (R26.2);History of falling (Z91.81)     Time: 8850-2774 PT Time Calculation (min) (ACUTE ONLY): 20 min  Charges:                       [employer], SPT

## 2020-12-28 NOTE — Progress Notes (Signed)
PROGRESS NOTE  Scott Jordan:[id number] DOB: 27-Aug-1950   PCP: Earlie Server, MD  Patient is from: Home.   DOA: 12/17/2020 LOS: 71  Chief complaints:  Chief Complaint  Patient presents with   Emesis     Brief Narrative / Interim history: 70 year old M with PMH of stage IV small cell lung cancer on chemo, chemo induced pancytopenia, A. fib not on AC, chronic hep C, chronic cancer pain, HTN, alcohol abuse, malnutrition and polysubstance use presenting with nausea and vomiting and admitted with neutropenic sepsis due to acute cholecystitis and Klebsiella bacteremia.  He had cholecystostomy on 9/11 by IR.  Started on IV ceftriaxone and IV Flagyl.  Neutropenia resolved but he now has leukocytosis.  Infectious disease consulted  Hospital course complicated by acute thromboembolic CVA with new right-sided weakness for which she was evaluated by neurology and started on aspirin.  Eventually, thrombocytopenia improved and he was transitioned to Eliquis.   Patient continues to be deconditioned physically with poor p.o. intake.  Palliative medicine consulted.  CODE STATUS changed to DNR and DNI.  Family interested in feeding tube to optimize his nutrition so he can follow-up with oncology for chemotherapy.  IR consulted.  Now patient is refusing G-tube.  Patient also have recurrence left-sided pleural effusion likely from malignancy.   Subjective: Seen and examined earlier this morning.  No major events overnight of this morning.  He says he is weak and tired wants to sleep.  He denies chest pain, shortness of breath, palpitation, GI or UTI symptoms.  He says his appetite is better and does not want a pursue G tube placement.  Objective: Vitals:   12/27/20 1555 12/27/20 2023 12/28/20 0500 12/28/20 0746  BP: 114/82 131/86 133/88 124/79  Pulse: 88 97 86 85  Resp: (!) 26 (!) 25 (!) 22 18  Temp: 97.7 F (36.5 C) 98 F (36.7 C) 98.1 F (36.7 C) 98.9 F (37.2 C)  TempSrc: Oral Oral Oral   SpO2:  98% 97% 96% 98%  Weight:      Height:        Intake/Output Summary (Last 24 hours) at 12/28/2020 1357 Last data filed at 12/28/2020 0609 Gross per 24 hour  Intake 1839.29 ml  Output 325 ml  Net 1514.29 ml   Filed Weights   12/17/20 1203 12/18/20 2130 12/24/20 1428  Weight: 61.7 kg 62.2 kg 70.8 kg    Examination:  GENERAL: Frail and chronically ill-appearing. HEENT: MMM.  Vision and hearing grossly intact.  NECK: Supple.  No apparent JVD.  RESP: 98% on RA.  No IWOB.  Diminished aeration on the left. CVS:  RRR. Heart sounds normal.  ABD/GI/GU: BS+. Abd soft, NTND.  Biliary PERC drain in place. MSK/EXT:  Moves extremities.  Significant muscle mass and subcu fat loss. SKIN: no apparent skin lesion or wound NEURO: Awake and alert. Oriented appropriately.  Left facial droop.  LUE weakness. PSYCH: Calm. Normal affect.   Procedures:  9/11-percutaneous biliary drain by IR 9/16-left-sided thoracocentesis with removal of 700 cc fluid  Microbiology summarized: 9/8-COVID-19 and influenza PCR nonreactive. 9/8-C. difficile negative. 9/8-blood culture with pansensitive Klebsiella pneumonia except to ampicillin 9/8-urine culture NGTD 9/8-MRSA PCR screen positive 9/13-repeat blood cultures NGTD 9/16-pleural fluid cultures NGTD 9/16-pleural fluid AFS/AFC pending.   Assessment & Plan: Neutropenic sepsis-neutropenia resolved. Septicemia secondary to Klebsiella pneumoniae bacteremia. Cholelithiasis with cholecystitis secondary to Klebsiella pneumoniae. -Culture data as above. -S/p percutaneous biliary drain by IR on 9/11. -TTE negative for vegetation.  -ID recommends 10 days  of ceftriaxone-completed on 9/19   Stage IV small cell lung cancer with osseous mets-s/p 1 cycle of chemo Pancytopenia related to chemotherapy-improved.  Now with leukocytosis. -Received 3 units of platelets this hospitalization. -H&H stable.  Leukopenia and thrombocytopenia resolved.  Now with leukocytosis    Acute bilateral CVA-reportedly had right-sided hemiparesis although he seems to have left facial droop and left hemiparesis on my exam.  Felt to be thromboembolic in the setting of his A. Fib.   -Cardiology, neurology and ID did not feel TEE is necessary.  -Started on Eliquis on 9/15>> IV heparin on 9/16 as a bridge for G-tube placement -Continue IV heparin in case pulmonology considers Pleurx cath for his recurrent pleural effusion.  Paroxysmal A. fib with RVR-on Cardizem and Eliquis at home.  RVR resolved. -Continue p.o. Cardizem CD 240 mg daily -P.o. Cardizem 30 mg every 6 hours as needed sustained HR greater than 120 -Anticoagulation as above  Recurrent left pleural effusion/left atelectasis: Likely malignant.  S/p left thoracocentesis with removal of 700 cc on 9/16.  Fluid culture NGTD.  AFB smear negative.  Repeat CXR with left white out.  Saturating in upper 90s on RA. -Defer to pulmonology-Pleurx cath? -Follow fluid cytology -Incentive spirometry, OOB, PT/OT   AKI/azotemia: Resolved. Recent Labs    12/18/20 0500 12/19/20 0457 12/20/20 0500 12/21/20 0434 12/22/20 0513 12/23/20 0305 12/24/20 0506 12/25/20 0554 12/27/20 0615 12/28/20 0515  BUN 48* 43* 36* 30* 21 17 14 14 12 10   CREATININE 1.46* 0.94 0.72 0.57* 0.59* 0.69 0.66 0.71 0.57* 0.62  -Monitor intermittently  Hypokalemia/hypomagnesemia: Resolved.  Hyponatremia: Resolved.   Alcohol use disorder: No withdrawal signs or symptoms.  Physical deconditioning/debility -Incentive spirometry, OOB/PT/OT  Urinary tension?  Foley discontinued on 9/17.  Resolved.  Goals of care-patient with stage IV small cell lung cancer, acute thromboembolic CVA and other comorbidity as above.  Very poor long-term prognosis.  Initially interested NG tube, now refusing. -Appreciate help by palliative care.   Dysphagia-SLP recommended dysphagia 3 diet. Severe protein calorie malnutrition/anorexia-significant muscle mass loss.  P.o.  intake remains poor despite appetite stimulants and supplements.  Likely due to malignancy, chemo and acute illness.  Initially wanted G-tube.  IR consulted.  Now refusing G-tube saying his appetite has improved -Calorie count Body mass index is 21.17 kg/m. Nutrition Problem: Severe Malnutrition Etiology: cancer and cancer related treatments Signs/Symptoms: severe fat depletion, severe muscle depletion Interventions: Prostat, MVI, Ensure Enlive (each supplement provides 350kcal and 20 grams of protein)   Pressure skin injury: Pressure Injury 12/18/20 Heel Right Deep Tissue Pressure Injury - Purple or maroon localized area of discolored intact skin or blood-filled blister due to damage of underlying soft tissue from pressure and/or shear. (Active)  12/18/20 2200  Location: Heel  Location Orientation: Right  Staging: Deep Tissue Pressure Injury - Purple or maroon localized area of discolored intact skin or blood-filled blister due to damage of underlying soft tissue from pressure and/or shear.  Wound Description (Comments):   Present on Admission:      Pressure Injury 12/18/20 Heel Left Deep Tissue Pressure Injury - Purple or maroon localized area of discolored intact skin or blood-filled blister due to damage of underlying soft tissue from pressure and/or shear. (Active)  12/18/20 2200  Location: Heel  Location Orientation: Left  Staging: Deep Tissue Pressure Injury - Purple or maroon localized area of discolored intact skin or blood-filled blister due to damage of underlying soft tissue from pressure and/or shear.  Wound Description (Comments):   Present on  Admission:    DVT prophylaxis:  Place and maintain sequential compression device Start: 12/18/20 1303 Place TED hose Start: 12/17/20 1523  Code Status: Full code Family Communication: Patient and/or RN.  Updated patient's niece over the phon Level of care: Med-Surg Status is: Inpatient  Remains inpatient appropriate  because:Ongoing diagnostic testing needed not appropriate for outpatient work up, Unsafe d/c plan, and Inpatient level of care appropriate due to severity of illness  Dispo: The patient is from: Home              Anticipated d/c is to: SNF              Patient currently is not medically stable to d/c.   Difficult to place patient No       Consultants:  Neurology-signed off Cardiology-signed off Oncology Palliative medicine Interventional radiology Pulmonology   Sch Meds:  Scheduled Meds:  sodium chloride   Intravenous Once   albuterol  2.5 mg Nebulization BID   chlorhexidine  10 mL Mouth/Throat BID   Chlorhexidine Gluconate Cloth  6 each Topical Daily   diltiazem  240 mg Oral Daily   dronabinol  2.5 mg Oral BID AC   folic acid  1 mg Oral Daily   furosemide  20 mg Oral Daily   guaiFENesin  1,200 mg Oral BID   multivitamin with minerals  1 tablet Oral Daily   sodium chloride flush  10-40 mL Intracatheter Q12H   sodium chloride flush  5 mL Intracatheter Q8H   thiamine  100 mg Oral Daily   Continuous Infusions:  sodium chloride Stopped (12/25/20 1558)   sodium chloride 10 mL/hr (12/20/20 1220)   cefTRIAXone (ROCEPHIN)  IV 2 g (12/28/20 0549)   heparin 1,700 Units/hr (12/28/20 0606)   PRN Meds:.sodium chloride, sodium chloride, acetaminophen **OR** acetaminophen, diltiazem, ondansetron **OR** ondansetron (ZOFRAN) IV, oxyCODONE-acetaminophen, sodium chloride flush  Antimicrobials: Anti-infectives (From admission, onward)    Start     Dose/Rate Route Frequency Ordered Stop   12/21/20 1200  metroNIDAZOLE (FLAGYL) IVPB 500 mg  Status:  Discontinued        500 mg 100 mL/hr over 60 Minutes Intravenous Every 12 hours 12/21/20 1023 12/26/20 1450   12/19/20 0600  cefTRIAXone (ROCEPHIN) 2 g in sodium chloride 0.9 % 100 mL IVPB        2 g 200 mL/hr over 30 Minutes Intravenous Every 24 hours 12/18/20 0842     12/18/20 1800  vancomycin (VANCOREADY) IVPB 750 mg/150 mL  Status:   Discontinued        750 mg 150 mL/hr over 60 Minutes Intravenous Every 24 hours 12/17/20 1820 12/17/20 1830   12/18/20 1800  vancomycin (VANCOREADY) IVPB 750 mg/150 mL  Status:  Discontinued        750 mg 150 mL/hr over 60 Minutes Intravenous Every 24 hours 12/17/20 1830 12/18/20 0842   12/18/20 0600  ceFEPIme (MAXIPIME) 2 g in sodium chloride 0.9 % 100 mL IVPB  Status:  Discontinued        2 g 200 mL/hr over 30 Minutes Intravenous Every 12 hours 12/17/20 1544 12/17/20 1830   12/18/20 0600  ceFEPIme (MAXIPIME) 2 g in sodium chloride 0.9 % 100 mL IVPB        2 g 200 mL/hr over 30 Minutes Intravenous Every 12 hours 12/17/20 1830 12/18/20 1841   12/17/20 1700  vancomycin (VANCOREADY) IVPB 1500 mg/300 mL        1,500 mg 150 mL/hr over 120 Minutes Intravenous  Once  12/17/20 1544 12/17/20 2050   12/17/20 1600  metroNIDAZOLE (FLAGYL) IVPB 500 mg  Status:  Discontinued        500 mg 100 mL/hr over 60 Minutes Intravenous Every 12 hours 12/17/20 1524 12/18/20 0842   12/17/20 1230  cefTRIAXone (ROCEPHIN) 2 g in sodium chloride 0.9 % 100 mL IVPB  Status:  Discontinued        2 g 200 mL/hr over 30 Minutes Intravenous Every 24 hours 12/17/20 1215 12/17/20 1543        I have personally reviewed the following labs and images: CBC: Recent Labs  Lab 12/24/20 0506 12/25/20 0554 12/26/20 0324 12/27/20 0615 12/28/20 0515  WBC 14.8* 17.2* 14.9* 15.6* 14.5*  NEUTROABS 9.1* 11.4*  --   --   --   HGB 10.2* 10.1* 9.1* 8.5* 8.5*  HCT 28.4* 29.3* 26.3* 24.1* 23.8*  MCV 99.0 97.7 99.2 98.4 97.5  PLT 133* 198 227 310 394   BMP &GFR Recent Labs  Lab 12/23/20 0305 12/24/20 0506 12/25/20 0554 12/27/20 0615 12/28/20 0515  NA 141 142 141 137 136  K 3.0* 4.1 3.5 3.0* 3.8  CL 109 112* 112* 108 108  CO2 24 23 24 25 22   GLUCOSE 90 94 105* 98 93  BUN 17 14 14 12 10   CREATININE 0.69 0.66 0.71 0.57* 0.62  CALCIUM 7.6* 7.7* 7.7* 7.3* 7.6*  MG 1.7 1.8 1.6* 1.7 1.8  PHOS  --  2.8 2.8 2.9 2.9    Estimated Creatinine Clearance: 87.3 mL/min (by C-G formula based on SCr of 0.62 mg/dL). Liver & Pancreas: Recent Labs  Lab 12/24/20 0506 12/25/20 0554 12/27/20 0615 12/28/20 0515  AST 28 23  --   --   ALT 78* 63*  --   --   ALKPHOS 61 60  --   --   BILITOT 0.9 1.0  --   --   PROT 5.2* 5.4*  --   --   ALBUMIN 2.1* 2.0* 1.9* 1.9*   No results for input(s): LIPASE, AMYLASE in the last 168 hours. No results for input(s): AMMONIA in the last 168 hours. Diabetic: No results for input(s): HGBA1C in the last 72 hours.  No results for input(s): GLUCAP in the last 168 hours.  Cardiac Enzymes: Recent Labs  Lab 12/25/20 0554  CKTOTAL 62   No results for input(s): PROBNP in the last 8760 hours. Coagulation Profile: No results for input(s): INR, PROTIME in the last 168 hours.  Thyroid Function Tests: No results for input(s): TSH, T4TOTAL, FREET4, T3FREE, THYROIDAB in the last 72 hours. Lipid Profile: No results for input(s): CHOL, HDL, LDLCALC, TRIG, CHOLHDL, LDLDIRECT in the last 72 hours.  Anemia Panel: No results for input(s): VITAMINB12, FOLATE, FERRITIN, TIBC, IRON, RETICCTPCT in the last 72 hours. Urine analysis:    Component Value Date/Time   COLORURINE AMBER (A) 12/17/2020 1840   APPEARANCEUR TURBID (A) 12/17/2020 1840   LABSPEC 1.030 12/17/2020 1840   PHURINE 5.0 12/17/2020 1840   GLUCOSEU 100 (A) 12/17/2020 1840   HGBUR LARGE (A) 12/17/2020 1840   BILIRUBINUR MODERATE (A) 12/17/2020 1840   KETONESUR TRACE (A) 12/17/2020 1840   PROTEINUR 100 (A) 12/17/2020 1840   NITRITE POSITIVE (A) 12/17/2020 1840   LEUKOCYTESUR NEGATIVE 12/17/2020 1840   Sepsis Labs: Invalid input(s): PROCALCITONIN, [facility]  Microbiology: Recent Results (from the past 240 hour(s))  MRSA Next Gen by PCR, Nasal     Status: Abnormal   Collection Time: 12/18/20  9:55 PM   Specimen: Nasal Mucosa;  Nasal Swab  Result Value Ref Range Status   MRSA by PCR Next Gen DETECTED (A) NOT DETECTED  Final    Comment: RESULT CALLED TO, READ BACK BY AND VERIFIED WITH: BETH BUONO @ 2327 12/18/20 LFD (NOTE) The GeneXpert MRSA Assay (FDA approved for NASAL specimens only), is one component of a comprehensive MRSA colonization surveillance program. It is not intended to diagnose MRSA infection nor to guide or monitor treatment for MRSA infections. Test performance is not FDA approved in patients less than 60 years old. Performed at [facility], [address] [place], [place] [address]   CULTURE, BLOOD (ROUTINE X 2) w Reflex to ID Panel     Status: None   Collection Time: 12/22/20  2:14 PM   Specimen: BLOOD  Result Value Ref Range Status   Specimen Description BLOOD LEFT ANTECUBITAL  Final   Special Requests   Final    BOTTLES DRAWN AEROBIC AND ANAEROBIC Blood Culture adequate volume   Culture   Final    NO GROWTH 5 DAYS Performed at [facility], [address] [place], [place] [address]    Report Status 12/27/2020 FINAL  Final  CULTURE, BLOOD (ROUTINE X 2) w Reflex to ID Panel     Status: None   Collection Time: 12/22/20  2:15 PM   Specimen: BLOOD  Result Value Ref Range Status   Specimen Description BLOOD RIGHT ANTECUBITAL  Final   Special Requests   Final    BOTTLES DRAWN AEROBIC AND ANAEROBIC Blood Culture adequate volume   Culture   Final    NO GROWTH 5 DAYS Performed at [facility], [address] [place], [place] [address]    Report Status 12/27/2020 FINAL  Final  Acid Fast Smear (AFB)     Status: None   Collection Time: 12/25/20  4:30 PM   Specimen: PATH Cytology Pleural fluid  Result Value Ref Range Status   AFB Specimen Processing Concentration  Final   Acid Fast Smear Negative  Final    Comment: (NOTE) Performed At: [facility] [place], [place] [id number] Rush Farmer MD EX:[id number]    Source (AFB) PLEURAL  Final    Comment: Performed at [facility], [address] [place],  [place] [address]  Body fluid culture w Gram Stain     Status: None (Preliminary result)   Collection Time: 12/25/20  4:30 PM   Specimen: PATH Cytology Pleural fluid  Result Value Ref Range Status   Specimen Description   Final    PLEURAL Performed at [facility], [address] [place], [place] [address]    Special Requests   Final    NONE Performed at [facility], [address] [place], [place] [address]    Gram Stain   Final    RARE WBC PRESENT,BOTH PMN AND MONONUCLEAR NO ORGANISMS SEEN    Culture   Final    NO GROWTH 3 DAYS Performed at [facility], [place] [address], [place], [place] [address]    Report Status PENDING  Incomplete  Body fluid culture w Gram Stain     Status: None (Preliminary result)   Collection Time: 12/25/20  4:49 PM   Specimen: BILE; Body Fluid  Result Value Ref Range Status   Specimen Description   Final    BILE Performed at [facility], [address], [place], [place] [address]    Special Requests   Final    NONE Performed  at [facility], [address] [place], [place] [address]    Gram Stain   Final    RARE WBC PRESENT, PREDOMINANTLY MONONUCLEAR NO ORGANISMS SEEN    Culture   Final    NO GROWTH 3 DAYS Performed at [place] [address], [place],  [address]    Report Status PENDING  Incomplete    Radiology Studies: DG Chest 2 View  Result Date: 12/27/2020 CLINICAL DATA:  Status post thoracentesis 2 days prior. EXAM: CHEST - 2 VIEW COMPARISON:  Radiograph same day (12/27/2020 at 830 hours FINDINGS: Persistent near complete opacification of the LEFT hemithorax. Small amount of aerated lung remains at LEFT lung apex. Port in place. RIGHT lung clear. IMPRESSION: No interval change in near complete opacification LEFT hemithorax. Electronically Signed   By: Suzy Bouchard M.D.   On: 12/27/2020 18:17      Taitum Menton T. [place]  If 7PM-7AM, please contact  night-coverage [email or web address] 12/28/2020, 1:57 PM

## 2020-12-28 NOTE — Progress Notes (Signed)
OT Cancellation Note  Patient Details Name: Scott Jordan MRN: [id number] DOB: [birth year]   Cancelled Treatment:    Reason Eval/Treat Not Completed: Patient unavailable. Pt is engaged in PT session when OTR/L entered room. Given pt's considerable fatigue, he is unable to complete two rehabilitation sessions back-to-back. Will attempt OT at a later date, as pt is available, willing, and medically appropriate.   Josiah Lobo, PhD, MS, OTR/L 12/28/20, 4:04 PM

## 2020-12-28 NOTE — Progress Notes (Signed)
Patient refused Metaneb treatment.

## 2020-12-28 NOTE — Progress Notes (Signed)
Scott Jordan for heparin infusion Indication: afib and possible thromboembolic stroke  No Known Allergies  Patient Measurements: Height: 6' (182.9 cm) Weight: 70.8 kg (156 lb 1.4 oz) IBW/kg (Calculated) : 77.6 HEPARIN DW (KG): 62.2   Vital Signs: Temp: 98.1 F (36.7 C) (09/19 0500) Temp Source: Oral (09/19 0500) BP: 133/88 (09/19 0500) Pulse Rate: 86 (09/19 0500)  Labs: Recent Labs    12/26/20 0324 12/26/20 1429 12/26/20 2242 12/27/20 0615 12/27/20 1158 12/27/20 2000 12/28/20 0515  HGB 9.1*  --   --  8.5*  --   --  8.5*  HCT 26.3*  --   --  24.1*  --   --  23.8*  PLT 227  --   --  310  --   --  394  APTT 42*   < > 64* 75* 63*  --   --   HEPARINUNFRC 0.54  --   --  0.25*  --  0.19* 0.16*  CREATININE  --   --   --  0.57*  --   --  0.62   < > = values in this interval not displayed.     Estimated Creatinine Clearance: 87.3 mL/min (by C-G formula based on SCr of 0.62 mg/dL).   Medical History: Past Medical History:  Diagnosis Date   A-fib ([place]) 01/10/2019   pt st this was dx by Dr. Ubaldo Glassing   Cancer [facility]) [id number]   LUNG   Complication of anesthesia    DIFFICULTY WAKING UP AFTER SURGERY- 20 YRS AGO   Hypertension    Lung cancer ([place])    Substance abuse ([place])     Medications:  Eliquis 5 mg BID - last dose 9/15 2017  Assessment: 70 year old M with PMH of stage IV small cell lung cancer on chemo, chemo induced pancytopenia, A. fib, chronic hep C, chronic cancer pain, HTN, alcohol abuse, malnutrition and polysubstance use presenting with nausea and vomiting and admitted with neutropenic sepsis due to acute cholecystitis and Klebsiella bacteremia. Pt with acute stroke confirmed on MRI 9/11. Pharmacy has been consulted for heparin dosing/monitoring. Anticipate initial HL to be elevated since pt last dose of Eliquis was 9/15. Pt planned for procedure on Monday (9/19).  9/16 18:46 aPTT 30, HL 0.82 - Heparin was stopped around 14:47 and  patient went for thoracentesis 9/17 0324 aPTT 42 HL 0.54 increase heparin drip rate to 1150 units/hr  9/17 1429 aPTT 57  9/17 2242 aPTT 64 9/18 0615 aPTT 75 HL 0.25.  9/18 1158 aPTT 63  9/19 0515 HL 0.16, subtherapeutic   This evening, heparin level is subtherapeutic at 0.19 despite infusion rate increase earlier this afternoon. Hgb and platelets low but stable this morning. No issues with the infusion or overt bleeding noted per RN.  Goal of Therapy:  Heparin level 0.3-0.5 units/ml - confirmed with MD to utilize stroke goals Monitor platelets by anticoagulation protocol: Yes   Plan:  9/19:   HL @ 0515 = 0.16 Will increase drip rate to 1700 units/hr and recheck HL 6 hrs after rate change.   Shari Natt D Clinical Pharmacist  12/28/2020   5:57 AM

## 2020-12-28 NOTE — Progress Notes (Signed)
ANTICOAGULATION CONSULT NOTE  Pharmacy Consult for heparin infusion Indication: afib and possible thromboembolic stroke  No Known Allergies  Patient Measurements: Height: 6' (182.9 cm) Weight: 70.8 kg (156 lb 1.4 oz) IBW/kg (Calculated) : 77.6 HEPARIN DW (KG): 62.2   Vital Signs: Temp: 98.9 F (37.2 C) (09/19 0746) Temp Source: Oral (09/19 0500) BP: 124/79 (09/19 0746) Pulse Rate: 85 (09/19 0746)  Labs: Recent Labs    12/26/20 0324 12/26/20 1429 12/26/20 2242 12/27/20 0615 12/27/20 1158 12/27/20 2000 12/28/20 0515 12/28/20 1220  HGB 9.1*  --   --  8.5*  --   --  8.5*  --   HCT 26.3*  --   --  24.1*  --   --  23.8*  --   PLT 227  --   --  310  --   --  394  --   APTT 42*   < > 64* 75* 63*  --   --   --   HEPARINUNFRC 0.54  --   --  0.25*  --  0.19* 0.16* 0.17*  CREATININE  --   --   --  0.57*  --   --  0.62  --    < > = values in this interval not displayed.     Estimated Creatinine Clearance: 87.3 mL/min (by C-G formula based on SCr of 0.62 mg/dL).   Medical History: Past Medical History:  Diagnosis Date   A-fib ([place]) 01/10/2019   pt st this was dx by Dr. Ubaldo Glassing   Cancer [facility]) 0630   LUNG   Complication of anesthesia    DIFFICULTY WAKING UP AFTER SURGERY- 20 YRS AGO   Hypertension    Lung cancer ([place])    Substance abuse ([place])     Medications:  Eliquis 5 mg BID - last dose 9/15 2017  Assessment: 70 year old M with PMH of stage IV small cell lung cancer on chemo, chemo induced pancytopenia, A. fib, chronic hep C, chronic cancer pain, HTN, alcohol abuse, malnutrition and polysubstance use presenting with nausea and vomiting and admitted with neutropenic sepsis due to acute cholecystitis and Klebsiella bacteremia. Pt with acute stroke confirmed on MRI 9/11. Pharmacy has been consulted for heparin dosing/monitoring. Anticipate initial HL to be elevated since pt last dose of Eliquis was 9/15. Pt planned for procedure on Monday (9/19).  9/16 18:46 aPTT 30, HL  0.82 - Heparin was stopped around 14:47 and patient went for thoracentesis 9/17 0324 aPTT 42 HL 0.54 increase heparin drip rate to 1150 units/hr  9/17 1429 aPTT 57  9/17 2242 aPTT 64 9/18 0615 aPTT 75 HL 0.25.  9/18 1158 aPTT 63  9/19 0515 HL 0.16, subtherapeutic    Goal of Therapy:  Heparin level 0.3-0.5 units/ml - confirmed with MD to utilize stroke goals Monitor platelets by anticoagulation protocol: Yes   Plan:  9/19 1220 HL 0.17, subtherapeutic Increase heparin infusion to 1800 units/hr Recheck HL at 2100 CBC daily while on heparin  Tawnya Crook, PharmD, BCPS Clinical Pharmacist 12/28/2020 1:55 PM

## 2020-12-28 NOTE — Plan of Care (Signed)
  Problem: Activity: Goal: Risk for activity intolerance will decrease Outcome: Not Progressing   Problem: Nutrition: Goal: Adequate nutrition will be maintained Outcome: Not Progressing

## 2020-12-28 NOTE — Progress Notes (Signed)
Date of Admission:  12/17/2020     ID: Scott Jordan is a 70 y.o. male  Principal Problem:   Neutropenic sepsis ([place]) Active Problems:   Protein-calorie malnutrition, severe   Small cell lung cancer ([place])   Bone metastasis (H[facility])   Chemotherapy-induced neutropenia ([facility])   Antineoplastic chemotherapy induced anemia   Thrombocytopenia (H[facility])   Atypical atrial flutter (H[facility])   Chronic anticoagulation   Alcohol abuse   History of substance abuse ([place])   Hypertension, essential   B12 deficiency   Septicemia due to Klebsiella pneumoniae ([place])   Cholecystitis   Cholelithiasis with acute cholecystitis   Cerebrovascular accident (CVA) due to embolism of precerebral artery [facility])   Palliative care encounter  Scott Jordan is a 70 y.o. male with a history of small cell lung cancer with recurrent left sided pleural effusion , hepc Afib, He presented to ED on 9/8 by EMS with general weakness,  vomiting blood, unable to eat.  He had recently chemotherapy on 12/08/2020 with carboplatin.  Subjective:  No specific complaints Weak Poor appetitie Medications:   sodium chloride   Intravenous Once   albuterol  2.5 mg Nebulization BID   chlorhexidine  10 mL Mouth/Throat BID   Chlorhexidine Gluconate Cloth  6 each Topical Daily   diltiazem  240 mg Oral Daily   dronabinol  2.5 mg Oral BID AC   folic acid  1 mg Oral Daily   furosemide  20 mg Oral Daily   guaiFENesin  1,200 mg Oral BID   multivitamin with minerals  1 tablet Oral Daily   sodium chloride flush  10-40 mL Intracatheter Q12H   sodium chloride flush  5 mL Intracatheter Q8H   thiamine  100 mg Oral Daily    Objective: Awake, alert, tired, ill Port Patient Vitals for the past 24 hrs:  BP Temp Temp src Pulse Resp SpO2  12/29/20 1919 120/84 97.7 F (36.5 C) -- -- (!) 25 --  12/29/20 1905 117/82 97.8 F (36.6 C) Oral (!) 135 (!) 24 100 %  12/29/20 1558 128/88 97.9 F (36.6 C) Oral (!) 102 20 98 %  12/29/20 0809 (!) 122/93 97.8 F  (36.6 C) Oral (!) 107 20 97 %  12/29/20 0500 118/79 97.6 F (36.4 C) Oral 81 -- 98 %  12/28/20 2100 133/87 97.8 F (36.6 C) Oral -- 18 98 %    On nasal oxygen Lungs: b/l air entry decreased left base. Heart: irregular Abdomen: Soft, non-tender,not distended. Bowel sounds normal. No masses Extremities: atraumatic, no cyanosis. No edema. No clubbing Skin: No rashes or lesions. Or bruising Lymph: Cervical, supraclavicular normal. Neurologic: Grossly non-focal  Lab Results CBC Latest Ref Rng & Units 12/29/2020 12/28/2020 12/27/2020  WBC 4.0 - 10.5 K/uL 14.7(H) 14.5(H) 15.6(H)  Hemoglobin 13.0 - 17.0 g/dL 8.4(L) 8.5(L) 8.5(L)  Hematocrit 39.0 - 52.0 % 24.0(L) 23.8(L) 24.1(L)  Platelets 150 - 400 K/uL 508(H) 394 310     CMP Latest Ref Rng & Units 12/28/2020 12/27/2020 12/25/2020  Glucose 70 - 99 mg/dL 93 98 105(H)  BUN 8 - 23 mg/dL 10 12 14   Creatinine 0.61 - 1.24 mg/dL 0.62 0.57(L) 0.71  Sodium 135 - 145 mmol/L 136 137 141  Potassium 3.5 - 5.1 mmol/L 3.8 3.0(L) 3.5  Chloride 98 - 111 mmol/L 108 108 112(H)  CO2 22 - 32 mmol/L 22 25 24   Calcium 8.9 - 10.3 mg/dL 7.6(L) 7.3(L) 7.7(L)  Total Protein 6.5 - 8.1 g/dL - - 5.4(L)  Total Bilirubin  0.3 - 1.2 mg/dL - - 1.0  Alkaline Phos 38 - 126 U/L - - 60  AST 15 - 41 U/L - - 23  ALT 0 - 44 U/L - - 63(H)     Sedimentation Rate No results for input(s): ESRSEDRATE in the last 72 hours. C-Reactive Protein No results for input(s): CRP in the last 72 hours.  Microbiology: 12/17/20 BC- klebsiella pneumoniae 12/22/20 BC- NG 12/25/20 Bil nG 12/25/20 pleural fluid NG  Studies/Results: DG Chest 2 View  Result Date: 12/27/2020 CLINICAL DATA:  Status post thoracentesis 2 days prior. EXAM: CHEST - 2 VIEW COMPARISON:  Radiograph same day (12/27/2020 at 830 hours FINDINGS: Persistent near complete opacification of the LEFT hemithorax. Small amount of aerated lung remains at LEFT lung apex. Port in place. RIGHT lung clear. IMPRESSION: No interval change  in near complete opacification LEFT hemithorax. Electronically Signed   By: Suzy Bouchard M.D.   On: 12/27/2020 18:17   DG Chest Port 1 View  Result Date: 12/27/2020 CLINICAL DATA:  Shortness of breath with atelectasis common thoracentesis 2 days ago EXAM: PORTABLE CHEST 1 VIEW COMPARISON:  Chest radiograph 12/25/2020 FINDINGS: The left chest wall port is in stable position with the tip terminating in the region of the right atrium. The cardiomediastinal silhouette can not be assessed. The left pleural effusion has increased in size with worsening aeration of the left upper lobe. Aeration of the right lung is not significantly changed. There is no significant right effusion. There is no appreciable pneumothorax. There is no acute osseous abnormality. Enteric contrast is noted in the colon. IMPRESSION: Increased size of the large left pleural effusion with worsened aeration of the left lung. Electronically Signed   By: Valetta Mole M.D.   On: 12/27/2020 09:12     Assessment/Plan: Klebsiella bacteremia with sepsis secondary to neutropenia. Recent chemotherapy causing neutropenia. -The source of the Klebsiella could be the gallbladder as he had acute cholecystitis.  Underwent cholecystostomy.  No culture was sent.    Repeat blood culture neg- Do not suspect port infection . Patient completed 10 days of ceftriaxone  Resolution of neutropenia  Left  pleural effusion/ S/p thoracentesis  predominantly  lymphocytic /monocytic effusion, culture neg-  In 2021 it was malignant effusion and that is how he was diagnosed with lung cancer  Metastatic lung carcinoma. On chemo Thrombocytopenia and neutropenia due to chemo has resolved Multiple acute infarcts from emboli- likely ventricular in origin because of afib and not being on eliquis    poor prognosis- palliative on board ID will sign off - call if needed

## 2020-12-28 NOTE — Progress Notes (Signed)
Pulmonary Medicine          Date: 12/28/2020,   MRN# [id number] Scott Jordan 01-28-1951     AdmissionWeight: 61.7 kg                 CurrentWeight: 70.8 kg  Referring physician: Dr Cyndia Skeeters    CHIEF COMPLAINT:   Acute on chronic hypoxemic respiratory failure   HISTORY OF PRESENT ILLNESS   This is a pleasant male with hx of lung cancer, stage 4 small cell ca, substance abuse, chronic HCV, bony metastasis who is noted to have abnormal chest imaging with complete white out of left lung.    He appears weak and is severely dyspenic during my evaluation bu able to speak slowly.  PCCM consultation for evaluation and management. Pictorial documentation of tis thoracic imaging is included below showing large pleural effusion with associated complete left atelcetasis.   12/27/20- patient feels improved, s/p throacentesis 700 cc removed,  repeat CXR this am with improvement on left.   Atelectasis is still present and he need to have aggressive BPH, will order metaneb with albuterol today to be done with RT twice daily   12/28/20- patient was seen and evaluated at bedside with family present we discussed potential tunneled pleural catheter and patient is imrproved.    PAST MEDICAL HISTORY   Past Medical History:  Diagnosis Date  . A-fib ([place]) 01/10/2019   pt st this was dx by Dr. Ubaldo Glassing  . Cancer ([place]) 2021   LUNG  . Complication of anesthesia    DIFFICULTY WAKING UP AFTER SURGERY- 20 YRS AGO  . Hypertension   . Lung cancer ([place])   . Substance abuse ([place])      SURGICAL HISTORY   Past Surgical History:  Procedure Laterality Date  . ARM DEBRIDEMENT Left    INCISION AND DEBRIDEMENT LOWER ARM -20 YRS AGO  . BACK SURGERY    . IR PERC CHOLECYSTOSTOMY  12/20/2020  . PORTACATH PLACEMENT Left 08/29/2019   Procedure: INSERTION PORT-A-CATH;  Surgeon: Nestor Lewandowsky, MD;  Location: [facility] ORS;  Service: General;  Laterality: Left;     FAMILY HISTORY   History reviewed. No pertinent  family history.   SOCIAL HISTORY   Social History   Tobacco Use  . Smoking status: Former    Packs/day: 0.50    Types: Cigarettes  . Smokeless tobacco: Never  . Tobacco comments:    not smoked since 6 weeks ago  Vaping Use  . Vaping Use: Never used  Substance Use Topics  . Alcohol use: Yes    Comment: 84 OZ IN WEEK  . Drug use: Not Currently    Types: Heroin    Comment: heroin WITHIN PAST YEAR     MEDICATIONS    Home Medication:    Current Medication:  Current Facility-Administered Medications:  .  0.9 %  sodium chloride infusion (Manually program via Guardrails IV Fluids), , Intravenous, Once, Sharen Hones, MD .  0.9 %  sodium chloride infusion, , Intravenous, PRN, Sharen Hones, MD, Stopped at 12/25/20 1558 .  0.9 %  sodium chloride infusion, , , Continuous PRN, Criselda Peaches, MD, Last Rate: 10 mL/hr at 12/20/20 1220, 10 mL/hr at 12/20/20 1220 .  acetaminophen (TYLENOL) tablet 650 mg, 650 mg, Oral, Q6H PRN **OR** acetaminophen (TYLENOL) suppository 650 mg, 650 mg, Rectal, Q6H PRN, Cox, Amy N, DO .  albuterol (PROVENTIL) (2.5 MG/3ML) 0.083% nebulizer solution 2.5 mg, 2.5 mg, Nebulization, BID, Ottie Glazier, MD .  chlorhexidine (PERIDEX) 0.12 % solution 10 mL, 10 mL, Mouth/Throat, BID, Earlie Server, MD, 10 mL at 12/24/20 2017 .  Chlorhexidine Gluconate Cloth 2 % PADS 6 each, 6 each, Topical, Daily, Cox, Amy N, DO, 6 each at 12/28/20 1502 .  diltiazem (CARDIZEM CD) 24 hr capsule 240 mg, 240 mg, Oral, Daily, Cox, Amy N, DO, 240 mg at 12/28/20 1500 .  diltiazem (CARDIZEM) tablet 30 mg, 30 mg, Oral, Q6H PRN, Cyndia Skeeters, Taye T, MD .  dronabinol (MARINOL) capsule 2.5 mg, 2.5 mg, Oral, BID AC, Sharen Hones, MD, 2.5 mg at 12/24/20 1655 .  folic acid (FOLVITE) tablet 1 mg, 1 mg, Oral, Daily, Sharen Hones, MD, 1 mg at 12/28/20 1501 .  furosemide (LASIX) tablet 20 mg, 20 mg, Oral, Daily, Gonfa, Taye T, MD, 20 mg at 12/28/20 1502 .  guaiFENesin (MUCINEX) 12 hr tablet 1,200 mg, 1,200  mg, Oral, BID, Dorothe Pea, RPH, 1,200 mg at 12/28/20 1500 .  heparin ADULT infusion 100 units/mL (25000 units/253mL), 1,800 Units/hr, Intravenous, Continuous, Gonfa, Taye T, MD, Last Rate: 18 mL/hr at 12/28/20 1459, 1,800 Units/hr at 12/28/20 1459 .  multivitamin with minerals tablet 1 tablet, 1 tablet, Oral, Daily, Sharen Hones, MD, 1 tablet at 12/28/20 1502 .  ondansetron (ZOFRAN) tablet 4 mg, 4 mg, Oral, Q6H PRN **OR** ondansetron (ZOFRAN) injection 4 mg, 4 mg, Intravenous, Q6H PRN, Cox, Amy N, DO .  oxyCODONE-acetaminophen (PERCOCET/ROXICET) 5-325 MG per tablet 1 tablet, 1 tablet, Oral, Q6H PRN, Mansy, Jan A, MD, 1 tablet at 12/24/20 0944 .  sodium chloride flush (NS) 0.9 % injection 10-40 mL, 10-40 mL, Intracatheter, Q12H, Manuella Ghazi, Vipul, MD, 10 mL at 12/28/20 1503 .  sodium chloride flush (NS) 0.9 % injection 10-40 mL, 10-40 mL, Intracatheter, PRN, Manuella Ghazi, Vipul, MD .  sodium chloride flush (NS) 0.9 % injection 5 mL, 5 mL, Intracatheter, Q8H, Criselda Peaches, MD, 5 mL at 12/28/20 1502 .  thiamine tablet 100 mg, 100 mg, Oral, Daily, Sharen Hones, MD, 100 mg at 12/28/20 1501  Facility-Administered Medications Ordered in Other Encounters:  .  sodium chloride flush (NS) 0.9 % injection 10 mL, 10 mL, Intravenous, PRN, Mike Gip, Melissa C, MD, 10 mL at 11/11/19 3419    ALLERGIES   Patient has no known allergies.     REVIEW OF SYSTEMS    Review of Systems:  Gen:  Denies  fever, sweats, chills weigh loss  HEENT: Denies blurred vision, double vision, ear pain, eye pain, hearing loss, nose bleeds, sore throat Cardiac:  No dizziness, chest pain or heaviness, chest tightness,edema Resp:   Denies cough or sputum porduction, shortness of breath,wheezing, hemoptysis,  Gi: Denies swallowing difficulty, stomach pain, nausea or vomiting, diarrhea, constipation, bowel incontinence Gu:  Denies bladder incontinence, burning urine Ext:   Denies Joint pain, stiffness or swelling Skin: Denies   skin rash, easy bruising or bleeding or hives Endoc:  Denies polyuria, polydipsia , polyphagia or weight change Psych:   Denies depression, insomnia or hallucinations   Other:  All other systems negative   VS: BP (!) 144/99 (BP Location: Left Arm)   Pulse 97   Temp 97.9 F (36.6 C) (Oral)   Resp 18   Ht 6' (1.829 m)   Wt 70.8 kg   SpO2 95%   BMI 21.17 kg/m      PHYSICAL EXAM    GENERAL:NAD, no fevers, chills, no weakness no fatigue HEAD: Normocephalic, atraumatic.  EYES: Pupils equal, round, reactive to light. Extraocular muscles intact. No scleral  icterus.  MOUTH: Moist mucosal membrane. Dentition intact. No abscess noted.  EAR, NOSE, THROAT: Clear without exudates. No external lesions.  NECK: Supple. No thyromegaly. No nodules. No JVD.  PULMONARY: Decreased BS on left CARDIOVASCULAR: S1 and S2. Regular rate and rhythm. No murmurs, rubs, or gallops. No edema. Pedal pulses 2+ bilaterally.  GASTROINTESTINAL: Soft, nontender, nondistended. No masses. Positive bowel sounds. No hepatosplenomegaly.  MUSCULOSKELETAL: No swelling, clubbing, or edema. Range of motion full in all extremities.  NEUROLOGIC: Cranial nerves II through XII are intact. No gross focal neurological deficits. Sensation intact. Reflexes intact.  SKIN: No ulceration, lesions, rashes, or cyanosis. Skin warm and dry. Turgor intact.  PSYCHIATRIC: Mood, affect within normal limits. The patient is awake, alert and oriented x 3. Insight, judgment intact.       IMAGING    CT ABDOMEN PELVIS WO CONTRAST  Result Date: 12/17/2020 CLINICAL DATA:  Nausea and vomiting EXAM: CT ABDOMEN AND PELVIS WITHOUT CONTRAST TECHNIQUE: Multidetector CT imaging of the abdomen and pelvis was performed following the standard protocol without IV contrast. COMPARISON:  CT 06/23/2020, PET CT 11/19/2020 FINDINGS: Lower chest: Lung bases demonstrate emphysema. Subpleural bandlike airspace disease at the left lower lobe redemonstrated.  Incompletely visualized subpleural peripheral left upper lobe nodule measuring 11 mm, series 5, image 1. Cardiomegaly with trace pericardial effusion. Coronary vascular calcification. Hepatobiliary: Extensive motion degradation. No focal hepatic abnormality. Gallstones. Gallbladder slightly distended. Possible wall thickening or pericholecystic fluid though limited by motion Pancreas: Unremarkable. No pancreatic ductal dilatation or surrounding inflammatory changes. Spleen: Normal in size without focal abnormality. Adrenals/Urinary Tract: Thickened appearance of adrenal glands. No hydronephrosis. The bladder is unremarkable Stomach/Bowel: Stomach nonenlarged. No dilated small bowel. Negative appendix. No acute bowel wall thickening Vascular/Lymphatic: Advanced aortic atherosclerosis. No aneurysm. Slightly increased multiple small retroperitoneal lymph nodes. Reproductive: Prostate is unremarkable. Other: Negative for free air or free fluid Musculoskeletal: No acute osseous abnormality. IMPRESSION: 1. Degraded by motion. 2. Gallbladder appears slightly distended and there are gallstones. Possible gallbladder wall thickening or pericholecystic fluid though limited by motion, consider correlation with ultrasound 3. Cardiomegaly with trace pericardial effusion. 4. Similar subpleural bandlike density at left lower lobe. Incompletely visualized peripheral left upper lobe lung nodule characterized on recent PET CT Electronically Signed   By: Donavan Foil M.D.   On: 12/17/2020 16:50   CT ANGIO HEAD NECK W WO CM  Result Date: 12/21/2020 CLINICAL DATA:  Neuro deficit, acute, stroke suspected. Neutropenic sepsis. EXAM: CT ANGIOGRAPHY HEAD AND NECK TECHNIQUE: Multidetector CT imaging of the head and neck was performed using the standard protocol during bolus administration of intravenous contrast. Multiplanar CT image reconstructions and MIPs were obtained to evaluate the vascular anatomy. Carotid stenosis measurements  (when applicable) are obtained utilizing NASCET criteria, using the distal internal carotid diameter as the denominator. CONTRAST:  72mL OMNIPAQUE IOHEXOL 350 MG/ML SOLN COMPARISON:  MRI of the brain December 20, 2020. FINDINGS: CT HEAD FINDINGS Brain: Confluent hypodensity of the white matter of the cerebral hemispheres, nonspecific, most likely related to chronic small vessel ischemia. Foci of acute infarct are more conspicuous on recent MRI of the brain. No new large territorial infarct. No hemorrhage, hydrocephalus, mass or midline shift. Vascular: No hyperdense vessel or unexpected calcification. Skull: Normal. Negative for fracture or focal lesion. Sinuses: Increased thickness of the walls of the bilateral maxillary sinuses with bubbly secretion on the left. Findings are suggestive of chronic sinusitis. Right mastoid effusion. Orbits: No acute finding. Review of the MIP images confirms the above findings  CTA NECK FINDINGS Aortic arch: Standard branching. Imaged portion shows no evidence of aneurysm or dissection. Mild atherosclerotic changes of the aortic arch without significant stenosis of the major arch vessel origins. Right carotid system: Calcified atherosclerotic disease of the right carotid bifurcation without hemodynamically significant stenosis. Left carotid system: Atherosclerotic disease of the left carotid bifurcation with mixed density plaques without hemodynamically significant stenosis. Vertebral arteries: Calcified atherosclerotic plaque at the origin of the right vertebral artery without hemodynamically significant stenosis. Mild atherosclerotic changes at the origin of the left vertebral artery without hemodynamically significant stenosis. Cervical vertebral arteries otherwise have normal caliber Skeleton: Degenerative changes of the cervical spine with fusion of the facet joints at C2-3 on the left. No acute findings. Other neck: Negative. Upper chest: Left-sided pleural effusion and  atelectasis. Mediastinal lymphadenopathy. Review of the MIP images confirms the above findings CTA HEAD FINDINGS Anterior circulation: Calcified atherosclerotic plaques in the bilateral carotid siphons. No significant stenosis, proximal occlusion, aneurysm, or vascular malformation. Posterior circulation: No significant stenosis, proximal occlusion, aneurysm, or vascular malformation. Hypoplastic/aplastic right P1/PCA segment with right fetal PCA. Venous sinuses: As permitted by contrast timing, patent. Anatomic variants: Right fetal PCA. Review of the MIP images confirms the above findings IMPRESSION: 1. Advanced chronic microvascular ischemic changes of the white matter. 2. Atherosclerotic disease of the bilateral carotid bifurcation without hemodynamically significant stenosis. 3. Mild atherosclerotic changes at the origin of the bilateral vertebral arteries without hemodynamically significant stenosis. 4. Mild intracranial atherosclerotic disease without hemodynamically significant stenosis. Electronically Signed   By: Pedro Earls M.D.   On: 12/21/2020 15:32   DG Chest 1 View  Result Date: 12/25/2020 CLINICAL DATA:  Thoracentesis.  Lung cancer EXAM: CHEST  1 VIEW COMPARISON:  Radiograph 12/25/2020 FINDINGS: Interval reduction in LEFT pleural fluid following thoracentesis. No pneumothorax appreciated. A small basilar effusion remains. RIGHT lung clear. Port in the anterior chest wall with tip in distal SVC. IMPRESSION: 1. No pneumothorax appreciated following LEFT thoracentesis. 2. Considerable reduction in LEFT pleural fluid. Electronically Signed   By: Suzy Bouchard M.D.   On: 12/25/2020 17:28   DG Chest 2 View  Result Date: 12/27/2020 CLINICAL DATA:  Status post thoracentesis 2 days prior. EXAM: CHEST - 2 VIEW COMPARISON:  Radiograph same day (12/27/2020 at 830 hours FINDINGS: Persistent near complete opacification of the LEFT hemithorax. Small amount of aerated lung remains at  LEFT lung apex. Port in place. RIGHT lung clear. IMPRESSION: No interval change in near complete opacification LEFT hemithorax. Electronically Signed   By: Suzy Bouchard M.D.   On: 12/27/2020 18:17   CT HEAD WO CONTRAST (5MM)  Result Date: 12/17/2020 CLINICAL DATA:  Nausea vomiting EXAM: CT HEAD WITHOUT CONTRAST TECHNIQUE: Contiguous axial images were obtained from the base of the skull through the vertex without intravenous contrast. COMPARISON:  CT brain 06/24/2020, MRI 03/10/2020, head CT 11/22/2019 FINDINGS: Brain: No acute territorial infarction, hemorrhage or intracranial mass. Extensive white matter disease, progressed from more remote exams from 2021. Stable ventricle size. Chronic lacunar infarcts within the left white matter. Vascular: No hyperdense vessel.  Carotid vascular calcification Skull: Normal. Negative for fracture or focal lesion. Sinuses/Orbits: Mucosal thickening the sinuses. Chronic appearing nasal deformity Other: None IMPRESSION: 1. No definite CT evidence for acute intracranial abnormality. 2. Atrophy. Extensive white matter disease, grossly stable as compared with recent head CT from March, appears progressive when compared to exams from 2021 and prior Electronically Signed   By: Donavan Foil M.D.   On: 12/17/2020  16:39   MR BRAIN W WO CONTRAST  Addendum Date: 12/21/2020   ADDENDUM REPORT: 12/21/2020 14:49 ADDENDUM: Patient returns for repeat postcontrast imaging. There is no abnormal enhancement. No evidence of intracranial metastatic disease. Electronically Signed   By: Macy Mis M.D.   On: 12/21/2020 14:49   Result Date: 12/21/2020 CLINICAL DATA:  Neuro deficit, acute, stroke suspected Small cell lung cancer, monitor EXAM: MRI HEAD WITHOUT AND WITH CONTRAST TECHNIQUE: Multiplanar, multiecho pulse sequences of the brain and surrounding structures were obtained without and with intravenous contrast. CONTRAST:  7.1mL GADAVIST GADOBUTROL 1 MMOL/ML IV SOLN COMPARISON:  CT  head 12/17/2020. FINDINGS: Mildly motion limited exam.  Within this limitation: Brain: Multiple small areas of restricted diffusion in the bilateral frontal and occipital, and left parietal matter, left perirolandic cortex, left thalamocapsular region, and left cerebellum. Advanced confluent T2 hyperintensity within the white matter, nonspecific but compatible with chronic microvascular ischemic disease and progressed since 2021. Small remote infarct in the left corona radiata. No hydrocephalus, obvious mass lesion, midline shift, acute hemorrhage, or extra-axial fluid collection. Punctate foci of susceptibility artifact in the left cerebellum and left frontal white matter, nonspecific but potentially related to prior microhemorrhage. Contrast was administered; however, there is no evidence of enhancement (for example, the nasal mucosa/turbinates are nonenhancing). This precludes evaluation for enhancing lesions. Vascular: Major arterial flow voids are maintained at the skull base. Skull and upper cervical spine: Normal marrow signal. Sinuses/Orbits: Mild paranasal sinus disease. Other: Moderate right and small left mastoid effusions. IMPRESSION: 1. Multiple small areas of restricted diffusion in the bilateral frontal and occipital and left parietal white matter, left perirolandic cortex, left thalamocapsular region, and left cerebellum, which are concerning for acute infarcts. Given involvement of multiple vascular territories, consider a central embolic etiology. 2. Contrast was administered; however, there is no evidence of enhancement, unclear why given reported uneventful injection per the technologist. This precludes evaluation for enhancing lesions. Given the patient's known small cell lung cancer diagnosis, recommend repeat postcontrast imaging to exclude metastatic disease. I've asked the MRI technologist to not bill the patient for the repeat post-contrast imaging. 3. Advanced chronic microvascular  ischemic disease, progressed since 2021. 4. Moderate right and small left mastoid effusions. Electronically Signed: By: Margaretha Sheffield M.D. On: 12/20/2020 14:29   IR Perc Cholecystostomy  Result Date: 12/20/2020 INDICATION: 70 year old male with small cell lung cancer and chemotherapy induced pancytopenia presenting with neutropenic sepsis. Blood cultures grew out Klebsiella and imaging findings are concerning for acute cholecystitis. He is too sick and frail to be considered an operative candidate. Following transfusion of platelets, his platelets are now at an acceptable level to proceed with percutaneous cholecystostomy tube placement. EXAM: CHOLECYSTOSTOMY MEDICATIONS: In patient currently receiving intravenous antibiotic therapy. No additional antibiotic prophylaxis administered. ANESTHESIA/SEDATION: Moderate (conscious) sedation was employed during this procedure. A total of Versed 1 mg and Fentanyl 50 mcg and 0.5 mg Dilaudid administered intravenously. Moderate Sedation Time: 13 minutes. The patient's level of consciousness and vital signs were monitored continuously by radiology nursing throughout the procedure under my direct supervision. FLUOROSCOPY TIME:  Fluoroscopy Time: 0 minutes 30 seconds (4.3 mGy). COMPLICATIONS: None immediate. PROCEDURE: Informed written consent was obtained from the patient after a thorough discussion of the procedural risks, benefits and alternatives. All questions were addressed. Maximal Sterile Barrier Technique was utilized including caps, mask, sterile gowns, sterile gloves, sterile drape, hand hygiene and skin antiseptic. A timeout was performed prior to the initiation of the procedure. The right upper quadrant was  interrogated with ultrasound. The distended gallbladder with a thickened wall was successfully identified. There is significant excursion of the liver with respiration due to use of abdominal muscles. A suitable skin entry site was selected. Local  anesthesia was attained by infiltration with 1% lidocaine. A small dermatotomy was made. Under real-time ultrasound guidance, the movement of the liver was timed with respirations and in a single pass, a 21 gauge Accustick needle was advanced through the liver and into the gallbladder lumen along a short transhepatic course. A 0.018 wire was quickly advanced into the gallbladder lumen and the needle was exchanged for the transitional dilator. The transitional dilator was advanced into the gallbladder lumen. The wire was removed. Contrast injection was performed under fluoroscopy confirming opacification of the gallbladder lumen. Multiple filling defects consistent with cholelithiasis. The cystic duct is occluded. A 0.035 wire was then coiled within the gallbladder lumen. The transitional dilator was removed. The transhepatic tract was dilated to 10 [place] with a stiff dilator. A Cook 10.2 [place] all-purpose drainage catheter was then advanced over the wire and formed in the gallbladder lumen. Images were obtained and stored for the medical record. The catheter was gently flushed and connected to gravity bag drainage before being secured to the skin with 0 silk suture. IMPRESSION: Successful placement of 10 French transhepatic percutaneous cholecystostomy tube for an indication of calculus cholecystitis. PLAN: Maintain tube to gravity bag drainage. Patient will likely need home health to assist with care and cleaning of the tube site. Return to interventional radiology approximately every 8 weeks for cholecystostomy tube check and exchange. If patient's clinical status improves in the future, elective cholecystectomy would be optimal. If patient remains a poor operative candidate, long-term cholecystostomy tube maintenance will likely be required. Electronically Signed   By: Jacqulynn Cadet M.D.   On: 12/20/2020 13:50   DG Chest Port 1 View  Result Date: 12/27/2020 CLINICAL DATA:  Shortness of breath with  atelectasis common thoracentesis 2 days ago EXAM: PORTABLE CHEST 1 VIEW COMPARISON:  Chest radiograph 12/25/2020 FINDINGS: The left chest wall port is in stable position with the tip terminating in the region of the right atrium. The cardiomediastinal silhouette can not be assessed. The left pleural effusion has increased in size with worsening aeration of the left upper lobe. Aeration of the right lung is not significantly changed. There is no significant right effusion. There is no appreciable pneumothorax. There is no acute osseous abnormality. Enteric contrast is noted in the colon. IMPRESSION: Increased size of the large left pleural effusion with worsened aeration of the left lung. Electronically Signed   By: Valetta Mole M.D.   On: 12/27/2020 09:12   DG Chest Port 1 View  Result Date: 12/25/2020 CLINICAL DATA:  Shortness of breath EXAM: PORTABLE CHEST 1 VIEW COMPARISON:  Chest x-ray 12/17/2020, PET CT 11/19/2020 FINDINGS: Left-sided central venous port tip over right atrium. Interval complete opacification of left thorax, suspect that there is at least moderate pleural effusion. Cardiomediastinal silhouette is obscured. No pneumothorax is visualized. IMPRESSION: Interval opacification of the entire left thorax which may be due to combination of pleural effusion/atelectasis/airspace disease. Electronically Signed   By: Donavan Foil M.D.   On: 12/25/2020 15:19   DG Chest Port 1 View  Result Date: 12/17/2020 CLINICAL DATA:  Questionable sepsis.  Weakness EXAM: PORTABLE CHEST 1 VIEW COMPARISON:  03/09/2020 FINDINGS: Left subclavian approached chest port remains in place. Stable cardiomegaly. Chronic left basilar scarring or atelectasis. Peripheral left upper lobe nodule better seen  on prior CT. No new airspace consolidation. Pleural effusion or pneumothorax. IMPRESSION: Chronic left basilar scarring or atelectasis. No new or acute findings. Electronically Signed   By: Davina Poke D.O.   On:  12/17/2020 13:20   DG Swallowing Func-Speech Pathology  Result Date: 12/25/2020 Table formatting from the original result was not included. Objective Swallowing Evaluation: Type of Study: MBS-Modified Barium Swallow Study  Patient Details Name: KELLIS MCADAM MRN: [id number] Date of Birth: [birth year] Today's Date: 12/25/2020 Time: SLP Start Time (ACUTE ONLY): 65 -SLP Stop Time (ACUTE ONLY): 1300 SLP Time Calculation (min) (ACUTE ONLY): 30 min Past Medical History: Past Medical History: Diagnosis Date . A-fib ([place]) 01/10/2019  pt st this was dx by Dr. Ubaldo Glassing . Cancer ([place]) 2021  LUNG . Complication of anesthesia   DIFFICULTY WAKING UP AFTER SURGERY- 20 YRS AGO . Hypertension  . Lung cancer ([place])  . Substance abuse [facility] - [facility])  Past Surgical History: Past Surgical History: Procedure Laterality Date . ARM DEBRIDEMENT Left   INCISION AND DEBRIDEMENT LOWER ARM -20 YRS AGO . BACK SURGERY   . IR PERC CHOLECYSTOSTOMY  12/20/2020 . PORTACATH PLACEMENT Left 08/29/2019  Procedure: INSERTION PORT-A-CATH;  Surgeon: Nestor Lewandowsky, MD;  Location: [facility] ORS;  Service: General;  Laterality: Left; HPI: Pt is a 70 year old male with PMH of 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 fib not on AC, chronic hep C, chronic cancer pain, HTN, alcohol abuse, malnutrition and polysubstance who is admitted with neutropenic sepsis due to acute cholecystitis and Klebsiella bacteremia now s/p cholecystostomy on 9/11 by IR. Hospital admission complicated by acute thromboembolic stroke with new right-sided weakness-confirmed on MRI brain on 12/20/2020.  MRI: Multiple small areas of restricted diffusion in the bilateral  frontal and occipital and left parietal white matter, left  perirolandic cortex, left thalamocapsular region, and left  cerebellum, which are concerning for acute infarcts.  CXR at admit: Chronic left basilar scarring or atelectasis. No new or acute  findings.  Pt is followed by Palliative Care at [facility].  Subjective: pt able to communicate his wants/needs Assessment / Plan / Recommendation CHL IP CLINICAL IMPRESSIONS 12/25/2020 Clinical Impression Pt presents with adequate airway protection while consuming thin liquids, nectar thick liquids, puree and whole barium tablet with thin liquids. Pt's oral phase is unremarkable and his swallow response is timely with good airway protection. His overall medical comorbidities and respiratory decline place him at an overall increased risk of aspiration. For example, when consuming consecutive sips of thin liquids, pt became short of breath. At this time, recommend pt continue consuming dysphagia 3 diet for energy conservation with thin liquids, medicine whole with thin liquids. Pt needs to follow strict aspiration precautions such as taking single sips and rest breaks. SLP Visit Diagnosis Dysphagia, oropharyngeal phase (R13.12) Attention and concentration deficit following -- Frontal lobe and executive function deficit following -- Impact on safety and function Mild aspiration risk;Moderate aspiration risk   CHL IP TREATMENT RECOMMENDATION 12/25/2020 Treatment Recommendations No treatment recommended at this time   Prognosis 12/24/2020 Prognosis for Safe Diet Advancement Fair Barriers to Reach Goals Motivation;Time post onset;Severity of deficits;Behavior Barriers/Prognosis Comment -- CHL IP DIET RECOMMENDATION 12/25/2020 SLP Diet Recommendations Dysphagia 3 (Mech soft) solids;Thin liquid Liquid Administration via Cup Medication Administration Whole meds with liquid Compensations Minimize environmental distractions;Slow rate;Small sips/bites Postural Changes Remain semi-upright after after feeds/meals (Comment);Seated upright at 90 degrees   CHL IP OTHER RECOMMENDATIONS 12/25/2020 Recommended Consults -- Oral Care Recommendations Oral  care BID Other Recommendations --   CHL IP FOLLOW UP RECOMMENDATIONS 12/25/2020 Follow up Recommendations None   CHL IP FREQUENCY AND  DURATION 12/24/2020 Speech Therapy Frequency (ACUTE ONLY) min 3x week Treatment Duration 2 weeks      CHL IP ORAL PHASE 12/25/2020 Oral Phase WFL Oral - Pudding Teaspoon -- Oral - Pudding Cup -- Oral - Honey Teaspoon -- Oral - Honey Cup -- Oral - Nectar Teaspoon -- Oral - Nectar Cup -- Oral - Nectar Straw -- Oral - Thin Teaspoon -- Oral - Thin Cup -- Oral - Thin Straw -- Oral - Puree -- Oral - Mech Soft -- Oral - Regular -- Oral - Multi-Consistency -- Oral - Pill -- Oral Phase - Comment --  CHL IP PHARYNGEAL PHASE 12/25/2020 Pharyngeal Phase WFL Pharyngeal- Pudding Teaspoon -- Pharyngeal -- Pharyngeal- Pudding Cup -- Pharyngeal -- Pharyngeal- Honey Teaspoon -- Pharyngeal -- Pharyngeal- Honey Cup -- Pharyngeal -- Pharyngeal- Nectar Teaspoon -- Pharyngeal -- Pharyngeal- Nectar Cup -- Pharyngeal -- Pharyngeal- Nectar Straw -- Pharyngeal -- Pharyngeal- Thin Teaspoon -- Pharyngeal -- Pharyngeal- Thin Cup -- Pharyngeal -- Pharyngeal- Thin Straw -- Pharyngeal -- Pharyngeal- Puree -- Pharyngeal -- Pharyngeal- Mechanical Soft -- Pharyngeal -- Pharyngeal- Regular -- Pharyngeal -- Pharyngeal- Multi-consistency -- Pharyngeal -- Pharyngeal- Pill -- Pharyngeal -- Pharyngeal Comment --  CHL IP CERVICAL ESOPHAGEAL PHASE 12/25/2020 Cervical Esophageal Phase WFL Pudding Teaspoon -- Pudding Cup -- Honey Teaspoon -- Honey Cup -- Nectar Teaspoon -- Nectar Cup -- Nectar Straw -- Thin Teaspoon -- Thin Cup -- Thin Straw -- Puree -- Mechanical Soft -- Regular -- Multi-consistency -- Pill -- Cervical Esophageal Comment -- Happi B. [name] M.S., CCC-SLP, [place] Office [phone] Stormy Fabian 12/25/2020, 2:17 PM              ECHOCARDIOGRAM COMPLETE  Result Date: 12/21/2020    ECHOCARDIOGRAM REPORT   Patient Name:   TYVION EDMONDSON Date of Exam: 12/21/2020 Medical Rec #:  [id number]    Height:       72.0 in Accession #:    [id number]   Weight:       137.1 lb Date of Birth:  July 29, 1950     BSA:           1.815 m Patient Age:    36 years     BP:           114/80 mmHg Patient Gender: M            HR:           89 bpm. Exam Location:  [facility] Procedure: 2D Echo, Color Doppler and Cardiac Doppler Indications:     Endocarditis I38  History:         Patient has prior history of Echocardiogram examinations, most                  recent 08/19/2019. Arrythmias:Atrial Fibrillation; Risk                  Factors:Hypertension. Substance abuse.  Sonographer:     Sherrie Sport Referring Phys:  [place] Diagnosing Phys: Serafina Royals MD IMPRESSIONS  1. Left ventricular ejection fraction, by estimation, is 60 to 65%. The left ventricle has normal function. The left ventricle has no regional wall motion abnormalities. Left ventricular diastolic parameters were normal.  2. Right ventricular systolic function is normal. The right ventricular size is normal.  3. The mitral valve is normal in structure. Trivial mitral valve  regurgitation.  4. The aortic valve is normal in structure. Aortic valve regurgitation is not visualized. Mild aortic valve sclerosis is present, with no evidence of aortic valve stenosis. Conclusion(s)/Recommendation(s): No evidence of valve vegetations. FINDINGS  Left Ventricle: Left ventricular ejection fraction, by estimation, is 60 to 65%. The left ventricle has normal function. The left ventricle has no regional wall motion abnormalities. The left ventricular internal cavity size was small. There is no left ventricular hypertrophy. Left ventricular diastolic parameters were normal. Right Ventricle: The right ventricular size is normal. No increase in right ventricular wall thickness. Right ventricular systolic function is normal. Left Atrium: Left atrial size was normal in size. Right Atrium: Right atrial size was normal in size. Pericardium: There is no evidence of pericardial effusion. Mitral Valve: The mitral valve is normal in structure. Trivial mitral valve regurgitation. Tricuspid Valve: The  tricuspid valve is normal in structure. Tricuspid valve regurgitation is trivial. Aortic Valve: The aortic valve is normal in structure. Aortic valve regurgitation is not visualized. Mild aortic valve sclerosis is present, with no evidence of aortic valve stenosis. Aortic valve mean gradient measures 3.0 mmHg. Aortic valve peak gradient measures 5.9 mmHg. Aortic valve area, by VTI measures 2.30 cm. Pulmonic Valve: The pulmonic valve was normal in structure. Pulmonic valve regurgitation is not visualized. Aorta: The aortic root and ascending aorta are structurally normal, with no evidence of dilitation. IAS/Shunts: No atrial level shunt detected by color flow Doppler.  LEFT VENTRICLE PLAX 2D LVIDd:         3.87 cm LVIDs:         2.30 cm LV PW:         1.00 cm LV IVS:        1.18 cm LVOT diam:     2.10 cm LV SV:         44 LV SV Index:   24 LVOT Area:     3.46 cm  RIGHT VENTRICLE RV Basal diam:  3.70 cm RV S prime:     12.40 cm/s TAPSE (M-mode): 3.7 cm LEFT ATRIUM           Index       RIGHT ATRIUM           Index LA diam:      3.50 cm 1.93 cm/m  RA Area:     33.70 cm LA Vol (A2C): 57.4 ml 31.63 ml/m RA Volume:   128.00 ml 70.53 ml/m LA Vol (A4C): 72.1 ml 39.73 ml/m  AORTIC VALVE                   PULMONIC VALVE AV Area (Vmax):    2.34 cm    PV Vmax:        0.60 m/s AV Area (Vmean):   2.32 cm    PV Peak grad:   1.4 mmHg AV Area (VTI):     2.30 cm    RVOT Peak grad: 2 mmHg AV Vmax:           121.00 cm/s AV Vmean:          86.000 cm/s AV VTI:            0.193 m AV Peak Grad:      5.9 mmHg AV Mean Grad:      3.0 mmHg LVOT Vmax:         81.60 cm/s LVOT Vmean:        57.600 cm/s LVOT VTI:  0.128 m LVOT/AV VTI ratio: 0.66  AORTA Ao Root diam: 3.50 cm MITRAL VALVE               TRICUSPID VALVE MV Area (PHT): 4.52 cm    TR Peak grad:   24.4 mmHg MV Decel Time: 168 msec    TR Vmax:        247.00 cm/s MV E velocity: 88.30 cm/s                            SHUNTS                            Systemic VTI:  0.13 m                             Systemic Diam: 2.10 cm Serafina Royals MD Electronically signed by Serafina Royals MD Signature Date/Time: 12/21/2020/5:20:09 PM    Final    US Abdomen Limited RUQ (LIVER/GB)  Result Date: 12/18/2020 CLINICAL DATA:  Evaluate for cholecystitis. EXAM: ULTRASOUND ABDOMEN LIMITED RIGHT UPPER QUADRANT COMPARISON:  CT AP from 12/17/2020 FINDINGS: Gallbladder: The gallbladder wall is diffusely edematous measuring up to 13.1 mm. Gallstones are noted measuring up to 5.9 mm. Pericholecystic fluid. Positive sonographic Murphy's sign. Common bile duct: Diameter: 6 mm Liver: Diffusely increased parenchymal echogenicity identified. No focal liver abnormality. Portal vein is patent on color Doppler imaging with normal direction of blood flow towards the liver. Other: 1.9 cm right kidney cyst. IMPRESSION: Gallstones, gallbladder wall thickening and pericholecystic fluid with positive sonographic Murphy's sign. Findings are compatible with acute cholecystitis. Electronically Signed   By: Kerby Moors M.D.   On: 12/18/2020 14:18   US THORACENTESIS ASP PLEURAL SPACE W/IMG GUIDE  Result Date: 12/25/2020 INDICATION: Large left pleural effusion secondary to known lung cancer. Request for diagnostic and therapeutic thoracentesis. EXAM: ULTRASOUND GUIDED LEFT THORACENTESIS MEDICATIONS: 1% lidocaine 10 mL COMPLICATIONS: None immediate. PROCEDURE: An ultrasound guided thoracentesis was thoroughly discussed with the patient and questions answered. The benefits, risks, alternatives and complications were also discussed. The patient understands and wishes to proceed with the procedure. Written consent was obtained. Ultrasound was performed to localize and mark an adequate pocket of fluid in the left chest. The area was then prepped and draped in the normal sterile fashion. 1% Lidocaine was used for local anesthesia. Under ultrasound guidance a 6 Fr Safe-T-Centesis catheter was introduced. Thoracentesis was  performed. The catheter was removed and a dressing applied. FINDINGS: A total of approximately 700 mL of hazy yellow fluid was removed. Samples were sent to the laboratory as requested by the clinical team. IMPRESSION: Successful ultrasound guided left thoracentesis yielding 700 mL of pleural fluid. Read by: Gareth Eagle, PA-C Electronically Signed   By: Michaelle Birks M.D.   On: 12/25/2020 16:44      ASSESSMENT/PLAN   Acute on chronic hypoxemic respiratory failure There is complete atelectasis of left lung secondary to left pleural effusion     - stop heparin transiently and perform diagnostic and therapeutic thoracentesis   - Pleural fluid studies ordered both infections and malignant workup    - Reviewed with patient he is agreeable to plan -metaneb bid with RT  Sepsis with Klebsiella bacteremia  On IV antibiotics    - ID on case appreciate input   Extensive stage small cell lung ca Currently on chemo/immunotherapy  -  oncology on case - prognosis poor - appreciate input   Thank you for allowing me to participate in the care of this patient.  Total face to face encounter time for this patient visit was >17min. >50% of the time was  spent in counseling and coordination of care.   Patient/Family are satisfied with care plan and all questions have been answered.  This document was prepared using Dragon voice recognition software and may include unintentional dictation errors.     Ottie Glazier, M.D.  Division of [place]

## 2020-12-29 ENCOUNTER — Inpatient Hospital Stay: Payer: Medicare Other

## 2020-12-29 ENCOUNTER — Inpatient Hospital Stay: Payer: Medicare Other | Admitting: Oncology

## 2020-12-29 DIAGNOSIS — J90 Pleural effusion, not elsewhere classified: Secondary | ICD-10-CM

## 2020-12-29 DIAGNOSIS — F1010 Alcohol abuse, uncomplicated: Secondary | ICD-10-CM

## 2020-12-29 DIAGNOSIS — Z9889 Other specified postprocedural states: Secondary | ICD-10-CM

## 2020-12-29 DIAGNOSIS — K819 Cholecystitis, unspecified: Secondary | ICD-10-CM | POA: Diagnosis not present

## 2020-12-29 DIAGNOSIS — C3490 Malignant neoplasm of unspecified part of unspecified bronchus or lung: Secondary | ICD-10-CM | POA: Diagnosis not present

## 2020-12-29 DIAGNOSIS — A419 Sepsis, unspecified organism: Secondary | ICD-10-CM | POA: Diagnosis not present

## 2020-12-29 DIAGNOSIS — R7881 Bacteremia: Secondary | ICD-10-CM | POA: Diagnosis not present

## 2020-12-29 DIAGNOSIS — I484 Atypical atrial flutter: Secondary | ICD-10-CM | POA: Diagnosis not present

## 2020-12-29 DIAGNOSIS — D6481 Anemia due to antineoplastic chemotherapy: Secondary | ICD-10-CM | POA: Diagnosis not present

## 2020-12-29 DIAGNOSIS — B961 Klebsiella pneumoniae [K. pneumoniae] as the cause of diseases classified elsewhere: Secondary | ICD-10-CM | POA: Diagnosis not present

## 2020-12-29 LAB — BODY FLUID CULTURE W GRAM STAIN
Culture: NO GROWTH
Culture: NO GROWTH

## 2020-12-29 LAB — RETIC PANEL
Immature Retic Fract: 34.9 % — ABNORMAL HIGH (ref 2.3–15.9)
RBC.: 2.51 MIL/uL — ABNORMAL LOW (ref 4.22–5.81)
Retic Count, Absolute: 83.8 10*3/uL (ref 19.0–186.0)
Retic Ct Pct: 3.3 % — ABNORMAL HIGH (ref 0.4–3.1)
Reticulocyte Hemoglobin: 30 pg (ref >27.9)

## 2020-12-29 LAB — CBC
HCT: 24 % — ABNORMAL LOW (ref 39.0–52.0)
Hemoglobin: 8.4 g/dL — ABNORMAL LOW (ref 13.0–17.0)
MCH: 33.7 pg (ref 26.0–34.0)
MCHC: 35 g/dL (ref 30.0–36.0)
MCV: 96.4 fL (ref 80.0–100.0)
Platelets: 508 10*3/uL — ABNORMAL HIGH (ref 150–400)
RBC: 2.49 MIL/uL — ABNORMAL LOW (ref 4.22–5.81)
RDW: 13.7 % (ref 11.5–15.5)
WBC: 14.7 10*3/uL — ABNORMAL HIGH (ref 4.0–10.5)
nRBC: 0 % (ref 0.0–0.2)

## 2020-12-29 LAB — HEPARIN LEVEL (UNFRACTIONATED)
Heparin Unfractionated: 0.27 IU/mL — ABNORMAL LOW (ref 0.30–0.70)
Heparin Unfractionated: <0.1 IU/mL — ABNORMAL LOW (ref 0.30–0.70)

## 2020-12-29 LAB — CYTOLOGY - NON PAP

## 2020-12-29 MED ORDER — MEGESTROL ACETATE 400 MG/10ML PO SUSP
400.0000 mg | Freq: Two times a day (BID) | ORAL | Status: DC
  Administered 2020-12-29 – 2021-01-01 (×4): 400 mg via ORAL
  Filled 2020-12-29 (×7): qty 10

## 2020-12-29 MED ORDER — ALBUTEROL SULFATE (2.5 MG/3ML) 0.083% IN NEBU: 2.5000 mg | INHALATION_SOLUTION | Freq: Four times a day (QID) | RESPIRATORY_TRACT | Status: DC | PRN

## 2020-12-29 MED ORDER — SPIRONOLACTONE 25 MG PO TABS
50.0000 mg | ORAL_TABLET | Freq: Every day | ORAL | Status: DC
  Administered 2020-12-29 – 2021-01-04 (×6): 50 mg via ORAL
  Filled 2020-12-29 (×7): qty 2

## 2020-12-29 MED ORDER — DRONABINOL 2.5 MG PO CAPS
5.0000 mg | ORAL_CAPSULE | Freq: Two times a day (BID) | ORAL | Status: DC
  Administered 2020-12-29 – 2021-01-03 (×9): 5 mg via ORAL
  Filled 2020-12-29 (×9): qty 2

## 2020-12-29 MED ORDER — MIRTAZAPINE 15 MG PO TABS: 15.0000 mg | ORAL_TABLET | Freq: Every day | ORAL | Status: DC

## 2020-12-29 NOTE — Progress Notes (Addendum)
[place] for heparin infusion Indication: afib and possible thromboembolic stroke  No Known Allergies  Patient Measurements: Height: 6' (182.9 cm) Weight: 70.8 kg (156 lb 1.4 oz) IBW/kg (Calculated) : 77.6  Vital Signs: Temp: 97.9 F (36.6 C) (09/20 1558) Temp Source: Oral (09/20 1558) BP: 128/88 (09/20 1558) Pulse Rate: 102 (09/20 1558)  Labs: Recent Labs     0000 12/26/20 2242 12/27/20 0615 12/27/20 1158 12/27/20 2000 12/28/20 0515 12/28/20 1220 12/28/20 2250 12/29/20 0925 12/29/20 1740  HGB   < >  --  8.5*  --   --  8.5*  --   --  8.4*  --   HCT  --   --  24.1*  --   --  23.8*  --   --  24.0*  --   PLT  --   --  310  --   --  394  --   --  508*  --   APTT  --  64* 75* 63*  --   --   --   --   --   --   HEPARINUNFRC  --   --  0.25*  --    < > 0.16*   < > 0.28* 0.27* <0.10*  CREATININE  --   --  0.57*  --   --  0.62  --   --   --   --    < > = values in this interval not displayed.     Estimated Creatinine Clearance: 87.3 mL/min (by C-G formula based on SCr of 0.62 mg/dL).   Medical History: Past Medical History:  Diagnosis Date   A-fib ([place]) 01/10/2019   pt st this was dx by Dr. Ubaldo Glassing   Cancer [facility]) [id number]   LUNG   Complication of anesthesia    DIFFICULTY WAKING UP AFTER SURGERY- 20 YRS AGO   Hypertension    Lung cancer ([place])    Substance abuse ([place])     Medications:  Eliquis 5 mg BID - last dose 9/15 2017  Assessment: 70 year old M with PMH of stage IV small cell lung cancer on chemo, chemo induced pancytopenia, A. fib, chronic hep C, chronic cancer pain, HTN, alcohol abuse, malnutrition and polysubstance use presenting with nausea and vomiting and admitted with neutropenic sepsis due to acute cholecystitis and Klebsiella bacteremia. Pt with acute stroke confirmed on MRI 9/11. Pharmacy has been consulted for heparin dosing/monitoring.   Goal of Therapy:  Heparin level 0.3-0.5 units/ml - confirmed with MD to  utilize stroke goals Monitor platelets by anticoagulation protocol: Yes   Plan:  Heparin level <0.10: subtherapeutic - confirmed with nurse heparin was not held and no issues with line/infusion Increase heparin infusion rate to 2150 units/hr Recheck heparin level 6 hours after rate change. CBC daily while on heparin  Sherilyn Banker, PharmD 12/29/2020 7:05 PM

## 2020-12-29 NOTE — Progress Notes (Signed)
PROGRESS NOTE  Scott Jordan YQM:[id number] DOB: 02-06-1951   PCP: Earlie Server, MD  Patient is from: Home.   DOA: 12/17/2020 LOS: 12  Chief complaints:  Chief Complaint  Patient presents with   Emesis     Brief Narrative / Interim history: 70 year old M with PMH of stage IV small cell lung cancer on chemo, chemo induced pancytopenia, A. fib not on AC, chronic hep C, chronic cancer pain, HTN, alcohol abuse, malnutrition and polysubstance use presenting with nausea and vomiting and admitted with neutropenic sepsis due to acute cholecystitis and Klebsiella bacteremia.  He had cholecystostomy on 9/11 by IR.  Started on IV ceftriaxone and IV Flagyl.  Neutropenia resolved but he now has leukocytosis.  Infectious disease consulted  Hospital course complicated by acute thromboembolic CVA with new right-sided weakness for which she was evaluated by neurology and started on aspirin.  Eventually, thrombocytopenia improved and he was transitioned to Eliquis.   Patient continues to be deconditioned physically with poor p.o. intake.  Palliative medicine consulted.  CODE STATUS changed to DNR and DNI.  Family interested in feeding tube to optimize his nutrition so he can follow-up with oncology for chemotherapy.  IR consulted.  Now patient is refusing G-tube despite poor oral intake.  Patient also have recurrence left-sided pleural effusion likely from malignancy.  Pulmonology following.   Subjective: Seen and examined earlier this morning.  No major events overnight of this morning.  Feels tired and weak.  Also reports shortness of breath at times.  He denies pain.  Oral intake remains poor. He asked why he has fluid buildup around his lung.  When I told him it is likely because of his lung cancer, he says "I will just go to rehab and die"  Objective: Vitals:   12/28/20 2100 12/29/20 0500 12/29/20 0809 12/29/20 1558  BP: 133/87 118/79 (!) 122/93 128/88  Pulse:  81 (!) 107 (!) 102  Resp: 18  20 20    Temp: 97.8 F (36.6 C) 97.6 F (36.4 C) 97.8 F (36.6 C) 97.9 F (36.6 C)  TempSrc: Oral Oral Oral Oral  SpO2: 98% 98% 97% 98%  Weight:      Height:        Intake/Output Summary (Last 24 hours) at 12/29/2020 1608 Last data filed at 12/29/2020 5284 Gross per 24 hour  Intake --  Output 425 ml  Net -425 ml   Filed Weights   12/17/20 1203 12/18/20 2130 12/24/20 1428  Weight: 61.7 kg 62.2 kg 70.8 kg    Examination:  GENERAL: Frail and chronically ill-appearing. HEENT: MMM.  Vision and hearing grossly intact.  NECK: Supple.  No apparent JVD.  RESP: 98% on RA.  No IWOB.  Diminished aeration on the left. CVS:  RRR. Heart sounds normal.  ABD/GI/GU: BS+. Abd soft, NTND.  Biliary PERC drain in place. MSK/EXT:  Moves extremities.  Significant muscle mass and subcu fat loss. SKIN: no apparent skin lesion or wound NEURO: Awake and alert. Oriented appropriately.  No apparent focal neuro deficit. PSYCH: Calm.  Looks withdrawn.  Procedures:  9/11-percutaneous biliary drain by IR 9/16-left-sided thoracocentesis with removal of 700 cc fluid  Microbiology summarized: 9/8-COVID-19 and influenza PCR nonreactive. 9/8-C. difficile negative. 9/8-blood culture with pansensitive Klebsiella pneumonia except to ampicillin 9/8-urine culture NGTD 9/8-MRSA PCR screen positive 9/13-repeat blood cultures NGTD 9/16-pleural fluid cultures NGTD 9/16-pleural fluid AFS/AFC pending.   Assessment & Plan: Neutropenic sepsis-neutropenia resolved. Septicemia secondary to Klebsiella pneumoniae bacteremia. Cholelithiasis with cholecystitis secondary to Klebsiella pneumoniae. -Culture  data as above. -S/p percutaneous biliary drain by IR on 9/11. -TTE negative for vegetation.  -Completed 10 days of IV ceftriaxone on 9/19 per ID recommendation.  Also had 9 days of Flagyl.   Stage IV small cell lung cancer with osseous mets-s/p 1 cycle of chemo Pancytopenia related to chemotherapy-improved.  Now with  leukocytosis likely from malignancy. -Received 3 units of platelets this hospitalization.   Acute bilateral CVA-reportedly had right-sided hemiparesis although he seems to have left facial droop and left hemiparesis on my exam.  Felt to be thromboembolic in the setting of his A. Fib.   -Cardiology, neurology and ID did not feel TEE is necessary.  -Started on Eliquis on 9/15>> IV heparin on 9/16 as a bridge for G-tube placement -On IV heparin in case pulmonology considers Pleurx cath for his recurrent pleural effusion.  Paroxysmal A. fib with RVR-on Cardizem and Eliquis at home.  RVR resolved. -Continue p.o. Cardizem CD 240 mg daily -P.o. Cardizem 30 mg every 6 hours as needed sustained HR greater than 120 -Anticoagulation as above  Recurrent left pleural effusion/left atelectasis: Likely malignant.  S/p left thoracocentesis with removal of 700 cc on 9/16.  Fluid culture NGTD.  AFB smear negative.  Repeat CXR with left white out.  Saturating in upper 90s on RA. -Defer to pulmonology-Pleurx cath? -Follow fluid cytology -Incentive spirometry, OOB, PT/OT   AKI/azotemia: Resolved. Recent Labs    12/18/20 0500 12/19/20 0457 12/20/20 0500 12/21/20 0434 12/22/20 0513 12/23/20 0305 12/24/20 0506 12/25/20 0554 12/27/20 0615 12/28/20 0515  BUN 48* 43* 36* 30* 21 17 14 14 12 10   CREATININE 1.46* 0.94 0.72 0.57* 0.59* 0.69 0.66 0.71 0.57* 0.62  -Monitor intermittently  Hypokalemia/hypomagnesemia: Resolved.  Hyponatremia: Resolved.   Alcohol use disorder: No withdrawal signs or symptoms.  Physical deconditioning/debility -Incentive spirometry, OOB/PT/OT  Urinary tension?  Foley discontinued on 9/17.  Resolved.  Goals of care-patient with stage IV small cell lung cancer, acute thromboembolic CVA and other comorbidity as above.  Very poor long-term prognosis.  Initially interested NG tube, now refusing despite poor oral intake. He doesn't feel like talking much to discuss goal of care  further.  Currently he does not appear to be in distress.  -Palliative care following.  Dysphagia-SLP recommended dysphagia 3 diet. Severe protein calorie malnutrition/anorexia-significant muscle mass loss.  No improvement with stimulants and supplements.  Per dietitian, oral intake only 1% of required daily intake. Body mass index is 21.17 kg/m. Nutrition Problem: Severe Malnutrition Etiology: cancer and cancer related treatments Signs/Symptoms: severe fat depletion, severe muscle depletion Interventions: Prostat, MVI, Ensure Enlive (each supplement provides 350kcal and 20 grams of protein)   Pressure skin injury: Pressure Injury 12/18/20 Heel Right Deep Tissue Pressure Injury - Purple or maroon localized area of discolored intact skin or blood-filled blister due to damage of underlying soft tissue from pressure and/or shear. (Active)  12/18/20 2200  Location: Heel  Location Orientation: Right  Staging: Deep Tissue Pressure Injury - Purple or maroon localized area of discolored intact skin or blood-filled blister due to damage of underlying soft tissue from pressure and/or shear.  Wound Description (Comments):   Present on Admission:      Pressure Injury 12/18/20 Heel Left Deep Tissue Pressure Injury - Purple or maroon localized area of discolored intact skin or blood-filled blister due to damage of underlying soft tissue from pressure and/or shear. (Active)  12/18/20 2200  Location: Heel  Location Orientation: Left  Staging: Deep Tissue Pressure Injury - Purple or maroon localized  area of discolored intact skin or blood-filled blister due to damage of underlying soft tissue from pressure and/or shear.  Wound Description (Comments):   Present on Admission:    DVT prophylaxis:  Place and maintain sequential compression device Start: 12/18/20 1303 Place TED hose Start: 12/17/20 1523  Code Status: Full code Family Communication: Patient and/or RN.  Updated patient's niece over the  phone Level of care: Med-Surg Status is: Inpatient  Remains inpatient appropriate because:Ongoing diagnostic testing needed not appropriate for outpatient work up, Unsafe d/c plan, and Inpatient level of care appropriate due to severity of illness  Dispo: The patient is from: Home              Anticipated d/c is to: SNF              Patient currently is not medically stable to d/c.   Difficult to place patient No       Consultants:  Neurology-signed off Cardiology-signed off Oncology Palliative medicine Interventional radiology Pulmonology   Sch Meds:  Scheduled Meds:  sodium chloride   Intravenous Once   chlorhexidine  10 mL Mouth/Throat BID   Chlorhexidine Gluconate Cloth  6 each Topical Daily   diltiazem  240 mg Oral Daily   dronabinol  5 mg Oral BID AC   folic acid  1 mg Oral Daily   furosemide  20 mg Oral Daily   guaiFENesin  1,200 mg Oral BID   mirtazapine  15 mg Oral QHS   multivitamin with minerals  1 tablet Oral Daily   sodium chloride flush  10-40 mL Intracatheter Q12H   sodium chloride flush  5 mL Intracatheter Q8H   thiamine  100 mg Oral Daily   Continuous Infusions:  sodium chloride Stopped (12/25/20 1558)   sodium chloride 10 mL/hr (12/20/20 1220)   heparin 2,000 Units/hr (12/29/20 1022)   PRN Meds:.sodium chloride, sodium chloride, acetaminophen **OR** acetaminophen, albuterol, diltiazem, ondansetron **OR** ondansetron (ZOFRAN) IV, oxyCODONE-acetaminophen, sodium chloride flush  Antimicrobials: Anti-infectives (From admission, onward)    Start     Dose/Rate Route Frequency Ordered Stop   12/21/20 1200  metroNIDAZOLE (FLAGYL) IVPB 500 mg  Status:  Discontinued        500 mg 100 mL/hr over 60 Minutes Intravenous Every 12 hours 12/21/20 1023 12/26/20 1450   12/19/20 0600  cefTRIAXone (ROCEPHIN) 2 g in sodium chloride 0.9 % 100 mL IVPB  Status:  Discontinued        2 g 200 mL/hr over 30 Minutes Intravenous Every 24 hours 12/18/20 0842 12/28/20 1400    12/18/20 1800  vancomycin (VANCOREADY) IVPB 750 mg/150 mL  Status:  Discontinued        750 mg 150 mL/hr over 60 Minutes Intravenous Every 24 hours 12/17/20 1820 12/17/20 1830   12/18/20 1800  vancomycin (VANCOREADY) IVPB 750 mg/150 mL  Status:  Discontinued        750 mg 150 mL/hr over 60 Minutes Intravenous Every 24 hours 12/17/20 1830 12/18/20 0842   12/18/20 0600  ceFEPIme (MAXIPIME) 2 g in sodium chloride 0.9 % 100 mL IVPB  Status:  Discontinued        2 g 200 mL/hr over 30 Minutes Intravenous Every 12 hours 12/17/20 1544 12/17/20 1830   12/18/20 0600  ceFEPIme (MAXIPIME) 2 g in sodium chloride 0.9 % 100 mL IVPB        2 g 200 mL/hr over 30 Minutes Intravenous Every 12 hours 12/17/20 1830 12/18/20 1841   12/17/20 1700  vancomycin (  VANCOREADY) IVPB 1500 mg/300 mL        1,500 mg 150 mL/hr over 120 Minutes Intravenous  Once 12/17/20 1544 12/17/20 2050   12/17/20 1600  metroNIDAZOLE (FLAGYL) IVPB 500 mg  Status:  Discontinued        500 mg 100 mL/hr over 60 Minutes Intravenous Every 12 hours 12/17/20 1524 12/18/20 0842   12/17/20 1230  cefTRIAXone (ROCEPHIN) 2 g in sodium chloride 0.9 % 100 mL IVPB  Status:  Discontinued        2 g 200 mL/hr over 30 Minutes Intravenous Every 24 hours 12/17/20 1215 12/17/20 1543        I have personally reviewed the following labs and images: CBC: Recent Labs  Lab 12/24/20 0506 12/25/20 0554 12/26/20 0324 12/27/20 0615 12/28/20 0515 12/29/20 0925  WBC 14.8* 17.2* 14.9* 15.6* 14.5* 14.7*  NEUTROABS 9.1* 11.4*  --   --   --   --   HGB 10.2* 10.1* 9.1* 8.5* 8.5* 8.4*  HCT 28.4* 29.3* 26.3* 24.1* 23.8* 24.0*  MCV 99.0 97.7 99.2 98.4 97.5 96.4  PLT 133* 198 227 310 394 508*   BMP &GFR Recent Labs  Lab 12/23/20 0305 12/24/20 0506 12/25/20 0554 12/27/20 0615 12/28/20 0515  NA 141 142 141 137 136  K 3.0* 4.1 3.5 3.0* 3.8  CL 109 112* 112* 108 108  CO2 24 23 24 25 22   GLUCOSE 90 94 105* 98 93  BUN 17 14 14 12 10   CREATININE 0.69  0.66 0.71 0.57* 0.62  CALCIUM 7.6* 7.7* 7.7* 7.3* 7.6*  MG 1.7 1.8 1.6* 1.7 1.8  PHOS  --  2.8 2.8 2.9 2.9   Estimated Creatinine Clearance: 87.3 mL/min (by C-G formula based on SCr of 0.62 mg/dL). Liver & Pancreas: Recent Labs  Lab 12/24/20 0506 12/25/20 0554 12/27/20 0615 12/28/20 0515  AST 28 23  --   --   ALT 78* 63*  --   --   ALKPHOS 61 60  --   --   BILITOT 0.9 1.0  --   --   PROT 5.2* 5.4*  --   --   ALBUMIN 2.1* 2.0* 1.9* 1.9*   No results for input(s): LIPASE, AMYLASE in the last 168 hours. No results for input(s): AMMONIA in the last 168 hours. Diabetic: No results for input(s): HGBA1C in the last 72 hours.  No results for input(s): GLUCAP in the last 168 hours.  Cardiac Enzymes: Recent Labs  Lab 12/25/20 0554  CKTOTAL 62   No results for input(s): PROBNP in the last 8760 hours. Coagulation Profile: No results for input(s): INR, PROTIME in the last 168 hours.  Thyroid Function Tests: No results for input(s): TSH, T4TOTAL, FREET4, T3FREE, THYROIDAB in the last 72 hours. Lipid Profile: No results for input(s): CHOL, HDL, LDLCALC, TRIG, CHOLHDL, LDLDIRECT in the last 72 hours.  Anemia Panel: Recent Labs    12/29/20 0925  RETICCTPCT 3.3*   Urine analysis:    Component Value Date/Time   COLORURINE AMBER (A) 12/17/2020 1840   APPEARANCEUR TURBID (A) 12/17/2020 1840   LABSPEC 1.030 12/17/2020 1840   PHURINE 5.0 12/17/2020 1840   GLUCOSEU 100 (A) 12/17/2020 1840   HGBUR LARGE (A) 12/17/2020 1840   BILIRUBINUR MODERATE (A) 12/17/2020 1840   KETONESUR TRACE (A) 12/17/2020 1840   PROTEINUR 100 (A) 12/17/2020 1840   NITRITE POSITIVE (A) 12/17/2020 1840   LEUKOCYTESUR NEGATIVE 12/17/2020 1840   Sepsis Labs: Invalid input(s): PROCALCITONIN, LACTICIDVEN  Microbiology: Recent Results (from the  past 240 hour(s))  CULTURE, BLOOD (ROUTINE X 2) w Reflex to ID Panel     Status: None   Collection Time: 12/22/20  2:14 PM   Specimen: BLOOD  Result Value Ref  Range Status   Specimen Description BLOOD LEFT ANTECUBITAL  Final   Special Requests   Final    BOTTLES DRAWN AEROBIC AND ANAEROBIC Blood Culture adequate volume   Culture   Final    NO GROWTH 5 DAYS Performed at [facility], [address] [place], [place] [address]    Report Status 12/27/2020 FINAL  Final  CULTURE, BLOOD (ROUTINE X 2) w Reflex to ID Panel     Status: None   Collection Time: 12/22/20  2:15 PM   Specimen: BLOOD  Result Value Ref Range Status   Specimen Description BLOOD RIGHT ANTECUBITAL  Final   Special Requests   Final    BOTTLES DRAWN AEROBIC AND ANAEROBIC Blood Culture adequate volume   Culture   Final    NO GROWTH 5 DAYS Performed at [facility], [address] [place], [place] [address]    Report Status 12/27/2020 FINAL  Final  Acid Fast Smear (AFB)     Status: None   Collection Time: 12/25/20  4:30 PM   Specimen: PATH Cytology Pleural fluid  Result Value Ref Range Status   AFB Specimen Processing Concentration  Final   Acid Fast Smear Negative  Final    Comment: (NOTE) Performed At: [facility] [place], [place] [id number] Rush Farmer MD [id number]    Source (AFB) PLEURAL  Final    Comment: Performed at [facility], [address] [place], [place] [address]  Body fluid culture w Gram Stain     Status: None   Collection Time: 12/25/20  4:30 PM   Specimen: PATH Cytology Pleural fluid  Result Value Ref Range Status   Specimen Description   Final    PLEURAL Performed at [facility], [address] [place], [place] [address]    Special Requests   Final    NONE Performed at [facility], [address] [place], [place] [address]    Gram Stain   Final    RARE WBC PRESENT,BOTH PMN AND MONONUCLEAR NO ORGANISMS SEEN    Culture   Final    NO GROWTH 3 DAYS Performed at [facility], [place] [address], [place], [place] [address]    Report Status 12/29/2020 FINAL   Final  Body fluid culture w Gram Stain     Status: None   Collection Time: 12/25/20  4:49 PM   Specimen: BILE; Body Fluid  Result Value Ref Range Status   Specimen Description   Final    BILE Performed at [facility], [address] [place], [place] [address]    Special Requests   Final    NONE Performed at [facility], [address] [place], [place] [address]    Gram Stain   Final    RARE WBC PRESENT, PREDOMINANTLY MONONUCLEAR NO ORGANISMS SEEN    Culture   Final    NO GROWTH 3 DAYS Performed at [facility], [place] [address], [place], [place] [address]    Report Status 12/29/2020 FINAL  Final    Radiology Studies: No results found.    Traveon Louro T. [place]  If 7PM-7AM, please contact night-coverage [email or web address] 12/29/2020, 4:08 PM

## 2020-12-29 NOTE — Progress Notes (Signed)
Mobility Specialist - Progress Note   12/29/20 1500  Mobility  Activity Refused mobility  Mobility performed by Mobility specialist    Pt lying in bed upon arrival. Pt politely declined mobility this date, no reason specified. "I'm sorry, I don't want to do any of that today." Will attempt session another date/time.    Kathee Delton Mobility Specialist 12/29/20, 3:12 PM

## 2020-12-29 NOTE — Progress Notes (Signed)
Hematology/Oncology Progress Note [facility] [phone] [phone]  Patient Care Team: Earlie Server, MD as PCP - General (Oncology) Telford Nab, RN as Registered Nurse Erby Pian, MD as Referring Physician (Specialist) Teodoro Spray, MD as Consulting Physician (Cardiology) Anthonette Legato, MD as Consulting Physician (Nephrology) Vladimir Crofts, MD as Consulting Physician (Neurology) Meade Maw, MD as Consulting Physician (Neurosurgery)   Name of the patient: Scott Jordan  [id number]  [birth year]  Date of visit: 12/29/20   INTERVAL HISTORY-  S/p cholecystostomy New onset of right upper extremity weakness 12/20/2020 MRI brain w wo contrast - multiple infarcts.  12/21/2020 CT angiogram head and neck showed advanced chronic microvascular ischemic changes of the white matter.  Atherosclerosis disease of the bilateral carotid bifurcation, bilateral vertebral arteries without hemodynamically significant stenosis.  Mild intracranial disease without stenosis. 12/21/2020 echocardiogram showed LVEF 60-65%. No vegetation. 12/21/20 I Have discussed and update patient's power of attorney [name] over the phone.  12/24/20 , I had a phone conversation with POA Tasha and discussed about patient's poor prognosis.  Tasha further discussed with patient and requested PEG tube placement.  12/25/2020 MRI brain postcontrast imaging was done.  There is no abnormal enhancement.  No intracranial metastasis.  12/25/2020, patient has very poor appetite and p.o. intake.  Severe protein calorie malnutrition.  Hypoxia.  Pulmonology is consulted. Patient was seen at bedside.  When being asked about his breathing, he says " a bit shortness of breath". denies any pain.  On nasal cannula oxygen.  S/p thoracentesis, fluid study is pending. Off oxygen.  Refused G tube placement.  Patient feels weak and has no appetite.  Started on calorie count   No Known  Allergies  Patient Active Problem List   Diagnosis Date Noted   Cerebrovascular accident (CVA) due to embolism of precerebral artery ([place])    Palliative care encounter    Cholelithiasis with acute cholecystitis 12/20/2020   Cholecystitis    Septicemia due to Klebsiella pneumoniae ([place]) 12/18/2020   Neutropenic sepsis ([place]) 12/17/2020   Head trauma 06/24/2020   Ulnar neuropathy of left upper extremity 05/13/2020   Cervical radiculopathy 05/13/2020   B12 deficiency 04/06/2020   Weight loss 03/23/2020   Neuropathy of left upper extremity 03/22/2020   Hepatitis B infection without delta agent without hepatic coma 12/30/2019   Chronic hepatitis C without hepatic coma ([place]) 12/30/2019   Chronic anticoagulation 12/23/2019   Elevated LFTs 12/23/2019   Hypokalemia 12/08/2019   Acute respiratory failure with hypoxia ([place]) 11/23/2019   Heroin overdose, accidental or unintentional, initial encounter ([place]) 11/23/2019   Aspiration pneumonia ([place]) 11/23/2019   Pancytopenia ([place]) 11/23/2019   Atypical atrial flutter ([place]) 11/23/2019   Hypomagnesemia 10/21/2019   Antineoplastic chemotherapy induced anemia 10/13/2019   Dehydration 10/13/2019   Thrombocytopenia ([place]) 10/13/2019   Chemotherapy-induced neutropenia ([place]) 10/10/2019   Anemia 09/11/2019   Cancer-related pain 09/05/2019   Encounter for antineoplastic chemotherapy 09/02/2019   Encounter for antineoplastic immunotherapy 09/02/2019   Bone metastasis ([place]) 09/02/2019   Goals of care, counseling/discussion 08/26/2019   Small cell lung cancer ([place]) 08/23/2019   Protein-calorie malnutrition, severe 08/19/2019   Hyponatremia    Dyspnea    Pleural effusion    Recurrent pleural effusion on left    Malignant pleural effusion 08/17/2019   Alcohol abuse 07/21/2019   History of substance abuse ([place]) 06/18/2019   Hypertension, essential 06/18/2019   Malnutrition of moderate degree 01/15/2019   Elevated troponin 01/14/2019     Past  Medical History:  Diagnosis Date   A-fib [facility]) 01/10/2019   pt st this was dx by Dr. Ubaldo Glassing   Cancer [facility]) [id number]   LUNG   Complication of anesthesia    DIFFICULTY WAKING UP AFTER SURGERY- 12 YRS AGO   Hypertension    Lung cancer ([place])    Substance abuse ([place])      Past Surgical History:  Procedure Laterality Date   ARM DEBRIDEMENT Left    INCISION AND DEBRIDEMENT LOWER ARM -22 YRS AGO   BACK SURGERY     IR PERC CHOLECYSTOSTOMY  12/20/2020   PORTACATH PLACEMENT Left 08/29/2019   Procedure: INSERTION PORT-A-CATH;  Surgeon: Nestor Lewandowsky, MD;  Location: [facility] ORS;  Service: General;  Laterality: Left;    Social History   Socioeconomic History   Marital status: Single    Spouse name: Not on file   Number of children: Not on file   Years of education: Not on file   Highest education level: Not on file  Occupational History   Not on file  Tobacco Use   Smoking status: Former    Packs/day: 0.50    Types: Cigarettes   Smokeless tobacco: Never   Tobacco comments:    not smoked since 6 weeks ago  Vaping Use   Vaping Use: Never used  Substance and Sexual Activity   Alcohol use: Yes    Comment: 64 OZ IN WEEK   Drug use: Not Currently    Types: Heroin    Comment: heroin WITHIN PAST YEAR   Sexual activity: Not on file  Other Topics Concern   Not on file  Social History Narrative   Not on file   Social Determinants of Health   Financial Resource Strain: Not on file  Food Insecurity: Not on file  Transportation Needs: Not on file  Physical Activity: Not on file  Stress: Not on file  Social Connections: Not on file  Intimate Partner Violence: Not on file     History reviewed. No pertinent family history.   Current Facility-Administered Medications:    0.9 %  sodium chloride infusion (Manually program via Guardrails IV Fluids), , Intravenous, Once, Sharen Hones, MD   0.9 %  sodium chloride infusion, , Intravenous, PRN, Sharen Hones, MD, Stopped at 12/25/20 1558   0.9 %   sodium chloride infusion, , , Continuous PRN, Criselda Peaches, MD, Last Rate: 10 mL/hr at 12/20/20 1220, 10 mL/hr at 12/20/20 1220   acetaminophen (TYLENOL) tablet 650 mg, 650 mg, Oral, Q6H PRN **OR** acetaminophen (TYLENOL) suppository 650 mg, 650 mg, Rectal, Q6H PRN, Cox, Amy N, DO   albuterol (PROVENTIL) (2.5 MG/3ML) 0.083% nebulizer solution 2.5 mg, 2.5 mg, Nebulization, Q6H PRN, Lanney Gins, Fuad, MD   chlorhexidine (PERIDEX) 0.12 % solution 10 mL, 10 mL, Mouth/Throat, BID, Earlie Server, MD, 10 mL at 12/28/20 2231   Chlorhexidine Gluconate Cloth 2 % PADS 6 each, 6 each, Topical, Daily, Cox, Amy N, DO, 6 each at 12/28/20 1502   diltiazem (CARDIZEM CD) 24 hr capsule 240 mg, 240 mg, Oral, Daily, Cox, Amy N, DO, 240 mg at 12/28/20 1500   diltiazem (CARDIZEM) tablet 30 mg, 30 mg, Oral, Q6H PRN, Cyndia Skeeters, Taye T, MD   dronabinol (MARINOL) capsule 5 mg, 5 mg, Oral, BID AC, Earlie Server, MD   folic acid (FOLVITE) tablet 1 mg, 1 mg, Oral, Daily, Roosevelt Locks, Dekui, MD, 1 mg at 12/28/20 1501   furosemide (LASIX) tablet 20 mg, 20 mg, Oral, Daily, Cyndia Skeeters, Charlesetta Ivory, MD, 20  mg at 12/28/20 1502   guaiFENesin (MUCINEX) 12 hr tablet 1,200 mg, 1,200 mg, Oral, BID, Dorothe Pea, RPH, 1,200 mg at 12/28/20 2231   heparin ADULT infusion 100 units/mL (25000 units/210mL), 2,000 Units/hr, Intravenous, Continuous, Dallie Piles, RPH, Last Rate: 20 mL/hr at 12/29/20 1022, 2,000 Units/hr at 12/29/20 1022   mirtazapine (REMERON) tablet 15 mg, 15 mg, Oral, QHS, Earlie Server, MD   multivitamin with minerals tablet 1 tablet, 1 tablet, Oral, Daily, Sharen Hones, MD, 1 tablet at 12/28/20 1502   ondansetron (ZOFRAN) tablet 4 mg, 4 mg, Oral, Q6H PRN **OR** ondansetron (ZOFRAN) injection 4 mg, 4 mg, Intravenous, Q6H PRN, Cox, Amy N, DO   oxyCODONE-acetaminophen (PERCOCET/ROXICET) 5-325 MG per tablet 1 tablet, 1 tablet, Oral, Q6H PRN, Mansy, Jan A, MD, 1 tablet at 12/24/20 0944   sodium chloride flush (NS) 0.9 % injection 10-40 mL, 10-40 mL,  Intracatheter, Q12H, Manuella Ghazi, Vipul, MD, 10 mL at 12/28/20 2232   sodium chloride flush (NS) 0.9 % injection 10-40 mL, 10-40 mL, Intracatheter, PRN, Manuella Ghazi, Vipul, MD   sodium chloride flush (NS) 0.9 % injection 5 mL, 5 mL, Intracatheter, Q8H, Criselda Peaches, MD, 5 mL at 12/29/20 9323   thiamine tablet 100 mg, 100 mg, Oral, Daily, Sharen Hones, MD, 100 mg at 12/28/20 1501  Facility-Administered Medications Ordered in Other Encounters:    sodium chloride flush (NS) 0.9 % injection 10 mL, 10 mL, Intravenous, PRN, Nolon Stalls C, MD, 10 mL at 11/11/19 0812   Physical exam:  Vitals:   12/28/20 1526 12/28/20 2100 12/29/20 0500 12/29/20 0809  BP: (!) 144/99 133/87 118/79 (!) 122/93  Pulse: 97  81 (!) 107  Resp: 18 18  20   Temp: 97.9 F (36.6 C) 97.8 F (36.6 C) 97.6 F (36.4 C) 97.8 F (36.6 C)  TempSrc: Oral Oral Oral Oral  SpO2: 95% 98% 98% 97%  Weight:      Height:       Physical Exam Constitutional:      General: He is not in acute distress.    Appearance: He is not diaphoretic.  HENT:     Head: Normocephalic and atraumatic.     Nose: Nose normal.     Mouth/Throat:     Pharynx: No oropharyngeal exudate.  Eyes:     General: No scleral icterus.    Pupils: Pupils are equal, round, and reactive to light.  Cardiovascular:     Rate and Rhythm: Normal rate and regular rhythm.     Heart sounds: No murmur heard. Pulmonary:     Effort: Pulmonary effort is normal. No respiratory distress.     Breath sounds: No rales.  Chest:     Chest wall: No tenderness.  Abdominal:     General: There is no distension.     Palpations: Abdomen is soft.     Tenderness: There is no abdominal tenderness.     Comments: cholecystostomy tube  Musculoskeletal:        General: Normal range of motion.     Cervical back: Normal range of motion and neck supple.  Skin:    General: Skin is warm and dry.     Findings: No erythema.  Neurological:     Mental Status: He is alert. Mental status is at  baseline.     Cranial Nerves: No cranial nerve deficit.     Motor: No abnormal muscle tone.     Comments: Decreased right upper extremity strength.  3-4/5  Psychiatric:  Mood and Affect: Affect normal.     Comments: depressed       CMP Latest Ref Rng & Units 12/28/2020  Glucose 70 - 99 mg/dL 93  BUN 8 - 23 mg/dL 10  Creatinine 0.61 - 1.24 mg/dL 0.62  Sodium 135 - 145 mmol/L 136  Potassium 3.5 - 5.1 mmol/L 3.8  Chloride 98 - 111 mmol/L 108  CO2 22 - 32 mmol/L 22  Calcium 8.9 - 10.3 mg/dL 7.6(L)  Total Protein 6.5 - 8.1 g/dL -  Total Bilirubin 0.3 - 1.2 mg/dL -  Alkaline Phos 38 - 126 U/L -  AST 15 - 41 U/L -  ALT 0 - 44 U/L -   CBC Latest Ref Rng & Units 12/29/2020  WBC 4.0 - 10.5 K/uL 14.7(H)  Hemoglobin 13.0 - 17.0 g/dL 8.4(L)  Hematocrit 39.0 - 52.0 % 24.0(L)  Platelets 150 - 400 K/uL 508(H)    RADIOGRAPHIC STUDIES: I have personally reviewed the radiological images as listed and agreed with the findings in the report. CT ABDOMEN PELVIS WO CONTRAST  Result Date: 12/17/2020 CLINICAL DATA:  Nausea and vomiting EXAM: CT ABDOMEN AND PELVIS WITHOUT CONTRAST TECHNIQUE: Multidetector CT imaging of the abdomen and pelvis was performed following the standard protocol without IV contrast. COMPARISON:  CT 06/23/2020, PET CT 11/19/2020 FINDINGS: Lower chest: Lung bases demonstrate emphysema. Subpleural bandlike airspace disease at the left lower lobe redemonstrated. Incompletely visualized subpleural peripheral left upper lobe nodule measuring 11 mm, series 5, image 1. Cardiomegaly with trace pericardial effusion. Coronary vascular calcification. Hepatobiliary: Extensive motion degradation. No focal hepatic abnormality. Gallstones. Gallbladder slightly distended. Possible wall thickening or pericholecystic fluid though limited by motion Pancreas: Unremarkable. No pancreatic ductal dilatation or surrounding inflammatory changes. Spleen: Normal in size without focal abnormality.  Adrenals/Urinary Tract: Thickened appearance of adrenal glands. No hydronephrosis. The bladder is unremarkable Stomach/Bowel: Stomach nonenlarged. No dilated small bowel. Negative appendix. No acute bowel wall thickening Vascular/Lymphatic: Advanced aortic atherosclerosis. No aneurysm. Slightly increased multiple small retroperitoneal lymph nodes. Reproductive: Prostate is unremarkable. Other: Negative for free air or free fluid Musculoskeletal: No acute osseous abnormality. IMPRESSION: 1. Degraded by motion. 2. Gallbladder appears slightly distended and there are gallstones. Possible gallbladder wall thickening or pericholecystic fluid though limited by motion, consider correlation with ultrasound 3. Cardiomegaly with trace pericardial effusion. 4. Similar subpleural bandlike density at left lower lobe. Incompletely visualized peripheral left upper lobe lung nodule characterized on recent PET CT Electronically Signed   By: Donavan Foil M.D.   On: 12/17/2020 16:50   CT ANGIO HEAD NECK W WO CM  Result Date: 12/21/2020 CLINICAL DATA:  Neuro deficit, acute, stroke suspected. Neutropenic sepsis. EXAM: CT ANGIOGRAPHY HEAD AND NECK TECHNIQUE: Multidetector CT imaging of the head and neck was performed using the standard protocol during bolus administration of intravenous contrast. Multiplanar CT image reconstructions and MIPs were obtained to evaluate the vascular anatomy. Carotid stenosis measurements (when applicable) are obtained utilizing NASCET criteria, using the distal internal carotid diameter as the denominator. CONTRAST:  66mL OMNIPAQUE IOHEXOL 350 MG/ML SOLN COMPARISON:  MRI of the brain December 20, 2020. FINDINGS: CT HEAD FINDINGS Brain: Confluent hypodensity of the white matter of the cerebral hemispheres, nonspecific, most likely related to chronic small vessel ischemia. Foci of acute infarct are more conspicuous on recent MRI of the brain. No new large territorial infarct. No hemorrhage,  hydrocephalus, mass or midline shift. Vascular: No hyperdense vessel or unexpected calcification. Skull: Normal. Negative for fracture or focal lesion. Sinuses: Increased thickness of  the walls of the bilateral maxillary sinuses with bubbly secretion on the left. Findings are suggestive of chronic sinusitis. Right mastoid effusion. Orbits: No acute finding. Review of the MIP images confirms the above findings CTA NECK FINDINGS Aortic arch: Standard branching. Imaged portion shows no evidence of aneurysm or dissection. Mild atherosclerotic changes of the aortic arch without significant stenosis of the major arch vessel origins. Right carotid system: Calcified atherosclerotic disease of the right carotid bifurcation without hemodynamically significant stenosis. Left carotid system: Atherosclerotic disease of the left carotid bifurcation with mixed density plaques without hemodynamically significant stenosis. Vertebral arteries: Calcified atherosclerotic plaque at the origin of the right vertebral artery without hemodynamically significant stenosis. Mild atherosclerotic changes at the origin of the left vertebral artery without hemodynamically significant stenosis. Cervical vertebral arteries otherwise have normal caliber Skeleton: Degenerative changes of the cervical spine with fusion of the facet joints at C2-3 on the left. No acute findings. Other neck: Negative. Upper chest: Left-sided pleural effusion and atelectasis. Mediastinal lymphadenopathy. Review of the MIP images confirms the above findings CTA HEAD FINDINGS Anterior circulation: Calcified atherosclerotic plaques in the bilateral carotid siphons. No significant stenosis, proximal occlusion, aneurysm, or vascular malformation. Posterior circulation: No significant stenosis, proximal occlusion, aneurysm, or vascular malformation. Hypoplastic/aplastic right P1/PCA segment with right fetal PCA. Venous sinuses: As permitted by contrast timing, patent. Anatomic  variants: Right fetal PCA. Review of the MIP images confirms the above findings IMPRESSION: 1. Advanced chronic microvascular ischemic changes of the white matter. 2. Atherosclerotic disease of the bilateral carotid bifurcation without hemodynamically significant stenosis. 3. Mild atherosclerotic changes at the origin of the bilateral vertebral arteries without hemodynamically significant stenosis. 4. Mild intracranial atherosclerotic disease without hemodynamically significant stenosis. Electronically Signed   By: Pedro Earls M.D.   On: 12/21/2020 15:32   DG Chest 1 View  Result Date: 12/25/2020 CLINICAL DATA:  Thoracentesis.  Lung cancer EXAM: CHEST  1 VIEW COMPARISON:  Radiograph 12/25/2020 FINDINGS: Interval reduction in LEFT pleural fluid following thoracentesis. No pneumothorax appreciated. A small basilar effusion remains. RIGHT lung clear. Port in the anterior chest wall with tip in distal SVC. IMPRESSION: 1. No pneumothorax appreciated following LEFT thoracentesis. 2. Considerable reduction in LEFT pleural fluid. Electronically Signed   By: Suzy Bouchard M.D.   On: 12/25/2020 17:28   DG Chest 2 View  Result Date: 12/27/2020 CLINICAL DATA:  Status post thoracentesis 2 days prior. EXAM: CHEST - 2 VIEW COMPARISON:  Radiograph same day (12/27/2020 at 830 hours FINDINGS: Persistent near complete opacification of the LEFT hemithorax. Small amount of aerated lung remains at LEFT lung apex. Port in place. RIGHT lung clear. IMPRESSION: No interval change in near complete opacification LEFT hemithorax. Electronically Signed   By: Suzy Bouchard M.D.   On: 12/27/2020 18:17   CT HEAD WO CONTRAST (5MM)  Result Date: 12/17/2020 CLINICAL DATA:  Nausea vomiting EXAM: CT HEAD WITHOUT CONTRAST TECHNIQUE: Contiguous axial images were obtained from the base of the skull through the vertex without intravenous contrast. COMPARISON:  CT brain 06/24/2020, MRI 03/10/2020, head CT 11/22/2019  FINDINGS: Brain: No acute territorial infarction, hemorrhage or intracranial mass. Extensive white matter disease, progressed from more remote exams from 2021. Stable ventricle size. Chronic lacunar infarcts within the left white matter. Vascular: No hyperdense vessel.  Carotid vascular calcification Skull: Normal. Negative for fracture or focal lesion. Sinuses/Orbits: Mucosal thickening the sinuses. Chronic appearing nasal deformity Other: None IMPRESSION: 1. No definite CT evidence for acute intracranial abnormality. 2. Atrophy. Extensive white  matter disease, grossly stable as compared with recent head CT from March, appears progressive when compared to exams from 2021 and prior Electronically Signed   By: Donavan Foil M.D.   On: 12/17/2020 16:39   MR BRAIN W WO CONTRAST  Addendum Date: 12/21/2020   ADDENDUM REPORT: 12/21/2020 14:49 ADDENDUM: Patient returns for repeat postcontrast imaging. There is no abnormal enhancement. No evidence of intracranial metastatic disease. Electronically Signed   By: Macy Mis M.D.   On: 12/21/2020 14:49   Result Date: 12/21/2020 CLINICAL DATA:  Neuro deficit, acute, stroke suspected Small cell lung cancer, monitor EXAM: MRI HEAD WITHOUT AND WITH CONTRAST TECHNIQUE: Multiplanar, multiecho pulse sequences of the brain and surrounding structures were obtained without and with intravenous contrast. CONTRAST:  7.4mL GADAVIST GADOBUTROL 1 MMOL/ML IV SOLN COMPARISON:  CT head 12/17/2020. FINDINGS: Mildly motion limited exam.  Within this limitation: Brain: Multiple small areas of restricted diffusion in the bilateral frontal and occipital, and left parietal matter, left perirolandic cortex, left thalamocapsular region, and left cerebellum. Advanced confluent T2 hyperintensity within the white matter, nonspecific but compatible with chronic microvascular ischemic disease and progressed since 2021. Small remote infarct in the left corona radiata. No hydrocephalus, obvious mass  lesion, midline shift, acute hemorrhage, or extra-axial fluid collection. Punctate foci of susceptibility artifact in the left cerebellum and left frontal white matter, nonspecific but potentially related to prior microhemorrhage. Contrast was administered; however, there is no evidence of enhancement (for example, the nasal mucosa/turbinates are nonenhancing). This precludes evaluation for enhancing lesions. Vascular: Major arterial flow voids are maintained at the skull base. Skull and upper cervical spine: Normal marrow signal. Sinuses/Orbits: Mild paranasal sinus disease. Other: Moderate right and small left mastoid effusions. IMPRESSION: 1. Multiple small areas of restricted diffusion in the bilateral frontal and occipital and left parietal white matter, left perirolandic cortex, left thalamocapsular region, and left cerebellum, which are concerning for acute infarcts. Given involvement of multiple vascular territories, consider a central embolic etiology. 2. Contrast was administered; however, there is no evidence of enhancement, unclear why given reported uneventful injection per the technologist. This precludes evaluation for enhancing lesions. Given the patient's known small cell lung cancer diagnosis, recommend repeat postcontrast imaging to exclude metastatic disease. I've asked the MRI technologist to not bill the patient for the repeat post-contrast imaging. 3. Advanced chronic microvascular ischemic disease, progressed since 2021. 4. Moderate right and small left mastoid effusions. Electronically Signed: By: Margaretha Sheffield M.D. On: 12/20/2020 14:29   IR Perc Cholecystostomy  Result Date: 12/20/2020 INDICATION: 70 year old male with small cell lung cancer and chemotherapy induced pancytopenia presenting with neutropenic sepsis. Blood cultures grew out Klebsiella and imaging findings are concerning for acute cholecystitis. He is too sick and frail to be considered an operative candidate. Following  transfusion of platelets, his platelets are now at an acceptable level to proceed with percutaneous cholecystostomy tube placement. EXAM: CHOLECYSTOSTOMY MEDICATIONS: In patient currently receiving intravenous antibiotic therapy. No additional antibiotic prophylaxis administered. ANESTHESIA/SEDATION: Moderate (conscious) sedation was employed during this procedure. A total of Versed 1 mg and Fentanyl 50 mcg and 0.5 mg Dilaudid administered intravenously. Moderate Sedation Time: 13 minutes. The patient's level of consciousness and vital signs were monitored continuously by radiology nursing throughout the procedure under my direct supervision. FLUOROSCOPY TIME:  Fluoroscopy Time: 0 minutes 30 seconds (4.3 mGy). COMPLICATIONS: None immediate. PROCEDURE: Informed written consent was obtained from the patient after a thorough discussion of the procedural risks, benefits and alternatives. All questions were addressed. Maximal  Sterile Barrier Technique was utilized including caps, mask, sterile gowns, sterile gloves, sterile drape, hand hygiene and skin antiseptic. A timeout was performed prior to the initiation of the procedure. The right upper quadrant was interrogated with ultrasound. The distended gallbladder with a thickened wall was successfully identified. There is significant excursion of the liver with respiration due to use of abdominal muscles. A suitable skin entry site was selected. Local anesthesia was attained by infiltration with 1% lidocaine. A small dermatotomy was made. Under real-time ultrasound guidance, the movement of the liver was timed with respirations and in a single pass, a 21 gauge Accustick needle was advanced through the liver and into the gallbladder lumen along a short transhepatic course. A 0.018 wire was quickly advanced into the gallbladder lumen and the needle was exchanged for the transitional dilator. The transitional dilator was advanced into the gallbladder lumen. The wire was  removed. Contrast injection was performed under fluoroscopy confirming opacification of the gallbladder lumen. Multiple filling defects consistent with cholelithiasis. The cystic duct is occluded. A 0.035 wire was then coiled within the gallbladder lumen. The transitional dilator was removed. The transhepatic tract was dilated to 10 [place] with a stiff dilator. A Cook 10.2 [place] all-purpose drainage catheter was then advanced over the wire and formed in the gallbladder lumen. Images were obtained and stored for the medical record. The catheter was gently flushed and connected to gravity bag drainage before being secured to the skin with 0 silk suture. IMPRESSION: Successful placement of 10 French transhepatic percutaneous cholecystostomy tube for an indication of calculus cholecystitis. PLAN: Maintain tube to gravity bag drainage. Patient will likely need home health to assist with care and cleaning of the tube site. Return to interventional radiology approximately every 8 weeks for cholecystostomy tube check and exchange. If patient's clinical status improves in the future, elective cholecystectomy would be optimal. If patient remains a poor operative candidate, long-term cholecystostomy tube maintenance will likely be required. Electronically Signed   By: Jacqulynn Cadet M.D.   On: 12/20/2020 13:50   DG Chest Port 1 View  Result Date: 12/27/2020 CLINICAL DATA:  Shortness of breath with atelectasis common thoracentesis 2 days ago EXAM: PORTABLE CHEST 1 VIEW COMPARISON:  Chest radiograph 12/25/2020 FINDINGS: The left chest wall port is in stable position with the tip terminating in the region of the right atrium. The cardiomediastinal silhouette can not be assessed. The left pleural effusion has increased in size with worsening aeration of the left upper lobe. Aeration of the right lung is not significantly changed. There is no significant right effusion. There is no appreciable pneumothorax. There is no  acute osseous abnormality. Enteric contrast is noted in the colon. IMPRESSION: Increased size of the large left pleural effusion with worsened aeration of the left lung. Electronically Signed   By: Valetta Mole M.D.   On: 12/27/2020 09:12   DG Chest Port 1 View  Result Date: 12/25/2020 CLINICAL DATA:  Shortness of breath EXAM: PORTABLE CHEST 1 VIEW COMPARISON:  Chest x-ray 12/17/2020, PET CT 11/19/2020 FINDINGS: Left-sided central venous port tip over right atrium. Interval complete opacification of left thorax, suspect that there is at least moderate pleural effusion. Cardiomediastinal silhouette is obscured. No pneumothorax is visualized. IMPRESSION: Interval opacification of the entire left thorax which may be due to combination of pleural effusion/atelectasis/airspace disease. Electronically Signed   By: Donavan Foil M.D.   On: 12/25/2020 15:19   DG Chest Port 1 View  Result Date: 12/17/2020 CLINICAL DATA:  Questionable  sepsis.  Weakness EXAM: PORTABLE CHEST 1 VIEW COMPARISON:  03/09/2020 FINDINGS: Left subclavian approached chest port remains in place. Stable cardiomegaly. Chronic left basilar scarring or atelectasis. Peripheral left upper lobe nodule better seen on prior CT. No new airspace consolidation. Pleural effusion or pneumothorax. IMPRESSION: Chronic left basilar scarring or atelectasis. No new or acute findings. Electronically Signed   By: Davina Poke D.O.   On: 12/17/2020 13:20   DG Swallowing Func-Speech Pathology  Result Date: 12/25/2020 Table formatting from the original result was not included. Objective Swallowing Evaluation: Type of Study: MBS-Modified Barium Swallow Study  Patient Details Name: ZACKARIE CHASON MRN: [id number] Date of Birth: February 11, 1951 Today's Date: 12/25/2020 Time: SLP Start Time (ACUTE ONLY): 36 -SLP Stop Time (ACUTE ONLY): 1300 SLP Time Calculation (min) (ACUTE ONLY): 30 min Past Medical History: Past Medical History: Diagnosis Date  A-fib ([place]) 01/10/2019  pt st  this was dx by Dr. Ubaldo Glassing  Cancer [facility]) [id number]  LUNG  Complication of anesthesia   DIFFICULTY WAKING UP AFTER SURGERY- 55 YRS AGO  Hypertension   Lung cancer ([place])   Substance abuse ([place])  Past Surgical History: Past Surgical History: Procedure Laterality Date  ARM DEBRIDEMENT Left   INCISION AND DEBRIDEMENT LOWER ARM -20 YRS AGO  BACK SURGERY    IR PERC CHOLECYSTOSTOMY  12/20/2020  PORTACATH PLACEMENT Left 08/29/2019  Procedure: INSERTION PORT-A-CATH;  Surgeon: Nestor Lewandowsky, MD;  Location: [facility] ORS;  Service: General;  Laterality: Left; HPI: Pt is a 70 year old male with PMH of 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 fib not on AC, chronic hep C, chronic cancer pain, HTN, alcohol abuse, malnutrition and polysubstance who is admitted with neutropenic sepsis due to acute cholecystitis and Klebsiella bacteremia now s/p cholecystostomy on 9/11 by IR. Hospital admission complicated by acute thromboembolic stroke with new right-sided weakness-confirmed on MRI brain on 12/20/2020.  MRI: Multiple small areas of restricted diffusion in the bilateral  frontal and occipital and left parietal white matter, left  perirolandic cortex, left thalamocapsular region, and left  cerebellum, which are concerning for acute infarcts.  CXR at admit: Chronic left basilar scarring or atelectasis. No new or acute  findings.  Pt is followed by Palliative Care at [facility].  Subjective: pt able to communicate his wants/needs Assessment / Plan / Recommendation CHL IP CLINICAL IMPRESSIONS 12/25/2020 Clinical Impression Pt presents with adequate airway protection while consuming thin liquids, nectar thick liquids, puree and whole barium tablet with thin liquids. Pt's oral phase is unremarkable and his swallow response is timely with good airway protection. His overall medical comorbidities and respiratory decline place him at an overall increased risk of aspiration. For example, when consuming consecutive  sips of thin liquids, pt became short of breath. At this time, recommend pt continue consuming dysphagia 3 diet for energy conservation with thin liquids, medicine whole with thin liquids. Pt needs to follow strict aspiration precautions such as taking single sips and rest breaks. SLP Visit Diagnosis Dysphagia, oropharyngeal phase (R13.12) Attention and concentration deficit following -- Frontal lobe and executive function deficit following -- Impact on safety and function Mild aspiration risk;Moderate aspiration risk   CHL IP TREATMENT RECOMMENDATION 12/25/2020 Treatment Recommendations No treatment recommended at this time   Prognosis 12/24/2020 Prognosis for Safe Diet Advancement Fair Barriers to Reach Goals Motivation;Time post onset;Severity of deficits;Behavior Barriers/Prognosis Comment -- CHL IP DIET RECOMMENDATION 12/25/2020 SLP Diet Recommendations Dysphagia 3 (Mech soft) solids;Thin liquid Liquid Administration via Cup Medication Administration Whole  meds with liquid Compensations Minimize environmental distractions;Slow rate;Small sips/bites Postural Changes Remain semi-upright after after feeds/meals (Comment);Seated upright at 90 degrees   CHL IP OTHER RECOMMENDATIONS 12/25/2020 Recommended Consults -- Oral Care Recommendations Oral care BID Other Recommendations --   CHL IP FOLLOW UP RECOMMENDATIONS 12/25/2020 Follow up Recommendations None   CHL IP FREQUENCY AND DURATION 12/24/2020 Speech Therapy Frequency (ACUTE ONLY) min 3x week Treatment Duration 2 weeks      CHL IP ORAL PHASE 12/25/2020 Oral Phase WFL Oral - Pudding Teaspoon -- Oral - Pudding Cup -- Oral - Honey Teaspoon -- Oral - Honey Cup -- Oral - Nectar Teaspoon -- Oral - Nectar Cup -- Oral - Nectar Straw -- Oral - Thin Teaspoon -- Oral - Thin Cup -- Oral - Thin Straw -- Oral - Puree -- Oral - Mech Soft -- Oral - Regular -- Oral - Multi-Consistency -- Oral - Pill -- Oral Phase - Comment --  CHL IP PHARYNGEAL PHASE 12/25/2020 Pharyngeal Phase WFL  Pharyngeal- Pudding Teaspoon -- Pharyngeal -- Pharyngeal- Pudding Cup -- Pharyngeal -- Pharyngeal- Honey Teaspoon -- Pharyngeal -- Pharyngeal- Honey Cup -- Pharyngeal -- Pharyngeal- Nectar Teaspoon -- Pharyngeal -- Pharyngeal- Nectar Cup -- Pharyngeal -- Pharyngeal- Nectar Straw -- Pharyngeal -- Pharyngeal- Thin Teaspoon -- Pharyngeal -- Pharyngeal- Thin Cup -- Pharyngeal -- Pharyngeal- Thin Straw -- Pharyngeal -- Pharyngeal- Puree -- Pharyngeal -- Pharyngeal- Mechanical Soft -- Pharyngeal -- Pharyngeal- Regular -- Pharyngeal -- Pharyngeal- Multi-consistency -- Pharyngeal -- Pharyngeal- Pill -- Pharyngeal -- Pharyngeal Comment --  CHL IP CERVICAL ESOPHAGEAL PHASE 12/25/2020 Cervical Esophageal Phase WFL Pudding Teaspoon -- Pudding Cup -- Honey Teaspoon -- Honey Cup -- Nectar Teaspoon -- Nectar Cup -- Nectar Straw -- Thin Teaspoon -- Thin Cup -- Thin Straw -- Puree -- Mechanical Soft -- Regular -- Multi-consistency -- Pill -- Cervical Esophageal Comment -- Happi B. [name] M.S., CCC-SLP, [place] Office [phone] Stormy Fabian 12/25/2020, 2:17 PM              ECHOCARDIOGRAM COMPLETE  Result Date: 12/21/2020    ECHOCARDIOGRAM REPORT   Patient Name:   KIN GALBRAITH Date of Exam: 12/21/2020 Medical Rec #:  [id number]    Height:       72.0 in Accession #:    [id number]   Weight:       137.1 lb Date of Birth:  Mar 14, 1951     BSA:          1.815 m Patient Age:    45 years     BP:           114/80 mmHg Patient Gender: M            HR:           89 bpm. Exam Location:  [facility] Procedure: 2D Echo, Color Doppler and Cardiac Doppler Indications:     Endocarditis I38  History:         Patient has prior history of Echocardiogram examinations, most                  recent 08/19/2019. Arrythmias:Atrial Fibrillation; Risk                  Factors:Hypertension. Substance abuse.  Sonographer:     Sherrie Sport Referring Phys:  [place] Diagnosing Phys: Serafina Royals MD IMPRESSIONS   1. Left ventricular ejection fraction, by estimation, is 60 to 65%. The left ventricle has normal function. The left ventricle has no regional  wall motion abnormalities. Left ventricular diastolic parameters were normal.  2. Right ventricular systolic function is normal. The right ventricular size is normal.  3. The mitral valve is normal in structure. Trivial mitral valve regurgitation.  4. The aortic valve is normal in structure. Aortic valve regurgitation is not visualized. Mild aortic valve sclerosis is present, with no evidence of aortic valve stenosis. Conclusion(s)/Recommendation(s): No evidence of valve vegetations. FINDINGS  Left Ventricle: Left ventricular ejection fraction, by estimation, is 60 to 65%. The left ventricle has normal function. The left ventricle has no regional wall motion abnormalities. The left ventricular internal cavity size was small. There is no left ventricular hypertrophy. Left ventricular diastolic parameters were normal. Right Ventricle: The right ventricular size is normal. No increase in right ventricular wall thickness. Right ventricular systolic function is normal. Left Atrium: Left atrial size was normal in size. Right Atrium: Right atrial size was normal in size. Pericardium: There is no evidence of pericardial effusion. Mitral Valve: The mitral valve is normal in structure. Trivial mitral valve regurgitation. Tricuspid Valve: The tricuspid valve is normal in structure. Tricuspid valve regurgitation is trivial. Aortic Valve: The aortic valve is normal in structure. Aortic valve regurgitation is not visualized. Mild aortic valve sclerosis is present, with no evidence of aortic valve stenosis. Aortic valve mean gradient measures 3.0 mmHg. Aortic valve peak gradient measures 5.9 mmHg. Aortic valve area, by VTI measures 2.30 cm. Pulmonic Valve: The pulmonic valve was normal in structure. Pulmonic valve regurgitation is not visualized. Aorta: The aortic root and ascending aorta  are structurally normal, with no evidence of dilitation. IAS/Shunts: No atrial level shunt detected by color flow Doppler.  LEFT VENTRICLE PLAX 2D LVIDd:         3.87 cm LVIDs:         2.30 cm LV PW:         1.00 cm LV IVS:        1.18 cm LVOT diam:     2.10 cm LV SV:         44 LV SV Index:   24 LVOT Area:     3.46 cm  RIGHT VENTRICLE RV Basal diam:  3.70 cm RV S prime:     12.40 cm/s TAPSE (M-mode): 3.7 cm LEFT ATRIUM           Index       RIGHT ATRIUM           Index LA diam:      3.50 cm 1.93 cm/m  RA Area:     33.70 cm LA Vol (A2C): 57.4 ml 31.63 ml/m RA Volume:   128.00 ml 70.53 ml/m LA Vol (A4C): 72.1 ml 39.73 ml/m  AORTIC VALVE                   PULMONIC VALVE AV Area (Vmax):    2.34 cm    PV Vmax:        0.60 m/s AV Area (Vmean):   2.32 cm    PV Peak grad:   1.4 mmHg AV Area (VTI):     2.30 cm    RVOT Peak grad: 2 mmHg AV Vmax:           121.00 cm/s AV Vmean:          86.000 cm/s AV VTI:            0.193 m AV Peak Grad:      5.9 mmHg AV Mean Grad:  3.0 mmHg LVOT Vmax:         81.60 cm/s LVOT Vmean:        57.600 cm/s LVOT VTI:          0.128 m LVOT/AV VTI ratio: 0.66  AORTA Ao Root diam: 3.50 cm MITRAL VALVE               TRICUSPID VALVE MV Area (PHT): 4.52 cm    TR Peak grad:   24.4 mmHg MV Decel Time: 168 msec    TR Vmax:        247.00 cm/s MV E velocity: 88.30 cm/s                            SHUNTS                            Systemic VTI:  0.13 m                            Systemic Diam: 2.10 cm Serafina Royals MD Electronically signed by Serafina Royals MD Signature Date/Time: 12/21/2020/5:20:09 PM    Final    US Abdomen Limited RUQ (LIVER/GB)  Result Date: 12/18/2020 CLINICAL DATA:  Evaluate for cholecystitis. EXAM: ULTRASOUND ABDOMEN LIMITED RIGHT UPPER QUADRANT COMPARISON:  CT AP from 12/17/2020 FINDINGS: Gallbladder: The gallbladder wall is diffusely edematous measuring up to 13.1 mm. Gallstones are noted measuring up to 5.9 mm. Pericholecystic fluid. Positive sonographic Murphy's  sign. Common bile duct: Diameter: 6 mm Liver: Diffusely increased parenchymal echogenicity identified. No focal liver abnormality. Portal vein is patent on color Doppler imaging with normal direction of blood flow towards the liver. Other: 1.9 cm right kidney cyst. IMPRESSION: Gallstones, gallbladder wall thickening and pericholecystic fluid with positive sonographic Murphy's sign. Findings are compatible with acute cholecystitis. Electronically Signed   By: Kerby Moors M.D.   On: 12/18/2020 14:18   US THORACENTESIS ASP PLEURAL SPACE W/IMG GUIDE  Result Date: 12/25/2020 INDICATION: Large left pleural effusion secondary to known lung cancer. Request for diagnostic and therapeutic thoracentesis. EXAM: ULTRASOUND GUIDED LEFT THORACENTESIS MEDICATIONS: 1% lidocaine 10 mL COMPLICATIONS: None immediate. PROCEDURE: An ultrasound guided thoracentesis was thoroughly discussed with the patient and questions answered. The benefits, risks, alternatives and complications were also discussed. The patient understands and wishes to proceed with the procedure. Written consent was obtained. Ultrasound was performed to localize and mark an adequate pocket of fluid in the left chest. The area was then prepped and draped in the normal sterile fashion. 1% Lidocaine was used for local anesthesia. Under ultrasound guidance a 6 Fr Safe-T-Centesis catheter was introduced. Thoracentesis was performed. The catheter was removed and a dressing applied. FINDINGS: A total of approximately 700 mL of hazy yellow fluid was removed. Samples were sent to the laboratory as requested by the clinical team. IMPRESSION: Successful ultrasound guided left thoracentesis yielding 700 mL of pleural fluid. Read by: Gareth Eagle, PA-C Electronically Signed   By: Michaelle Birks M.D.   On: 12/25/2020 16:44    Assessment and plan-   #Neutropenic sepsis secondary to Klebsiella pneumonia bacteremia Due to acute cholecystitis s/p cholecystostomy Surgery does  not plan inpatient cholecystectomy- outpt follow up  ID recommends total 10 days of antibiotics. Completed.    #Acute embolic stroke Echocardiogram showed no vegetation.   Cardiology recommend to defer TEE  Atrial fibrillation, resumed on Eliquis 5  mg twice daily. Currently on heparin in light of pleurx placement.   # Left pleural effusion, s/p diagnostic and therapeutic thoracentesis.   # Extensive small cell lung caner,  S/p 1 cycle of carboplatin/etoposide/Tecentriq.   Poor prognosis.   # Decreased oral intake, deconditioning, protein calorie malnutrition.  Increase Marinol to 5mg  BID. Refuses G tube.  Calorie count.  # Depression, add Remeron 15mg  at bedtime.  # Anemia, check B12 retic panel   PT/OT  Code Status DNR  Thank you for allowing me to participate in the care of this patient.   Earlie Server, MD, PhD Hematology Oncology [place] at [facility]  12/29/2020

## 2020-12-29 NOTE — Progress Notes (Signed)
Telemetry notified pt HR was greater than 130 VS were rechecked HR 111-118, oncoming nurse notified of prn cardizem if HR sustained greater than 120. VS will be monitored per protocol.

## 2020-12-29 NOTE — Progress Notes (Signed)
Pulmonary Medicine          Date: 12/29/2020,   MRN# [id number] Scott Jordan [birth year]     AdmissionWeight: 61.7 kg                 CurrentWeight: 70.8 kg  Referring physician: Dr Cyndia Skeeters    CHIEF COMPLAINT:   Acute on chronic hypoxemic respiratory failure   HISTORY OF PRESENT ILLNESS   This is a pleasant male with hx of lung cancer, stage 4 small cell ca, substance abuse, chronic HCV, bony metastasis who is noted to have abnormal chest imaging with complete white out of left lung.    He appears weak and is severely dyspenic during my evaluation bu able to speak slowly.  PCCM consultation for evaluation and management. Pictorial documentation of tis thoracic imaging is included below showing large pleural effusion with associated complete left atelcetasis.   12/27/20- patient feels improved, s/p throacentesis 700 cc removed,  repeat CXR this am with improvement on left.   Atelectasis is still present and he need to have aggressive BPH, will order metaneb with albuterol today to be done with RT twice daily   12/28/20- patient was seen and evaluated at bedside with family present we discussed potential tunneled pleural catheter and patient is imrproved.    12/29/20- patient is stable on 2L/min [place] speaking on phone in full sentences.  Palliative evaluation for possible comfort care. He had thoracentesis but no malignant cells found on cytology. He is feeling better and working with PT/OT.   PAST MEDICAL HISTORY   Past Medical History:  Diagnosis Date  . A-fib ([place]) 01/10/2019   pt st this was dx by Dr. Ubaldo Glassing  . Cancer ([place]) 2021   LUNG  . Complication of anesthesia    DIFFICULTY WAKING UP AFTER SURGERY- 20 YRS AGO  . Hypertension   . Lung cancer ([place])   . Substance abuse ([place])      SURGICAL HISTORY   Past Surgical History:  Procedure Laterality Date  . ARM DEBRIDEMENT Left    INCISION AND DEBRIDEMENT LOWER ARM -20 YRS AGO  . BACK SURGERY    . IR PERC  CHOLECYSTOSTOMY  12/20/2020  . PORTACATH PLACEMENT Left 08/29/2019   Procedure: INSERTION PORT-A-CATH;  Surgeon: Nestor Lewandowsky, MD;  Location: [facility] ORS;  Service: General;  Laterality: Left;     FAMILY HISTORY   History reviewed. No pertinent family history.   SOCIAL HISTORY   Social History   Tobacco Use  . Smoking status: Former    Packs/day: 0.50    Types: Cigarettes  . Smokeless tobacco: Never  . Tobacco comments:    not smoked since 6 weeks ago  Vaping Use  . Vaping Use: Never used  Substance Use Topics  . Alcohol use: Yes    Comment: 84 OZ IN WEEK  . Drug use: Not Currently    Types: Heroin    Comment: heroin WITHIN PAST YEAR     MEDICATIONS    Home Medication:    Current Medication:  Current Facility-Administered Medications:  .  0.9 %  sodium chloride infusion (Manually program via Guardrails IV Fluids), , Intravenous, Once, Sharen Hones, MD .  0.9 %  sodium chloride infusion, , Intravenous, PRN, Sharen Hones, MD, Stopped at 12/25/20 1558 .  0.9 %  sodium chloride infusion, , , Continuous PRN, Criselda Peaches, MD, Last Rate: 10 mL/hr at 12/20/20 1220, 10 mL/hr at 12/20/20 1220 .  acetaminophen (TYLENOL) tablet 650  mg, 650 mg, Oral, Q6H PRN **OR** acetaminophen (TYLENOL) suppository 650 mg, 650 mg, Rectal, Q6H PRN, Cox, Amy N, DO .  albuterol (PROVENTIL) (2.5 MG/3ML) 0.083% nebulizer solution 2.5 mg, 2.5 mg, Nebulization, Q6H PRN, Lanney Gins, Nil Bolser, MD .  chlorhexidine (PERIDEX) 0.12 % solution 10 mL, 10 mL, Mouth/Throat, BID, Earlie Server, MD, 10 mL at 12/28/20 2231 .  Chlorhexidine Gluconate Cloth 2 % PADS 6 each, 6 each, Topical, Daily, Cox, Amy N, DO, 6 each at 12/29/20 1649 .  diltiazem (CARDIZEM CD) 24 hr capsule 240 mg, 240 mg, Oral, Daily, Cox, Amy N, DO, 240 mg at 12/29/20 1650 .  diltiazem (CARDIZEM) tablet 30 mg, 30 mg, Oral, Q6H PRN, Cyndia Skeeters, Taye T, MD .  dronabinol (MARINOL) capsule 5 mg, 5 mg, Oral, BID AC, Earlie Server, MD, 5 mg at 12/29/20 1649 .   folic acid (FOLVITE) tablet 1 mg, 1 mg, Oral, Daily, Sharen Hones, MD, 1 mg at 12/28/20 1501 .  furosemide (LASIX) tablet 20 mg, 20 mg, Oral, Daily, Gonfa, Taye T, MD, 20 mg at 12/29/20 1650 .  guaiFENesin (MUCINEX) 12 hr tablet 1,200 mg, 1,200 mg, Oral, BID, Dorothe Pea, RPH, 1,200 mg at 12/29/20 1649 .  heparin ADULT infusion 100 units/mL (25000 units/226mL), 2,000 Units/hr, Intravenous, Continuous, Dallie Piles, RPH, Last Rate: 20 mL/hr at 12/29/20 1022, 2,000 Units/hr at 12/29/20 1022 .  mirtazapine (REMERON) tablet 15 mg, 15 mg, Oral, QHS, Earlie Server, MD .  multivitamin with minerals tablet 1 tablet, 1 tablet, Oral, Daily, Sharen Hones, MD, 1 tablet at 12/28/20 1502 .  ondansetron (ZOFRAN) tablet 4 mg, 4 mg, Oral, Q6H PRN **OR** ondansetron (ZOFRAN) injection 4 mg, 4 mg, Intravenous, Q6H PRN, Cox, Amy N, DO .  oxyCODONE-acetaminophen (PERCOCET/ROXICET) 5-325 MG per tablet 1 tablet, 1 tablet, Oral, Q6H PRN, Mansy, Jan A, MD, 1 tablet at 12/24/20 0944 .  sodium chloride flush (NS) 0.9 % injection 10-40 mL, 10-40 mL, Intracatheter, Q12H, Manuella Ghazi, Vipul, MD, 10 mL at 12/29/20 1000 .  sodium chloride flush (NS) 0.9 % injection 10-40 mL, 10-40 mL, Intracatheter, PRN, Manuella Ghazi, Vipul, MD .  sodium chloride flush (NS) 0.9 % injection 5 mL, 5 mL, Intracatheter, Q8H, Criselda Peaches, MD, 5 mL at 12/29/20 1400 .  thiamine tablet 100 mg, 100 mg, Oral, Daily, Sharen Hones, MD, 100 mg at 12/28/20 1501  Facility-Administered Medications Ordered in Other Encounters:  .  sodium chloride flush (NS) 0.9 % injection 10 mL, 10 mL, Intravenous, PRN, Mike Gip, Melissa C, MD, 10 mL at 11/11/19 4166    ALLERGIES   Patient has no known allergies.     REVIEW OF SYSTEMS    Review of Systems:  Gen:  Denies  fever, sweats, chills weigh loss  HEENT: Denies blurred vision, double vision, ear pain, eye pain, hearing loss, nose bleeds, sore throat Cardiac:  No dizziness, chest pain or heaviness, chest  tightness,edema Resp:   Denies cough or sputum porduction, shortness of breath,wheezing, hemoptysis,  Gi: Denies swallowing difficulty, stomach pain, nausea or vomiting, diarrhea, constipation, bowel incontinence Gu:  Denies bladder incontinence, burning urine Ext:   Denies Joint pain, stiffness or swelling Skin: Denies  skin rash, easy bruising or bleeding or hives Endoc:  Denies polyuria, polydipsia , polyphagia or weight change Psych:   Denies depression, insomnia or hallucinations   Other:  All other systems negative   VS: BP 128/88 (BP Location: Right Arm)   Pulse (!) 102   Temp 97.9 F (36.6 C) (Oral)  Resp 20   Ht 6' (1.829 m)   Wt 70.8 kg   SpO2 98%   BMI 21.17 kg/m      PHYSICAL EXAM    GENERAL:NAD, no fevers, chills, no weakness no fatigue HEAD: Normocephalic, atraumatic.  EYES: Pupils equal, round, reactive to light. Extraocular muscles intact. No scleral icterus.  MOUTH: Moist mucosal membrane. Dentition intact. No abscess noted.  EAR, NOSE, THROAT: Clear without exudates. No external lesions.  NECK: Supple. No thyromegaly. No nodules. No JVD.  PULMONARY: Decreased BS on left CARDIOVASCULAR: S1 and S2. Regular rate and rhythm. No murmurs, rubs, or gallops. No edema. Pedal pulses 2+ bilaterally.  GASTROINTESTINAL: Soft, nontender, nondistended. No masses. Positive bowel sounds. No hepatosplenomegaly.  MUSCULOSKELETAL: No swelling, clubbing, or edema. Range of motion full in all extremities.  NEUROLOGIC: Cranial nerves II through XII are intact. No gross focal neurological deficits. Sensation intact. Reflexes intact.  SKIN: No ulceration, lesions, rashes, or cyanosis. Skin warm and dry. Turgor intact.  PSYCHIATRIC: Mood, affect within normal limits. The patient is awake, alert and oriented x 3. Insight, judgment intact.       IMAGING    CT ABDOMEN PELVIS WO CONTRAST  Result Date: 12/17/2020 CLINICAL DATA:  Nausea and vomiting EXAM: CT ABDOMEN AND PELVIS  WITHOUT CONTRAST TECHNIQUE: Multidetector CT imaging of the abdomen and pelvis was performed following the standard protocol without IV contrast. COMPARISON:  CT 06/23/2020, PET CT 11/19/2020 FINDINGS: Lower chest: Lung bases demonstrate emphysema. Subpleural bandlike airspace disease at the left lower lobe redemonstrated. Incompletely visualized subpleural peripheral left upper lobe nodule measuring 11 mm, series 5, image 1. Cardiomegaly with trace pericardial effusion. Coronary vascular calcification. Hepatobiliary: Extensive motion degradation. No focal hepatic abnormality. Gallstones. Gallbladder slightly distended. Possible wall thickening or pericholecystic fluid though limited by motion Pancreas: Unremarkable. No pancreatic ductal dilatation or surrounding inflammatory changes. Spleen: Normal in size without focal abnormality. Adrenals/Urinary Tract: Thickened appearance of adrenal glands. No hydronephrosis. The bladder is unremarkable Stomach/Bowel: Stomach nonenlarged. No dilated small bowel. Negative appendix. No acute bowel wall thickening Vascular/Lymphatic: Advanced aortic atherosclerosis. No aneurysm. Slightly increased multiple small retroperitoneal lymph nodes. Reproductive: Prostate is unremarkable. Other: Negative for free air or free fluid Musculoskeletal: No acute osseous abnormality. IMPRESSION: 1. Degraded by motion. 2. Gallbladder appears slightly distended and there are gallstones. Possible gallbladder wall thickening or pericholecystic fluid though limited by motion, consider correlation with ultrasound 3. Cardiomegaly with trace pericardial effusion. 4. Similar subpleural bandlike density at left lower lobe. Incompletely visualized peripheral left upper lobe lung nodule characterized on recent PET CT Electronically Signed   By: Donavan Foil M.D.   On: 12/17/2020 16:50   CT ANGIO HEAD NECK W WO CM  Result Date: 12/21/2020 CLINICAL DATA:  Neuro deficit, acute, stroke suspected.  Neutropenic sepsis. EXAM: CT ANGIOGRAPHY HEAD AND NECK TECHNIQUE: Multidetector CT imaging of the head and neck was performed using the standard protocol during bolus administration of intravenous contrast. Multiplanar CT image reconstructions and MIPs were obtained to evaluate the vascular anatomy. Carotid stenosis measurements (when applicable) are obtained utilizing NASCET criteria, using the distal internal carotid diameter as the denominator. CONTRAST:  68mL OMNIPAQUE IOHEXOL 350 MG/ML SOLN COMPARISON:  MRI of the brain December 20, 2020. FINDINGS: CT HEAD FINDINGS Brain: Confluent hypodensity of the white matter of the cerebral hemispheres, nonspecific, most likely related to chronic small vessel ischemia. Foci of acute infarct are more conspicuous on recent MRI of the brain. No new large territorial infarct. No hemorrhage, hydrocephalus, mass or  midline shift. Vascular: No hyperdense vessel or unexpected calcification. Skull: Normal. Negative for fracture or focal lesion. Sinuses: Increased thickness of the walls of the bilateral maxillary sinuses with bubbly secretion on the left. Findings are suggestive of chronic sinusitis. Right mastoid effusion. Orbits: No acute finding. Review of the MIP images confirms the above findings CTA NECK FINDINGS Aortic arch: Standard branching. Imaged portion shows no evidence of aneurysm or dissection. Mild atherosclerotic changes of the aortic arch without significant stenosis of the major arch vessel origins. Right carotid system: Calcified atherosclerotic disease of the right carotid bifurcation without hemodynamically significant stenosis. Left carotid system: Atherosclerotic disease of the left carotid bifurcation with mixed density plaques without hemodynamically significant stenosis. Vertebral arteries: Calcified atherosclerotic plaque at the origin of the right vertebral artery without hemodynamically significant stenosis. Mild atherosclerotic changes at the origin  of the left vertebral artery without hemodynamically significant stenosis. Cervical vertebral arteries otherwise have normal caliber Skeleton: Degenerative changes of the cervical spine with fusion of the facet joints at C2-3 on the left. No acute findings. Other neck: Negative. Upper chest: Left-sided pleural effusion and atelectasis. Mediastinal lymphadenopathy. Review of the MIP images confirms the above findings CTA HEAD FINDINGS Anterior circulation: Calcified atherosclerotic plaques in the bilateral carotid siphons. No significant stenosis, proximal occlusion, aneurysm, or vascular malformation. Posterior circulation: No significant stenosis, proximal occlusion, aneurysm, or vascular malformation. Hypoplastic/aplastic right P1/PCA segment with right fetal PCA. Venous sinuses: As permitted by contrast timing, patent. Anatomic variants: Right fetal PCA. Review of the MIP images confirms the above findings IMPRESSION: 1. Advanced chronic microvascular ischemic changes of the white matter. 2. Atherosclerotic disease of the bilateral carotid bifurcation without hemodynamically significant stenosis. 3. Mild atherosclerotic changes at the origin of the bilateral vertebral arteries without hemodynamically significant stenosis. 4. Mild intracranial atherosclerotic disease without hemodynamically significant stenosis. Electronically Signed   By: Pedro Earls M.D.   On: 12/21/2020 15:32   DG Chest 1 View  Result Date: 12/25/2020 CLINICAL DATA:  Thoracentesis.  Lung cancer EXAM: CHEST  1 VIEW COMPARISON:  Radiograph 12/25/2020 FINDINGS: Interval reduction in LEFT pleural fluid following thoracentesis. No pneumothorax appreciated. A small basilar effusion remains. RIGHT lung clear. Port in the anterior chest wall with tip in distal SVC. IMPRESSION: 1. No pneumothorax appreciated following LEFT thoracentesis. 2. Considerable reduction in LEFT pleural fluid. Electronically Signed   By: Suzy Bouchard  M.D.   On: 12/25/2020 17:28   DG Chest 2 View  Result Date: 12/27/2020 CLINICAL DATA:  Status post thoracentesis 2 days prior. EXAM: CHEST - 2 VIEW COMPARISON:  Radiograph same day (12/27/2020 at 830 hours FINDINGS: Persistent near complete opacification of the LEFT hemithorax. Small amount of aerated lung remains at LEFT lung apex. Port in place. RIGHT lung clear. IMPRESSION: No interval change in near complete opacification LEFT hemithorax. Electronically Signed   By: Suzy Bouchard M.D.   On: 12/27/2020 18:17   CT HEAD WO CONTRAST (5MM)  Result Date: 12/17/2020 CLINICAL DATA:  Nausea vomiting EXAM: CT HEAD WITHOUT CONTRAST TECHNIQUE: Contiguous axial images were obtained from the base of the skull through the vertex without intravenous contrast. COMPARISON:  CT brain 06/24/2020, MRI 03/10/2020, head CT 11/22/2019 FINDINGS: Brain: No acute territorial infarction, hemorrhage or intracranial mass. Extensive white matter disease, progressed from more remote exams from 2021. Stable ventricle size. Chronic lacunar infarcts within the left white matter. Vascular: No hyperdense vessel.  Carotid vascular calcification Skull: Normal. Negative for fracture or focal lesion. Sinuses/Orbits: Mucosal thickening the  sinuses. Chronic appearing nasal deformity Other: None IMPRESSION: 1. No definite CT evidence for acute intracranial abnormality. 2. Atrophy. Extensive white matter disease, grossly stable as compared with recent head CT from March, appears progressive when compared to exams from 2021 and prior Electronically Signed   By: Donavan Foil M.D.   On: 12/17/2020 16:39   MR BRAIN W WO CONTRAST  Addendum Date: 12/21/2020   ADDENDUM REPORT: 12/21/2020 14:49 ADDENDUM: Patient returns for repeat postcontrast imaging. There is no abnormal enhancement. No evidence of intracranial metastatic disease. Electronically Signed   By: Macy Mis M.D.   On: 12/21/2020 14:49   Result Date: 12/21/2020 CLINICAL DATA:   Neuro deficit, acute, stroke suspected Small cell lung cancer, monitor EXAM: MRI HEAD WITHOUT AND WITH CONTRAST TECHNIQUE: Multiplanar, multiecho pulse sequences of the brain and surrounding structures were obtained without and with intravenous contrast. CONTRAST:  7.30mL GADAVIST GADOBUTROL 1 MMOL/ML IV SOLN COMPARISON:  CT head 12/17/2020. FINDINGS: Mildly motion limited exam.  Within this limitation: Brain: Multiple small areas of restricted diffusion in the bilateral frontal and occipital, and left parietal matter, left perirolandic cortex, left thalamocapsular region, and left cerebellum. Advanced confluent T2 hyperintensity within the white matter, nonspecific but compatible with chronic microvascular ischemic disease and progressed since 2021. Small remote infarct in the left corona radiata. No hydrocephalus, obvious mass lesion, midline shift, acute hemorrhage, or extra-axial fluid collection. Punctate foci of susceptibility artifact in the left cerebellum and left frontal white matter, nonspecific but potentially related to prior microhemorrhage. Contrast was administered; however, there is no evidence of enhancement (for example, the nasal mucosa/turbinates are nonenhancing). This precludes evaluation for enhancing lesions. Vascular: Major arterial flow voids are maintained at the skull base. Skull and upper cervical spine: Normal marrow signal. Sinuses/Orbits: Mild paranasal sinus disease. Other: Moderate right and small left mastoid effusions. IMPRESSION: 1. Multiple small areas of restricted diffusion in the bilateral frontal and occipital and left parietal white matter, left perirolandic cortex, left thalamocapsular region, and left cerebellum, which are concerning for acute infarcts. Given involvement of multiple vascular territories, consider a central embolic etiology. 2. Contrast was administered; however, there is no evidence of enhancement, unclear why given reported uneventful injection per the  technologist. This precludes evaluation for enhancing lesions. Given the patient's known small cell lung cancer diagnosis, recommend repeat postcontrast imaging to exclude metastatic disease. I've asked the MRI technologist to not bill the patient for the repeat post-contrast imaging. 3. Advanced chronic microvascular ischemic disease, progressed since 2021. 4. Moderate right and small left mastoid effusions. Electronically Signed: By: Margaretha Sheffield M.D. On: 12/20/2020 14:29   IR Perc Cholecystostomy  Result Date: 12/20/2020 INDICATION: 70 year old male with small cell lung cancer and chemotherapy induced pancytopenia presenting with neutropenic sepsis. Blood cultures grew out Klebsiella and imaging findings are concerning for acute cholecystitis. He is too sick and frail to be considered an operative candidate. Following transfusion of platelets, his platelets are now at an acceptable level to proceed with percutaneous cholecystostomy tube placement. EXAM: CHOLECYSTOSTOMY MEDICATIONS: In patient currently receiving intravenous antibiotic therapy. No additional antibiotic prophylaxis administered. ANESTHESIA/SEDATION: Moderate (conscious) sedation was employed during this procedure. A total of Versed 1 mg and Fentanyl 50 mcg and 0.5 mg Dilaudid administered intravenously. Moderate Sedation Time: 13 minutes. The patient's level of consciousness and vital signs were monitored continuously by radiology nursing throughout the procedure under my direct supervision. FLUOROSCOPY TIME:  Fluoroscopy Time: 0 minutes 30 seconds (4.3 mGy). COMPLICATIONS: None immediate. PROCEDURE: Informed written consent  was obtained from the patient after a thorough discussion of the procedural risks, benefits and alternatives. All questions were addressed. Maximal Sterile Barrier Technique was utilized including caps, mask, sterile gowns, sterile gloves, sterile drape, hand hygiene and skin antiseptic. A timeout was performed prior  to the initiation of the procedure. The right upper quadrant was interrogated with ultrasound. The distended gallbladder with a thickened wall was successfully identified. There is significant excursion of the liver with respiration due to use of abdominal muscles. A suitable skin entry site was selected. Local anesthesia was attained by infiltration with 1% lidocaine. A small dermatotomy was made. Under real-time ultrasound guidance, the movement of the liver was timed with respirations and in a single pass, a 21 gauge Accustick needle was advanced through the liver and into the gallbladder lumen along a short transhepatic course. A 0.018 wire was quickly advanced into the gallbladder lumen and the needle was exchanged for the transitional dilator. The transitional dilator was advanced into the gallbladder lumen. The wire was removed. Contrast injection was performed under fluoroscopy confirming opacification of the gallbladder lumen. Multiple filling defects consistent with cholelithiasis. The cystic duct is occluded. A 0.035 wire was then coiled within the gallbladder lumen. The transitional dilator was removed. The transhepatic tract was dilated to 10 [place] with a stiff dilator. A Cook 10.2 [place] all-purpose drainage catheter was then advanced over the wire and formed in the gallbladder lumen. Images were obtained and stored for the medical record. The catheter was gently flushed and connected to gravity bag drainage before being secured to the skin with 0 silk suture. IMPRESSION: Successful placement of 10 French transhepatic percutaneous cholecystostomy tube for an indication of calculus cholecystitis. PLAN: Maintain tube to gravity bag drainage. Patient will likely need home health to assist with care and cleaning of the tube site. Return to interventional radiology approximately every 8 weeks for cholecystostomy tube check and exchange. If patient's clinical status improves in the future, elective  cholecystectomy would be optimal. If patient remains a poor operative candidate, long-term cholecystostomy tube maintenance will likely be required. Electronically Signed   By: Jacqulynn Cadet M.D.   On: 12/20/2020 13:50   DG Chest Port 1 View  Result Date: 12/27/2020 CLINICAL DATA:  Shortness of breath with atelectasis common thoracentesis 2 days ago EXAM: PORTABLE CHEST 1 VIEW COMPARISON:  Chest radiograph 12/25/2020 FINDINGS: The left chest wall port is in stable position with the tip terminating in the region of the right atrium. The cardiomediastinal silhouette can not be assessed. The left pleural effusion has increased in size with worsening aeration of the left upper lobe. Aeration of the right lung is not significantly changed. There is no significant right effusion. There is no appreciable pneumothorax. There is no acute osseous abnormality. Enteric contrast is noted in the colon. IMPRESSION: Increased size of the large left pleural effusion with worsened aeration of the left lung. Electronically Signed   By: Valetta Mole M.D.   On: 12/27/2020 09:12   DG Chest Port 1 View  Result Date: 12/25/2020 CLINICAL DATA:  Shortness of breath EXAM: PORTABLE CHEST 1 VIEW COMPARISON:  Chest x-ray 12/17/2020, PET CT 11/19/2020 FINDINGS: Left-sided central venous port tip over right atrium. Interval complete opacification of left thorax, suspect that there is at least moderate pleural effusion. Cardiomediastinal silhouette is obscured. No pneumothorax is visualized. IMPRESSION: Interval opacification of the entire left thorax which may be due to combination of pleural effusion/atelectasis/airspace disease. Electronically Signed   By: Donavan Foil  M.D.   On: 12/25/2020 15:19   DG Chest Port 1 View  Result Date: 12/17/2020 CLINICAL DATA:  Questionable sepsis.  Weakness EXAM: PORTABLE CHEST 1 VIEW COMPARISON:  03/09/2020 FINDINGS: Left subclavian approached chest port remains in place. Stable cardiomegaly.  Chronic left basilar scarring or atelectasis. Peripheral left upper lobe nodule better seen on prior CT. No new airspace consolidation. Pleural effusion or pneumothorax. IMPRESSION: Chronic left basilar scarring or atelectasis. No new or acute findings. Electronically Signed   By: Davina Poke D.O.   On: 12/17/2020 13:20   DG Swallowing Func-Speech Pathology  Result Date: 12/25/2020 Table formatting from the original result was not included. Objective Swallowing Evaluation: Type of Study: MBS-Modified Barium Swallow Study  Patient Details Name: ALYAS CREARY MRN: [id number] Date of Birth: Aug 16, 1950 Today's Date: 12/25/2020 Time: SLP Start Time (ACUTE ONLY): 26 -SLP Stop Time (ACUTE ONLY): 1300 SLP Time Calculation (min) (ACUTE ONLY): 30 min Past Medical History: Past Medical History: Diagnosis Date . A-fib ([place]) 01/10/2019  pt st this was dx by Dr. Ubaldo Glassing . Cancer ([place]) 2021  LUNG . Complication of anesthesia   DIFFICULTY WAKING UP AFTER SURGERY- 20 YRS AGO . Hypertension  . Lung cancer ([place])  . Substance abuse [facility])  Past Surgical History: Past Surgical History: Procedure Laterality Date . ARM DEBRIDEMENT Left   INCISION AND DEBRIDEMENT LOWER ARM -20 YRS AGO . BACK SURGERY   . IR PERC CHOLECYSTOSTOMY  12/20/2020 . PORTACATH PLACEMENT Left 08/29/2019  Procedure: INSERTION PORT-A-CATH;  Surgeon: Nestor Lewandowsky, MD;  Location: [facility] ORS;  Service: General;  Laterality: Left; HPI: Pt is a 70 year old male with PMH of 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 fib not on AC, chronic hep C, chronic cancer pain, HTN, alcohol abuse, malnutrition and polysubstance who is admitted with neutropenic sepsis due to acute cholecystitis and Klebsiella bacteremia now s/p cholecystostomy on 9/11 by IR. Hospital admission complicated by acute thromboembolic stroke with new right-sided weakness-confirmed on MRI brain on 12/20/2020.  MRI: Multiple small areas of restricted diffusion in the  bilateral  frontal and occipital and left parietal white matter, left  perirolandic cortex, left thalamocapsular region, and left  cerebellum, which are concerning for acute infarcts.  CXR at admit: Chronic left basilar scarring or atelectasis. No new or acute  findings.  Pt is followed by Palliative Care at [facility].  Subjective: pt able to communicate his wants/needs Assessment / Plan / Recommendation CHL IP CLINICAL IMPRESSIONS 12/25/2020 Clinical Impression Pt presents with adequate airway protection while consuming thin liquids, nectar thick liquids, puree and whole barium tablet with thin liquids. Pt's oral phase is unremarkable and his swallow response is timely with good airway protection. His overall medical comorbidities and respiratory decline place him at an overall increased risk of aspiration. For example, when consuming consecutive sips of thin liquids, pt became short of breath. At this time, recommend pt continue consuming dysphagia 3 diet for energy conservation with thin liquids, medicine whole with thin liquids. Pt needs to follow strict aspiration precautions such as taking single sips and rest breaks. SLP Visit Diagnosis Dysphagia, oropharyngeal phase (R13.12) Attention and concentration deficit following -- Frontal lobe and executive function deficit following -- Impact on safety and function Mild aspiration risk;Moderate aspiration risk   CHL IP TREATMENT RECOMMENDATION 12/25/2020 Treatment Recommendations No treatment recommended at this time   Prognosis 12/24/2020 Prognosis for Safe Diet Advancement Fair Barriers to Reach Goals Motivation;Time post onset;Severity of deficits;Behavior Barriers/Prognosis Comment --  CHL IP DIET RECOMMENDATION 12/25/2020 SLP Diet Recommendations Dysphagia 3 (Mech soft) solids;Thin liquid Liquid Administration via Cup Medication Administration Whole meds with liquid Compensations Minimize environmental distractions;Slow rate;Small sips/bites Postural Changes  Remain semi-upright after after feeds/meals (Comment);Seated upright at 90 degrees   CHL IP OTHER RECOMMENDATIONS 12/25/2020 Recommended Consults -- Oral Care Recommendations Oral care BID Other Recommendations --   CHL IP FOLLOW UP RECOMMENDATIONS 12/25/2020 Follow up Recommendations None   CHL IP FREQUENCY AND DURATION 12/24/2020 Speech Therapy Frequency (ACUTE ONLY) min 3x week Treatment Duration 2 weeks      CHL IP ORAL PHASE 12/25/2020 Oral Phase WFL Oral - Pudding Teaspoon -- Oral - Pudding Cup -- Oral - Honey Teaspoon -- Oral - Honey Cup -- Oral - Nectar Teaspoon -- Oral - Nectar Cup -- Oral - Nectar Straw -- Oral - Thin Teaspoon -- Oral - Thin Cup -- Oral - Thin Straw -- Oral - Puree -- Oral - Mech Soft -- Oral - Regular -- Oral - Multi-Consistency -- Oral - Pill -- Oral Phase - Comment --  CHL IP PHARYNGEAL PHASE 12/25/2020 Pharyngeal Phase WFL Pharyngeal- Pudding Teaspoon -- Pharyngeal -- Pharyngeal- Pudding Cup -- Pharyngeal -- Pharyngeal- Honey Teaspoon -- Pharyngeal -- Pharyngeal- Honey Cup -- Pharyngeal -- Pharyngeal- Nectar Teaspoon -- Pharyngeal -- Pharyngeal- Nectar Cup -- Pharyngeal -- Pharyngeal- Nectar Straw -- Pharyngeal -- Pharyngeal- Thin Teaspoon -- Pharyngeal -- Pharyngeal- Thin Cup -- Pharyngeal -- Pharyngeal- Thin Straw -- Pharyngeal -- Pharyngeal- Puree -- Pharyngeal -- Pharyngeal- Mechanical Soft -- Pharyngeal -- Pharyngeal- Regular -- Pharyngeal -- Pharyngeal- Multi-consistency -- Pharyngeal -- Pharyngeal- Pill -- Pharyngeal -- Pharyngeal Comment --  CHL IP CERVICAL ESOPHAGEAL PHASE 12/25/2020 Cervical Esophageal Phase WFL Pudding Teaspoon -- Pudding Cup -- Honey Teaspoon -- Honey Cup -- Nectar Teaspoon -- Nectar Cup -- Nectar Straw -- Thin Teaspoon -- Thin Cup -- Thin Straw -- Puree -- Mechanical Soft -- Regular -- Multi-consistency -- Pill -- Cervical Esophageal Comment -- Happi B. [name] M.S., CCC-SLP, [place] Office [phone] Stormy Fabian 12/25/2020, 2:17 PM              ECHOCARDIOGRAM COMPLETE  Result Date: 12/21/2020    ECHOCARDIOGRAM REPORT   Patient Name:   TARRELL DEBES Date of Exam: 12/21/2020 Medical Rec #:  [id number]    Height:       72.0 in Accession #:    [id number]   Weight:       137.1 lb Date of Birth:  July 13, 1950     BSA:          1.815 m Patient Age:    71 years     BP:           114/80 mmHg Patient Gender: M            HR:           89 bpm. Exam Location:  [facility] Procedure: 2D Echo, Color Doppler and Cardiac Doppler Indications:     Endocarditis I38  History:         Patient has prior history of Echocardiogram examinations, most                  recent 08/19/2019. Arrythmias:Atrial Fibrillation; Risk                  Factors:Hypertension. Substance abuse.  Sonographer:     Sherrie Sport Referring Phys:  [place] Diagnosing Phys: Serafina Royals MD IMPRESSIONS  1. Left  ventricular ejection fraction, by estimation, is 60 to 65%. The left ventricle has normal function. The left ventricle has no regional wall motion abnormalities. Left ventricular diastolic parameters were normal.  2. Right ventricular systolic function is normal. The right ventricular size is normal.  3. The mitral valve is normal in structure. Trivial mitral valve regurgitation.  4. The aortic valve is normal in structure. Aortic valve regurgitation is not visualized. Mild aortic valve sclerosis is present, with no evidence of aortic valve stenosis. Conclusion(s)/Recommendation(s): No evidence of valve vegetations. FINDINGS  Left Ventricle: Left ventricular ejection fraction, by estimation, is 60 to 65%. The left ventricle has normal function. The left ventricle has no regional wall motion abnormalities. The left ventricular internal cavity size was small. There is no left ventricular hypertrophy. Left ventricular diastolic parameters were normal. Right Ventricle: The right ventricular size is normal. No increase in right ventricular wall thickness. Right  ventricular systolic function is normal. Left Atrium: Left atrial size was normal in size. Right Atrium: Right atrial size was normal in size. Pericardium: There is no evidence of pericardial effusion. Mitral Valve: The mitral valve is normal in structure. Trivial mitral valve regurgitation. Tricuspid Valve: The tricuspid valve is normal in structure. Tricuspid valve regurgitation is trivial. Aortic Valve: The aortic valve is normal in structure. Aortic valve regurgitation is not visualized. Mild aortic valve sclerosis is present, with no evidence of aortic valve stenosis. Aortic valve mean gradient measures 3.0 mmHg. Aortic valve peak gradient measures 5.9 mmHg. Aortic valve area, by VTI measures 2.30 cm. Pulmonic Valve: The pulmonic valve was normal in structure. Pulmonic valve regurgitation is not visualized. Aorta: The aortic root and ascending aorta are structurally normal, with no evidence of dilitation. IAS/Shunts: No atrial level shunt detected by color flow Doppler.  LEFT VENTRICLE PLAX 2D LVIDd:         3.87 cm LVIDs:         2.30 cm LV PW:         1.00 cm LV IVS:        1.18 cm LVOT diam:     2.10 cm LV SV:         44 LV SV Index:   24 LVOT Area:     3.46 cm  RIGHT VENTRICLE RV Basal diam:  3.70 cm RV S prime:     12.40 cm/s TAPSE (M-mode): 3.7 cm LEFT ATRIUM           Index       RIGHT ATRIUM           Index LA diam:      3.50 cm 1.93 cm/m  RA Area:     33.70 cm LA Vol (A2C): 57.4 ml 31.63 ml/m RA Volume:   128.00 ml 70.53 ml/m LA Vol (A4C): 72.1 ml 39.73 ml/m  AORTIC VALVE                   PULMONIC VALVE AV Area (Vmax):    2.34 cm    PV Vmax:        0.60 m/s AV Area (Vmean):   2.32 cm    PV Peak grad:   1.4 mmHg AV Area (VTI):     2.30 cm    RVOT Peak grad: 2 mmHg AV Vmax:           121.00 cm/s AV Vmean:          86.000 cm/s AV VTI:  0.193 m AV Peak Grad:      5.9 mmHg AV Mean Grad:      3.0 mmHg LVOT Vmax:         81.60 cm/s LVOT Vmean:        57.600 cm/s LVOT VTI:          0.128 m  LVOT/AV VTI ratio: 0.66  AORTA Ao Root diam: 3.50 cm MITRAL VALVE               TRICUSPID VALVE MV Area (PHT): 4.52 cm    TR Peak grad:   24.4 mmHg MV Decel Time: 168 msec    TR Vmax:        247.00 cm/s MV E velocity: 88.30 cm/s                            SHUNTS                            Systemic VTI:  0.13 m                            Systemic Diam: 2.10 cm Serafina Royals MD Electronically signed by Serafina Royals MD Signature Date/Time: 12/21/2020/5:20:09 PM    Final    US Abdomen Limited RUQ (LIVER/GB)  Result Date: 12/18/2020 CLINICAL DATA:  Evaluate for cholecystitis. EXAM: ULTRASOUND ABDOMEN LIMITED RIGHT UPPER QUADRANT COMPARISON:  CT AP from 12/17/2020 FINDINGS: Gallbladder: The gallbladder wall is diffusely edematous measuring up to 13.1 mm. Gallstones are noted measuring up to 5.9 mm. Pericholecystic fluid. Positive sonographic Murphy's sign. Common bile duct: Diameter: 6 mm Liver: Diffusely increased parenchymal echogenicity identified. No focal liver abnormality. Portal vein is patent on color Doppler imaging with normal direction of blood flow towards the liver. Other: 1.9 cm right kidney cyst. IMPRESSION: Gallstones, gallbladder wall thickening and pericholecystic fluid with positive sonographic Murphy's sign. Findings are compatible with acute cholecystitis. Electronically Signed   By: Kerby Moors M.D.   On: 12/18/2020 14:18   US THORACENTESIS ASP PLEURAL SPACE W/IMG GUIDE  Result Date: 12/25/2020 INDICATION: Large left pleural effusion secondary to known lung cancer. Request for diagnostic and therapeutic thoracentesis. EXAM: ULTRASOUND GUIDED LEFT THORACENTESIS MEDICATIONS: 1% lidocaine 10 mL COMPLICATIONS: None immediate. PROCEDURE: An ultrasound guided thoracentesis was thoroughly discussed with the patient and questions answered. The benefits, risks, alternatives and complications were also discussed. The patient understands and wishes to proceed with the procedure. Written consent was  obtained. Ultrasound was performed to localize and mark an adequate pocket of fluid in the left chest. The area was then prepped and draped in the normal sterile fashion. 1% Lidocaine was used for local anesthesia. Under ultrasound guidance a 6 Fr Safe-T-Centesis catheter was introduced. Thoracentesis was performed. The catheter was removed and a dressing applied. FINDINGS: A total of approximately 700 mL of hazy yellow fluid was removed. Samples were sent to the laboratory as requested by the clinical team. IMPRESSION: Successful ultrasound guided left thoracentesis yielding 700 mL of pleural fluid. Read by: Gareth Eagle, PA-C Electronically Signed   By: Michaelle Birks M.D.   On: 12/25/2020 16:44      ASSESSMENT/PLAN   Acute on chronic hypoxemic respiratory failure There is complete atelectasis of left lung secondary to left pleural effusion     - stop heparin transiently and perform diagnostic and therapeutic thoracentesis   -  Pleural fluid studies ordered both infections and malignant workup    - Reviewed with patient he is agreeable to plan -metaneb bid with RT  Sepsis with Klebsiella bacteremia  On IV antibiotics    - ID on case appreciate input   Extensive stage small cell lung ca Currently on chemo/immunotherapy  - oncology on case - prognosis poor - appreciate input   Thank you for allowing me to participate in the care of this patient.  Total face to face encounter time for this patient visit was >46min. >50% of the time was  spent in counseling and coordination of care.   Patient/Family are satisfied with care plan and all questions have been answered.  This document was prepared using Dragon voice recognition software and may include unintentional dictation errors.     Ottie Glazier, M.D.  Division of [place]

## 2020-12-29 NOTE — Progress Notes (Signed)
48 hr Calorie Count Day 1  Estimated Nutritional Needs:    Kcal:  2000-2300kcal/day Protein:  100-115g/day Fluid:  1.6-1.9L/day  Lunch 9/19: pt consumed 0% of this meal  Total- 0  Dinner 9/19: pt consumed 0% of this meal  Total- 0  Breakfast 9/20: 10% of pork sausage patty   Total- 20kcal and <1g protein   Supplements: pt is not consuming any supplements   Total- 0  Total Intake:  20kcal (1% estimated needs) and  <1g protein (1% estimated needs)  Koleen Distance MS, RD, LDN Please refer to [facility] for RD and/or RD on-call/weekend/after hours pager

## 2020-12-29 NOTE — Progress Notes (Signed)
ANTICOAGULATION CONSULT NOTE  Pharmacy Consult for heparin infusion Indication: afib and possible thromboembolic stroke  No Known Allergies  Patient Measurements: Height: 6' (182.9 cm) Weight: 70.8 kg (156 lb 1.4 oz) IBW/kg (Calculated) : 77.6  Vital Signs: Temp: 97.6 F (36.4 C) (09/20 0500) Temp Source: Oral (09/20 0500) BP: 118/79 (09/20 0500) Pulse Rate: 81 (09/20 0500)  Labs: Recent Labs    12/26/20 2242 12/27/20 0615 12/27/20 1158 12/27/20 2000 12/28/20 0515 12/28/20 1220 12/28/20 2250  HGB  --  8.5*  --   --  8.5*  --   --   HCT  --  24.1*  --   --  23.8*  --   --   PLT  --  310  --   --  394  --   --   APTT 64* 75* 63*  --   --   --   --   HEPARINUNFRC  --  0.25*  --    < > 0.16* 0.17* 0.28*  CREATININE  --  0.57*  --   --  0.62  --   --    < > = values in this interval not displayed.     Estimated Creatinine Clearance: 87.3 mL/min (by C-G formula based on SCr of 0.62 mg/dL).   Medical History: Past Medical History:  Diagnosis Date   A-fib ([place]) 01/10/2019   pt st this was dx by Dr. Ubaldo Glassing   Cancer [facility]) [id number]   LUNG   Complication of anesthesia    DIFFICULTY WAKING UP AFTER SURGERY- 20 YRS AGO   Hypertension    Lung cancer ([place])    Substance abuse ([place])     Medications:  Eliquis 5 mg BID - last dose 9/15 2017  Assessment: 70 year old M with PMH of stage IV small cell lung cancer on chemo, chemo induced pancytopenia, A. fib, chronic hep C, chronic cancer pain, HTN, alcohol abuse, malnutrition and polysubstance use presenting with nausea and vomiting and admitted with neutropenic sepsis due to acute cholecystitis and Klebsiella bacteremia. Pt with acute stroke confirmed on MRI 9/11. Pharmacy has been consulted for heparin dosing/monitoring.   Goal of Therapy:  Heparin level 0.3-0.5 units/ml - confirmed with MD to utilize stroke goals Monitor platelets by anticoagulation protocol: Yes   Plan:  Heparin level 0.28: subtherapeutic Increase heparin  infusion rate to 2000 units/hr Recheck heparin level 6 hours after rate change. CBC daily while on heparin  Vallery Sa, PharmD 12/29/2020 7:00 AM

## 2020-12-30 ENCOUNTER — Inpatient Hospital Stay: Payer: Medicare Other

## 2020-12-30 DIAGNOSIS — D709 Neutropenia, unspecified: Secondary | ICD-10-CM | POA: Diagnosis not present

## 2020-12-30 DIAGNOSIS — A419 Sepsis, unspecified organism: Secondary | ICD-10-CM | POA: Diagnosis not present

## 2020-12-30 LAB — CBC
HCT: 23.6 % — ABNORMAL LOW (ref 39.0–52.0)
Hemoglobin: 8.1 g/dL — ABNORMAL LOW (ref 13.0–17.0)
MCH: 33.6 pg (ref 26.0–34.0)
MCHC: 34.3 g/dL (ref 30.0–36.0)
MCV: 97.9 fL (ref 80.0–100.0)
Platelets: 539 10*3/uL — ABNORMAL HIGH (ref 150–400)
RBC: 2.41 MIL/uL — ABNORMAL LOW (ref 4.22–5.81)
RDW: 13.7 % (ref 11.5–15.5)
WBC: 14.7 10*3/uL — ABNORMAL HIGH (ref 4.0–10.5)
nRBC: 0 % (ref 0.0–0.2)

## 2020-12-30 LAB — HEPARIN LEVEL (UNFRACTIONATED)
Heparin Unfractionated: 0.46 IU/mL (ref 0.30–0.70)
Heparin Unfractionated: 0.42 IU/mL (ref 0.30–0.70)
Heparin Unfractionated: 0.5 IU/mL (ref 0.30–0.70)

## 2020-12-30 LAB — VITAMIN B12: Vitamin B-12: 5354 pg/mL — ABNORMAL HIGH (ref 180–914)

## 2020-12-30 NOTE — Progress Notes (Signed)
Physical Therapy Treatment Patient Details Name: Scott Jordan MRN: [id number] DOB: 10-May-1950 Today's Date: 12/30/2020   History of Present Illness Pt is a 70 y.o. M arriving to ED for c/o nausea, vomiting and admitted for neutropenic sepsis,severe neutropenia, cholecystitis. PMH includes stage IV small cell carcinoma, anemia, thrombocytopenia, hx of substance abuse, chronic hepatitis C, HTN., a-fib. During hospitalization has been complicated by acute CVA with residual right-sided weakness. During hospitalization multifocal acute embolic strokes occured in bilateral cerebral hemisphere & L cerebellum.    PT Comments    Pt alert, in bed, requiring encouragement for participation. Co-treatment between OT and PT occurred to address functional ADLs and transfers for pt safety. Progressed treatment to standing ~ 8 second MIN A +2, RW, Hrmax 110-115. Sit <> stand, RW w/ MOD A + 2 for safety. Bed mobility w/ MIN A, for trunk and LE management. Primary limitations include fatigue and general malaise limiting overall mobility efforts along with decreased strength and endurance necessary for functional mobility. Skilled PT intervention is indicated to address deficits in function, mobility, and to return to PLOF as able.  Discharge recommendations remain SNF.   Recommendations for follow up therapy are one component of a multi-disciplinary discharge planning process, led by the attending physician.  Recommendations may be updated based on patient status, additional functional criteria and insurance authorization.  Follow Up Recommendations  SNF;Supervision/Assistance - 24 hour     Equipment Recommendations  Other (comment) (TBD next venue of care)    Recommendations for Other Services       Precautions / Restrictions Precautions Precautions: Fall Precaution Comments: High fall Restrictions Weight Bearing Restrictions: No     Mobility  Bed Mobility Overal bed mobility: Needs Assistance Bed  Mobility: Supine to Sit;Sit to Supine     Supine to sit: Min assist;HOB elevated Sit to supine: Min assist   General bed mobility comments: MIN A for LE assist supine > sit; trunk support sit > supine    Transfers Overall transfer level: Needs assistance Equipment used: Rolling walker (2 wheeled) Transfers: Sit to/from Stand Sit to Stand: Mod assist;+2 safety/equipment         General transfer comment: MOD A + 2 for transfer , MIN A + 2 for static stance and stand > sit;  Ambulation/Gait             General Gait Details: Deferred   Stairs             Wheelchair Mobility    Modified Rankin (Stroke Patients Only)       Balance Overall balance assessment: Needs assistance Sitting-balance support: Single extremity supported;Feet supported Sitting balance-Leahy Scale: Fair Sitting balance - Comments: pt leans elbows on LE for support due to fatigue, cues for upright trunk, MIN g Postural control: Left lateral lean Standing balance support: Bilateral upper extremity supported;During functional activity Standing balance-Leahy Scale: Poor Standing balance comment: Static stance 8 sec w/ MIN A + 2 for safety w/ L lat. trunk lean, corrects with verbal cues                            Cognition Arousal/Alertness: Awake/alert Behavior During Therapy: WFL for tasks assessed/performed Overall Cognitive Status: No family/caregiver present to determine baseline cognitive functioning                                 General Comments: self-limiting,  but w/ encouragement participates; fatigues quickly      Exercises General Exercises - Upper Extremity Shoulder Horizontal ABduction: Strengthening;Both;10 reps;Supine;Theraband Theraband Level (Shoulder Horizontal Abduction): Level 1 (Yellow)    General Comments General comments (skin integrity, edema, etc.): Pt on RA, SpO2 87% post bed mobility; 2L O2 [place] provided SpO2 increased to 91%; HRmax  110-115 w/ mobility      Pertinent Vitals/Pain Pain Assessment: Faces Faces Pain Scale: Hurts little more Pain Location: General malaise Pain Intervention(s): Limited activity within patient's tolerance;Monitored during session;Repositioned    Home Living                      Prior Function            PT Goals (current goals can now be found in the care plan section) Acute Rehab PT Goals Patient Stated Goal: to feel better Progress towards PT goals: Progressing toward goals    Frequency    Min 2X/week      PT Plan Current plan remains appropriate    Co-evaluation PT/OT/SLP Co-Evaluation/Treatment: Yes Reason for Co-Treatment: Complexity of the patient's impairments (multi-system involvement);To address functional/ADL transfers PT goals addressed during session: Mobility/safety with mobility OT goals addressed during session: ADL's and self-care      AM-PAC PT "6 Clicks" Mobility   Outcome Measure  Help needed turning from your back to your side while in a flat bed without using bedrails?: A Little Help needed moving from lying on your back to sitting on the side of a flat bed without using bedrails?: A Little Help needed moving to and from a bed to a chair (including a wheelchair)?: A Little Help needed standing up from a chair using your arms (e.g., wheelchair or bedside chair)?: A Lot Help needed to walk in hospital room?: Total Help needed climbing 3-5 steps with a railing? : Total 6 Click Score: 13    End of Session Equipment Utilized During Treatment: Gait belt Activity Tolerance: Patient limited by fatigue Patient left: in bed;with call bell/phone within reach;with bed alarm set;with family/visitor present Nurse Communication: Mobility status PT Visit Diagnosis: Repeated falls (R29.6);Muscle weakness (generalized) (M62.81);Difficulty in walking, not elsewhere classified (R26.2);History of falling (Z91.81)     Time: 1062-6948 PT Time  Calculation (min) (ACUTE ONLY): 24 min  Charges:                        [employer], SPT

## 2020-12-30 NOTE — Progress Notes (Signed)
ANTICOAGULATION CONSULT NOTE - Follow Up Consult  Pharmacy Consult for Heparin Infusion Indication: Atrial fibrillation and recent stroke  No Known Allergies  Patient Measurements: Height: 6' (182.9 cm) Weight: 70.8 kg (156 lb 1.4 oz) IBW/kg (Calculated) : 77.6  Vital Signs: Temp: 98.6 F (37 C) (09/21 0704) Temp Source: Oral (09/21 0704) BP: 114/81 (09/21 0704) Pulse Rate: 77 (09/21 0704)  Labs: Recent Labs    12/27/20 1158 12/27/20 2000 12/28/20 0515 12/28/20 1220 12/29/20 0925 12/29/20 1740 12/30/20 0215 12/30/20 0555  HGB  --    < > 8.5*  --  8.4*  --  8.1*  --   HCT  --   --  23.8*  --  24.0*  --  23.6*  --   PLT  --   --  394  --  508*  --  539*  --   APTT 63*  --   --   --   --   --   --   --   HEPARINUNFRC  --    < > 0.16*   < > 0.27* <0.10* 0.46 0.42  CREATININE  --   --  0.62  --   --   --   --   --    < > = values in this interval not displayed.    Estimated Creatinine Clearance: 87.3 mL/min (by C-G formula based on SCr of 0.62 mg/dL).   Medications:  Eliquis 5mg  BID - last dose 9/15 2017  Assessment: 70 year old M with PMH of stage IV small cell lung cancer on chemo, chemo induced pancytopenia, A. fib, chronic hep C, chronic cancer pain, HTN, alcohol abuse, malnutrition and polysubstance use presenting with nausea and vomiting and admitted with neutropenic sepsis due to acute cholecystitis and Klebsiella bacteremia. Pt with acute stroke confirmed on MRI 9/11. Pharmacy has been consulted for heparin dosing/monitoring.   Goal of Therapy:  Heparin level 0.3-0.5 units/ml - confirmed with MD to utilize stroke goals Monitor platelets by anticoagulation protocol: Yes  Date Time HL  9/21  0215  0.46, therapeutic x 1 9/21 0555 0.42, early draw, repeat HL 9/21 1342 0.50, therapeutic x 2   Per protocol, Heparin level can be checked less frequently (daily) after second therapeutic measurement.   Plan:  Continue Heparin infusion rate at 2150 u/hr Recheck  Heparin level in AM CBC daily while on Heparin  Rico Junker Pharmacy Student 12/30/2020,8:16 AM

## 2020-12-30 NOTE — TOC Progression Note (Signed)
Transition of Care [facility]) - Progression Note    Patient Details  Name: Scott Jordan MRN: [id number] Date of Birth: 1950-04-25  Transition of [facility] CM/SW Contact  Beverly Sessions, RN Phone Number: 12/30/2020, 1:44 PM  Clinical Narrative:     Patient with little to no oral intake Patient has a bed for SNF at [facility] at discharge pending authorization Per Palliative note on 9/14 plan for outpatient palliative at [facility]  Expected Discharge Plan: [place] Barriers to Discharge: Continued Medical Work up  Expected Discharge Plan and Services Expected Discharge Plan: [place] In-house Referral: Clinical Social Work   Post Acute Care Choice: [place] Living arrangements for the past 2 months: Single Family Home                                       Social Determinants of Health (SDOH) Interventions    Readmission Risk Interventions No flowsheet data found.

## 2020-12-30 NOTE — Progress Notes (Addendum)
[facility] PROGRESS NOTE    Scott ANDRADA  RXV:[id number] DOB: 06-24-1950 DOA: 12/17/2020 PCP: Earlie Server, MD      Brief Narrative:  Scott Jordan is a 70 y.o. M with stage IV small cell lung cancer on chemo, A. fib not on anticoagulation, chronic hep C, HTN, history of alcohol use and polysubstance abuse who presented with nausea and vomiting, found to have sepsis due to acute cholecystitis and Klebsiella bacteremia.           Assessment & Plan:  Severe sepsis due to Klebsiella pneumonia bacteremia due to cholecystitis Patient admitted and underwent percutaneous drain on 9/11.  Completed 10 days IV Rocephin per ID.  Also completed 9 days Flagyl.   Stage IV small cell lung cancer with metastasis to bone Chemotherapy related pancytopenia Blood lines unchanged. - Consult palliative care  CVA Developed new onset right arm weakness during this hospitalization and MRI brain was performed that showed multifocal acute embolic appearing strokes in the bilateral cerebral hemispheres.  Evaluated by neurology, final recommendations were - Continue aspirin - No TEE given concern for aspiration - No statin given LDL 37 - Outpatient neurology follow-up - PT eval  Paroxysmal atrial fibrillation with RVR -Continue IV heparin  Left pleural effusion Underwent thoracentesis on 9/16 - Continue Lasix and spironolactone  Acute kidney injury Resolved  Polysubstance use disorder  Hyponatremia Resolved  Hypokalemia Hypomagnesemia Resolved  Urinary retention Transient, foley removed.   Severe protein calorie malnutrition -Continue dronabinol, Megace  Bilateral deep tissue injuries, present on arrival              Disposition: Status is: Inpatient  Remains inpatient appropriate because:Unsafe d/c plan  Dispo: The patient is from: Home              Anticipated d/c is to:  TBD              Patient currently is medically stable to d/c.   Difficult to  place patient No       Level of care: Med-Surg       MDM: The below labs and imaging reports were reviewed and summarized above.  Medication management as above.    DVT prophylaxis: Place and maintain sequential compression device Start: 12/18/20 1303 Place TED hose Start: 12/17/20 1523  Code Status: dnr Family Communication:     Consultants:  Pulmonology Oncology Palliative care Neurology Infectious disease Cardiology  Procedures:  9/11-percutaneous biliary drain by IR 9/16-left-sided thoracocentesis with removal of 700 cc fluid    Culture data:  9/8-COVID-19 and influenza PCR nonreactive. 9/8-C. difficile negative. 9/8-blood culture with pansensitive Klebsiella pneumonia except to ampicillin 9/8-urine culture NGTD 9/8-MRSA PCR screen positive 9/13-repeat blood cultures NGTD 9/16-pleural fluid cultures NGTD 9/16-pleural fluid AFS/AFC pending.          Subjective: No fever, headache, chest pain, dyspnea, abdominal pain.  He is tired and has no appetite.  Objective: Vitals:   12/24/20 1428 12/30/20 0305 12/30/20 0704 12/30/20 1652  BP:  117/72 114/81 104/69  Pulse:  96 77 (!) 104  Resp:  17 17 18   Temp:  98.2 F (36.8 C) 98.6 F (37 C) 98.5 F (36.9 C)  TempSrc:  Oral Oral Oral  SpO2:  97% 99% 95%  Weight: 70.8 kg     Height:        Intake/Output Summary (Last 24 hours) at 12/30/2020 1847 Last data filed at 12/30/2020 1815 Gross per 24 hour  Intake 5 ml  Output  625 ml  Net -620 ml   Filed Weights   12/17/20 1203 12/18/20 2130 12/24/20 1428  Weight: 61.7 kg 62.2 kg 70.8 kg    Examination: General appearance: Cachectic appearing elderly adult male, alert and in no obvious distress.  Appears listless. HEENT: Anicteric, conjunctiva pink, lids and lashes normal. No nasal deformity, discharge, epistaxis.  Lips dry, no oral lesions.   Skin: Warm and dry.  no jaundice.  No suspicious rashes or lesions. Cardiac: RRR, nl S1-S2, no  murmurs appreciated.  Capillary refill is brisk.  No JVD, no lower extremity edema. Respiratory: Normal respiratory rate and rhythm.  CTAB without rales or wheezes. Abdomen: Abdomen soft.  No TTP or guarding. No ascites, distension, hepatosplenomegaly.   MSK: No deformities or effusions.  Severe diffuse loss of subcutaneous muscle mass and fat. Neuro: Wake and responds to questions, but listless, tired, generalized but symmetric weakness, speech fluent.    Psych: Sensorium intact and responding to questions, attention diminished, affect flat, judgment insight appear normal      Data Reviewed: I have personally reviewed following labs and imaging studies:  CBC: Recent Labs  Lab 12/24/20 0506 12/25/20 0554 12/26/20 0324 12/27/20 0615 12/28/20 0515 12/29/20 0925 12/30/20 0215  WBC 14.8* 17.2* 14.9* 15.6* 14.5* 14.7* 14.7*  NEUTROABS 9.1* 11.4*  --   --   --   --   --   HGB 10.2* 10.1* 9.1* 8.5* 8.5* 8.4* 8.1*  HCT 28.4* 29.3* 26.3* 24.1* 23.8* 24.0* 23.6*  MCV 99.0 97.7 99.2 98.4 97.5 96.4 97.9  PLT 133* 198 227 310 394 508* 761*   Basic Metabolic Panel: Recent Labs  Lab 12/24/20 0506 12/25/20 0554 12/27/20 0615 12/28/20 0515  NA 142 141 137 136  K 4.1 3.5 3.0* 3.8  CL 112* 112* 108 108  CO2 23 24 25 22   GLUCOSE 94 105* 98 93  BUN 14 14 12 10   CREATININE 0.66 0.71 0.57* 0.62  CALCIUM 7.7* 7.7* 7.3* 7.6*  MG 1.8 1.6* 1.7 1.8  PHOS 2.8 2.8 2.9 2.9   GFR: Estimated Creatinine Clearance: 87.3 mL/min (by C-G formula based on SCr of 0.62 mg/dL). Liver Function Tests: Recent Labs  Lab 12/24/20 0506 12/25/20 0554 12/27/20 0615 12/28/20 0515  AST 28 23  --   --   ALT 78* 63*  --   --   ALKPHOS 61 60  --   --   BILITOT 0.9 1.0  --   --   PROT 5.2* 5.4*  --   --   ALBUMIN 2.1* 2.0* 1.9* 1.9*   No results for input(s): LIPASE, AMYLASE in the last 168 hours. No results for input(s): AMMONIA in the last 168 hours. Coagulation Profile: No results for input(s): INR,  PROTIME in the last 168 hours. Cardiac Enzymes: Recent Labs  Lab 12/25/20 0554  CKTOTAL 62   BNP (last 3 results) No results for input(s): PROBNP in the last 8760 hours. HbA1C: No results for input(s): HGBA1C in the last 72 hours. CBG: No results for input(s): GLUCAP in the last 168 hours. Lipid Profile: No results for input(s): CHOL, HDL, LDLCALC, TRIG, CHOLHDL, LDLDIRECT in the last 72 hours. Thyroid Function Tests: No results for input(s): TSH, T4TOTAL, FREET4, T3FREE, THYROIDAB in the last 72 hours. Anemia Panel: Recent Labs    12/29/20 0925 12/30/20 0555  VITAMINB12  --  5,354*  RETICCTPCT 3.3*  --    Urine analysis:    Component Value Date/Time   COLORURINE AMBER (A) 12/17/2020 1840  APPEARANCEUR TURBID (A) 12/17/2020 1840   LABSPEC 1.030 12/17/2020 1840   PHURINE 5.0 12/17/2020 1840   GLUCOSEU 100 (A) 12/17/2020 1840   HGBUR LARGE (A) 12/17/2020 1840   BILIRUBINUR MODERATE (A) 12/17/2020 1840   KETONESUR TRACE (A) 12/17/2020 1840   PROTEINUR 100 (A) 12/17/2020 1840   NITRITE POSITIVE (A) 12/17/2020 1840   LEUKOCYTESUR NEGATIVE 12/17/2020 1840   Sepsis Labs: @LABRCNTIP (procalcitonin:4,lacticacidven:4)  ) Recent Results (from the past 240 hour(s))  CULTURE, BLOOD (ROUTINE X 2) w Reflex to ID Panel     Status: None   Collection Time: 12/22/20  2:14 PM   Specimen: BLOOD  Result Value Ref Range Status   Specimen Description BLOOD LEFT ANTECUBITAL  Final   Special Requests   Final    BOTTLES DRAWN AEROBIC AND ANAEROBIC Blood Culture adequate volume   Culture   Final    NO GROWTH 5 DAYS Performed at [facility] [address] [place], [place] [address]    Report Status 12/27/2020 FINAL  Final  CULTURE, BLOOD (ROUTINE X 2) w Reflex to ID Panel     Status: None   Collection Time: 12/22/20  2:15 PM   Specimen: BLOOD  Result Value Ref Range Status   Specimen Description BLOOD RIGHT ANTECUBITAL  Final   Special Requests   Final    BOTTLES DRAWN  AEROBIC AND ANAEROBIC Blood Culture adequate volume   Culture   Final    NO GROWTH 5 DAYS Performed at [facility], [address] [place], [place] [address]    Report Status 12/27/2020 FINAL  Final  Acid Fast Smear (AFB)     Status: None   Collection Time: 12/25/20  4:30 PM   Specimen: PATH Cytology Pleural fluid  Result Value Ref Range Status   AFB Specimen Processing Concentration  Final   Acid Fast Smear Negative  Final    Comment: (NOTE) Performed At: [facility] [place], [place] [id number] Rush Farmer MD [id number]    Source (AFB) PLEURAL  Final    Comment: Performed at [facility], [address] [place], [place] [address]  Body fluid culture w Gram Stain     Status: None   Collection Time: 12/25/20  4:30 PM   Specimen: PATH Cytology Pleural fluid  Result Value Ref Range Status   Specimen Description   Final    PLEURAL Performed at [facility], [address] [place], [place] [address]    Special Requests   Final    NONE Performed at [facility], [address] [place], [place] [address]    Gram Stain   Final    RARE WBC PRESENT,BOTH PMN AND MONONUCLEAR NO ORGANISMS SEEN    Culture   Final    NO GROWTH 3 DAYS Performed at [facility], [place] [address], [place], [place] [address]    Report Status 12/29/2020 FINAL  Final  Body fluid culture w Gram Stain     Status: None   Collection Time: 12/25/20  4:49 PM   Specimen: BILE; Body Fluid  Result Value Ref Range Status   Specimen Description   Final    BILE Performed at [facility], [address], [place], [place] [address]    Special Requests   Final    NONE Performed at [facility], [address] [place], [place] [address]    Gram Stain   Final    RARE WBC PRESENT, PREDOMINANTLY MONONUCLEAR NO  ORGANISMS SEEN    Culture   Final    NO GROWTH 3 DAYS Performed at [facility], [place] [address]  [place], [place] [address]    Report Status 12/29/2020 FINAL  Final         Radiology Studies: No results found.      Scheduled Meds:  sodium chloride   Intravenous Once   chlorhexidine  10 mL Mouth/Throat BID   Chlorhexidine Gluconate Cloth  6 each Topical Daily   diltiazem  240 mg Oral Daily   dronabinol  5 mg Oral BID AC   folic acid  1 mg Oral Daily   furosemide  20 mg Oral Daily   guaiFENesin  1,200 mg Oral BID   megestrol  400 mg Oral BID   multivitamin with minerals  1 tablet Oral Daily   sodium chloride flush  10-40 mL Intracatheter Q12H   sodium chloride flush  5 mL Intracatheter Q8H   spironolactone  50 mg Oral Daily   thiamine  100 mg Oral Daily   Continuous Infusions:  sodium chloride Stopped (12/25/20 1558)   sodium chloride 10 mL/hr (12/20/20 1220)   heparin 2,150 Units/hr (12/30/20 0547)     LOS: 13 days    Time spent: 25 minutes    Edwin Dada, MD [facility] 12/30/2020, 6:47 PM     Please page though [place] or Epic secure chat:  For [employer], Adult nurse

## 2020-12-30 NOTE — Progress Notes (Signed)
Pulmonary Medicine          Date: 12/30/2020,   MRN# [id number] Scott Jordan May 15, 1950     AdmissionWeight: 61.7 kg                 CurrentWeight: 70.8 kg  Referring physician: Dr Cyndia Skeeters    CHIEF COMPLAINT:   Acute on chronic hypoxemic respiratory failure   HISTORY OF PRESENT ILLNESS   This is a pleasant male with hx of lung cancer, stage 4 small cell ca, substance abuse, chronic HCV, bony metastasis who is noted to have abnormal chest imaging with complete white out of left lung.    He appears weak and is severely dyspenic during my evaluation bu able to speak slowly.  PCCM consultation for evaluation and management. Pictorial documentation of tis thoracic imaging is included below showing large pleural effusion with associated complete left atelcetasis.   12/27/20- patient feels improved, s/p throacentesis 700 cc removed,  repeat CXR this am with improvement on left.   Atelectasis is still present and he need to have aggressive BPH, will order metaneb with albuterol today to be done with RT twice daily   12/28/20- patient was seen and evaluated at bedside with family present we discussed potential tunneled pleural catheter and patient is imrproved.    12/29/20- patient is stable on 2L/min [place] speaking on phone in full sentences.  Palliative evaluation for possible comfort care. He had thoracentesis but no malignant cells found on cytology. He is feeling better and working with PT/OT.   12/30/20- patient is improved slightly, he was able to participate with PT today and at maximal exertion had very slight desaturation only to 87%.  He asked me to leave after brief examination due to wanting sleep post exercise.  Overall he is with poor prognosis but did reach short term interval improvement.   PAST MEDICAL HISTORY   Past Medical History:  Diagnosis Date  . A-fib ([place]) 01/10/2019   pt st this was dx by Dr. Ubaldo Glassing  . Cancer ([place]) 2021   LUNG  . Complication of anesthesia     DIFFICULTY WAKING UP AFTER SURGERY- 20 YRS AGO  . Hypertension   . Lung cancer ([place])   . Substance abuse ([place])      SURGICAL HISTORY   Past Surgical History:  Procedure Laterality Date  . ARM DEBRIDEMENT Left    INCISION AND DEBRIDEMENT LOWER ARM -20 YRS AGO  . BACK SURGERY    . IR PERC CHOLECYSTOSTOMY  12/20/2020  . PORTACATH PLACEMENT Left 08/29/2019   Procedure: INSERTION PORT-A-CATH;  Surgeon: Nestor Lewandowsky, MD;  Location: [facility] ORS;  Service: General;  Laterality: Left;     FAMILY HISTORY   History reviewed. No pertinent family history.   SOCIAL HISTORY   Social History   Tobacco Use  . Smoking status: Former    Packs/day: 0.50    Types: Cigarettes  . Smokeless tobacco: Never  . Tobacco comments:    not smoked since 6 weeks ago  Vaping Use  . Vaping Use: Never used  Substance Use Topics  . Alcohol use: Yes    Comment: 84 OZ IN WEEK  . Drug use: Not Currently    Types: Heroin    Comment: heroin WITHIN PAST YEAR     MEDICATIONS    Home Medication:    Current Medication:  Current Facility-Administered Medications:  .  0.9 %  sodium chloride infusion (Manually program via Guardrails IV Fluids), , Intravenous, Once,  Sharen Hones, MD .  0.9 %  sodium chloride infusion, , Intravenous, PRN, Sharen Hones, MD, Stopped at 12/25/20 1558 .  0.9 %  sodium chloride infusion, , , Continuous PRN, Criselda Peaches, MD, Last Rate: 10 mL/hr at 12/20/20 1220, 10 mL/hr at 12/20/20 1220 .  acetaminophen (TYLENOL) tablet 650 mg, 650 mg, Oral, Q6H PRN **OR** acetaminophen (TYLENOL) suppository 650 mg, 650 mg, Rectal, Q6H PRN, Cox, Amy N, DO .  albuterol (PROVENTIL) (2.5 MG/3ML) 0.083% nebulizer solution 2.5 mg, 2.5 mg, Nebulization, Q6H PRN, Lanney Gins, Lucienne Sawyers, MD .  chlorhexidine (PERIDEX) 0.12 % solution 10 mL, 10 mL, Mouth/Throat, BID, Earlie Server, MD, 10 mL at 12/28/20 2231 .  Chlorhexidine Gluconate Cloth 2 % PADS 6 each, 6 each, Topical, Daily, Cox, Amy N, DO, 6 each at  12/29/20 1649 .  diltiazem (CARDIZEM CD) 24 hr capsule 240 mg, 240 mg, Oral, Daily, Cox, Amy N, DO, 240 mg at 12/29/20 1650 .  diltiazem (CARDIZEM) tablet 30 mg, 30 mg, Oral, Q6H PRN, Cyndia Skeeters, Taye T, MD .  dronabinol (MARINOL) capsule 5 mg, 5 mg, Oral, BID AC, Earlie Server, MD, 5 mg at 12/29/20 1649 .  folic acid (FOLVITE) tablet 1 mg, 1 mg, Oral, Daily, Sharen Hones, MD, 1 mg at 12/28/20 1501 .  furosemide (LASIX) tablet 20 mg, 20 mg, Oral, Daily, Roseline Ebarb, MD, 20 mg at 12/29/20 1650 .  guaiFENesin (MUCINEX) 12 hr tablet 1,200 mg, 1,200 mg, Oral, BID, Dorothe Pea, RPH, 1,200 mg at 12/29/20 2308 .  heparin ADULT infusion 100 units/mL (25000 units/216mL), 2,150 Units/hr, Intravenous, Continuous, Rauer, Forde Dandy, RPH, Last Rate: 21.5 mL/hr at 12/30/20 0547, 2,150 Units/hr at 12/30/20 0547 .  megestrol (MEGACE) 400 MG/10ML suspension 400 mg, 400 mg, Oral, BID, Julis Haubner, MD, 400 mg at 12/29/20 2308 .  multivitamin with minerals tablet 1 tablet, 1 tablet, Oral, Daily, Sharen Hones, MD, 1 tablet at 12/28/20 1502 .  ondansetron (ZOFRAN) tablet 4 mg, 4 mg, Oral, Q6H PRN **OR** ondansetron (ZOFRAN) injection 4 mg, 4 mg, Intravenous, Q6H PRN, Cox, Amy N, DO .  oxyCODONE-acetaminophen (PERCOCET/ROXICET) 5-325 MG per tablet 1 tablet, 1 tablet, Oral, Q6H PRN, Mansy, Jan A, MD, 1 tablet at 12/24/20 0944 .  sodium chloride flush (NS) 0.9 % injection 10-40 mL, 10-40 mL, Intracatheter, Q12H, Max Sane, MD, 10 mL at 12/29/20 2309 .  sodium chloride flush (NS) 0.9 % injection 10-40 mL, 10-40 mL, Intracatheter, PRN, Manuella Ghazi, Vipul, MD .  sodium chloride flush (NS) 0.9 % injection 5 mL, 5 mL, Intracatheter, Q8H, Criselda Peaches, MD, 5 mL at 12/30/20 0532 .  spironolactone (ALDACTONE) tablet 50 mg, 50 mg, Oral, Daily, Ottie Glazier, MD, 50 mg at 12/29/20 2013 .  thiamine tablet 100 mg, 100 mg, Oral, Daily, Sharen Hones, MD, 100 mg at 12/28/20 1501  Facility-Administered Medications Ordered in Other  Encounters:  .  sodium chloride flush (NS) 0.9 % injection 10 mL, 10 mL, Intravenous, PRN, Mike Gip, Melissa C, MD, 10 mL at 11/11/19 0962    ALLERGIES   Patient has no known allergies.     REVIEW OF SYSTEMS    Review of Systems:  Gen:  Denies  fever, sweats, chills weigh loss  HEENT: Denies blurred vision, double vision, ear pain, eye pain, hearing loss, nose bleeds, sore throat Cardiac:  No dizziness, chest pain or heaviness, chest tightness,edema Resp:   Denies cough or sputum porduction, shortness of breath,wheezing, hemoptysis,  Gi: Denies swallowing difficulty, stomach pain, nausea or vomiting, diarrhea,  constipation, bowel incontinence Gu:  Denies bladder incontinence, burning urine Ext:   Denies Joint pain, stiffness or swelling Skin: Denies  skin rash, easy bruising or bleeding or hives Endoc:  Denies polyuria, polydipsia , polyphagia or weight change Psych:   Denies depression, insomnia or hallucinations   Other:  All other systems negative   VS: BP 114/81 (BP Location: Right Arm)   Pulse 77   Temp 98.6 F (37 C) (Oral)   Resp 17   Ht 6' (1.829 m)   Wt 70.8 kg   SpO2 99%   BMI 21.17 kg/m      PHYSICAL EXAM    GENERAL:NAD, no fevers, chills, no weakness no fatigue HEAD: Normocephalic, atraumatic.  EYES: Pupils equal, round, reactive to light. Extraocular muscles intact. No scleral icterus.  MOUTH: Moist mucosal membrane. Dentition intact. No abscess noted.  EAR, NOSE, THROAT: Clear without exudates. No external lesions.  NECK: Supple. No thyromegaly. No nodules. No JVD.  PULMONARY: Decreased BS on left CARDIOVASCULAR: S1 and S2. Regular rate and rhythm. No murmurs, rubs, or gallops. No edema. Pedal pulses 2+ bilaterally.  GASTROINTESTINAL: Soft, nontender, nondistended. No masses. Positive bowel sounds. No hepatosplenomegaly.  MUSCULOSKELETAL: No swelling, clubbing, or edema. Range of motion full in all extremities.  NEUROLOGIC: Cranial nerves II  through XII are intact. No gross focal neurological deficits. Sensation intact. Reflexes intact.  SKIN: No ulceration, lesions, rashes, or cyanosis. Skin warm and dry. Turgor intact.  PSYCHIATRIC: Mood, affect within normal limits. The patient is awake, alert and oriented x 3. Insight, judgment intact.       IMAGING    CT ABDOMEN PELVIS WO CONTRAST  Result Date: 12/17/2020 CLINICAL DATA:  Nausea and vomiting EXAM: CT ABDOMEN AND PELVIS WITHOUT CONTRAST TECHNIQUE: Multidetector CT imaging of the abdomen and pelvis was performed following the standard protocol without IV contrast. COMPARISON:  CT 06/23/2020, PET CT 11/19/2020 FINDINGS: Lower chest: Lung bases demonstrate emphysema. Subpleural bandlike airspace disease at the left lower lobe redemonstrated. Incompletely visualized subpleural peripheral left upper lobe nodule measuring 11 mm, series 5, image 1. Cardiomegaly with trace pericardial effusion. Coronary vascular calcification. Hepatobiliary: Extensive motion degradation. No focal hepatic abnormality. Gallstones. Gallbladder slightly distended. Possible wall thickening or pericholecystic fluid though limited by motion Pancreas: Unremarkable. No pancreatic ductal dilatation or surrounding inflammatory changes. Spleen: Normal in size without focal abnormality. Adrenals/Urinary Tract: Thickened appearance of adrenal glands. No hydronephrosis. The bladder is unremarkable Stomach/Bowel: Stomach nonenlarged. No dilated small bowel. Negative appendix. No acute bowel wall thickening Vascular/Lymphatic: Advanced aortic atherosclerosis. No aneurysm. Slightly increased multiple small retroperitoneal lymph nodes. Reproductive: Prostate is unremarkable. Other: Negative for free air or free fluid Musculoskeletal: No acute osseous abnormality. IMPRESSION: 1. Degraded by motion. 2. Gallbladder appears slightly distended and there are gallstones. Possible gallbladder wall thickening or pericholecystic fluid though  limited by motion, consider correlation with ultrasound 3. Cardiomegaly with trace pericardial effusion. 4. Similar subpleural bandlike density at left lower lobe. Incompletely visualized peripheral left upper lobe lung nodule characterized on recent PET CT Electronically Signed   By: Donavan Foil M.D.   On: 12/17/2020 16:50   CT ANGIO HEAD NECK W WO CM  Result Date: 12/21/2020 CLINICAL DATA:  Neuro deficit, acute, stroke suspected. Neutropenic sepsis. EXAM: CT ANGIOGRAPHY HEAD AND NECK TECHNIQUE: Multidetector CT imaging of the head and neck was performed using the standard protocol during bolus administration of intravenous contrast. Multiplanar CT image reconstructions and MIPs were obtained to evaluate the vascular anatomy. Carotid stenosis measurements (when  applicable) are obtained utilizing NASCET criteria, using the distal internal carotid diameter as the denominator. CONTRAST:  34mL OMNIPAQUE IOHEXOL 350 MG/ML SOLN COMPARISON:  MRI of the brain December 20, 2020. FINDINGS: CT HEAD FINDINGS Brain: Confluent hypodensity of the white matter of the cerebral hemispheres, nonspecific, most likely related to chronic small vessel ischemia. Foci of acute infarct are more conspicuous on recent MRI of the brain. No new large territorial infarct. No hemorrhage, hydrocephalus, mass or midline shift. Vascular: No hyperdense vessel or unexpected calcification. Skull: Normal. Negative for fracture or focal lesion. Sinuses: Increased thickness of the walls of the bilateral maxillary sinuses with bubbly secretion on the left. Findings are suggestive of chronic sinusitis. Right mastoid effusion. Orbits: No acute finding. Review of the MIP images confirms the above findings CTA NECK FINDINGS Aortic arch: Standard branching. Imaged portion shows no evidence of aneurysm or dissection. Mild atherosclerotic changes of the aortic arch without significant stenosis of the major arch vessel origins. Right carotid system:  Calcified atherosclerotic disease of the right carotid bifurcation without hemodynamically significant stenosis. Left carotid system: Atherosclerotic disease of the left carotid bifurcation with mixed density plaques without hemodynamically significant stenosis. Vertebral arteries: Calcified atherosclerotic plaque at the origin of the right vertebral artery without hemodynamically significant stenosis. Mild atherosclerotic changes at the origin of the left vertebral artery without hemodynamically significant stenosis. Cervical vertebral arteries otherwise have normal caliber Skeleton: Degenerative changes of the cervical spine with fusion of the facet joints at C2-3 on the left. No acute findings. Other neck: Negative. Upper chest: Left-sided pleural effusion and atelectasis. Mediastinal lymphadenopathy. Review of the MIP images confirms the above findings CTA HEAD FINDINGS Anterior circulation: Calcified atherosclerotic plaques in the bilateral carotid siphons. No significant stenosis, proximal occlusion, aneurysm, or vascular malformation. Posterior circulation: No significant stenosis, proximal occlusion, aneurysm, or vascular malformation. Hypoplastic/aplastic right P1/PCA segment with right fetal PCA. Venous sinuses: As permitted by contrast timing, patent. Anatomic variants: Right fetal PCA. Review of the MIP images confirms the above findings IMPRESSION: 1. Advanced chronic microvascular ischemic changes of the white matter. 2. Atherosclerotic disease of the bilateral carotid bifurcation without hemodynamically significant stenosis. 3. Mild atherosclerotic changes at the origin of the bilateral vertebral arteries without hemodynamically significant stenosis. 4. Mild intracranial atherosclerotic disease without hemodynamically significant stenosis. Electronically Signed   By: Pedro Earls M.D.   On: 12/21/2020 15:32   DG Chest 1 View  Result Date: 12/25/2020 CLINICAL DATA:  Thoracentesis.   Lung cancer EXAM: CHEST  1 VIEW COMPARISON:  Radiograph 12/25/2020 FINDINGS: Interval reduction in LEFT pleural fluid following thoracentesis. No pneumothorax appreciated. A small basilar effusion remains. RIGHT lung clear. Port in the anterior chest wall with tip in distal SVC. IMPRESSION: 1. No pneumothorax appreciated following LEFT thoracentesis. 2. Considerable reduction in LEFT pleural fluid. Electronically Signed   By: Suzy Bouchard M.D.   On: 12/25/2020 17:28   DG Chest 2 View  Result Date: 12/27/2020 CLINICAL DATA:  Status post thoracentesis 2 days prior. EXAM: CHEST - 2 VIEW COMPARISON:  Radiograph same day (12/27/2020 at 830 hours FINDINGS: Persistent near complete opacification of the LEFT hemithorax. Small amount of aerated lung remains at LEFT lung apex. Port in place. RIGHT lung clear. IMPRESSION: No interval change in near complete opacification LEFT hemithorax. Electronically Signed   By: Suzy Bouchard M.D.   On: 12/27/2020 18:17   CT HEAD WO CONTRAST (5MM)  Result Date: 12/17/2020 CLINICAL DATA:  Nausea vomiting EXAM: CT HEAD WITHOUT CONTRAST TECHNIQUE:  Contiguous axial images were obtained from the base of the skull through the vertex without intravenous contrast. COMPARISON:  CT brain 06/24/2020, MRI 03/10/2020, head CT 11/22/2019 FINDINGS: Brain: No acute territorial infarction, hemorrhage or intracranial mass. Extensive white matter disease, progressed from more remote exams from 2021. Stable ventricle size. Chronic lacunar infarcts within the left white matter. Vascular: No hyperdense vessel.  Carotid vascular calcification Skull: Normal. Negative for fracture or focal lesion. Sinuses/Orbits: Mucosal thickening the sinuses. Chronic appearing nasal deformity Other: None IMPRESSION: 1. No definite CT evidence for acute intracranial abnormality. 2. Atrophy. Extensive white matter disease, grossly stable as compared with recent head CT from March, appears progressive when compared to  exams from 2021 and prior Electronically Signed   By: Donavan Foil M.D.   On: 12/17/2020 16:39   MR BRAIN W WO CONTRAST  Addendum Date: 12/21/2020   ADDENDUM REPORT: 12/21/2020 14:49 ADDENDUM: Patient returns for repeat postcontrast imaging. There is no abnormal enhancement. No evidence of intracranial metastatic disease. Electronically Signed   By: Macy Mis M.D.   On: 12/21/2020 14:49   Result Date: 12/21/2020 CLINICAL DATA:  Neuro deficit, acute, stroke suspected Small cell lung cancer, monitor EXAM: MRI HEAD WITHOUT AND WITH CONTRAST TECHNIQUE: Multiplanar, multiecho pulse sequences of the brain and surrounding structures were obtained without and with intravenous contrast. CONTRAST:  7.90mL GADAVIST GADOBUTROL 1 MMOL/ML IV SOLN COMPARISON:  CT head 12/17/2020. FINDINGS: Mildly motion limited exam.  Within this limitation: Brain: Multiple small areas of restricted diffusion in the bilateral frontal and occipital, and left parietal matter, left perirolandic cortex, left thalamocapsular region, and left cerebellum. Advanced confluent T2 hyperintensity within the white matter, nonspecific but compatible with chronic microvascular ischemic disease and progressed since 2021. Small remote infarct in the left corona radiata. No hydrocephalus, obvious mass lesion, midline shift, acute hemorrhage, or extra-axial fluid collection. Punctate foci of susceptibility artifact in the left cerebellum and left frontal white matter, nonspecific but potentially related to prior microhemorrhage. Contrast was administered; however, there is no evidence of enhancement (for example, the nasal mucosa/turbinates are nonenhancing). This precludes evaluation for enhancing lesions. Vascular: Major arterial flow voids are maintained at the skull base. Skull and upper cervical spine: Normal marrow signal. Sinuses/Orbits: Mild paranasal sinus disease. Other: Moderate right and small left mastoid effusions. IMPRESSION: 1. Multiple  small areas of restricted diffusion in the bilateral frontal and occipital and left parietal white matter, left perirolandic cortex, left thalamocapsular region, and left cerebellum, which are concerning for acute infarcts. Given involvement of multiple vascular territories, consider a central embolic etiology. 2. Contrast was administered; however, there is no evidence of enhancement, unclear why given reported uneventful injection per the technologist. This precludes evaluation for enhancing lesions. Given the patient's known small cell lung cancer diagnosis, recommend repeat postcontrast imaging to exclude metastatic disease. I've asked the MRI technologist to not bill the patient for the repeat post-contrast imaging. 3. Advanced chronic microvascular ischemic disease, progressed since 2021. 4. Moderate right and small left mastoid effusions. Electronically Signed: By: Margaretha Sheffield M.D. On: 12/20/2020 14:29   IR Perc Cholecystostomy  Result Date: 12/20/2020 INDICATION: 70 year old male with small cell lung cancer and chemotherapy induced pancytopenia presenting with neutropenic sepsis. Blood cultures grew out Klebsiella and imaging findings are concerning for acute cholecystitis. He is too sick and frail to be considered an operative candidate. Following transfusion of platelets, his platelets are now at an acceptable level to proceed with percutaneous cholecystostomy tube placement. EXAM: CHOLECYSTOSTOMY MEDICATIONS: In patient  currently receiving intravenous antibiotic therapy. No additional antibiotic prophylaxis administered. ANESTHESIA/SEDATION: Moderate (conscious) sedation was employed during this procedure. A total of Versed 1 mg and Fentanyl 50 mcg and 0.5 mg Dilaudid administered intravenously. Moderate Sedation Time: 13 minutes. The patient's level of consciousness and vital signs were monitored continuously by radiology nursing throughout the procedure under my direct supervision. FLUOROSCOPY  TIME:  Fluoroscopy Time: 0 minutes 30 seconds (4.3 mGy). COMPLICATIONS: None immediate. PROCEDURE: Informed written consent was obtained from the patient after a thorough discussion of the procedural risks, benefits and alternatives. All questions were addressed. Maximal Sterile Barrier Technique was utilized including caps, mask, sterile gowns, sterile gloves, sterile drape, hand hygiene and skin antiseptic. A timeout was performed prior to the initiation of the procedure. The right upper quadrant was interrogated with ultrasound. The distended gallbladder with a thickened wall was successfully identified. There is significant excursion of the liver with respiration due to use of abdominal muscles. A suitable skin entry site was selected. Local anesthesia was attained by infiltration with 1% lidocaine. A small dermatotomy was made. Under real-time ultrasound guidance, the movement of the liver was timed with respirations and in a single pass, a 21 gauge Accustick needle was advanced through the liver and into the gallbladder lumen along a short transhepatic course. A 0.018 wire was quickly advanced into the gallbladder lumen and the needle was exchanged for the transitional dilator. The transitional dilator was advanced into the gallbladder lumen. The wire was removed. Contrast injection was performed under fluoroscopy confirming opacification of the gallbladder lumen. Multiple filling defects consistent with cholelithiasis. The cystic duct is occluded. A 0.035 wire was then coiled within the gallbladder lumen. The transitional dilator was removed. The transhepatic tract was dilated to 10 [place] with a stiff dilator. A Cook 10.2 [place] all-purpose drainage catheter was then advanced over the wire and formed in the gallbladder lumen. Images were obtained and stored for the medical record. The catheter was gently flushed and connected to gravity bag drainage before being secured to the skin with 0 silk suture.  IMPRESSION: Successful placement of 10 French transhepatic percutaneous cholecystostomy tube for an indication of calculus cholecystitis. PLAN: Maintain tube to gravity bag drainage. Patient will likely need home health to assist with care and cleaning of the tube site. Return to interventional radiology approximately every 8 weeks for cholecystostomy tube check and exchange. If patient's clinical status improves in the future, elective cholecystectomy would be optimal. If patient remains a poor operative candidate, long-term cholecystostomy tube maintenance will likely be required. Electronically Signed   By: Jacqulynn Cadet M.D.   On: 12/20/2020 13:50   DG Chest Port 1 View  Result Date: 12/27/2020 CLINICAL DATA:  Shortness of breath with atelectasis common thoracentesis 2 days ago EXAM: PORTABLE CHEST 1 VIEW COMPARISON:  Chest radiograph 12/25/2020 FINDINGS: The left chest wall port is in stable position with the tip terminating in the region of the right atrium. The cardiomediastinal silhouette can not be assessed. The left pleural effusion has increased in size with worsening aeration of the left upper lobe. Aeration of the right lung is not significantly changed. There is no significant right effusion. There is no appreciable pneumothorax. There is no acute osseous abnormality. Enteric contrast is noted in the colon. IMPRESSION: Increased size of the large left pleural effusion with worsened aeration of the left lung. Electronically Signed   By: Valetta Mole M.D.   On: 12/27/2020 09:12   DG Chest [facility] 1 View  Result  Date: 12/25/2020 CLINICAL DATA:  Shortness of breath EXAM: PORTABLE CHEST 1 VIEW COMPARISON:  Chest x-ray 12/17/2020, PET CT 11/19/2020 FINDINGS: Left-sided central venous port tip over right atrium. Interval complete opacification of left thorax, suspect that there is at least moderate pleural effusion. Cardiomediastinal silhouette is obscured. No pneumothorax is visualized. IMPRESSION:  Interval opacification of the entire left thorax which may be due to combination of pleural effusion/atelectasis/airspace disease. Electronically Signed   By: Donavan Foil M.D.   On: 12/25/2020 15:19   DG Chest Port 1 View  Result Date: 12/17/2020 CLINICAL DATA:  Questionable sepsis.  Weakness EXAM: PORTABLE CHEST 1 VIEW COMPARISON:  03/09/2020 FINDINGS: Left subclavian approached chest port remains in place. Stable cardiomegaly. Chronic left basilar scarring or atelectasis. Peripheral left upper lobe nodule better seen on prior CT. No new airspace consolidation. Pleural effusion or pneumothorax. IMPRESSION: Chronic left basilar scarring or atelectasis. No new or acute findings. Electronically Signed   By: Davina Poke D.O.   On: 12/17/2020 13:20   DG Swallowing Func-Speech Pathology  Result Date: 12/25/2020 Table formatting from the original result was not included. Objective Swallowing Evaluation: Type of Study: MBS-Modified Barium Swallow Study  Patient Details Name: LORANCE PICKERAL MRN: [id number] Date of Birth: 1950/11/24 Today's Date: 12/25/2020 Time: SLP Start Time (ACUTE ONLY): 62 -SLP Stop Time (ACUTE ONLY): 1300 SLP Time Calculation (min) (ACUTE ONLY): 30 min Past Medical History: Past Medical History: Diagnosis Date . A-fib ([place]) 01/10/2019  pt st this was dx by Dr. Ubaldo Glassing . Cancer ([place]) 2021  LUNG . Complication of anesthesia   DIFFICULTY WAKING UP AFTER SURGERY- 20 YRS AGO . Hypertension  . Lung cancer ([place])  . Substance abuse [facility])  Past Surgical History: Past Surgical History: Procedure Laterality Date . ARM DEBRIDEMENT Left   INCISION AND DEBRIDEMENT LOWER ARM -20 YRS AGO . BACK SURGERY   . IR PERC CHOLECYSTOSTOMY  12/20/2020 . PORTACATH PLACEMENT Left 08/29/2019  Procedure: INSERTION PORT-A-CATH;  Surgeon: Nestor Lewandowsky, MD;  Location: [facility] ORS;  Service: General;  Laterality: Left; HPI: Pt is a 70 year old male with PMH of metastatic stage IV small cell lung cancer on chemo, chemo induced  pancytopenia, A. fib not on AC, chronic hep C, chronic cancer pain, HTN, alcohol abuse, malnutrition and polysubstance who is admitted with neutropenic sepsis due to acute cholecystitis and Klebsiella bacteremia now s/p cholecystostomy on 9/11 by IR. Hospital admission complicated by acute thromboembolic stroke with new right-sided weakness-confirmed on MRI brain on 12/20/2020.  MRI: Multiple small areas of restricted diffusion in the bilateral  frontal and occipital and left parietal white matter, left  perirolandic cortex, left thalamocapsular region, and left  cerebellum, which are concerning for acute infarcts.  CXR at admit: Chronic left basilar scarring or atelectasis. No new or acute  findings.  Pt is followed by Palliative Care at [facility].  Subjective: pt able to communicate his wants/needs Assessment / Plan / Recommendation CHL IP CLINICAL IMPRESSIONS 12/25/2020 Clinical Impression Pt presents with adequate airway protection while consuming thin liquids, nectar thick liquids, puree and whole barium tablet with thin liquids. Pt's oral phase is unremarkable and his swallow response is timely with good airway protection. His overall medical comorbidities and respiratory decline place him at an overall increased risk of aspiration. For example, when consuming consecutive sips of thin liquids, pt became short of breath. At this time, recommend pt continue consuming dysphagia 3 diet for energy conservation with thin liquids, medicine whole with thin liquids. Pt needs to follow  strict aspiration precautions such as taking single sips and rest breaks. SLP Visit Diagnosis Dysphagia, oropharyngeal phase (R13.12) Attention and concentration deficit following -- Frontal lobe and executive function deficit following -- Impact on safety and function Mild aspiration risk;Moderate aspiration risk   CHL IP TREATMENT RECOMMENDATION 12/25/2020 Treatment Recommendations No treatment recommended at this time   Prognosis  12/24/2020 Prognosis for Safe Diet Advancement Fair Barriers to Reach Goals Motivation;Time post onset;Severity of deficits;Behavior Barriers/Prognosis Comment -- CHL IP DIET RECOMMENDATION 12/25/2020 SLP Diet Recommendations Dysphagia 3 (Mech soft) solids;Thin liquid Liquid Administration via Cup Medication Administration Whole meds with liquid Compensations Minimize environmental distractions;Slow rate;Small sips/bites Postural Changes Remain semi-upright after after feeds/meals (Comment);Seated upright at 90 degrees   CHL IP OTHER RECOMMENDATIONS 12/25/2020 Recommended Consults -- Oral Care Recommendations Oral care BID Other Recommendations --   CHL IP FOLLOW UP RECOMMENDATIONS 12/25/2020 Follow up Recommendations None   CHL IP FREQUENCY AND DURATION 12/24/2020 Speech Therapy Frequency (ACUTE ONLY) min 3x week Treatment Duration 2 weeks      CHL IP ORAL PHASE 12/25/2020 Oral Phase WFL Oral - Pudding Teaspoon -- Oral - Pudding Cup -- Oral - Honey Teaspoon -- Oral - Honey Cup -- Oral - Nectar Teaspoon -- Oral - Nectar Cup -- Oral - Nectar Straw -- Oral - Thin Teaspoon -- Oral - Thin Cup -- Oral - Thin Straw -- Oral - Puree -- Oral - Mech Soft -- Oral - Regular -- Oral - Multi-Consistency -- Oral - Pill -- Oral Phase - Comment --  CHL IP PHARYNGEAL PHASE 12/25/2020 Pharyngeal Phase WFL Pharyngeal- Pudding Teaspoon -- Pharyngeal -- Pharyngeal- Pudding Cup -- Pharyngeal -- Pharyngeal- Honey Teaspoon -- Pharyngeal -- Pharyngeal- Honey Cup -- Pharyngeal -- Pharyngeal- Nectar Teaspoon -- Pharyngeal -- Pharyngeal- Nectar Cup -- Pharyngeal -- Pharyngeal- Nectar Straw -- Pharyngeal -- Pharyngeal- Thin Teaspoon -- Pharyngeal -- Pharyngeal- Thin Cup -- Pharyngeal -- Pharyngeal- Thin Straw -- Pharyngeal -- Pharyngeal- Puree -- Pharyngeal -- Pharyngeal- Mechanical Soft -- Pharyngeal -- Pharyngeal- Regular -- Pharyngeal -- Pharyngeal- Multi-consistency -- Pharyngeal -- Pharyngeal- Pill -- Pharyngeal -- Pharyngeal Comment --  CHL IP  CERVICAL ESOPHAGEAL PHASE 12/25/2020 Cervical Esophageal Phase WFL Pudding Teaspoon -- Pudding Cup -- Honey Teaspoon -- Honey Cup -- Nectar Teaspoon -- Nectar Cup -- Nectar Straw -- Thin Teaspoon -- Thin Cup -- Thin Straw -- Puree -- Mechanical Soft -- Regular -- Multi-consistency -- Pill -- Cervical Esophageal Comment -- Happi B. [name] M.S., CCC-SLP, [place] Office [phone] Stormy Fabian 12/25/2020, 2:17 PM              ECHOCARDIOGRAM COMPLETE  Result Date: 12/21/2020    ECHOCARDIOGRAM REPORT   Patient Name:   Scott Jordan Date of Exam: 12/21/2020 Medical Rec #:  [id number]    Height:       72.0 in Accession #:    [id number]   Weight:       137.1 lb Date of Birth:  26-Jun-1950     BSA:          1.815 m Patient Age:    21 years     BP:           114/80 mmHg Patient Gender: M            HR:           89 bpm. Exam Location:  [facility] Procedure: 2D Echo, Color Doppler and Cardiac Doppler Indications:     Endocarditis I38  History:  Patient has prior history of Echocardiogram examinations, most                  recent 08/19/2019. Arrythmias:Atrial Fibrillation; Risk                  Factors:Hypertension. Substance abuse.  Sonographer:     Sherrie Sport Referring Phys:  [place] Diagnosing Phys: Serafina Royals MD IMPRESSIONS  1. Left ventricular ejection fraction, by estimation, is 60 to 65%. The left ventricle has normal function. The left ventricle has no regional wall motion abnormalities. Left ventricular diastolic parameters were normal.  2. Right ventricular systolic function is normal. The right ventricular size is normal.  3. The mitral valve is normal in structure. Trivial mitral valve regurgitation.  4. The aortic valve is normal in structure. Aortic valve regurgitation is not visualized. Mild aortic valve sclerosis is present, with no evidence of aortic valve stenosis. Conclusion(s)/Recommendation(s): No evidence of valve vegetations. FINDINGS  Left  Ventricle: Left ventricular ejection fraction, by estimation, is 60 to 65%. The left ventricle has normal function. The left ventricle has no regional wall motion abnormalities. The left ventricular internal cavity size was small. There is no left ventricular hypertrophy. Left ventricular diastolic parameters were normal. Right Ventricle: The right ventricular size is normal. No increase in right ventricular wall thickness. Right ventricular systolic function is normal. Left Atrium: Left atrial size was normal in size. Right Atrium: Right atrial size was normal in size. Pericardium: There is no evidence of pericardial effusion. Mitral Valve: The mitral valve is normal in structure. Trivial mitral valve regurgitation. Tricuspid Valve: The tricuspid valve is normal in structure. Tricuspid valve regurgitation is trivial. Aortic Valve: The aortic valve is normal in structure. Aortic valve regurgitation is not visualized. Mild aortic valve sclerosis is present, with no evidence of aortic valve stenosis. Aortic valve mean gradient measures 3.0 mmHg. Aortic valve peak gradient measures 5.9 mmHg. Aortic valve area, by VTI measures 2.30 cm. Pulmonic Valve: The pulmonic valve was normal in structure. Pulmonic valve regurgitation is not visualized. Aorta: The aortic root and ascending aorta are structurally normal, with no evidence of dilitation. IAS/Shunts: No atrial level shunt detected by color flow Doppler.  LEFT VENTRICLE PLAX 2D LVIDd:         3.87 cm LVIDs:         2.30 cm LV PW:         1.00 cm LV IVS:        1.18 cm LVOT diam:     2.10 cm LV SV:         44 LV SV Index:   24 LVOT Area:     3.46 cm  RIGHT VENTRICLE RV Basal diam:  3.70 cm RV S prime:     12.40 cm/s TAPSE (M-mode): 3.7 cm LEFT ATRIUM           Index       RIGHT ATRIUM           Index LA diam:      3.50 cm 1.93 cm/m  RA Area:     33.70 cm LA Vol (A2C): 57.4 ml 31.63 ml/m RA Volume:   128.00 ml 70.53 ml/m LA Vol (A4C): 72.1 ml 39.73 ml/m  AORTIC  VALVE                   PULMONIC VALVE AV Area (Vmax):    2.34 cm    PV Vmax:  0.60 m/s AV Area (Vmean):   2.32 cm    PV Peak grad:   1.4 mmHg AV Area (VTI):     2.30 cm    RVOT Peak grad: 2 mmHg AV Vmax:           121.00 cm/s AV Vmean:          86.000 cm/s AV VTI:            0.193 m AV Peak Grad:      5.9 mmHg AV Mean Grad:      3.0 mmHg LVOT Vmax:         81.60 cm/s LVOT Vmean:        57.600 cm/s LVOT VTI:          0.128 m LVOT/AV VTI ratio: 0.66  AORTA Ao Root diam: 3.50 cm MITRAL VALVE               TRICUSPID VALVE MV Area (PHT): 4.52 cm    TR Peak grad:   24.4 mmHg MV Decel Time: 168 msec    TR Vmax:        247.00 cm/s MV E velocity: 88.30 cm/s                            SHUNTS                            Systemic VTI:  0.13 m                            Systemic Diam: 2.10 cm Serafina Royals MD Electronically signed by Serafina Royals MD Signature Date/Time: 12/21/2020/5:20:09 PM    Final    US Abdomen Limited RUQ (LIVER/GB)  Result Date: 12/18/2020 CLINICAL DATA:  Evaluate for cholecystitis. EXAM: ULTRASOUND ABDOMEN LIMITED RIGHT UPPER QUADRANT COMPARISON:  CT AP from 12/17/2020 FINDINGS: Gallbladder: The gallbladder wall is diffusely edematous measuring up to 13.1 mm. Gallstones are noted measuring up to 5.9 mm. Pericholecystic fluid. Positive sonographic Murphy's sign. Common bile duct: Diameter: 6 mm Liver: Diffusely increased parenchymal echogenicity identified. No focal liver abnormality. Portal vein is patent on color Doppler imaging with normal direction of blood flow towards the liver. Other: 1.9 cm right kidney cyst. IMPRESSION: Gallstones, gallbladder wall thickening and pericholecystic fluid with positive sonographic Murphy's sign. Findings are compatible with acute cholecystitis. Electronically Signed   By: Kerby Moors M.D.   On: 12/18/2020 14:18   US THORACENTESIS ASP PLEURAL SPACE W/IMG GUIDE  Result Date: 12/25/2020 INDICATION: Large left pleural effusion secondary to known lung  cancer. Request for diagnostic and therapeutic thoracentesis. EXAM: ULTRASOUND GUIDED LEFT THORACENTESIS MEDICATIONS: 1% lidocaine 10 mL COMPLICATIONS: None immediate. PROCEDURE: An ultrasound guided thoracentesis was thoroughly discussed with the patient and questions answered. The benefits, risks, alternatives and complications were also discussed. The patient understands and wishes to proceed with the procedure. Written consent was obtained. Ultrasound was performed to localize and mark an adequate pocket of fluid in the left chest. The area was then prepped and draped in the normal sterile fashion. 1% Lidocaine was used for local anesthesia. Under ultrasound guidance a 6 Fr Safe-T-Centesis catheter was introduced. Thoracentesis was performed. The catheter was removed and a dressing applied. FINDINGS: A total of approximately 700 mL of hazy yellow fluid was removed. Samples were sent to the laboratory as requested by the clinical  team. IMPRESSION: Successful ultrasound guided left thoracentesis yielding 700 mL of pleural fluid. Read by: Gareth Eagle, PA-C Electronically Signed   By: Michaelle Birks M.D.   On: 12/25/2020 16:44      ASSESSMENT/PLAN   Acute on chronic hypoxemic respiratory failure There is complete atelectasis of left lung secondary to left pleural effusion     - stop heparin transiently and perform diagnostic and therapeutic thoracentesis   - Pleural fluid studies ordered both infections and malignant workup    - Reviewed with patient he is agreeable to plan -metaneb bid with RT  Sepsis with Klebsiella bacteremia  On IV antibiotics    - ID on case appreciate input   Extensive stage small cell lung ca Currently on chemo/immunotherapy  - oncology on case - prognosis poor - appreciate input   Thank you for allowing me to participate in the care of this patient.   Patient/Family are satisfied with care plan and all questions have been answered.  This document was prepared using  Dragon voice recognition software and may include unintentional dictation errors.     Ottie Glazier, M.D.  Division of [facility]

## 2020-12-30 NOTE — Progress Notes (Signed)
Nutrition Follow-up  DOCUMENTATION CODES:   Severe malnutrition in context of chronic illness  INTERVENTION:   Vital Cuisine TID, each supplement provides 520kcal and 22g of protein.    Magic cup TID with meals, each supplement provides 290 kcal and 9 grams of protein   Dysphagia 3 diet    MVI po daily   If G-tube placed, recommend:  Osmolite 1.5@65ml /hr- Initiate at 22m/hr and increase by 163mhr q 8 hours until goal rate is reached.   Free water flushes 5066m4 hours   Regimen provides 2340kcal/day, 98g/day protein and 1489m59my free water   Pt at high refeed risk; recommend monitor potassium, magnesium and phosphorus labs daily until stable  NUTRITION DIAGNOSIS:   Severe Malnutrition related to cancer and cancer related treatments as evidenced by severe fat depletion, severe muscle depletion. -ongoing   GOAL:   Patient will meet greater than or equal to 90% of their needs -not met   MONITOR:   PO intake, Supplement acceptance, Labs, Weight trends, Skin, I & O's  ASSESSMENT:   70 y23r old male with PMH of 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 fib not on AC, chronic hep C, chronic cancer pain, HTN, alcohol abuse, malnutrition and polysubstance who is admitted with neutropenic sepsis due to acute cholecystitis and Klebsiella bacteremia now s/p cholecystostomy on 9/11 by IR. Hospital admission complicated by acute thromboembolic stroke with new right-sided weakness-confirmed on MRI brain on 9/11.  Met with pt in room today. Pt continues to have poor appetite and oral intake; pt eating 0% of most meals. Forty eight hour calorie count completed and pt is meeting <1% of his estimated needs via oral intake. Pt is refusing all supplements. Pt refusing G-tube and hospice. Spoke with MD and palliative care; palliative care to re-address goals of care with family tomorrow.   No new weight since 9/15; will request daily weights for  trending.   Medications reviewed and include: marinol, folic acid, lasix, aldactone, MVI, thiamine, heparin   Labs reviewed: K 3.8 wnl, P 2.9 wnl, Mg 1.8 wnl Wbc- 14.7(H), Hgb 8.1(L), Hct 23.6(L)  Diet Order:   Diet Order             DIET DYS 3 Room service appropriate? Yes with Assist; Fluid consistency: Thin; Fluid restriction: 2000 mL Fluid  Diet effective now                  EDUCATION NEEDS:   Education needs have been addressed  Skin:  Skin Assessment: Skin Integrity Issues: Skin Integrity Issues:: DTI DTI: R heel; L heel  Last BM:  9/20- type 6  Height:   Ht Readings from Last 1 Encounters:  12/18/20 6' (1.829 m)    Weight:   Wt Readings from Last 1 Encounters:  12/24/20 70.8 kg    Ideal Body Weight:  80.9 kg  BMI:  Body mass index is 21.17 kg/m.  Estimated Nutritional Needs:   Kcal:  2000-2300kcal/day  Protein:  100-115g/day  Fluid:  1.6-1.9L/day  CaseKoleen Distance RD, LDN Please refer to [facility] RD and/or RD on-call/weekend/after hours pager

## 2020-12-30 NOTE — Progress Notes (Signed)
ANTICOAGULATION CONSULT NOTE  Pharmacy Consult for heparin infusion Indication: afib and possible thromboembolic stroke  No Known Allergies  Patient Measurements: Height: 6' (182.9 cm) Weight: 70.8 kg (156 lb 1.4 oz) IBW/kg (Calculated) : 77.6  Vital Signs: Temp: 98.2 F (36.8 C) (09/21 0305) Temp Source: Oral (09/21 0305) BP: 117/72 (09/21 0305) Pulse Rate: 96 (09/21 0305)  Labs: Recent Labs    12/27/20 0615 12/27/20 1158 12/27/20 2000 12/28/20 0515 12/28/20 1220 12/29/20 0925 12/29/20 1740 12/30/20 0215  HGB 8.5*  --   --  8.5*  --  8.4*  --  8.1*  HCT 24.1*  --   --  23.8*  --  24.0*  --  23.6*  PLT 310  --   --  394  --  508*  --  539*  APTT 75* 63*  --   --   --   --   --   --   HEPARINUNFRC 0.25*  --    < > 0.16*   < > 0.27* <0.10* 0.46  CREATININE 0.57*  --   --  0.62  --   --   --   --    < > = values in this interval not displayed.     Estimated Creatinine Clearance: 87.3 mL/min (by C-G formula based on SCr of 0.62 mg/dL).   Medical History: Past Medical History:  Diagnosis Date   A-fib ([place]) 01/10/2019   pt st this was dx by Dr. Ubaldo Glassing   Cancer [facility]) 0600   LUNG   Complication of anesthesia    DIFFICULTY WAKING UP AFTER SURGERY- 20 YRS AGO   Hypertension    Lung cancer ([place])    Substance abuse ([place])     Medications:  Eliquis 5 mg BID - last dose 9/15 2017  Assessment: 70 year old M with PMH of stage IV small cell lung cancer on chemo, chemo induced pancytopenia, A. fib, chronic hep C, chronic cancer pain, HTN, alcohol abuse, malnutrition and polysubstance use presenting with nausea and vomiting and admitted with neutropenic sepsis due to acute cholecystitis and Klebsiella bacteremia. Pt with acute stroke confirmed on MRI 9/11. Pharmacy has been consulted for heparin dosing/monitoring.   Goal of Therapy:  Heparin level 0.3-0.5 units/ml - confirmed with MD to utilize stroke goals Monitor platelets by anticoagulation protocol: Yes  9/21 0215 HL  0.46, therapeutic x 1   Plan:  Continue heparin infusion rate at 2150 units/hr Recheck heparin level 6 hours to confirm. CBC daily while on heparin  Renda Rolls, PharmD, [facility] 12/30/2020 3:15 AM

## 2020-12-30 NOTE — Progress Notes (Signed)
Occupational Therapy Treatment Patient Details Name: DANYL DEEMS MRN: [id number] DOB: 15-Feb-1951 Today's Date: 12/30/2020   History of present illness Pt is a 70 y.o. M arriving to ED for c/o nausea, vomiting and admitted for neutropenic sepsis,severe neutropenia, cholecystitis. PMH includes stage IV small cell carcinoma, anemia, thrombocytopenia, hx of substance abuse, chronic hepatitis C, HTN., a-fib. During hospitalization has been complicated by acute CVA with residual right-sided weakness. During hospitalization multifocal acute embolic strokes occured in bilateral cerebral hemisphere & L cerebellum.   OT comments  Pt seen for OT/PT co-treatment on this date. Session coordinated with PT to address functional/ADL transfers and address complexity of pt's impairments. Upon arrival to room, pt awake in bed with lights off. Pt voicing discouragement and initially declining tx, however agreeable following MOD encouragement. Pt currently presents with decreased activity tolerance and decreased strength. Due to these functional impairments, pt requires MIN A for bed mobility, MIN GUARD for UB ADLs seated EOB, MOD A+2 for sit>stand transfers with RW, and MIN A+2 for static standing balance with RW. This date, pt also engaged in bed-level therapy exercises consisting of shoulder external rotation and horizontal abduction (with yellow theraband). Pt continues to benefit from skilled OT services to maximize return to PLOF and minimize risk of future falls, injury, caregiver burden, and readmission. Will continue to follow POC. Discharge recommendation remains appropriate.     Recommendations for follow up therapy are one component of a multi-disciplinary discharge planning process, led by the attending physician.  Recommendations may be updated based on patient status, additional functional criteria and insurance authorization.    Follow Up Recommendations  SNF    Equipment Recommendations  Other (comment)  (defer to next venue of care)       Precautions / Restrictions Precautions Precautions: Fall Precaution Comments: High fall Restrictions Weight Bearing Restrictions: No       Mobility Bed Mobility Overal bed mobility: Needs Assistance Bed Mobility: Supine to Sit;Sit to Supine     Supine to sit: Min assist;HOB elevated Sit to supine: Min assist   General bed mobility comments: MIN A for LE management    Transfers Overall transfer level: Needs assistance Equipment used: Rolling walker (2 wheeled) Transfers: Sit to/from Stand Sit to Stand: Mod assist;+2 safety/equipment         General transfer comment: MOD A+2 for sit>stand transfer, MIN A+2 for stand>site    Balance Overall balance assessment: Needs assistance Sitting-balance support: Single extremity supported;Feet supported Sitting balance-Leahy Scale: Fair Sitting balance - Comments: MIN GUARD for dynamic sitting balance, intermittent foward/lateral lean in setting of fatigue, however no physical assist required   Standing balance support: Bilateral upper extremity supported;During functional activity Standing balance-Leahy Scale: Poor Standing balance comment: MIN A +2 to maintain static standing balance for ~10sec. Pt with initial L lateral lean, however with cues, able to obtain midline following verbal cues.                           ADL either performed or assessed with clinical judgement   ADL Overall ADL's : Needs assistance/impaired     Grooming: Wash/dry face;Set up;Min guard;Sitting               Lower Body Dressing: Maximal assistance;Bed level Lower Body Dressing Details (indicate cue type and reason): to don/doff socks                    Cognition Arousal/Alertness: Awake/alert Behavior During  Therapy: WFL for tasks assessed/performed Overall Cognitive Status: No family/caregiver present to determine baseline cognitive functioning                                  General Comments: easily fatigues, voices discouragement however agreeable following MOD encouragement        Exercises General Exercises - Upper Extremity Shoulder Horizontal ABduction: Strengthening;Both;10 reps;Supine;Theraband Theraband Level (Shoulder Horizontal Abduction): Level 1 (Yellow)      General Comments On RA, SpO2 desat 87% following bed mobility. SpO2 able to increase to 91% within 30 sec with [place]on donned (2L/min). HR MAX during functional mobility 110s.    Pertinent Vitals/ Pain       Pain Assessment: No/denies pain (no pain, just reporting general discomfort)         Frequency  Min 1X/week        Progress Toward Goals  OT Goals(current goals can now be found in the care plan section)  Progress towards OT goals: Progressing toward goals  Acute Rehab OT Goals Patient Stated Goal: to feel better OT Goal Formulation: With patient Time For Goal Achievement: 01/03/21 Potential to Achieve Goals: Good  Plan Discharge plan remains appropriate;Frequency remains appropriate    Co-evaluation    PT/OT/SLP Co-Evaluation/Treatment: Yes Reason for Co-Treatment: To address functional/ADL transfers;Complexity of the patient's impairments (multi-system involvement) PT goals addressed during session: Mobility/safety with mobility OT goals addressed during session: ADL's and self-care      AM-PAC OT "6 Clicks" Daily Activity     Outcome Measure   Help from another person eating meals?: A Little Help from another person taking care of personal grooming?: A Lot Help from another person toileting, which includes using toliet, bedpan, or urinal?: A Lot Help from another person bathing (including washing, rinsing, drying)?: A Lot Help from another person to put on and taking off regular upper body clothing?: A Lot Help from another person to put on and taking off regular lower body clothing?: A Lot 6 Click Score: 13    End of Session Equipment Utilized  During Treatment: Gait belt;Rolling walker;Oxygen  OT Visit Diagnosis: Unsteadiness on feet (R26.81);Muscle weakness (generalized) (M62.81)   Activity Tolerance Patient limited by fatigue   Patient Left in bed;with call bell/phone within reach;with bed alarm set   Nurse Communication Mobility status        Time: 2956-2130 OT Time Calculation (min): 24 min  Charges: OT General Charges $OT Visit: 1 Visit OT Treatments $Therapeutic Activity: 8-22 mins  Fredirick Maudlin, [place]

## 2020-12-30 NOTE — Progress Notes (Signed)
Patient had a percutaneous cholecystostomy catheter placed 9/11 by IR during this admission and planning for discharge soon. The patient should discharge with a prescription for saline flushes and flush drainage catheter with 5 cc of sterile saline daily and record output daily.   Order to our IR clinic was placed for 8 week follow-up with drain injection and exchange and the patient will hear from our clinic staff to schedule drain follow up appointment time.  [facility] [place]  [address]  [place] [address] [phone]  Hedy Jacob, PA-C 12/30/2020, 11:15 AM

## 2020-12-30 NOTE — Progress Notes (Signed)
48 hr Calorie Count Day 2  Estimated Nutritional Needs:    Kcal:  2000-2300kcal/day Protein:  100-115g/day Fluid:  1.6-1.9L/day  Lunch 9/20: pt consumed 0% of this meal  Total- 0  Breakfast 9/21: pt consumed 0% of this meal  Total- 0  Lunch 9/21: pt consumed 0% of this meal  Total- 0  Supplements: pt is not consuming any supplements   Total- 0  Total Intake:  0kcal (0% estimated needs) and  0g protein (0% estimated needs)  Koleen Distance MS, RD, LDN Please refer to [facility] for RD and/or RD on-call/weekend/after hours pager

## 2020-12-31 ENCOUNTER — Inpatient Hospital Stay: Payer: Medicare Other

## 2020-12-31 DIAGNOSIS — A419 Sepsis, unspecified organism: Secondary | ICD-10-CM | POA: Diagnosis not present

## 2020-12-31 DIAGNOSIS — D709 Neutropenia, unspecified: Secondary | ICD-10-CM | POA: Diagnosis not present

## 2020-12-31 DIAGNOSIS — Z515 Encounter for palliative care: Secondary | ICD-10-CM | POA: Diagnosis not present

## 2020-12-31 LAB — CBC
HCT: 22.8 % — ABNORMAL LOW (ref 39.0–52.0)
Hemoglobin: 7.8 g/dL — ABNORMAL LOW (ref 13.0–17.0)
MCH: 32.9 pg (ref 26.0–34.0)
MCHC: 34.2 g/dL (ref 30.0–36.0)
MCV: 96.2 fL (ref 80.0–100.0)
Platelets: 549 10*3/uL — ABNORMAL HIGH (ref 150–400)
RBC: 2.37 MIL/uL — ABNORMAL LOW (ref 4.22–5.81)
RDW: 13.7 % (ref 11.5–15.5)
WBC: 12.4 10*3/uL — ABNORMAL HIGH (ref 4.0–10.5)
nRBC: 0 % (ref 0.0–0.2)

## 2020-12-31 LAB — HEPARIN LEVEL (UNFRACTIONATED): Heparin Unfractionated: 0.38 IU/mL (ref 0.30–0.70)

## 2020-12-31 NOTE — Progress Notes (Signed)
[place] [place] PROGRESS NOTE    SALAH NAKAMURA  RDE:[id number] DOB: [birth year] DOA: 12/17/2020 PCP: Earlie Server, MD      Brief Narrative:  Mr. Bistline is a 70 y.o. M with stage IV small cell lung cancer on chemo, A. fib not on anticoagulation, chronic hep C, HTN, history of alcohol use and polysubstance abuse who presented with nausea and vomiting.  In the ER, he was confused.  WBC 200, platelets 7K, RR 43.  CT abdomen showed distended gallbladder with stones.  Admitted and started on antibiotics.   9/11: IR Perc drain placed in GB 9/11: Developed acute right arm weakness, MRI brain confirms embolic stroke 6/31: noted to have large pleural effusion, thoracentesis done, no malignant cells        Assessment & Plan:  Severe sepsis due to Klebsiella pneumoniae bacteremia due to cholecystitis Patient with tachypneic, leukopenia and confusion at admission.  Source gallbladder.  Patient admitted and underwent percutaneous drain on 9/11.    Completed 9 days Flagyl and 10 days IV Rocephin per ID.  Resolved.  Still has no appetite.  0-10% oral intake during his recent 48 hour calorie count.     Stage IV small cell lung cancer with metastasis to bone Chemotherapy related pancytopenia Thrombocytopenia and leukopenia resolved from admission.  Stable today. -I recommend residential hospice   New bilateral embolic CVA Developed new onset right arm weakness during this hospitalization and MRI brain was performed that showed multifocal acute embolic appearing strokes in the bilateral cerebral hemispheres.  Evaluated by neurology, final recommendations were - Continue aspirin - No TEE given concern for aspiration - No statin given LDL 37 - Outpatient neurology follow-up - PT eval  Paroxysmal atrial fibrillation with RVR - We will continue IV heparin until decision about Pleurx catheter has been made, if no Pleurx catheter is planned, will transition back to home Eliquis at  discharge -Continue diltiazem   Left pleural effusion Underwent thoracentesis on 9/16, the symptoms resolved - Continue Lasix and spironolactone   Acute kidney injury Resolved  Polysubstance use disorder  Hyponatremia Resolved  Hypokalemia Hypomagnesemia Resolved   Urinary retention Transient, foley removed.   Severe protein calorie malnutrition - Continue dronabinol, Megace   Bilateral deep tissue injuries to the posterior head, present on arrival                Disposition: Status is: Inpatient  Remains inpatient appropriate because:Unsafe d/c plan  Dispo: The patient is from: Home              Anticipated d/c is to:  TBD              Patient currently is not medically stable to d/c.   Difficult to place patient No       Level of care: Med-Surg       MDM: The below labs and imaging reports were reviewed and summarized above.  Medication management as above.    DVT prophylaxis: Place and maintain sequential compression device Start: 12/18/20 1303 Place TED hose Start: 12/17/20 1523  Code Status: DNR Family Communication:     Consultants:  Oncology Palliative Care Pulmonology Neurology Infectious disease Cardiology  Procedures:  9/11-PERC drain into gallbladder 9/16 left-sided thoracentesis  Antimicrobials:  Rocephin 9/11-9/18 Flagyl 9/12-9/17  Culture data:  9/13 blood culture x2, no growth to date          Subjective: Patient is listless, tired, has no complaints, no chest pain, abdominal pain, dyspnea, confusion.  No fever.  Objective: Vitals:   12/30/20 1652 12/30/20 1936 12/31/20 0529 12/31/20 0848  BP: 104/69 100/70  116/79  Pulse: (!) 104 92  98  Resp: 18 18  18   Temp: 98.5 F (36.9 C) 98.2 F (36.8 C)  98.3 F (36.8 C)  TempSrc: Oral Oral  Oral  SpO2: 95% 96%  96%  Weight:   68.7 kg   Height:        Intake/Output Summary (Last 24 hours) at 12/31/2020 1201 Last data filed at 12/31/2020  6834 Gross per 24 hour  Intake 365.5 ml  Output 625 ml  Net -259.5 ml   Filed Weights   12/18/20 2130 12/24/20 1428 12/31/20 0529  Weight: 62.2 kg 70.8 kg 68.7 kg    Examination: General appearance: Cachectic adult male, alert and in no acute distress.  Appears listless HEENT: Anicteric, conjunctiva pink, lids and lashes normal. No nasal deformity, discharge, epistaxis.  Lips moist.   Skin: Warm and dry.  Pale, no jaundice.  No suspicious rashes or lesions. Cardiac: RRR, nl S1-S2, no murmurs appreciated.  No JVD, no lower extremity edema Respiratory: Normal respiratory rate and rhythm.  CTAB without rales or wheezes. Abdomen: Abdomen soft.  Mild nonfocal TTP without guarding, rigidity, rebound. No ascites, distension, hepatosplenomegaly.   MSK: No deformities or effusions.  Diffuse loss of subcutaneous muscle mass and fat Neuro: Awake and alert but listless.  EOMI, moves all extremities with severe lassitude. Speech fluent.    Psych: Sensorium intact and responding to questions, attention diminished, affect blunted, judgment insight appear impaired    Data Reviewed: I have personally reviewed following labs and imaging studies:  CBC: Recent Labs  Lab 12/25/20 0554 12/26/20 0324 12/27/20 0615 12/28/20 0515 12/29/20 0925 12/30/20 0215 12/31/20 0534  WBC 17.2*   < > 15.6* 14.5* 14.7* 14.7* 12.4*  NEUTROABS 11.4*  --   --   --   --   --   --   HGB 10.1*   < > 8.5* 8.5* 8.4* 8.1* 7.8*  HCT 29.3*   < > 24.1* 23.8* 24.0* 23.6* 22.8*  MCV 97.7   < > 98.4 97.5 96.4 97.9 96.2  PLT 198   < > 310 394 508* 539* 549*   < > = values in this interval not displayed.   Basic Metabolic Panel: Recent Labs  Lab 12/25/20 0554 12/27/20 0615 12/28/20 0515  NA 141 137 136  K 3.5 3.0* 3.8  CL 112* 108 108  CO2 24 25 22   GLUCOSE 105* 98 93  BUN 14 12 10   CREATININE 0.71 0.57* 0.62  CALCIUM 7.7* 7.3* 7.6*  MG 1.6* 1.7 1.8  PHOS 2.8 2.9 2.9   GFR: Estimated Creatinine Clearance: 84.7  mL/min (by C-G formula based on SCr of 0.62 mg/dL). Liver Function Tests: Recent Labs  Lab 12/25/20 0554 12/27/20 0615 12/28/20 0515  AST 23  --   --   ALT 63*  --   --   ALKPHOS 60  --   --   BILITOT 1.0  --   --   PROT 5.4*  --   --   ALBUMIN 2.0* 1.9* 1.9*   No results for input(s): LIPASE, AMYLASE in the last 168 hours. No results for input(s): AMMONIA in the last 168 hours. Coagulation Profile: No results for input(s): INR, PROTIME in the last 168 hours. Cardiac Enzymes: Recent Labs  Lab 12/25/20 0554  CKTOTAL 62   BNP (last 3 results) No results for input(s): PROBNP in  the last 8760 hours. HbA1C: No results for input(s): HGBA1C in the last 72 hours. CBG: No results for input(s): GLUCAP in the last 168 hours. Lipid Profile: No results for input(s): CHOL, HDL, LDLCALC, TRIG, CHOLHDL, LDLDIRECT in the last 72 hours. Thyroid Function Tests: No results for input(s): TSH, T4TOTAL, FREET4, T3FREE, THYROIDAB in the last 72 hours. Anemia Panel: Recent Labs    12/29/20 0925 12/30/20 0555  VITAMINB12  --  5,354*  RETICCTPCT 3.3*  --    Urine analysis:    Component Value Date/Time   COLORURINE AMBER (A) 12/17/2020 1840   APPEARANCEUR TURBID (A) 12/17/2020 1840   LABSPEC 1.030 12/17/2020 1840   PHURINE 5.0 12/17/2020 1840   GLUCOSEU 100 (A) 12/17/2020 1840   HGBUR LARGE (A) 12/17/2020 1840   BILIRUBINUR MODERATE (A) 12/17/2020 1840   KETONESUR TRACE (A) 12/17/2020 1840   PROTEINUR 100 (A) 12/17/2020 1840   NITRITE POSITIVE (A) 12/17/2020 1840   LEUKOCYTESUR NEGATIVE 12/17/2020 1840   Sepsis Labs: @LABRCNTIP (procalcitonin:4,lacticacidven:4)  ) Recent Results (from the past 240 hour(s))  CULTURE, BLOOD (ROUTINE X 2) w Reflex to ID Panel     Status: None   Collection Time: 12/22/20  2:14 PM   Specimen: BLOOD  Result Value Ref Range Status   Specimen Description BLOOD LEFT ANTECUBITAL  Final   Special Requests   Final    BOTTLES DRAWN AEROBIC AND ANAEROBIC  Blood Culture adequate volume   Culture   Final    NO GROWTH 5 DAYS Performed at [facility], [address] [place], [place] [address]    Report Status 12/27/2020 FINAL  Final  CULTURE, BLOOD (ROUTINE X 2) w Reflex to ID Panel     Status: None   Collection Time: 12/22/20  2:15 PM   Specimen: BLOOD  Result Value Ref Range Status   Specimen Description BLOOD RIGHT ANTECUBITAL  Final   Special Requests   Final    BOTTLES DRAWN AEROBIC AND ANAEROBIC Blood Culture adequate volume   Culture   Final    NO GROWTH 5 DAYS Performed at [facility], [address] [place], [place] [address]    Report Status 12/27/2020 FINAL  Final  Acid Fast Smear (AFB)     Status: None   Collection Time: 12/25/20  4:30 PM   Specimen: PATH Cytology Pleural fluid  Result Value Ref Range Status   AFB Specimen Processing Concentration  Final   Acid Fast Smear Negative  Final    Comment: (NOTE) Performed At: [facility] [place], [place] [id number] Rush Farmer MD PX:[id number]    Source (AFB) PLEURAL  Final    Comment: Performed at [facility], [address] [place], [place] [address]  Body fluid culture w Gram Stain     Status: None   Collection Time: 12/25/20  4:30 PM   Specimen: PATH Cytology Pleural fluid  Result Value Ref Range Status   Specimen Description   Final    PLEURAL Performed at [facility], [address] [place], [place] [address]    Special Requests   Final    NONE Performed at [facility], [address] [place], [place] [address]    Gram Stain   Final    RARE WBC PRESENT,BOTH PMN AND MONONUCLEAR NO ORGANISMS SEEN    Culture   Final    NO GROWTH 3 DAYS Performed at [facility], [place] [address], [place],  [address]    Report  Status 12/29/2020 FINAL  Final  Body fluid culture w Gram Stain     Status: None   Collection Time: 12/25/20  4:49 PM   Specimen: BILE; Body Fluid  Result  Value Ref Range Status   Specimen Description   Final    BILE Performed at [facility], [address] [place], [place] [address]    Special Requests   Final    NONE Performed at [facility], [address] [place], [place] [address]    Gram Stain   Final    RARE WBC PRESENT, PREDOMINANTLY MONONUCLEAR NO ORGANISMS SEEN    Culture   Final    NO GROWTH 3 DAYS Performed at [facility], [place] [address], [place], [place] [address]    Report Status 12/29/2020 FINAL  Final         Radiology Studies: No results found.      Scheduled Meds:  sodium chloride   Intravenous Once   chlorhexidine  10 mL Mouth/Throat BID   Chlorhexidine Gluconate Cloth  6 each Topical Daily   diltiazem  240 mg Oral Daily   dronabinol  5 mg Oral BID AC   folic acid  1 mg Oral Daily   furosemide  20 mg Oral Daily   guaiFENesin  1,200 mg Oral BID   megestrol  400 mg Oral BID   multivitamin with minerals  1 tablet Oral Daily   sodium chloride flush  10-40 mL Intracatheter Q12H   sodium chloride flush  5 mL Intracatheter Q8H   spironolactone  50 mg Oral Daily   thiamine  100 mg Oral Daily   Continuous Infusions:  sodium chloride Stopped (12/25/20 1558)   sodium chloride 10 mL/hr (12/20/20 1220)   heparin 2,150 Units/hr (12/31/20 9450)     LOS: 14 days    Time spent: 25 minutes    Edwin Dada, MD [facility] 12/31/2020, 12:01 PM     Please page though [place] or Epic secure chat:  For [employer], Adult nurse

## 2020-12-31 NOTE — Progress Notes (Addendum)
Biliary dressing changed. Notice redness around site. Bilateral heel dressing changed. Will notify incoming shift. Will continue to monitor.

## 2020-12-31 NOTE — Progress Notes (Signed)
[place] for Heparin Infusion Indication: Atrial fibrillation and recent stroke  No Known Allergies  Patient Measurements: Height: 6' (182.9 cm) Weight: 68.7 kg (151 lb 7.3 oz) IBW/kg (Calculated) : 77.6  Vital Signs: Temp: 98.2 F (36.8 C) (09/21 1936) Temp Source: Oral (09/21 1936) BP: 100/70 (09/21 1936) Pulse Rate: 92 (09/21 1936)  Labs: Recent Labs    12/29/20 0925 12/29/20 1740 12/30/20 0215 12/30/20 0555 12/30/20 1342 12/31/20 0534  HGB 8.4*  --  8.1*  --   --  7.8*  HCT 24.0*  --  23.6*  --   --  22.8*  PLT 508*  --  539*  --   --  549*  HEPARINUNFRC 0.27*   < > 0.46 0.42 0.50 0.38   < > = values in this interval not displayed.     Estimated Creatinine Clearance: 84.7 mL/min (by C-G formula based on SCr of 0.62 mg/dL).   Medications:  Eliquis 5mg  BID - last dose 9/15 2017  Assessment: 70 year old M with PMH of stage IV small cell lung cancer on chemo, chemo induced pancytopenia, A. fib, chronic hep C, chronic cancer pain, HTN, alcohol abuse, malnutrition and polysubstance use presenting with nausea and vomiting and admitted with neutropenic sepsis due to acute cholecystitis and Klebsiella bacteremia. Pt with acute stroke confirmed on MRI 9/11. Pharmacy has been consulted for heparin dosing/monitoring.   Goal of Therapy:  Heparin level 0.3-0.5 units/ml - confirmed with MD to utilize stroke goals Monitor platelets by anticoagulation protocol: Yes  Date Time HL  9/21  0215  0.46, therapeutic x 1 9/21 0555 0.42, early draw, repeat HL 9/21 1342 0.50, therapeutic x 2 9/22 0534 0.38, therapeutic   Per protocol, Heparin level can be checked less frequently (daily) after second therapeutic measurement.   Plan:  Continue Heparin infusion rate at 2150 u/hr Recheck Heparin level in AM CBC daily while on Heparin  Renda Rolls, PharmD, [facility] 12/31/2020 7:08 AM

## 2020-12-31 NOTE — Progress Notes (Signed)
[place]  Telephone:([phone] [phone]   Name: Scott Jordan Date: 12/31/2020 MRN: [id number]  DOB: 1951/03/27  Patient Care Team: Earlie Server, MD as PCP - General (Oncology) Telford Nab, RN as Registered Nurse Erby Pian, MD as Referring Physician (Specialist) Teodoro Spray, MD as Consulting Physician (Cardiology) Anthonette Legato, MD as Consulting Physician (Nephrology) Vladimir Crofts, MD as Consulting Physician (Neurology) Meade Maw, MD as Consulting Physician (Neurosurgery)    REASON FOR CONSULTATION: Scott Jordan is a 70 y.o. male with multiple medical problems including stage IV small cell carcinoma on chemotherapy, protein calorie malnutrition, anemia, history of substance and alcohol abuse, HCV, history of aspiration pneumonia, who was admitted to hospital on 12/17/2020 with sepsis and Klebsiella bacteremia.  Abdominal ultrasound confirmed cholelithiasis with cholecystitis and patient underwent IR guided cholecystostomy on 9/11.  Unfortunately, his hospitalization has been complicated by acute CVA with residual right-sided weakness and persistently poor oral intake.  Palliative care was consulted to address goals..   CODE STATUS: DNR  PAST MEDICAL HISTORY: Past Medical History:  Diagnosis Date   A-fib ([place]) 01/10/2019   pt st this was dx by Dr. Ubaldo Glassing   Cancer [facility]) [id number]   LUNG   Complication of anesthesia    DIFFICULTY WAKING UP AFTER SURGERY- 20 YRS AGO   Hypertension    Lung cancer ([place])    Substance abuse ([place])     PAST SURGICAL HISTORY:  Past Surgical History:  Procedure Laterality Date   ARM DEBRIDEMENT Left    INCISION AND DEBRIDEMENT LOWER ARM -20 YRS AGO   BACK SURGERY     IR PERC CHOLECYSTOSTOMY  12/20/2020   PORTACATH PLACEMENT Left 08/29/2019   Procedure: INSERTION PORT-A-CATH;  Surgeon: Nestor Lewandowsky, MD;  Location: [facility] ORS;  Service: General;  Laterality: Left;     HEMATOLOGY/ONCOLOGY HISTORY:  Oncology History  Small cell lung cancer ([place])  08/23/2019 Initial Diagnosis   Small cell lung cancer ([place])   09/02/2019 -  Chemotherapy    Patient is on Treatment Plan: LUNG SCLC CARBOPLATIN + ETOPOSIDE + ATEZOLIZUMAB INDUCTION Q21D / ATEZOLIZUMAB MAINTENANCE Q21D        ALLERGIES:  has No Known Allergies.  MEDICATIONS:  Current Facility-Administered Medications  Medication Dose Route Frequency Provider Last Rate Last Admin   0.9 %  sodium chloride infusion (Manually program via Guardrails IV Fluids)   Intravenous Once Sharen Hones, MD       0.9 %  sodium chloride infusion   Intravenous PRN Sharen Hones, MD   Stopped at 12/25/20 1558   0.9 %  sodium chloride infusion    Continuous PRN Criselda Peaches, MD 10 mL/hr at 12/20/20 1220 10 mL/hr at 12/20/20 1220   acetaminophen (TYLENOL) tablet 650 mg  650 mg Oral Q6H PRN Cox, Amy N, DO       Or   acetaminophen (TYLENOL) suppository 650 mg  650 mg Rectal Q6H PRN Cox, Amy N, DO       albuterol (PROVENTIL) (2.5 MG/3ML) 0.083% nebulizer solution 2.5 mg  2.5 mg Nebulization Q6H PRN Ottie Glazier, MD       chlorhexidine (PERIDEX) 0.12 % solution 10 mL  10 mL Mouth/Throat BID Earlie Server, MD   10 mL at 12/28/20 2231   Chlorhexidine Gluconate Cloth 2 % PADS 6 each  6 each Topical Daily Cox, Amy N, DO   6 each at 12/30/20 2000   diltiazem (CARDIZEM CD) 24 hr capsule 240  mg  240 mg Oral Daily Cox, Amy N, DO   240 mg at 12/30/20 1803   diltiazem (CARDIZEM) tablet 30 mg  30 mg Oral Q6H PRN Mercy Riding, MD       dronabinol (MARINOL) capsule 5 mg  5 mg Oral BID Noralee Chars, MD   5 mg at 16/10/96 0454   folic acid (FOLVITE) tablet 1 mg  1 mg Oral Daily Sharen Hones, MD   1 mg at 12/28/20 1501   furosemide (LASIX) tablet 20 mg  20 mg Oral Daily Ottie Glazier, MD   20 mg at 12/30/20 1802   guaiFENesin (MUCINEX) 12 hr tablet 1,200 mg  1,200 mg Oral BID Dorothe Pea, RPH   1,200 mg at 12/30/20 2237   heparin  ADULT infusion 100 units/mL (25000 units/260m)  2,150 Units/hr Intravenous Continuous Rauer, SForde Dandy RPH 21.5 mL/hr at 12/31/20 009812,150 Units/hr at 12/31/20 01914  megestrol (MEGACE) 400 MG/10ML suspension 400 mg  400 mg Oral BID AOttie Glazier MD   400 mg at 12/30/20 2238   multivitamin with minerals tablet 1 tablet  1 tablet Oral Daily ZSharen Hones MD   1 tablet at 12/28/20 1502   ondansetron (ZOFRAN) tablet 4 mg  4 mg Oral Q6H PRN Cox, Amy N, DO       Or   ondansetron (ZOFRAN) injection 4 mg  4 mg Intravenous Q6H PRN Cox, Amy N, DO       oxyCODONE-acetaminophen (PERCOCET/ROXICET) 5-325 MG per tablet 1 tablet  1 tablet Oral Q6H PRN Mansy, Jan A, MD   1 tablet at 12/24/20 0944   sodium chloride flush (NS) 0.9 % injection 10-40 mL  10-40 mL Intracatheter Q12H SMax Sane MD   10 mL at 12/30/20 2200   sodium chloride flush (NS) 0.9 % injection 10-40 mL  10-40 mL Intracatheter PRN SMax Sane MD       sodium chloride flush (NS) 0.9 % injection 5 mL  5 mL Intracatheter Q8H MCriselda Peaches MD   5 mL at 12/31/20 07829  spironolactone (ALDACTONE) tablet 50 mg  50 mg Oral Daily AOttie Glazier MD   50 mg at 12/30/20 1803   thiamine tablet 100 mg  100 mg Oral Daily ZSharen Hones MD   100 mg at 12/28/20 1501   Facility-Administered Medications Ordered in Other Encounters  Medication Dose Route Frequency Provider Last Rate Last Admin   sodium chloride flush (NS) 0.9 % injection 10 mL  10 mL Intravenous PRN CLequita Asal MD   10 mL at 11/11/19 0812    VITAL SIGNS: BP 116/79 (BP Location: Right Arm)   Pulse 98   Temp 98.3 F (36.8 C) (Oral)   Resp 18   Ht 6' (1.829 m)   Wt 151 lb 7.3 oz (68.7 kg)   SpO2 96%   BMI 20.54 kg/m  Filed Weights   12/18/20 2130 12/24/20 1428 12/31/20 0529  Weight: 137 lb 2 oz (62.2 kg) 156 lb 1.4 oz (70.8 kg) 151 lb 7.3 oz (68.7 kg)    Estimated body mass index is 20.54 kg/m as calculated from the following:   Height as of this encounter: 6'  (1.829 m).   Weight as of this encounter: 151 lb 7.3 oz (68.7 kg).  LABS: CBC:    Component Value Date/Time   WBC 12.4 (H) 12/31/2020 0534   HGB 7.8 (L) 12/31/2020 0534   HCT 22.8 (L) 12/31/2020 0534   PLT 549 (H) 12/31/2020  0534   MCV 96.2 12/31/2020 0534   NEUTROABS 11.4 (H) 12/25/2020 0554   LYMPHSABS 1.2 12/25/2020 0554   MONOABS 1.9 (H) 12/25/2020 0554   EOSABS 0.0 12/25/2020 0554   BASOSABS 0.0 12/25/2020 0554   Comprehensive Metabolic Panel:    Component Value Date/Time   NA 136 12/28/2020 0515   K 3.8 12/28/2020 0515   CL 108 12/28/2020 0515   CO2 22 12/28/2020 0515   BUN 10 12/28/2020 0515   CREATININE 0.62 12/28/2020 0515   GLUCOSE 93 12/28/2020 0515   CALCIUM 7.6 (L) 12/28/2020 0515   AST 23 12/25/2020 0554   ALT 63 (H) 12/25/2020 0554   ALKPHOS 60 12/25/2020 0554   BILITOT 1.0 12/25/2020 0554   PROT 5.4 (L) 12/25/2020 0554   ALBUMIN 1.9 (L) 12/28/2020 0515    RADIOGRAPHIC STUDIES: CT ABDOMEN PELVIS WO CONTRAST  Result Date: 12/17/2020 CLINICAL DATA:  Nausea and vomiting EXAM: CT ABDOMEN AND PELVIS WITHOUT CONTRAST TECHNIQUE: Multidetector CT imaging of the abdomen and pelvis was performed following the standard protocol without IV contrast. COMPARISON:  CT 06/23/2020, PET CT 11/19/2020 FINDINGS: Lower chest: Lung bases demonstrate emphysema. Subpleural bandlike airspace disease at the left lower lobe redemonstrated. Incompletely visualized subpleural peripheral left upper lobe nodule measuring 11 mm, series 5, image 1. Cardiomegaly with trace pericardial effusion. Coronary vascular calcification. Hepatobiliary: Extensive motion degradation. No focal hepatic abnormality. Gallstones. Gallbladder slightly distended. Possible wall thickening or pericholecystic fluid though limited by motion Pancreas: Unremarkable. No pancreatic ductal dilatation or surrounding inflammatory changes. Spleen: Normal in size without focal abnormality. Adrenals/Urinary Tract: Thickened  appearance of adrenal glands. No hydronephrosis. The bladder is unremarkable Stomach/Bowel: Stomach nonenlarged. No dilated small bowel. Negative appendix. No acute bowel wall thickening Vascular/Lymphatic: Advanced aortic atherosclerosis. No aneurysm. Slightly increased multiple small retroperitoneal lymph nodes. Reproductive: Prostate is unremarkable. Other: Negative for free air or free fluid Musculoskeletal: No acute osseous abnormality. IMPRESSION: 1. Degraded by motion. 2. Gallbladder appears slightly distended and there are gallstones. Possible gallbladder wall thickening or pericholecystic fluid though limited by motion, consider correlation with ultrasound 3. Cardiomegaly with trace pericardial effusion. 4. Similar subpleural bandlike density at left lower lobe. Incompletely visualized peripheral left upper lobe lung nodule characterized on recent PET CT Electronically Signed   By: Donavan Foil M.D.   On: 12/17/2020 16:50   CT ANGIO HEAD NECK W WO CM  Result Date: 12/21/2020 CLINICAL DATA:  Neuro deficit, acute, stroke suspected. Neutropenic sepsis. EXAM: CT ANGIOGRAPHY HEAD AND NECK TECHNIQUE: Multidetector CT imaging of the head and neck was performed using the standard protocol during bolus administration of intravenous contrast. Multiplanar CT image reconstructions and MIPs were obtained to evaluate the vascular anatomy. Carotid stenosis measurements (when applicable) are obtained utilizing NASCET criteria, using the distal internal carotid diameter as the denominator. CONTRAST:  12m OMNIPAQUE IOHEXOL 350 MG/ML SOLN COMPARISON:  MRI of the brain December 20, 2020. FINDINGS: CT HEAD FINDINGS Brain: Confluent hypodensity of the white matter of the cerebral hemispheres, nonspecific, most likely related to chronic small vessel ischemia. Foci of acute infarct are more conspicuous on recent MRI of the brain. No new large territorial infarct. No hemorrhage, hydrocephalus, mass or midline shift.  Vascular: No hyperdense vessel or unexpected calcification. Skull: Normal. Negative for fracture or focal lesion. Sinuses: Increased thickness of the walls of the bilateral maxillary sinuses with bubbly secretion on the left. Findings are suggestive of chronic sinusitis. Right mastoid effusion. Orbits: No acute finding. Review of the MIP images confirms the above  findings CTA NECK FINDINGS Aortic arch: Standard branching. Imaged portion shows no evidence of aneurysm or dissection. Mild atherosclerotic changes of the aortic arch without significant stenosis of the major arch vessel origins. Right carotid system: Calcified atherosclerotic disease of the right carotid bifurcation without hemodynamically significant stenosis. Left carotid system: Atherosclerotic disease of the left carotid bifurcation with mixed density plaques without hemodynamically significant stenosis. Vertebral arteries: Calcified atherosclerotic plaque at the origin of the right vertebral artery without hemodynamically significant stenosis. Mild atherosclerotic changes at the origin of the left vertebral artery without hemodynamically significant stenosis. Cervical vertebral arteries otherwise have normal caliber Skeleton: Degenerative changes of the cervical spine with fusion of the facet joints at C2-3 on the left. No acute findings. Other neck: Negative. Upper chest: Left-sided pleural effusion and atelectasis. Mediastinal lymphadenopathy. Review of the MIP images confirms the above findings CTA HEAD FINDINGS Anterior circulation: Calcified atherosclerotic plaques in the bilateral carotid siphons. No significant stenosis, proximal occlusion, aneurysm, or vascular malformation. Posterior circulation: No significant stenosis, proximal occlusion, aneurysm, or vascular malformation. Hypoplastic/aplastic right P1/PCA segment with right fetal PCA. Venous sinuses: As permitted by contrast timing, patent. Anatomic variants: Right fetal PCA. Review of  the MIP images confirms the above findings IMPRESSION: 1. Advanced chronic microvascular ischemic changes of the white matter. 2. Atherosclerotic disease of the bilateral carotid bifurcation without hemodynamically significant stenosis. 3. Mild atherosclerotic changes at the origin of the bilateral vertebral arteries without hemodynamically significant stenosis. 4. Mild intracranial atherosclerotic disease without hemodynamically significant stenosis. Electronically Signed   By: Pedro Earls M.D.   On: 12/21/2020 15:32   DG Chest 1 View  Result Date: 12/25/2020 CLINICAL DATA:  Thoracentesis.  Lung cancer EXAM: CHEST  1 VIEW COMPARISON:  Radiograph 12/25/2020 FINDINGS: Interval reduction in LEFT pleural fluid following thoracentesis. No pneumothorax appreciated. A small basilar effusion remains. RIGHT lung clear. Port in the anterior chest wall with tip in distal SVC. IMPRESSION: 1. No pneumothorax appreciated following LEFT thoracentesis. 2. Considerable reduction in LEFT pleural fluid. Electronically Signed   By: Suzy Bouchard M.D.   On: 12/25/2020 17:28   DG Chest 2 View  Result Date: 12/27/2020 CLINICAL DATA:  Status post thoracentesis 2 days prior. EXAM: CHEST - 2 VIEW COMPARISON:  Radiograph same day (12/27/2020 at 830 hours FINDINGS: Persistent near complete opacification of the LEFT hemithorax. Small amount of aerated lung remains at LEFT lung apex. Port in place. RIGHT lung clear. IMPRESSION: No interval change in near complete opacification LEFT hemithorax. Electronically Signed   By: Suzy Bouchard M.D.   On: 12/27/2020 18:17   CT HEAD WO CONTRAST (5MM)  Result Date: 12/17/2020 CLINICAL DATA:  Nausea vomiting EXAM: CT HEAD WITHOUT CONTRAST TECHNIQUE: Contiguous axial images were obtained from the base of the skull through the vertex without intravenous contrast. COMPARISON:  CT brain 06/24/2020, MRI 03/10/2020, head CT 11/22/2019 FINDINGS: Brain: No acute territorial  infarction, hemorrhage or intracranial mass. Extensive white matter disease, progressed from more remote exams from 2021. Stable ventricle size. Chronic lacunar infarcts within the left white matter. Vascular: No hyperdense vessel.  Carotid vascular calcification Skull: Normal. Negative for fracture or focal lesion. Sinuses/Orbits: Mucosal thickening the sinuses. Chronic appearing nasal deformity Other: None IMPRESSION: 1. No definite CT evidence for acute intracranial abnormality. 2. Atrophy. Extensive white matter disease, grossly stable as compared with recent head CT from March, appears progressive when compared to exams from 2021 and prior Electronically Signed   By: Donavan Foil M.D.   On:  12/17/2020 16:39   MR BRAIN W WO CONTRAST  Addendum Date: 12/21/2020   ADDENDUM REPORT: 12/21/2020 14:49 ADDENDUM: Patient returns for repeat postcontrast imaging. There is no abnormal enhancement. No evidence of intracranial metastatic disease. Electronically Signed   By: Macy Mis M.D.   On: 12/21/2020 14:49   Result Date: 12/21/2020 CLINICAL DATA:  Neuro deficit, acute, stroke suspected Small cell lung cancer, monitor EXAM: MRI HEAD WITHOUT AND WITH CONTRAST TECHNIQUE: Multiplanar, multiecho pulse sequences of the brain and surrounding structures were obtained without and with intravenous contrast. CONTRAST:  7.58m GADAVIST GADOBUTROL 1 MMOL/ML IV SOLN COMPARISON:  CT head 12/17/2020. FINDINGS: Mildly motion limited exam.  Within this limitation: Brain: Multiple small areas of restricted diffusion in the bilateral frontal and occipital, and left parietal matter, left perirolandic cortex, left thalamocapsular region, and left cerebellum. Advanced confluent T2 hyperintensity within the white matter, nonspecific but compatible with chronic microvascular ischemic disease and progressed since 2021. Small remote infarct in the left corona radiata. No hydrocephalus, obvious mass lesion, midline shift, acute  hemorrhage, or extra-axial fluid collection. Punctate foci of susceptibility artifact in the left cerebellum and left frontal white matter, nonspecific but potentially related to prior microhemorrhage. Contrast was administered; however, there is no evidence of enhancement (for example, the nasal mucosa/turbinates are nonenhancing). This precludes evaluation for enhancing lesions. Vascular: Major arterial flow voids are maintained at the skull base. Skull and upper cervical spine: Normal marrow signal. Sinuses/Orbits: Mild paranasal sinus disease. Other: Moderate right and small left mastoid effusions. IMPRESSION: 1. Multiple small areas of restricted diffusion in the bilateral frontal and occipital and left parietal white matter, left perirolandic cortex, left thalamocapsular region, and left cerebellum, which are concerning for acute infarcts. Given involvement of multiple vascular territories, consider a central embolic etiology. 2. Contrast was administered; however, there is no evidence of enhancement, unclear why given reported uneventful injection per the technologist. This precludes evaluation for enhancing lesions. Given the patient's known small cell lung cancer diagnosis, recommend repeat postcontrast imaging to exclude metastatic disease. I've asked the MRI technologist to not bill the patient for the repeat post-contrast imaging. 3. Advanced chronic microvascular ischemic disease, progressed since 2021. 4. Moderate right and small left mastoid effusions. Electronically Signed: By: FMargaretha SheffieldM.D. On: 12/20/2020 14:29   IR Perc Cholecystostomy  Result Date: 12/20/2020 INDICATION: 70year old male with small cell lung cancer and chemotherapy induced pancytopenia presenting with neutropenic sepsis. Blood cultures grew out Klebsiella and imaging findings are concerning for acute cholecystitis. He is too sick and frail to be considered an operative candidate. Following transfusion of platelets,  his platelets are now at an acceptable level to proceed with percutaneous cholecystostomy tube placement. EXAM: CHOLECYSTOSTOMY MEDICATIONS: In patient currently receiving intravenous antibiotic therapy. No additional antibiotic prophylaxis administered. ANESTHESIA/SEDATION: Moderate (conscious) sedation was employed during this procedure. A total of Versed 1 mg and Fentanyl 50 mcg and 0.5 mg Dilaudid administered intravenously. Moderate Sedation Time: 13 minutes. The patient's level of consciousness and vital signs were monitored continuously by radiology nursing throughout the procedure under my direct supervision. FLUOROSCOPY TIME:  Fluoroscopy Time: 0 minutes 30 seconds (4.3 mGy). COMPLICATIONS: None immediate. PROCEDURE: Informed written consent was obtained from the patient after a thorough discussion of the procedural risks, benefits and alternatives. All questions were addressed. Maximal Sterile Barrier Technique was utilized including caps, mask, sterile gowns, sterile gloves, sterile drape, hand hygiene and skin antiseptic. A timeout was performed prior to the initiation of the procedure. The right upper quadrant  was interrogated with ultrasound. The distended gallbladder with a thickened wall was successfully identified. There is significant excursion of the liver with respiration due to use of abdominal muscles. A suitable skin entry site was selected. Local anesthesia was attained by infiltration with 1% lidocaine. A small dermatotomy was made. Under real-time ultrasound guidance, the movement of the liver was timed with respirations and in a single pass, a 21 gauge Accustick needle was advanced through the liver and into the gallbladder lumen along a short transhepatic course. A 0.018 wire was quickly advanced into the gallbladder lumen and the needle was exchanged for the transitional dilator. The transitional dilator was advanced into the gallbladder lumen. The wire was removed. Contrast injection  was performed under fluoroscopy confirming opacification of the gallbladder lumen. Multiple filling defects consistent with cholelithiasis. The cystic duct is occluded. A 0.035 wire was then coiled within the gallbladder lumen. The transitional dilator was removed. The transhepatic tract was dilated to 10 [place] with a stiff dilator. A Cook 10.2 [place] all-purpose drainage catheter was then advanced over the wire and formed in the gallbladder lumen. Images were obtained and stored for the medical record. The catheter was gently flushed and connected to gravity bag drainage before being secured to the skin with 0 silk suture. IMPRESSION: Successful placement of 10 French transhepatic percutaneous cholecystostomy tube for an indication of calculus cholecystitis. PLAN: Maintain tube to gravity bag drainage. Patient will likely need home health to assist with care and cleaning of the tube site. Return to interventional radiology approximately every 8 weeks for cholecystostomy tube check and exchange. If patient's clinical status improves in the future, elective cholecystectomy would be optimal. If patient remains a poor operative candidate, long-term cholecystostomy tube maintenance will likely be required. Electronically Signed   By: Jacqulynn Cadet M.D.   On: 12/20/2020 13:50   DG Chest Port 1 View  Result Date: 12/27/2020 CLINICAL DATA:  Shortness of breath with atelectasis common thoracentesis 2 days ago EXAM: PORTABLE CHEST 1 VIEW COMPARISON:  Chest radiograph 12/25/2020 FINDINGS: The left chest wall port is in stable position with the tip terminating in the region of the right atrium. The cardiomediastinal silhouette can not be assessed. The left pleural effusion has increased in size with worsening aeration of the left upper lobe. Aeration of the right lung is not significantly changed. There is no significant right effusion. There is no appreciable pneumothorax. There is no acute osseous abnormality.  Enteric contrast is noted in the colon. IMPRESSION: Increased size of the large left pleural effusion with worsened aeration of the left lung. Electronically Signed   By: Valetta Mole M.D.   On: 12/27/2020 09:12   DG Chest Port 1 View  Result Date: 12/25/2020 CLINICAL DATA:  Shortness of breath EXAM: PORTABLE CHEST 1 VIEW COMPARISON:  Chest x-ray 12/17/2020, PET CT 11/19/2020 FINDINGS: Left-sided central venous port tip over right atrium. Interval complete opacification of left thorax, suspect that there is at least moderate pleural effusion. Cardiomediastinal silhouette is obscured. No pneumothorax is visualized. IMPRESSION: Interval opacification of the entire left thorax which may be due to combination of pleural effusion/atelectasis/airspace disease. Electronically Signed   By: Donavan Foil M.D.   On: 12/25/2020 15:19   DG Chest Port 1 View  Result Date: 12/17/2020 CLINICAL DATA:  Questionable sepsis.  Weakness EXAM: PORTABLE CHEST 1 VIEW COMPARISON:  03/09/2020 FINDINGS: Left subclavian approached chest port remains in place. Stable cardiomegaly. Chronic left basilar scarring or atelectasis. Peripheral left upper lobe nodule better  seen on prior CT. No new airspace consolidation. Pleural effusion or pneumothorax. IMPRESSION: Chronic left basilar scarring or atelectasis. No new or acute findings. Electronically Signed   By: Davina Poke D.O.   On: 12/17/2020 13:20   DG Swallowing Func-Speech Pathology  Result Date: 12/25/2020 Table formatting from the original result was not included. Objective Swallowing Evaluation: Type of Study: MBS-Modified Barium Swallow Study  Patient Details Name: AUNDREY ELAHI MRN: [id number] Date of Birth: Dec 14, 1950 Today's Date: 12/25/2020 Time: SLP Start Time (ACUTE ONLY): 32 -SLP Stop Time (ACUTE ONLY): 1300 SLP Time Calculation (min) (ACUTE ONLY): 30 min Past Medical History: Past Medical History: Diagnosis Date  A-fib ([place]) 01/10/2019  pt st this was dx by Dr. Ubaldo Glassing   Cancer [facility]) [id number]  LUNG  Complication of anesthesia   DIFFICULTY WAKING UP AFTER SURGERY- 45 YRS AGO  Hypertension   Lung cancer ([place])   Substance abuse ([place])  Past Surgical History: Past Surgical History: Procedure Laterality Date  ARM DEBRIDEMENT Left   INCISION AND DEBRIDEMENT LOWER ARM -20 YRS AGO  BACK SURGERY    IR PERC CHOLECYSTOSTOMY  12/20/2020  PORTACATH PLACEMENT Left 08/29/2019  Procedure: INSERTION PORT-A-CATH;  Surgeon: Nestor Lewandowsky, MD;  Location: [facility] ORS;  Service: General;  Laterality: Left; HPI: Pt is a 70 year old male with PMH of 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 fib not on AC, chronic hep C, chronic cancer pain, HTN, alcohol abuse, malnutrition and polysubstance who is admitted with neutropenic sepsis due to acute cholecystitis and Klebsiella bacteremia now s/p cholecystostomy on 9/11 by IR. Hospital admission complicated by acute thromboembolic stroke with new right-sided weakness-confirmed on MRI brain on 12/20/2020.  MRI: Multiple small areas of restricted diffusion in the bilateral  frontal and occipital and left parietal white matter, left  perirolandic cortex, left thalamocapsular region, and left  cerebellum, which are concerning for acute infarcts.  CXR at admit: Chronic left basilar scarring or atelectasis. No new or acute  findings.  Pt is followed by Palliative Care at [facility].  Subjective: pt able to communicate his wants/needs Assessment / Plan / Recommendation CHL IP CLINICAL IMPRESSIONS 12/25/2020 Clinical Impression Pt presents with adequate airway protection while consuming thin liquids, nectar thick liquids, puree and whole barium tablet with thin liquids. Pt's oral phase is unremarkable and his swallow response is timely with good airway protection. His overall medical comorbidities and respiratory decline place him at an overall increased risk of aspiration. For example, when consuming consecutive sips of thin liquids, pt  became short of breath. At this time, recommend pt continue consuming dysphagia 3 diet for energy conservation with thin liquids, medicine whole with thin liquids. Pt needs to follow strict aspiration precautions such as taking single sips and rest breaks. SLP Visit Diagnosis Dysphagia, oropharyngeal phase (R13.12) Attention and concentration deficit following -- Frontal lobe and executive function deficit following -- Impact on safety and function Mild aspiration risk;Moderate aspiration risk   CHL IP TREATMENT RECOMMENDATION 12/25/2020 Treatment Recommendations No treatment recommended at this time   Prognosis 12/24/2020 Prognosis for Safe Diet Advancement Fair Barriers to Reach Goals Motivation;Time post onset;Severity of deficits;Behavior Barriers/Prognosis Comment -- CHL IP DIET RECOMMENDATION 12/25/2020 SLP Diet Recommendations Dysphagia 3 (Mech soft) solids;Thin liquid Liquid Administration via Cup Medication Administration Whole meds with liquid Compensations Minimize environmental distractions;Slow rate;Small sips/bites Postural Changes Remain semi-upright after after feeds/meals (Comment);Seated upright at 90 degrees   CHL IP OTHER RECOMMENDATIONS 12/25/2020 Recommended Consults -- Oral Care Recommendations  Oral care BID Other Recommendations --   CHL IP FOLLOW UP RECOMMENDATIONS 12/25/2020 Follow up Recommendations None   CHL IP FREQUENCY AND DURATION 12/24/2020 Speech Therapy Frequency (ACUTE ONLY) min 3x week Treatment Duration 2 weeks      CHL IP ORAL PHASE 12/25/2020 Oral Phase WFL Oral - Pudding Teaspoon -- Oral - Pudding Cup -- Oral - Honey Teaspoon -- Oral - Honey Cup -- Oral - Nectar Teaspoon -- Oral - Nectar Cup -- Oral - Nectar Straw -- Oral - Thin Teaspoon -- Oral - Thin Cup -- Oral - Thin Straw -- Oral - Puree -- Oral - Mech Soft -- Oral - Regular -- Oral - Multi-Consistency -- Oral - Pill -- Oral Phase - Comment --  CHL IP PHARYNGEAL PHASE 12/25/2020 Pharyngeal Phase WFL Pharyngeal- Pudding Teaspoon  -- Pharyngeal -- Pharyngeal- Pudding Cup -- Pharyngeal -- Pharyngeal- Honey Teaspoon -- Pharyngeal -- Pharyngeal- Honey Cup -- Pharyngeal -- Pharyngeal- Nectar Teaspoon -- Pharyngeal -- Pharyngeal- Nectar Cup -- Pharyngeal -- Pharyngeal- Nectar Straw -- Pharyngeal -- Pharyngeal- Thin Teaspoon -- Pharyngeal -- Pharyngeal- Thin Cup -- Pharyngeal -- Pharyngeal- Thin Straw -- Pharyngeal -- Pharyngeal- Puree -- Pharyngeal -- Pharyngeal- Mechanical Soft -- Pharyngeal -- Pharyngeal- Regular -- Pharyngeal -- Pharyngeal- Multi-consistency -- Pharyngeal -- Pharyngeal- Pill -- Pharyngeal -- Pharyngeal Comment --  CHL IP CERVICAL ESOPHAGEAL PHASE 12/25/2020 Cervical Esophageal Phase WFL Pudding Teaspoon -- Pudding Cup -- Honey Teaspoon -- Honey Cup -- Nectar Teaspoon -- Nectar Cup -- Nectar Straw -- Thin Teaspoon -- Thin Cup -- Thin Straw -- Puree -- Mechanical Soft -- Regular -- Multi-consistency -- Pill -- Cervical Esophageal Comment -- Happi B. [name] M.S., CCC-SLP, [place] Office [phone] Stormy Fabian 12/25/2020, 2:17 PM              ECHOCARDIOGRAM COMPLETE  Result Date: 12/21/2020    ECHOCARDIOGRAM REPORT   Patient Name:   MOTTY BORIN Date of Exam: 12/21/2020 Medical Rec #:  [id number]    Height:       72.0 in Accession #:    [id number]   Weight:       137.1 lb Date of Birth:  Nov 09, 1950     BSA:          1.815 m Patient Age:    31 years     BP:           114/80 mmHg Patient Gender: M            HR:           89 bpm. Exam Location:  [facility] Procedure: 2D Echo, Color Doppler and Cardiac Doppler Indications:     Endocarditis I38  History:         Patient has prior history of Echocardiogram examinations, most                  recent 08/19/2019. Arrythmias:Atrial Fibrillation; Risk                  Factors:Hypertension. Substance abuse.  Sonographer:     Sherrie Sport Referring Phys:  [place] Diagnosing Phys: Serafina Royals MD IMPRESSIONS  1. Left ventricular ejection  fraction, by estimation, is 60 to 65%. The left ventricle has normal function. The left ventricle has no regional wall motion abnormalities. Left ventricular diastolic parameters were normal.  2. Right ventricular systolic function is normal. The right ventricular size is normal.  3. The mitral valve is normal in structure. Trivial mitral  valve regurgitation.  4. The aortic valve is normal in structure. Aortic valve regurgitation is not visualized. Mild aortic valve sclerosis is present, with no evidence of aortic valve stenosis. Conclusion(s)/Recommendation(s): No evidence of valve vegetations. FINDINGS  Left Ventricle: Left ventricular ejection fraction, by estimation, is 60 to 65%. The left ventricle has normal function. The left ventricle has no regional wall motion abnormalities. The left ventricular internal cavity size was small. There is no left ventricular hypertrophy. Left ventricular diastolic parameters were normal. Right Ventricle: The right ventricular size is normal. No increase in right ventricular wall thickness. Right ventricular systolic function is normal. Left Atrium: Left atrial size was normal in size. Right Atrium: Right atrial size was normal in size. Pericardium: There is no evidence of pericardial effusion. Mitral Valve: The mitral valve is normal in structure. Trivial mitral valve regurgitation. Tricuspid Valve: The tricuspid valve is normal in structure. Tricuspid valve regurgitation is trivial. Aortic Valve: The aortic valve is normal in structure. Aortic valve regurgitation is not visualized. Mild aortic valve sclerosis is present, with no evidence of aortic valve stenosis. Aortic valve mean gradient measures 3.0 mmHg. Aortic valve peak gradient measures 5.9 mmHg. Aortic valve area, by VTI measures 2.30 cm. Pulmonic Valve: The pulmonic valve was normal in structure. Pulmonic valve regurgitation is not visualized. Aorta: The aortic root and ascending aorta are structurally normal, with  no evidence of dilitation. IAS/Shunts: No atrial level shunt detected by color flow Doppler.  LEFT VENTRICLE PLAX 2D LVIDd:         3.87 cm LVIDs:         2.30 cm LV PW:         1.00 cm LV IVS:        1.18 cm LVOT diam:     2.10 cm LV SV:         44 LV SV Index:   24 LVOT Area:     3.46 cm  RIGHT VENTRICLE RV Basal diam:  3.70 cm RV S prime:     12.40 cm/s TAPSE (M-mode): 3.7 cm LEFT ATRIUM           Index       RIGHT ATRIUM           Index LA diam:      3.50 cm 1.93 cm/m  RA Area:     33.70 cm LA Vol (A2C): 57.4 ml 31.63 ml/m RA Volume:   128.00 ml 70.53 ml/m LA Vol (A4C): 72.1 ml 39.73 ml/m  AORTIC VALVE                   PULMONIC VALVE AV Area (Vmax):    2.34 cm    PV Vmax:        0.60 m/s AV Area (Vmean):   2.32 cm    PV Peak grad:   1.4 mmHg AV Area (VTI):     2.30 cm    RVOT Peak grad: 2 mmHg AV Vmax:           121.00 cm/s AV Vmean:          86.000 cm/s AV VTI:            0.193 m AV Peak Grad:      5.9 mmHg AV Mean Grad:      3.0 mmHg LVOT Vmax:         81.60 cm/s LVOT Vmean:        57.600 cm/s LVOT VTI:  0.128 m LVOT/AV VTI ratio: 0.66  AORTA Ao Root diam: 3.50 cm MITRAL VALVE               TRICUSPID VALVE MV Area (PHT): 4.52 cm    TR Peak grad:   24.4 mmHg MV Decel Time: 168 msec    TR Vmax:        247.00 cm/s MV E velocity: 88.30 cm/s                            SHUNTS                            Systemic VTI:  0.13 m                            Systemic Diam: 2.10 cm Serafina Royals MD Electronically signed by Serafina Royals MD Signature Date/Time: 12/21/2020/5:20:09 PM    Final    US Abdomen Limited RUQ (LIVER/GB)  Result Date: 12/18/2020 CLINICAL DATA:  Evaluate for cholecystitis. EXAM: ULTRASOUND ABDOMEN LIMITED RIGHT UPPER QUADRANT COMPARISON:  CT AP from 12/17/2020 FINDINGS: Gallbladder: The gallbladder wall is diffusely edematous measuring up to 13.1 mm. Gallstones are noted measuring up to 5.9 mm. Pericholecystic fluid. Positive sonographic Murphy's sign. Common bile duct: Diameter:  6 mm Liver: Diffusely increased parenchymal echogenicity identified. No focal liver abnormality. Portal vein is patent on color Doppler imaging with normal direction of blood flow towards the liver. Other: 1.9 cm right kidney cyst. IMPRESSION: Gallstones, gallbladder wall thickening and pericholecystic fluid with positive sonographic Murphy's sign. Findings are compatible with acute cholecystitis. Electronically Signed   By: Kerby Moors M.D.   On: 12/18/2020 14:18   US THORACENTESIS ASP PLEURAL SPACE W/IMG GUIDE  Result Date: 12/25/2020 INDICATION: Large left pleural effusion secondary to known lung cancer. Request for diagnostic and therapeutic thoracentesis. EXAM: ULTRASOUND GUIDED LEFT THORACENTESIS MEDICATIONS: 1% lidocaine 10 mL COMPLICATIONS: None immediate. PROCEDURE: An ultrasound guided thoracentesis was thoroughly discussed with the patient and questions answered. The benefits, risks, alternatives and complications were also discussed. The patient understands and wishes to proceed with the procedure. Written consent was obtained. Ultrasound was performed to localize and mark an adequate pocket of fluid in the left chest. The area was then prepped and draped in the normal sterile fashion. 1% Lidocaine was used for local anesthesia. Under ultrasound guidance a 6 Fr Safe-T-Centesis catheter was introduced. Thoracentesis was performed. The catheter was removed and a dressing applied. FINDINGS: A total of approximately 700 mL of hazy yellow fluid was removed. Samples were sent to the laboratory as requested by the clinical team. IMPRESSION: Successful ultrasound guided left thoracentesis yielding 700 mL of pleural fluid. Read by: Gareth Eagle, PA-C Electronically Signed   By: Michaelle Birks M.D.   On: 12/25/2020 16:44    PERFORMANCE STATUS (ECOG) : 3 - Symptomatic, >50% confined to bed  Review of Systems Unless otherwise noted, a complete review of systems is negative.  Physical Exam General:  NAD Pulmonary: Unlabored Extremities: no edema, no joint deformities Skin: no rashes Neurological: Weakness but otherwise nonfocal  IMPRESSION: I met with patient, brother, and niece.  Unfortunately, patient has not significantly improved over the past 2 weeks since he was admitted to the hospital.  His oral intake has been extremely poor.  Patient had practically 0% on his calorie count over 48 hours.  He  has been tried on multiple appetite stimulants without improvement.  Patient refused PEG and tells me today that he is not interested in artificial nutrition.  I think this is a reasonable decision and I would not recommend artificial nutrition in the setting of end-stage cancer.  Patient verbalized understanding that without oral intake, he will decline and eventually die.  However, he says he thinks he can improve his appetite on his own.  I have talked candidly about the expectation that he is likely nearing end-of-life. I do not think that he is a rehab candidate and discharging him to rehab would likely result in immediate rehospitalization.  Returning home is also not an option as patient does not have the support required to provide the needed 24/7 care.  I recommended the patient consider inpatient hospice to provide him with comfort and dignity in his final days/weeks.  Family voiced support of hospice but want to let patient make his own decisions.  Patient remains noncommittal.  I encouraged family to speak with patient in an attempt to clarify goals.  PLAN: -Recommend best supportive care/inpatient hospice. Patient considering options   Case and plan discussed with Dr. Loleta Books  Time Total: 35 minutes  Visit consisted of counseling and education dealing with the complex and emotionally intense issues of symptom management and palliative care in the setting of serious and potentially life-threatening illness.Greater than 50%  of this time was spent counseling and coordinating care  related to the above assessment and plan.  Signed by: Altha Harm, PhD, NP-C

## 2020-12-31 NOTE — Progress Notes (Signed)
   12/29/20 1919  Assess: MEWS Score  Temp 97.7 F (36.5 C)  BP 120/84  ECG Heart Rate (!) 118  Resp (!) 25  Assess: MEWS Score  MEWS Temp 0  MEWS Systolic 0  MEWS Pulse 2  MEWS RR 1  MEWS LOC 0  MEWS Score 3  MEWS Score Color Yellow  Assess: if the MEWS score is Yellow or Red  Were vital signs taken at a resting state? Yes  Focused Assessment No change from prior assessment  Does the patient meet 2 or more of the SIRS criteria? No  MEWS guidelines implemented *See Row Information* Yes  Treat  MEWS Interventions Other (Comment) (see note)  Take Vital Signs  Increase Vital Sign Frequency  Yellow: Q 2hr X 2 then Q 4hr X 2, if remains yellow, continue Q 4hrs  Escalate  MEWS: Escalate Yellow: discuss with charge nurse/RN and consider discussing with provider and RRT  Notify: Charge Nurse/RN  Name of Charge Nurse/RN Notified Jonelle Sidle  Date Charge Nurse/RN Notified 12/29/20  Time Charge Nurse/RN Notified 1920  Document  Patient Outcome Other (Comment) (RN to monitor if change in level of care needed.)  Assess: SIRS CRITERIA  SIRS Temperature  0  SIRS Pulse 1  SIRS Respirations  1  SIRS WBC 1  SIRS Score Sum  3

## 2020-12-31 NOTE — Plan of Care (Signed)
  Problem: Education: Goal: Knowledge of General Education information will improve Description: Including pain rating scale, medication(s)/side effects and non-pharmacologic comfort measures Outcome: Progressing   Problem: Clinical Measurements: Goal: Respiratory complications will improve Outcome: Progressing   Problem: Clinical Measurements: Goal: Cardiovascular complication will be avoided Outcome: Progressing   Problem: Safety: Goal: Ability to remain free from injury will improve Outcome: Progressing

## 2020-12-31 NOTE — Progress Notes (Signed)
OT Cancellation Note  Patient Details Name: Scott Jordan MRN: [id number] DOB: Apr 20, 1950   Cancelled Treatment:    Reason Eval/Treat Not Completed: Patient declined, no reason specified. Upon attempt, pt sleeping, wakes briefly to OT's voice. Declines therapy, requesting to continue sleeping. Will re-attempt as schedule permits.   Ardeth Perfect., MPH, MS, OTR/L ascom [phone] 12/31/20, 10:51 AM

## 2021-01-01 DIAGNOSIS — D6481 Anemia due to antineoplastic chemotherapy: Secondary | ICD-10-CM | POA: Diagnosis not present

## 2021-01-01 DIAGNOSIS — I6310 Cerebral infarction due to embolism of unspecified precerebral artery: Secondary | ICD-10-CM | POA: Diagnosis not present

## 2021-01-01 DIAGNOSIS — C3490 Malignant neoplasm of unspecified part of unspecified bronchus or lung: Secondary | ICD-10-CM | POA: Diagnosis not present

## 2021-01-01 DIAGNOSIS — Z9889 Other specified postprocedural states: Secondary | ICD-10-CM | POA: Diagnosis not present

## 2021-01-01 LAB — CBC
HCT: 23 % — ABNORMAL LOW (ref 39.0–52.0)
Hemoglobin: 8 g/dL — ABNORMAL LOW (ref 13.0–17.0)
MCH: 33.5 pg (ref 26.0–34.0)
MCHC: 34.8 g/dL (ref 30.0–36.0)
MCV: 96.2 fL (ref 80.0–100.0)
Platelets: 536 10*3/uL — ABNORMAL HIGH (ref 150–400)
RBC: 2.39 MIL/uL — ABNORMAL LOW (ref 4.22–5.81)
RDW: 13.7 % (ref 11.5–15.5)
WBC: 11.7 10*3/uL — ABNORMAL HIGH (ref 4.0–10.5)
nRBC: 0 % (ref 0.0–0.2)

## 2021-01-01 LAB — HEPARIN LEVEL (UNFRACTIONATED): Heparin Unfractionated: 0.38 IU/mL (ref 0.30–0.70)

## 2021-01-01 MED ORDER — MIRTAZAPINE 15 MG PO TABS
7.5000 mg | ORAL_TABLET | Freq: Every day | ORAL | Status: DC
  Administered 2021-01-01 – 2021-01-03 (×3): 7.5 mg via ORAL
  Filled 2021-01-01 (×3): qty 1

## 2021-01-01 MED ORDER — APIXABAN 5 MG PO TABS
5.0000 mg | ORAL_TABLET | Freq: Two times a day (BID) | ORAL | Status: DC
  Administered 2021-01-01 – 2021-01-04 (×7): 5 mg via ORAL
  Filled 2021-01-01 (×7): qty 1

## 2021-01-01 MED ORDER — FLUOXETINE HCL 10 MG PO CAPS
10.0000 mg | ORAL_CAPSULE | Freq: Every day | ORAL | Status: DC
  Administered 2021-01-01 – 2021-01-04 (×4): 10 mg via ORAL
  Filled 2021-01-01 (×5): qty 1

## 2021-01-01 NOTE — Progress Notes (Addendum)
[facility] PROGRESS NOTE    Scott Jordan  HYI:[id number] DOB: [birth year] DOA: 12/17/2020 PCP: Earlie Server, MD      Brief Narrative:  [name] is a 70 y.o. M with stage IV small cell lung cancer on chemo, A. fib not on anticoagulation, chronic hep C, HTN, history of alcohol use and polysubstance abuse who presented with nausea and vomiting.  In the ER, he was confused.  WBC 200, platelets 7K, RR 43.  CT abdomen showed distended gallbladder with stones.  Admitted and started on antibiotics.   9/11: IR Perc drain placed in GB 9/11: Developed acute right arm weakness, MRI brain confirms embolic stroke 7/86: noted to have large pleural effusion, thoracentesis done, no malignant cells        Assessment & Plan:  Severe sepsis due to Klebsiella pneumoniae bacteremia due to cholecystitis Patient with tachypneic, leukopenia and confusion at admission.  Source gallbladder.  Patient admitted and underwent percutaneous drain on 9/11.    Completed 9 days Flagyl and 10 days IV Rocephin per ID.  Resolved.  Still has no appetite.  0-10% oral intake during his recent 48 hour calorie count.     Stage IV small cell lung cancer with metastasis to bone Chemotherapy related pancytopenia Thrombocytopenia and leukopenia resolved from admission.  Stable today.   The patient desires further chemotherapeutic treatments.  Because of his malnutrition, new CVAs, he is too weak at present to continue therapy.  If he were to strengthen, improve his appetite/oral intake, and start treatment for depressed mood, Oncology feel he would be able to resume life-prolonging treatments.    Given that rehab is the only way forward to achieve this, we recommend rehab.     New bilateral embolic CVA Developed new onset right arm weakness during this hospitalization and MRI brain was performed that showed multifocal acute embolic appearing strokes in the bilateral cerebral hemispheres.  Evaluated by  neurology, final recommendations were  No new focal deficits today - Continue aspirin - No TEE given concern for aspiration - No statin given LDL 37 - Outpatient neurology follow-up - PT recommends SNF  Paroxysmal atrial fibrillation with RVR Rate controlled.  No further procedures planned. - Stop heparin - Start Eliquis - Continue diltiazem   Left pleural effusion Underwent thoracentesis on 9/16, the symptoms resolved - Continue Lasix and spironolactone - Check BMP tomorrow   Acute kidney injury Resolved  Polysubstance use disorder  Hyponatremia Resolved  Hypokalemia Hypomagnesemia Resolved   Urinary retention Transient, foley removed.   Severe protein calorie malnutrition - Continue dronabinol - Stop Megace - Start mirtazapine  Depression - Start Prozac   Bilateral deep tissue injuries to the posterior head, present on arrival                Disposition: Status is: Inpatient  Remains inpatient appropriate because:Unsafe d/c plan  Dispo: The patient is from: Home              Anticipated d/c is to:  TBD              Patient currently is not medically stable to d/c.   Difficult to place patient No       Level of care: Med-Surg       MDM: The below labs and imaging reports were reviewed and summarized above.  Medication management as above.    DVT prophylaxis: Place and maintain sequential compression device Start: 12/18/20 1303 Place TED hose Start: 12/17/20 1523 apixaban (  ELIQUIS) tablet 5 mg  Code Status: DNR Family Communication:     Consultants:  Oncology Palliative Care Pulmonology Neurology Infectious disease Cardiology  Procedures:  9/11-PERC drain into gallbladder 9/16 left-sided thoracentesis  Antimicrobials:  Rocephin 9/11-9/18 Flagyl 9/12-9/17  Culture data:  9/13 blood culture x2, no growth to date          Subjective: Patient appears depressed and listless.  He has no complaints, says  his appetite is fine.  Has no headache, chest pain, dyspnea.  No new focal weakness or numbness.  Objective: Vitals:   12/31/20 2038 01/01/21 0500 01/01/21 0509 01/01/21 0812  BP: 107/68  (!) 123/94 135/83  Pulse: (!) 108  90 (!) 103  Resp: 20  20 18   Temp: 99 F (37.2 C)  98 F (36.7 C) (!) 97.4 F (36.3 C)  TempSrc: Oral  Oral Oral  SpO2: 95%  97% 97%  Weight:  69.8 kg    Height:        Intake/Output Summary (Last 24 hours) at 01/01/2021 1327 Last data filed at 12/31/2020 2212 Gross per 24 hour  Intake 198.54 ml  Output 475 ml  Net -276.46 ml   Filed Weights   12/24/20 1428 12/31/20 0529 01/01/21 0500  Weight: 70.8 kg 68.7 kg 69.8 kg    Examination: General appearance: Cachectic elderly male, no acute distress     HEENT: Anicteric, conjunctival pink, lids and lashes normal.  No nasal deformity, discharge, or epistaxis. Skin:  Cardiac: RRR, no murmurs, no lower extremity edema Respiratory: Normal respiratory rate and rhythm, lungs clear without rales or wheezes Abdomen: Abdomen soft without tenderness palpation or guarding, no ascites or distention MSK: Diffuse loss of subcutaneous muscle mass and fat. Neuro: Awake and alert, extraocular movements intact, moves all extremities with severe lassitude, speech fluent Psych: Sensorium intact and responding to questions, attention seems distracted, affect blunted, judgment and insight appear normal        Data Reviewed: I have personally reviewed following labs and imaging studies:  CBC: Recent Labs  Lab 12/28/20 0515 12/29/20 0925 12/30/20 0215 12/31/20 0534 01/01/21 0520  WBC 14.5* 14.7* 14.7* 12.4* 11.7*  HGB 8.5* 8.4* 8.1* 7.8* 8.0*  HCT 23.8* 24.0* 23.6* 22.8* 23.0*  MCV 97.5 96.4 97.9 96.2 96.2  PLT 394 508* 539* 549* 505*   Basic Metabolic Panel: Recent Labs  Lab 12/27/20 0615 12/28/20 0515  NA 137 136  K 3.0* 3.8  CL 108 108  CO2 25 22  GLUCOSE 98 93  BUN 12 10  CREATININE 0.57* 0.62  CALCIUM  7.3* 7.6*  MG 1.7 1.8  PHOS 2.9 2.9   GFR: Estimated Creatinine Clearance: 86 mL/min (by C-G formula based on SCr of 0.62 mg/dL). Liver Function Tests: Recent Labs  Lab 12/27/20 0615 12/28/20 0515  ALBUMIN 1.9* 1.9*   No results for input(s): LIPASE, AMYLASE in the last 168 hours. No results for input(s): AMMONIA in the last 168 hours. Coagulation Profile: No results for input(s): INR, PROTIME in the last 168 hours. Cardiac Enzymes: No results for input(s): CKTOTAL, CKMB, CKMBINDEX, TROPONINI in the last 168 hours.  BNP (last 3 results) No results for input(s): PROBNP in the last 8760 hours. HbA1C: No results for input(s): HGBA1C in the last 72 hours. CBG: No results for input(s): GLUCAP in the last 168 hours. Lipid Profile: No results for input(s): CHOL, HDL, LDLCALC, TRIG, CHOLHDL, LDLDIRECT in the last 72 hours. Thyroid Function Tests: No results for input(s): TSH, T4TOTAL, FREET4, T3FREE, THYROIDAB in  the last 72 hours. Anemia Panel: Recent Labs    12/30/20 0555  VITAMINB12 5,354*   Urine analysis:    Component Value Date/Time   COLORURINE AMBER (A) 12/17/2020 1840   APPEARANCEUR TURBID (A) 12/17/2020 1840   LABSPEC 1.030 12/17/2020 1840   PHURINE 5.0 12/17/2020 1840   GLUCOSEU 100 (A) 12/17/2020 1840   HGBUR LARGE (A) 12/17/2020 1840   BILIRUBINUR MODERATE (A) 12/17/2020 1840   KETONESUR TRACE (A) 12/17/2020 1840   PROTEINUR 100 (A) 12/17/2020 1840   NITRITE POSITIVE (A) 12/17/2020 1840   LEUKOCYTESUR NEGATIVE 12/17/2020 1840   Sepsis Labs: @LABRCNTIP (procalcitonin:4,lacticacidven:4)  ) Recent Results (from the past 240 hour(s))  CULTURE, BLOOD (ROUTINE X 2) w Reflex to ID Panel     Status: None   Collection Time: 12/22/20  2:14 PM   Specimen: BLOOD  Result Value Ref Range Status   Specimen Description BLOOD LEFT ANTECUBITAL  Final   Special Requests   Final    BOTTLES DRAWN AEROBIC AND ANAEROBIC Blood Culture adequate volume   Culture   Final     NO GROWTH 5 DAYS Performed at [facility], [address] [place], [place] [address]    Report Status 12/27/2020 FINAL  Final  CULTURE, BLOOD (ROUTINE X 2) w Reflex to ID Panel     Status: None   Collection Time: 12/22/20  2:15 PM   Specimen: BLOOD  Result Value Ref Range Status   Specimen Description BLOOD RIGHT ANTECUBITAL  Final   Special Requests   Final    BOTTLES DRAWN AEROBIC AND ANAEROBIC Blood Culture adequate volume   Culture   Final    NO GROWTH 5 DAYS Performed at [facility], [address] [place], [place] [address]    Report Status 12/27/2020 FINAL  Final  Acid Fast Smear (AFB)     Status: None   Collection Time: 12/25/20  4:30 PM   Specimen: PATH Cytology Pleural fluid  Result Value Ref Range Status   AFB Specimen Processing Concentration  Final   Acid Fast Smear Negative  Final    Comment: (NOTE) Performed At: [facility] [place], [place] [id number] Rush Farmer MD OX:[id number]    Source (AFB) PLEURAL  Final    Comment: Performed at [facility], [address] [place], [place] [address]  Body fluid culture w Gram Stain     Status: None   Collection Time: 12/25/20  4:30 PM   Specimen: PATH Cytology Pleural fluid  Result Value Ref Range Status   Specimen Description   Final    PLEURAL Performed at [facility], [address] [place], [place] [address]    Special Requests   Final    NONE Performed at [facility], [address] [place], [place] [address]    Gram Stain   Final    RARE WBC PRESENT,BOTH PMN AND MONONUCLEAR NO ORGANISMS SEEN    Culture   Final    NO GROWTH 3 DAYS Performed at [facility], [place] [address] [place], [place] [address]    Report Status 12/29/2020 FINAL  Final  Body fluid culture w Gram Stain     Status: None   Collection Time: 12/25/20  4:49 PM   Specimen: BILE; Body Fluid  Result Value Ref Range Status   Specimen Description    Final    BILE Performed at [facility], [address] [place],  [address]    Special Requests  Final    NONE Performed at [facility], [address] [place], [place] [address]    Gram Stain   Final    RARE WBC PRESENT, PREDOMINANTLY MONONUCLEAR NO ORGANISMS SEEN    Culture   Final    NO GROWTH 3 DAYS Performed at [facility], [place] [address], [place], [place] [address]    Report Status 12/29/2020 FINAL  Final         Radiology Studies: No results found.      Scheduled Meds:  sodium chloride   Intravenous Once   apixaban  5 mg Oral BID   chlorhexidine  10 mL Mouth/Throat BID   Chlorhexidine Gluconate Cloth  6 each Topical Daily   diltiazem  240 mg Oral Daily   dronabinol  5 mg Oral BID AC   FLUoxetine  10 mg Oral Daily   folic acid  1 mg Oral Daily   furosemide  20 mg Oral Daily   guaiFENesin  1,200 mg Oral BID   mirtazapine  7.5 mg Oral QHS   multivitamin with minerals  1 tablet Oral Daily   sodium chloride flush  10-40 mL Intracatheter Q12H   sodium chloride flush  5 mL Intracatheter Q8H   spironolactone  50 mg Oral Daily   thiamine  100 mg Oral Daily   Continuous Infusions:  sodium chloride Stopped (12/25/20 1558)   sodium chloride 10 mL/hr (12/20/20 1220)     LOS: 15 days    Time spent: 25 minutes    Edwin Dada, MD [facility] 01/01/2021, 1:27 PM     Please page though [place] or Epic secure chat:  For [employer], Adult nurse

## 2021-01-01 NOTE — Progress Notes (Signed)
Hematology/Oncology Progress Note [facility] [phone] [phone]  Patient Care Team: Earlie Server, MD as PCP - General (Oncology) Telford Nab, RN as Registered Nurse Erby Pian, MD as Referring Physician (Specialist) Teodoro Spray, MD as Consulting Physician (Cardiology) Anthonette Legato, MD as Consulting Physician (Nephrology) Vladimir Crofts, MD as Consulting Physician (Neurology) Meade Maw, MD as Consulting Physician (Neurosurgery)   Name of the patient: Scott Jordan  [id number]  1951/03/27  Date of visit: 01/01/21   INTERVAL HISTORY-  S/p cholecystostomy New onset of right upper extremity weakness 12/20/2020 MRI brain w wo contrast - multiple infarcts.  12/21/2020 CT angiogram head and neck showed advanced chronic microvascular ischemic changes of the white matter.  Atherosclerosis disease of the bilateral carotid bifurcation, bilateral vertebral arteries without hemodynamically significant stenosis.  Mild intracranial disease without stenosis. 12/21/2020 echocardiogram showed LVEF 60-65%. No vegetation. 12/21/20 I Have discussed and update patient's power of attorney Scott Jordan over the phone.  12/24/20 , I had a phone conversation with POA Scott and discussed about patient's poor prognosis.  Scott further discussed with patient and requested PEG tube placement.  12/25/2020 MRI brain postcontrast imaging was done.  There is no abnormal enhancement.  No intracranial metastasis.  12/25/2020, patient has very poor appetite and p.o. intake.  Severe protein calorie malnutrition.  Hypoxia.  Pulmonology is consulted.  S/p thoracentesis, pleural effusion cytology is negative for malignancy. Refused G tube placement.    Patient was seen today.  Per bedside RN, he ate a small amount of his breakfast this morning. Feels weak.  No Known Allergies  Patient Active Problem List   Diagnosis Date Noted   S/P thoracentesis     Cerebrovascular accident (CVA) due to embolism of precerebral artery ([place])    Palliative care encounter    Cholelithiasis with acute cholecystitis 12/20/2020   Cholecystitis    Septicemia due to Klebsiella pneumoniae ([place]) 12/18/2020   Neutropenic sepsis ([place]) 12/17/2020   Head trauma 06/24/2020   Ulnar neuropathy of left upper extremity 05/13/2020   Cervical radiculopathy 05/13/2020   B12 deficiency 04/06/2020   Weight loss 03/23/2020   Neuropathy of left upper extremity 03/22/2020   Hepatitis B infection without delta agent without hepatic coma 12/30/2019   Chronic hepatitis C without hepatic coma ([place]) 12/30/2019   Chronic anticoagulation 12/23/2019   Elevated LFTs 12/23/2019   Hypokalemia 12/08/2019   Acute respiratory failure with hypoxia ([place]) 11/23/2019   Heroin overdose, accidental or unintentional, initial encounter ([place]) 11/23/2019   Aspiration pneumonia ([place]) 11/23/2019   Pancytopenia ([place]) 11/23/2019   Atypical atrial flutter ([place]) 11/23/2019   Hypomagnesemia 10/21/2019   Antineoplastic chemotherapy induced anemia 10/13/2019   Dehydration 10/13/2019   Thrombocytopenia ([place]) 10/13/2019   Chemotherapy-induced neutropenia ([facility]) 10/10/2019   Anemia 09/11/2019   Cancer-related pain 09/05/2019   Encounter for antineoplastic chemotherapy 09/02/2019   Encounter for antineoplastic immunotherapy 09/02/2019   Bone metastasis ([place]) 09/02/2019   Goals of care, counseling/discussion 08/26/2019   Small cell lung cancer ([place]) 08/23/2019   Protein-calorie malnutrition, severe 08/19/2019   Hyponatremia    Dyspnea    Pleural effusion    Pleural effusion on left    Malignant pleural effusion 08/17/2019   Alcohol abuse 07/21/2019   History of substance abuse ([place]) 06/18/2019   Hypertension, essential 06/18/2019   Malnutrition of moderate degree 01/15/2019   Elevated troponin 01/14/2019     Past Medical History:  Diagnosis Date   A-fib ([place]) 01/10/2019   pt st this  was dx by  Dr. Ubaldo Glassing   Cancer [facility]) [id number]   LUNG   Complication of anesthesia    DIFFICULTY WAKING UP AFTER SURGERY- 5 YRS AGO   Hypertension    Lung cancer ([place])    Substance abuse ([place])      Past Surgical History:  Procedure Laterality Date   ARM DEBRIDEMENT Left    INCISION AND DEBRIDEMENT LOWER ARM -20 YRS AGO   BACK SURGERY     IR PERC CHOLECYSTOSTOMY  12/20/2020   PORTACATH PLACEMENT Left 08/29/2019   Procedure: INSERTION PORT-A-CATH;  Surgeon: Nestor Lewandowsky, MD;  Location: [facility] ORS;  Service: General;  Laterality: Left;    Social History   Socioeconomic History   Marital status: Single    Spouse name: Not on file   Number of children: Not on file   Years of education: Not on file   Highest education level: Not on file  Occupational History   Not on file  Tobacco Use   Smoking status: Former    Packs/day: 0.50    Types: Cigarettes   Smokeless tobacco: Never   Tobacco comments:    not smoked since 6 weeks ago  Vaping Use   Vaping Use: Never used  Substance and Sexual Activity   Alcohol use: Yes    Comment: 41 OZ IN WEEK   Drug use: Not Currently    Types: Heroin    Comment: heroin WITHIN PAST YEAR   Sexual activity: Not on file  Other Topics Concern   Not on file  Social History Narrative   Not on file   Social Determinants of Health   Financial Resource Strain: Not on file  Food Insecurity: Not on file  Transportation Needs: Not on file  Physical Activity: Not on file  Stress: Not on file  Social Connections: Not on file  Intimate Partner Violence: Not on file     History reviewed. No pertinent family history.   Current Facility-Administered Medications:    0.9 %  sodium chloride infusion (Manually program via Guardrails IV Fluids), , Intravenous, Once, Sharen Hones, MD   0.9 %  sodium chloride infusion, , Intravenous, PRN, Sharen Hones, MD, Stopped at 12/25/20 1558   0.9 %  sodium chloride infusion, , , Continuous PRN, Criselda Peaches, MD, Last Rate:  10 mL/hr at 12/20/20 1220, 10 mL/hr at 12/20/20 1220   acetaminophen (TYLENOL) tablet 650 mg, 650 mg, Oral, Q6H PRN **OR** acetaminophen (TYLENOL) suppository 650 mg, 650 mg, Rectal, Q6H PRN, Cox, Amy N, DO   albuterol (PROVENTIL) (2.5 MG/3ML) 0.083% nebulizer solution 2.5 mg, 2.5 mg, Nebulization, Q6H PRN, Ottie Glazier, MD   apixaban (ELIQUIS) tablet 5 mg, 5 mg, Oral, BID, Dallie Piles, RPH   chlorhexidine (PERIDEX) 0.12 % solution 10 mL, 10 mL, Mouth/Throat, BID, Earlie Server, MD, 10 mL at 12/28/20 2231   Chlorhexidine Gluconate Cloth 2 % PADS 6 each, 6 each, Topical, Daily, Cox, Amy N, DO, 6 each at 01/01/21 1242   diltiazem (CARDIZEM CD) 24 hr capsule 240 mg, 240 mg, Oral, Daily, Cox, Amy N, DO, 240 mg at 01/01/21 1026   diltiazem (CARDIZEM) tablet 30 mg, 30 mg, Oral, Q6H PRN, Cyndia Skeeters, Taye T, MD   dronabinol (MARINOL) capsule 5 mg, 5 mg, Oral, BID AC, Earlie Server, MD, 5 mg at 01/01/21 1317   FLUoxetine (PROZAC) capsule 10 mg, 10 mg, Oral, Daily, Danford, Suann Larry, MD   folic acid (FOLVITE) tablet 1 mg, 1 mg, Oral, Daily, Sharen Hones, MD,  1 mg at 01/01/21 1026   furosemide (LASIX) tablet 20 mg, 20 mg, Oral, Daily, Lanney Gins, Fuad, MD, 20 mg at 01/01/21 1025   guaiFENesin (MUCINEX) 12 hr tablet 1,200 mg, 1,200 mg, Oral, BID, Dorothe Pea, RPH, 1,200 mg at 01/01/21 1015   mirtazapine (REMERON) tablet 7.5 mg, 7.5 mg, Oral, QHS, Danford, Suann Larry, MD   multivitamin with minerals tablet 1 tablet, 1 tablet, Oral, Daily, Sharen Hones, MD, 1 tablet at 01/01/21 1025   ondansetron (ZOFRAN) tablet 4 mg, 4 mg, Oral, Q6H PRN **OR** ondansetron (ZOFRAN) injection 4 mg, 4 mg, Intravenous, Q6H PRN, Cox, Amy N, DO   oxyCODONE-acetaminophen (PERCOCET/ROXICET) 5-325 MG per tablet 1 tablet, 1 tablet, Oral, Q6H PRN, Mansy, Jan A, MD, 1 tablet at 12/24/20 0944   sodium chloride flush (NS) 0.9 % injection 10-40 mL, 10-40 mL, Intracatheter, Q12H, Manuella Ghazi, Vipul, MD, 10 mL at 01/01/21 1246   sodium chloride  flush (NS) 0.9 % injection 10-40 mL, 10-40 mL, Intracatheter, PRN, Manuella Ghazi, Vipul, MD   sodium chloride flush (NS) 0.9 % injection 5 mL, 5 mL, Intracatheter, Q8H, Laurence Ferrari, Heath K, MD, 5 mL at 01/01/21 1318   spironolactone (ALDACTONE) tablet 50 mg, 50 mg, Oral, Daily, Aleskerov, Fuad, MD, 50 mg at 01/01/21 1025   thiamine tablet 100 mg, 100 mg, Oral, Daily, Sharen Hones, MD, 100 mg at 01/01/21 1026  Facility-Administered Medications Ordered in Other Encounters:    sodium chloride flush (NS) 0.9 % injection 10 mL, 10 mL, Intravenous, PRN, Nolon Stalls C, MD, 10 mL at 11/11/19 0812   Physical exam:  Vitals:   12/31/20 2038 01/01/21 0500 01/01/21 0509 01/01/21 0812  BP: 107/68  (!) 123/94 135/83  Pulse: (!) 108  90 (!) 103  Resp: 20  20 18   Temp: 99 F (37.2 C)  98 F (36.7 C) (!) 97.4 F (36.3 C)  TempSrc: Oral  Oral Oral  SpO2: 95%  97% 97%  Weight:  153 lb 14.1 oz (69.8 kg)    Height:       Physical Exam Constitutional:      General: He is not in acute distress.    Appearance: He is not diaphoretic.  HENT:     Head: Normocephalic and atraumatic.     Nose: Nose normal.     Mouth/Throat:     Pharynx: No oropharyngeal exudate.  Eyes:     General: No scleral icterus.    Pupils: Pupils are equal, round, and reactive to light.  Cardiovascular:     Rate and Rhythm: Normal rate and regular rhythm.     Heart sounds: No murmur heard. Pulmonary:     Effort: Pulmonary effort is normal. No respiratory distress.     Breath sounds: No rales.  Chest:     Chest wall: No tenderness.  Abdominal:     General: There is no distension.     Palpations: Abdomen is soft.     Tenderness: There is no abdominal tenderness.     Comments: cholecystostomy tube  Musculoskeletal:        General: Normal range of motion.     Cervical back: Normal range of motion and neck supple.  Skin:    General: Skin is warm and dry.     Findings: No erythema.  Neurological:     Mental Status: He is  alert. Mental status is at baseline.     Cranial Nerves: No cranial nerve deficit.     Motor: No abnormal muscle tone.  Comments: Decreased right upper extremity strength.  3-4/5  Psychiatric:        Mood and Affect: Affect normal.     Comments: depressed       CMP Latest Ref Rng & Units 12/28/2020  Glucose 70 - 99 mg/dL 93  BUN 8 - 23 mg/dL 10  Creatinine 0.61 - 1.24 mg/dL 0.62  Sodium 135 - 145 mmol/L 136  Potassium 3.5 - 5.1 mmol/L 3.8  Chloride 98 - 111 mmol/L 108  CO2 22 - 32 mmol/L 22  Calcium 8.9 - 10.3 mg/dL 7.6(L)  Total Protein 6.5 - 8.1 g/dL -  Total Bilirubin 0.3 - 1.2 mg/dL -  Alkaline Phos 38 - 126 U/L -  AST 15 - 41 U/L -  ALT 0 - 44 U/L -   CBC Latest Ref Rng & Units 01/01/2021  WBC 4.0 - 10.5 K/uL 11.7(H)  Hemoglobin 13.0 - 17.0 g/dL 8.0(L)  Hematocrit 39.0 - 52.0 % 23.0(L)  Platelets 150 - 400 K/uL 536(H)    RADIOGRAPHIC STUDIES: I have personally reviewed the radiological images as listed and agreed with the findings in the report. CT ABDOMEN PELVIS WO CONTRAST  Result Date: 12/17/2020 CLINICAL DATA:  Nausea and vomiting EXAM: CT ABDOMEN AND PELVIS WITHOUT CONTRAST TECHNIQUE: Multidetector CT imaging of the abdomen and pelvis was performed following the standard protocol without IV contrast. COMPARISON:  CT 06/23/2020, PET CT 11/19/2020 FINDINGS: Lower chest: Lung bases demonstrate emphysema. Subpleural bandlike airspace disease at the left lower lobe redemonstrated. Incompletely visualized subpleural peripheral left upper lobe nodule measuring 11 mm, series 5, image 1. Cardiomegaly with trace pericardial effusion. Coronary vascular calcification. Hepatobiliary: Extensive motion degradation. No focal hepatic abnormality. Gallstones. Gallbladder slightly distended. Possible wall thickening or pericholecystic fluid though limited by motion Pancreas: Unremarkable. No pancreatic ductal dilatation or surrounding inflammatory changes. Spleen: Normal in size without  focal abnormality. Adrenals/Urinary Tract: Thickened appearance of adrenal glands. No hydronephrosis. The bladder is unremarkable Stomach/Bowel: Stomach nonenlarged. No dilated small bowel. Negative appendix. No acute bowel wall thickening Vascular/Lymphatic: Advanced aortic atherosclerosis. No aneurysm. Slightly increased multiple small retroperitoneal lymph nodes. Reproductive: Prostate is unremarkable. Other: Negative for free air or free fluid Musculoskeletal: No acute osseous abnormality. IMPRESSION: 1. Degraded by motion. 2. Gallbladder appears slightly distended and there are gallstones. Possible gallbladder wall thickening or pericholecystic fluid though limited by motion, consider correlation with ultrasound 3. Cardiomegaly with trace pericardial effusion. 4. Similar subpleural bandlike density at left lower lobe. Incompletely visualized peripheral left upper lobe lung nodule characterized on recent PET CT Electronically Signed   By: Donavan Foil M.D.   On: 12/17/2020 16:50   CT ANGIO HEAD NECK W WO CM  Result Date: 12/21/2020 CLINICAL DATA:  Neuro deficit, acute, stroke suspected. Neutropenic sepsis. EXAM: CT ANGIOGRAPHY HEAD AND NECK TECHNIQUE: Multidetector CT imaging of the head and neck was performed using the standard protocol during bolus administration of intravenous contrast. Multiplanar CT image reconstructions and MIPs were obtained to evaluate the vascular anatomy. Carotid stenosis measurements (when applicable) are obtained utilizing NASCET criteria, using the distal internal carotid diameter as the denominator. CONTRAST:  84mL OMNIPAQUE IOHEXOL 350 MG/ML SOLN COMPARISON:  MRI of the brain December 20, 2020. FINDINGS: CT HEAD FINDINGS Brain: Confluent hypodensity of the white matter of the cerebral hemispheres, nonspecific, most likely related to chronic small vessel ischemia. Foci of acute infarct are more conspicuous on recent MRI of the brain. No new large territorial infarct. No  hemorrhage, hydrocephalus, mass or midline shift. Vascular: No  hyperdense vessel or unexpected calcification. Skull: Normal. Negative for fracture or focal lesion. Sinuses: Increased thickness of the walls of the bilateral maxillary sinuses with bubbly secretion on the left. Findings are suggestive of chronic sinusitis. Right mastoid effusion. Orbits: No acute finding. Review of the MIP images confirms the above findings CTA NECK FINDINGS Aortic arch: Standard branching. Imaged portion shows no evidence of aneurysm or dissection. Mild atherosclerotic changes of the aortic arch without significant stenosis of the major arch vessel origins. Right carotid system: Calcified atherosclerotic disease of the right carotid bifurcation without hemodynamically significant stenosis. Left carotid system: Atherosclerotic disease of the left carotid bifurcation with mixed density plaques without hemodynamically significant stenosis. Vertebral arteries: Calcified atherosclerotic plaque at the origin of the right vertebral artery without hemodynamically significant stenosis. Mild atherosclerotic changes at the origin of the left vertebral artery without hemodynamically significant stenosis. Cervical vertebral arteries otherwise have normal caliber Skeleton: Degenerative changes of the cervical spine with fusion of the facet joints at C2-3 on the left. No acute findings. Other neck: Negative. Upper chest: Left-sided pleural effusion and atelectasis. Mediastinal lymphadenopathy. Review of the MIP images confirms the above findings CTA HEAD FINDINGS Anterior circulation: Calcified atherosclerotic plaques in the bilateral carotid siphons. No significant stenosis, proximal occlusion, aneurysm, or vascular malformation. Posterior circulation: No significant stenosis, proximal occlusion, aneurysm, or vascular malformation. Hypoplastic/aplastic right P1/PCA segment with right fetal PCA. Venous sinuses: As permitted by contrast timing,  patent. Anatomic variants: Right fetal PCA. Review of the MIP images confirms the above findings IMPRESSION: 1. Advanced chronic microvascular ischemic changes of the white matter. 2. Atherosclerotic disease of the bilateral carotid bifurcation without hemodynamically significant stenosis. 3. Mild atherosclerotic changes at the origin of the bilateral vertebral arteries without hemodynamically significant stenosis. 4. Mild intracranial atherosclerotic disease without hemodynamically significant stenosis. Electronically Signed   By: Pedro Earls M.D.   On: 12/21/2020 15:32   DG Chest 1 View  Result Date: 12/25/2020 CLINICAL DATA:  Thoracentesis.  Lung cancer EXAM: CHEST  1 VIEW COMPARISON:  Radiograph 12/25/2020 FINDINGS: Interval reduction in LEFT pleural fluid following thoracentesis. No pneumothorax appreciated. A small basilar effusion remains. RIGHT lung clear. Port in the anterior chest wall with tip in distal SVC. IMPRESSION: 1. No pneumothorax appreciated following LEFT thoracentesis. 2. Considerable reduction in LEFT pleural fluid. Electronically Signed   By: Suzy Bouchard M.D.   On: 12/25/2020 17:28   DG Chest 2 View  Result Date: 12/27/2020 CLINICAL DATA:  Status post thoracentesis 2 days prior. EXAM: CHEST - 2 VIEW COMPARISON:  Radiograph same day (12/27/2020 at 830 hours FINDINGS: Persistent near complete opacification of the LEFT hemithorax. Small amount of aerated lung remains at LEFT lung apex. Port in place. RIGHT lung clear. IMPRESSION: No interval change in near complete opacification LEFT hemithorax. Electronically Signed   By: Suzy Bouchard M.D.   On: 12/27/2020 18:17   CT HEAD WO CONTRAST (5MM)  Result Date: 12/17/2020 CLINICAL DATA:  Nausea vomiting EXAM: CT HEAD WITHOUT CONTRAST TECHNIQUE: Contiguous axial images were obtained from the base of the skull through the vertex without intravenous contrast. COMPARISON:  CT brain 06/24/2020, MRI 03/10/2020, head CT  11/22/2019 FINDINGS: Brain: No acute territorial infarction, hemorrhage or intracranial mass. Extensive white matter disease, progressed from more remote exams from 2021. Stable ventricle size. Chronic lacunar infarcts within the left white matter. Vascular: No hyperdense vessel.  Carotid vascular calcification Skull: Normal. Negative for fracture or focal lesion. Sinuses/Orbits: Mucosal thickening the sinuses. Chronic appearing nasal  deformity Other: None IMPRESSION: 1. No definite CT evidence for acute intracranial abnormality. 2. Atrophy. Extensive white matter disease, grossly stable as compared with recent head CT from March, appears progressive when compared to exams from 2021 and prior Electronically Signed   By: Donavan Foil M.D.   On: 12/17/2020 16:39   MR BRAIN W WO CONTRAST  Addendum Date: 12/21/2020   ADDENDUM REPORT: 12/21/2020 14:49 ADDENDUM: Patient returns for repeat postcontrast imaging. There is no abnormal enhancement. No evidence of intracranial metastatic disease. Electronically Signed   By: Macy Mis M.D.   On: 12/21/2020 14:49   Result Date: 12/21/2020 CLINICAL DATA:  Neuro deficit, acute, stroke suspected Small cell lung cancer, monitor EXAM: MRI HEAD WITHOUT AND WITH CONTRAST TECHNIQUE: Multiplanar, multiecho pulse sequences of the brain and surrounding structures were obtained without and with intravenous contrast. CONTRAST:  7.32mL GADAVIST GADOBUTROL 1 MMOL/ML IV SOLN COMPARISON:  CT head 12/17/2020. FINDINGS: Mildly motion limited exam.  Within this limitation: Brain: Multiple small areas of restricted diffusion in the bilateral frontal and occipital, and left parietal matter, left perirolandic cortex, left thalamocapsular region, and left cerebellum. Advanced confluent T2 hyperintensity within the white matter, nonspecific but compatible with chronic microvascular ischemic disease and progressed since 2021. Small remote infarct in the left corona radiata. No hydrocephalus,  obvious mass lesion, midline shift, acute hemorrhage, or extra-axial fluid collection. Punctate foci of susceptibility artifact in the left cerebellum and left frontal white matter, nonspecific but potentially related to prior microhemorrhage. Contrast was administered; however, there is no evidence of enhancement (for example, the nasal mucosa/turbinates are nonenhancing). This precludes evaluation for enhancing lesions. Vascular: Major arterial flow voids are maintained at the skull base. Skull and upper cervical spine: Normal marrow signal. Sinuses/Orbits: Mild paranasal sinus disease. Other: Moderate right and small left mastoid effusions. IMPRESSION: 1. Multiple small areas of restricted diffusion in the bilateral frontal and occipital and left parietal white matter, left perirolandic cortex, left thalamocapsular region, and left cerebellum, which are concerning for acute infarcts. Given involvement of multiple vascular territories, consider a central embolic etiology. 2. Contrast was administered; however, there is no evidence of enhancement, unclear why given reported uneventful injection per the technologist. This precludes evaluation for enhancing lesions. Given the patient's known small cell lung cancer diagnosis, recommend repeat postcontrast imaging to exclude metastatic disease. I've asked the MRI technologist to not bill the patient for the repeat post-contrast imaging. 3. Advanced chronic microvascular ischemic disease, progressed since 2021. 4. Moderate right and small left mastoid effusions. Electronically Signed: By: Margaretha Sheffield M.D. On: 12/20/2020 14:29   IR Perc Cholecystostomy  Result Date: 12/20/2020 INDICATION: 70 year old male with small cell lung cancer and chemotherapy induced pancytopenia presenting with neutropenic sepsis. Blood cultures grew out Klebsiella and imaging findings are concerning for acute cholecystitis. He is too sick and frail to be considered an operative  candidate. Following transfusion of platelets, his platelets are now at an acceptable level to proceed with percutaneous cholecystostomy tube placement. EXAM: CHOLECYSTOSTOMY MEDICATIONS: In patient currently receiving intravenous antibiotic therapy. No additional antibiotic prophylaxis administered. ANESTHESIA/SEDATION: Moderate (conscious) sedation was employed during this procedure. A total of Versed 1 mg and Fentanyl 50 mcg and 0.5 mg Dilaudid administered intravenously. Moderate Sedation Time: 13 minutes. The patient's level of consciousness and vital signs were monitored continuously by radiology nursing throughout the procedure under my direct supervision. FLUOROSCOPY TIME:  Fluoroscopy Time: 0 minutes 30 seconds (4.3 mGy). COMPLICATIONS: None immediate. PROCEDURE: Informed written consent was obtained from the  patient after a thorough discussion of the procedural risks, benefits and alternatives. All questions were addressed. Maximal Sterile Barrier Technique was utilized including caps, mask, sterile gowns, sterile gloves, sterile drape, hand hygiene and skin antiseptic. A timeout was performed prior to the initiation of the procedure. The right upper quadrant was interrogated with ultrasound. The distended gallbladder with a thickened wall was successfully identified. There is significant excursion of the liver with respiration due to use of abdominal muscles. A suitable skin entry site was selected. Local anesthesia was attained by infiltration with 1% lidocaine. A small dermatotomy was made. Under real-time ultrasound guidance, the movement of the liver was timed with respirations and in a single pass, a 21 gauge Accustick needle was advanced through the liver and into the gallbladder lumen along a short transhepatic course. A 0.018 wire was quickly advanced into the gallbladder lumen and the needle was exchanged for the transitional dilator. The transitional dilator was advanced into the gallbladder  lumen. The wire was removed. Contrast injection was performed under fluoroscopy confirming opacification of the gallbladder lumen. Multiple filling defects consistent with cholelithiasis. The cystic duct is occluded. A 0.035 wire was then coiled within the gallbladder lumen. The transitional dilator was removed. The transhepatic tract was dilated to 10 [place] with a stiff dilator. A Cook 10.2 [place] all-purpose drainage catheter was then advanced over the wire and formed in the gallbladder lumen. Images were obtained and stored for the medical record. The catheter was gently flushed and connected to gravity bag drainage before being secured to the skin with 0 silk suture. IMPRESSION: Successful placement of 10 French transhepatic percutaneous cholecystostomy tube for an indication of calculus cholecystitis. PLAN: Maintain tube to gravity bag drainage. Patient will likely need home health to assist with care and cleaning of the tube site. Return to interventional radiology approximately every 8 weeks for cholecystostomy tube check and exchange. If patient's clinical status improves in the future, elective cholecystectomy would be optimal. If patient remains a poor operative candidate, long-term cholecystostomy tube maintenance will likely be required. Electronically Signed   By: Jacqulynn Cadet M.D.   On: 12/20/2020 13:50   DG Chest Port 1 View  Result Date: 12/27/2020 CLINICAL DATA:  Shortness of breath with atelectasis common thoracentesis 2 days ago EXAM: PORTABLE CHEST 1 VIEW COMPARISON:  Chest radiograph 12/25/2020 FINDINGS: The left chest wall port is in stable position with the tip terminating in the region of the right atrium. The cardiomediastinal silhouette can not be assessed. The left pleural effusion has increased in size with worsening aeration of the left upper lobe. Aeration of the right lung is not significantly changed. There is no significant right effusion. There is no appreciable  pneumothorax. There is no acute osseous abnormality. Enteric contrast is noted in the colon. IMPRESSION: Increased size of the large left pleural effusion with worsened aeration of the left lung. Electronically Signed   By: Valetta Mole M.D.   On: 12/27/2020 09:12   DG Chest Port 1 View  Result Date: 12/25/2020 CLINICAL DATA:  Shortness of breath EXAM: PORTABLE CHEST 1 VIEW COMPARISON:  Chest x-ray 12/17/2020, PET CT 11/19/2020 FINDINGS: Left-sided central venous port tip over right atrium. Interval complete opacification of left thorax, suspect that there is at least moderate pleural effusion. Cardiomediastinal silhouette is obscured. No pneumothorax is visualized. IMPRESSION: Interval opacification of the entire left thorax which may be due to combination of pleural effusion/atelectasis/airspace disease. Electronically Signed   By: Donavan Foil M.D.   On:  12/25/2020 15:19   DG Chest Port 1 View  Result Date: 12/17/2020 CLINICAL DATA:  Questionable sepsis.  Weakness EXAM: PORTABLE CHEST 1 VIEW COMPARISON:  03/09/2020 FINDINGS: Left subclavian approached chest port remains in place. Stable cardiomegaly. Chronic left basilar scarring or atelectasis. Peripheral left upper lobe nodule better seen on prior CT. No new airspace consolidation. Pleural effusion or pneumothorax. IMPRESSION: Chronic left basilar scarring or atelectasis. No new or acute findings. Electronically Signed   By: Davina Poke D.O.   On: 12/17/2020 13:20   DG Swallowing Func-Speech Pathology  Result Date: 12/25/2020 Table formatting from the original result was not included. Objective Swallowing Evaluation: Type of Study: MBS-Modified Barium Swallow Study  Patient Details Name: MERYL PONDER MRN: [id number] Date of Birth: 02/26/1951 Today's Date: 12/25/2020 Time: SLP Start Time (ACUTE ONLY): 65 -SLP Stop Time (ACUTE ONLY): 1300 SLP Time Calculation (min) (ACUTE ONLY): 30 min Past Medical History: Past Medical History: Diagnosis Date   A-fib ([place]) 01/10/2019  pt st this was dx by Dr. Ubaldo Glassing  Cancer [facility]) [id number]  LUNG  Complication of anesthesia   DIFFICULTY WAKING UP AFTER SURGERY- 56 YRS AGO  Hypertension   Lung cancer ([place])   Substance abuse ([place])  Past Surgical History: Past Surgical History: Procedure Laterality Date  ARM DEBRIDEMENT Left   INCISION AND DEBRIDEMENT LOWER ARM -20 YRS AGO  BACK SURGERY    IR PERC CHOLECYSTOSTOMY  12/20/2020  PORTACATH PLACEMENT Left 08/29/2019  Procedure: INSERTION PORT-A-CATH;  Surgeon: Nestor Lewandowsky, MD;  Location: [facility] ORS;  Service: General;  Laterality: Left; HPI: Pt is a 70 year old male with PMH of 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 fib not on AC, chronic hep C, chronic cancer pain, HTN, alcohol abuse, malnutrition and polysubstance who is admitted with neutropenic sepsis due to acute cholecystitis and Klebsiella bacteremia now s/p cholecystostomy on 9/11 by IR. Hospital admission complicated by acute thromboembolic stroke with new right-sided weakness-confirmed on MRI brain on 12/20/2020.  MRI: Multiple small areas of restricted diffusion in the bilateral  frontal and occipital and left parietal white matter, left  perirolandic cortex, left thalamocapsular region, and left  cerebellum, which are concerning for acute infarcts.  CXR at admit: Chronic left basilar scarring or atelectasis. No new or acute  findings.  Pt is followed by [facility] at [facility].  Subjective: pt able to communicate his wants/needs Assessment / Plan / Recommendation CHL IP CLINICAL IMPRESSIONS 12/25/2020 Clinical Impression Pt presents with adequate airway protection while consuming thin liquids, nectar thick liquids, puree and whole barium tablet with thin liquids. Pt's oral phase is unremarkable and his swallow response is timely with good airway protection. His overall medical comorbidities and respiratory decline place him at an overall increased risk of aspiration. For  example, when consuming consecutive sips of thin liquids, pt became short of breath. At this time, recommend pt continue consuming dysphagia 3 diet for energy conservation with thin liquids, medicine whole with thin liquids. Pt needs to follow strict aspiration precautions such as taking single sips and rest breaks. SLP Visit Diagnosis Dysphagia, oropharyngeal phase (R13.12) Attention and concentration deficit following -- Frontal lobe and executive function deficit following -- Impact on safety and function Mild aspiration risk;Moderate aspiration risk   CHL IP TREATMENT RECOMMENDATION 12/25/2020 Treatment Recommendations No treatment recommended at this time   Prognosis 12/24/2020 Prognosis for Safe Diet Advancement [place] Barriers to Reach Goals Motivation;Time post onset;Severity of deficits;Behavior Barriers/Prognosis Comment -- CHL IP DIET RECOMMENDATION  12/25/2020 SLP Diet Recommendations Dysphagia 3 (Mech soft) solids;Thin liquid Liquid Administration via Cup Medication Administration Whole meds with liquid Compensations Minimize environmental distractions;Slow rate;Small sips/bites Postural Changes Remain semi-upright after after feeds/meals (Comment);Seated upright at 90 degrees   CHL IP OTHER RECOMMENDATIONS 12/25/2020 Recommended Consults -- Oral Care Recommendations Oral care BID Other Recommendations --   CHL IP FOLLOW UP RECOMMENDATIONS 12/25/2020 Follow up Recommendations None   CHL IP FREQUENCY AND DURATION 12/24/2020 Speech Therapy Frequency (ACUTE ONLY) min 3x week Treatment Duration 2 weeks      CHL IP ORAL PHASE 12/25/2020 Oral Phase WFL Oral - Pudding Teaspoon -- Oral - Pudding Cup -- Oral - Honey Teaspoon -- Oral - Honey Cup -- Oral - Nectar Teaspoon -- Oral - Nectar Cup -- Oral - Nectar Straw -- Oral - Thin Teaspoon -- Oral - Thin Cup -- Oral - Thin Straw -- Oral - Puree -- Oral - Mech Soft -- Oral - Regular -- Oral - Multi-Consistency -- Oral - Pill -- Oral Phase - Comment --  CHL IP PHARYNGEAL PHASE  12/25/2020 Pharyngeal Phase WFL Pharyngeal- Pudding Teaspoon -- Pharyngeal -- Pharyngeal- Pudding Cup -- Pharyngeal -- Pharyngeal- Honey Teaspoon -- Pharyngeal -- Pharyngeal- Honey Cup -- Pharyngeal -- Pharyngeal- Nectar Teaspoon -- Pharyngeal -- Pharyngeal- Nectar Cup -- Pharyngeal -- Pharyngeal- Nectar Straw -- Pharyngeal -- Pharyngeal- Thin Teaspoon -- Pharyngeal -- Pharyngeal- Thin Cup -- Pharyngeal -- Pharyngeal- Thin Straw -- Pharyngeal -- Pharyngeal- Puree -- Pharyngeal -- Pharyngeal- Mechanical Soft -- Pharyngeal -- Pharyngeal- Regular -- Pharyngeal -- Pharyngeal- Multi-consistency -- Pharyngeal -- Pharyngeal- Pill -- Pharyngeal -- Pharyngeal Comment --  CHL IP CERVICAL ESOPHAGEAL PHASE 12/25/2020 Cervical Esophageal Phase WFL Pudding Teaspoon -- Pudding Cup -- Honey Teaspoon -- Honey Cup -- Nectar Teaspoon -- Nectar Cup -- Nectar Straw -- Thin Teaspoon -- Thin Cup -- Thin Straw -- Puree -- Mechanical Soft -- Regular -- Multi-consistency -- Pill -- Cervical Esophageal Comment -- Happi B. [name] M.S., CCC-SLP, [place] Office [phone] Stormy Fabian 12/25/2020, 2:17 PM              ECHOCARDIOGRAM COMPLETE  Result Date: 12/21/2020    ECHOCARDIOGRAM REPORT   Patient Name:   JAMIL ARMWOOD Date of Exam: 12/21/2020 Medical Rec #:  [id number]    Height:       72.0 in Accession #:    [id number]   Weight:       137.1 lb Date of Birth:  Oct 18, 1950     BSA:          1.815 m Patient Age:    3 years     BP:           114/80 mmHg Patient Gender: M            HR:           89 bpm. Exam Location:  [facility] Procedure: 2D Echo, Color Doppler and Cardiac Doppler Indications:     Endocarditis I38  History:         Patient has prior history of Echocardiogram examinations, most                  recent 08/19/2019. Arrythmias:Atrial Fibrillation; Risk                  Factors:Hypertension. Substance abuse.  Sonographer:     Sherrie Sport Referring Phys:  [place] Diagnosing Phys:  Serafina Royals MD IMPRESSIONS  1. Left ventricular ejection fraction, by  estimation, is 60 to 65%. The left ventricle has normal function. The left ventricle has no regional wall motion abnormalities. Left ventricular diastolic parameters were normal.  2. Right ventricular systolic function is normal. The right ventricular size is normal.  3. The mitral valve is normal in structure. Trivial mitral valve regurgitation.  4. The aortic valve is normal in structure. Aortic valve regurgitation is not visualized. Mild aortic valve sclerosis is present, with no evidence of aortic valve stenosis. Conclusion(s)/Recommendation(s): No evidence of valve vegetations. FINDINGS  Left Ventricle: Left ventricular ejection fraction, by estimation, is 60 to 65%. The left ventricle has normal function. The left ventricle has no regional wall motion abnormalities. The left ventricular internal cavity size was small. There is no left ventricular hypertrophy. Left ventricular diastolic parameters were normal. Right Ventricle: The right ventricular size is normal. No increase in right ventricular wall thickness. Right ventricular systolic function is normal. Left Atrium: Left atrial size was normal in size. Right Atrium: Right atrial size was normal in size. Pericardium: There is no evidence of pericardial effusion. Mitral Valve: The mitral valve is normal in structure. Trivial mitral valve regurgitation. Tricuspid Valve: The tricuspid valve is normal in structure. Tricuspid valve regurgitation is trivial. Aortic Valve: The aortic valve is normal in structure. Aortic valve regurgitation is not visualized. Mild aortic valve sclerosis is present, with no evidence of aortic valve stenosis. Aortic valve mean gradient measures 3.0 mmHg. Aortic valve peak gradient measures 5.9 mmHg. Aortic valve area, by VTI measures 2.30 cm. Pulmonic Valve: The pulmonic valve was normal in structure. Pulmonic valve regurgitation is not visualized. Aorta: The  aortic root and ascending aorta are structurally normal, with no evidence of dilitation. IAS/Shunts: No atrial level shunt detected by color flow Doppler.  LEFT VENTRICLE PLAX 2D LVIDd:         3.87 cm LVIDs:         2.30 cm LV PW:         1.00 cm LV IVS:        1.18 cm LVOT diam:     2.10 cm LV SV:         44 LV SV Index:   24 LVOT Area:     3.46 cm  RIGHT VENTRICLE RV Basal diam:  3.70 cm RV S prime:     12.40 cm/s TAPSE (M-mode): 3.7 cm LEFT ATRIUM           Index       RIGHT ATRIUM           Index LA diam:      3.50 cm 1.93 cm/m  RA Area:     33.70 cm LA Vol (A2C): 57.4 ml 31.63 ml/m RA Volume:   128.00 ml 70.53 ml/m LA Vol (A4C): 72.1 ml 39.73 ml/m  AORTIC VALVE                   PULMONIC VALVE AV Area (Vmax):    2.34 cm    PV Vmax:        0.60 m/s AV Area (Vmean):   2.32 cm    PV Peak grad:   1.4 mmHg AV Area (VTI):     2.30 cm    RVOT Peak grad: 2 mmHg AV Vmax:           121.00 cm/s AV Vmean:          86.000 cm/s AV VTI:            0.193  m AV Peak Grad:      5.9 mmHg AV Mean Grad:      3.0 mmHg LVOT Vmax:         81.60 cm/s LVOT Vmean:        57.600 cm/s LVOT VTI:          0.128 m LVOT/AV VTI ratio: 0.66  AORTA Ao Root diam: 3.50 cm MITRAL VALVE               TRICUSPID VALVE MV Area (PHT): 4.52 cm    TR Peak grad:   24.4 mmHg MV Decel Time: 168 msec    TR Vmax:        247.00 cm/s MV E velocity: 88.30 cm/s                            SHUNTS                            Systemic VTI:  0.13 m                            Systemic Diam: 2.10 cm Serafina Royals MD Electronically signed by Serafina Royals MD Signature Date/Time: 12/21/2020/5:20:09 PM    Final    US Abdomen Limited RUQ (LIVER/GB)  Result Date: 12/18/2020 CLINICAL DATA:  Evaluate for cholecystitis. EXAM: ULTRASOUND ABDOMEN LIMITED RIGHT UPPER QUADRANT COMPARISON:  CT AP from 12/17/2020 FINDINGS: Gallbladder: The gallbladder wall is diffusely edematous measuring up to 13.1 mm. Gallstones are noted measuring up to 5.9 mm. Pericholecystic fluid.  Positive sonographic Murphy's sign. Common bile duct: Diameter: 6 mm Liver: Diffusely increased parenchymal echogenicity identified. No focal liver abnormality. Portal vein is patent on color Doppler imaging with normal direction of blood flow towards the liver. Other: 1.9 cm right kidney cyst. IMPRESSION: Gallstones, gallbladder wall thickening and pericholecystic fluid with positive sonographic Murphy's sign. Findings are compatible with acute cholecystitis. Electronically Signed   By: Kerby Moors M.D.   On: 12/18/2020 14:18   US THORACENTESIS ASP PLEURAL SPACE W/IMG GUIDE  Result Date: 12/25/2020 INDICATION: Large left pleural effusion secondary to known lung cancer. Request for diagnostic and therapeutic thoracentesis. EXAM: ULTRASOUND GUIDED LEFT THORACENTESIS MEDICATIONS: 1% lidocaine 10 mL COMPLICATIONS: None immediate. PROCEDURE: An ultrasound guided thoracentesis was thoroughly discussed with the patient and questions answered. The benefits, risks, alternatives and complications were also discussed. The patient understands and wishes to proceed with the procedure. Written consent was obtained. Ultrasound was performed to localize and mark an adequate pocket of fluid in the left chest. The area was then prepped and draped in the normal sterile fashion. 1% Lidocaine was used for local anesthesia. Under ultrasound guidance a 6 Fr Safe-T-Centesis catheter was introduced. Thoracentesis was performed. The catheter was removed and a dressing applied. FINDINGS: A total of approximately 700 mL of hazy yellow fluid was removed. Samples were sent to the laboratory as requested by the clinical team. IMPRESSION: Successful ultrasound guided left thoracentesis yielding 700 mL of pleural fluid. Read by: Gareth Eagle, PA-C Electronically Signed   By: Michaelle Birks M.D.   On: 12/25/2020 16:44    Assessment and plan-   #Acute embolic stroke Echocardiogram showed no vegetation.   Cardiology recommend to defer  TEE  Atrial fibrillation, resumed on Eliquis 5 mg twice daily.  # Left pleural effusion, s/p diagnostic and therapeutic thoracentesis.  Cytology  is negative for malignancy.  # Extensive small cell lung caner,  S/p 1 cycle of carboplatin/etoposide/Tecentriq.   Poor prognosis long-term.  Incurable condition. Goals of care, Patient has very poor oral intake and is deconditioned.  We discussed that he does not have treatment option currently due to his poor performance status/weakness. Patient says he will try to eat more.  I also encouraged the patient to participate in physical therapy.   # Decreased oral intake, deconditioning, protein calorie malnutrition.  Marinol to 5mg  BID. Refuses G tube.  Remeron at bedtime.   # Depression, discussed with hospitalist.  Prozac has been ordered.  PT/OT -rehab Code Status DNR Patient can follow-up outpatient. Thank you for allowing me to participate in the care of this patient.   Earlie Server, MD, PhD 01/01/2021

## 2021-01-01 NOTE — Progress Notes (Signed)
Scott Jordan for Heparin Infusion Indication: Atrial fibrillation and recent stroke  No Known Allergies  Patient Measurements: Height: 6' (182.9 cm) Weight: 69.8 kg (153 lb 14.1 oz) IBW/kg (Calculated) : 77.6  Vital Signs: Temp: 98 F (36.7 C) (09/23 0509) Temp Source: Oral (09/23 0509) BP: 123/94 (09/23 0509) Pulse Rate: 90 (09/23 0509)  Labs: Recent Labs    12/30/20 0215 12/30/20 0555 12/30/20 1342 12/31/20 0534 01/01/21 0520  HGB 8.1*  --   --  7.8* 8.0*  HCT 23.6*  --   --  22.8* 23.0*  PLT 539*  --   --  549* 536*  HEPARINUNFRC 0.46   < > 0.50 0.38 0.38   < > = values in this interval not displayed.     Estimated Creatinine Clearance: 86 mL/min (by C-G formula based on SCr of 0.62 mg/dL).   Medications:  Eliquis 5mg  BID - last dose 9/15 2017  Assessment: 70 year old M with PMH of stage IV small cell lung cancer on chemo, chemo induced pancytopenia, A. fib, chronic hep C, chronic cancer pain, HTN, alcohol abuse, malnutrition and polysubstance use presenting with nausea and vomiting and admitted with neutropenic sepsis due to acute cholecystitis and Klebsiella bacteremia. Pt with acute stroke confirmed on MRI 9/11. Pharmacy has been consulted for heparin dosing/monitoring.   Goal of Therapy:  Heparin level 0.3-0.5 units/ml - confirmed with MD to utilize stroke goals Monitor platelets by anticoagulation protocol: Yes  Date Time HL  9/21  0215  0.46, therapeutic x 1 9/21 0555 0.42, early draw, repeat HL 9/21 1342 0.50, therapeutic x 2 9/22 0534 0.38, therapeutic 9/23 0520 0.38, therapeutic   Per protocol, Heparin level can be checked less frequently (daily) after second therapeutic measurement.   Plan:  Continue Heparin infusion rate at 2150 u/hr Recheck Heparin level in AM CBC daily while on Heparin  Renda Rolls, PharmD, [facility] 01/01/2021 6:40 AM

## 2021-01-01 NOTE — Care Management Important Message (Signed)
Important Message  Patient Details  Name: Scott Jordan MRN: [id number] Date of Birth: 1950/08/31   Medicare Important Message Given:  Yes     Dannette Barbara 01/01/2021, 3:12 PM

## 2021-01-01 NOTE — TOC Progression Note (Addendum)
Transition of Care [facility]) - Progression Note    Patient Details  Name: Scott Jordan MRN: [id number] Date of Birth: 05-06-1950  Transition of Care [facility]) CM/SW [place], LCSW Phone Number: 01/01/2021, 1:07 PM  Clinical Narrative:      CSW started insurance auth in [place] portal. Notified Otila Kluver and Tammy with [facility] that per MD plan is still for SNF. Asked when they will have a bed. Waiting for a response.   1:57- Spoke to Harvey, she confirmed they can take patient over the weekend pending auth.     Expected Discharge Plan: [place] Barriers to Discharge: Continued Medical Work up  Expected Discharge Plan and Services Expected Discharge Plan: [place] In-house Referral: Clinical Social Work   Post Acute Care Choice: [place] Living arrangements for the past 2 months: Single Family Home                                       Social Determinants of Health (SDOH) Interventions    Readmission Risk Interventions No flowsheet data found.

## 2021-01-01 NOTE — TOC Progression Note (Signed)
Transition of Care [facility]) - Progression Note    Patient Details  Name: KLEIN WILLCOX MRN: [id number] Date of Birth: 03-09-1951  Transition of Care [facility]) CM/SW [place], LCSW Phone Number: 01/01/2021, 3:11 PM  Clinical Narrative:   Insurance auth approved for [facility] SNF rehab. Spoke to [facility]my with [facility] who stated they will have a bed for patient tomorrow, TOC to call patient tomorrow morning for room # and # for report.  Patient needs an updated COVID test. Updated MD, RN, and TOC handoff.     Expected Discharge Plan: [place] Barriers to Discharge: Continued Medical Work up  Expected Discharge Plan and Services Expected Discharge Plan: [place] In-house Referral: Clinical Social Work   Post Acute Care Choice: [place] Living arrangements for the past 2 months: Single Family Home                                       Social Determinants of Health (SDOH) Interventions    Readmission Risk Interventions No flowsheet data found.

## 2021-01-01 NOTE — Plan of Care (Signed)
  Problem: Fluid Volume: Goal: Hemodynamic stability will improve Outcome: Progressing   Problem: Clinical Measurements: Goal: Diagnostic test results will improve Outcome: Progressing Goal: Signs and symptoms of infection will decrease Outcome: Progressing   Problem: Respiratory: Goal: Ability to maintain adequate ventilation will improve Outcome: Progressing   Problem: Education: Goal: Knowledge of General Education information will improve Description: Including pain rating scale, medication(s)/side effects and non-pharmacologic comfort measures Outcome: Progressing   Problem: Health Behavior/Discharge Planning: Goal: Ability to manage health-related needs will improve Outcome: Not Progressing   Problem: Clinical Measurements: Goal: Will remain free from infection Outcome: Progressing Goal: Respiratory complications will improve Outcome: Progressing Goal: Cardiovascular complication will be avoided Outcome: Progressing   Problem: Activity: Goal: Risk for activity intolerance will decrease Outcome: Progressing   Problem: Nutrition: Goal: Adequate nutrition will be maintained Outcome: Not Progressing   Problem: Coping: Goal: Level of anxiety will decrease Outcome: Progressing   Problem: Elimination: Goal: Will not experience complications related to bowel motility Outcome: Progressing   Problem: Pain Managment: Goal: General experience of comfort will improve Outcome: Progressing   Problem: Safety: Goal: Ability to remain free from injury will improve Outcome: Progressing   Problem: Skin Integrity: Goal: Risk for impaired skin integrity will decrease Outcome: Progressing

## 2021-01-02 LAB — CBC
HCT: 28.2 % — ABNORMAL LOW (ref 39.0–52.0)
Hemoglobin: 9.4 g/dL — ABNORMAL LOW (ref 13.0–17.0)
MCH: 31.9 pg (ref 26.0–34.0)
MCHC: 33.3 g/dL (ref 30.0–36.0)
MCV: 95.6 fL (ref 80.0–100.0)
Platelets: 465 10*3/uL — ABNORMAL HIGH (ref 150–400)
RBC: 2.95 MIL/uL — ABNORMAL LOW (ref 4.22–5.81)
RDW: 13.4 % (ref 11.5–15.5)
WBC: 8.7 10*3/uL (ref 4.0–10.5)
nRBC: 0 % (ref 0.0–0.2)

## 2021-01-02 LAB — RESP PANEL BY RT-PCR (FLU A&B, COVID) ARPGX2
Influenza A by PCR: NEGATIVE
Influenza B by PCR: NEGATIVE
SARS Coronavirus 2 by RT PCR: NEGATIVE

## 2021-01-02 MED ORDER — DRONABINOL 5 MG PO CAPS: 5.0000 mg | ORAL_CAPSULE | Freq: Two times a day (BID) | ORAL | Status: AC

## 2021-01-02 MED ORDER — FOLIC ACID 1 MG PO TABS: 1.0000 mg | ORAL_TABLET | Freq: Every day | ORAL | Status: AC

## 2021-01-02 MED ORDER — FLUOXETINE HCL 10 MG PO CAPS: 10.0000 mg | ORAL_CAPSULE | Freq: Every day | ORAL | 3 refills | Status: AC

## 2021-01-02 MED ORDER — OXYCODONE-ACETAMINOPHEN 5-325 MG PO TABS: 1.0000 | ORAL_TABLET | Freq: Four times a day (QID) | ORAL | 0 refills | Status: AC | PRN

## 2021-01-02 MED ORDER — GUAIFENESIN ER 600 MG PO TB12: 1200.0000 mg | ORAL_TABLET | Freq: Two times a day (BID) | ORAL | Status: AC

## 2021-01-02 NOTE — Discharge Summary (Signed)
Physician Discharge Summary  Scott Jordan YTK:[id number] DOB: 1950-10-28 DOA: 12/17/2020  PCP: Earlie Server, MD  Admit date: 12/17/2020 Discharge date: 01/02/2021  Admitted From: Home  Disposition:  SNF   Recommendations for Outpatient Follow-up:  Please consult Palliative Care to follow at SNF  Follow up with Oncology, Dr. Tasia Catchings in 1-2 weeks Follow up with Interventional Radiology as directed by Dr. Katrinka Blazing office Follow up with Neurology [facility] Neurology) in 4-6 weeks      Home Health: N/A  Equipment/Devices: TBD at SNF  Discharge Condition: Poor, declining  CODE STATUS: DNR Diet recommendation: Liberal  Brief/Interim Summary: Scott Jordan is a 70 y.o. M with stage IV small cell lung cancer on chemo, A. fib not on anticoagulation, chronic hep C, HTN, history of alcohol use and polysubstance abuse who presented with nausea and vomiting.   In the ER, he was confused.  WBC 200, platelets 7K, RR 43.  CT abdomen showed distended gallbladder with stones.  Admitted and started on antibiotics.    PRINCIPAL HOSPITAL DIAGNOSIS: Severe sepsis due to Klebsiella pneumoniae bacteremia due to cholecystitis       Discharge Diagnoses:   Severe sepsis due to Klebsiella pneumoniae bacteremia due to cholecystitis Patient with tachypnea, leukopenia and confusion at admission, started on antibiotics.  Source found to be gallbladder.  Underwent percutaneous drain plcaement on 9/11.    Completed 9 days Flagyl and 10 days IV Rocephin per ID and sepsis physiology resolved.     Stage IV small cell lung cancer with metastasis to bone Chemotherapy related pancytopenia Platelets and neutrophils severely reduced at admission.  Thrombocytopenia and leukopenia resolved with treatment of sepsis.   The patient desires further chemotherapeutic treatments.  Because of his malnutrition, new CVAs, he is too weak at present to continue therapy.  If he were to strengthen, improve his appetite/oral intake, and  start treatment for depressed mood, Oncology feel he would be able to resume life-prolonging treatments.          New bilateral embolic CVA Developed new onset right arm weakness during this hospitalization on 9/11 and MRI brain was performed that showed multifocal acute embolic appearing strokes in the bilateral cerebral hemispheres.  Evaluated by neurology, final recommendations were:   Continue Eliquis.  No TEE given concern for aspiration.  No statin given LDL 37.  Outpatient neurology follow-up.   Paroxysmal atrial fibrillation with RVR Continue Eliquis and diltiazem.   Left pleural effusion Underwent thoracentesis on 9/16, the symptoms resolved.   Acute kidney injury Resolved  Polysubstance use disorder  Hyponatremia Resolved  Hypokalemia Hypomagnesemia Resolved   Urinary retention Transient, foley removed.   Severe protein calorie malnutrition Started on dronabinol and mirtazapine.  Depression Started on Prozac   Bilateral deep tissue injuries to the posterior head, present on arrival                   Discharge Instructions  Discharge Instructions     Ambulatory referral to Neurology   Complete by: As directed    An appointment is requested in approximately: 6 wks   Diet general   Complete by: As directed    Increase activity slowly   Complete by: As directed    No wound care   Complete by: As directed       Allergies as of 01/02/2021   No Known Allergies      Medication List     STOP taking these medications    lidocaine-prilocaine cream Commonly known as: EMLA  magnesium oxide 400 MG tablet Commonly known as: MAG-OX   Mavyret 100-40 MG Tabs Generic drug: Glecaprevir-Pibrentasvir   naloxone 4 MG/0.1ML Liqd nasal spray kit Commonly known as: NARCAN   ondansetron 8 MG tablet Commonly known as: Zofran   potassium chloride SA 20 MEQ tablet Commonly known as: KLOR-CON       TAKE these medications    diltiazem 240  MG 24 hr capsule Commonly known as: TIAZAC Take 1 capsule by mouth daily.   dronabinol 5 MG capsule Commonly known as: MARINOL Take 1 capsule (5 mg total) by mouth 2 (two) times daily before lunch and supper.   Eliquis 5 MG Tabs tablet Generic drug: apixaban Take 5 mg by mouth 2 (two) times daily.   feeding supplement Liqd Take 237 mLs by mouth 3 (three) times daily between meals.   FLUoxetine 10 MG capsule Commonly known as: PROZAC Take 1 capsule (10 mg total) by mouth daily.   folic acid 1 MG tablet Commonly known as: FOLVITE Take 1 tablet (1 mg total) by mouth daily.   guaiFENesin 600 MG 12 hr tablet Commonly known as: MUCINEX Take 2 tablets (1,200 mg total) by mouth 2 (two) times daily.   mirtazapine 15 MG tablet Commonly known as: REMERON Take by mouth.   oxyCODONE-acetaminophen 5-325 MG tablet Commonly known as: PERCOCET/ROXICET Take 1 tablet by mouth every 6 (six) hours as needed for moderate pain or severe pain.   prochlorperazine 10 MG tablet Commonly known as: COMPAZINE Take 1 tablet (10 mg total) by mouth every 6 (six) hours as needed for nausea or vomiting.        Follow-up Information     Criselda Peaches, MD Follow up.   Specialties: Interventional Radiology, Radiology Why: Our clinic will call the patient. Contact information: [place] [address] [phone]         Earlie Server, MD. Schedule an appointment as soon as possible for a visit in 1 week(s).   Specialty: Oncology Contact information: [place] [address] [phone]         [facility]. Schedule an appointment as soon as possible for a visit in 1 month(s).   Specialty: Neurology Contact information: [address] [place] [phone]               No Known Allergies  Consultations: Neurology Interventional radiology Oncology Palliative  care   Procedures/Studies: CT ABDOMEN PELVIS WO CONTRAST  Result Date: 12/17/2020 CLINICAL DATA:  Nausea and vomiting EXAM: CT ABDOMEN AND PELVIS WITHOUT CONTRAST TECHNIQUE: Multidetector CT imaging of the abdomen and pelvis was performed following the standard protocol without IV contrast. COMPARISON:  CT 06/23/2020, PET CT 11/19/2020 FINDINGS: Lower chest: Lung bases demonstrate emphysema. Subpleural bandlike airspace disease at the left lower lobe redemonstrated. Incompletely visualized subpleural peripheral left upper lobe nodule measuring 11 mm, series 5, image 1. Cardiomegaly with trace pericardial effusion. Coronary vascular calcification. Hepatobiliary: Extensive motion degradation. No focal hepatic abnormality. Gallstones. Gallbladder slightly distended. Possible wall thickening or pericholecystic fluid though limited by motion Pancreas: Unremarkable. No pancreatic ductal dilatation or surrounding inflammatory changes. Spleen: Normal in size without focal abnormality. Adrenals/Urinary Tract: Thickened appearance of adrenal glands. No hydronephrosis. The bladder is unremarkable Stomach/Bowel: Stomach nonenlarged. No dilated small bowel. Negative appendix. No acute bowel wall thickening Vascular/Lymphatic: Advanced aortic atherosclerosis. No aneurysm. Slightly increased multiple small retroperitoneal lymph nodes. Reproductive: Prostate is unremarkable. Other: Negative for free air  or free fluid Musculoskeletal: No acute osseous abnormality. IMPRESSION: 1. Degraded by motion. 2. Gallbladder appears slightly distended and there are gallstones. Possible gallbladder wall thickening or pericholecystic fluid though limited by motion, consider correlation with ultrasound 3. Cardiomegaly with trace pericardial effusion. 4. Similar subpleural bandlike density at left lower lobe. Incompletely visualized peripheral left upper lobe lung nodule characterized on recent PET CT Electronically Signed   By: Donavan Foil  M.D.   On: 12/17/2020 16:50   CT ANGIO HEAD NECK W WO CM  Result Date: 12/21/2020 CLINICAL DATA:  Neuro deficit, acute, stroke suspected. Neutropenic sepsis. EXAM: CT ANGIOGRAPHY HEAD AND NECK TECHNIQUE: Multidetector CT imaging of the head and neck was performed using the standard protocol during bolus administration of intravenous contrast. Multiplanar CT image reconstructions and MIPs were obtained to evaluate the vascular anatomy. Carotid stenosis measurements (when applicable) are obtained utilizing NASCET criteria, using the distal internal carotid diameter as the denominator. CONTRAST:  29m OMNIPAQUE IOHEXOL 350 MG/ML SOLN COMPARISON:  MRI of the brain December 20, 2020. FINDINGS: CT HEAD FINDINGS Brain: Confluent hypodensity of the white matter of the cerebral hemispheres, nonspecific, most likely related to chronic small vessel ischemia. Foci of acute infarct are more conspicuous on recent MRI of the brain. No new large territorial infarct. No hemorrhage, hydrocephalus, mass or midline shift. Vascular: No hyperdense vessel or unexpected calcification. Skull: Normal. Negative for fracture or focal lesion. Sinuses: Increased thickness of the walls of the bilateral maxillary sinuses with bubbly secretion on the left. Findings are suggestive of chronic sinusitis. Right mastoid effusion. Orbits: No acute finding. Review of the MIP images confirms the above findings CTA NECK FINDINGS Aortic arch: Standard branching. Imaged portion shows no evidence of aneurysm or dissection. Mild atherosclerotic changes of the aortic arch without significant stenosis of the major arch vessel origins. Right carotid system: Calcified atherosclerotic disease of the right carotid bifurcation without hemodynamically significant stenosis. Left carotid system: Atherosclerotic disease of the left carotid bifurcation with mixed density plaques without hemodynamically significant stenosis. Vertebral arteries: Calcified  atherosclerotic plaque at the origin of the right vertebral artery without hemodynamically significant stenosis. Mild atherosclerotic changes at the origin of the left vertebral artery without hemodynamically significant stenosis. Cervical vertebral arteries otherwise have normal caliber Skeleton: Degenerative changes of the cervical spine with fusion of the facet joints at C2-3 on the left. No acute findings. Other neck: Negative. Upper chest: Left-sided pleural effusion and atelectasis. Mediastinal lymphadenopathy. Review of the MIP images confirms the above findings CTA HEAD FINDINGS Anterior circulation: Calcified atherosclerotic plaques in the bilateral carotid siphons. No significant stenosis, proximal occlusion, aneurysm, or vascular malformation. Posterior circulation: No significant stenosis, proximal occlusion, aneurysm, or vascular malformation. Hypoplastic/aplastic right P1/PCA segment with right fetal PCA. Venous sinuses: As permitted by contrast timing, patent. Anatomic variants: Right fetal PCA. Review of the MIP images confirms the above findings IMPRESSION: 1. Advanced chronic microvascular ischemic changes of the white matter. 2. Atherosclerotic disease of the bilateral carotid bifurcation without hemodynamically significant stenosis. 3. Mild atherosclerotic changes at the origin of the bilateral vertebral arteries without hemodynamically significant stenosis. 4. Mild intracranial atherosclerotic disease without hemodynamically significant stenosis. Electronically Signed   By: KPedro EarlsM.D.   On: 12/21/2020 15:32   DG Chest 1 View  Result Date: 12/25/2020 CLINICAL DATA:  Thoracentesis.  Lung cancer EXAM: CHEST  1 VIEW COMPARISON:  Radiograph 12/25/2020 FINDINGS: Interval reduction in LEFT pleural fluid following thoracentesis. No pneumothorax appreciated. A small basilar effusion remains. RIGHT  lung clear. Port in the anterior chest wall with tip in distal SVC. IMPRESSION:  1. No pneumothorax appreciated following LEFT thoracentesis. 2. Considerable reduction in LEFT pleural fluid. Electronically Signed   By: Suzy Bouchard M.D.   On: 12/25/2020 17:28   DG Chest 2 View  Result Date: 12/27/2020 CLINICAL DATA:  Status post thoracentesis 2 days prior. EXAM: CHEST - 2 VIEW COMPARISON:  Radiograph same day (12/27/2020 at 830 hours FINDINGS: Persistent near complete opacification of the LEFT hemithorax. Small amount of aerated lung remains at LEFT lung apex. Port in place. RIGHT lung clear. IMPRESSION: No interval change in near complete opacification LEFT hemithorax. Electronically Signed   By: Suzy Bouchard M.D.   On: 12/27/2020 18:17   CT HEAD WO CONTRAST (5MM)  Result Date: 12/17/2020 CLINICAL DATA:  Nausea vomiting EXAM: CT HEAD WITHOUT CONTRAST TECHNIQUE: Contiguous axial images were obtained from the base of the skull through the vertex without intravenous contrast. COMPARISON:  CT brain 06/24/2020, MRI 03/10/2020, head CT 11/22/2019 FINDINGS: Brain: No acute territorial infarction, hemorrhage or intracranial mass. Extensive white matter disease, progressed from more remote exams from 2021. Stable ventricle size. Chronic lacunar infarcts within the left white matter. Vascular: No hyperdense vessel.  Carotid vascular calcification Skull: Normal. Negative for fracture or focal lesion. Sinuses/Orbits: Mucosal thickening the sinuses. Chronic appearing nasal deformity Other: None IMPRESSION: 1. No definite CT evidence for acute intracranial abnormality. 2. Atrophy. Extensive white matter disease, grossly stable as compared with recent head CT from March, appears progressive when compared to exams from 2021 and prior Electronically Signed   By: Donavan Foil M.D.   On: 12/17/2020 16:39   MR BRAIN W WO CONTRAST  Addendum Date: 12/21/2020   ADDENDUM REPORT: 12/21/2020 14:49 ADDENDUM: Patient returns for repeat postcontrast imaging. There is no abnormal enhancement. No evidence  of intracranial metastatic disease. Electronically Signed   By: Macy Mis M.D.   On: 12/21/2020 14:49   Result Date: 12/21/2020 CLINICAL DATA:  Neuro deficit, acute, stroke suspected Small cell lung cancer, monitor EXAM: MRI HEAD WITHOUT AND WITH CONTRAST TECHNIQUE: Multiplanar, multiecho pulse sequences of the brain and surrounding structures were obtained without and with intravenous contrast. CONTRAST:  7.52m GADAVIST GADOBUTROL 1 MMOL/ML IV SOLN COMPARISON:  CT head 12/17/2020. FINDINGS: Mildly motion limited exam.  Within this limitation: Brain: Multiple small areas of restricted diffusion in the bilateral frontal and occipital, and left parietal matter, left perirolandic cortex, left thalamocapsular region, and left cerebellum. Advanced confluent T2 hyperintensity within the white matter, nonspecific but compatible with chronic microvascular ischemic disease and progressed since 2021. Small remote infarct in the left corona radiata. No hydrocephalus, obvious mass lesion, midline shift, acute hemorrhage, or extra-axial fluid collection. Punctate foci of susceptibility artifact in the left cerebellum and left frontal white matter, nonspecific but potentially related to prior microhemorrhage. Contrast was administered; however, there is no evidence of enhancement (for example, the nasal mucosa/turbinates are nonenhancing). This precludes evaluation for enhancing lesions. Vascular: Major arterial flow voids are maintained at the skull base. Skull and upper cervical spine: Normal marrow signal. Sinuses/Orbits: Mild paranasal sinus disease. Other: Moderate right and small left mastoid effusions. IMPRESSION: 1. Multiple small areas of restricted diffusion in the bilateral frontal and occipital and left parietal white matter, left perirolandic cortex, left thalamocapsular region, and left cerebellum, which are concerning for acute infarcts. Given involvement of multiple vascular territories, consider a central  embolic etiology. 2. Contrast was administered; however, there is no evidence of enhancement, unclear  why given reported uneventful injection per the technologist. This precludes evaluation for enhancing lesions. Given the patient's known small cell lung cancer diagnosis, recommend repeat postcontrast imaging to exclude metastatic disease. I've asked the MRI technologist to not bill the patient for the repeat post-contrast imaging. 3. Advanced chronic microvascular ischemic disease, progressed since 2021. 4. Moderate right and small left mastoid effusions. Electronically Signed: By: Margaretha Sheffield M.D. On: 12/20/2020 14:29   IR Perc Cholecystostomy  Result Date: 12/20/2020 INDICATION: 70 year old male with small cell lung cancer and chemotherapy induced pancytopenia presenting with neutropenic sepsis. Blood cultures grew out Klebsiella and imaging findings are concerning for acute cholecystitis. He is too sick and frail to be considered an operative candidate. Following transfusion of platelets, his platelets are now at an acceptable level to proceed with percutaneous cholecystostomy tube placement. EXAM: CHOLECYSTOSTOMY MEDICATIONS: In patient currently receiving intravenous antibiotic therapy. No additional antibiotic prophylaxis administered. ANESTHESIA/SEDATION: Moderate (conscious) sedation was employed during this procedure. A total of Versed 1 mg and Fentanyl 50 mcg and 0.5 mg Dilaudid administered intravenously. Moderate Sedation Time: 13 minutes. The patient's level of consciousness and vital signs were monitored continuously by radiology nursing throughout the procedure under my direct supervision. FLUOROSCOPY TIME:  Fluoroscopy Time: 0 minutes 30 seconds (4.3 mGy). COMPLICATIONS: None immediate. PROCEDURE: Informed written consent was obtained from the patient after a thorough discussion of the procedural risks, benefits and alternatives. All questions were addressed. Maximal Sterile Barrier  Technique was utilized including caps, mask, sterile gowns, sterile gloves, sterile drape, hand hygiene and skin antiseptic. A timeout was performed prior to the initiation of the procedure. The right upper quadrant was interrogated with ultrasound. The distended gallbladder with a thickened wall was successfully identified. There is significant excursion of the liver with respiration due to use of abdominal muscles. A suitable skin entry site was selected. Local anesthesia was attained by infiltration with 1% lidocaine. A small dermatotomy was made. Under real-time ultrasound guidance, the movement of the liver was timed with respirations and in a single pass, a 21 gauge Accustick needle was advanced through the liver and into the gallbladder lumen along a short transhepatic course. A 0.018 wire was quickly advanced into the gallbladder lumen and the needle was exchanged for the transitional dilator. The transitional dilator was advanced into the gallbladder lumen. The wire was removed. Contrast injection was performed under fluoroscopy confirming opacification of the gallbladder lumen. Multiple filling defects consistent with cholelithiasis. The cystic duct is occluded. A 0.035 wire was then coiled within the gallbladder lumen. The transitional dilator was removed. The transhepatic tract was dilated to 10 [place] with a stiff dilator. A Cook 10.2 [place] all-purpose drainage catheter was then advanced over the wire and formed in the gallbladder lumen. Images were obtained and stored for the medical record. The catheter was gently flushed and connected to gravity bag drainage before being secured to the skin with 0 silk suture. IMPRESSION: Successful placement of 10 French transhepatic percutaneous cholecystostomy tube for an indication of calculus cholecystitis. PLAN: Maintain tube to gravity bag drainage. Patient will likely need home health to assist with care and cleaning of the tube site. Return to  interventional radiology approximately every 8 weeks for cholecystostomy tube check and exchange. If patient's clinical status improves in the future, elective cholecystectomy would be optimal. If patient remains a poor operative candidate, long-term cholecystostomy tube maintenance will likely be required. Electronically Signed   By: Jacqulynn Cadet M.D.   On: 12/20/2020 13:50   DG  Chest Port 1 View  Result Date: 12/27/2020 CLINICAL DATA:  Shortness of breath with atelectasis common thoracentesis 2 days ago EXAM: PORTABLE CHEST 1 VIEW COMPARISON:  Chest radiograph 12/25/2020 FINDINGS: The left chest wall port is in stable position with the tip terminating in the region of the right atrium. The cardiomediastinal silhouette can not be assessed. The left pleural effusion has increased in size with worsening aeration of the left upper lobe. Aeration of the right lung is not significantly changed. There is no significant right effusion. There is no appreciable pneumothorax. There is no acute osseous abnormality. Enteric contrast is noted in the colon. IMPRESSION: Increased size of the large left pleural effusion with worsened aeration of the left lung. Electronically Signed   By: Valetta Mole M.D.   On: 12/27/2020 09:12   DG Chest Port 1 View  Result Date: 12/25/2020 CLINICAL DATA:  Shortness of breath EXAM: PORTABLE CHEST 1 VIEW COMPARISON:  Chest x-ray 12/17/2020, PET CT 11/19/2020 FINDINGS: Left-sided central venous port tip over right atrium. Interval complete opacification of left thorax, suspect that there is at least moderate pleural effusion. Cardiomediastinal silhouette is obscured. No pneumothorax is visualized. IMPRESSION: Interval opacification of the entire left thorax which may be due to combination of pleural effusion/atelectasis/airspace disease. Electronically Signed   By: Donavan Foil M.D.   On: 12/25/2020 15:19   DG Chest Port 1 View  Result Date: 12/17/2020 CLINICAL DATA:  Questionable  sepsis.  Weakness EXAM: PORTABLE CHEST 1 VIEW COMPARISON:  03/09/2020 FINDINGS: Left subclavian approached chest port remains in place. Stable cardiomegaly. Chronic left basilar scarring or atelectasis. Peripheral left upper lobe nodule better seen on prior CT. No new airspace consolidation. Pleural effusion or pneumothorax. IMPRESSION: Chronic left basilar scarring or atelectasis. No new or acute findings. Electronically Signed   By: Davina Poke D.O.   On: 12/17/2020 13:20   DG Swallowing Func-Speech Pathology  Result Date: 12/25/2020 Table formatting from the original result was not included. Objective Swallowing Evaluation: Type of Study: MBS-Modified Barium Swallow Study  Patient Details Name: Scott Jordan MRN: [id number] Date of Birth: 05/12/1950 Today's Date: 12/25/2020 Time: SLP Start Time (ACUTE ONLY): 75 -SLP Stop Time (ACUTE ONLY): 1300 SLP Time Calculation (min) (ACUTE ONLY): 30 min Past Medical History: Past Medical History: Diagnosis Date  A-fib ([place]) 01/10/2019  pt st this was dx by Dr. Ubaldo Glassing  Cancer [facility]) [id number]  LUNG  Complication of anesthesia   DIFFICULTY WAKING UP AFTER SURGERY- 85 YRS AGO  Hypertension   Lung cancer ([place])   Substance abuse ([place])  Past Surgical History: Past Surgical History: Procedure Laterality Date  ARM DEBRIDEMENT Left   INCISION AND DEBRIDEMENT LOWER ARM -20 YRS AGO  BACK SURGERY    IR PERC CHOLECYSTOSTOMY  12/20/2020  PORTACATH PLACEMENT Left 08/29/2019  Procedure: INSERTION PORT-A-CATH;  Surgeon: Nestor Lewandowsky, MD;  Location: [facility] ORS;  Service: General;  Laterality: Left; HPI: Pt is a 70 year old male with PMH of 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 fib not on AC, chronic hep C, chronic cancer pain, HTN, alcohol abuse, malnutrition and polysubstance who is admitted with neutropenic sepsis due to acute cholecystitis and Klebsiella bacteremia now s/p cholecystostomy on 9/11 by IR. Hospital admission complicated by acute  thromboembolic stroke with new right-sided weakness-confirmed on MRI brain on 12/20/2020.  MRI: Multiple small areas of restricted diffusion in the bilateral  frontal and occipital and left parietal white matter, left  perirolandic cortex, left thalamocapsular region,  and left  cerebellum, which are concerning for acute infarcts.  CXR at admit: Chronic left basilar scarring or atelectasis. No new or acute  findings.  Pt is followed by Palliative Care at [facility].  Subjective: pt able to communicate his wants/needs Assessment / Plan / Recommendation CHL IP CLINICAL IMPRESSIONS 12/25/2020 Clinical Impression Pt presents with adequate airway protection while consuming thin liquids, nectar thick liquids, puree and whole barium tablet with thin liquids. Pt's oral phase is unremarkable and his swallow response is timely with good airway protection. His overall medical comorbidities and respiratory decline place him at an overall increased risk of aspiration. For example, when consuming consecutive sips of thin liquids, pt became short of breath. At this time, recommend pt continue consuming dysphagia 3 diet for energy conservation with thin liquids, medicine whole with thin liquids. Pt needs to follow strict aspiration precautions such as taking single sips and rest breaks. SLP Visit Diagnosis Dysphagia, oropharyngeal phase (R13.12) Attention and concentration deficit following -- Frontal lobe and executive function deficit following -- Impact on safety and function Mild aspiration risk;Moderate aspiration risk   CHL IP TREATMENT RECOMMENDATION 12/25/2020 Treatment Recommendations No treatment recommended at this time   Prognosis 12/24/2020 Prognosis for Safe Diet Advancement Fair Barriers to Reach Goals Motivation;Time post onset;Severity of deficits;Behavior Barriers/Prognosis Comment -- CHL IP DIET RECOMMENDATION 12/25/2020 SLP Diet Recommendations Dysphagia 3 (Mech soft) solids;Thin liquid Liquid Administration via  Cup Medication Administration Whole meds with liquid Compensations Minimize environmental distractions;Slow rate;Small sips/bites Postural Changes Remain semi-upright after after feeds/meals (Comment);Seated upright at 90 degrees   CHL IP OTHER RECOMMENDATIONS 12/25/2020 Recommended Consults -- Oral Care Recommendations Oral care BID Other Recommendations --   CHL IP FOLLOW UP RECOMMENDATIONS 12/25/2020 Follow up Recommendations None   CHL IP FREQUENCY AND DURATION 12/24/2020 Speech Therapy Frequency (ACUTE ONLY) min 3x week Treatment Duration 2 weeks      CHL IP ORAL PHASE 12/25/2020 Oral Phase WFL Oral - Pudding Teaspoon -- Oral - Pudding Cup -- Oral - Honey Teaspoon -- Oral - Honey Cup -- Oral - Nectar Teaspoon -- Oral - Nectar Cup -- Oral - Nectar Straw -- Oral - Thin Teaspoon -- Oral - Thin Cup -- Oral - Thin Straw -- Oral - Puree -- Oral - Mech Soft -- Oral - Regular -- Oral - Multi-Consistency -- Oral - Pill -- Oral Phase - Comment --  CHL IP PHARYNGEAL PHASE 12/25/2020 Pharyngeal Phase WFL Pharyngeal- Pudding Teaspoon -- Pharyngeal -- Pharyngeal- Pudding Cup -- Pharyngeal -- Pharyngeal- Honey Teaspoon -- Pharyngeal -- Pharyngeal- Honey Cup -- Pharyngeal -- Pharyngeal- Nectar Teaspoon -- Pharyngeal -- Pharyngeal- Nectar Cup -- Pharyngeal -- Pharyngeal- Nectar Straw -- Pharyngeal -- Pharyngeal- Thin Teaspoon -- Pharyngeal -- Pharyngeal- Thin Cup -- Pharyngeal -- Pharyngeal- Thin Straw -- Pharyngeal -- Pharyngeal- Puree -- Pharyngeal -- Pharyngeal- Mechanical Soft -- Pharyngeal -- Pharyngeal- Regular -- Pharyngeal -- Pharyngeal- Multi-consistency -- Pharyngeal -- Pharyngeal- Pill -- Pharyngeal -- Pharyngeal Comment --  CHL IP CERVICAL ESOPHAGEAL PHASE 12/25/2020 Cervical Esophageal Phase WFL Pudding Teaspoon -- Pudding Cup -- Honey Teaspoon -- Honey Cup -- Nectar Teaspoon -- Nectar Cup -- Nectar Straw -- Thin Teaspoon -- Thin Cup -- Thin Straw -- Puree -- Mechanical Soft -- Regular -- Multi-consistency -- Pill --  Cervical Esophageal Comment -- Happi B. [name] M.S., CCC-SLP, [place] Office [phone] Stormy Fabian 12/25/2020, 2:17 PM              ECHOCARDIOGRAM COMPLETE  Result Date: 12/21/2020  ECHOCARDIOGRAM REPORT   Patient Name:   MCKAY TEGTMEYER Date of Exam: 12/21/2020 Medical Rec #:  [id number]    Height:       72.0 in Accession #:    [id number]   Weight:       137.1 lb Date of Birth:  06-18-1950     BSA:          1.815 m Patient Age:    14 years     BP:           114/80 mmHg Patient Gender: M            HR:           89 bpm. Exam Location:  [facility] Procedure: 2D Echo, Color Doppler and Cardiac Doppler Indications:     Endocarditis I38  History:         Patient has prior history of Echocardiogram examinations, most                  recent 08/19/2019. Arrythmias:Atrial Fibrillation; Risk                  Factors:Hypertension. Substance abuse.  Sonographer:     Sherrie Sport Referring Phys:  [place] Diagnosing Phys: Serafina Royals MD IMPRESSIONS  1. Left ventricular ejection fraction, by estimation, is 60 to 65%. The left ventricle has normal function. The left ventricle has no regional wall motion abnormalities. Left ventricular diastolic parameters were normal.  2. Right ventricular systolic function is normal. The right ventricular size is normal.  3. The mitral valve is normal in structure. Trivial mitral valve regurgitation.  4. The aortic valve is normal in structure. Aortic valve regurgitation is not visualized. Mild aortic valve sclerosis is present, with no evidence of aortic valve stenosis. Conclusion(s)/Recommendation(s): No evidence of valve vegetations. FINDINGS  Left Ventricle: Left ventricular ejection fraction, by estimation, is 60 to 65%. The left ventricle has normal function. The left ventricle has no regional wall motion abnormalities. The left ventricular internal cavity size was small. There is no left ventricular hypertrophy. Left ventricular  diastolic parameters were normal. Right Ventricle: The right ventricular size is normal. No increase in right ventricular wall thickness. Right ventricular systolic function is normal. Left Atrium: Left atrial size was normal in size. Right Atrium: Right atrial size was normal in size. Pericardium: There is no evidence of pericardial effusion. Mitral Valve: The mitral valve is normal in structure. Trivial mitral valve regurgitation. Tricuspid Valve: The tricuspid valve is normal in structure. Tricuspid valve regurgitation is trivial. Aortic Valve: The aortic valve is normal in structure. Aortic valve regurgitation is not visualized. Mild aortic valve sclerosis is present, with no evidence of aortic valve stenosis. Aortic valve mean gradient measures 3.0 mmHg. Aortic valve peak gradient measures 5.9 mmHg. Aortic valve area, by VTI measures 2.30 cm. Pulmonic Valve: The pulmonic valve was normal in structure. Pulmonic valve regurgitation is not visualized. Aorta: The aortic root and ascending aorta are structurally normal, with no evidence of dilitation. IAS/Shunts: No atrial level shunt detected by color flow Doppler.  LEFT VENTRICLE PLAX 2D LVIDd:         3.87 cm LVIDs:         2.30 cm LV PW:         1.00 cm LV IVS:        1.18 cm LVOT diam:     2.10 cm LV SV:         44 LV SV Index:  24 LVOT Area:     3.46 cm  RIGHT VENTRICLE RV Basal diam:  3.70 cm RV S prime:     12.40 cm/s TAPSE (M-mode): 3.7 cm LEFT ATRIUM           Index       RIGHT ATRIUM           Index LA diam:      3.50 cm 1.93 cm/m  RA Area:     33.70 cm LA Vol (A2C): 57.4 ml 31.63 ml/m RA Volume:   128.00 ml 70.53 ml/m LA Vol (A4C): 72.1 ml 39.73 ml/m  AORTIC VALVE                   PULMONIC VALVE AV Area (Vmax):    2.34 cm    PV Vmax:        0.60 m/s AV Area (Vmean):   2.32 cm    PV Peak grad:   1.4 mmHg AV Area (VTI):     2.30 cm    RVOT Peak grad: 2 mmHg AV Vmax:           121.00 cm/s AV Vmean:          86.000 cm/s AV VTI:            0.193 m  AV Peak Grad:      5.9 mmHg AV Mean Grad:      3.0 mmHg LVOT Vmax:         81.60 cm/s LVOT Vmean:        57.600 cm/s LVOT VTI:          0.128 m LVOT/AV VTI ratio: 0.66  AORTA Ao Root diam: 3.50 cm MITRAL VALVE               TRICUSPID VALVE MV Area (PHT): 4.52 cm    TR Peak grad:   24.4 mmHg MV Decel Time: 168 msec    TR Vmax:        247.00 cm/s MV E velocity: 88.30 cm/s                            SHUNTS                            Systemic VTI:  0.13 m                            Systemic Diam: 2.10 cm Serafina Royals MD Electronically signed by Serafina Royals MD Signature Date/Time: 12/21/2020/5:20:09 PM    Final    US Abdomen Limited RUQ (LIVER/GB)  Result Date: 12/18/2020 CLINICAL DATA:  Evaluate for cholecystitis. EXAM: ULTRASOUND ABDOMEN LIMITED RIGHT UPPER QUADRANT COMPARISON:  CT AP from 12/17/2020 FINDINGS: Gallbladder: The gallbladder wall is diffusely edematous measuring up to 13.1 mm. Gallstones are noted measuring up to 5.9 mm. Pericholecystic fluid. Positive sonographic Murphy's sign. Common bile duct: Diameter: 6 mm Liver: Diffusely increased parenchymal echogenicity identified. No focal liver abnormality. Portal vein is patent on color Doppler imaging with normal direction of blood flow towards the liver. Other: 1.9 cm right kidney cyst. IMPRESSION: Gallstones, gallbladder wall thickening and pericholecystic fluid with positive sonographic Murphy's sign. Findings are compatible with acute cholecystitis. Electronically Signed   By: Kerby Moors M.D.   On: 12/18/2020 14:18   US THORACENTESIS ASP PLEURAL SPACE W/IMG GUIDE  Result  Date: 12/25/2020 INDICATION: Large left pleural effusion secondary to known lung cancer. Request for diagnostic and therapeutic thoracentesis. EXAM: ULTRASOUND GUIDED LEFT THORACENTESIS MEDICATIONS: 1% lidocaine 10 mL COMPLICATIONS: None immediate. PROCEDURE: An ultrasound guided thoracentesis was thoroughly discussed with the patient and questions answered. The benefits,  risks, alternatives and complications were also discussed. The patient understands and wishes to proceed with the procedure. Written consent was obtained. Ultrasound was performed to localize and mark an adequate pocket of fluid in the left chest. The area was then prepped and draped in the normal sterile fashion. 1% Lidocaine was used for local anesthesia. Under ultrasound guidance a 6 Fr Safe-T-Centesis catheter was introduced. Thoracentesis was performed. The catheter was removed and a dressing applied. FINDINGS: A total of approximately 700 mL of hazy yellow fluid was removed. Samples were sent to the laboratory as requested by the clinical team. IMPRESSION: Successful ultrasound guided left thoracentesis yielding 700 mL of pleural fluid. Read by: Gareth Eagle, PA-C Electronically Signed   By: Michaelle Birks M.D.   On: 12/25/2020 16:44      Subjective: No complaints.  Appetite is better.  No confusion.  No fever.  Discharge Exam: Vitals:   01/01/21 1932 01/02/21 0849  BP: 103/65 128/84  Pulse: (!) 53 100  Resp: 20 12  Temp: 98.6 F (37 C) 98.5 F (36.9 C)  SpO2: 97% 99%   Vitals:   01/01/21 0812 01/01/21 1556 01/01/21 1932 01/02/21 0849  BP: 135/83 104/78 103/65 128/84  Pulse: (!) 103 95 (!) 53 100  Resp: 18 18 20 12   Temp: (!) 97.4 F (36.3 C) 98 F (36.7 C) 98.6 F (37 C) 98.5 F (36.9 C)  TempSrc: Oral Oral Oral Oral  SpO2: 97% 97% 97% 99%  Weight:      Height:        General: Pt is alert, awake, not in acute distress, appears tired and listless. Cardiovascular: RRR, nl S1-S2, no murmurs appreciated.   No LE edema.   Respiratory: Normal respiratory rate and rhythm.  CTAB without rales or wheezes. Abdominal: Abdomen soft and non-tender.  No distension or HSM.   Neuro/Psych: Strength symmetric in upper and lower extremities.  Judgment and insight appear normal.   The results of significant diagnostics from this hospitalization (including imaging, microbiology, ancillary and  laboratory) are listed below for reference.     Microbiology: Recent Results (from the past 240 hour(s))  Acid Fast Smear (AFB)     Status: None   Collection Time: 12/25/20  4:30 PM   Specimen: PATH Cytology Pleural fluid  Result Value Ref Range Status   AFB Specimen Processing Concentration  Final   Acid Fast Smear Negative  Final    Comment: (NOTE) Performed At: [facility] [place], [place] [id number] Rush Farmer MD QV:[id number]    Source (AFB) PLEURAL  Final    Comment: Performed at [facility], [address] [place], [place] [address]  Body fluid culture w Gram Stain     Status: None   Collection Time: 12/25/20  4:30 PM   Specimen: PATH Cytology Pleural fluid  Result Value Ref Range Status   Specimen Description   Final    PLEURAL Performed at [facility], [address], [place], [place] [address]    Special Requests   Final    NONE Performed at [facility], [address], [place], [place] [address]    Gram Stain   Final    RARE WBC  PRESENT,BOTH PMN AND MONONUCLEAR NO ORGANISMS SEEN    Culture   Final    NO GROWTH 3 DAYS Performed at [facility], [place] [address] [place], [place] [address]    Report Status 12/29/2020 FINAL  Final  Body fluid culture w Gram Stain     Status: None   Collection Time: 12/25/20  4:49 PM   Specimen: BILE; Body Fluid  Result Value Ref Range Status   Specimen Description   Final    BILE Performed at [facility], [address], [place], [place] [address]    Special Requests   Final    NONE Performed at [facility], [address] [place], [place] [address]    Gram Stain   Final    RARE WBC PRESENT, PREDOMINANTLY MONONUCLEAR NO ORGANISMS SEEN    Culture   Final    NO GROWTH 3 DAYS Performed at [facility], [place] [address], [place], [place] [address]    Report Status 12/29/2020 FINAL  Final  Resp Panel by RT-PCR (Flu A&B, Covid)  Nasopharyngeal Swab     Status: None   Collection Time: 01/02/21  7:46 AM   Specimen: Nasopharyngeal Swab; Nasopharyngeal(NP) swabs in vial transport medium  Result Value Ref Range Status   SARS Coronavirus 2 by RT PCR NEGATIVE NEGATIVE Final    Comment: (NOTE) SARS-CoV-2 target nucleic acids are NOT DETECTED.  The SARS-CoV-2 RNA is generally detectable in upper respiratory specimens during the acute phase of infection. The lowest concentration of SARS-CoV-2 viral copies this assay can detect is 138 copies/mL. A negative result does not preclude SARS-Cov-2 infection and should not be used as the sole basis for treatment or other patient management decisions. A negative result may occur with  improper specimen collection/handling, submission of specimen other than nasopharyngeal swab, presence of viral mutation(s) within the areas targeted by this assay, and inadequate number of viral copies(<138 copies/mL). A negative result must be combined with clinical observations, patient history, and epidemiological information. The expected result is Negative.  Fact Sheet for Patients:  EntrepreneurPulse.com.au  Fact Sheet for Healthcare Providers:  IncredibleEmployment.be  This test is no t yet approved or cleared by the [place] FDA and  has been authorized for detection and/or diagnosis of SARS-CoV-2 by FDA under an Emergency Use Authorization (EUA). This EUA will remain  in effect (meaning this test can be used) for the duration of the COVID-19 declaration under Section 564(b)(1) of the Act, 21 U.S.C.section 360bbb-3(b)(1), unless the authorization is terminated  or revoked sooner.       Influenza A by PCR NEGATIVE NEGATIVE Final   Influenza B by PCR NEGATIVE NEGATIVE Final    Comment: (NOTE) The Xpert Xpress SARS-CoV-2/FLU/RSV plus assay is intended as an aid in the diagnosis of influenza from Nasopharyngeal swab specimens and should not be  used as a sole basis for treatment. Nasal washings and aspirates are unacceptable for Xpert Xpress SARS-CoV-2/FLU/RSV testing.  Fact Sheet for Patients: EntrepreneurPulse.com.au  Fact Sheet for Healthcare Providers: IncredibleEmployment.be  This test is not yet approved or cleared by the [place] FDA and has been authorized for detection and/or diagnosis of SARS-CoV-2 by FDA under an Emergency Use Authorization (EUA). This EUA will remain in effect (meaning this test can be used) for the duration of the COVID-19 declaration under Section 564(b)(1) of the Act, 21 U.S.C. section 360bbb-3(b)(1), unless the authorization is terminated or revoked.  Performed at [facility], [place],  [place], [place] [address]      Labs: BNP (last 3 results) Recent Labs    12/27/20 1540  BNP 277.4*   Basic Metabolic Panel: Recent Labs  Lab 12/27/20 0615 12/28/20 0515  NA 137 136  K 3.0* 3.8  CL 108 108  CO2 25 22  GLUCOSE 98 93  BUN 12 10  CREATININE 0.57* 0.62  CALCIUM 7.3* 7.6*  MG 1.7 1.8  PHOS 2.9 2.9   Liver Function Tests: Recent Labs  Lab 12/27/20 0615 12/28/20 0515  ALBUMIN 1.9* 1.9*   No results for input(s): LIPASE, AMYLASE in the last 168 hours. No results for input(s): AMMONIA in the last 168 hours. CBC: Recent Labs  Lab 12/29/20 0925 12/30/20 0215 12/31/20 0534 01/01/21 0520 01/02/21 0457  WBC 14.7* 14.7* 12.4* 11.7* 8.7  HGB 8.4* 8.1* 7.8* 8.0* 9.4*  HCT 24.0* 23.6* 22.8* 23.0* 28.2*  MCV 96.4 97.9 96.2 96.2 95.6  PLT 508* 539* 549* 536* 465*   Cardiac Enzymes: No results for input(s): CKTOTAL, CKMB, CKMBINDEX, TROPONINI in the last 168 hours. BNP: Invalid input(s): POCBNP CBG: No results for input(s): GLUCAP in the last 168 hours. D-Dimer No results for input(s): DDIMER in the last 72 hours. Hgb A1c No results for input(s): HGBA1C in the last 72 hours. Lipid Profile No results for  input(s): CHOL, HDL, LDLCALC, TRIG, CHOLHDL, LDLDIRECT in the last 72 hours. Thyroid function studies No results for input(s): TSH, T4TOTAL, T3FREE, THYROIDAB in the last 72 hours.  Invalid input(s): FREET3 Anemia work up No results for input(s): VITAMINB12, FOLATE, FERRITIN, TIBC, IRON, RETICCTPCT in the last 72 hours. Urinalysis    Component Value Date/Time   COLORURINE AMBER (A) 12/17/2020 1840   APPEARANCEUR TURBID (A) 12/17/2020 1840   LABSPEC 1.030 12/17/2020 1840   PHURINE 5.0 12/17/2020 1840   GLUCOSEU 100 (A) 12/17/2020 1840   HGBUR LARGE (A) 12/17/2020 1840   BILIRUBINUR MODERATE (A) 12/17/2020 1840   KETONESUR TRACE (A) 12/17/2020 1840   PROTEINUR 100 (A) 12/17/2020 1840   NITRITE POSITIVE (A) 12/17/2020 1840   LEUKOCYTESUR NEGATIVE 12/17/2020 1840   Sepsis Labs Invalid input(s): PROCALCITONIN,  WBC,  LACTICIDVEN Microbiology Recent Results (from the past 240 hour(s))  Acid Fast Smear (AFB)     Status: None   Collection Time: 12/25/20  4:30 PM   Specimen: PATH Cytology Pleural fluid  Result Value Ref Range Status   AFB Specimen Processing Concentration  Final   Acid Fast Smear Negative  Final    Comment: (NOTE) Performed At: [facility] [place], [place] [id number] Rush Farmer MD [id number]    Source (AFB) PLEURAL  Final    Comment: Performed at [facility], [address] [place], [place] [address]  Body fluid culture w Gram Stain     Status: None   Collection Time: 12/25/20  4:30 PM   Specimen: PATH Cytology Pleural fluid  Result Value Ref Range Status   Specimen Description   Final    PLEURAL Performed at [facility], [address] [place], [place] [address]    Special Requests   Final    NONE Performed at [facility], [address] [place], [place] [address]    Gram Stain   Final    RARE WBC PRESENT,BOTH PMN AND MONONUCLEAR NO ORGANISMS SEEN    Culture   Final    NO GROWTH 3  DAYS Performed at [facility], [place] [address] [place], [place] [address]  Report Status 12/29/2020 FINAL  Final  Body fluid culture w Gram Stain     Status: None   Collection Time: 12/25/20  4:49 PM   Specimen: BILE; Body Fluid  Result Value Ref Range Status   Specimen Description   Final    BILE Performed at [facility], [address], [place], [place] [address]    Special Requests   Final    NONE Performed at [facility], [address] [place], [place] [address]    Gram Stain   Final    RARE WBC PRESENT, PREDOMINANTLY MONONUCLEAR NO ORGANISMS SEEN    Culture   Final    NO GROWTH 3 DAYS Performed at [facility], [place] [address] [place], [place] [address]    Report Status 12/29/2020 FINAL  Final  Resp Panel by RT-PCR (Flu A&B, Covid) Nasopharyngeal Swab     Status: None   Collection Time: 01/02/21  7:46 AM   Specimen: Nasopharyngeal Swab; Nasopharyngeal(NP) swabs in vial transport medium  Result Value Ref Range Status   SARS Coronavirus 2 by RT PCR NEGATIVE NEGATIVE Final    Comment: (NOTE) SARS-CoV-2 target nucleic acids are NOT DETECTED.  The SARS-CoV-2 RNA is generally detectable in upper respiratory specimens during the acute phase of infection. The lowest concentration of SARS-CoV-2 viral copies this assay can detect is 138 copies/mL. A negative result does not preclude SARS-Cov-2 infection and should not be used as the sole basis for treatment or other patient management decisions. A negative result may occur with  improper specimen collection/handling, submission of specimen other than nasopharyngeal swab, presence of viral mutation(s) within the areas targeted by this assay, and inadequate number of viral copies(<138 copies/mL). A negative result must be combined with clinical observations, patient history, and epidemiological information. The expected result is Negative.  Fact Sheet for Patients:   EntrepreneurPulse.com.au  Fact Sheet for Healthcare Providers:  IncredibleEmployment.be  This test is no t yet approved or cleared by the [place] FDA and  has been authorized for detection and/or diagnosis of SARS-CoV-2 by FDA under an Emergency Use Authorization (EUA). This EUA will remain  in effect (meaning this test can be used) for the duration of the COVID-19 declaration under Section 564(b)(1) of the Act, 21 U.S.C.section 360bbb-3(b)(1), unless the authorization is terminated  or revoked sooner.       Influenza A by PCR NEGATIVE NEGATIVE Final   Influenza B by PCR NEGATIVE NEGATIVE Final    Comment: (NOTE) The Xpert Xpress SARS-CoV-2/FLU/RSV plus assay is intended as an aid in the diagnosis of influenza from Nasopharyngeal swab specimens and should not be used as a sole basis for treatment. Nasal washings and aspirates are unacceptable for Xpert Xpress SARS-CoV-2/FLU/RSV testing.  Fact Sheet for Patients: EntrepreneurPulse.com.au  Fact Sheet for Healthcare Providers: IncredibleEmployment.be  This test is not yet approved or cleared by the [place] FDA and has been authorized for detection and/or diagnosis of SARS-CoV-2 by FDA under an Emergency Use Authorization (EUA). This EUA will remain in effect (meaning this test can be used) for the duration of the COVID-19 declaration under Section 564(b)(1) of the Act, 21 U.S.C. section 360bbb-3(b)(1), unless the authorization is terminated or revoked.  Performed at [facility], [address] [place], [place] [address]      Time coordinating discharge: 25 minutes The [place] controlled substances registry was reviewed for this patient      30 Day Unplanned Readmission Risk Score    Flowsheet  [place] ED to Hosp-Admission (Current) from 12/17/2020 in [place]  30 Day Unplanned Readmission Risk  Score (%) 27.59 Filed at 01/02/2021 0800       This score is the patient's risk of an unplanned readmission within 30 days of being discharged (0 -100%). The score is based on dignosis, age, lab data, medications, orders, and past utilization.   Low:  0-14.9   Medium: 15-21.9   High: 22-29.9   Extreme: 30 and above            SIGNED:   Edwin Dada, MD  [facility] 01/02/2021, 10:35 AM

## 2021-01-02 NOTE — Progress Notes (Signed)
Pulmonary Medicine          Date: 01/02/2021,   MRN# [id number] Scott Jordan 1950-09-15     AdmissionWeight: 61.7 kg                 CurrentWeight: 69.8 kg  Referring physician: Dr Cyndia Skeeters    CHIEF COMPLAINT:   Acute on chronic hypoxemic respiratory failure   HISTORY OF PRESENT ILLNESS   This is a pleasant male with hx of lung cancer, stage 4 small cell ca, substance abuse, chronic HCV, bony metastasis who is noted to have abnormal chest imaging with complete white out of left lung.    He appears weak and is severely dyspenic during my evaluation bu able to speak slowly.  PCCM consultation for evaluation and management. Pictorial documentation of tis thoracic imaging is included below showing large pleural effusion with associated complete left atelcetasis.   12/27/20- patient feels improved, s/p throacentesis 700 cc removed,  repeat CXR this am with improvement on left.   Atelectasis is still present and he need to have aggressive BPH, will order metaneb with albuterol today to be done with RT twice daily   12/28/20- patient was seen and evaluated at bedside with family present we discussed potential tunneled pleural catheter and patient is imrproved.    12/29/20- patient is stable on 2L/min [place] speaking on phone in full sentences.  Palliative evaluation for possible comfort care. He had thoracentesis but no malignant cells found on cytology. He is feeling better and working with PT/OT.   12/30/20- patient is improved slightly, he was able to participate with PT today and at maximal exertion had very slight desaturation only to 87%.  He asked me to leave after brief examination due to wanting sleep post exercise.  Overall he is with poor prognosis but did reach short term interval improvement.   01/02/21-  have been peripherally following this man, he is stable from pulmonary perspective and is in no distress.  He does have decreased breath sounds on left with likely slow  re-accumulation of fluid but overall this is not of major importance with his poor prognosis due to cancer.  Will sign off but can see patient any time if needed.   PAST MEDICAL HISTORY   Past Medical History:  Diagnosis Date  . A-fib ([place]) 01/10/2019   pt st this was dx by Dr. Ubaldo Glassing  . Cancer ([place]) 2021   LUNG  . Complication of anesthesia    DIFFICULTY WAKING UP AFTER SURGERY- 20 YRS AGO  . Hypertension   . Lung cancer ([place])   . Substance abuse ([place])      SURGICAL HISTORY   Past Surgical History:  Procedure Laterality Date  . ARM DEBRIDEMENT Left    INCISION AND DEBRIDEMENT LOWER ARM -20 YRS AGO  . BACK SURGERY    . IR PERC CHOLECYSTOSTOMY  12/20/2020  . PORTACATH PLACEMENT Left 08/29/2019   Procedure: INSERTION PORT-A-CATH;  Surgeon: Nestor Lewandowsky, MD;  Location: [facility] ORS;  Service: General;  Laterality: Left;     FAMILY HISTORY   History reviewed. No pertinent family history.   SOCIAL HISTORY   Social History   Tobacco Use  . Smoking status: Former    Packs/day: 0.50    Types: Cigarettes  . Smokeless tobacco: Never  . Tobacco comments:    not smoked since 6 weeks ago  Vaping Use  . Vaping Use: Never used  Substance Use Topics  . Alcohol use: Yes  Comment: 84 OZ IN WEEK  . Drug use: Not Currently    Types: Heroin    Comment: heroin WITHIN PAST YEAR     MEDICATIONS    Home Medication:    Current Medication:  Current Facility-Administered Medications:  .  0.9 %  sodium chloride infusion, , Intravenous, PRN, Sharen Hones, MD, Stopped at 12/25/20 1558 .  0.9 %  sodium chloride infusion, , , Continuous PRN, Criselda Peaches, MD, Last Rate: 10 mL/hr at 12/20/20 1220, 10 mL/hr at 12/20/20 1220 .  acetaminophen (TYLENOL) tablet 650 mg, 650 mg, Oral, Q6H PRN **OR** acetaminophen (TYLENOL) suppository 650 mg, 650 mg, Rectal, Q6H PRN, Cox, Amy N, DO .  albuterol (PROVENTIL) (2.5 MG/3ML) 0.083% nebulizer solution 2.5 mg, 2.5 mg, Nebulization, Q6H PRN,  Lanney Gins, Jameal Razzano, MD .  apixaban (ELIQUIS) tablet 5 mg, 5 mg, Oral, BID, Dallie Piles, RPH, 5 mg at 01/02/21 1132 .  chlorhexidine (PERIDEX) 0.12 % solution 10 mL, 10 mL, Mouth/Throat, BID, Earlie Server, MD, 10 mL at 12/28/20 2231 .  Chlorhexidine Gluconate Cloth 2 % PADS 6 each, 6 each, Topical, Daily, Cox, Amy N, DO, 6 each at 01/02/21 1133 .  diltiazem (CARDIZEM CD) 24 hr capsule 240 mg, 240 mg, Oral, Daily, Cox, Amy N, DO, 240 mg at 01/02/21 1133 .  diltiazem (CARDIZEM) tablet 30 mg, 30 mg, Oral, Q6H PRN, Cyndia Skeeters, Taye T, MD .  dronabinol (MARINOL) capsule 5 mg, 5 mg, Oral, BID AC, Earlie Server, MD, 5 mg at 01/02/21 1631 .  FLUoxetine (PROZAC) capsule 10 mg, 10 mg, Oral, Daily, Danford, Suann Larry, MD, 10 mg at 01/02/21 1138 .  folic acid (FOLVITE) tablet 1 mg, 1 mg, Oral, Daily, Sharen Hones, MD, 1 mg at 01/02/21 1132 .  furosemide (LASIX) tablet 20 mg, 20 mg, Oral, Daily, Jem Castro, MD, 20 mg at 01/02/21 1132 .  guaiFENesin (MUCINEX) 12 hr tablet 1,200 mg, 1,200 mg, Oral, BID, Dorothe Pea, RPH, 1,200 mg at 01/02/21 1132 .  mirtazapine (REMERON) tablet 7.5 mg, 7.5 mg, Oral, QHS, Danford, Suann Larry, MD, 7.5 mg at 01/01/21 2239 .  multivitamin with minerals tablet 1 tablet, 1 tablet, Oral, Daily, Sharen Hones, MD, 1 tablet at 01/02/21 1132 .  ondansetron (ZOFRAN) tablet 4 mg, 4 mg, Oral, Q6H PRN **OR** ondansetron (ZOFRAN) injection 4 mg, 4 mg, Intravenous, Q6H PRN, Cox, Amy N, DO .  oxyCODONE-acetaminophen (PERCOCET/ROXICET) 5-325 MG per tablet 1 tablet, 1 tablet, Oral, Q6H PRN, Mansy, Jan A, MD, 1 tablet at 12/24/20 0944 .  sodium chloride flush (NS) 0.9 % injection 10-40 mL, 10-40 mL, Intracatheter, Q12H, Manuella Ghazi, Vipul, MD, 10 mL at 01/02/21 1134 .  sodium chloride flush (NS) 0.9 % injection 10-40 mL, 10-40 mL, Intracatheter, PRN, Manuella Ghazi, Vipul, MD .  sodium chloride flush (NS) 0.9 % injection 5 mL, 5 mL, Intracatheter, Q8H, Criselda Peaches, MD, 5 mL at 01/02/21 1400 .   spironolactone (ALDACTONE) tablet 50 mg, 50 mg, Oral, Daily, Ottie Glazier, MD, 50 mg at 01/02/21 1132 .  thiamine tablet 100 mg, 100 mg, Oral, Daily, Sharen Hones, MD, 100 mg at 01/02/21 1132  Facility-Administered Medications Ordered in Other Encounters:  .  sodium chloride flush (NS) 0.9 % injection 10 mL, 10 mL, Intravenous, PRN, Mike Gip, Melissa C, MD, 10 mL at 11/11/19 2536    ALLERGIES   Patient has no known allergies.     REVIEW OF SYSTEMS    Review of Systems:  Gen:  Denies  fever, sweats, chills weigh  loss  HEENT: Denies blurred vision, double vision, ear pain, eye pain, hearing loss, nose bleeds, sore throat Cardiac:  No dizziness, chest pain or heaviness, chest tightness,edema Resp:   Denies cough or sputum porduction, shortness of breath,wheezing, hemoptysis,  Gi: Denies swallowing difficulty, stomach pain, nausea or vomiting, diarrhea, constipation, bowel incontinence Gu:  Denies bladder incontinence, burning urine Ext:   Denies Joint pain, stiffness or swelling Skin: Denies  skin rash, easy bruising or bleeding or hives Endoc:  Denies polyuria, polydipsia , polyphagia or weight change Psych:   Denies depression, insomnia or hallucinations   Other:  All other systems negative   VS: BP 113/70 (BP Location: Right Arm)   Pulse 82   Temp 97.9 F (36.6 C) (Oral)   Resp 12   Ht 6' (1.829 m)   Wt 69.8 kg   SpO2 98%   BMI 20.87 kg/m      PHYSICAL EXAM    GENERAL:NAD, no fevers, chills, no weakness no fatigue HEAD: Normocephalic, atraumatic.  EYES: Pupils equal, round, reactive to light. Extraocular muscles intact. No scleral icterus.  MOUTH: Moist mucosal membrane. Dentition intact. No abscess noted.  EAR, NOSE, THROAT: Clear without exudates. No external lesions.  NECK: Supple. No thyromegaly. No nodules. No JVD.  PULMONARY: Decreased BS on left CARDIOVASCULAR: S1 and S2. Regular rate and rhythm. No murmurs, rubs, or gallops. No edema. Pedal pulses  2+ bilaterally.  GASTROINTESTINAL: Soft, nontender, nondistended. No masses. Positive bowel sounds. No hepatosplenomegaly.  MUSCULOSKELETAL: No swelling, clubbing, or edema. Range of motion full in all extremities.  NEUROLOGIC: Cranial nerves II through XII are intact. No gross focal neurological deficits. Sensation intact. Reflexes intact.  SKIN: No ulceration, lesions, rashes, or cyanosis. Skin warm and dry. Turgor intact.  PSYCHIATRIC: Mood, affect within normal limits. The patient is awake, alert and oriented x 3. Insight, judgment intact.       IMAGING    CT ABDOMEN PELVIS WO CONTRAST  Result Date: 12/17/2020 CLINICAL DATA:  Nausea and vomiting EXAM: CT ABDOMEN AND PELVIS WITHOUT CONTRAST TECHNIQUE: Multidetector CT imaging of the abdomen and pelvis was performed following the standard protocol without IV contrast. COMPARISON:  CT 06/23/2020, PET CT 11/19/2020 FINDINGS: Lower chest: Lung bases demonstrate emphysema. Subpleural bandlike airspace disease at the left lower lobe redemonstrated. Incompletely visualized subpleural peripheral left upper lobe nodule measuring 11 mm, series 5, image 1. Cardiomegaly with trace pericardial effusion. Coronary vascular calcification. Hepatobiliary: Extensive motion degradation. No focal hepatic abnormality. Gallstones. Gallbladder slightly distended. Possible wall thickening or pericholecystic fluid though limited by motion Pancreas: Unremarkable. No pancreatic ductal dilatation or surrounding inflammatory changes. Spleen: Normal in size without focal abnormality. Adrenals/Urinary Tract: Thickened appearance of adrenal glands. No hydronephrosis. The bladder is unremarkable Stomach/Bowel: Stomach nonenlarged. No dilated small bowel. Negative appendix. No acute bowel wall thickening Vascular/Lymphatic: Advanced aortic atherosclerosis. No aneurysm. Slightly increased multiple small retroperitoneal lymph nodes. Reproductive: Prostate is unremarkable. Other:  Negative for free air or free fluid Musculoskeletal: No acute osseous abnormality. IMPRESSION: 1. Degraded by motion. 2. Gallbladder appears slightly distended and there are gallstones. Possible gallbladder wall thickening or pericholecystic fluid though limited by motion, consider correlation with ultrasound 3. Cardiomegaly with trace pericardial effusion. 4. Similar subpleural bandlike density at left lower lobe. Incompletely visualized peripheral left upper lobe lung nodule characterized on recent PET CT Electronically Signed   By: Donavan Foil M.D.   On: 12/17/2020 16:50   CT ANGIO HEAD NECK W WO CM  Result Date: 12/21/2020 CLINICAL DATA:  Neuro deficit, acute, stroke suspected. Neutropenic sepsis. EXAM: CT ANGIOGRAPHY HEAD AND NECK TECHNIQUE: Multidetector CT imaging of the head and neck was performed using the standard protocol during bolus administration of intravenous contrast. Multiplanar CT image reconstructions and MIPs were obtained to evaluate the vascular anatomy. Carotid stenosis measurements (when applicable) are obtained utilizing NASCET criteria, using the distal internal carotid diameter as the denominator. CONTRAST:  19mL OMNIPAQUE IOHEXOL 350 MG/ML SOLN COMPARISON:  MRI of the brain December 20, 2020. FINDINGS: CT HEAD FINDINGS Brain: Confluent hypodensity of the white matter of the cerebral hemispheres, nonspecific, most likely related to chronic small vessel ischemia. Foci of acute infarct are more conspicuous on recent MRI of the brain. No new large territorial infarct. No hemorrhage, hydrocephalus, mass or midline shift. Vascular: No hyperdense vessel or unexpected calcification. Skull: Normal. Negative for fracture or focal lesion. Sinuses: Increased thickness of the walls of the bilateral maxillary sinuses with bubbly secretion on the left. Findings are suggestive of chronic sinusitis. Right mastoid effusion. Orbits: No acute finding. Review of the MIP images confirms the above  findings CTA NECK FINDINGS Aortic arch: Standard branching. Imaged portion shows no evidence of aneurysm or dissection. Mild atherosclerotic changes of the aortic arch without significant stenosis of the major arch vessel origins. Right carotid system: Calcified atherosclerotic disease of the right carotid bifurcation without hemodynamically significant stenosis. Left carotid system: Atherosclerotic disease of the left carotid bifurcation with mixed density plaques without hemodynamically significant stenosis. Vertebral arteries: Calcified atherosclerotic plaque at the origin of the right vertebral artery without hemodynamically significant stenosis. Mild atherosclerotic changes at the origin of the left vertebral artery without hemodynamically significant stenosis. Cervical vertebral arteries otherwise have normal caliber Skeleton: Degenerative changes of the cervical spine with fusion of the facet joints at C2-3 on the left. No acute findings. Other neck: Negative. Upper chest: Left-sided pleural effusion and atelectasis. Mediastinal lymphadenopathy. Review of the MIP images confirms the above findings CTA HEAD FINDINGS Anterior circulation: Calcified atherosclerotic plaques in the bilateral carotid siphons. No significant stenosis, proximal occlusion, aneurysm, or vascular malformation. Posterior circulation: No significant stenosis, proximal occlusion, aneurysm, or vascular malformation. Hypoplastic/aplastic right P1/PCA segment with right fetal PCA. Venous sinuses: As permitted by contrast timing, patent. Anatomic variants: Right fetal PCA. Review of the MIP images confirms the above findings IMPRESSION: 1. Advanced chronic microvascular ischemic changes of the white matter. 2. Atherosclerotic disease of the bilateral carotid bifurcation without hemodynamically significant stenosis. 3. Mild atherosclerotic changes at the origin of the bilateral vertebral arteries without hemodynamically significant stenosis. 4.  Mild intracranial atherosclerotic disease without hemodynamically significant stenosis. Electronically Signed   By: Pedro Earls M.D.   On: 12/21/2020 15:32   DG Chest 1 View  Result Date: 12/25/2020 CLINICAL DATA:  Thoracentesis.  Lung cancer EXAM: CHEST  1 VIEW COMPARISON:  Radiograph 12/25/2020 FINDINGS: Interval reduction in LEFT pleural fluid following thoracentesis. No pneumothorax appreciated. A small basilar effusion remains. RIGHT lung clear. Port in the anterior chest wall with tip in distal SVC. IMPRESSION: 1. No pneumothorax appreciated following LEFT thoracentesis. 2. Considerable reduction in LEFT pleural fluid. Electronically Signed   By: Suzy Bouchard M.D.   On: 12/25/2020 17:28   DG Chest 2 View  Result Date: 12/27/2020 CLINICAL DATA:  Status post thoracentesis 2 days prior. EXAM: CHEST - 2 VIEW COMPARISON:  Radiograph same day (12/27/2020 at 830 hours FINDINGS: Persistent near complete opacification of the LEFT hemithorax. Small amount of aerated lung remains at LEFT lung apex. Port  in place. RIGHT lung clear. IMPRESSION: No interval change in near complete opacification LEFT hemithorax. Electronically Signed   By: Suzy Bouchard M.D.   On: 12/27/2020 18:17   CT HEAD WO CONTRAST (5MM)  Result Date: 12/17/2020 CLINICAL DATA:  Nausea vomiting EXAM: CT HEAD WITHOUT CONTRAST TECHNIQUE: Contiguous axial images were obtained from the base of the skull through the vertex without intravenous contrast. COMPARISON:  CT brain 06/24/2020, MRI 03/10/2020, head CT 11/22/2019 FINDINGS: Brain: No acute territorial infarction, hemorrhage or intracranial mass. Extensive white matter disease, progressed from more remote exams from 2021. Stable ventricle size. Chronic lacunar infarcts within the left white matter. Vascular: No hyperdense vessel.  Carotid vascular calcification Skull: Normal. Negative for fracture or focal lesion. Sinuses/Orbits: Mucosal thickening the sinuses. Chronic  appearing nasal deformity Other: None IMPRESSION: 1. No definite CT evidence for acute intracranial abnormality. 2. Atrophy. Extensive white matter disease, grossly stable as compared with recent head CT from March, appears progressive when compared to exams from 2021 and prior Electronically Signed   By: Donavan Foil M.D.   On: 12/17/2020 16:39   MR BRAIN W WO CONTRAST  Addendum Date: 12/21/2020   ADDENDUM REPORT: 12/21/2020 14:49 ADDENDUM: Patient returns for repeat postcontrast imaging. There is no abnormal enhancement. No evidence of intracranial metastatic disease. Electronically Signed   By: Macy Mis M.D.   On: 12/21/2020 14:49   Result Date: 12/21/2020 CLINICAL DATA:  Neuro deficit, acute, stroke suspected Small cell lung cancer, monitor EXAM: MRI HEAD WITHOUT AND WITH CONTRAST TECHNIQUE: Multiplanar, multiecho pulse sequences of the brain and surrounding structures were obtained without and with intravenous contrast. CONTRAST:  7.52mL GADAVIST GADOBUTROL 1 MMOL/ML IV SOLN COMPARISON:  CT head 12/17/2020. FINDINGS: Mildly motion limited exam.  Within this limitation: Brain: Multiple small areas of restricted diffusion in the bilateral frontal and occipital, and left parietal matter, left perirolandic cortex, left thalamocapsular region, and left cerebellum. Advanced confluent T2 hyperintensity within the white matter, nonspecific but compatible with chronic microvascular ischemic disease and progressed since 2021. Small remote infarct in the left corona radiata. No hydrocephalus, obvious mass lesion, midline shift, acute hemorrhage, or extra-axial fluid collection. Punctate foci of susceptibility artifact in the left cerebellum and left frontal white matter, nonspecific but potentially related to prior microhemorrhage. Contrast was administered; however, there is no evidence of enhancement (for example, the nasal mucosa/turbinates are nonenhancing). This precludes evaluation for enhancing lesions.  Vascular: Major arterial flow voids are maintained at the skull base. Skull and upper cervical spine: Normal marrow signal. Sinuses/Orbits: Mild paranasal sinus disease. Other: Moderate right and small left mastoid effusions. IMPRESSION: 1. Multiple small areas of restricted diffusion in the bilateral frontal and occipital and left parietal white matter, left perirolandic cortex, left thalamocapsular region, and left cerebellum, which are concerning for acute infarcts. Given involvement of multiple vascular territories, consider a central embolic etiology. 2. Contrast was administered; however, there is no evidence of enhancement, unclear why given reported uneventful injection per the technologist. This precludes evaluation for enhancing lesions. Given the patient's known small cell lung cancer diagnosis, recommend repeat postcontrast imaging to exclude metastatic disease. I've asked the MRI technologist to not bill the patient for the repeat post-contrast imaging. 3. Advanced chronic microvascular ischemic disease, progressed since 2021. 4. Moderate right and small left mastoid effusions. Electronically Signed: By: Margaretha Sheffield M.D. On: 12/20/2020 14:29   IR Perc Cholecystostomy  Result Date: 12/20/2020 INDICATION: 70 year old male with small cell lung cancer and chemotherapy induced pancytopenia presenting with  neutropenic sepsis. Blood cultures grew out Klebsiella and imaging findings are concerning for acute cholecystitis. He is too sick and frail to be considered an operative candidate. Following transfusion of platelets, his platelets are now at an acceptable level to proceed with percutaneous cholecystostomy tube placement. EXAM: CHOLECYSTOSTOMY MEDICATIONS: In patient currently receiving intravenous antibiotic therapy. No additional antibiotic prophylaxis administered. ANESTHESIA/SEDATION: Moderate (conscious) sedation was employed during this procedure. A total of Versed 1 mg and Fentanyl 50 mcg  and 0.5 mg Dilaudid administered intravenously. Moderate Sedation Time: 13 minutes. The patient's level of consciousness and vital signs were monitored continuously by radiology nursing throughout the procedure under my direct supervision. FLUOROSCOPY TIME:  Fluoroscopy Time: 0 minutes 30 seconds (4.3 mGy). COMPLICATIONS: None immediate. PROCEDURE: Informed written consent was obtained from the patient after a thorough discussion of the procedural risks, benefits and alternatives. All questions were addressed. Maximal Sterile Barrier Technique was utilized including caps, mask, sterile gowns, sterile gloves, sterile drape, hand hygiene and skin antiseptic. A timeout was performed prior to the initiation of the procedure. The right upper quadrant was interrogated with ultrasound. The distended gallbladder with a thickened wall was successfully identified. There is significant excursion of the liver with respiration due to use of abdominal muscles. A suitable skin entry site was selected. Local anesthesia was attained by infiltration with 1% lidocaine. A small dermatotomy was made. Under real-time ultrasound guidance, the movement of the liver was timed with respirations and in a single pass, a 21 gauge Accustick needle was advanced through the liver and into the gallbladder lumen along a short transhepatic course. A 0.018 wire was quickly advanced into the gallbladder lumen and the needle was exchanged for the transitional dilator. The transitional dilator was advanced into the gallbladder lumen. The wire was removed. Contrast injection was performed under fluoroscopy confirming opacification of the gallbladder lumen. Multiple filling defects consistent with cholelithiasis. The cystic duct is occluded. A 0.035 wire was then coiled within the gallbladder lumen. The transitional dilator was removed. The transhepatic tract was dilated to 10 [place] with a stiff dilator. A Cook 10.2 [place] all-purpose drainage catheter  was then advanced over the wire and formed in the gallbladder lumen. Images were obtained and stored for the medical record. The catheter was gently flushed and connected to gravity bag drainage before being secured to the skin with 0 silk suture. IMPRESSION: Successful placement of 10 French transhepatic percutaneous cholecystostomy tube for an indication of calculus cholecystitis. PLAN: Maintain tube to gravity bag drainage. Patient will likely need home health to assist with care and cleaning of the tube site. Return to interventional radiology approximately every 8 weeks for cholecystostomy tube check and exchange. If patient's clinical status improves in the future, elective cholecystectomy would be optimal. If patient remains a poor operative candidate, long-term cholecystostomy tube maintenance will likely be required. Electronically Signed   By: Jacqulynn Cadet M.D.   On: 12/20/2020 13:50   DG Chest Port 1 View  Result Date: 12/27/2020 CLINICAL DATA:  Shortness of breath with atelectasis common thoracentesis 2 days ago EXAM: PORTABLE CHEST 1 VIEW COMPARISON:  Chest radiograph 12/25/2020 FINDINGS: The left chest wall port is in stable position with the tip terminating in the region of the right atrium. The cardiomediastinal silhouette can not be assessed. The left pleural effusion has increased in size with worsening aeration of the left upper lobe. Aeration of the right lung is not significantly changed. There is no significant right effusion. There is no appreciable pneumothorax. There  is no acute osseous abnormality. Enteric contrast is noted in the colon. IMPRESSION: Increased size of the large left pleural effusion with worsened aeration of the left lung. Electronically Signed   By: Valetta Mole M.D.   On: 12/27/2020 09:12   DG Chest Port 1 View  Result Date: 12/25/2020 CLINICAL DATA:  Shortness of breath EXAM: PORTABLE CHEST 1 VIEW COMPARISON:  Chest x-ray 12/17/2020, PET CT 11/19/2020  FINDINGS: Left-sided central venous port tip over right atrium. Interval complete opacification of left thorax, suspect that there is at least moderate pleural effusion. Cardiomediastinal silhouette is obscured. No pneumothorax is visualized. IMPRESSION: Interval opacification of the entire left thorax which may be due to combination of pleural effusion/atelectasis/airspace disease. Electronically Signed   By: Donavan Foil M.D.   On: 12/25/2020 15:19   DG Chest Port 1 View  Result Date: 12/17/2020 CLINICAL DATA:  Questionable sepsis.  Weakness EXAM: PORTABLE CHEST 1 VIEW COMPARISON:  03/09/2020 FINDINGS: Left subclavian approached chest port remains in place. Stable cardiomegaly. Chronic left basilar scarring or atelectasis. Peripheral left upper lobe nodule better seen on prior CT. No new airspace consolidation. Pleural effusion or pneumothorax. IMPRESSION: Chronic left basilar scarring or atelectasis. No new or acute findings. Electronically Signed   By: Davina Poke D.O.   On: 12/17/2020 13:20   DG Swallowing Func-Speech Pathology  Result Date: 12/25/2020 Table formatting from the original result was not included. Objective Swallowing Evaluation: Type of Study: MBS-Modified Barium Swallow Study  Patient Details Name: Scott Jordan MRN: [id number] Date of Birth: 12/16/1950 Today's Date: 12/25/2020 Time: SLP Start Time (ACUTE ONLY): 37 -SLP Stop Time (ACUTE ONLY): 1300 SLP Time Calculation (min) (ACUTE ONLY): 30 min Past Medical History: Past Medical History: Diagnosis Date . A-fib ([place]) 01/10/2019  pt st this was dx by Dr. Ubaldo Glassing . Cancer ([place]) 2021  LUNG . Complication of anesthesia   DIFFICULTY WAKING UP AFTER SURGERY- 20 YRS AGO . Hypertension  . Lung cancer ([place])  . Substance abuse [facility])  Past Surgical History: Past Surgical History: Procedure Laterality Date . ARM DEBRIDEMENT Left   INCISION AND DEBRIDEMENT LOWER ARM -20 YRS AGO . BACK SURGERY   . IR PERC CHOLECYSTOSTOMY  12/20/2020 . PORTACATH  PLACEMENT Left 08/29/2019  Procedure: INSERTION PORT-A-CATH;  Surgeon: Nestor Lewandowsky, MD;  Location: [facility] ORS;  Service: General;  Laterality: Left; HPI: Pt is a 70 year old male with PMH of 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 fib not on AC, chronic hep C, chronic cancer pain, HTN, alcohol abuse, malnutrition and polysubstance who is admitted with neutropenic sepsis due to acute cholecystitis and Klebsiella bacteremia now s/p cholecystostomy on 9/11 by IR. Hospital admission complicated by acute thromboembolic stroke with new right-sided weakness-confirmed on MRI brain on 12/20/2020.  MRI: Multiple small areas of restricted diffusion in the bilateral  frontal and occipital and left parietal white matter, left  perirolandic cortex, left thalamocapsular region, and left  cerebellum, which are concerning for acute infarcts.  CXR at admit: Chronic left basilar scarring or atelectasis. No new or acute  findings.  Pt is followed by Palliative Care at [facility].  Subjective: pt able to communicate his wants/needs Assessment / Plan / Recommendation CHL IP CLINICAL IMPRESSIONS 12/25/2020 Clinical Impression Pt presents with adequate airway protection while consuming thin liquids, nectar thick liquids, puree and whole barium tablet with thin liquids. Pt's oral phase is unremarkable and his swallow response is timely with good airway protection. His overall medical comorbidities  and respiratory decline place him at an overall increased risk of aspiration. For example, when consuming consecutive sips of thin liquids, pt became short of breath. At this time, recommend pt continue consuming dysphagia 3 diet for energy conservation with thin liquids, medicine whole with thin liquids. Pt needs to follow strict aspiration precautions such as taking single sips and rest breaks. SLP Visit Diagnosis Dysphagia, oropharyngeal phase (R13.12) Attention and concentration deficit following --  Frontal lobe and executive function deficit following -- Impact on safety and function Mild aspiration risk;Moderate aspiration risk   CHL IP TREATMENT RECOMMENDATION 12/25/2020 Treatment Recommendations No treatment recommended at this time   Prognosis 12/24/2020 Prognosis for Safe Diet Advancement [place] Barriers to Reach Goals Motivation;Time post onset;Severity of deficits;Behavior Barriers/Prognosis Comment -- CHL IP DIET RECOMMENDATION 12/25/2020 SLP Diet Recommendations Dysphagia 3 (Mech soft) solids;Thin liquid Liquid Administration via Cup Medication Administration Whole meds with liquid Compensations Minimize environmental distractions;Slow rate;Small sips/bites Postural Changes Remain semi-upright after after feeds/meals (Comment);Seated upright at 90 degrees   CHL IP OTHER RECOMMENDATIONS 12/25/2020 Recommended Consults -- Oral Care Recommendations Oral care BID Other Recommendations --   CHL IP FOLLOW UP RECOMMENDATIONS 12/25/2020 Follow up Recommendations None   CHL IP FREQUENCY AND DURATION 12/24/2020 Speech Therapy Frequency (ACUTE ONLY) min 3x week Treatment Duration 2 weeks      CHL IP ORAL PHASE 12/25/2020 Oral Phase WFL Oral - Pudding Teaspoon -- Oral - Pudding Cup -- Oral - Honey Teaspoon -- Oral - Honey Cup -- Oral - Nectar Teaspoon -- Oral - Nectar Cup -- Oral - Nectar Straw -- Oral - Thin Teaspoon -- Oral - Thin Cup -- Oral - Thin Straw -- Oral - Puree -- Oral - Mech Soft -- Oral - Regular -- Oral - Multi-Consistency -- Oral - Pill -- Oral Phase - Comment --  CHL IP PHARYNGEAL PHASE 12/25/2020 Pharyngeal Phase WFL Pharyngeal- Pudding Teaspoon -- Pharyngeal -- Pharyngeal- Pudding Cup -- Pharyngeal -- Pharyngeal- Honey Teaspoon -- Pharyngeal -- Pharyngeal- Honey Cup -- Pharyngeal -- Pharyngeal- Nectar Teaspoon -- Pharyngeal -- Pharyngeal- Nectar Cup -- Pharyngeal -- Pharyngeal- Nectar Straw -- Pharyngeal -- Pharyngeal- Thin Teaspoon -- Pharyngeal -- Pharyngeal- Thin Cup -- Pharyngeal -- Pharyngeal- Thin  Straw -- Pharyngeal -- Pharyngeal- Puree -- Pharyngeal -- Pharyngeal- Mechanical Soft -- Pharyngeal -- Pharyngeal- Regular -- Pharyngeal -- Pharyngeal- Multi-consistency -- Pharyngeal -- Pharyngeal- Pill -- Pharyngeal -- Pharyngeal Comment --  CHL IP CERVICAL ESOPHAGEAL PHASE 12/25/2020 Cervical Esophageal Phase WFL Pudding Teaspoon -- Pudding Cup -- Honey Teaspoon -- Honey Cup -- Nectar Teaspoon -- Nectar Cup -- Nectar Straw -- Thin Teaspoon -- Thin Cup -- Thin Straw -- Puree -- Mechanical Soft -- Regular -- Multi-consistency -- Pill -- Cervical Esophageal Comment -- Happi B. [name] M.S., CCC-SLP, [place] Office [phone] Stormy Fabian 12/25/2020, 2:17 PM              ECHOCARDIOGRAM COMPLETE  Result Date: 12/21/2020    ECHOCARDIOGRAM REPORT   Patient Name:   Scott Jordan Date of Exam: 12/21/2020 Medical Rec #:  [id number]    Height:       72.0 in Accession #:    [id number]   Weight:       137.1 lb Date of Birth:  [birth year]     BSA:          1.815 m Patient Age:    70 years     BP:           114/80  mmHg Patient Gender: M            HR:           89 bpm. Exam Location:  [facility] Procedure: 2D Echo, Color Doppler and Cardiac Doppler Indications:     Endocarditis I38  History:         Patient has prior history of Echocardiogram examinations, most                  recent 08/19/2019. Arrythmias:Atrial Fibrillation; Risk                  Factors:Hypertension. Substance abuse.  Sonographer:     Sherrie Sport Referring Phys:  [place] Diagnosing Phys: Serafina Royals MD IMPRESSIONS  1. Left ventricular ejection fraction, by estimation, is 60 to 65%. The left ventricle has normal function. The left ventricle has no regional wall motion abnormalities. Left ventricular diastolic parameters were normal.  2. Right ventricular systolic function is normal. The right ventricular size is normal.  3. The mitral valve is normal in structure. Trivial mitral valve regurgitation.  4.  The aortic valve is normal in structure. Aortic valve regurgitation is not visualized. Mild aortic valve sclerosis is present, with no evidence of aortic valve stenosis. Conclusion(s)/Recommendation(s): No evidence of valve vegetations. FINDINGS  Left Ventricle: Left ventricular ejection fraction, by estimation, is 60 to 65%. The left ventricle has normal function. The left ventricle has no regional wall motion abnormalities. The left ventricular internal cavity size was small. There is no left ventricular hypertrophy. Left ventricular diastolic parameters were normal. Right Ventricle: The right ventricular size is normal. No increase in right ventricular wall thickness. Right ventricular systolic function is normal. Left Atrium: Left atrial size was normal in size. Right Atrium: Right atrial size was normal in size. Pericardium: There is no evidence of pericardial effusion. Mitral Valve: The mitral valve is normal in structure. Trivial mitral valve regurgitation. Tricuspid Valve: The tricuspid valve is normal in structure. Tricuspid valve regurgitation is trivial. Aortic Valve: The aortic valve is normal in structure. Aortic valve regurgitation is not visualized. Mild aortic valve sclerosis is present, with no evidence of aortic valve stenosis. Aortic valve mean gradient measures 3.0 mmHg. Aortic valve peak gradient measures 5.9 mmHg. Aortic valve area, by VTI measures 2.30 cm. Pulmonic Valve: The pulmonic valve was normal in structure. Pulmonic valve regurgitation is not visualized. Aorta: The aortic root and ascending aorta are structurally normal, with no evidence of dilitation. IAS/Shunts: No atrial level shunt detected by color flow Doppler.  LEFT VENTRICLE PLAX 2D LVIDd:         3.87 cm LVIDs:         2.30 cm LV PW:         1.00 cm LV IVS:        1.18 cm LVOT diam:     2.10 cm LV SV:         44 LV SV Index:   24 LVOT Area:     3.46 cm  RIGHT VENTRICLE RV Basal diam:  3.70 cm RV S prime:     12.40 cm/s TAPSE  (M-mode): 3.7 cm LEFT ATRIUM           Index       RIGHT ATRIUM           Index LA diam:      3.50 cm 1.93 cm/m  RA Area:     33.70 cm LA Vol (A2C): 57.4 ml 31.63 ml/m  RA Volume:   128.00 ml 70.53 ml/m LA Vol (A4C): 72.1 ml 39.73 ml/m  AORTIC VALVE                   PULMONIC VALVE AV Area (Vmax):    2.34 cm    PV Vmax:        0.60 m/s AV Area (Vmean):   2.32 cm    PV Peak grad:   1.4 mmHg AV Area (VTI):     2.30 cm    RVOT Peak grad: 2 mmHg AV Vmax:           121.00 cm/s AV Vmean:          86.000 cm/s AV VTI:            0.193 m AV Peak Grad:      5.9 mmHg AV Mean Grad:      3.0 mmHg LVOT Vmax:         81.60 cm/s LVOT Vmean:        57.600 cm/s LVOT VTI:          0.128 m LVOT/AV VTI ratio: 0.66  AORTA Ao Root diam: 3.50 cm MITRAL VALVE               TRICUSPID VALVE MV Area (PHT): 4.52 cm    TR Peak grad:   24.4 mmHg MV Decel Time: 168 msec    TR Vmax:        247.00 cm/s MV E velocity: 88.30 cm/s                            SHUNTS                            Systemic VTI:  0.13 m                            Systemic Diam: 2.10 cm Serafina Royals MD Electronically signed by Serafina Royals MD Signature Date/Time: 12/21/2020/5:20:09 PM    Final    US Abdomen Limited RUQ (LIVER/GB)  Result Date: 12/18/2020 CLINICAL DATA:  Evaluate for cholecystitis. EXAM: ULTRASOUND ABDOMEN LIMITED RIGHT UPPER QUADRANT COMPARISON:  CT AP from 12/17/2020 FINDINGS: Gallbladder: The gallbladder wall is diffusely edematous measuring up to 13.1 mm. Gallstones are noted measuring up to 5.9 mm. Pericholecystic fluid. Positive sonographic Murphy's sign. Common bile duct: Diameter: 6 mm Liver: Diffusely increased parenchymal echogenicity identified. No focal liver abnormality. Portal vein is patent on color Doppler imaging with normal direction of blood flow towards the liver. Other: 1.9 cm right kidney cyst. IMPRESSION: Gallstones, gallbladder wall thickening and pericholecystic fluid with positive sonographic Murphy's sign. Findings are  compatible with acute cholecystitis. Electronically Signed   By: Kerby Moors M.D.   On: 12/18/2020 14:18   US THORACENTESIS ASP PLEURAL SPACE W/IMG GUIDE  Result Date: 12/25/2020 INDICATION: Large left pleural effusion secondary to known lung cancer. Request for diagnostic and therapeutic thoracentesis. EXAM: ULTRASOUND GUIDED LEFT THORACENTESIS MEDICATIONS: 1% lidocaine 10 mL COMPLICATIONS: None immediate. PROCEDURE: An ultrasound guided thoracentesis was thoroughly discussed with the patient and questions answered. The benefits, risks, alternatives and complications were also discussed. The patient understands and wishes to proceed with the procedure. Written consent was obtained. Ultrasound was performed to localize and mark an adequate pocket of fluid in the left chest. The area was then prepped and  draped in the normal sterile fashion. 1% Lidocaine was used for local anesthesia. Under ultrasound guidance a 6 Fr Safe-T-Centesis catheter was introduced. Thoracentesis was performed. The catheter was removed and a dressing applied. FINDINGS: A total of approximately 700 mL of hazy yellow fluid was removed. Samples were sent to the laboratory as requested by the clinical team. IMPRESSION: Successful ultrasound guided left thoracentesis yielding 700 mL of pleural fluid. Read by: Gareth Eagle, PA-C Electronically Signed   By: Michaelle Birks M.D.   On: 12/25/2020 16:44      ASSESSMENT/PLAN   Acute on chronic hypoxemic respiratory failure-RESOLVED  There is complete atelectasis of left lung secondary to left pleural effusion     - stop heparin transiently and perform diagnostic and therapeutic thoracentesis   - Pleural fluid studies ordered both infections and malignant workup    - Reviewed with patient he is agreeable to plan -metaneb bid with RT  Sepsis with Klebsiella bacteremia  On IV antibiotics    - ID on case appreciate input   Extensive stage small cell lung ca Currently on  chemo/immunotherapy  - oncology on case - prognosis poor - appreciate input -Palliative care    Thank you for allowing me to participate in the care of this patient.   Patient/Family are satisfied with care plan and all questions have been answered.  This document was prepared using Dragon voice recognition software and may include unintentional dictation errors.     Ottie Glazier, M.D.  Division of [place]

## 2021-01-02 NOTE — Care Plan (Signed)
Spoke with niece/POA.  She describes her meeting with Josh Borders differently than how Merrily Pew described it to me.  She relates that she and her uncle jointly agreed for him to enter residential hospice, that they voiced this in their discussion with Josh on Thursday and that her uncle has been consistent in this decision.  Their hope is that her uncle can get stronger at [facility] and then potentially restart chemo.    Will consult hospice.  Patient remains medically ready for discharge.

## 2021-01-02 NOTE — Progress Notes (Signed)
Pt to be discharged today per primary RN.

## 2021-01-02 NOTE — TOC Progression Note (Signed)
Transition of Care [facility]) - Progression Note    Patient Details  Name: Scott Jordan MRN: [id number] Date of Birth: [birth year]  Transition of [facility]) CM/SW Contact  Izola Price, RN Phone Number: 01/02/2021, 10:46 AM  Clinical Narrative:  Patient scheduled to be discharged/transported to [facility]. Voice Mail and text message left for [facility] coordinator this weekend [facility] [phone]). Discharge Summary to be faxed/inboxed to [facility]. Will arrange transport via [facility] when ready. Will contact niece when arrangements in place.  Monna Fam (Niece)  [phone] (Mobile) Simmie Davies RN CM     Expected Discharge Plan: [place] Barriers to Discharge: Barriers Unresolved (comment)  Expected Discharge Plan and Services Expected Discharge Plan: [place] In-house Referral: Clinical Social Work   Post Acute Care Choice: [place] Living arrangements for the past 2 months: [place] Expected Discharge Date: 01/02/21               DME Arranged: N/A DME Agency: NA       HH Arranged: NA HH Agency: NA         Social Determinants of Health (SDOH) Interventions    Readmission Risk Interventions No flowsheet data found.

## 2021-01-02 NOTE — TOC Progression Note (Signed)
Transition of Care [facility]) - Progression Note    Patient Details  Name: Scott Jordan MRN: [id number] Date of Birth: 05-Nov-1950  Transition of Care [facility]) CM/SW Contact  Izola Price, RN Phone Number: 01/02/2021, 3:31 PM  Clinical Narrative: Update: 01/02/21: Discharged canceled disposition changed to Inpatient Hospice consult, per POA niece and patient wishes. Spoke with niece and local hospice would be preferable. Referral made to Hospice Home via [facility] at intake. Referred to Palliative/Hospice consult note. Simmie Davies RN CM      Expected Discharge Plan: Skilled Nursing Facility Barriers to Discharge: Barriers Resolved  Expected Discharge Plan and Services Expected Discharge Plan: [place] In-house Referral: Clinical Social Work   Post Acute Care Choice: [place] Living arrangements for the past 2 months: [place] Expected Discharge Date: 01/02/21               DME Arranged: N/A DME Agency: NA       HH Arranged: NA HH Agency: NA         Social Determinants of Health (SDOH) Interventions    Readmission Risk Interventions No flowsheet data found.

## 2021-01-03 DIAGNOSIS — J90 Pleural effusion, not elsewhere classified: Secondary | ICD-10-CM

## 2021-01-03 DIAGNOSIS — T451X5A Adverse effect of antineoplastic and immunosuppressive drugs, initial encounter: Secondary | ICD-10-CM

## 2021-01-03 DIAGNOSIS — D6481 Anemia due to antineoplastic chemotherapy: Secondary | ICD-10-CM

## 2021-01-03 DIAGNOSIS — K8000 Calculus of gallbladder with acute cholecystitis without obstruction: Secondary | ICD-10-CM

## 2021-01-03 NOTE — Evaluation (Signed)
Occupational Therapy Re-evaluation Patient Details Name: Scott Jordan MRN: [id number] DOB: Sep 14, 1950 Today's Date: 01/03/2021   History of Present Illness Pt is a 70 y.o. M arriving to ED for c/o nausea, vomiting and admitted for neutropenic sepsis,severe neutropenia, cholecystitis. PMH includes stage IV small cell carcinoma, anemia, thrombocytopenia, hx of substance abuse, chronic hepatitis C, HTN., a-fib. During hospitalization has been complicated by acute CVA with residual right-sided weakness. During hospitalization multifocal acute embolic strokes occured in bilateral cerebral hemisphere & L cerebellum.   Clinical Impression   Pt seen for OT re-evaluation on this date in setting of prolonged hospitalization. Upon arrival to room, pt awake and seated upright in bed with brother Vonna Drafts) present. Pt verbalized goal to walk again and was agreeable to session following MIN encouragement. Pt continues to present with decreased strength, decreased balance, and decreased activity tolerance. Due to these functional impairments, pt requires MIN A for bed-level UB dressing, MIN A for bed mobility, MAX A for seated LB dressing, and MIN A +2 for static standing balance. This date, pt able to progress to standing for 60 sec with BUE support from RW. Since initial evaluation, pt has met 2/3 goals and third goal has been upgraded to reflect pt's current progress. Pt continues to benefit from skilled OT services to maximize return to PLOF and minimize risk of future falls, injury, caregiver burden, and readmission. Will continue to follow POC. Discharge recommendation remains appropriate.       Recommendations for follow up therapy are one component of a multi-disciplinary discharge planning process, led by the attending physician.  Recommendations may be updated based on patient status, additional functional criteria and insurance authorization.   Follow Up Recommendations  SNF    Equipment Recommendations   Other (comment) (defer to next venue of care)       Precautions / Restrictions Precautions Precautions: Fall Restrictions Weight Bearing Restrictions: No      Mobility Bed Mobility Overal bed mobility: Needs Assistance Bed Mobility: Supine to Sit;Sit to Supine     Supine to sit: Min assist;HOB elevated Sit to supine: Min assist   General bed mobility comments: MIN A for trunk support    Transfers Overall transfer level: Needs assistance Equipment used: Rolling walker (2 wheeled) Transfers: Sit to/from Stand Sit to Stand: Mod assist;+2 physical assistance         General transfer comment: MOD A + 2 for transfer , MIN A + 2 for static stance and stand > sit    Balance Overall balance assessment: Needs assistance Sitting-balance support: Single extremity supported;Feet supported Sitting balance-Leahy Scale: Fair Sitting balance - Comments: MIN GUARD in setting of pt leanin elbows on LE for support due to fatigue. Requires cues for upright trunk Postural control: Left lateral lean Standing balance support: Bilateral upper extremity supported;During functional activity Standing balance-Leahy Scale: Poor Standing balance comment: Static stance 60 sec w/ MIN A + 2. Requires cues for upright trunk                           ADL either performed or assessed with clinical judgement   ADL Overall ADL's : Needs assistance/impaired                 Upper Body Dressing : Minimal assistance;Bed level Upper Body Dressing Details (indicate cue type and reason): to don/doff gown Lower Body Dressing: Maximal assistance;Sitting/lateral leans Lower Body Dressing Details (indicate cue type and reason): to don/doff socks  Pertinent Vitals/Pain Pain Assessment: No/denies pain              Cognition Arousal/Alertness: Awake/alert Behavior During Therapy: WFL for tasks assessed/performed Overall Cognitive Status: Within Functional  Limits for tasks assessed                                 General Comments: pt verbalizing goal to walk again at beginning of session. Following MIN encouragement, agreeable to OOB mobility.   General Comments  Pt on RA, SpO2 87% post bed mobility; 2L O2 [place] provided SpO2 increased to 93%; HRmax 110-115 w/ mobility            OT Goals(Current goals can be found in the care plan section) Acute Rehab OT Goals Patient Stated Goal: to walk again OT Goal Formulation: With patient Time For Goal Achievement: Jan 23, 2021 Potential to Achieve Goals: Good ADL Goals Pt Will Perform Grooming: with min guard assist;sitting Pt Will Perform Upper Body Dressing: with min guard assist;sitting  OT Frequency: Min 1X/week          AM-PAC OT "6 Clicks" Daily Activity     Outcome Measure Help from another person eating meals?: A Little Help from another person taking care of personal grooming?: A Lot Help from another person toileting, which includes using toliet, bedpan, or urinal?: A Lot Help from another person bathing (including washing, rinsing, drying)?: A Lot Help from another person to put on and taking off regular upper body clothing?: A Little Help from another person to put on and taking off regular lower body clothing?: A Lot 6 Click Score: 14   End of Session Equipment Utilized During Treatment: Gait belt;Rolling walker;Oxygen Nurse Communication: Mobility status  Activity Tolerance: Patient tolerated treatment well Patient left: in bed;with call bell/phone within reach;with bed alarm set;with family/visitor present  OT Visit Diagnosis: Unsteadiness on feet (R26.81);Muscle weakness (generalized) (M62.81)                Time: 4403-4742 OT Time Calculation (min): 23 min Charges:  OT Evaluation $OT Re-eval: 1 Re-eval OT Treatments $Self Care/Home Management : 8-22 mins $Therapeutic Activity: 8-22 mins Fredirick Maudlin, OTR/L [place]

## 2021-01-03 NOTE — Progress Notes (Signed)
[facility] PROGRESS NOTE    Scott Jordan  GQQ:[id number] DOB: 1950/08/23 DOA: 12/17/2020 PCP: Earlie Server, MD    Brief Narrative:  Scott Jordan is a 70 y.o. M with stage IV small cell lung cancer on chemo, A. fib not on anticoagulation, chronic hep C, HTN, history of alcohol use and polysubstance abuse who presented with nausea and vomiting. In the ER, he was confused.  WBC 200, platelets 7K, RR 43.  CT abdomen showed distended gallbladder with stones.  Admitted and started on antibiotics. 9/11: IR Perc drain placed in GB 9/11: Developed acute right arm weakness, MRI brain confirms embolic stroke 6/71: noted to have large pleural effusion, thoracentesis done, no malignant cells    Assessment & Plan:  Severe sepsis due to Klebsiella pneumoniae bacteremia due to cholecystitis Patient with tachypneic, leukopenia and confusion at admission.  Source: gallbladder.  Patient admitted and underwent percutaneous drain on 9/11.   Completed 9 days Flagyl and 10 days IV Rocephin per ID.   Remains afebrile.  Appetite remains poor.     Stage IV small cell lung cancer with metastasis to bone/Chemotherapy related pancytopenia Thrombocytopenia and leukopenia resolved from admission. The patient desires further chemotherapeutic treatments.  Because of his malnutrition, new CVAs, he is too weak at present to continue therapy.  If he were to strengthen, improve his appetite/oral intake, and start treatment for depressed mood, Oncology feel he would be able to resume life-prolonging treatments.   Initially it appears that the plan was for him to go to skilled nursing facility for rehab.  However subsequently based on patient's power of attorney is meeting with palliative care she and the patient agreed for him to enter residential hospice.  Patient states today that he does not want to go to residential hospice.   Plan was for him to be discharged to SNF yesterday but this was canceled.  There still  remains a lot of confusion about disposition.  We will request palliative care to reevaluate patient tomorrow.  New bilateral embolic CVA Developed new onset right arm weakness during this hospitalization and MRI brain was performed that showed multifocal acute embolic appearing strokes in the bilateral cerebral hemispheres.  Patient was seen by neurology.  Plan is to continue apixaban.  No TEE given concern for aspiration.  No statin due to LDL of 37.  Outpatient neurology follow-up. Neurological status noted to be stable.  Paroxysmal atrial fibrillation with RVR Stable.  Continue diltiazem.  Continue apixaban.     Left pleural effusion Underwent thoracentesis on 9/16, the symptoms resolved. Continue Lasix and spironolactone.   Acute kidney injury Resolved  Normocytic anemia Likely due to chronic disease.  No evidence of overt bleeding.  Polysubstance use disorder  Hyponatremia Resolved  Hypokalemia Hypomagnesemia Resolved   Urinary retention Transient, foley removed.   Severe protein calorie malnutrition - Continue dronabinol - Stop Megace - Start mirtazapine  Depression - Patient was started on Prozac   Bilateral deep tissue injuries to the posterior head, present on arrival     Disposition: To be determined  Status is: Inpatient  Remains inpatient appropriate because:Unsafe d/c plan  Dispo: The patient is from: Home              Anticipated d/c is to:  TBD              Patient currently is not medically stable to d/c.   Difficult to place patient No      DVT prophylaxis: On  apixaban Code Status: DNR Family Communication: No family at bedside.    Consultants:  Oncology Palliative Care Pulmonology Neurology Infectious disease Cardiology  Procedures:  9/11-PERC drain into gallbladder 9/16 left-sided thoracentesis  Antimicrobials:  Rocephin 9/11-9/18 Flagyl 9/12-9/17  Culture data:  9/13 blood culture x2, no growth to  date     Subjective: Patient does not want to go to hospice yet.  He states that he wants to get stronger.  It appears that there has been a lot of back-and-forth about disposition over the last 48 hours.  He was supposed to go to SNF yesterday but that was canceled.  We will need to sort these issues out before discharge.  Otherwise he denies any pain nausea or vomiting.  Objective: Vitals:   01/02/21 1554 01/02/21 2000 01/03/21 0400 01/03/21 0819  BP: 113/70 130/72 129/80 132/86  Pulse: 82 80 85 97  Resp: 12 12 14 12   Temp: 97.9 F (36.6 C) 98 F (36.7 C) 97.9 F (36.6 C) 98.9 F (37.2 C)  TempSrc: Oral   Oral  SpO2: 98% 99% 99% 96%  Weight:      Height:        Intake/Output Summary (Last 24 hours) at 01/03/2021 1116 Last data filed at 01/03/2021 0919 Gross per 24 hour  Intake 250 ml  Output 150 ml  Net 100 ml    Filed Weights   12/24/20 1428 12/31/20 0529 01/01/21 0500  Weight: 70.8 kg 68.7 kg 69.8 kg    Examination:  General appearance: Cachectic.  Awake alert.  In no distress.  Fatigued. Resp: Diminished air entry bilaterally.  Occasional wheezing.  Few crackles at the bases Cardio: S1-S2 is normal regular.  No S3-S4.  No rubs murmurs or bruit GI: Abdomen is soft.  Percutaneous cholecystostomy drain tube noted.  Nontender. Extremities: No edema.  Full range of motion of lower extremities.      Data Reviewed: I have personally reviewed following labs and imaging studies:  CBC: Recent Labs  Lab 12/29/20 0925 12/30/20 0215 12/31/20 0534 01/01/21 0520 01/02/21 0457  WBC 14.7* 14.7* 12.4* 11.7* 8.7  HGB 8.4* 8.1* 7.8* 8.0* 9.4*  HCT 24.0* 23.6* 22.8* 23.0* 28.2*  MCV 96.4 97.9 96.2 96.2 95.6  PLT 508* 539* 549* 536* 465*    Basic Metabolic Panel: Recent Labs  Lab 12/28/20 0515  NA 136  K 3.8  CL 108  CO2 22  GLUCOSE 93  BUN 10  CREATININE 0.62  CALCIUM 7.6*  MG 1.8  PHOS 2.9    GFR: Estimated Creatinine Clearance: 86 mL/min (by C-G  formula based on SCr of 0.62 mg/dL). Liver Function Tests: Recent Labs  Lab 12/28/20 0515  ALBUMIN 1.9*     Recent Results (from the past 240 hour(s))  Acid Fast Smear (AFB)     Status: None   Collection Time: 12/25/20  4:30 PM   Specimen: PATH Cytology Pleural fluid  Result Value Ref Range Status   AFB Specimen Processing Concentration  Final   Acid Fast Smear Negative  Final    Comment: (NOTE) Performed At: [facility] [place], [place] [id number] Rush Farmer MD ES:[id number]    Source (AFB) PLEURAL  Final    Comment: Performed at [facility], [address] [place], [place] [address]  Body fluid culture w Gram Stain     Status: None   Collection Time: 12/25/20  4:30 PM   Specimen: PATH Cytology Pleural fluid  Result Value Ref Range Status  Specimen Description   Final    PLEURAL Performed at [facility], [address] [place], [place] [address]    Special Requests   Final    NONE Performed at [facility], [place], [place] [address]    Gram Stain   Final    RARE WBC PRESENT,BOTH PMN AND MONONUCLEAR NO ORGANISMS SEEN    Culture   Final    NO GROWTH 3 DAYS Performed at [facility], [place] [address], [place], [place] [address]    Report Status 12/29/2020 FINAL  Final  Body fluid culture w Gram Stain     Status: None   Collection Time: 12/25/20  4:49 PM   Specimen: BILE; Body Fluid  Result Value Ref Range Status   Specimen Description   Final    BILE Performed at [facility], [address] [place], [place] [address]    Special Requests   Final    NONE Performed at [facility], [address] [place], [place] [address]    Gram Stain   Final    RARE WBC PRESENT, PREDOMINANTLY MONONUCLEAR NO ORGANISMS SEEN    Culture   Final    NO GROWTH 3 DAYS Performed at [facility], [place] [address] [place], [place] [address]    Report Status 12/29/2020  FINAL  Final  Resp Panel by RT-PCR (Flu A&B, Covid) Nasopharyngeal Swab     Status: None   Collection Time: 01/02/21  7:46 AM   Specimen: Nasopharyngeal Swab; Nasopharyngeal(NP) swabs in vial transport medium  Result Value Ref Range Status   SARS Coronavirus 2 by RT PCR NEGATIVE NEGATIVE Final    Comment: (NOTE) SARS-CoV-2 target nucleic acids are NOT DETECTED.  The SARS-CoV-2 RNA is generally detectable in upper respiratory specimens during the acute phase of infection. The lowest concentration of SARS-CoV-2 viral copies this assay can detect is 138 copies/mL. A negative result does not preclude SARS-Cov-2 infection and should not be used as the sole basis for treatment or other patient management decisions. A negative result may occur with  improper specimen collection/handling, submission of specimen other than nasopharyngeal swab, presence of viral mutation(s) within the areas targeted by this assay, and inadequate number of viral copies(<138 copies/mL). A negative result must be combined with clinical observations, patient history, and epidemiological information. The expected result is Negative.  Fact Sheet for Patients:  EntrepreneurPulse.com.au  Fact Sheet for Healthcare Providers:  IncredibleEmployment.be  This test is no t yet approved or cleared by the [place] FDA and  has been authorized for detection and/or diagnosis of SARS-CoV-2 by FDA under an Emergency Use Authorization (EUA). This EUA will remain  in effect (meaning this test can be used) for the duration of the COVID-19 declaration under Section 564(b)(1) of the Act, 21 U.S.C.section 360bbb-3(b)(1), unless the authorization is terminated  or revoked sooner.       Influenza A by PCR NEGATIVE NEGATIVE Final   Influenza B by PCR NEGATIVE NEGATIVE Final    Comment: (NOTE) The Xpert Xpress SARS-CoV-2/FLU/RSV plus assay is intended as an aid in the diagnosis of influenza  from Nasopharyngeal swab specimens and should not be used as a sole basis for treatment. Nasal washings and aspirates are unacceptable for Xpert Xpress SARS-CoV-2/FLU/RSV testing.  Fact Sheet for Patients: EntrepreneurPulse.com.au  Fact Sheet for Healthcare Providers: IncredibleEmployment.be  This test is not yet approved or cleared by the [place] FDA and has been authorized for detection  and/or diagnosis of SARS-CoV-2 by FDA under an Emergency Use Authorization (EUA). This EUA will remain in effect (meaning this test can be used) for the duration of the COVID-19 declaration under Section 564(b)(1) of the Act, 21 U.S.C. section 360bbb-3(b)(1), unless the authorization is terminated or revoked.  Performed at [facility], [address] [place], [place] [address]          Radiology Studies: No results found.      Scheduled Meds:  apixaban  5 mg Oral BID   chlorhexidine  10 mL Mouth/Throat BID   Chlorhexidine Gluconate Cloth  6 each Topical Daily   diltiazem  240 mg Oral Daily   dronabinol  5 mg Oral BID AC   FLUoxetine  10 mg Oral Daily   folic acid  1 mg Oral Daily   furosemide  20 mg Oral Daily   guaiFENesin  1,200 mg Oral BID   mirtazapine  7.5 mg Oral QHS   multivitamin with minerals  1 tablet Oral Daily   sodium chloride flush  10-40 mL Intracatheter Q12H   sodium chloride flush  5 mL Intracatheter Q8H   spironolactone  50 mg Oral Daily   thiamine  100 mg Oral Daily   Continuous Infusions:  sodium chloride Stopped (12/25/20 1558)   sodium chloride 10 mL/hr (12/20/20 1220)     LOS: 17 days    Bonnielee Haff, MD [facility] 01/03/2021, 11:16 AM     Please page though AMION or Epic secure chat:  For [employer], Adult nurse

## 2021-01-03 NOTE — TOC Progression Note (Signed)
Transition of Care [facility]) - Progression Note    Patient Details  Name: Scott Jordan MRN: [id number] Date of Birth: 07-03-1950  [facility]) CM/SW Contact  Izola Price, RN Phone Number: 01/03/2021, 2:43 PM  Clinical Narrative:   Santiago Glad from Hospice contacted this RN CM to clarify consult. Explained situation from yesterday and gave her niece contact number as POA to connect to discuss situation directly. Will send consult notes from providers and palliative consult notes from Altha Harm NP to medical director.  Will let RN CM know something today in terms of evaluation status and will speak to niece to let her know the process. Simmie Davies RN CM     Expected Discharge Plan: Skilled Nursing Facility Barriers to Discharge: Barriers Resolved  Expected Discharge Plan and Services Expected Discharge Plan: [place] In-house Referral: Clinical Social Work   Post Acute Care Choice: [place] Living arrangements for the past 2 months: [place] Expected Discharge Date: 01/02/21               DME Arranged: N/A DME Agency: NA       HH Arranged: NA HH Agency: NA         Social Determinants of Health (SDOH) Interventions    Readmission Risk Interventions No flowsheet data found.

## 2021-01-04 DIAGNOSIS — C3490 Malignant neoplasm of unspecified part of unspecified bronchus or lung: Secondary | ICD-10-CM

## 2021-01-04 LAB — CBC
HCT: 23.1 % — ABNORMAL LOW (ref 39.0–52.0)
Hemoglobin: 8 g/dL — ABNORMAL LOW (ref 13.0–17.0)
MCH: 34 pg (ref 26.0–34.0)
MCHC: 34.6 g/dL (ref 30.0–36.0)
MCV: 98.3 fL (ref 80.0–100.0)
Platelets: 429 10*3/uL — ABNORMAL HIGH (ref 150–400)
RBC: 2.35 MIL/uL — ABNORMAL LOW (ref 4.22–5.81)
RDW: 13.4 % (ref 11.5–15.5)
WBC: 10.6 10*3/uL — ABNORMAL HIGH (ref 4.0–10.5)
nRBC: 0 % (ref 0.0–0.2)

## 2021-01-04 LAB — BASIC METABOLIC PANEL
Anion gap: 5 (ref 5–15)
BUN: 8 mg/dL (ref 8–23)
CO2: 26 mmol/L (ref 22–32)
Calcium: 8.1 mg/dL — ABNORMAL LOW (ref 8.9–10.3)
Chloride: 105 mmol/L (ref 98–111)
Creatinine, Ser: 0.62 mg/dL (ref 0.61–1.24)
GFR, Estimated: >60 mL/min (ref >60)
Glucose, Bld: 104 mg/dL — ABNORMAL HIGH (ref 70–99)
Potassium: 3.5 mmol/L (ref 3.5–5.1)
Sodium: 136 mmol/L (ref 135–145)

## 2021-01-04 MED ORDER — POTASSIUM CHLORIDE CRYS ER 20 MEQ PO TBCR
40.0000 meq | EXTENDED_RELEASE_TABLET | Freq: Once | ORAL | Status: AC
  Administered 2021-01-04: 40 meq via ORAL
  Filled 2021-01-04: qty 2

## 2021-01-04 NOTE — TOC Progression Note (Signed)
Transition of Care [facility]) - Progression Note    Patient Details  Name: Scott Jordan MRN: [id number] Date of Birth: [birth year]  Transition [facility] Care [facility]) CM/SW Contact  Beverly Sessions, RN Phone Number: 01/04/2021, 1:26 PM  Clinical Narrative:     Patient to discharge today hospice house Hospice liaison has arranged discharge and transportation EMS packet printed and DNR on chart    Expected Discharge Plan: [place] Barriers to Discharge: Barriers Resolved  Expected Discharge Plan and Services Expected Discharge Plan: [place] In-house Referral: Clinical Social Work   Post Acute Care Choice: [place] Living arrangements for the past 2 months: [place] Expected Discharge Date: 01/04/21               DME Arranged: N/A DME Agency: NA       HH Arranged: NA HH Agency: NA         Social Determinants of Health (SDOH) Interventions    Readmission Risk Interventions No flowsheet data found.

## 2021-01-04 NOTE — Discharge Summary (Signed)
[facility]  Physician Discharge Summary   Patient ID: Scott Jordan MRN: [id number] DOB/AGE: Feb 16, 1951 70 y.o.  Admit date: 12/17/2020 Discharge date:   01/04/2021   PCP: Earlie Server, MD  DISCHARGE DIAGNOSES:  Stage IV small cell lung cancer with metastases to bone Chemotherapy related pancytopenia Severe sepsis due to Klebsiella pneumonia bacteremia Acute cholecystitis status post percutaneous drainage Bilateral acute embolic stroke Paroxysmal atrial fibrillation Pleural effusion Normocytic anemia   RECOMMENDATIONS FOR OUTPATIENT FOLLOW UP: Follow-up with oncology, Dr. Tasia Catchings in 1 to 2 weeks depending on patient's clinical progress Please call interventional radiology for any questions regarding the cholecystostomy drain tube Follow-up with [facility] neurology for acute stroke in 4 to 6 weeks depending on clinical progress   Home Health: Patient going to residential hospice Equipment/Devices: None  CODE STATUS: DNR  DISCHARGE CONDITION: fair  Diet recommendation: Dysphagia 3 diet with thin liquids  INITIAL HISTORY: Scott Jordan is a 70 y.o. M with stage IV small cell lung cancer on chemo, A. fib not on anticoagulation, chronic hep C, HTN, history of alcohol use and polysubstance abuse who presented with nausea and vomiting. In the ER, he was confused.  WBC 200, platelets 7K, RR 43.  CT abdomen showed distended gallbladder with stones.  Admitted and started on antibiotics. 9/11: IR Perc drain placed in GB 9/11: Developed acute right arm weakness, MRI brain confirms embolic stroke 2/20: noted to have large pleural effusion, thoracentesis done, no malignant cells   Consultants:  Oncology Palliative Care Pulmonology Neurology Infectious disease Cardiology   Procedures:  9/11-PERC drain into gallbladder 9/16 left-sided thoracentesis   Antimicrobials:  Rocephin 9/11-9/18 Flagyl 9/12-9/17   HOSPITAL COURSE:   Severe sepsis due to Klebsiella pneumoniae bacteremia  due to cholecystitis Patient with tachypneic, leukopenia and confusion at admission.  Source: gallbladder.  Patient admitted and underwent percutaneous drain on 9/11.   Completed 9 days Flagyl and 10 days IV Rocephin per ID.   Remains afebrile.  Appetite remains poor.     Stage IV small cell lung cancer with metastasis to bone/Chemotherapy related pancytopenia Thrombocytopenia and leukopenia resolved from admission. The patient desires further chemotherapeutic treatments.  Because of his malnutrition, new CVAs, he is too weak at present to continue therapy.  If he were to strengthen, improve his appetite/oral intake, and start treatment for depressed mood, Oncology feel he would be able to resume life-prolonging treatments.   Initially it appears that the plan was for him to go to skilled nursing facility for rehab.  However subsequently based on patient's power of attorney is meeting with palliative care she and the patient agreed for him to enter residential hospice.  Further discussions were held with patient this morning and he is now willing to go to hospice.   New bilateral embolic CVA Developed new onset right arm weakness during this hospitalization and MRI brain was performed that showed multifocal acute embolic appearing strokes in the bilateral cerebral hemispheres.  Patient was seen by neurology.  Plan is to continue apixaban.  No TEE given concern for aspiration.  No statin due to LDL of 37.  Outpatient neurology follow-up. Neurological status noted to be stable.  Paroxysmal atrial fibrillation with RVR Stable.  Continue diltiazem.  Continue apixaban.     Left pleural effusion Underwent thoracentesis on 9/16, the symptoms resolved. Continue Lasix and spironolactone.   Acute kidney injury Resolved   Normocytic anemia Likely due to chronic disease.  No evidence of overt bleeding.  Polysubstance use disorder  Hyponatremia Resolved  Hypokalemia Hypomagnesemia Resolved    Urinary retention Transient, foley removed.   Severe protein calorie malnutrition - Continue dronabinol - Stop Megace - Start mirtazapine  Depression - Patient was started on Prozac   Pressure injury to right and left heel Pressure Injury 12/18/20 Heel Right Deep Tissue Pressure Injury - Purple or maroon localized area of discolored intact skin or blood-filled blister due to damage of underlying soft tissue from pressure and/or shear. (Active)  12/18/20 2200  Location: Heel  Location Orientation: Right  Staging: Deep Tissue Pressure Injury - Purple or maroon localized area of discolored intact skin or blood-filled blister due to damage of underlying soft tissue from pressure and/or shear.  Wound Description (Comments):   Present on Admission:      Pressure Injury 12/18/20 Heel Left Deep Tissue Pressure Injury - Purple or maroon localized area of discolored intact skin or blood-filled blister due to damage of underlying soft tissue from pressure and/or shear. (Active)  12/18/20 2200  Location: Heel  Location Orientation: Left  Staging: Deep Tissue Pressure Injury - Purple or maroon localized area of discolored intact skin or blood-filled blister due to damage of underlying soft tissue from pressure and/or shear.  Wound Description (Comments):   Present on Admission:     Severe protein calorie malnutrition Nutrition Problem: Severe Malnutrition Etiology: cancer and cancer related treatments  Signs/Symptoms: severe fat depletion, severe muscle depletion  Interventions: Prostat, MVI, Ensure Enlive (each supplement provides 350kcal and 20 grams of protein)  Patient being discharged to residential hospice.   PERTINENT LABS:  The results of significant diagnostics from this hospitalization (including imaging, microbiology, ancillary and laboratory) are listed below for reference.    Microbiology: Recent Results (from the past 240 hour(s))  Acid Fast Smear (AFB)     Status:  None   Collection Time: 12/25/20  4:30 PM   Specimen: PATH Cytology Pleural fluid  Result Value Ref Range Status   AFB Specimen Processing Concentration  Final   Acid Fast Smear Negative  Final    Comment: (NOTE) Performed At: [facility] [place], [place] [id number] Rush Farmer MD YC:[id number]    Source (AFB) PLEURAL  Final    Comment: Performed at [facility], [address] [place], [place] [address]  Body fluid culture w Gram Stain     Status: None   Collection Time: 12/25/20  4:30 PM   Specimen: PATH Cytology Pleural fluid  Result Value Ref Range Status   Specimen Description   Final    PLEURAL Performed at [facility], [address], [place], [place] [address]    Special Requests   Final    NONE Performed at [facility], [address] [place], [place] [address]    Gram Stain   Final    RARE WBC PRESENT,BOTH PMN AND MONONUCLEAR NO ORGANISMS SEEN    Culture   Final    NO GROWTH 3 DAYS Performed at [facility], [place] [address], [place], [place] [address]    Report Status 12/29/2020 FINAL  Final  Body fluid culture w Gram Stain     Status: None   Collection Time: 12/25/20  4:49 PM   Specimen: BILE; Body Fluid  Result Value Ref Range Status   Specimen Description   Final    BILE Performed at [facility], [address] [place], [place] [address]    Special Requests   Final    NONE Performed at [facility],  [place], [place] [address]    Gram Stain   Final    RARE WBC PRESENT, PREDOMINANTLY MONONUCLEAR NO ORGANISMS SEEN    Culture   Final    NO GROWTH 3 DAYS Performed at [facility] [address], [place], [place] [address]    Report Status 12/29/2020 FINAL  Final  Resp Panel by RT-PCR (Flu A&B, Covid) Nasopharyngeal Swab     Status: None   Collection Time: 01/02/21  7:46 AM   Specimen: Nasopharyngeal Swab; Nasopharyngeal(NP) swabs in vial  transport medium  Result Value Ref Range Status   SARS Coronavirus 2 by RT PCR NEGATIVE NEGATIVE Final    Comment: (NOTE) SARS-CoV-2 target nucleic acids are NOT DETECTED.  The SARS-CoV-2 RNA is generally detectable in upper respiratory specimens during the acute phase of infection. The lowest concentration of SARS-CoV-2 viral copies this assay can detect is 138 copies/mL. A negative result does not preclude SARS-Cov-2 infection and should not be used as the sole basis for treatment or other patient management decisions. A negative result may occur with  improper specimen collection/handling, submission of specimen other than nasopharyngeal swab, presence of viral mutation(s) within the areas targeted by this assay, and inadequate number of viral copies(<138 copies/mL). A negative result must be combined with clinical observations, patient history, and epidemiological information. The expected result is Negative.  Fact Sheet for Patients:  EntrepreneurPulse.com.au  Fact Sheet for Healthcare Providers:  IncredibleEmployment.be  This test is no t yet approved or cleared by the [place] FDA and  has been authorized for detection and/or diagnosis of SARS-CoV-2 by FDA under an Emergency Use Authorization (EUA). This EUA will remain  in effect (meaning this test can be used) for the duration of the COVID-19 declaration under Section 564(b)(1) of the Act, 21 U.S.C.section 360bbb-3(b)(1), unless the authorization is terminated  or revoked sooner.       Influenza A by PCR NEGATIVE NEGATIVE Final   Influenza B by PCR NEGATIVE NEGATIVE Final    Comment: (NOTE) The Xpert Xpress SARS-CoV-2/FLU/RSV plus assay is intended as an aid in the diagnosis of influenza from Nasopharyngeal swab specimens and should not be used as a sole basis for treatment. Nasal washings and aspirates are unacceptable for Xpert Xpress SARS-CoV-2/FLU/RSV testing.  Fact  Sheet for Patients: EntrepreneurPulse.com.au  Fact Sheet for Healthcare Providers: IncredibleEmployment.be  This test is not yet approved or cleared by the [place] FDA and has been authorized for detection and/or diagnosis of SARS-CoV-2 by FDA under an Emergency Use Authorization (EUA). This EUA will remain in effect (meaning this test can be used) for the duration of the COVID-19 declaration under Section 564(b)(1) of the Act, 21 U.S.C. section 360bbb-3(b)(1), unless the authorization is terminated or revoked.  Performed at [facility], [address] [place], [place] [address]      Labs:  COVID-19 Labs  Lab Results  Component Value Date   [place] 01/02/2021   [place] NEGATIVE 12/17/2020   [place] NEGATIVE 11/23/2019   [place] NEGATIVE 08/27/2019      Basic Metabolic Panel: Recent Labs  Lab 01/04/21 0421  NA 136  K 3.5  CL 105  CO2 26  GLUCOSE 104*  BUN 8  CREATININE 0.62  CALCIUM 8.1*    CBC: Recent Labs  Lab 12/30/20 0215 12/31/20 0534 01/01/21 0520 01/02/21 0457 01/04/21 0421  WBC 14.7* 12.4* 11.7* 8.7 10.6*  HGB 8.1* 7.8* 8.0* 9.4* 8.0*  HCT 23.6* 22.8* 23.0* 28.2* 23.1*  MCV 97.9 96.2 96.2 95.6 98.3  PLT 539* 549* 536* 465* 429*   BNP: BNP (last 3 results) Recent Labs    12/27/20 1540  BNP 436.0*       IMAGING STUDIES CT ABDOMEN PELVIS WO CONTRAST  Result Date: 12/17/2020 CLINICAL DATA:  Nausea and vomiting EXAM: CT ABDOMEN AND PELVIS WITHOUT CONTRAST TECHNIQUE: Multidetector CT imaging of the abdomen and pelvis was performed following the standard protocol without IV contrast. COMPARISON:  CT 06/23/2020, PET CT 11/19/2020 FINDINGS: Lower chest: Lung bases demonstrate emphysema. Subpleural bandlike airspace disease at the left lower lobe redemonstrated. Incompletely visualized subpleural peripheral left upper lobe nodule measuring 11 mm, series 5, image 1.  Cardiomegaly with trace pericardial effusion. Coronary vascular calcification. Hepatobiliary: Extensive motion degradation. No focal hepatic abnormality. Gallstones. Gallbladder slightly distended. Possible wall thickening or pericholecystic fluid though limited by motion Pancreas: Unremarkable. No pancreatic ductal dilatation or surrounding inflammatory changes. Spleen: Normal in size without focal abnormality. Adrenals/Urinary Tract: Thickened appearance of adrenal glands. No hydronephrosis. The bladder is unremarkable Stomach/Bowel: Stomach nonenlarged. No dilated small bowel. Negative appendix. No acute bowel wall thickening Vascular/Lymphatic: Advanced aortic atherosclerosis. No aneurysm. Slightly increased multiple small retroperitoneal lymph nodes. Reproductive: Prostate is unremarkable. Other: Negative for free air or free fluid Musculoskeletal: No acute osseous abnormality. IMPRESSION: 1. Degraded by motion. 2. Gallbladder appears slightly distended and there are gallstones. Possible gallbladder wall thickening or pericholecystic fluid though limited by motion, consider correlation with ultrasound 3. Cardiomegaly with trace pericardial effusion. 4. Similar subpleural bandlike density at left lower lobe. Incompletely visualized peripheral left upper lobe lung nodule characterized on recent PET CT Electronically Signed   By: Donavan Foil M.D.   On: 12/17/2020 16:50   CT ANGIO HEAD NECK W WO CM  Result Date: 12/21/2020 CLINICAL DATA:  Neuro deficit, acute, stroke suspected. Neutropenic sepsis. EXAM: CT ANGIOGRAPHY HEAD AND NECK TECHNIQUE: Multidetector CT imaging of the head and neck was performed using the standard protocol during bolus administration of intravenous contrast. Multiplanar CT image reconstructions and MIPs were obtained to evaluate the vascular anatomy. Carotid stenosis measurements (when applicable) are obtained utilizing NASCET criteria, using the distal internal carotid diameter as the  denominator. CONTRAST:  71m OMNIPAQUE IOHEXOL 350 MG/ML SOLN COMPARISON:  MRI of the brain December 20, 2020. FINDINGS: CT HEAD FINDINGS Brain: Confluent hypodensity of the white matter of the cerebral hemispheres, nonspecific, most likely related to chronic small vessel ischemia. Foci of acute infarct are more conspicuous on recent MRI of the brain. No new large territorial infarct. No hemorrhage, hydrocephalus, mass or midline shift. Vascular: No hyperdense vessel or unexpected calcification. Skull: Normal. Negative for fracture or focal lesion. Sinuses: Increased thickness of the walls of the bilateral maxillary sinuses with bubbly secretion on the left. Findings are suggestive of chronic sinusitis. Right mastoid effusion. Orbits: No acute finding. Review of the MIP images confirms the above findings CTA NECK FINDINGS Aortic arch: Standard branching. Imaged portion shows no evidence of aneurysm or dissection. Mild atherosclerotic changes of the aortic arch without significant stenosis of the major arch vessel origins. Right carotid system: Calcified atherosclerotic disease of the right carotid bifurcation without hemodynamically significant stenosis. Left carotid system: Atherosclerotic disease of the left carotid bifurcation with mixed density plaques without hemodynamically significant stenosis. Vertebral arteries: Calcified atherosclerotic plaque at the origin of the right vertebral artery without hemodynamically significant stenosis. Mild atherosclerotic changes at the origin of the left vertebral artery without hemodynamically significant stenosis. Cervical vertebral arteries otherwise have normal caliber  Skeleton: Degenerative changes of the cervical spine with fusion of the facet joints at C2-3 on the left. No acute findings. Other neck: Negative. Upper chest: Left-sided pleural effusion and atelectasis. Mediastinal lymphadenopathy. Review of the MIP images confirms the above findings CTA HEAD FINDINGS  Anterior circulation: Calcified atherosclerotic plaques in the bilateral carotid siphons. No significant stenosis, proximal occlusion, aneurysm, or vascular malformation. Posterior circulation: No significant stenosis, proximal occlusion, aneurysm, or vascular malformation. Hypoplastic/aplastic right P1/PCA segment with right fetal PCA. Venous sinuses: As permitted by contrast timing, patent. Anatomic variants: Right fetal PCA. Review of the MIP images confirms the above findings IMPRESSION: 1. Advanced chronic microvascular ischemic changes of the white matter. 2. Atherosclerotic disease of the bilateral carotid bifurcation without hemodynamically significant stenosis. 3. Mild atherosclerotic changes at the origin of the bilateral vertebral arteries without hemodynamically significant stenosis. 4. Mild intracranial atherosclerotic disease without hemodynamically significant stenosis. Electronically Signed   By: Pedro Earls M.D.   On: 12/21/2020 15:32   DG Chest 1 View  Result Date: 12/25/2020 CLINICAL DATA:  Thoracentesis.  Lung cancer EXAM: CHEST  1 VIEW COMPARISON:  Radiograph 12/25/2020 FINDINGS: Interval reduction in LEFT pleural fluid following thoracentesis. No pneumothorax appreciated. A small basilar effusion remains. RIGHT lung clear. Port in the anterior chest wall with tip in distal SVC. IMPRESSION: 1. No pneumothorax appreciated following LEFT thoracentesis. 2. Considerable reduction in LEFT pleural fluid. Electronically Signed   By: Suzy Bouchard M.D.   On: 12/25/2020 17:28   DG Chest 2 View  Result Date: 12/27/2020 CLINICAL DATA:  Status post thoracentesis 2 days prior. EXAM: CHEST - 2 VIEW COMPARISON:  Radiograph same day (12/27/2020 at 830 hours FINDINGS: Persistent near complete opacification of the LEFT hemithorax. Small amount of aerated lung remains at LEFT lung apex. Port in place. RIGHT lung clear. IMPRESSION: No interval change in near complete opacification LEFT  hemithorax. Electronically Signed   By: Suzy Bouchard M.D.   On: 12/27/2020 18:17   CT HEAD WO CONTRAST (5MM)  Result Date: 12/17/2020 CLINICAL DATA:  Nausea vomiting EXAM: CT HEAD WITHOUT CONTRAST TECHNIQUE: Contiguous axial images were obtained from the base of the skull through the vertex without intravenous contrast. COMPARISON:  CT brain 06/24/2020, MRI 03/10/2020, head CT 11/22/2019 FINDINGS: Brain: No acute territorial infarction, hemorrhage or intracranial mass. Extensive white matter disease, progressed from more remote exams from 2021. Stable ventricle size. Chronic lacunar infarcts within the left white matter. Vascular: No hyperdense vessel.  Carotid vascular calcification Skull: Normal. Negative for fracture or focal lesion. Sinuses/Orbits: Mucosal thickening the sinuses. Chronic appearing nasal deformity Other: None IMPRESSION: 1. No definite CT evidence for acute intracranial abnormality. 2. Atrophy. Extensive white matter disease, grossly stable as compared with recent head CT from March, appears progressive when compared to exams from 2021 and prior Electronically Signed   By: Donavan Foil M.D.   On: 12/17/2020 16:39   MR BRAIN W WO CONTRAST  Addendum Date: 12/21/2020   ADDENDUM REPORT: 12/21/2020 14:49 ADDENDUM: Patient returns for repeat postcontrast imaging. There is no abnormal enhancement. No evidence of intracranial metastatic disease. Electronically Signed   By: Macy Mis M.D.   On: 12/21/2020 14:49   Result Date: 12/21/2020 CLINICAL DATA:  Neuro deficit, acute, stroke suspected Small cell lung cancer, monitor EXAM: MRI HEAD WITHOUT AND WITH CONTRAST TECHNIQUE: Multiplanar, multiecho pulse sequences of the brain and surrounding structures were obtained without and with intravenous contrast. CONTRAST:  7.63m GADAVIST GADOBUTROL 1 MMOL/ML IV SOLN COMPARISON:  CT head 12/17/2020. FINDINGS: Mildly motion limited exam.  Within this limitation: Brain: Multiple small areas of  restricted diffusion in the bilateral frontal and occipital, and left parietal matter, left perirolandic cortex, left thalamocapsular region, and left cerebellum. Advanced confluent T2 hyperintensity within the white matter, nonspecific but compatible with chronic microvascular ischemic disease and progressed since 2021. Small remote infarct in the left corona radiata. No hydrocephalus, obvious mass lesion, midline shift, acute hemorrhage, or extra-axial fluid collection. Punctate foci of susceptibility artifact in the left cerebellum and left frontal white matter, nonspecific but potentially related to prior microhemorrhage. Contrast was administered; however, there is no evidence of enhancement (for example, the nasal mucosa/turbinates are nonenhancing). This precludes evaluation for enhancing lesions. Vascular: Major arterial flow voids are maintained at the skull base. Skull and upper cervical spine: Normal marrow signal. Sinuses/Orbits: Mild paranasal sinus disease. Other: Moderate right and small left mastoid effusions. IMPRESSION: 1. Multiple small areas of restricted diffusion in the bilateral frontal and occipital and left parietal white matter, left perirolandic cortex, left thalamocapsular region, and left cerebellum, which are concerning for acute infarcts. Given involvement of multiple vascular territories, consider a central embolic etiology. 2. Contrast was administered; however, there is no evidence of enhancement, unclear why given reported uneventful injection per the technologist. This precludes evaluation for enhancing lesions. Given the patient's known small cell lung cancer diagnosis, recommend repeat postcontrast imaging to exclude metastatic disease. I've asked the MRI technologist to not bill the patient for the repeat post-contrast imaging. 3. Advanced chronic microvascular ischemic disease, progressed since 2021. 4. Moderate right and small left mastoid effusions. Electronically Signed:  By: Margaretha Sheffield M.D. On: 12/20/2020 14:29   IR Perc Cholecystostomy  Result Date: 12/20/2020 INDICATION: 70 year old male with small cell lung cancer and chemotherapy induced pancytopenia presenting with neutropenic sepsis. Blood cultures grew out Klebsiella and imaging findings are concerning for acute cholecystitis. He is too sick and frail to be considered an operative candidate. Following transfusion of platelets, his platelets are now at an acceptable level to proceed with percutaneous cholecystostomy tube placement. EXAM: CHOLECYSTOSTOMY MEDICATIONS: In patient currently receiving intravenous antibiotic therapy. No additional antibiotic prophylaxis administered. ANESTHESIA/SEDATION: Moderate (conscious) sedation was employed during this procedure. A total of Versed 1 mg and Fentanyl 50 mcg and 0.5 mg Dilaudid administered intravenously. Moderate Sedation Time: 13 minutes. The patient's level of consciousness and vital signs were monitored continuously by radiology nursing throughout the procedure under my direct supervision. FLUOROSCOPY TIME:  Fluoroscopy Time: 0 minutes 30 seconds (4.3 mGy). COMPLICATIONS: None immediate. PROCEDURE: Informed written consent was obtained from the patient after a thorough discussion of the procedural risks, benefits and alternatives. All questions were addressed. Maximal Sterile Barrier Technique was utilized including caps, mask, sterile gowns, sterile gloves, sterile drape, hand hygiene and skin antiseptic. A timeout was performed prior to the initiation of the procedure. The right upper quadrant was interrogated with ultrasound. The distended gallbladder with a thickened wall was successfully identified. There is significant excursion of the liver with respiration due to use of abdominal muscles. A suitable skin entry site was selected. Local anesthesia was attained by infiltration with 1% lidocaine. A small dermatotomy was made. Under real-time ultrasound  guidance, the movement of the liver was timed with respirations and in a single pass, a 21 gauge Accustick needle was advanced through the liver and into the gallbladder lumen along a short transhepatic course. A 0.018 wire was quickly advanced into the gallbladder lumen and the needle was exchanged for  the transitional dilator. The transitional dilator was advanced into the gallbladder lumen. The wire was removed. Contrast injection was performed under fluoroscopy confirming opacification of the gallbladder lumen. Multiple filling defects consistent with cholelithiasis. The cystic duct is occluded. A 0.035 wire was then coiled within the gallbladder lumen. The transitional dilator was removed. The transhepatic tract was dilated to 10 [place] with a stiff dilator. A Cook 10.2 [place] all-purpose drainage catheter was then advanced over the wire and formed in the gallbladder lumen. Images were obtained and stored for the medical record. The catheter was gently flushed and connected to gravity bag drainage before being secured to the skin with 0 silk suture. IMPRESSION: Successful placement of 10 French transhepatic percutaneous cholecystostomy tube for an indication of calculus cholecystitis. PLAN: Maintain tube to gravity bag drainage. Patient will likely need home health to assist with care and cleaning of the tube site. Return to interventional radiology approximately every 8 weeks for cholecystostomy tube check and exchange. If patient's clinical status improves in the future, elective cholecystectomy would be optimal. If patient remains a poor operative candidate, long-term cholecystostomy tube maintenance will likely be required. Electronically Signed   By: Jacqulynn Cadet M.D.   On: 12/20/2020 13:50   DG Chest Port 1 View  Result Date: 12/27/2020 CLINICAL DATA:  Shortness of breath with atelectasis common thoracentesis 2 days ago EXAM: PORTABLE CHEST 1 VIEW COMPARISON:  Chest radiograph 12/25/2020  FINDINGS: The left chest wall port is in stable position with the tip terminating in the region of the right atrium. The cardiomediastinal silhouette can not be assessed. The left pleural effusion has increased in size with worsening aeration of the left upper lobe. Aeration of the right lung is not significantly changed. There is no significant right effusion. There is no appreciable pneumothorax. There is no acute osseous abnormality. Enteric contrast is noted in the colon. IMPRESSION: Increased size of the large left pleural effusion with worsened aeration of the left lung. Electronically Signed   By: Valetta Mole M.D.   On: 12/27/2020 09:12   DG Chest Port 1 View  Result Date: 12/25/2020 CLINICAL DATA:  Shortness of breath EXAM: PORTABLE CHEST 1 VIEW COMPARISON:  Chest x-ray 12/17/2020, PET CT 11/19/2020 FINDINGS: Left-sided central venous port tip over right atrium. Interval complete opacification of left thorax, suspect that there is at least moderate pleural effusion. Cardiomediastinal silhouette is obscured. No pneumothorax is visualized. IMPRESSION: Interval opacification of the entire left thorax which may be due to combination of pleural effusion/atelectasis/airspace disease. Electronically Signed   By: Donavan Foil M.D.   On: 12/25/2020 15:19   DG Chest Port 1 View  Result Date: 12/17/2020 CLINICAL DATA:  Questionable sepsis.  Weakness EXAM: PORTABLE CHEST 1 VIEW COMPARISON:  03/09/2020 FINDINGS: Left subclavian approached chest port remains in place. Stable cardiomegaly. Chronic left basilar scarring or atelectasis. Peripheral left upper lobe nodule better seen on prior CT. No new airspace consolidation. Pleural effusion or pneumothorax. IMPRESSION: Chronic left basilar scarring or atelectasis. No new or acute findings. Electronically Signed   By: Davina Poke D.O.   On: 12/17/2020 13:20   DG Swallowing Func-Speech Pathology  Result Date: 12/25/2020 Table formatting from the original  result was not included. Objective Swallowing Evaluation: Type of Study: MBS-Modified Barium Swallow Study  Patient Details Name: DORWIN FITZHENRY MRN: [id number] Date of Birth: 1950-11-13 Today's Date: 12/25/2020 Time: SLP Start Time (ACUTE ONLY): 1230 -SLP Stop Time (ACUTE ONLY): 1300 SLP Time Calculation (min) (ACUTE ONLY): 30 min  Past Medical History: Past Medical History: Diagnosis Date  A-fib ([place]) 01/10/2019  pt st this was dx by Dr. Ubaldo Glassing  Cancer [facility]) 1610  LUNG  Complication of anesthesia   DIFFICULTY WAKING UP AFTER SURGERY- 28 YRS AGO  Hypertension   Lung cancer ([place])   Substance abuse ([place])  Past Surgical History: Past Surgical History: Procedure Laterality Date  ARM DEBRIDEMENT Left   INCISION AND DEBRIDEMENT LOWER ARM -20 YRS AGO  BACK SURGERY    IR PERC CHOLECYSTOSTOMY  12/20/2020  PORTACATH PLACEMENT Left 08/29/2019  Procedure: INSERTION PORT-A-CATH;  Surgeon: Nestor Lewandowsky, MD;  Location: [facility] ORS;  Service: General;  Laterality: Left; HPI: Pt is a 70 year old male with PMH of 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 fib not on AC, chronic hep C, chronic cancer pain, HTN, alcohol abuse, malnutrition and polysubstance who is admitted with neutropenic sepsis due to acute cholecystitis and Klebsiella bacteremia now s/p cholecystostomy on 9/11 by IR. Hospital admission complicated by acute thromboembolic stroke with new right-sided weakness-confirmed on MRI brain on 12/20/2020.  MRI: Multiple small areas of restricted diffusion in the bilateral  frontal and occipital and left parietal white matter, left  perirolandic cortex, left thalamocapsular region, and left  cerebellum, which are concerning for acute infarcts.  CXR at admit: Chronic left basilar scarring or atelectasis. No new or acute  findings.  Pt is followed by [facility] at [facility].  Subjective: pt able to communicate his wants/needs Assessment / Plan / Recommendation CHL IP CLINICAL IMPRESSIONS  12/25/2020 Clinical Impression Pt presents with adequate airway protection while consuming thin liquids, nectar thick liquids, puree and whole barium tablet with thin liquids. Pt's oral phase is unremarkable and his swallow response is timely with good airway protection. His overall medical comorbidities and respiratory decline place him at an overall increased risk of aspiration. For example, when consuming consecutive sips of thin liquids, pt became short of breath. At this time, recommend pt continue consuming dysphagia 3 diet for energy conservation with thin liquids, medicine whole with thin liquids. Pt needs to follow strict aspiration precautions such as taking single sips and rest breaks. SLP Visit Diagnosis Dysphagia, oropharyngeal phase (R13.12) Attention and concentration deficit following -- Frontal lobe and executive function deficit following -- Impact on safety and function Mild aspiration risk;Moderate aspiration risk   CHL IP TREATMENT RECOMMENDATION 12/25/2020 Treatment Recommendations No treatment recommended at this time   Prognosis 12/24/2020 Prognosis for Safe Diet Advancement Fair Barriers to Reach Goals Motivation;Time post onset;Severity of deficits;Behavior Barriers/Prognosis Comment -- CHL IP DIET RECOMMENDATION 12/25/2020 SLP Diet Recommendations Dysphagia 3 (Mech soft) solids;Thin liquid Liquid Administration via Cup Medication Administration Whole meds with liquid Compensations Minimize environmental distractions;Slow rate;Small sips/bites Postural Changes Remain semi-upright after after feeds/meals (Comment);Seated upright at 90 degrees   CHL IP OTHER RECOMMENDATIONS 12/25/2020 Recommended Consults -- Oral Care Recommendations Oral care BID Other Recommendations --   CHL IP FOLLOW UP RECOMMENDATIONS 12/25/2020 Follow up Recommendations None   CHL IP FREQUENCY AND DURATION 12/24/2020 Speech Therapy Frequency (ACUTE ONLY) min 3x week Treatment Duration 2 weeks      CHL IP ORAL PHASE 12/25/2020  Oral Phase WFL Oral - Pudding Teaspoon -- Oral - Pudding Cup -- Oral - Honey Teaspoon -- Oral - Honey Cup -- Oral - Nectar Teaspoon -- Oral - Nectar Cup -- Oral - Nectar Straw -- Oral - Thin Teaspoon -- Oral - Thin Cup -- Oral - Thin Straw -- Oral - Puree --  Oral - Mech Soft -- Oral - Regular -- Oral - Multi-Consistency -- Oral - Pill -- Oral Phase - Comment --  CHL IP PHARYNGEAL PHASE 12/25/2020 Pharyngeal Phase WFL Pharyngeal- Pudding Teaspoon -- Pharyngeal -- Pharyngeal- Pudding Cup -- Pharyngeal -- Pharyngeal- Honey Teaspoon -- Pharyngeal -- Pharyngeal- Honey Cup -- Pharyngeal -- Pharyngeal- Nectar Teaspoon -- Pharyngeal -- Pharyngeal- Nectar Cup -- Pharyngeal -- Pharyngeal- Nectar Straw -- Pharyngeal -- Pharyngeal- Thin Teaspoon -- Pharyngeal -- Pharyngeal- Thin Cup -- Pharyngeal -- Pharyngeal- Thin Straw -- Pharyngeal -- Pharyngeal- Puree -- Pharyngeal -- Pharyngeal- Mechanical Soft -- Pharyngeal -- Pharyngeal- Regular -- Pharyngeal -- Pharyngeal- Multi-consistency -- Pharyngeal -- Pharyngeal- Pill -- Pharyngeal -- Pharyngeal Comment --  CHL IP CERVICAL ESOPHAGEAL PHASE 12/25/2020 Cervical Esophageal Phase WFL Pudding Teaspoon -- Pudding Cup -- Honey Teaspoon -- Honey Cup -- Nectar Teaspoon -- Nectar Cup -- Nectar Straw -- Thin Teaspoon -- Thin Cup -- Thin Straw -- Puree -- Mechanical Soft -- Regular -- Multi-consistency -- Pill -- Cervical Esophageal Comment -- Happi B. [name] M.S., CCC-SLP, [place] Office [phone] Stormy Fabian 12/25/2020, 2:17 PM              ECHOCARDIOGRAM COMPLETE  Result Date: 12/21/2020    ECHOCARDIOGRAM REPORT   Patient Name:   ZACCHARY POMPEI Date of Exam: 12/21/2020 Medical Rec #:  [id number]    Height:       72.0 in Accession #:    [id number]   Weight:       137.1 lb Date of Birth:  07/29/1950     BSA:          1.815 m Patient Age:    58 years     BP:           114/80 mmHg Patient Gender: M            HR:           89 bpm. Exam  Location:  [facility] Procedure: 2D Echo, Color Doppler and Cardiac Doppler Indications:     Endocarditis I38  History:         Patient has prior history of Echocardiogram examinations, most                  recent 08/19/2019. Arrythmias:Atrial Fibrillation; Risk                  Factors:Hypertension. Substance abuse.  Sonographer:     Sherrie Sport Referring Phys:  [place] Diagnosing Phys: Serafina Royals MD IMPRESSIONS  1. Left ventricular ejection fraction, by estimation, is 60 to 65%. The left ventricle has normal function. The left ventricle has no regional wall motion abnormalities. Left ventricular diastolic parameters were normal.  2. Right ventricular systolic function is normal. The right ventricular size is normal.  3. The mitral valve is normal in structure. Trivial mitral valve regurgitation.  4. The aortic valve is normal in structure. Aortic valve regurgitation is not visualized. Mild aortic valve sclerosis is present, with no evidence of aortic valve stenosis. Conclusion(s)/Recommendation(s): No evidence of valve vegetations. FINDINGS  Left Ventricle: Left ventricular ejection fraction, by estimation, is 60 to 65%. The left ventricle has normal function. The left ventricle has no regional wall motion abnormalities. The left ventricular internal cavity size was small. There is no left ventricular hypertrophy. Left ventricular diastolic parameters were normal. Right Ventricle: The right ventricular size is normal. No increase in right ventricular wall thickness. Right ventricular systolic function is normal.  Left Atrium: Left atrial size was normal in size. Right Atrium: Right atrial size was normal in size. Pericardium: There is no evidence of pericardial effusion. Mitral Valve: The mitral valve is normal in structure. Trivial mitral valve regurgitation. Tricuspid Valve: The tricuspid valve is normal in structure. Tricuspid valve regurgitation is trivial. Aortic Valve: The aortic valve is normal in  structure. Aortic valve regurgitation is not visualized. Mild aortic valve sclerosis is present, with no evidence of aortic valve stenosis. Aortic valve mean gradient measures 3.0 mmHg. Aortic valve peak gradient measures 5.9 mmHg. Aortic valve area, by VTI measures 2.30 cm. Pulmonic Valve: The pulmonic valve was normal in structure. Pulmonic valve regurgitation is not visualized. Aorta: The aortic root and ascending aorta are structurally normal, with no evidence of dilitation. IAS/Shunts: No atrial level shunt detected by color flow Doppler.  LEFT VENTRICLE PLAX 2D LVIDd:         3.87 cm LVIDs:         2.30 cm LV PW:         1.00 cm LV IVS:        1.18 cm LVOT diam:     2.10 cm LV SV:         44 LV SV Index:   24 LVOT Area:     3.46 cm  RIGHT VENTRICLE RV Basal diam:  3.70 cm RV S prime:     12.40 cm/s TAPSE (M-mode): 3.7 cm LEFT ATRIUM           Index       RIGHT ATRIUM           Index LA diam:      3.50 cm 1.93 cm/m  RA Area:     33.70 cm LA Vol (A2C): 57.4 ml 31.63 ml/m RA Volume:   128.00 ml 70.53 ml/m LA Vol (A4C): 72.1 ml 39.73 ml/m  AORTIC VALVE                   PULMONIC VALVE AV Area (Vmax):    2.34 cm    PV Vmax:        0.60 m/s AV Area (Vmean):   2.32 cm    PV Peak grad:   1.4 mmHg AV Area (VTI):     2.30 cm    RVOT Peak grad: 2 mmHg AV Vmax:           121.00 cm/s AV Vmean:          86.000 cm/s AV VTI:            0.193 m AV Peak Grad:      5.9 mmHg AV Mean Grad:      3.0 mmHg LVOT Vmax:         81.60 cm/s LVOT Vmean:        57.600 cm/s LVOT VTI:          0.128 m LVOT/AV VTI ratio: 0.66  AORTA Ao Root diam: 3.50 cm MITRAL VALVE               TRICUSPID VALVE MV Area (PHT): 4.52 cm    TR Peak grad:   24.4 mmHg MV Decel Time: 168 msec    TR Vmax:        247.00 cm/s MV E velocity: 88.30 cm/s                            SHUNTS  Systemic VTI:  0.13 m                            Systemic Diam: 2.10 cm Serafina Royals MD Electronically signed by Serafina Royals MD Signature  Date/Time: 12/21/2020/5:20:09 PM    Final    US Abdomen Limited RUQ (LIVER/GB)  Result Date: 12/18/2020 CLINICAL DATA:  Evaluate for cholecystitis. EXAM: ULTRASOUND ABDOMEN LIMITED RIGHT UPPER QUADRANT COMPARISON:  CT AP from 12/17/2020 FINDINGS: Gallbladder: The gallbladder wall is diffusely edematous measuring up to 13.1 mm. Gallstones are noted measuring up to 5.9 mm. Pericholecystic fluid. Positive sonographic Murphy's sign. Common bile duct: Diameter: 6 mm Liver: Diffusely increased parenchymal echogenicity identified. No focal liver abnormality. Portal vein is patent on color Doppler imaging with normal direction of blood flow towards the liver. Other: 1.9 cm right kidney cyst. IMPRESSION: Gallstones, gallbladder wall thickening and pericholecystic fluid with positive sonographic Murphy's sign. Findings are compatible with acute cholecystitis. Electronically Signed   By: Kerby Moors M.D.   On: 12/18/2020 14:18   US THORACENTESIS ASP PLEURAL SPACE W/IMG GUIDE  Result Date: 12/25/2020 INDICATION: Large left pleural effusion secondary to known lung cancer. Request for diagnostic and therapeutic thoracentesis. EXAM: ULTRASOUND GUIDED LEFT THORACENTESIS MEDICATIONS: 1% lidocaine 10 mL COMPLICATIONS: None immediate. PROCEDURE: An ultrasound guided thoracentesis was thoroughly discussed with the patient and questions answered. The benefits, risks, alternatives and complications were also discussed. The patient understands and wishes to proceed with the procedure. Written consent was obtained. Ultrasound was performed to localize and mark an adequate pocket of fluid in the left chest. The area was then prepped and draped in the normal sterile fashion. 1% Lidocaine was used for local anesthesia. Under ultrasound guidance a 6 Fr Safe-T-Centesis catheter was introduced. Thoracentesis was performed. The catheter was removed and a dressing applied. FINDINGS: A total of approximately 700 mL of hazy yellow fluid was  removed. Samples were sent to the laboratory as requested by the clinical team. IMPRESSION: Successful ultrasound guided left thoracentesis yielding 700 mL of pleural fluid. Read by: Gareth Eagle, PA-C Electronically Signed   By: Michaelle Birks M.D.   On: 12/25/2020 16:44    DISCHARGE EXAMINATION: Vitals:   01/03/21 0819 01/03/21 1550 01/04/21 0422 01/04/21 0745  BP: 132/86 115/76 115/72 126/84  Pulse: 97 93 87 99  Resp: _0 Temp: 98.9 F (37.2 C) 98.7 F (37.1 C) 98.3 F (36.8 C) 98.9 F (37.2 C)  TempSrc: Oral Oral Oral   SpO2: 96% 96% 97% 96%  Weight:      Height:       General appearance: Awake alert.  In no distress Resp: Clear to auscultation bilaterally.  Normal effort Cardio: S1-S2 is normal regular.  No S3-S4.  No rubs murmurs or bruit GI: Abdomen is soft.  Nontender nondistended.  Bowel sounds are present normal.  No masses organomegaly    DISPOSITION: Residential hospice  Discharge Instructions     Ambulatory referral to Neurology   Complete by: As directed    An appointment is requested in approximately: 6 wks   Diet general   Complete by: As directed    Increase activity slowly   Complete by: As directed    No wound care   Complete by: As directed          Allergies as of 01/04/2021   No Known Allergies      Medication List     STOP taking  these medications    lidocaine-prilocaine cream Commonly known as: EMLA   magnesium oxide 400 MG tablet Commonly known as: MAG-OX   Mavyret 100-40 MG Tabs Generic drug: Glecaprevir-Pibrentasvir   naloxone 4 MG/0.1ML Liqd nasal spray kit Commonly known as: NARCAN   ondansetron 8 MG tablet Commonly known as: Zofran   potassium chloride SA 20 MEQ tablet Commonly known as: KLOR-CON       TAKE these medications    diltiazem 240 MG 24 hr capsule Commonly known as: TIAZAC Take 1 capsule by mouth daily.   dronabinol 5 MG capsule Commonly known as: MARINOL Take 1 capsule (5 mg total) by  mouth 2 (two) times daily before lunch and supper.   Eliquis 5 MG Tabs tablet Generic drug: apixaban Take 5 mg by mouth 2 (two) times daily.   feeding supplement Liqd Take 237 mLs by mouth 3 (three) times daily between meals.   FLUoxetine 10 MG capsule Commonly known as: PROZAC Take 1 capsule (10 mg total) by mouth daily.   folic acid 1 MG tablet Commonly known as: FOLVITE Take 1 tablet (1 mg total) by mouth daily.   guaiFENesin 600 MG 12 hr tablet Commonly known as: MUCINEX Take 2 tablets (1,200 mg total) by mouth 2 (two) times daily.   mirtazapine 15 MG tablet Commonly known as: REMERON Take by mouth.   oxyCODONE-acetaminophen 5-325 MG tablet Commonly known as: PERCOCET/ROXICET Take 1 tablet by mouth every 6 (six) hours as needed for moderate pain or severe pain.   prochlorperazine 10 MG tablet Commonly known as: COMPAZINE Take 1 tablet (10 mg total) by mouth every 6 (six) hours as needed for nausea or vomiting.          Follow-up Information     Criselda Peaches, MD Follow up.   Specialties: Interventional Radiology, Radiology Why: Our clinic will call the patient. Contact information: [place] [address] [phone]         Earlie Server, MD. Schedule an appointment as soon as possible for a visit in 1 week(s).   Specialty: Oncology Contact information: [place] [address] [phone]         [facility]. Schedule an appointment as soon as possible for a visit in 1 month(s).   Specialty: Neurology Contact information: [address] [place] [phone]                TOTAL DISCHARGE TIME: 35 minutes  [facility] Pager on [email or web address]  01/04/2021, 9:22 AM

## 2021-01-04 NOTE — Progress Notes (Addendum)
[facility]) Hospital Liaison Note   Received request from Transitions of Care Manager Dominica Severin, RN for family interest in [place]. Spoke with Monna Fam, niece to confirm interest and explain services. Patient chart and information reviewed by [facility] physician. Hospice Home eligibility confirmed.    Family agreeable to transfer today. [facility] scheduled to transport patient to [place] at 11:30 am. Isaias Cowman, RN [facility] Manager aware.   RN please call report to [facility] at [phone] prior to patient leaving the unit.  Please send signed and completed DNR with patient at discharge.   Please do not hesitate to call with any hospice related questions.    Thank you for the opportunity to participate in this patient's care.   Bobbie "Loren Racer, RN, BSN [facility] Liaison [phone]

## 2021-01-08 ENCOUNTER — Other Ambulatory Visit: Payer: Self-pay | Admitting: Oncology

## 2021-01-12 LAB — MISCELLANEOUS TEST

## 2021-01-13 ENCOUNTER — Telehealth (HOSPITAL_COMMUNITY): Payer: Self-pay

## 2021-01-13 NOTE — Telephone Encounter (Signed)
Called to schedule drain exchange, no answer, vm full. AW

## 2021-02-03 ENCOUNTER — Ambulatory Visit: Payer: Medicare Other | Admitting: Radiation Oncology

## 2021-02-09 LAB — ACID FAST CULTURE WITH REFLEXED SENSITIVITIES (MYCOBACTERIA): Acid Fast Culture: NEGATIVE

## 2021-02-09 DEATH — deceased

## 2021-09-06 IMAGING — CR DG CHEST 2V
1 series · 2 of 2 positions shown · non-contrast
Comparison: 08/02/2019

CLINICAL DATA: Short of breath, recent diagnosis of pneumonia,
recent thoracentesis

EXAM:
CHEST - 2 VIEW

[Series 1: dg chest 2 view · 0.14mm/px · 2 of 2 slices shown]
[im 1/2]
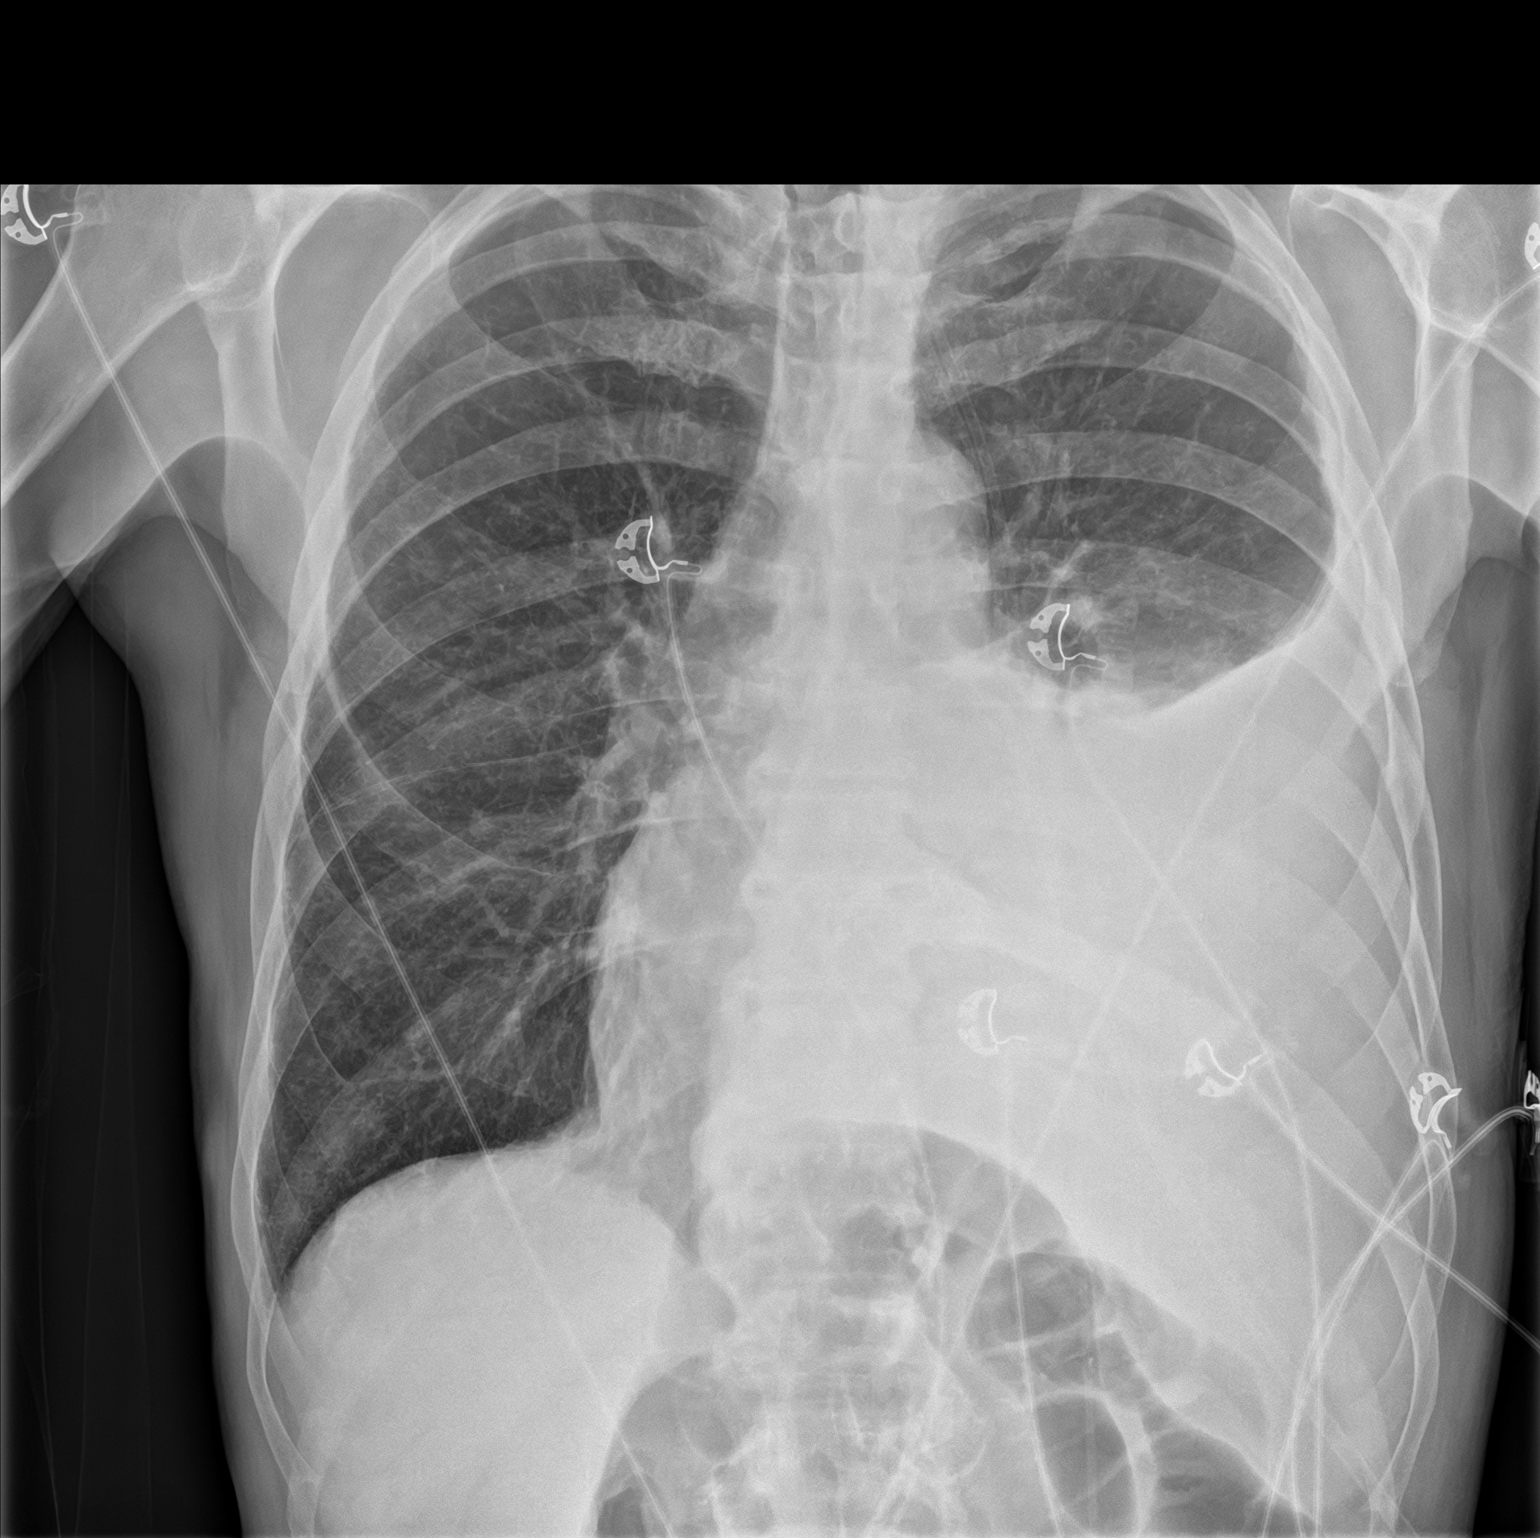
[im 2/2]
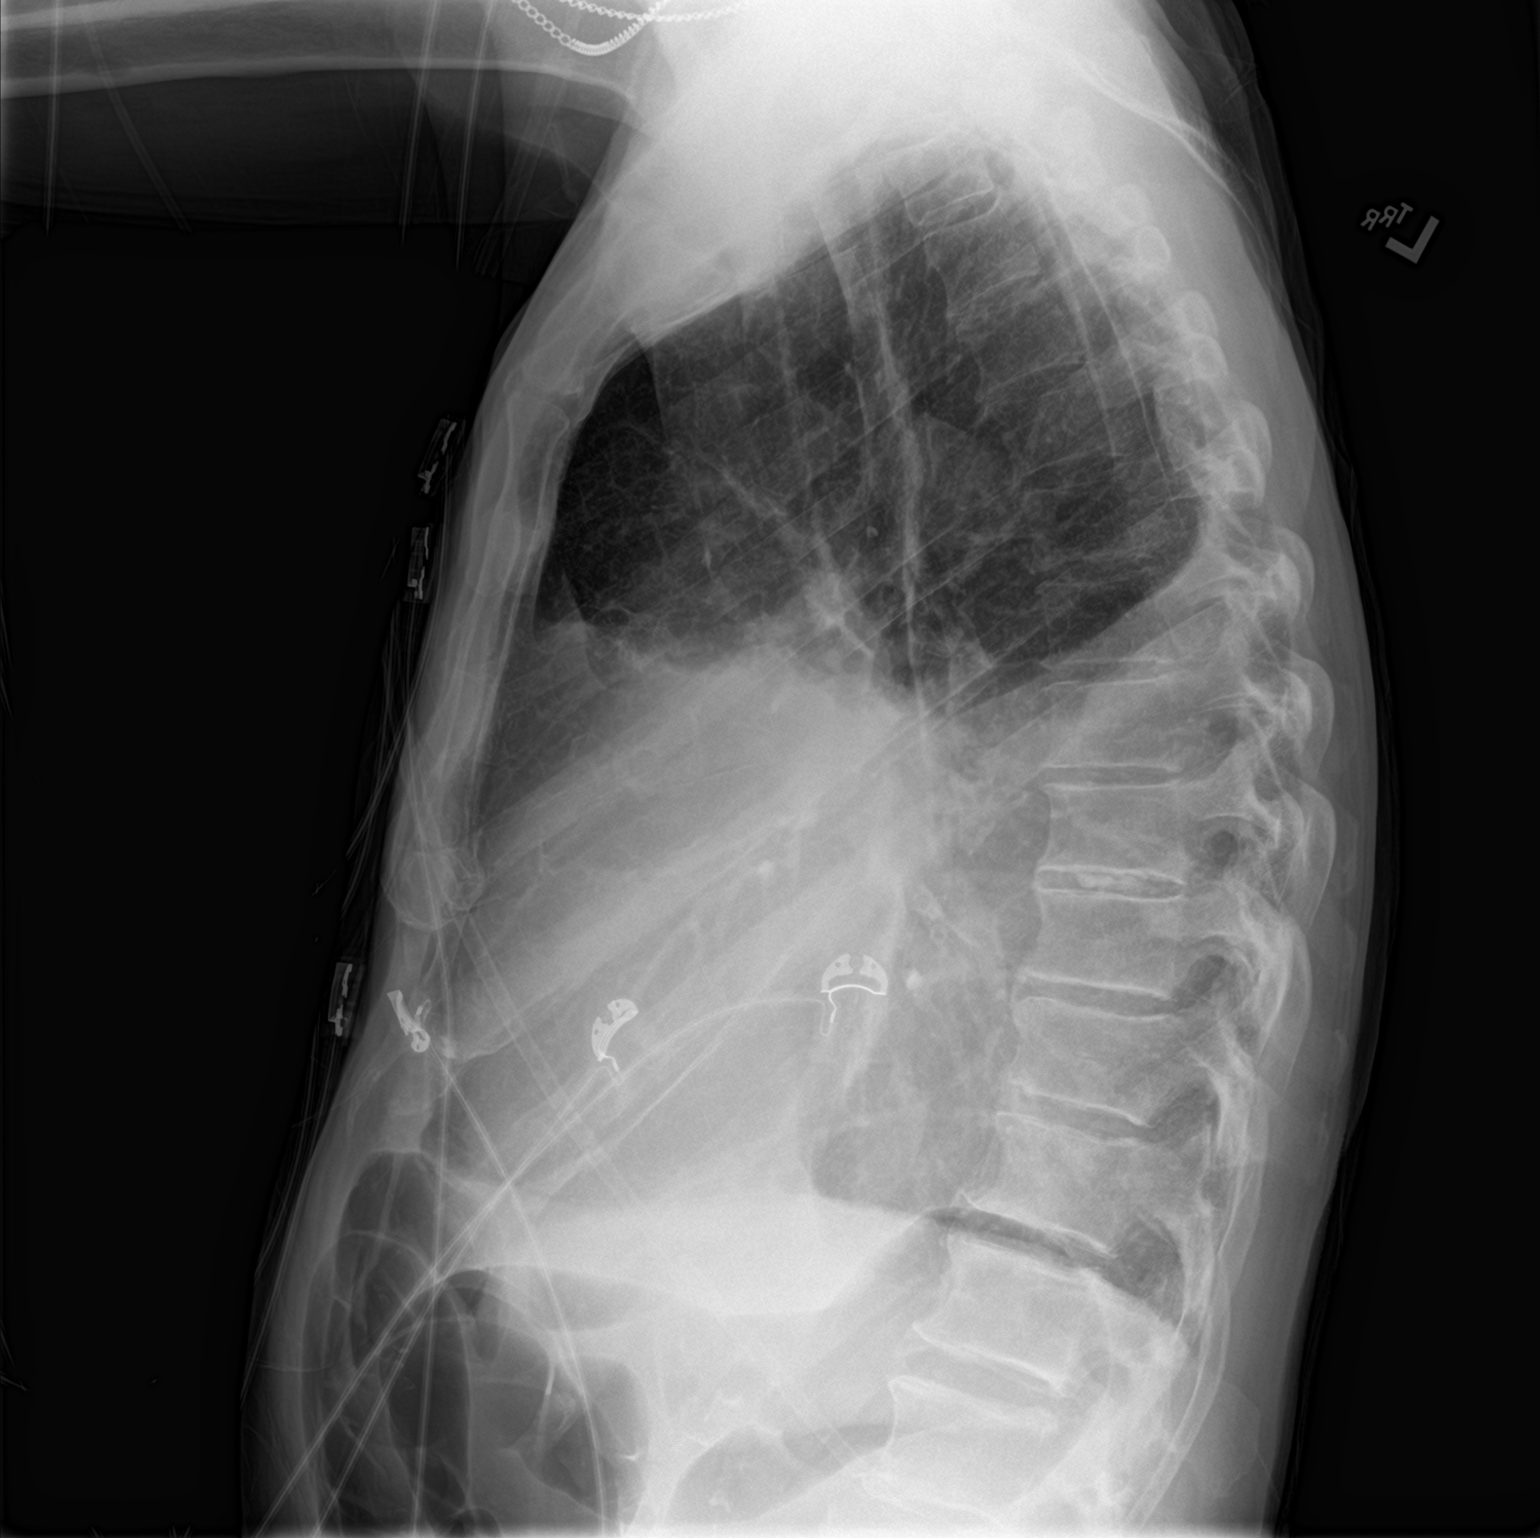

[2 of 2 positions shown; findings below may reference images not displayed]

FINDINGS: Frontal and lateral views of the chest demonstrate increased left
pleural effusion and left basilar consolidation. Right chest is
clear. No pneumothorax. Cardiac silhouette is obscured by the
consolidation and effusion at the left lung base. No acute bony
abnormalities.
IMPRESSION: 1. Progressive left basilar consolidation and effusion.

## 2021-10-31 IMAGING — CT CT CHEST W/ CM
2 of 3 series · 13 of 30 positions shown, 16 images · IV contrast (omnipaque)
Comparison: PET-CT of 08/29/2019

CLINICAL DATA: Restaging left lower lobe small cell lung cancer.
Two interval cycles of chemotherapy.

EXAM:
CT CHEST, ABDOMEN, AND PELVIS WITH CONTRAST
TECHNIQUE: Multidetector CT imaging of the chest, abdomen and pelvis was
performed following the standard protocol during bolus
administration of intravenous contrast.
CONTRAST:  100mL OMNIPAQUE IOHEXOL 300 MG/ML  SOLN

[Series 2: cap with · axial · 0.71mm/px · z∈[-964,-414]mm · 11 of 136 slices shown, 14 images]
[im 13/136  mediastinal]
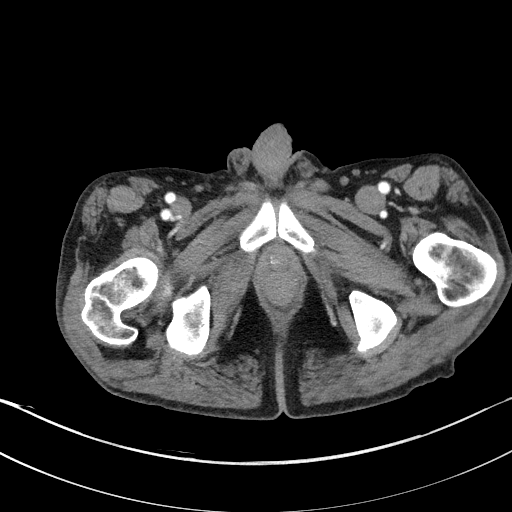
[im 13/136  lung]
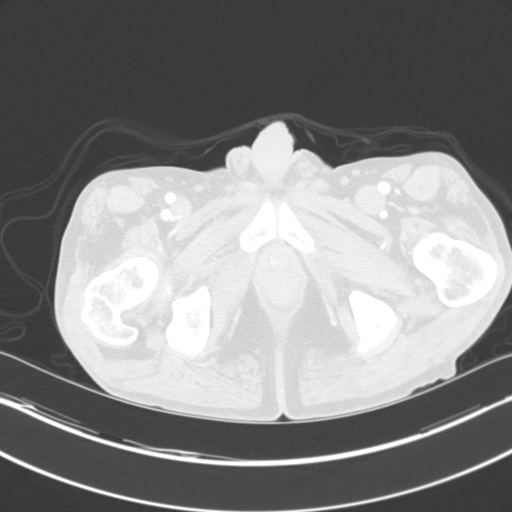
[im 25/136  lung]
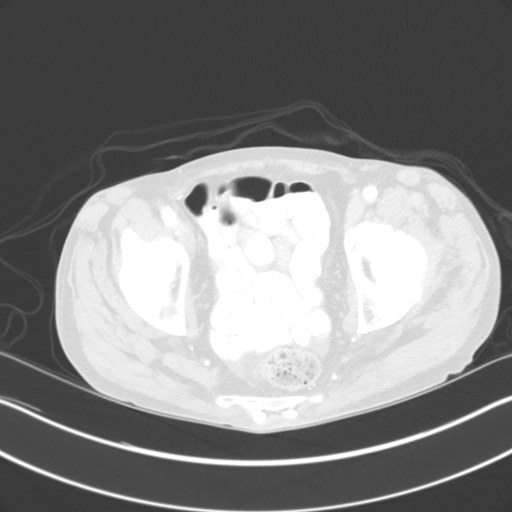
[im 37/136  lung]
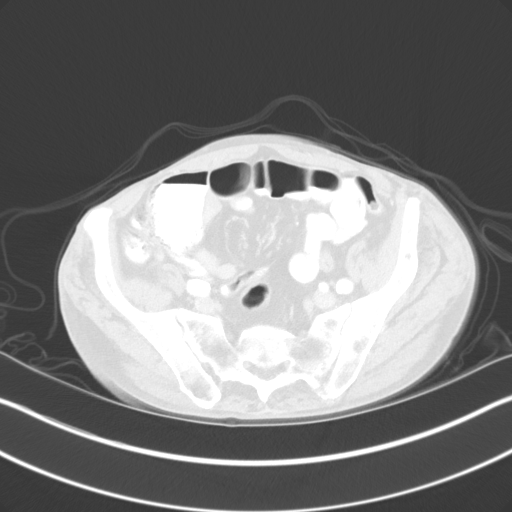
[im 50/136  lung]
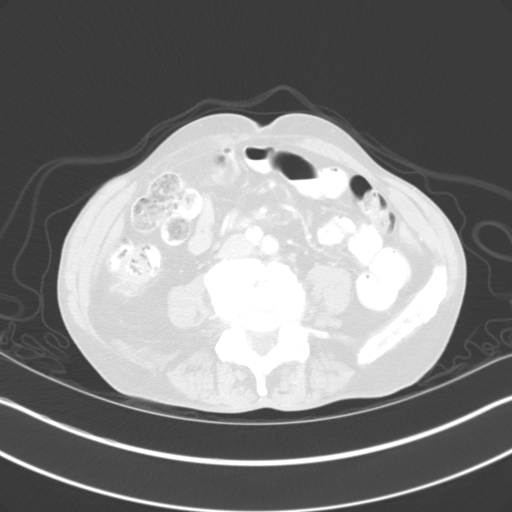
[im 62/136  mediastinal]
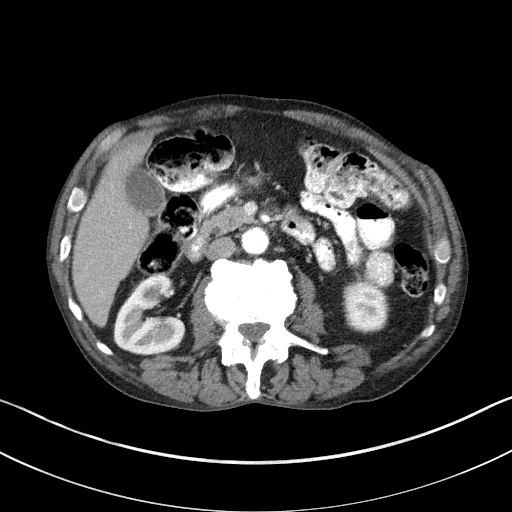
[im 62/136  lung]
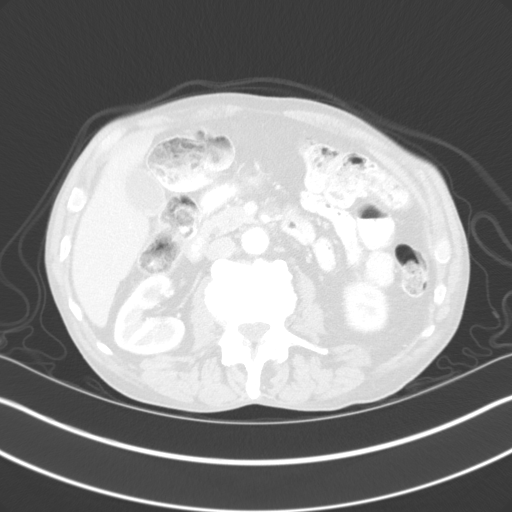
[im 67/136  lung]
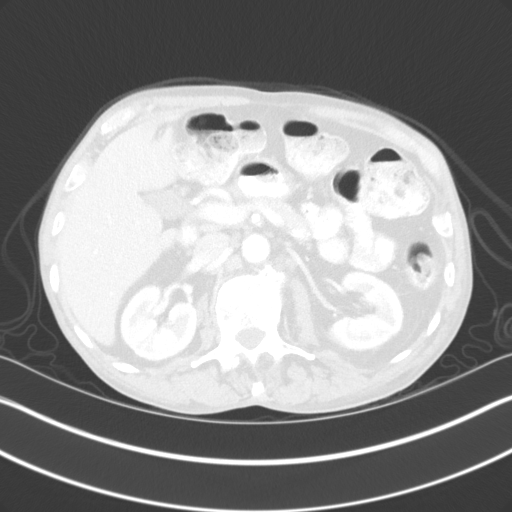
[im 74/136  lung]
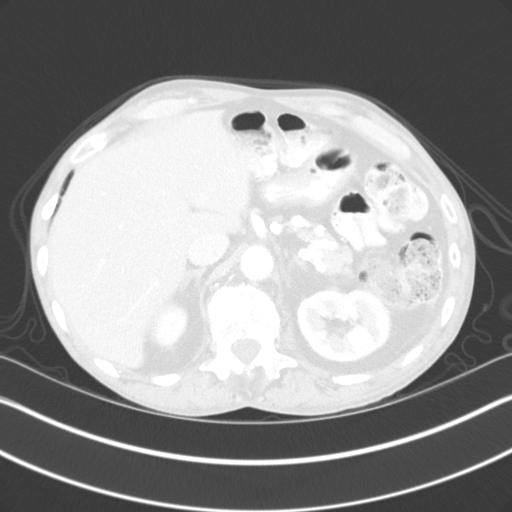
[im 86/136  lung]
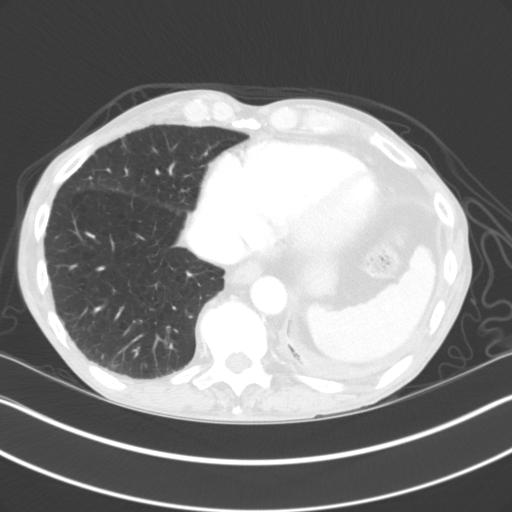
[im 99/136  mediastinal]
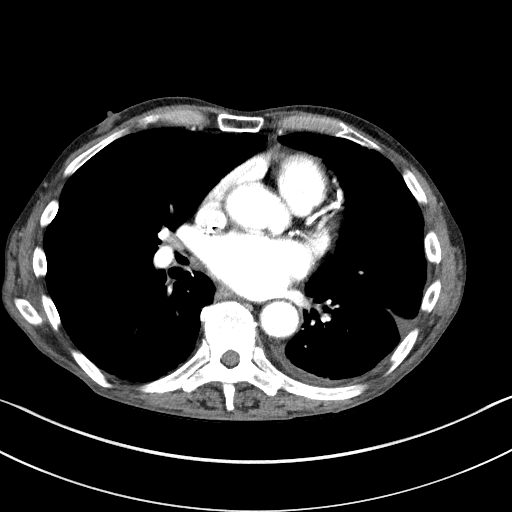
[im 99/136  lung]
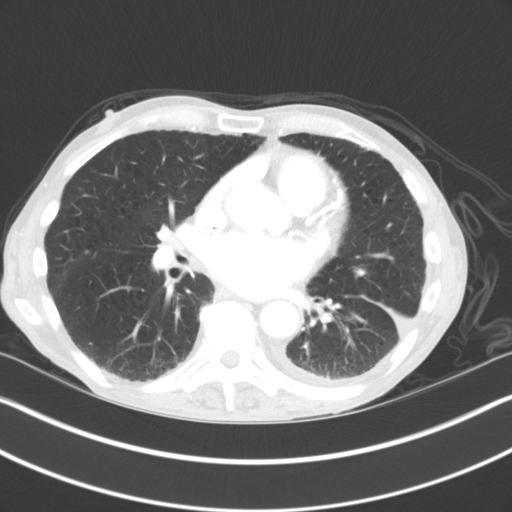
[im 111/136  lung]
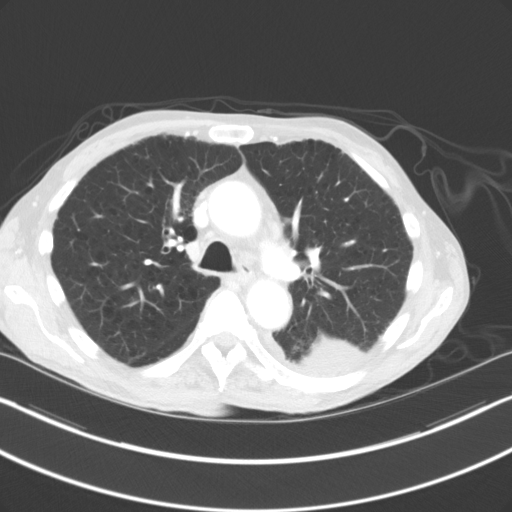
[im 123/136  lung]
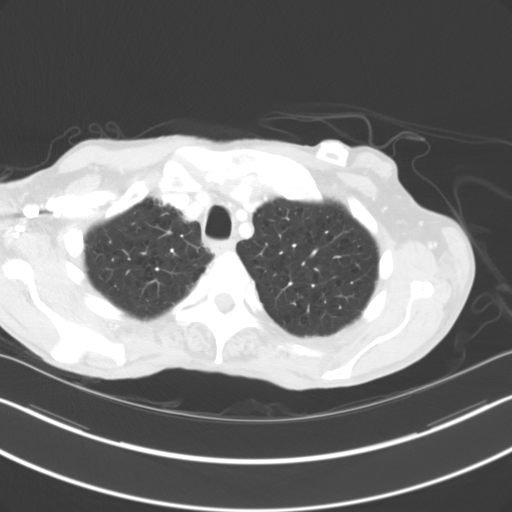

[Series 4: lung · axial · 0.71mm/px · z∈[-646,-592]mm · 2 of 162 slices shown]
[im 14/162  lung]
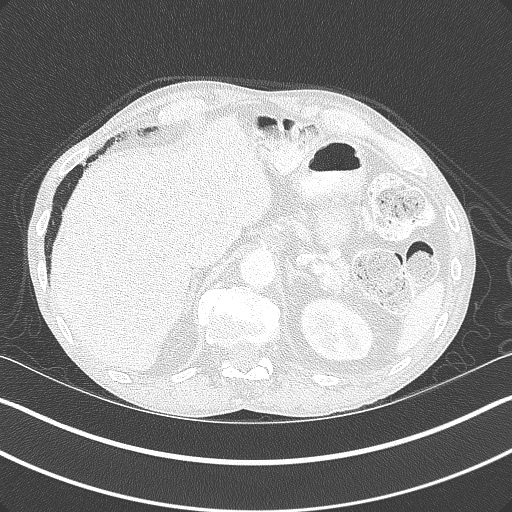
[im 41/162  lung]
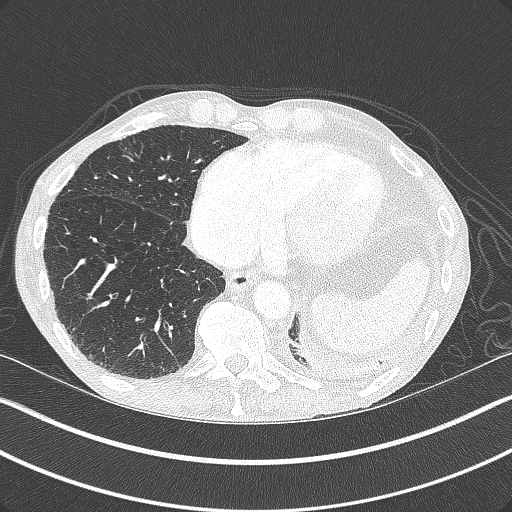

[13 of 30 positions shown; findings below may reference images not displayed]

FINDINGS: CT CHEST FINDINGS

Cardiovascular: Left Port-A-Cath tip: Right atrium.

Coronary, aortic arch, and branch vessel atherosclerotic vascular
disease.

Mediastinum/Nodes: Previously hypermetabolic subcarinal lymph node
measures 0.8 cm in short axis on image 32/2, formerly 2.0 cm on
08/29/2019.

Previously hypermetabolic AP window lymph node measures 0.4 cm in
short axis on image [DATE], previously 0.9 cm. Previously
hypermetabolic prevascular lymph nodes have resolved. Previously
hypermetabolic left infrahilar lymph node measures 0.8 cm in short
axis on image 34/2, formerly 1.7 cm. Previously hypermetabolic right
para-aortic lymph node near the hiatus measures 0.8 cm in short axis
on image 54/2, previously 1.8 cm. No new or enlarging adenopathy
identified.

Lungs/Pleura: Notably reduced size of the left pleural effusion
although some complexity or loculation likely persists. There is
previously hypermetabolic activity in the left effusion indicating
malignant effusion. Previous 6 areas of hypermetabolic nodularity
are not obviously nodular along the pleural surface today although
this does not completely exclude residual pleural malignancy.

In the area of prior consolidation and suspected mass in the
collapsed left lower lobe, there is currently some continued volume
loss although the degree of volume loss and opacity in this region
is markedly improved. It is difficult to measure an underlying mass
given the surrounding volume loss, but the appearance is clearly
substantially improved.

Centrilobular emphysema. Biapical pleuroparenchymal scarring.
Subpleural reticulation and scattered mild subpleural scarring. Mild
airway thickening.

Musculoskeletal: Chronic deformity of the left medial clavicle
likely due to an old fracture, with some deformity of the left
sternoclavicular joint. This region was not previously abnormally
hypermetabolic. Lower thoracic spondylosis is present.

CT ABDOMEN PELVIS FINDINGS

Hepatobiliary: Unremarkable

Pancreas: Unremarkable

Spleen: Unremarkable

Adrenals/Urinary Tract: Adrenal glands unremarkable. Right mid
kidney benign-appearing cyst. Otherwise unremarkable.

Stomach/Bowel: Diverticula of the transverse duodenum without
findings of inflammation. Borderline appearance for prominence stool
in the colon.

Vascular/Lymphatic: Aortoiliac atherosclerotic vascular disease.
Marked reduction in size of prior hypermetabolic lymph nodes at the
hiatus, celiac trunk, and upper retroperitoneum. For example, a left
para para-aortic node on image 67/2 measures 0.9 cm in short axis,
previously 1.9 cm. A left celiac node measures 0.5 cm in short axis,
formerly 1.9 cm. No new adenopathy.

Reproductive: Unremarkable

Other: Trace pelvic ascites although improved from prior.

Musculoskeletal: Previously hypermetabolic left upper posterior
acetabular lesion is poorly seen on today's exam. Similarly the left
ischial tuberosity lesion is not well appreciated. At L5 there are
several lucencies postoperative findings at the lumbosacral junction
and at the thoracolumbar junction. Lower lumbar spondylosis and
degenerative disc disease.
IMPRESSION: 1. Considerable reduction in size of prior hypermetabolic lymph
nodes in the chest and abdomen, compatible with response to therapy.
2. Notably reduced size of the left pleural effusion although some
complexity or loculation persists. Previous areas of hypermetabolic
nodularity along the pleural surface are not obviously nodular along
the pleural surface today although this does not completely exclude
residual pleural malignancy.
3. Considerable reduction in size of the previously
consolidated/collapsed left lower lobe, although there is still some
residual indistinct opacity in this region. Given the morphology and
indistinct margins it is difficult to arrive at a specific
underlying left lower lobe lesion size but overall the appearance is
substantially improved.
4. Previous hypermetabolic osseous metastatic lesions are relatively
occult on today's CT.
5. Other imaging findings of potential clinical significance:
Coronary atherosclerosis. Mild airway thickening is present,
suggesting bronchitis or reactive airways disease. Trace pelvic
ascites although improved from prior. Borderline appearance for
prominence stool in the colon. Chronic deformity of the left medial
clavicle likely due to an old fracture with some deformity of the
left sternoclavicular joint. Lower lumbar spondylosis and
degenerative disc disease.
6. Emphysema and aortic atherosclerosis.

Aortic Atherosclerosis (LBNBK-PM8.8) and Emphysema (LBNBK-AIV.3).
